# Patient Record
Sex: Female | Born: 1960 | Race: White | Hispanic: No | Marital: Married | Smoking: Never smoker
Health system: Southern US, Community
[De-identification: ages and names within clinical notes are randomized; demographics above are authoritative.]

## PROBLEM LIST (undated history)

## (undated) DIAGNOSIS — F329 Major depressive disorder, single episode, unspecified: Secondary | ICD-10-CM

## (undated) DIAGNOSIS — R7303 Prediabetes: Secondary | ICD-10-CM

## (undated) DIAGNOSIS — J45909 Unspecified asthma, uncomplicated: Secondary | ICD-10-CM

## (undated) DIAGNOSIS — F32A Depression, unspecified: Secondary | ICD-10-CM

## (undated) DIAGNOSIS — I509 Heart failure, unspecified: Secondary | ICD-10-CM

## (undated) DIAGNOSIS — E039 Hypothyroidism, unspecified: Secondary | ICD-10-CM

## (undated) DIAGNOSIS — C801 Malignant (primary) neoplasm, unspecified: Secondary | ICD-10-CM

## (undated) DIAGNOSIS — F419 Anxiety disorder, unspecified: Secondary | ICD-10-CM

## (undated) DIAGNOSIS — I10 Essential (primary) hypertension: Secondary | ICD-10-CM

## (undated) DIAGNOSIS — K635 Polyp of colon: Secondary | ICD-10-CM

## (undated) DIAGNOSIS — E079 Disorder of thyroid, unspecified: Secondary | ICD-10-CM

## (undated) DIAGNOSIS — Z9889 Other specified postprocedural states: Secondary | ICD-10-CM

## (undated) DIAGNOSIS — E209 Hypoparathyroidism, unspecified: Secondary | ICD-10-CM

## (undated) DIAGNOSIS — M797 Fibromyalgia: Secondary | ICD-10-CM

## (undated) DIAGNOSIS — D509 Iron deficiency anemia, unspecified: Secondary | ICD-10-CM

## (undated) DIAGNOSIS — F319 Bipolar disorder, unspecified: Secondary | ICD-10-CM

## (undated) DIAGNOSIS — Z8489 Family history of other specified conditions: Secondary | ICD-10-CM

## (undated) DIAGNOSIS — R0600 Dyspnea, unspecified: Secondary | ICD-10-CM

## (undated) DIAGNOSIS — R112 Nausea with vomiting, unspecified: Secondary | ICD-10-CM

## (undated) DIAGNOSIS — R9431 Abnormal electrocardiogram [ECG] [EKG]: Secondary | ICD-10-CM

## (undated) DIAGNOSIS — E538 Deficiency of other specified B group vitamins: Secondary | ICD-10-CM

## (undated) DIAGNOSIS — Z8719 Personal history of other diseases of the digestive system: Secondary | ICD-10-CM

## (undated) DIAGNOSIS — R590 Localized enlarged lymph nodes: Secondary | ICD-10-CM

## (undated) DIAGNOSIS — R918 Other nonspecific abnormal finding of lung field: Secondary | ICD-10-CM

## (undated) DIAGNOSIS — Z85820 Personal history of malignant melanoma of skin: Secondary | ICD-10-CM

## (undated) HISTORY — PX: AUGMENTATION MAMMAPLASTY: SUR837

## (undated) HISTORY — DX: Major depressive disorder, single episode, unspecified: F32.9

## (undated) HISTORY — DX: Depression, unspecified: F32.A

## (undated) HISTORY — DX: Heart failure, unspecified: I50.9

## (undated) HISTORY — PX: NASAL SINUS SURGERY: SHX719

## (undated) HISTORY — DX: Deficiency of other specified B group vitamins: E53.8

## (undated) HISTORY — PX: SKIN CANCER EXCISION: SHX779

## (undated) HISTORY — DX: Disorder of thyroid, unspecified: E07.9

## (undated) HISTORY — PX: APPENDECTOMY: SHX54

## (undated) HISTORY — DX: Malignant (primary) neoplasm, unspecified: C80.1

## (undated) HISTORY — DX: Anxiety disorder, unspecified: F41.9

## (undated) HISTORY — PX: BREAST ENHANCEMENT SURGERY: SHX7

## (undated) HISTORY — PX: HEMORRHOID SURGERY: SHX153

---

## 2004-05-18 ENCOUNTER — Ambulatory Visit: Payer: Self-pay | Admitting: Internal Medicine

## 2004-05-26 ENCOUNTER — Ambulatory Visit: Payer: Self-pay | Admitting: Internal Medicine

## 2004-09-12 ENCOUNTER — Ambulatory Visit: Payer: Self-pay | Admitting: Gastroenterology

## 2004-10-03 ENCOUNTER — Ambulatory Visit: Payer: Self-pay | Admitting: Obstetrics and Gynecology

## 2005-07-13 ENCOUNTER — Ambulatory Visit: Payer: Self-pay | Admitting: Surgery

## 2006-01-11 ENCOUNTER — Ambulatory Visit: Payer: Self-pay | Admitting: Obstetrics and Gynecology

## 2007-02-27 ENCOUNTER — Ambulatory Visit: Payer: Self-pay | Admitting: Obstetrics and Gynecology

## 2007-10-11 ENCOUNTER — Ambulatory Visit: Payer: Self-pay | Admitting: Unknown Physician Specialty

## 2008-04-02 ENCOUNTER — Ambulatory Visit: Payer: Self-pay | Admitting: Internal Medicine

## 2008-05-19 ENCOUNTER — Ambulatory Visit: Payer: Self-pay | Admitting: Unknown Physician Specialty

## 2008-07-17 DIAGNOSIS — Z8614 Personal history of Methicillin resistant Staphylococcus aureus infection: Secondary | ICD-10-CM

## 2008-07-17 HISTORY — DX: Personal history of Methicillin resistant Staphylococcus aureus infection: Z86.14

## 2009-11-01 ENCOUNTER — Ambulatory Visit: Payer: Self-pay | Admitting: Gastroenterology

## 2010-07-17 HISTORY — PX: ANKLE SURGERY: SHX546

## 2010-12-06 ENCOUNTER — Ambulatory Visit: Payer: Self-pay

## 2011-05-10 ENCOUNTER — Ambulatory Visit: Payer: Self-pay

## 2011-05-10 ENCOUNTER — Ambulatory Visit: Payer: Self-pay | Admitting: Podiatry

## 2011-05-17 ENCOUNTER — Ambulatory Visit: Payer: Self-pay | Admitting: Podiatry

## 2011-08-28 ENCOUNTER — Ambulatory Visit: Payer: Self-pay | Admitting: Family Medicine

## 2011-08-28 ENCOUNTER — Ambulatory Visit: Payer: Self-pay | Admitting: Surgery

## 2011-08-28 LAB — CBC WITH DIFFERENTIAL/PLATELET
Basophil %: 0 %
Eosinophil #: 0 10*3/uL (ref 0.0–0.7)
Eosinophil %: 0 %
HCT: 35.3 % (ref 35.0–47.0)
HGB: 12.1 g/dL (ref 12.0–16.0)
Lymphocyte %: 3.4 %
MCHC: 34.4 g/dL (ref 32.0–36.0)
Monocyte #: 0.4 10*3/uL (ref 0.0–0.7)
Neutrophil %: 92 %
RBC: 3.75 10*6/uL — ABNORMAL LOW (ref 3.80–5.20)
WBC: 9 10*3/uL (ref 3.6–11.0)

## 2011-08-28 LAB — URINALYSIS, COMPLETE
Bacteria: NONE SEEN
Bilirubin,UR: NEGATIVE
Blood: NEGATIVE
Glucose,UR: NEGATIVE mg/dL (ref 0–75)
RBC,UR: <1 /HPF (ref 0–5)
Squamous Epithelial: <1

## 2011-08-28 LAB — BASIC METABOLIC PANEL
Anion Gap: 10 (ref 7–16)
BUN: 11 mg/dL (ref 7–18)
Calcium, Total: 8.3 mg/dL — ABNORMAL LOW (ref 8.5–10.1)
Chloride: 99 mmol/L (ref 98–107)
EGFR (Non-African Amer.): >60
Glucose: 114 mg/dL — ABNORMAL HIGH (ref 65–99)
Osmolality: 265 (ref 275–301)

## 2011-08-29 LAB — BASIC METABOLIC PANEL
BUN: 8 mg/dL (ref 7–18)
Calcium, Total: 7.9 mg/dL — ABNORMAL LOW (ref 8.5–10.1)
Chloride: 101 mmol/L (ref 98–107)
Co2: 26 mmol/L (ref 21–32)
Creatinine: 0.61 mg/dL (ref 0.60–1.30)
EGFR (Non-African Amer.): >60
Osmolality: 271 (ref 275–301)
Potassium: 3.6 mmol/L (ref 3.5–5.1)
Sodium: 136 mmol/L (ref 136–145)

## 2011-08-29 LAB — CBC WITH DIFFERENTIAL/PLATELET
Basophil %: 0.1 %
Eosinophil %: 0 %
HGB: 12.2 g/dL (ref 12.0–16.0)
Lymphocyte %: 4.3 %
MCH: 32.4 pg (ref 26.0–34.0)
Monocyte #: 0.5 10*3/uL (ref 0.0–0.7)
Neutrophil %: 89.9 %
RBC: 3.75 10*6/uL — ABNORMAL LOW (ref 3.80–5.20)
RDW: 12.8 % (ref 11.5–14.5)

## 2011-09-14 ENCOUNTER — Ambulatory Visit: Payer: Self-pay | Admitting: Surgery

## 2011-09-14 LAB — CBC WITH DIFFERENTIAL/PLATELET
Basophil #: 0 10*3/uL (ref 0.0–0.1)
Eosinophil #: 0.2 10*3/uL (ref 0.0–0.7)
HCT: 38.4 % (ref 35.0–47.0)
Lymphocyte #: 1 10*3/uL (ref 1.0–3.6)
MCH: 31.8 pg (ref 26.0–34.0)
MCHC: 33.5 g/dL (ref 32.0–36.0)
MCV: 95 fL (ref 80–100)
Monocyte #: 0.4 10*3/uL (ref 0.0–0.7)
Monocyte %: 6.2 %
Neutrophil #: 5.1 10*3/uL (ref 1.4–6.5)
Platelet: 323 10*3/uL (ref 150–440)
RDW: 12.6 % (ref 11.5–14.5)

## 2011-09-14 LAB — BASIC METABOLIC PANEL
BUN: 18 mg/dL (ref 7–18)
Calcium, Total: 8.5 mg/dL (ref 8.5–10.1)
EGFR (Non-African Amer.): >60
Glucose: 103 mg/dL — ABNORMAL HIGH (ref 65–99)
Osmolality: 280 (ref 275–301)
Potassium: 4.1 mmol/L (ref 3.5–5.1)

## 2011-09-14 LAB — HEPATIC FUNCTION PANEL A (ARMC)
Albumin: 3.9 g/dL (ref 3.4–5.0)
Bilirubin, Direct: 0.1 mg/dL (ref 0.00–0.20)
Bilirubin,Total: 0.3 mg/dL (ref 0.2–1.0)
SGOT(AST): 29 U/L (ref 15–37)
Total Protein: 7.6 g/dL (ref 6.4–8.2)

## 2011-09-14 LAB — LIPASE, BLOOD: Lipase: 210 U/L (ref 73–393)

## 2011-10-30 ENCOUNTER — Ambulatory Visit: Payer: Self-pay | Admitting: Specialist

## 2012-02-29 ENCOUNTER — Ambulatory Visit: Payer: Self-pay | Admitting: Obstetrics and Gynecology

## 2013-01-23 ENCOUNTER — Ambulatory Visit: Payer: Self-pay | Admitting: Specialist

## 2013-01-28 ENCOUNTER — Telehealth: Payer: Self-pay | Admitting: Endocrinology

## 2013-01-28 NOTE — Telephone Encounter (Signed)
I do not have the report available, please let me know when previous records come over, when his her next appointment? She will need BMP and vitamin D levels

## 2013-01-28 NOTE — Telephone Encounter (Signed)
Needs bone density results, done in June. Would also like order mailed to her for labs, she can get them done at a [facility] near her home. Please call # [phone]. Thanks! Sherri

## 2013-01-28 NOTE — Telephone Encounter (Signed)
Dr. Kumar please advise. 

## 2013-01-29 NOTE — Telephone Encounter (Signed)
Pt has not signed a release form yet, I gave her the number to have one sent to her so we can get her records, she has an appt scheduled with you on July 29th,  She wanted lab orders sent to her so she can go to [facility], I told her we needed her previous records before we can order those.

## 2013-01-30 ENCOUNTER — Ambulatory Visit: Payer: Self-pay | Admitting: Specialist

## 2013-02-11 ENCOUNTER — Ambulatory Visit: Payer: Self-pay | Admitting: Endocrinology

## 2013-02-11 ENCOUNTER — Other Ambulatory Visit: Payer: Self-pay | Admitting: Endocrinology

## 2013-02-11 NOTE — Telephone Encounter (Signed)
Pharmacy requests refill of Calcitriol 0.25 mg, okay to fill?

## 2013-02-14 ENCOUNTER — Encounter: Payer: Self-pay | Admitting: Endocrinology

## 2013-02-14 ENCOUNTER — Ambulatory Visit (INDEPENDENT_AMBULATORY_CARE_PROVIDER_SITE_OTHER): Payer: BC Managed Care – PPO | Admitting: Endocrinology

## 2013-02-14 VITALS — BP 110/78 | HR 97 | Temp 98.0°F | Resp 10 | Ht 64.0 in | Wt 159.6 lb

## 2013-02-14 DIAGNOSIS — E209 Hypoparathyroidism, unspecified: Secondary | ICD-10-CM

## 2013-02-14 LAB — BASIC METABOLIC PANEL
BUN: 16 mg/dL (ref 6–23)
Calcium: 8.6 mg/dL (ref 8.4–10.5)
Chloride: 103 mEq/L (ref 96–112)
Creatinine, Ser: 0.9 mg/dL (ref 0.4–1.2)
GFR: 73.24 mL/min (ref >60.00)

## 2013-02-14 NOTE — Progress Notes (Signed)
Patient ID: Kaylee Gordon, female   DOB: 27-Nov-1960, 52 y.o.   MRN: [id number]  History of Present Illness:  Chief complaint: Followup of Hypoparathyroidism    Has had hypocalcemia since she was 52 years old and presented with symptoms of cramping in her hands along with twitching of her face and eyes. She was diagnosed at [facility] as having primary idiopathic hypoparathyroidism.   She has not been seen in followup since 10/2012  The patient is complaining of tiredness which is not new.  On her last visit she had been noncompliant with her calcium and calcitriol and her calcium level was 7.7. Did not have any muscle cramps though.  Her last visit she thinks she has been more compliant with her calcitriol daily and also her calcium 2400 mg. She has less constipation with current regimen of calcium, previously was taking 3600 mg  Problem 2: She was concerned about her having shrunk 2 inches in height and a bone density was checked. However she has only mild osteopenia with T score -1.2 at the right hip    Medication List       This list is accurate as of: 02/14/13 11:59 PM.  Always use your most recent med list.               calcitRIOL 0.25 MCG capsule  Commonly known as:  ROCALTROL  0.25 mcg.     chlorpheniramine-HYDROcodone 10-8 MG/5ML Lqcr  Commonly known as:  TUSSIONEX     diazepam 5 MG tablet  Commonly known as:  VALIUM  5 mg.     fluticasone 50 MCG/ACT nasal spray  Commonly known as:  FLONASE     HYDROcodone-acetaminophen 5-325 MG per tablet  Commonly known as:  NORCO/VICODIN     levothyroxine 50 MCG tablet  Commonly known as:  SYNTHROID, LEVOTHROID  Take 50 mcg by mouth daily before breakfast.     QUEtiapine 25 MG tablet  Commonly known as:  SEROQUEL  25 mg.     ranitidine 150 MG tablet  Commonly known as:  ZANTAC  150 mg.     venlafaxine XR 75 MG 24 hr capsule  Commonly known as:  EFFEXOR-XR  75 mg.        Allergies:  Allergies  Allergen  Reactions  . Biaxin (Clarithromycin)   . Parafon Forte Dsc (Chlorzoxazone)   . Sudafed (Pseudoephedrine Hcl)     No past medical history on file.  Past Surgical History  Procedure Laterality Date  . Appendectomy    . Breast enhancement surgery      Family History  Problem Relation Age of Onset  . Cancer Mother   . Heart disease Father   . Hypoparathyroidism Son     Social History:  reports that she has never smoked. She does not have any smokeless tobacco history on file. Her alcohol and drug histories are not on file.  Review of Systems -   She has a history of hypertension Previously was having hypokalemia and she thinks HCTZ was stopped, now off medications History of depression Has history of allergic rhinitis Has history of asthma ? Hypothyroidism: Her TSH has been normal with stopping her thyroid supplement. Not clear if she was started on thyroxine supplements with a borderline TSH levels about 2 years ago  Office Visit on 02/14/2013  Component Date Value Range Status  . Sodium 02/14/2013 136  135 - 145 mEq/L Final  . Potassium 02/14/2013 3.5  3.5 - 5.1 mEq/L Final  .  Chloride 02/14/2013 103  96 - 112 mEq/L Final  . CO2 02/14/2013 26  19 - 32 mEq/L Final  . Glucose, Bld 02/14/2013 100* 70 - 99 mg/dL Final  . BUN 96/10/5407 16  6 - 23 mg/dL Final  . Creatinine, Ser 02/14/2013 0.9  0.4 - 1.2 mg/dL Final  . Calcium 81/19/1478 8.6  8.4 - 10.5 mg/dL Final  . GFR 29/56/2130 73.24  >60.00 mL/min Final    EXAM:  BP 110/78  Pulse 97  Temp(Src) 98 F (36.7 C)  Resp 10  Ht 5\' 4"  (1.626 m)  Wt 159 lb 9.6 oz (72.394 kg)  BMI 27.38 kg/m2  SpO2 95%  LMP 02/05/2013  Assessment/Plan:   HYPOPARATHYROIDISM: Her calcium appears to be back to normal and she is not having the symptoms with better compliance with her calcitriol daily and calcium supplements Emphasized the  need for consistent compliance  OSTEOPENIA: This is mild and does not need any specific treatment,  she is going to be on calcium and vitamin D supplements anyway  Augustine Brannick 02/17/2013, 2:38 PM

## 2013-02-14 NOTE — Patient Instructions (Addendum)
Taka calcium 2x daily

## 2013-02-17 ENCOUNTER — Encounter: Payer: Self-pay | Admitting: Endocrinology

## 2013-02-17 ENCOUNTER — Telehealth: Payer: Self-pay | Admitting: *Deleted

## 2013-02-17 MED ORDER — CALCITRIOL 0.25 MCG PO CAPS: 0.2500 ug | ORAL_CAPSULE | Freq: Every day | ORAL | Status: DC

## 2013-02-17 NOTE — Telephone Encounter (Signed)
Message copied by Hermenia Bers on Mon Feb 17, 2013  9:15 AM ------      Message from: Reather Littler      Created: Sat Feb 15, 2013  6:42 PM       Normal ------

## 2013-02-17 NOTE — Telephone Encounter (Signed)
Home number is wrong, left message on cell phone to return call

## 2013-02-17 NOTE — Telephone Encounter (Signed)
Pt is aware of results. 

## 2013-02-17 NOTE — Telephone Encounter (Signed)
Message copied by Hermenia Bers on Mon Feb 17, 2013 11:52 AM ------      Message from: Reather Littler      Created: Sat Feb 15, 2013  6:42 PM       Normal ------

## 2013-02-20 ENCOUNTER — Ambulatory Visit: Payer: Self-pay | Admitting: Internal Medicine

## 2013-05-06 ENCOUNTER — Ambulatory Visit: Payer: Self-pay | Admitting: Family Medicine

## 2013-08-12 ENCOUNTER — Other Ambulatory Visit: Payer: Self-pay | Admitting: Endocrinology

## 2013-08-20 ENCOUNTER — Ambulatory Visit: Payer: BC Managed Care – PPO | Admitting: Endocrinology

## 2013-09-09 ENCOUNTER — Other Ambulatory Visit: Payer: Self-pay | Admitting: Endocrinology

## 2013-10-01 ENCOUNTER — Ambulatory Visit: Payer: BC Managed Care – PPO | Admitting: Endocrinology

## 2013-10-13 ENCOUNTER — Other Ambulatory Visit: Payer: Self-pay | Admitting: Endocrinology

## 2013-10-16 ENCOUNTER — Encounter: Payer: Self-pay | Admitting: Endocrinology

## 2013-10-16 ENCOUNTER — Ambulatory Visit (INDEPENDENT_AMBULATORY_CARE_PROVIDER_SITE_OTHER): Payer: BC Managed Care – PPO | Admitting: Endocrinology

## 2013-10-16 VITALS — BP 128/78 | HR 105 | Temp 97.8°F | Resp 16 | Ht 64.0 in | Wt 162.8 lb

## 2013-10-16 DIAGNOSIS — E209 Hypoparathyroidism, unspecified: Secondary | ICD-10-CM

## 2013-10-16 DIAGNOSIS — E7800 Pure hypercholesterolemia, unspecified: Secondary | ICD-10-CM

## 2013-10-16 NOTE — Patient Instructions (Signed)
Same doses

## 2013-10-16 NOTE — Progress Notes (Addendum)
Patient ID: Kaylee Gordon, female   DOB: Mar 04, 1961, 53 y.o.   MRN: [id number]   History of Present Illness:  Chief complaint: Followup of Hypoparathyroidism    Has had hypocalcemia since she was 53 years old and presented with symptoms of cramping in her hands along with twitching of her face and eyes. She was diagnosed at [facility] as having primary idiopathic hypoparathyroidism.   She has not been seen in followup since 02/2013  The patient is not complaining of any tingling in her face, muscle cramping or twitching of muscles. Has occasional tiredness which is not new.  Since her last visit she had been much better with her calcium and calcitriol and she feels better with consistent regimen She thinks her calcium was checked by her PCP about 2 weeks ago and the level was about 8.8 Unable to get her records today  Since her last visit she is taking calcitriol 0.25 mcg daily and also her calcium 1200 mg. twice a day      Medication List       This list is accurate as of: 10/16/13 11:59 PM.  Always use your most recent med list.               amoxicillin 500 MG capsule  Commonly known as:  AMOXIL  Take 500 mg by mouth 3 (three) times daily.     calcitRIOL 0.25 MCG capsule  Commonly known as:  ROCALTROL  TAKE 1 CAPSULE (0.25 MCG TOTAL) BY MOUTH DAILY.     chlorpheniramine-HYDROcodone 10-8 MG/5ML Lqcr  Commonly known as:  TUSSIONEX     diazepam 5 MG tablet  Commonly known as:  VALIUM  5 mg.     fluticasone 50 MCG/ACT nasal spray  Commonly known as:  FLONASE     HYDROcodone-acetaminophen 5-325 MG per tablet  Commonly known as:  NORCO/VICODIN     meloxicam 15 MG tablet  Commonly known as:  MOBIC     QUEtiapine 25 MG tablet  Commonly known as:  SEROQUEL  25 mg.     ranitidine 150 MG tablet  Commonly known as:  ZANTAC  150 mg.     venlafaxine XR 75 MG 24 hr capsule  Commonly known as:  EFFEXOR-XR  75 mg.     ZOSTAVAX 40981 UNT/0.65ML injection   Generic drug:  zoster vaccine live (PF)        Allergies:  Allergies  Allergen Reactions  . Biaxin [Clarithromycin]   . Parafon Forte Dsc [Chlorzoxazone]   . Sudafed [Pseudoephedrine Hcl]     No past medical history on file.  Past Surgical History  Procedure Laterality Date  . Appendectomy    . Breast enhancement surgery      Family History  Problem Relation Age of Onset  . Cancer Mother   . Heart disease Father   . Hypoparathyroidism Son     Social History:  reports that she has never smoked. She does not have any smokeless tobacco history on file. Her alcohol and drug histories are not on file.  Review of Systems -   She has a history of hypertension but is currently not taking any medications; Previously was having hypokalemia and HCTZ was stopped History of depression Menopause: she has only mild osteopenia with T score -1.2 at the right hip  ? Hypothyroidism: Her TSH has been normal with stopping her thyroid supplement.  Not clear if she was started on thyroxine supplements with a borderline TSH levels about 3 years  ago  HYPERCHOLESTEROLEMIA: She thinks her LDL was about 195. However had severe fatigue with Lipitor and has not taken any medications recently  LABS: Results pending   EXAM:  BP 128/78  Pulse 105  Temp(Src) 97.8 F (36.6 C)  Resp 16  Ht 5\' 4"  (1.626 m)  Wt 162 lb 12.8 oz (73.846 kg)  BMI 27.93 kg/m2  SpO2 98%  Negative [place]  Sign  Biceps reflexes are normal Neck exam shows no thyroid enlargement  Assessment/Plan:   HYPOPARATHYROIDISM: Her calcium appears to be back to normal although do not have her recent lab reports Currently she is not having the usual symptoms with better compliance with her calcitriol daily and calcium supplements Emphasized the  need for consistent compliance and followup in 6 months   Osborn Pullin 10/19/2013, 3:05 PM   Addendum: Labs received show calcium 8.8 and potassium 4.2 drawn on 09/29/13. LDL  168

## 2013-10-19 NOTE — Addendum Note (Signed)
Addended by: Elayne Snare on: 10/19/2013 03:12 PM   Modules accepted: Orders

## 2014-01-11 ENCOUNTER — Other Ambulatory Visit: Payer: Self-pay | Admitting: Endocrinology

## 2014-04-20 ENCOUNTER — Other Ambulatory Visit: Payer: BC Managed Care – PPO

## 2014-04-20 ENCOUNTER — Ambulatory Visit: Payer: BC Managed Care – PPO | Admitting: Endocrinology

## 2014-05-06 ENCOUNTER — Ambulatory Visit (INDEPENDENT_AMBULATORY_CARE_PROVIDER_SITE_OTHER): Payer: BC Managed Care – PPO | Admitting: Endocrinology

## 2014-05-06 ENCOUNTER — Other Ambulatory Visit: Payer: BC Managed Care – PPO

## 2014-05-06 ENCOUNTER — Encounter: Payer: Self-pay | Admitting: Endocrinology

## 2014-05-06 VITALS — BP 123/75 | HR 96 | Temp 97.8°F | Resp 14 | Ht 64.0 in | Wt 165.0 lb

## 2014-05-06 DIAGNOSIS — E209 Hypoparathyroidism, unspecified: Secondary | ICD-10-CM

## 2014-05-06 DIAGNOSIS — R5383 Other fatigue: Secondary | ICD-10-CM

## 2014-05-06 DIAGNOSIS — E038 Other specified hypothyroidism: Secondary | ICD-10-CM

## 2014-05-06 DIAGNOSIS — E7800 Pure hypercholesterolemia, unspecified: Secondary | ICD-10-CM

## 2014-05-06 NOTE — Progress Notes (Signed)
Patient ID: Kaylee Gordon, female   DOB: Aug 04, 1960, 53 y.o.   MRN: [id number]   History of Present Illness:  Chief complaint: Followup of Hypoparathyroidism    Has had hypocalcemia since she was 53 years old and presented with symptoms of cramping in her hands along with twitching of her face and eyes. She was diagnosed at [facility] as having primary idiopathic hypoparathyroidism.   She has not been seen in followup since 02/2013  The patient is not complaining of any tingling in her face, muscle cramping or twitching of muscles. Has continued tiredness which is not new.  In 2015 she had been much better with taking her calcium and calcitriol every day and she feels better with the consistent regimen She thinks her calcium was checked by her PCP about 3 weeks ago and the level was 9.6 compared to 8.8 previously  Since her last visit she is taking calcitriol 0.25 mcg daily and also her calcium 1200 mg. twice a day      Medication List       This list is accurate as of: 05/06/14  4:47 PM.  Always use your most recent med list.               calcitRIOL 0.25 MCG capsule  Commonly known as:  ROCALTROL  TAKE 1 CAPSULE (0.25 MCG TOTAL) BY MOUTH DAILY.     celecoxib 200 MG capsule  Commonly known as:  CELEBREX  Take 200 mg by mouth 2 (two) times daily.     diazepam 5 MG tablet  Commonly known as:  VALIUM  5 mg.     fluticasone 50 MCG/ACT nasal spray  Commonly known as:  FLONASE     QUEtiapine 25 MG tablet  Commonly known as:  SEROQUEL  25 mg.     venlafaxine XR 75 MG 24 hr capsule  Commonly known as:  EFFEXOR-XR  75 mg.     ZOSTAVAX 09326 UNT/0.65ML injection  Generic drug:  zoster vaccine live (PF)        Allergies:  Allergies  Allergen Reactions  . Biaxin [Clarithromycin]   . Parafon Forte Dsc [Chlorzoxazone]   . Sudafed [Pseudoephedrine Hcl]     No past medical history on file.  Past Surgical History  Procedure Laterality Date  . Appendectomy     . Breast enhancement surgery      Family History  Problem Relation Age of Onset  . Cancer Mother   . Heart disease Father   . Hypoparathyroidism Son     Social History:  reports that she has never smoked. She does not have any smokeless tobacco history on file. Her alcohol and drug histories are not on file.  Review of Systems -   She has a history of hypertension and is currently low dose lisinopril Previously was having hypokalemia and HCTZ was stopped History of depression, is on Seroquel low dose  Menopause: she had a normal FSH, since she had an endometrial ablation she does not know if he is having any instilled cycles  History of only mild osteopenia with T score -1.2 at the right hip  ? Hypothyroidism: Her TSH had been normal with stopping her thyroid supplement.  She was tried on thyroxine supplements with a borderline TSH levels in 2011  HYPERCHOLESTEROLEMIA:  She thinks her baseline LDL was about 195. However had severe fatigue with Lipitor and recent LDL is below 100 with taking low dose lovastatin   EXAM:  BP 123/75  Pulse  96  Temp(Src) 97.8 F (36.6 C)  Resp 14  Ht 5\' 4"  (1.626 m)  Wt 165 lb (74.844 kg)  BMI 28.31 kg/m2  SpO2 98%   Biceps reflexes are normal Neck exam shows no thyroid enlargement  Assessment/Plan:   HYPOPARATHYROIDISM: Her calcium is excellent now with current regimen of 0.25 mcg calcitriol and calcium 1200 mg a day She has been very consistent with her medication and is asymptomatic She will continue the same dose and followup in 6 months   Tayvian Holycross 05/06/2014, 4:47 PM   Addendum: Free T4 is low at 0.53, 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

## 2014-05-07 ENCOUNTER — Other Ambulatory Visit: Payer: Self-pay | Admitting: *Deleted

## 2014-05-07 LAB — T4, FREE: Free T4: 0.53 ng/dL — ABNORMAL LOW (ref 0.60–1.60)

## 2014-05-07 LAB — TSH: TSH: 1.7 u[IU]/mL (ref 0.35–4.50)

## 2014-05-07 MED ORDER — LEVOTHYROXINE SODIUM 25 MCG PO TABS: 25.0000 ug | ORAL_TABLET | Freq: Every day | ORAL | Status: DC

## 2014-05-07 NOTE — Progress Notes (Signed)
Quick Note:  The thyroid level is mildly decreased but this is coming from the pituitary gland. Would like to try her on Synthroid 25 mcg daily and have her come back in 2 months; same-day lab is okay ______

## 2014-05-13 ENCOUNTER — Ambulatory Visit: Payer: Self-pay | Admitting: Obstetrics and Gynecology

## 2014-06-15 ENCOUNTER — Other Ambulatory Visit: Payer: Self-pay | Admitting: Endocrinology

## 2014-07-01 ENCOUNTER — Ambulatory Visit: Payer: BC Managed Care – PPO | Admitting: Endocrinology

## 2014-07-06 ENCOUNTER — Other Ambulatory Visit: Payer: Self-pay | Admitting: Endocrinology

## 2014-07-22 ENCOUNTER — Encounter: Payer: Self-pay | Admitting: Endocrinology

## 2014-07-22 ENCOUNTER — Ambulatory Visit (INDEPENDENT_AMBULATORY_CARE_PROVIDER_SITE_OTHER): Payer: BC Managed Care – PPO | Admitting: Endocrinology

## 2014-07-22 VITALS — BP 124/81 | HR 100 | Temp 98.7°F | Resp 14 | Ht 64.0 in | Wt 169.2 lb

## 2014-07-22 DIAGNOSIS — N912 Amenorrhea, unspecified: Secondary | ICD-10-CM

## 2014-07-22 DIAGNOSIS — E209 Hypoparathyroidism, unspecified: Secondary | ICD-10-CM

## 2014-07-22 DIAGNOSIS — E038 Other specified hypothyroidism: Secondary | ICD-10-CM

## 2014-07-22 NOTE — Progress Notes (Addendum)
Patient ID: Kaylee Gordon, female   DOB: 02/18/1961, 54 y.o.   MRN: [id number]   History of Present Illness:  Chief complaint: Followup of thyroid   ? Hypothyroidism: Her TSH had been normal with stopping her thyroid supplement.  She was tried on thyroxine supplements with a borderline TSH levels in 2011 However because of complaints of some effusion fatigue on her visit in 10/15 she had thyroid levels done and free T4 was significantly low She was empirically started on 25 g  of levothyroxine She thinks her energy level is a little better but she still tends to get cold easily. No difficulties with hair loss or difficulty with concentration or memory  Also does not complain of any visual difficulties.  Has only some tension headaches  Wt Readings from Last 3 Encounters:  07/22/14 169 lb 3.2 oz (76.749 kg)  05/06/14 165 lb (74.844 kg)  10/16/13 162 lb 12.8 oz (73.846 kg)    Lab Results  Component Value Date   FREET4 0.53* 05/06/2014   TSH 1.70 05/06/2014     Hypoparathyroidism    Has had hypocalcemia since she was 54 years old and presented with symptoms of cramping in her hands along with twitching of her face and eyes. She was diagnosed at [facility] as having primary idiopathic hypoparathyroidism.    No recent symptoms of  tingling in her face, muscle cramping or twitching of muscles. Has continued tiredness which is not new.  In 2015 she had been much better with taking her calcium and calcitriol every day and she feels better with the consistent regimen  Since her last visit she is taking calcitriol 0.25 mcg daily and also her calcium 1200 mg. twice a day Her calcium levels have been more recently stable, the last level was 9.6 in 10/15  Lab Results  Component Value Date   CALCIUM 8.6 02/14/2013         Medication List       This list is accurate as of: 07/22/14  4:34 PM.  Always use your most recent med list.               calcitRIOL 0.25 MCG  capsule  Commonly known as:  ROCALTROL  TAKE 1 CAPSULE BY MOUTH DAILY.     celecoxib 200 MG capsule  Commonly known as:  CELEBREX  Take 200 mg by mouth 2 (two) times daily.     diazepam 5 MG tablet  Commonly known as:  VALIUM  5 mg.     fluticasone 50 MCG/ACT nasal spray  Commonly known as:  FLONASE     levothyroxine 25 MCG tablet  Commonly known as:  SYNTHROID, LEVOTHROID  TAKE 1 TABLET (25 MCG TOTAL) BY MOUTH DAILY BEFORE BREAKFAST.     lovastatin 20 MG tablet  Commonly known as:  MEVACOR  Take 20 mg by mouth at bedtime.     QUEtiapine 25 MG tablet  Commonly known as:  SEROQUEL  25 mg.     venlafaxine XR 75 MG 24 hr capsule  Commonly known as:  EFFEXOR-XR  75 mg.     WELLBUTRIN 100 MG tablet  Generic drug:  buPROPion  Take 100 mg by mouth 2 (two) times daily.     ZOSTAVAX 44034 UNT/0.65ML injection  Generic drug:  zoster vaccine live (PF)        Allergies:  Allergies  Allergen Reactions  . Biaxin [Clarithromycin]   . Parafon Forte Dsc [Chlorzoxazone]   . Sudafed [Pseudoephedrine Hcl]  No past medical history on file.  Past Surgical History  Procedure Laterality Date  . Appendectomy    . Breast enhancement surgery      Family History  Problem Relation Age of Onset  . Cancer Mother   . Heart disease Father   . Hypoparathyroidism Son     Social History:  reports that she has never smoked. She does not have any smokeless tobacco history on file. Her alcohol and drug histories are not on file.  Review of Systems -   She has a history of hypertension and is recently not on  lisinopril Previously was having hypokalemia and HCTZ was stopped  Menopause: she had a normal FSH, since she had an endometrial ablation she does not know if he is having any cycles; no hot flashes Her gynecologist is Dr. Anders Simmonds  History of only mild osteopenia with T score -1.2 at the right hip  History of depression, is on Seroquel low dose and recently starting  Wellbutrin  HYPERCHOLESTEROLEMIA:  She thinks her baseline LDL was about 195. However had severe fatigue with Lipitor and recent LDL is below 100 with taking low dose lovastatin   EXAM:  BP 124/81 mmHg  Pulse 100  Temp(Src) 98.7 F (37.1 C)  Resp 14  Ht 5\' 4"  (1.626 m)  Wt 169 lb 3.2 oz (76.749 kg)  BMI 29.03 kg/m2  SpO2 97%   Thyroid not palpable. No alopecia present No swelling of her hands or feet. 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  She is subjectively slightly better with taking 25 g of levothyroxine since 10/15 Clinically she does not look hypothyroid; she has had other issues with depression also Will check her free T4 level today and decide on further dose adjustment  Also to evaluate her pituitary will check prolactin and estradiol level, most likely she has had a normal [place] from her gynecologist despite her being 54 years old.  Discussed possibility of hypopituitarism Currently not symptomatic with hot flushes also To consider HRT if she develops worsening of osteopenia  She will follow-up in 3 months again  HYPOPARATHYROIDISM: Her calcium has been well controlled current regimen of 0.25 mcg calcitriol and calcium 1200 mg a day She has been very consistent with her medication and is asymptomatic She will have labs checked today again   [facility] 07/22/2014, 4:34 PM    Addendum: Thyroid level still low, increase levothyroxine to 75 g. Change follow-up to 2 months instead of 3 Calcium normal at 8.6  Office Visit on 07/22/2014  Component Date Value Ref Range Status  . TSH 07/22/2014 2.64  0.35 - 4.50 uIU/mL Final  . Free T4 07/22/2014 0.59* 0.60 - 1.60 ng/dL Final  . Sodium 07/22/2014 136  135 - 145 mEq/L Final  . Potassium 07/22/2014 4.3  3.5 - 5.1 mEq/L Final  . Chloride 07/22/2014 102  96 - 112 mEq/L Final  . CO2 07/22/2014 25  19  - 32 mEq/L Final  . Calcium 07/22/2014 8.6  8.4 - 10.5 mg/dL Final  . Albumin 07/22/2014 4.7  3.5 - 5.2 g/dL Final  . BUN 07/22/2014 17  6 - 23 mg/dL Final  . Creatinine, Ser 07/22/2014 0.9  0.4 - 1.2 mg/dL Final  . Glucose, Bld 07/22/2014 84  70 - 99 mg/dL Final  . Phosphorus 07/22/2014 3.5  2.3 - 4.6 mg/dL Final  . GFR 07/22/2014 68.25  >  60.00 mL/min Final  . Prolactin 07/22/2014 18.3  4.8 - 23.3 ng/mL Final  . Estradiol, Serum, MS 07/22/2014 WILL FOLLOW   Preliminary  . Free Estradiol, Percent 07/22/2014 WILL FOLLOW   Preliminary  . Free Estradiol, Serum 07/22/2014 WILL FOLLOW   Preliminary

## 2014-07-23 LAB — RENAL FUNCTION PANEL
Albumin: 4.7 g/dL (ref 3.5–5.2)
BUN: 17 mg/dL (ref 6–23)
CO2: 25 meq/L (ref 19–32)
Calcium: 8.6 mg/dL (ref 8.4–10.5)
Chloride: 102 mEq/L (ref 96–112)
Creatinine, Ser: 0.9 mg/dL (ref 0.4–1.2)
GFR: 68.25 mL/min (ref >60.00)
Glucose, Bld: 84 mg/dL (ref 70–99)
Phosphorus: 3.5 mg/dL (ref 2.3–4.6)
Potassium: 4.3 mEq/L (ref 3.5–5.1)
Sodium: 136 mEq/L (ref 135–145)

## 2014-07-23 LAB — T4, FREE: FREE T4: 0.59 ng/dL — AB (ref 0.60–1.60)

## 2014-07-23 LAB — TSH: TSH: 2.64 u[IU]/mL (ref 0.35–4.50)

## 2014-07-23 NOTE — Progress Notes (Signed)
Quick Note:  Thyroid level still low, increase levothyroxine to 75 g. Change follow-up to 2 months instead of 3 Calcium normal at 8.6 ______

## 2014-07-24 ENCOUNTER — Other Ambulatory Visit: Payer: Self-pay | Admitting: *Deleted

## 2014-07-24 MED ORDER — LEVOTHYROXINE SODIUM 75 MCG PO TABS: 75.0000 ug | ORAL_TABLET | Freq: Every day | ORAL | Status: DC

## 2014-08-06 LAB — ESTRADIOL, FREE

## 2014-08-06 LAB — PROLACTIN: Prolactin: 18.3 ng/mL (ref 4.8–23.3)

## 2014-08-12 ENCOUNTER — Other Ambulatory Visit: Payer: Self-pay | Admitting: Endocrinology

## 2014-08-18 ENCOUNTER — Emergency Department: Payer: Self-pay | Admitting: Internal Medicine

## 2014-08-18 LAB — COMPREHENSIVE METABOLIC PANEL
ANION GAP: 10 (ref 7–16)
Albumin: 4 g/dL (ref 3.4–5.0)
Alkaline Phosphatase: 93 U/L (ref 46–116)
BILIRUBIN TOTAL: 0.2 mg/dL (ref 0.2–1.0)
BUN: 18 mg/dL (ref 7–18)
CO2: 25 mmol/L (ref 21–32)
Calcium, Total: 9.4 mg/dL (ref 8.5–10.1)
Chloride: 103 mmol/L (ref 98–107)
Creatinine: 0.89 mg/dL (ref 0.60–1.30)
EGFR (Non-African Amer.): >60
Glucose: 83 mg/dL (ref 65–99)
Osmolality: 277 (ref 275–301)
Potassium: 4.1 mmol/L (ref 3.5–5.1)
SGOT(AST): 27 U/L (ref 15–37)
SGPT (ALT): 33 U/L (ref 14–63)
Sodium: 138 mmol/L (ref 136–145)
TOTAL PROTEIN: 7.5 g/dL (ref 6.4–8.2)

## 2014-08-18 LAB — CBC
HCT: 36.7 % (ref 35.0–47.0)
HGB: 12.3 g/dL (ref 12.0–16.0)
MCH: 31.6 pg (ref 26.0–34.0)
MCHC: 33.6 g/dL (ref 32.0–36.0)
MCV: 94 fL (ref 80–100)
Platelet: 280 10*3/uL (ref 150–440)
RBC: 3.91 10*6/uL (ref 3.80–5.20)
RDW: 13.6 % (ref 11.5–14.5)
WBC: 10.2 10*3/uL (ref 3.6–11.0)

## 2014-08-18 LAB — URINALYSIS, COMPLETE
Bilirubin,UR: NEGATIVE
Blood: NEGATIVE
GLUCOSE, UR: NEGATIVE mg/dL (ref 0–75)
KETONE: NEGATIVE
LEUKOCYTE ESTERASE: NEGATIVE
Nitrite: NEGATIVE
Ph: 6 (ref 4.5–8.0)
Protein: NEGATIVE
RBC, UR: NONE SEEN /HPF (ref 0–5)
Specific Gravity: 1.009 (ref 1.003–1.030)
WBC UR: NONE SEEN /HPF (ref 0–5)

## 2014-08-18 LAB — TROPONIN I

## 2014-08-18 LAB — LIPASE, BLOOD: Lipase: 155 U/L (ref 73–393)

## 2014-08-18 LAB — D-DIMER(ARMC): D-Dimer: 385 ng/ml

## 2014-10-21 ENCOUNTER — Other Ambulatory Visit: Payer: Self-pay | Admitting: Endocrinology

## 2014-10-21 ENCOUNTER — Ambulatory Visit: Payer: Self-pay | Admitting: Endocrinology

## 2014-10-27 DIAGNOSIS — E7800 Pure hypercholesterolemia, unspecified: Secondary | ICD-10-CM | POA: Insufficient documentation

## 2014-10-27 DIAGNOSIS — J309 Allergic rhinitis, unspecified: Secondary | ICD-10-CM | POA: Insufficient documentation

## 2014-10-27 DIAGNOSIS — F411 Generalized anxiety disorder: Secondary | ICD-10-CM | POA: Insufficient documentation

## 2014-10-27 DIAGNOSIS — E039 Hypothyroidism, unspecified: Secondary | ICD-10-CM | POA: Insufficient documentation

## 2014-10-27 DIAGNOSIS — F331 Major depressive disorder, recurrent, moderate: Secondary | ICD-10-CM | POA: Insufficient documentation

## 2014-10-27 DIAGNOSIS — K635 Polyp of colon: Secondary | ICD-10-CM | POA: Insufficient documentation

## 2014-10-27 DIAGNOSIS — F319 Bipolar disorder, unspecified: Secondary | ICD-10-CM | POA: Insufficient documentation

## 2014-10-27 DIAGNOSIS — I10 Essential (primary) hypertension: Secondary | ICD-10-CM | POA: Insufficient documentation

## 2014-11-05 ENCOUNTER — Other Ambulatory Visit: Payer: BC Managed Care – PPO

## 2014-11-05 ENCOUNTER — Ambulatory Visit: Payer: BC Managed Care – PPO | Admitting: Endocrinology

## 2014-11-06 NOTE — Op Note (Signed)
PATIENT NAME:  Kaylee Gordon, Kaylee Gordon MR#:  [id number] DATE OF BIRTH:  Jul 17, 1961  DATE OF PROCEDURE:  01/30/2013  PREOPERATIVE DIAGNOSIS:  1.  Medial meniscus tear, left knee.  2.  Degenerative arthritis, medial compartment, left knee.   POSTOPERATIVE DIAGNOSES:  1.  Medial meniscus tear, left knee.  2.  Degenerative arthritis, medial compartment, left knee.  3.  Tear, lateral meniscus, left knee.   PROCEDURES:  1.  Arthroscopic partial medial meniscectomy, left knee.  2.  Arthroscopic partial lateral meniscectomy, left knee.   SURGEON: Christophe Louis, M.D.   ANESTHESIA: General.   COMPLICATIONS: None.   DESCRIPTION OF PROCEDURE: After adequate induction of general anesthesia, the left lower extremity is thoroughly prepped with alcohol and ChloraPrep and draped in standard sterile fashion after being secured in the leg holder for arthroscopy. The joint is infiltrated with 10 mL of Marcaine with epinephrine. Diagnostic arthroscopy is performed. There is no joint effusion present. The patellofemoral articulation looks quite good. There is a very minor medial plica present. In the medial compartment, there is a horizontal-type tear of the posterior horn of the medial meniscus. There is associated significant chondromalacia of the medial femoral condyle and tibial articular surfaces. Using a combination of the full radial resector and the ArthroWand, the torn portion of the medial meniscus is fully resected. In the intercondylar notch, the anterior cruciate ligament is normal. In the lateral compartment, there is seen to be a significant tear of the mid and anterior portions of the lateral meniscus. This is resected with a full radial resector and the ArthroWand. There is mild chondromalacia of the lateral compartment articular surface. The wound is thoroughly irrigated multiple times. Skin edges are closed with 5-0 nylon. The joint is infiltrated with 1 mL of Depo-Medrol and 15 mL of Marcaine with  epinephrine. A soft bulky dressing is applied. The patient is returned to the recovery room in satisfactory condition having tolerated the procedure quite well.   ____________________________ Lucas Mallow, MD ces:aw D: 01/31/2013 08:26:23 ET T: 01/31/2013 08:32:05 ET JOB#: [id number]  cc: Lucas Mallow, MD, <Dictator> Lucas Mallow MD ELECTRONICALLY SIGNED 02/01/2013 15:24

## 2014-11-08 NOTE — H&P (Signed)
PATIENT NAME:  Kaylee Gordon, Kaylee Gordon MR#:  [id number] DATE OF BIRTH:  1960-07-21  DATE OF ADMISSION:  08/28/2011  CHIEF COMPLAINT: Right lower quadrant pain.   HISTORY OF PRESENT ILLNESS: This is a patient whose pain started on Friday, three days ago. It has been worsening. She has pain in the right lower quadrant with nausea, vomiting, and fevers. She has had some chills. She has never had an episode like this before. She has had some loose stools, but that is common for her.   PAST MEDICAL HISTORY: Thyroid disease and hypoparathyroidism, discovered at age 54 when she was found to have lytic lesions. She is followed by Dr. Gabriel Carina for this unusual condition.   PAST SURGICAL HISTORY: Recent leg surgery. She has had two cesarean sections and a tubal ligation.   She denies any other medical problems.   FAMILY HISTORY: Colon cancer in her mother. The patient has had recent colonoscopy.   SOCIAL HISTORY: The patient is a high Education officer, museum. She does not smoke or drink.   REVIEW OF SYSTEMS: A 10 system review has been performed and negative with the exception of that mentioned in the history of present illness.   PHYSICAL EXAMINATION:   GENERAL: Healthy-appearing but very uncomfortable-appearing Caucasian female patient.   VITAL SIGNS: Temperature 101.8, pulse 123, respirations 22, blood pressure 132/66, pain scale 10, and 100% room air saturation.   HEENT: No scleral icterus.   NECK: No palpable neck nodes.   CHEST: Clear to auscultation.   CARDIAC: Regular rate and rhythm.   ABDOMEN: Abdomen is very tense, not quite rigid. She is very tender in the right lower quadrant with a positive Rovsing's sign. Rebound and percussion tenderness is present.   EXTREMITIES: No edema. Calves are nontender.   NEUROLOGIC: Grossly intact.   INTEGUMENT: No jaundice.   LABS/STUDIES: Labs are currently pending.   CT scan shows appendicitis.   ASSESSMENT AND PLAN: This is a patient with appendicitis,  both on physical examination, history, and on CT scan. Her labs are pending at this time, which will be checked preoperatively, but she requires surgery this evening. I discussed with her the fact that she has a temperature of 101.8 and possibly has either a gangrenous appendix or already ruptured appendix, but that is not clear at this time. It is unusual to have a high fever prior to rupturing or gangrenous appendicitis. The difference in course was discussed with her, and the risks of bleeding, infection, bowel injury, inability to proceed, conversion to an open procedure, and the need for further procedures if necessary. She understood and agreed to proceed. Her husband was present and had no questions. ____________________________ Jerrol Banana Burt Knack, MD rec:slb D: 08/28/2011 20:49:36 ET    T: 08/29/2011 07:14:49 ET       JOB#: [id number] cc: Jerrol Banana. Burt Knack, MD, <Dictator> Florene Glen MD ELECTRONICALLY SIGNED 09/01/2011 21:42

## 2014-11-08 NOTE — Discharge Summary (Signed)
PATIENT NAME:  SRISHTI, STRNAD MR#:  [id number] DATE OF BIRTH:  07/02/1961  DATE OF ADMISSION:  08/28/2011 DATE OF DISCHARGE:  08/30/2011  DISCHARGE DIAGNOSES:  1. Acute appendicitis.  2. Thyroid disease, hypoparathyroidism.   PROCEDURE: Laparoscopic appendectomy.   HISTORY OF PRESENT ILLNESS AND HOSPITAL COURSE: This is a patient with clear signs of right lower quadrant pain, tenderness and peritoneal irritation and a CT scan suggesting appendicitis. She also has mild leukocytosis and a low-grade temperature. She was taken to the Operating Room where laparoscopic appendectomy was performed. It was not ruptured but was acutely suppurative. Postoperatively she did well, was tolerating a regular diet and is currently discharged in stable condition on oral analgesics to resume her regular medications and follow up in our office in 10 days. She may shower and is on a regular diet.  ____________________________ Jerrol Banana. Burt Knack, MD rec:cms D: 09/06/2011 20:54:23 ET T: 09/07/2011 10:59:30 ET JOB#: [id number]  cc: Jerrol Banana. Burt Knack, MD, <Dictator> Florene Glen MD ELECTRONICALLY SIGNED 09/15/2011 11:30

## 2014-11-08 NOTE — H&P (Signed)
    Subjective/Chief Complaint RLQ pain    History of Present Illness 3 days of pain, fevers, nausea vomiting. No prior episode.    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, [employer]   Review of Systems:   Fever/Chills Yes    Cough No    Abdominal Pain Yes    Diarrhea Yes    Nausea/Vomiting Yes    SOB/DOE No    Chest Pain No    Dysuria No    Tolerating Diet No  Nauseated  Vomiting   Physical Exam:   GEN uncomfortable    HEENT pink conjunctivae    NECK supple    RESP normal resp effort  clear BS    CARD regular rate    ABD positive tenderness  rigid  pos Rosving's sign    SKIN normal to palpation    [facility] alert   Radiology Results: CT:    11-Feb-13 17:13, CT Abdomen and Pelvis With Contrast   CT Abdomen and Pelvis With Contrast   REASON FOR EXAM:    STAT CR [id number] PAGER Nausea rebound tenderness eval   for acute appendicitis  COMMENTS:       PROCEDURE: CT  - CT ABDOMEN / PELVIS  W  - Aug 28 2011  5:13PM     RESULT: CT of the abdomen and pelvis is performed utilizing 80 mL of   Isovue-300 iodinated intravenous contrast and oral contrast with images   reconstructed in the axial plane at 3.0 mm slice thickness. The patient   has no previous examination for comparison. Post contrast delayed images   are obtained.    The appendix appears to be distended 8.3 mm with periappendiceal   stranding best visualized between images 84 and 86 in the right lower   quadrant. No definite appendicolith is identified. There is no evidence     of perforation or abscess formation. No ascites is  evident. Small left   ovarian cystic area is demonstrated measuring 8.2 mm. Uterus and urinary   bladder appear unremarkable. No abnormal bowel distention is seen   otherwise. There is no other areas of bowel wall thickening. The liver,   spleen, kidneys, gallbladder, pancreas, adrenal glands and abdominal   aorta appear unremarkable. The lung bases appear clear.    IMPRESSION:   1. Findings consistent with acute appendicitis. Surgical consultation is   recommended. There is no evidence ofperforation or abscess formation at   this time.(*)          Verified By: Sundra Aland, M.D., MD     Assessment/Admission Diagnosis labs pending disc options risks in detail poss gangrenous with fevers agrees with plan   Electronic Signatures: Florene Glen (MD)  (Signed 11-Feb-13 20:46)  Authored: CHIEF COMPLAINT and HISTORY, PAST MEDICAL/SURGIAL HISTORY, ALLERGIES, FAMILY AND SOCIAL HISTORY, REVIEW OF SYSTEMS, PHYSICAL EXAM, Radiology, ASSESSMENT AND PLAN   Last Updated: 11-Feb-13 20:46 by Florene Glen (MD)

## 2014-11-08 NOTE — Op Note (Signed)
PATIENT NAME:  Kaylee Gordon, Kaylee Gordon MR#:  [id number] DATE OF BIRTH:  Jul 13, 1961  DATE OF PROCEDURE:  08/28/2011  PREOPERATIVE DIAGNOSIS: Acute appendicitis.   POSTOPERATIVE DIAGNOSIS: Acute appendicitis.   PROCEDURE: Laparoscopic appendectomy.   SURGEON: Jolie Strohecker E. Burt Knack, MD   ANESTHESIA: General with endotracheal tube.   INDICATIONS: This is a patient with progressive right lower quadrant pain and tenderness with signs of peritoneal irritation and CT findings suggestive of appendicitis. Preoperatively we discussed rationale for surgery, the options of observation, risks of bleeding, infection, recurrence of symptoms, failure to resolve her symptoms, open procedure, bowel injury. This was all reviewed for she and her husband in the preop holding area and in the ER. They understood and agreed to proceed.   FINDINGS: Acute appendicitis, suppurative, nonruptured.   DESCRIPTION OF PROCEDURE: The patient was induced to general anesthesia. Foley catheter was placed. VTE prophylaxis was in place. She was prepped and draped in a sterile fashion.   The patient was prepped and draped in a sterile fashion. Marcaine was infiltrated in skin and subcutaneous tissues around the periumbilical area. An incision was made. Veress needle was placed. Pneumoperitoneum was obtained. 5 mm trocar port was placed. The abdominal cavity was explored and under direct vision a 12 mm left lateral port was placed followed by a 5 mm suprapubic port. The appendix was identified, elevated. The mesoappendix was divided with two separate firings of the vascular load EndoGIA and then the base of the appendix was handled with a standard load EndoGIA. The appendix was passed out through the lateral port site with the aid of an EndoCatch bag. The area was checked for hemostasis, found to be adequate. The area was irrigated with copious amounts of normal saline. There was no sign of bleeding or bowel injury, therefore, under direct vision  the left lateral port site was closed with three separate simple sutures of 0 Vicryl utilizing an Endoclose technique. Additional Marcaine was placed for a total of 30 mL. Again, no sign of bleeding or bowel injury. Therefore, pneumoperitoneum was released. All ports were removed. 4-0 subcuticular Monocryl was used at all skin edges. Steri-Strips, Mastisol, and sterile dressings were placed.   The patient tolerated the procedure well. There were no complications. She was taken to the recovery room in stable condition to be admitted for continued care.   ____________________________ Jerrol Banana. Burt Knack, MD rec:drc D: 08/28/2011 22:14:03 ET T: 08/29/2011 09:44:39 ET JOB#: [id number]  cc: Jerrol Banana. Burt Knack, MD, <Dictator> Florene Glen MD ELECTRONICALLY SIGNED 09/01/2011 21:42

## 2014-12-10 ENCOUNTER — Other Ambulatory Visit: Payer: Self-pay | Admitting: Endocrinology

## 2014-12-28 ENCOUNTER — Other Ambulatory Visit: Payer: Self-pay

## 2014-12-28 ENCOUNTER — Encounter: Payer: Self-pay | Admitting: Psychiatry

## 2014-12-28 ENCOUNTER — Ambulatory Visit (INDEPENDENT_AMBULATORY_CARE_PROVIDER_SITE_OTHER): Payer: BC Managed Care – PPO | Admitting: Psychiatry

## 2014-12-28 VITALS — BP 118/80 | HR 100 | Temp 98.7°F | Ht 64.5 in

## 2014-12-28 DIAGNOSIS — F411 Generalized anxiety disorder: Secondary | ICD-10-CM | POA: Diagnosis not present

## 2014-12-28 DIAGNOSIS — F419 Anxiety disorder, unspecified: Secondary | ICD-10-CM | POA: Insufficient documentation

## 2014-12-28 DIAGNOSIS — F331 Major depressive disorder, recurrent, moderate: Secondary | ICD-10-CM | POA: Diagnosis not present

## 2014-12-28 MED ORDER — QUETIAPINE FUMARATE 100 MG PO TABS: 150.0000 mg | ORAL_TABLET | Freq: Every day | ORAL | Status: DC

## 2014-12-28 NOTE — Progress Notes (Signed)
[facility] MD/PA/NP OP Progress Note  12/28/2014 1:42 PM Kaylee Gordon  MRN:  [id number]  Subjective:  Patient returns for follow-up of anxiety and major depression. She states she has been on the vibrated but has had weight gain and significant decrease in her libido. She has been on it 2 weeks. She does state that when she started the vibrated her issues from coming off of the Effexor such as insomnia and being jumpy improved.  Cuss how the options at this point would be to continue with the vibrated as she is only been on it 2 weeks to see if there is an improvement in her mood and interest. However given her side effects it is best to discontinue this medication. I told her to go back to taking 10 mg for 3 days and then stopping this medication. I provided her samples of Brintellix. Instructed to take 5 mg for 7 days and then go to 10 mg. She is to start this after she has stopped her vibrated. Chief Complaint:  Visit Diagnosis:  No diagnosis found.  Past Medical History:  Past Medical History  Diagnosis Date  . Anxiety   . Depression   . Thyroid disease   . Cancer     SKIN CANCER    Past Surgical History  Procedure Laterality Date  . Appendectomy    . Breast enhancement surgery    . Skin cancer excision    . Nasal sinus surgery    . Hemorrhoid surgery     Family History:  Family History  Problem Relation Age of Onset  . Cancer Mother   . Depression Mother   . Anxiety disorder Mother   . Heart disease Father   . Cancer Father   . Anxiety disorder Father   . Depression Father   . Hypoparathyroidism Son    Social History:  History   Social History  . Marital Status: Married    Spouse Name: N/A  . Number of Children: N/A  . Years of Education: N/A   Social History Main Topics  . Smoking status: Never Smoker   . Smokeless tobacco: Never Used  . Alcohol Use: 0.0 oz/week    0 Standard drinks or equivalent per week     Comment: MAYBE ONCE OR TWICE A MONTH  . Drug Use: No   . Sexual Activity: No   Other Topics Concern  . None   Social History Narrative   Additional History:   Assessment:   Musculoskeletal: Strength & Muscle Tone: within normal limits Gait & Station: normal Patient leans: N/A  Psychiatric Specialty Exam: HPI  Review of Systems  Psychiatric/Behavioral: Positive for depression. Negative for suicidal ideas, hallucinations, memory loss and substance abuse. The patient is nervous/anxious. The patient does not have insomnia.     Blood pressure 118/80, pulse 100, temperature 98.7 F (37.1 C), temperature source Tympanic, height 5' 4.5" (1.638 m), SpO2 97 %.There is no weight on file to calculate BMI.  General Appearance: Neat and Well Groomed  Eye Contact:  Good  Speech:  Clear and Coherent and Normal Rate  Volume:  Normal  Mood:  Depressed  Affect:  Constricted  Thought Process:  Linear and Logical  Orientation:  Full (Time, Place, and Person)  Thought Content:  Negative  Suicidal Thoughts:  No  Homicidal Thoughts:  No  Memory:  Immediate;   Good Recent;   Good Remote;   Good  Judgement:  Good  Insight:  Good  Psychomotor Activity:  Negative  and Normal  Concentration:  Good  Recall:  Good  Fund of Knowledge: Good  Language: Good  Akathisia:  Negative  Handed:  Right  AIMS (if indicated):  Not done  Assets:  Communication Skills Desire for Improvement Vocational/Educational  ADL's:  Intact  Cognition: WNL  Sleep:  Okay with seroquel   Is the patient at risk to self?  No. Has the patient been a risk to self in the past 6 months?  No. Has the patient been a risk to self within the distant past?  No. Is the patient a risk to others?  No. Has the patient been a risk to others in the past 6 months?  No. Has the patient been a risk to others within the distant past?  No.  Current Medications: Current Outpatient Prescriptions  Medication Sig Dispense Refill  . celecoxib (CELEBREX) 200 MG capsule Take 200 mg by mouth 2  (two) times daily.    . diazepam (VALIUM) 5 MG tablet Take 1 tablet by mouth 2 (two) times daily.    . fluticasone (FLONASE) 50 MCG/ACT nasal spray     . levothyroxine (SYNTHROID, LEVOTHROID) 75 MCG tablet TAKE 1 TABLET (75 MCG TOTAL) BY MOUTH DAILY. 30 tablet 2  . lisinopril (PRINIVIL,ZESTRIL) 5 MG tablet TAKE 1 TABLET BY MOUTH ONCE A DAY    . lovastatin (MEVACOR) 20 MG tablet Take 20 mg by mouth at bedtime.    . phentermine 15 MG capsule Take 15 mg by mouth every morning. Patient is not sure of dose    . QUEtiapine (SEROQUEL) 100 MG tablet Take 1 tablet by mouth at bedtime.    Marland Kitchen albuterol (PROVENTIL, VENTOLIN) (5 MG/ML) 0.5% NEBU Inhale into the lungs.    Marland Kitchen buPROPion (WELLBUTRIN XL) 150 MG 24 hr tablet Take 1 tablet by mouth every morning.    Marland Kitchen buPROPion (WELLBUTRIN) 100 MG tablet Take 100 mg by mouth 2 (two) times daily.    . calcitRIOL (ROCALTROL) 0.25 MCG capsule TAKE 1 CAPSULE BY MOUTH DAILY. (Patient not taking: Reported on 12/28/2014) 30 capsule 2  . cefdinir (OMNICEF) 300 MG capsule     . diazepam (VALIUM) 5 MG tablet 5 mg.    . HYDROcodone-homatropine (HYCODAN) 5-1.5 MG/5ML syrup     . levofloxacin (LEVAQUIN) 500 MG tablet     . methylPREDNIsolone (MEDROL DOSPACK) 4 MG tablet     . predniSONE (STERAPRED UNI-PAK) 10 MG tablet     . PROVENTIL HFA 108 (90 BASE) MCG/ACT inhaler     . QUEtiapine (SEROQUEL) 25 MG tablet 25 mg.    . QUEtiapine (SEROQUEL) 50 MG tablet Take 1 tablet by mouth daily.    Marland Kitchen venlafaxine XR (EFFEXOR-XR) 37.5 MG 24 hr capsule     . venlafaxine XR (EFFEXOR-XR) 75 MG 24 hr capsule 75 mg.    . venlafaxine XR (EFFEXOR-XR) 75 MG 24 hr capsule Take 1 capsule by mouth 3 (three) times daily.    Marland Kitchen ZOSTAVAX 38250 UNT/0.65ML injection      No current facility-administered medications for this visit.    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  We started viibryd, but she reports side effects of decreased libido, weight gain. Treatment Plan Summary:Medication management  Patient will taper off viibryd, she will take 10 mg for 3 days and then stop. She will then start brintellix 5 mg daily 7 days and then go to  10 mg daily. She'll follow up in 1 month. She's been encouraged, questions or concerns prior to her next appointment. I provided her with samples of 7 -5 mg tablets and 21-10 mg tablets  Faith Rogue 12/28/2014, 1:42 PM

## 2014-12-31 ENCOUNTER — Encounter: Payer: Self-pay | Admitting: Endocrinology

## 2014-12-31 ENCOUNTER — Ambulatory Visit (INDEPENDENT_AMBULATORY_CARE_PROVIDER_SITE_OTHER): Payer: BC Managed Care – PPO | Admitting: Endocrinology

## 2014-12-31 ENCOUNTER — Other Ambulatory Visit (INDEPENDENT_AMBULATORY_CARE_PROVIDER_SITE_OTHER): Payer: BC Managed Care – PPO

## 2014-12-31 VITALS — BP 120/80 | HR 105 | Temp 98.3°F | Resp 16 | Wt 169.0 lb

## 2014-12-31 DIAGNOSIS — E209 Hypoparathyroidism, unspecified: Secondary | ICD-10-CM

## 2014-12-31 DIAGNOSIS — E038 Other specified hypothyroidism: Secondary | ICD-10-CM | POA: Diagnosis not present

## 2014-12-31 LAB — BASIC METABOLIC PANEL
BUN: 22 mg/dL (ref 6–23)
CALCIUM: 8.8 mg/dL (ref 8.4–10.5)
CHLORIDE: 100 meq/L (ref 96–112)
CO2: 24 meq/L (ref 19–32)
Creatinine, Ser: 0.9 mg/dL (ref 0.40–1.20)
GFR: 69.01 mL/min (ref >60.00)
GLUCOSE: 97 mg/dL (ref 70–99)
Potassium: 3.5 mEq/L (ref 3.5–5.1)
SODIUM: 134 meq/L — AB (ref 135–145)

## 2014-12-31 LAB — T4, FREE: FREE T4: 0.8 ng/dL (ref 0.60–1.60)

## 2014-12-31 NOTE — Progress Notes (Signed)
Quick Note:  Please let patient know that the thyroid and calcium level are normal, no change  ______

## 2014-12-31 NOTE — Progress Notes (Signed)
Patient ID: Kaylee Gordon, female   DOB: Oct 16, 1960, 54 y.o.   MRN: [id number]   History of Present Illness:  Chief complaint: Followup of thyroid  ? Hypothyroidism:   Her TSH had been normal with stopping her thyroid supplement.  She was tried on thyroxine supplements with a borderline TSH levels in 2011 However because of complaints of  fatigue on her visit in 10/15 she had thyroid levels done and free T4 was significantly low She was empirically started on 25 g  of levothyroxine Initially she felt less tired with this; however her free T4 continue to be low and her dose has been increased to 75 g since 1/16  She thinks her energy level is currently low since she has had difficulties with her depression She tends to get hot easily  Her weight is stable  Wt Readings from Last 3 Encounters:  12/31/14 169 lb (76.658 kg)  07/22/14 169 lb 3.2 oz (76.749 kg)  05/06/14 165 lb (74.844 kg)    Lab Results  Component Value Date   FREET4 0.59* 07/22/2014   FREET4 0.53* 05/06/2014   TSH 2.64 07/22/2014   TSH 1.70 05/06/2014    Hypoparathyroidism    Has had hypocalcemia since she was 54 years old and presented with symptoms of cramping in her hands along with twitching of her face and eyes. She was [facility] at [facility] as having primary idiopathic hypoparathyroidism.    No recent symptoms of  tingling in her face, muscle cramping or twitching of muscles. Has continued tiredness which is not new.  In 2015 she had been much better with taking her calcium and calcitriol every day and she feels better with the consistent regimen  She is taking calcitriol 0.25 mcg daily and also her calcium 1200 mg. twice a day Her calcium levels have been stable, the last level was checked in the ER in February  Lab Results  Component Value Date   CALCIUM 9.4 08/18/2014   CALCIUM 8.6 07/22/2014   CALCIUM 8.6 02/14/2013   CALCIUM 8.5 09/14/2011   CALCIUM 7.9* 08/29/2011          Medication List       This list is accurate as of: 12/31/14 11:19 AM.  Always use your most recent med list.               BRINTELLIX 10 MG Tabs  Generic drug:  Vortioxetine HBr  Take 10 mg by mouth daily.     calcitRIOL 0.25 MCG capsule  Commonly known as:  ROCALTROL  TAKE 1 CAPSULE BY MOUTH DAILY.     cefdinir 300 MG capsule  Commonly known as:  OMNICEF     celecoxib 200 MG capsule  Commonly known as:  CELEBREX  Take 200 mg by mouth 2 (two) times daily.     diazepam 5 MG tablet  Commonly known as:  VALIUM  5 mg.     diazepam 5 MG tablet  Commonly known as:  VALIUM  Take 1 tablet by mouth 2 (two) times daily.     fluticasone 50 MCG/ACT nasal spray  Commonly known as:  FLONASE     HYDROcodone-homatropine 5-1.5 MG/5ML syrup  Commonly known as:  HYCODAN     levofloxacin 500 MG tablet  Commonly known as:  LEVAQUIN     levothyroxine 75 MCG tablet  Commonly known as:  SYNTHROID, LEVOTHROID  TAKE 1 TABLET (75 MCG TOTAL) BY MOUTH DAILY.     lisinopril 5 MG tablet  Commonly known as:  PRINIVIL,ZESTRIL  TAKE 1 TABLET BY MOUTH ONCE A DAY     lovastatin 20 MG tablet  Commonly known as:  MEVACOR  Take 20 mg by mouth at bedtime.     methylPREDNIsolone 4 MG tablet  Commonly known as:  MEDROL DOSPACK     phentermine 15 MG capsule  Take 15 mg by mouth every morning. Patient is not sure of dose     predniSONE 10 MG tablet  Commonly known as:  STERAPRED UNI-PAK     albuterol (5 MG/ML) 0.5% Nebu  Commonly known as:  PROVENTIL, VENTOLIN  Inhale into the lungs.     PROVENTIL HFA 108 (90 BASE) MCG/ACT inhaler  Generic drug:  albuterol     QUEtiapine 25 MG tablet  Commonly known as:  SEROQUEL  25 mg.     QUEtiapine 50 MG tablet  Commonly known as:  SEROQUEL  Take 1 tablet by mouth daily.     QUEtiapine 100 MG tablet  Commonly known as:  SEROQUEL  Take 1.5 tablets (150 mg total) by mouth at bedtime.     venlafaxine XR 75 MG 24 hr capsule  Commonly known as:   EFFEXOR-XR  75 mg.     venlafaxine XR 37.5 MG 24 hr capsule  Commonly known as:  EFFEXOR-XR     WELLBUTRIN 100 MG tablet  Generic drug:  buPROPion  Take 100 mg by mouth 2 (two) times daily.     buPROPion 150 MG 24 hr tablet  Commonly known as:  WELLBUTRIN XL  Take 1 tablet by mouth every morning.     ZOSTAVAX 43329 UNT/0.65ML injection  Generic drug:  zoster vaccine live (PF)        Allergies:  Allergies  Allergen Reactions  . Biaxin [Clarithromycin]   . Parafon Forte Dsc [Chlorzoxazone]   . Pollen Extract   . Sudafed [Pseudoephedrine Hcl]     Past Medical History  Diagnosis Date  . Anxiety   . Depression   . Thyroid disease   . Cancer     SKIN CANCER    Past Surgical History  Procedure Laterality Date  . Appendectomy    . Breast enhancement surgery    . Skin cancer excision    . Nasal sinus surgery    . Hemorrhoid surgery      Family History  Problem Relation Age of Onset  . Cancer Mother   . Depression Mother   . Anxiety disorder Mother   . Heart disease Father   . Cancer Father   . Anxiety disorder Father   . Depression Father   . Hypoparathyroidism Son     Social History:  reports that she has never smoked. She has never used smokeless tobacco. She reports that she drinks alcohol. She reports that she does not use illicit drugs.  Review of Systems -   She has a history of hypertension and is recently not on  lisinopril Previously was having hypokalemia and HCTZ was stopped  Menopause: she had a normal FSH, since she had an endometrial ablation she does not know if he is having any cycles; no hot flashes Her gynecologist is Dr. Anders Simmonds  Started on Phenteramine by gynecologist for helping her weight loss  History of only mild osteopenia with T score -1.2 at the right hip  History of depression, is on Seroquel low dose and  Wellbutrin  HYPERCHOLESTEROLEMIA:  She thinks her baseline LDL was about 195. However had severe fatigue with  Lipitor  and recent LDL is below 100 with taking low dose lovastatin   EXAM:  BP 120/80 mmHg  Pulse 105  Temp(Src) 98.3 F (36.8 C) (Oral)  Resp 16  Wt 169 lb (76.658 kg)  SpO2 97%   Thyroid not palpable. Biceps reflexes appear normal  Assessment/Plan:   HYPOTHYROIDISM:  She has secondary hypothyroidism with persistently low free T4 and normal TSH.   She did subjectively feel slightly better with taking 25 g of levothyroxine since 10/15 and the dose has been increased up to 75 g Will need follow-up free T4 to see if her levels are adequate Difficult to assess her symptoms that she has fatigue from other issues including depression  To consider HRT if she develops worsening of osteopenia  HYPOPARATHYROIDISM: Her calcium has been well controlled current regimen of 0.25 mcg calcitriol and calcium 1200 mg a day She has been very consistent with her medication and is asymptomatic currently She will have labs checked today again for calcium levels   Kaylee Gordon 12/31/2014, 11:19 AM    Addendum: Free T4 is normal and calcium levels also normal, continue same regimen  Lab on 12/31/2014  Component Date Value Ref Range Status  . Free T4 12/31/2014 0.80  0.60 - 1.60 ng/dL Final  . Sodium 12/31/2014 134* 135 - 145 mEq/L Final  . Potassium 12/31/2014 3.5  3.5 - 5.1 mEq/L Final  . Chloride 12/31/2014 100  96 - 112 mEq/L Final  . CO2 12/31/2014 24  19 - 32 mEq/L Final  . Glucose, Bld 12/31/2014 97  70 - 99 mg/dL Final  . BUN 12/31/2014 22  6 - 23 mg/dL Final  . Creatinine, Ser 12/31/2014 0.90  0.40 - 1.20 mg/dL Final  . Calcium 12/31/2014 8.8  8.4 - 10.5 mg/dL Final  . GFR 12/31/2014 69.01  >60.00 mL/min Final

## 2015-01-06 ENCOUNTER — Other Ambulatory Visit: Payer: Self-pay

## 2015-01-06 DIAGNOSIS — F411 Generalized anxiety disorder: Secondary | ICD-10-CM

## 2015-01-06 MED ORDER — DIAZEPAM 5 MG PO TABS: 5.0000 mg | ORAL_TABLET | Freq: Two times a day (BID) | ORAL | Status: DC

## 2015-01-06 NOTE — Telephone Encounter (Signed)
rx faxed into [facility]. message left for pt that rx faxed

## 2015-01-06 NOTE — Telephone Encounter (Signed)
request for refill on diazepam 5mg  was received today

## 2015-01-06 NOTE — Telephone Encounter (Signed)
Received a request for refill on Valium 5 mg 2 times a day. Patient is due for this medication. We will provide a prescription for 60 with 2 refills.

## 2015-01-07 MED ORDER — DIAZEPAM 5 MG PO TABS: 5.0000 mg | ORAL_TABLET | Freq: Two times a day (BID) | ORAL | Status: DC

## 2015-01-07 NOTE — Telephone Encounter (Signed)
Hunter contacted office prescription for diazepam 5mg  pt take twice a day and rx is only for 30 table instead of 60 tablets. Please sign off on corrected rx.

## 2015-01-07 NOTE — Addendum Note (Signed)
Addended by: Carlynn Purl on: 01/07/2015 12:13 PM   Modules accepted: Orders

## 2015-01-07 NOTE — Addendum Note (Signed)
Addended by: Marjie Skiff on: 01/07/2015 12:41 PM   Modules accepted: Orders

## 2015-01-16 ENCOUNTER — Other Ambulatory Visit: Payer: Self-pay | Admitting: Endocrinology

## 2015-01-27 ENCOUNTER — Ambulatory Visit: Payer: BC Managed Care – PPO | Admitting: Psychiatry

## 2015-01-29 ENCOUNTER — Telehealth: Payer: Self-pay | Admitting: Psychiatry

## 2015-02-04 ENCOUNTER — Ambulatory Visit (INDEPENDENT_AMBULATORY_CARE_PROVIDER_SITE_OTHER): Payer: BC Managed Care – PPO | Admitting: Psychiatry

## 2015-02-04 ENCOUNTER — Encounter: Payer: Self-pay | Admitting: Psychiatry

## 2015-02-04 VITALS — BP 118/82 | HR 64 | Temp 98.2°F | Ht 65.0 in | Wt 144.2 lb

## 2015-02-04 DIAGNOSIS — I5020 Unspecified systolic (congestive) heart failure: Secondary | ICD-10-CM | POA: Insufficient documentation

## 2015-02-04 DIAGNOSIS — F411 Generalized anxiety disorder: Secondary | ICD-10-CM | POA: Diagnosis not present

## 2015-02-04 DIAGNOSIS — E039 Hypothyroidism, unspecified: Secondary | ICD-10-CM | POA: Insufficient documentation

## 2015-02-04 MED ORDER — DIAZEPAM 10 MG PO TABS: ORAL_TABLET | ORAL | Status: DC

## 2015-02-04 MED ORDER — VORTIOXETINE HBR 10 MG PO TABS: 10.0000 mg | ORAL_TABLET | Freq: Every day | ORAL | Status: DC

## 2015-02-04 MED ORDER — QUETIAPINE FUMARATE 200 MG PO TABS: 200.0000 mg | ORAL_TABLET | Freq: Every day | ORAL | Status: DC

## 2015-02-04 NOTE — Progress Notes (Signed)
[place] MD/PA/NP OP Progress Note  02/04/2015 3:17 PM Kaylee Gordon  MRN:  [id number]  Subjective:  Patient returns for follow-up of anxiety and major depression. She has been on the Brintellix and feels it may be helpful. She states she's not having any side effects and not gaining weight and she is pleased with that. However she states the biggest issue for her right now is her "anxiety." She states that she saw her primary care who had gotten a reactive echocardiogram and the results showed she had a decreased ejection fraction. The primary care started her on a beta blocker and also told her that he thought her anxiety played a big role and that issue. We have decided that we would increase her Valium as she has been on a low dose for some time. We will try to work with the medication that she is artery been on.  Y she's been somewhat able to enjoy the summer and that typically she enjoys it by taking walks. However she stated the hot weather has somewhat limited this process as she is unable to walk a lot in the hot weather.  Chief Complaint: anxiety Chief Complaint    Follow-up; Medication Refill; Anxiety; Depression; Panic Attack; Fatigue     Visit Diagnosis:  No diagnosis found.  Past Medical History:  Past Medical History  Diagnosis Date  . Anxiety   . Depression   . Thyroid disease   . Cancer     SKIN CANCER  . CHF (congestive heart failure)     Past Surgical History  Procedure Laterality Date  . Appendectomy    . Breast enhancement surgery    . Skin cancer excision    . Nasal sinus surgery    . Hemorrhoid surgery     Family History:  Family History  Problem Relation Age of Onset  . Cancer Mother   . Depression Mother   . Anxiety disorder Mother   . Heart disease Father   . Cancer Father   . Anxiety disorder Father   . Depression Father   . Hypoparathyroidism Son    Social History:  History   Social History  . Marital Status: Married    Spouse Name: N/A  .  Number of Children: N/A  . Years of Education: N/A   Social History Main Topics  . Smoking status: Never Smoker   . Smokeless tobacco: Never Used  . Alcohol Use: 0.0 oz/week    0 Standard drinks or equivalent per week     Comment: MAYBE ONCE OR TWICE A MONTH  . Drug Use: No  . Sexual Activity: No   Other Topics Concern  . None   Social History Narrative   Additional History:   Assessment:   Musculoskeletal: Strength & Muscle Tone: within normal limits Gait & Station: normal Patient leans: N/A  Psychiatric Specialty Exam: Anxiety Symptoms include nervous/anxious behavior. Patient reports no insomnia or suicidal ideas.      Review of Systems  Psychiatric/Behavioral: Positive for depression. Negative for suicidal ideas, hallucinations, memory loss and substance abuse. The patient is nervous/anxious. The patient does not have insomnia.     Blood pressure 118/82, pulse 64, temperature 98.2 F (36.8 C), temperature source Tympanic, height 5\' 5"  (1.651 m), weight 144 lb 3.2 oz (65.409 kg), SpO2 90 %.Body mass index is 24 kg/(m^2).  General Appearance: Neat and Well Groomed  Eye Contact:  Good  Speech:  Clear and Coherent and Normal Rate  Volume:  Normal  Mood:  good  Affect:  Anxious  Thought Process:  Linear and Logical  Orientation:  Full (Time, Place, and Person)  Thought Content:  Negative  Suicidal Thoughts:  No  Homicidal Thoughts:  No  Memory:  Immediate;   Good Recent;   Good Remote;   Good  Judgement:  Good  Insight:  Good  Psychomotor Activity:  Negative and Normal  Concentration:  Good  Recall:  Good  Fund of Knowledge: Good  Language: Good  Akathisia:  Negative  Handed:  Right  AIMS (if indicated):  Not done  Assets:  Communication Skills Desire for Improvement Vocational/Educational  ADL's:  Intact  Cognition: WNL  Sleep:  Okay with seroquel   Is the patient at risk to self?  No. Has the patient been a risk to self in the past 6 months?   No. Has the patient been a risk to self within the distant past?  No. Is the patient a risk to others?  No. Has the patient been a risk to others in the past 6 months?  No. Has the patient been a risk to others within the distant past?  No.  Current Medications: Current Outpatient Prescriptions  Medication Sig Dispense Refill  . albuterol (PROVENTIL, VENTOLIN) (5 MG/ML) 0.5% NEBU Inhale into the lungs.    Marland Kitchen buPROPion (WELLBUTRIN XL) 150 MG 24 hr tablet     . calcitRIOL (ROCALTROL) 0.25 MCG capsule TAKE 1 CAPSULE BY MOUTH DAILY. 30 capsule 2  . cefdinir (OMNICEF) 300 MG capsule     . celecoxib (CELEBREX) 200 MG capsule Take 200 mg by mouth 2 (two) times daily.    . diazepam (VALIUM) 10 MG tablet Take one tablet in the morning and one half in the evening for seven days then increase to one tablet twice daily 60 tablet 1  . diazepam (VALIUM) 5 MG tablet Take 1 tablet (5 mg total) by mouth 2 (two) times daily. 60 tablet 2  . fluticasone (FLONASE) 50 MCG/ACT nasal spray     . HYDROcodone-homatropine (HYCODAN) 5-1.5 MG/5ML syrup     . levofloxacin (LEVAQUIN) 500 MG tablet     . levothyroxine (SYNTHROID, LEVOTHROID) 75 MCG tablet TAKE 1 TABLET (75 MCG TOTAL) BY MOUTH DAILY. 30 tablet 3  . lisinopril (PRINIVIL,ZESTRIL) 5 MG tablet TAKE 1 TABLET BY MOUTH ONCE A DAY    . lovastatin (MEVACOR) 20 MG tablet Take 20 mg by mouth at bedtime.    . methylPREDNIsolone (MEDROL DOSPACK) 4 MG tablet     . metoprolol tartrate (LOPRESSOR) 25 MG tablet Take by mouth.    . phentermine 15 MG capsule Take 15 mg by mouth every morning. Patient is not sure of dose    . PROVENTIL HFA 108 (90 BASE) MCG/ACT inhaler     . QUEtiapine (SEROQUEL) 100 MG tablet Take 1.5 tablets (150 mg total) by mouth at bedtime. 45 tablet 3  . QUEtiapine (SEROQUEL) 50 MG tablet Take 1 tablet by mouth daily.    Marland Kitchen venlafaxine XR (EFFEXOR-XR) 75 MG 24 hr capsule     . Vortioxetine HBr (BRINTELLIX) 10 MG TABS Take 10 mg by mouth daily.    Marland Kitchen  ZIANA gel APPLY TO AFFECTED AREA ON THE SKIN AT BEDTIME  3  . ZOSTAVAX 30092 UNT/0.65ML injection      No current facility-administered medications for this visit.    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 We started viibryd, but she reports side effects of decreased libido, weight gain. Treatment Plan Summary:Medication management We'll continue her on her Trintellix/Brintellix 10 mg daily. We'll increase her Seroquel to 200 mg at bedtime as she states in the past and this helped her with insomnia. We will increase her diazepam to 10 mg in the morning and 5 mg in the evening for 7 days and then she will go up to 10 mg twice daily. She'll follow up in 1 month. She's been encouraged calling questions or concerns prior to next point. I provided her with samples of the Trintellix 10 mg #21.  Faith Rogue 02/04/2015, 3:17 PM

## 2015-02-10 ENCOUNTER — Ambulatory Visit: Payer: BC Managed Care – PPO | Admitting: Psychiatry

## 2015-02-22 ENCOUNTER — Encounter
Admission: RE | Disposition: A | Discharge status: Home / Self Care | Payer: Self-pay | Source: Ambulatory Visit | Attending: Gastroenterology

## 2015-02-22 ENCOUNTER — Ambulatory Visit
Admission: RE | Admit: 2015-02-22 | Discharge: 2015-02-22 | Disposition: A | Discharge status: Home / Self Care | Payer: BC Managed Care – PPO | Source: Ambulatory Visit | Attending: Gastroenterology | Admitting: Gastroenterology

## 2015-02-22 ENCOUNTER — Ambulatory Visit: Payer: BC Managed Care – PPO | Admitting: Registered Nurse

## 2015-02-22 ENCOUNTER — Encounter: Payer: Self-pay | Admitting: Anesthesiology

## 2015-02-22 DIAGNOSIS — Z85828 Personal history of other malignant neoplasm of skin: Secondary | ICD-10-CM | POA: Insufficient documentation

## 2015-02-22 DIAGNOSIS — Z8489 Family history of other specified conditions: Secondary | ICD-10-CM | POA: Insufficient documentation

## 2015-02-22 DIAGNOSIS — Z809 Family history of malignant neoplasm, unspecified: Secondary | ICD-10-CM | POA: Diagnosis not present

## 2015-02-22 DIAGNOSIS — Z91048 Other nonmedicinal substance allergy status: Secondary | ICD-10-CM | POA: Insufficient documentation

## 2015-02-22 DIAGNOSIS — Z9889 Other specified postprocedural states: Secondary | ICD-10-CM | POA: Diagnosis not present

## 2015-02-22 DIAGNOSIS — K2970 Gastritis, unspecified, without bleeding: Secondary | ICD-10-CM | POA: Insufficient documentation

## 2015-02-22 DIAGNOSIS — Z881 Allergy status to other antibiotic agents status: Secondary | ICD-10-CM | POA: Diagnosis not present

## 2015-02-22 DIAGNOSIS — K210 Gastro-esophageal reflux disease with esophagitis: Secondary | ICD-10-CM | POA: Diagnosis not present

## 2015-02-22 DIAGNOSIS — K644 Residual hemorrhoidal skin tags: Secondary | ICD-10-CM | POA: Diagnosis not present

## 2015-02-22 DIAGNOSIS — Z888 Allergy status to other drugs, medicaments and biological substances status: Secondary | ICD-10-CM | POA: Insufficient documentation

## 2015-02-22 DIAGNOSIS — F329 Major depressive disorder, single episode, unspecified: Secondary | ICD-10-CM | POA: Diagnosis not present

## 2015-02-22 DIAGNOSIS — E079 Disorder of thyroid, unspecified: Secondary | ICD-10-CM | POA: Diagnosis not present

## 2015-02-22 DIAGNOSIS — Z791 Long term (current) use of non-steroidal anti-inflammatories (NSAID): Secondary | ICD-10-CM | POA: Insufficient documentation

## 2015-02-22 DIAGNOSIS — Z79899 Other long term (current) drug therapy: Secondary | ICD-10-CM | POA: Diagnosis not present

## 2015-02-22 DIAGNOSIS — D122 Benign neoplasm of ascending colon: Secondary | ICD-10-CM | POA: Diagnosis not present

## 2015-02-22 DIAGNOSIS — K449 Diaphragmatic hernia without obstruction or gangrene: Secondary | ICD-10-CM | POA: Diagnosis not present

## 2015-02-22 DIAGNOSIS — F419 Anxiety disorder, unspecified: Secondary | ICD-10-CM | POA: Diagnosis not present

## 2015-02-22 DIAGNOSIS — Z9049 Acquired absence of other specified parts of digestive tract: Secondary | ICD-10-CM | POA: Diagnosis not present

## 2015-02-22 DIAGNOSIS — J45909 Unspecified asthma, uncomplicated: Secondary | ICD-10-CM | POA: Insufficient documentation

## 2015-02-22 DIAGNOSIS — Z86010 Personal history of colonic polyps: Secondary | ICD-10-CM | POA: Insufficient documentation

## 2015-02-22 DIAGNOSIS — D123 Benign neoplasm of transverse colon: Secondary | ICD-10-CM | POA: Diagnosis not present

## 2015-02-22 DIAGNOSIS — D125 Benign neoplasm of sigmoid colon: Secondary | ICD-10-CM | POA: Insufficient documentation

## 2015-02-22 DIAGNOSIS — Z7952 Long term (current) use of systemic steroids: Secondary | ICD-10-CM | POA: Diagnosis not present

## 2015-02-22 DIAGNOSIS — I509 Heart failure, unspecified: Secondary | ICD-10-CM | POA: Diagnosis not present

## 2015-02-22 DIAGNOSIS — K640 First degree hemorrhoids: Secondary | ICD-10-CM | POA: Insufficient documentation

## 2015-02-22 DIAGNOSIS — Z7951 Long term (current) use of inhaled steroids: Secondary | ICD-10-CM | POA: Insufficient documentation

## 2015-02-22 DIAGNOSIS — Z8249 Family history of ischemic heart disease and other diseases of the circulatory system: Secondary | ICD-10-CM | POA: Diagnosis not present

## 2015-02-22 DIAGNOSIS — Z818 Family history of other mental and behavioral disorders: Secondary | ICD-10-CM | POA: Diagnosis not present

## 2015-02-22 DIAGNOSIS — Z800 Family history of malignant neoplasm of digestive organs: Secondary | ICD-10-CM | POA: Diagnosis not present

## 2015-02-22 DIAGNOSIS — R1310 Dysphagia, unspecified: Secondary | ICD-10-CM | POA: Insufficient documentation

## 2015-02-22 HISTORY — PX: ESOPHAGOGASTRODUODENOSCOPY (EGD) WITH PROPOFOL: SHX5813

## 2015-02-22 HISTORY — PX: COLONOSCOPY WITH PROPOFOL: SHX5780

## 2015-02-22 HISTORY — PX: SAVORY DILATION: SHX5439

## 2015-02-22 SURGERY — COLONOSCOPY WITH PROPOFOL
Anesthesia: General

## 2015-02-22 MED ORDER — FENTANYL CITRATE (PF) 100 MCG/2ML IJ SOLN
INTRAMUSCULAR | Status: DC | PRN
  Administered 2015-02-22: 50 ug via INTRAVENOUS

## 2015-02-22 MED ORDER — SODIUM CHLORIDE 0.9 % IV SOLN: INTRAVENOUS | Status: DC

## 2015-02-22 MED ORDER — MIDAZOLAM HCL 2 MG/2ML IJ SOLN
INTRAMUSCULAR | Status: DC | PRN
  Administered 2015-02-22: 1 mg via INTRAVENOUS

## 2015-02-22 MED ORDER — SODIUM CHLORIDE 0.9 % IV SOLN
INTRAVENOUS | Status: DC
  Administered 2015-02-22: 1000 mL via INTRAVENOUS

## 2015-02-22 MED ORDER — PROPOFOL INFUSION 10 MG/ML OPTIME
INTRAVENOUS | Status: DC | PRN
  Administered 2015-02-22: 100 ug/kg/min via INTRAVENOUS

## 2015-02-22 NOTE — H&P (Signed)
Primary Care Physician:  Dion Body, MD  Pre-Procedure History & Physical: HPI:  Kaylee Gordon is a 54 y.o. female is here for an colonoscopy / egd   Past Medical History  Diagnosis Date  . Anxiety   . Depression   . Thyroid disease   . Cancer     SKIN CANCER  . CHF (congestive heart failure)     Past Surgical History  Procedure Laterality Date  . Appendectomy    . Breast enhancement surgery    . Skin cancer excision    . Nasal sinus surgery    . Hemorrhoid surgery      Prior to Admission medications   Medication Sig Start Date End Date Taking? Authorizing Provider  albuterol (PROVENTIL, VENTOLIN) (5 MG/ML) 0.5% NEBU Inhale into the lungs.   Yes Historical Provider, MD  calcitRIOL (ROCALTROL) 0.25 MCG capsule TAKE 1 CAPSULE BY MOUTH DAILY. 12/10/14  Yes Elayne Snare, MD  celecoxib (CELEBREX) 200 MG capsule Take 200 mg by mouth 2 (two) times daily.   Yes Historical Provider, MD  diazepam (VALIUM) 10 MG tablet Take one tablet in the morning and one half in the evening for seven days then increase to one tablet twice daily 02/04/15  Yes Marjie Skiff, MD  fluticasone [facility]) 50 MCG/ACT nasal spray  12/25/12  Yes Historical Provider, MD  lisinopril (PRINIVIL,ZESTRIL) 5 MG tablet TAKE 1 TABLET BY MOUTH ONCE A DAY 09/11/14  Yes Historical Provider, MD  lovastatin (MEVACOR) 20 MG tablet Take 20 mg by mouth at bedtime.   Yes Historical Provider, MD  methylPREDNIsolone (MEDROL DOSPACK) 4 MG tablet  09/28/14  Yes Historical Provider, MD  phentermine 15 MG capsule Take 15 mg by mouth every morning. Patient is not sure of dose   Yes Historical Provider, MD  PROVENTIL HFA 108 (90 BASE) MCG/ACT inhaler  10/21/14  Yes Historical Provider, MD  QUEtiapine (SEROQUEL) 50 MG tablet Take 1 tablet by mouth daily. 05/07/14  Yes Historical Provider, MD  Vortioxetine HBr (BRINTELLIX) 10 MG TABS Take 1 tablet (10 mg total) by mouth daily. 02/04/15  Yes Marjie Skiff, MD  buPROPion (WELLBUTRIN  XL) 150 MG 24 hr tablet  11/11/14   Historical Provider, MD  cefdinir (OMNICEF) 300 MG capsule  11/27/14   Historical Provider, MD  diazepam (VALIUM) 5 MG tablet Take 1 tablet (5 mg total) by mouth 2 (two) times daily. 01/07/15   Marjie Skiff, MD  HYDROcodone-homatropine [facility]) 5-1.5 MG/5ML syrup  09/29/14   Historical Provider, MD  levofloxacin (LEVAQUIN) 500 MG tablet  09/28/14   Historical Provider, MD  levothyroxine (SYNTHROID, LEVOTHROID) 75 MCG tablet TAKE 1 TABLET (75 MCG TOTAL) BY MOUTH DAILY. 01/16/15   Elayne Snare, MD  metoprolol tartrate (LOPRESSOR) 25 MG tablet Take by mouth. 02/03/15 02/03/16  Historical Provider, MD  QUEtiapine (SEROQUEL) 200 MG tablet Take 1 tablet (200 mg total) by mouth at bedtime. 02/04/15   Marjie Skiff, MD  venlafaxine XR (EFFEXOR-XR) 75 MG 24 hr capsule  11/11/14   Historical Provider, MD  ZIANA gel APPLY TO AFFECTED AREA ON THE SKIN AT BEDTIME 01/03/15   Historical Provider, MD  ZOSTAVAX 76160 UNT/0.65ML injection  09/15/13   Historical Provider, MD    Allergies as of 02/10/2015 - Review Complete 02/04/2015  Allergen Reaction Noted  . Biaxin [clarithromycin]  02/14/2013  . Parafon forte dsc [chlorzoxazone]  02/14/2013  . Pollen extract  10/27/2014  . Sudafed [pseudoephedrine hcl]  02/14/2013    Family History  Problem Relation Age of Onset  . Cancer Mother   . Depression Mother   . Anxiety disorder Mother   . Heart disease Father   . Cancer Father   . Anxiety disorder Father   . Depression Father   . Hypoparathyroidism Son     History   Social History  . Marital Status: Married    Spouse Name: N/A  . Number of Children: N/A  . Years of Education: N/A   Occupational History  . Not on file.   Social History Main Topics  . Smoking status: Never Smoker   . Smokeless tobacco: Never Used  . Alcohol Use: 0.0 oz/week    0 Standard drinks or equivalent per week     Comment: MAYBE ONCE OR TWICE A MONTH  . Drug Use: No  . Sexual Activity: No    Other Topics Concern  . Not on file   Social History Narrative     Physical Exam: BP 124/74 mmHg  Pulse 98  Temp(Src) 98.6 F (37 C) (Tympanic)  Resp 16  Ht 5\' 4"  (1.626 m)  Wt 73.936 kg (163 lb)  BMI 27.97 kg/m2  SpO2 99% General:   Alert,  pleasant and cooperative in NAD Head:  Normocephalic and atraumatic. Neck:  Supple; no masses or thyromegaly. Lungs:  Clear throughout to auscultation.    Heart:  Regular rate and rhythm. Abdomen:  Soft, nontender and nondistended. Normal bowel sounds, without guarding, and without rebound.   Neurologic:  Alert and  oriented x4;  grossly normal neurologically.  Impression/Plan: Kaylee Gordon is here for an colonoscopy to be performed for surveillance, EGD for dysphagia  Risks, benefits, limitations, and alternatives regarding  Colonoscopy / egd have been reviewed with the patient.  Questions have been answered.  All parties agreeable.   Josefine Class, MD  02/22/2015, 10:02 AM

## 2015-02-22 NOTE — Anesthesia Procedure Notes (Signed)
Performed by: Cilicia Borden Oxygen Delivery Method: Nasal cannula     

## 2015-02-22 NOTE — Anesthesia Postprocedure Evaluation (Signed)
  Anesthesia Post-op Note  Patient: Kaylee Gordon  Procedure(s) Performed: Procedure(s): COLONOSCOPY WITH PROPOFOL (N/A) ESOPHAGOGASTRODUODENOSCOPY (EGD) WITH PROPOFOL (N/A) SAVORY DILATION (N/A)  Anesthesia type:General  Patient location: PACU  Post pain: Pain level controlled  Post assessment: Post-op Vital signs reviewed, Patient's Cardiovascular Status Stable, Respiratory Function Stable, Patent Airway and No signs of Nausea or vomiting  Post vital signs: Reviewed and stable  Last Vitals:  Filed Vitals:   02/22/15 1120  BP: 112/78  Pulse: 95  Temp:   Resp: 18    Level of consciousness: awake, alert  and patient cooperative  Complications: No apparent anesthesia complications

## 2015-02-22 NOTE — Discharge Instructions (Signed)

## 2015-02-22 NOTE — Anesthesia Preprocedure Evaluation (Signed)
Anesthesia Evaluation  Patient identified by MRN, date of birth, ID band Patient awake    Reviewed: Allergy & Precautions, H&P , NPO status , Patient's Chart, lab work & pertinent test results, reviewed documented beta blocker date and time   History of Anesthesia Complications Negative for: history of anesthetic complications  Airway Mallampati: I  TM Distance: >3 FB Neck ROM: full    Dental no notable dental hx.    Pulmonary neg pulmonary ROS,  breath sounds clear to auscultation  Pulmonary exam normal       Cardiovascular Exercise Tolerance: Good hypertension, + angina +CHF - CAD, - Past MI and - CABG Normal cardiovascular examRhythm:regular Rate:Normal     Neuro/Psych PSYCHIATRIC DISORDERS (Severe anxiety) negative neurological ROS     GI/Hepatic negative GI ROS, Neg liver ROS,   Endo/Other  neg diabetesHypothyroidism   Renal/GU negative Renal ROS  negative genitourinary   Musculoskeletal   Abdominal   Peds  Hematology negative hematology ROS (+)   Anesthesia Other Findings Past Medical History:   Anxiety                                                      Depression                                                   Thyroid disease                                              Cancer                                                         Comment:SKIN CANCER   CHF (congestive heart failure)                               Reproductive/Obstetrics negative OB ROS                             Anesthesia Physical Anesthesia Plan  ASA: III  Anesthesia Plan: General   Post-op Pain Management:    Induction:   Airway Management Planned:   Additional Equipment:   Intra-op Plan:   Post-operative Plan:   Informed Consent: I have reviewed the patients History and Physical, chart, labs and discussed the procedure including the risks, benefits and alternatives for the proposed  anesthesia with the patient or authorized representative who has indicated his/her understanding and acceptance.   Dental Advisory Given  Plan Discussed with: Anesthesiologist, CRNA and Surgeon  Anesthesia Plan Comments:         Anesthesia Quick Evaluation

## 2015-02-22 NOTE — Transfer of Care (Signed)
Immediate Anesthesia Transfer of Care Note  Patient: Kaylee Gordon  Procedure(s) Performed: Procedure(s): COLONOSCOPY WITH PROPOFOL (N/A) ESOPHAGOGASTRODUODENOSCOPY (EGD) WITH PROPOFOL (N/A) SAVORY DILATION (N/A)  Patient Location: PACU  Anesthesia Type:General  Level of Consciousness: awake  Airway & Oxygen Therapy: Patient Spontanous Breathing    Post-op Assessment: Report given to RN  Post vital signs: stable  Last Vitals:  Filed Vitals:   02/22/15 1100  BP: 90/46  Pulse: 84  Temp: 36.4 C  Resp: 18    Complications: No apparent anesthesia complications

## 2015-02-22 NOTE — Op Note (Signed)
[facility] Patient Name: Kaylee Gordon Procedure Date: 02/22/2015 9:32 AM MRN: [id number] Account #: [id number] Date of Birth: Feb 08, 1961 Admit Type: Outpatient Age: 54 Room: [facility] ENDO ROOM 4 Gender: Female Note Status: Finalized Procedure:         Upper GI endoscopy Indications:       Dysphagia Patient Profile:   This is a 54 year old female. Providers:         Gerrit Heck. Rayann Heman, MD Referring MD:      Dion Body (Referring MD) Medicines:         Propofol per Anesthesia Complications:     No immediate complications. Procedure:         Pre-Anesthesia Assessment:                    - Prior to the procedure, a History and Physical was                     performed, and patient medications, allergies and                     sensitivities were reviewed. The patient's tolerance of                     previous anesthesia was reviewed.                    After obtaining informed consent, the endoscope was passed                     under direct vision. Throughout the procedure, the                     patient's blood pressure, pulse, and oxygen saturations                     were monitored continuously. The Endoscope was introduced                     through the mouth, and advanced to the second part of                     duodenum. The upper GI endoscopy was accomplished without                     difficulty. The patient tolerated the procedure well. Findings:      A small hiatus hernia was present.      LA Grade B (one or more mucosal breaks greater than 5 mm, not extending       between the tops of two mucosal folds) esophagitis was found at the       gastroesophageal junction. Biopsies were taken with a cold forceps for       histology.      Localized mild inflammation characterized by erythema was found in the       gastric body.      The examined duodenum was normal.      Multiple biopsies were obtained with cold forceps for histology  randomly       in the lower third of the esophagus. Impression:        - Small hiatus hernia.                    - LA Grade B reflux esophagitis. Biopsied.                    -  Gastritis.                    - Normal examined duodenum.                    - Multiple biopsies were obtained in the lower third of                     the esophagus. Recommendation:    - Perform a colonoscopy today.                    - Continue present medications.                    - Await pathology results.                    - Use Prilosec (omeprazole) 40 mg PO daily.                    - Await pathology results.                    - The findings and recommendations were discussed with the                     patient.                    - The findings and recommendations were discussed with the                     patient's family.                    - Return to GI clinic. Procedure Code(s): --- Professional ---                    [phone], Esophagogastroduodenoscopy, flexible, transoral;                     with biopsy, single or multiple CPT copyright 2014 American Medical Association. All rights reserved. The codes documented in this report are preliminary and upon coder review may  be revised to meet current compliance requirements. Mellody Life, MD 02/22/2015 10:29:13 AM This report has been signed electronically. Number of Addenda: 0 Note Initiated On: 02/22/2015 9:32 AM      [facility]

## 2015-02-22 NOTE — Op Note (Signed)
[facility]: [facility] Patient Name: Kaylee Gordon Procedure Date: 02/22/2015 9:30 AM MRN: [id number] Account #: [id number] Date of Birth: [birth year] Admit Type: Outpatient Age: 54 Room: [facility] ENDO ROOM 4 Gender: Female Note Status: Finalized Procedure:         Colonoscopy Indications:       High risk colon cancer surveillance: Personal history of                     non-advanced adenoma, Family history of colon cancer in a                     first-degree relative, Family history of colonic polyps in                     a first-degree relative Patient Profile:   This is a 54 year old female. Providers:         Gerrit Heck. Rayann Heman, MD Referring MD:      Dion Body (Referring MD) Medicines:         Propofol per Anesthesia Complications:     No immediate complications. Procedure:         Pre-Anesthesia Assessment:                    - Prior to the procedure, a History and Physical was                     performed, and patient medications, allergies and                     sensitivities were reviewed. The patient's tolerance of                     previous anesthesia was reviewed.                    After obtaining informed consent, the colonoscope was                     passed under direct vision. Throughout the procedure, the                     patient's blood pressure, pulse, and oxygen saturations                     were monitored continuously. The Olympus CF-Q160AL                     colonoscope (S#. [phone]) was introduced through the anus                     and advanced to the the cecum, identified by appendiceal                     orifice and ileocecal valve. The colonoscopy was performed                     without difficulty. The patient tolerated the procedure                     well. The quality of the bowel preparation was excellent. Findings:      Two sessile polyps were found in the ascending colon. The polyps were 3       to 4 mm in  size. These polyps were removed with a  cold snare. Resection       and retrieval were complete.      A 2 mm polyp was found in the ascending colon. The polyp was removed       with a jumbo cold forceps. Resection and retrieval were complete.      A 6 mm polyp was found in the proximal transverse colon. The polyp was       sessile. The polyp was removed with a hot snare. Resection and retrieval       were complete.      A 5 mm polyp was found in the sigmoid colon. The polyp was sessile. The       polyp was removed with a hot snare. Resection and retrieval were       complete.      Internal hemorrhoids were found during retroflexion. The hemorrhoids       were Grade I (internal hemorrhoids that do not prolapse).      The perianal exam findings include non-thrombosed external hemorrhoids. Impression:        - Two 3 to 4 mm polyps in the ascending colon. Resected                     and retrieved.                    - One 2 mm polyp in the ascending colon. Resected and                     retrieved.                    - One 6 mm polyp in the proximal transverse colon.                     Resected and retrieved.                    - One 5 mm polyp in the sigmoid colon. Resected and                     retrieved.                    - Internal hemorrhoids.                    - Non-thrombosed external hemorrhoids found on perianal                     exam. Recommendation:    - Observe patient in GI recovery unit.                    - High fiber diet.                    - Continue present medications.                    - Await pathology results.                    - Repeat colonoscopy for surveillance based on pathology                     result, likely in 3 years                    - The findings and recommendations were  discussed with the                     patient.                    - The findings and recommendations were discussed with the                     patient's  family. Procedure Code(s): --- Professional ---                    [phone], Colonoscopy, flexible; with removal of tumor(s),                     polyp(s), or other lesion(s) by snare technique                    26415, 68, Colonoscopy, flexible; with biopsy, single or                     multiple CPT copyright 2014 American Medical Association. All rights reserved. The codes documented in this report are preliminary and upon coder review may  be revised to meet current compliance requirements. Mellody Life, MD 02/22/2015 10:59:04 AM This report has been signed electronically. Number of Addenda: 0 Note Initiated On: 02/22/2015 9:30 AM Scope Withdrawal Time: 0 hours 17 minutes 15 seconds  Total Procedure Duration: 0 hours 22 minutes 25 seconds       [facility]

## 2015-02-23 ENCOUNTER — Encounter: Payer: Self-pay | Admitting: Gastroenterology

## 2015-02-23 LAB — SURGICAL PATHOLOGY

## 2015-03-01 ENCOUNTER — Ambulatory Visit: Payer: Self-pay | Admitting: Psychiatry

## 2015-03-05 NOTE — Addendum Note (Signed)
Addendum  created 03/05/15 2041 by Martha Clan, MD   Modules edited: Anesthesia Attestations

## 2015-03-13 ENCOUNTER — Other Ambulatory Visit: Payer: Self-pay | Admitting: Psychiatry

## 2015-03-16 ENCOUNTER — Ambulatory Visit: Payer: Self-pay | Admitting: Psychiatry

## 2015-03-17 ENCOUNTER — Other Ambulatory Visit: Payer: Self-pay | Admitting: Endocrinology

## 2015-03-18 NOTE — Telephone Encounter (Signed)
rx faxed and confirmed received.

## 2015-03-24 ENCOUNTER — Encounter: Payer: Self-pay | Admitting: Psychiatry

## 2015-03-24 ENCOUNTER — Ambulatory Visit (INDEPENDENT_AMBULATORY_CARE_PROVIDER_SITE_OTHER): Payer: BC Managed Care – PPO | Admitting: Psychiatry

## 2015-03-24 VITALS — BP 110/78 | HR 100 | Temp 98.3°F | Ht 64.0 in | Wt 168.8 lb

## 2015-03-24 DIAGNOSIS — I5020 Unspecified systolic (congestive) heart failure: Secondary | ICD-10-CM | POA: Insufficient documentation

## 2015-03-24 DIAGNOSIS — F331 Major depressive disorder, recurrent, moderate: Secondary | ICD-10-CM

## 2015-03-24 DIAGNOSIS — F411 Generalized anxiety disorder: Secondary | ICD-10-CM | POA: Diagnosis not present

## 2015-03-24 NOTE — Progress Notes (Signed)
[facility] MD/PA/NP OP Progress Note  03/24/2015 4:09 PM Kaylee Gordon  MRN:  [id number]  Subjective:  Patient returns for follow-up of anxiety and major depression. He feels like the Trintellix has stabilized her depression. She states her anxiety has improved with the increase in the Valium. 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 She rates her appetite is good. States he sleeping well. States the only impairment to sleep is that her husband likes to fall asleep with the TV and this can affect her ability to fall asleep.  In regards to anxiety she states that it has been better. She states that it is triggered by some real life events. States she was worried about her son when he was spending the summer in [place]. She states she worried about things like him being on the subway. Currently her son is a Equities trader in college and her daughter is a Equities trader in high school. She states she has an upcoming anniversary and her husband's birthday in October. States this is always a stressful month because he goes "crazy." When asked what she meant she stated he starts yelling about various things (i.e. money) and this is stressful. Chief Complaint: anxiety  Visit Diagnosis:  No diagnosis found.  Past Medical History:  Past Medical History  Diagnosis Date  . Anxiety   . Depression   . Thyroid disease   . Cancer     SKIN CANCER  . CHF (congestive heart failure)     Past Surgical History  Procedure Laterality Date  . Appendectomy    . Breast enhancement surgery    . Skin cancer excision    . Nasal sinus surgery    . Hemorrhoid surgery    . Colonoscopy with propofol N/A 02/22/2015    Procedure: COLONOSCOPY WITH PROPOFOL;  Surgeon: Josefine Class, MD;  Location: [facility] ENDOSCOPY;  Service: Endoscopy;  Laterality: N/A;  . Esophagogastroduodenoscopy (egd) with propofol N/A 02/22/2015    Procedure: ESOPHAGOGASTRODUODENOSCOPY (EGD) WITH PROPOFOL;  Surgeon:  Josefine Class, MD;  Location: [facility] ENDOSCOPY;  Service: Endoscopy;  Laterality: N/A;  . Savory dilation N/A 02/22/2015    Procedure: SAVORY DILATION;  Surgeon: Josefine Class, MD;  Location: [facility] ENDOSCOPY;  Service: Endoscopy;  Laterality: N/A;   Family History:  Family History  Problem Relation Age of Onset  . Cancer Mother   . Depression Mother   . Anxiety disorder Mother   . Heart disease Father   . Cancer Father   . Anxiety disorder Father   . Depression Father   . Hypoparathyroidism Son    Social History:  Social History   Social History  . Marital Status: Married    Spouse Name: N/A  . Number of Children: N/A  . Years of Education: N/A   Social History Main Topics  . Smoking status: Never Smoker   . Smokeless tobacco: Never Used  . Alcohol Use: 0.0 oz/week    0 Standard drinks or equivalent per week     Comment: MAYBE ONCE OR TWICE A MONTH  . Drug Use: No  . Sexual Activity: No   Other Topics Concern  . Not on file   Social History Narrative   Additional History:   Assessment:   Musculoskeletal: Strength & Muscle Tone: within normal limits Gait & Station: normal Patient leans: N/A  Psychiatric Specialty Exam: Anxiety Symptoms include nervous/anxious behavior. Patient reports no insomnia or suicidal ideas.  Review of Systems  Psychiatric/Behavioral: Negative for depression, suicidal ideas, hallucinations, memory loss and substance abuse. The patient is nervous/anxious. The patient does not have insomnia.     There were no vitals taken for this visit.There is no weight on file to calculate BMI.  General Appearance: Neat and Well Groomed  Eye Contact:  Good  Speech:  Clear and Coherent and Normal Rate  Volume:  Normal  Mood:  good  Affect:  Anxious  Thought Process:  Linear and Logical  Orientation:  Full (Time, Place, and Person)  Thought Content:  Negative  Suicidal Thoughts:  No  Homicidal Thoughts:  No  Memory:  Immediate;    Good Recent;   Good Remote;   Good  Judgement:  Good  Insight:  Good  Psychomotor Activity:  Negative and Normal  Concentration:  Good  Recall:  Good  Fund of Knowledge: Good  Language: Good  Akathisia:  Negative  Handed:  Right  AIMS (if indicated):  Not done  Assets:  Communication Skills Desire for Improvement Vocational/Educational  ADL's:  Intact  Cognition: WNL  Sleep:  Okay with seroquel   Is the patient at risk to self?  No. Has the patient been a risk to self in the past 6 months?  No. Has the patient been a risk to self within the distant past?  No. Is the patient a risk to others?  No. Has the patient been a risk to others in the past 6 months?  No. Has the patient been a risk to others within the distant past?  No.  Current Medications: Current Outpatient Prescriptions  Medication Sig Dispense Refill  . albuterol (PROVENTIL, VENTOLIN) (5 MG/ML) 0.5% NEBU Inhale into the lungs.    Marland Kitchen buPROPion (WELLBUTRIN XL) 150 MG 24 hr tablet     . calcitRIOL (ROCALTROL) 0.25 MCG capsule TAKE 1 CAPSULE BY MOUTH DAILY. 30 capsule 1  . cefdinir (OMNICEF) 300 MG capsule     . celecoxib (CELEBREX) 200 MG capsule Take 200 mg by mouth 2 (two) times daily.    . diazepam (VALIUM) 10 MG tablet Take 1 tablet (10 mg total) by mouth 2 (two) times daily. 60 tablet 0  . diazepam (VALIUM) 5 MG tablet Take 1 tablet (5 mg total) by mouth 2 (two) times daily. 60 tablet 2  . fluticasone (FLONASE) 50 MCG/ACT nasal spray     . HYDROcodone-homatropine (HYCODAN) 5-1.5 MG/5ML syrup     . levofloxacin (LEVAQUIN) 500 MG tablet     . levothyroxine (SYNTHROID, LEVOTHROID) 75 MCG tablet TAKE 1 TABLET (75 MCG TOTAL) BY MOUTH DAILY. 30 tablet 3  . lisinopril (PRINIVIL,ZESTRIL) 5 MG tablet TAKE 1 TABLET BY MOUTH ONCE A DAY    . lovastatin (MEVACOR) 20 MG tablet Take 20 mg by mouth at bedtime.    . methylPREDNIsolone (MEDROL DOSPACK) 4 MG tablet     . metoprolol tartrate (LOPRESSOR) 25 MG tablet Take by  mouth.    . phentermine 15 MG capsule Take 15 mg by mouth every morning. Patient is not sure of dose    . PROVENTIL HFA 108 (90 BASE) MCG/ACT inhaler     . QUEtiapine (SEROQUEL) 200 MG tablet Take 1 tablet (200 mg total) by mouth at bedtime. 30 tablet 2  . QUEtiapine (SEROQUEL) 50 MG tablet Take 1 tablet by mouth daily.    Marland Kitchen venlafaxine XR (EFFEXOR-XR) 75 MG 24 hr capsule     . Vortioxetine HBr (BRINTELLIX) 10 MG TABS Take 1 tablet (10  mg total) by mouth daily. 30 tablet 2  . ZIANA gel APPLY TO AFFECTED AREA ON THE SKIN AT BEDTIME  3  . ZOSTAVAX 65537 UNT/0.65ML injection      No current facility-administered medications for this visit.    Medical Decision Making:  Established Problem, Stable/Improving (1)  Treatment Plan Summary:Medication management Her depression and anxiety are stable on the current regimen. I did discuss perhaps getting involved in therapy and doing CBT. Patient states she will think about this but does not want an additional commitment at this time. We'll continue her on her Trintellix 10 mg daily. We'll increase her Seroquel to 200 mg at bedtime as she states in the past and this helped her with insomnia. Continue diazepam  10 mg twice daily. She'll follow up in 2 months. She's been encouraged calling questions or concerns prior to next point. I provided her with samples of the Trintellix 10 mg #28.  She has enough of all of her medications at this time. She will need more Valium at the end of this month. She will also need her other medications in late October. She will call to request these refills. Faith Rogue 03/24/2015, 4:09 PM

## 2015-04-10 ENCOUNTER — Other Ambulatory Visit: Payer: Self-pay | Admitting: Psychiatry

## 2015-04-12 NOTE — Telephone Encounter (Signed)
faxed rx - received confirmation

## 2015-04-18 ENCOUNTER — Other Ambulatory Visit: Payer: Self-pay | Admitting: Psychiatry

## 2015-04-19 ENCOUNTER — Telehealth: Payer: Self-pay | Admitting: Psychiatry

## 2015-04-19 NOTE — Telephone Encounter (Signed)
Spoke with patient today. 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 She feels this may be related to the trintellix. I indicated that the way to assess this would be for her to stop it. She indicated she got written up today for mistakes. She states she can't concentrate. I have encouraged her to make an earlier appointment with me and to discontinue the trintellix. At the next visit we may assess her for a trial of Pristiq or other anxiety medication. We have made an appointment for April 22, 2015. AW

## 2015-04-22 ENCOUNTER — Encounter: Payer: Self-pay | Admitting: Psychiatry

## 2015-04-22 ENCOUNTER — Ambulatory Visit (INDEPENDENT_AMBULATORY_CARE_PROVIDER_SITE_OTHER): Payer: BC Managed Care – PPO | Admitting: Psychiatry

## 2015-04-22 VITALS — BP 122/78 | HR 108 | Temp 98.4°F | Ht 64.0 in | Wt 164.8 lb

## 2015-04-22 DIAGNOSIS — F411 Generalized anxiety disorder: Secondary | ICD-10-CM | POA: Diagnosis not present

## 2015-04-22 DIAGNOSIS — F331 Major depressive disorder, recurrent, moderate: Secondary | ICD-10-CM | POA: Diagnosis not present

## 2015-04-22 DIAGNOSIS — E7800 Pure hypercholesterolemia, unspecified: Secondary | ICD-10-CM | POA: Insufficient documentation

## 2015-04-22 MED ORDER — DESVENLAFAXINE SUCCINATE ER 50 MG PO TB24: 50.0000 mg | ORAL_TABLET | ORAL | Status: DC

## 2015-04-22 NOTE — Progress Notes (Signed)
[place] MD/PA/NP OP Progress Note  04/22/2015 4:27 PM Kaylee Gordon  MRN:  [id number]  Subjective:  Patient returns for follow-up of anxiety and major depression. She called earlier this week stating that she was becoming overwhelmed at work. She states she got written up for an issue related to some documentation. 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 She states that she constantly thinks about work, she feels like she is going to be criticized him like her supervisors are waiting for her to do something wrong. She states that it has affected her ability to enjoy things outside of work. She states she constantly feels stressed and is anxious and worried. 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  When she called earlier this week she indicated that the trintellix she believes is causing her to have muscle cramps in her legs. I instructed her to stop the medication. She states that issue went away since she has stopped the medication. I did discuss that medication wise week perhaps could give her a small afternoon dose of Valium. However patient states she does not want to take any more of it. I discussed that perhaps she could go back on Effexor as she was on it for years and we discontinued because she felt it was not helping her however it is possible it was helping her mood to some extent. She indicates she did not want to go back on that because of the side effects of coming off of it and she also states she had side effects going on that medication. Thus I discussed that of the medication she has not tried Pristiq is one of the remaining options. She is interested in this to assist her with some depression which she relates his related to her work environment. She did ask about the options of short-term disability. I told  her that that would be something she would have to decide and apply for. 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 Chief Complaint: anxiety Chief Complaint    Follow-up; Medication Refill; Stress; Fatigue; Anxiety; Panic Attack; Depression     Visit Diagnosis:  No diagnosis found.  Past Medical History:  Past Medical History  Diagnosis Date  . Anxiety   . Depression   . Thyroid disease   . Cancer [facility])     SKIN CANCER  . CHF (congestive heart failure) [facility])     Past Surgical History  Procedure Laterality Date  . Appendectomy    . Breast enhancement surgery    . Skin cancer excision    . Nasal sinus surgery    . Hemorrhoid surgery    . Colonoscopy with propofol N/A 02/22/2015    Procedure: COLONOSCOPY WITH PROPOFOL;  Surgeon: Josefine Class, MD;  Location: [facility] ENDOSCOPY;  Service: Endoscopy;  Laterality: N/A;  . Esophagogastroduodenoscopy (egd) with propofol N/A 02/22/2015    Procedure: ESOPHAGOGASTRODUODENOSCOPY (EGD) WITH PROPOFOL;  Surgeon: Josefine Class, MD;  Location: [facility] ENDOSCOPY;  Service: Endoscopy;  Laterality: N/A;  . Savory dilation N/A 02/22/2015    Procedure: SAVORY DILATION;  Surgeon: Josefine Class, MD;  Location: [facility] ENDOSCOPY;  Service: Endoscopy;  Laterality: N/A;   Family History:  Family History  Problem Relation Age of Onset  . Cancer Mother   . Depression Mother   . Anxiety disorder Mother   . Heart disease Father   . Cancer Father   . Anxiety disorder Father   . Depression Father   . Hypoparathyroidism Son    Social History:  Social History   Social History  . Marital Status: Married    Spouse Name: N/A  . Number of Children: N/A  . Years of Education: N/A   Social History Main Topics  . Smoking status: Never Smoker   . Smokeless tobacco: Never Used  .  Alcohol Use: 0.0 oz/week    0 Standard drinks or equivalent per week     Comment: MAYBE ONCE OR TWICE A MONTH  . Drug Use: No  . Sexual Activity: No   Other Topics Concern  . None   Social History Narrative   Additional History:   Assessment:   Musculoskeletal: Strength & Muscle Tone: within normal limits Gait & Station: normal Patient leans: N/A  Psychiatric Specialty Exam: Anxiety Symptoms include nervous/anxious behavior. Patient reports no insomnia or suicidal ideas.    Depression        Associated symptoms include does not have insomnia and no suicidal ideas.  Past medical history includes anxiety.     Review of Systems  Psychiatric/Behavioral: Positive for depression. Negative for suicidal ideas, hallucinations, memory loss and substance abuse. The patient is nervous/anxious. The patient does not have insomnia.     Blood pressure 122/78, pulse 108, temperature 98.4 F (36.9 C), temperature source Tympanic, height 5\' 4"  (1.626 m), weight 164 lb 12.8 oz (74.753 kg), SpO2 98 %.Body mass index is 28.27 kg/(m^2).  General Appearance: Neat and Well Groomed  Eye Contact:  Good  Speech:  Clear and Coherent and Normal Rate  Volume:  Normal  Mood:  good  Affect:  Anxious and tearful  Thought Process:  Linear and Logical  Orientation:  Full (Time, Place, and Person)  Thought Content:  Negative  Suicidal Thoughts:  No  Homicidal Thoughts:  No  Memory:  Immediate;   Good Recent;   Good Remote;   Good  Judgement:  Good  Insight:  Good  Psychomotor Activity:  Negative and Normal  Concentration:  Good  Recall:  Good  Fund of Knowledge: Good  Language: Good  Akathisia:  Negative  Handed:  Right  AIMS (if indicated):  Not done  Assets:  Communication Skills Desire for Improvement Vocational/Educational  ADL's:  Intact  Cognition: WNL  Sleep:  Okay with seroquel   Is the patient at risk to self?  No. Has the patient been a risk to self in the past 6 months?   No. Has the patient been a risk to self within the distant past?  No. Is the patient a risk to others?  No. Has the patient been a risk to others in the past 6 months?  No. Has the patient been a risk to others within the distant past?  No.  Current Medications: Current Outpatient Prescriptions  Medication Sig Dispense Refill  . albuterol (PROVENTIL, VENTOLIN) (5 MG/ML) 0.5% NEBU Inhale into the lungs.    . calcitRIOL (ROCALTROL) 0.25 MCG capsule TAKE 1 CAPSULE BY MOUTH DAILY. 30 capsule 1  . celecoxib (CELEBREX) 200 MG capsule Take 200 mg by mouth 2 (two) times daily.    . diazepam (VALIUM) 10 MG tablet TAKE 1 TABLET TWICE DAILY 60 tablet  0  . fluticasone (FLONASE) 50 MCG/ACT nasal spray     . levothyroxine (SYNTHROID, LEVOTHROID) 75 MCG tablet TAKE 1 TABLET (75 MCG TOTAL) BY MOUTH DAILY. 30 tablet 3  . lisinopril (PRINIVIL,ZESTRIL) 5 MG tablet TAKE 1 TABLET BY MOUTH ONCE A DAY    . lovastatin (MEVACOR) 20 MG tablet Take 20 mg by mouth at bedtime.    . metoprolol tartrate (LOPRESSOR) 25 MG tablet Take by mouth.    Marland Kitchen omeprazole (PRILOSEC) 40 MG capsule     . PROVENTIL HFA 108 (90 BASE) MCG/ACT inhaler     . QUEtiapine (SEROQUEL) 100 MG tablet     . QUEtiapine (SEROQUEL) 200 MG tablet TAKE 1 TABLET (200 MG TOTAL) BY MOUTH AT BEDTIME. 30 tablet 1  . ZIANA gel APPLY TO AFFECTED AREA ON THE SKIN AT BEDTIME  3  . ZOSTAVAX 41638 UNT/0.65ML injection     . desvenlafaxine (PRISTIQ) 50 MG 24 hr tablet Take 1 tablet (50 mg total) by mouth every morning. 30 tablet 1   No current facility-administered medications for this visit.    Medical Decision Making:  Established Problem, Stable/Improving (1)  Treatment Plan Summary:Medication management Major depressive disorder-he patient has discontinued the chart until excess noted above due to leg cramps. We will start some Pristiq 50 mg in the morning. Risk and benefits of been discussed patient's able to consent. She will continue her Seroquel 200 mg  at bedtime.  Generalized anxieties order-this appears to be worsened but also the timing seems consistent with some stressors in the work environment. She'll continue her Valium 10 mg twice a day. Hopefully the Pristiq will have some benefit for anxiety as well.  She'll follow-up in 2 weeks. She's been encouraged calling questions concerns prior to her next appointment. Faith Rogue 04/22/2015, 4:27 PM

## 2015-05-06 ENCOUNTER — Ambulatory Visit (INDEPENDENT_AMBULATORY_CARE_PROVIDER_SITE_OTHER): Payer: BC Managed Care – PPO | Admitting: Psychiatry

## 2015-05-06 ENCOUNTER — Encounter: Payer: Self-pay | Admitting: Psychiatry

## 2015-05-06 VITALS — BP 120/78 | HR 104 | Temp 98.8°F | Ht 64.0 in | Wt 165.0 lb

## 2015-05-06 DIAGNOSIS — F331 Major depressive disorder, recurrent, moderate: Secondary | ICD-10-CM

## 2015-05-06 DIAGNOSIS — F411 Generalized anxiety disorder: Secondary | ICD-10-CM

## 2015-05-06 MED ORDER — DIAZEPAM 10 MG PO TABS: 10.0000 mg | ORAL_TABLET | Freq: Two times a day (BID) | ORAL | Status: DC

## 2015-05-06 NOTE — Progress Notes (Signed)
[place] MD/PA/NP OP Progress Note  05/06/2015 4:32 PM Kaylee Gordon  MRN:  [id number]  Subjective:  Patient returns for follow-up of anxiety and major depression. Patient returns indicating she continues to have significant anxiety and worry on the job. She states she's not able to concentrate and focus on the job because she is always worried about will she get something done what as she forgot to do and constant thoughts like supervisors are trying to get rid of her. She states this is affecting her personal life is when she gets home she continues to worry about things at work. She indicated there was a weekend in which she did not go to work on Friday and then that weekend was better in terms of her being able to engage in things and have a better mood. However she reported that on Sunday night all the anxiety tend to intensify with the anticipation of returning to work. She indicates when she gets home she has spent a lot of time in bed because she is exhausted. Chief Complaint: anxiety Chief Complaint    Follow-up; Medication Refill; Anxiety; Depression; Panic Attack; Fatigue     Visit Diagnosis:  No diagnosis found.  Past Medical History:  Past Medical History  Diagnosis Date  . Anxiety   . Depression   . Thyroid disease   . Cancer [facility])     SKIN CANCER  . CHF (congestive heart failure) [facility])     Past Surgical History  Procedure Laterality Date  . Appendectomy    . Breast enhancement surgery    . Skin cancer excision    . Nasal sinus surgery    . Hemorrhoid surgery    . Colonoscopy with propofol N/A 02/22/2015    Procedure: COLONOSCOPY WITH PROPOFOL;  Surgeon: Josefine Class, MD;  Location: [facility] ENDOSCOPY;  Service: Endoscopy;  Laterality: N/A;  . Esophagogastroduodenoscopy (egd) with propofol N/A 02/22/2015    Procedure: ESOPHAGOGASTRODUODENOSCOPY (EGD) WITH PROPOFOL;  Surgeon: Josefine Class, MD;  Location: [facility] ENDOSCOPY;  Service: Endoscopy;  Laterality: N/A;  .  Savory dilation N/A 02/22/2015    Procedure: SAVORY DILATION;  Surgeon: Josefine Class, MD;  Location: [facility] ENDOSCOPY;  Service: Endoscopy;  Laterality: N/A;   Family History:  Family History  Problem Relation Age of Onset  . Cancer Mother   . Depression Mother   . Anxiety disorder Mother   . Heart disease Father   . Cancer Father   . Anxiety disorder Father   . Depression Father   . Hypoparathyroidism Son    Social History:  Social History   Social History  . Marital Status: Married    Spouse Name: N/A  . Number of Children: N/A  . Years of Education: N/A   Social History Main Topics  . Smoking status: Never Smoker   . Smokeless tobacco: Never Used  . Alcohol Use: 0.0 oz/week    0 Standard drinks or equivalent per week     Comment: MAYBE ONCE OR TWICE A MONTH  . Drug Use: No  . Sexual Activity: No   Other Topics Concern  . None   Social History Narrative   Additional History:   Assessment:   Musculoskeletal: Strength & Muscle Tone: within normal limits Gait & Station: normal Patient leans: N/A  Psychiatric Specialty Exam: Anxiety Symptoms include nervous/anxious behavior. Patient reports no insomnia (hypersomnia) or suicidal ideas.    Depression        Associated symptoms include does not have insomnia (hypersomnia)  and no suicidal ideas.  Past medical history includes anxiety.     Review of Systems  Psychiatric/Behavioral: Positive for depression. Negative for suicidal ideas, hallucinations, memory loss and substance abuse. The patient is nervous/anxious. The patient does not have insomnia (hypersomnia).     Blood pressure 120/78, pulse 104, temperature 98.8 F (37.1 C), temperature source Tympanic, height 5\' 4"  (1.626 m), weight 165 lb (74.844 kg), SpO2 96 %.Body mass index is 28.31 kg/(m^2).  General Appearance: Neat and Well Groomed  Eye Contact:  Good  Speech:  Clear and Coherent and Normal Rate  Volume:  Normal  Mood:  good  Affect:  Anxious  and tearful  Thought Process:  Linear and Logical  Orientation:  Full (Time, Place, and Person)  Thought Content:  Negative  Suicidal Thoughts:  No  Homicidal Thoughts:  No  Memory:  Immediate;   Good Recent;   Good Remote;   Good  Judgement:  Good  Insight:  Good  Psychomotor Activity:  Negative and Normal  Concentration:  Good  Recall:  Good  Fund of Knowledge: Good  Language: Good  Akathisia:  Negative  Handed:  Right  AIMS (if indicated):  Not done  Assets:  Communication Skills Desire for Improvement Vocational/Educational  ADL's:  Intact  Cognition: WNL  Sleep:  Sleeping more on weekends    Is the patient at risk to self?  No. Has the patient been a risk to self in the past 6 months?  No. Has the patient been a risk to self within the distant past?  No. Is the patient a risk to others?  No. Has the patient been a risk to others in the past 6 months?  No. Has the patient been a risk to others within the distant past?  No.  Current Medications: Current Outpatient Prescriptions  Medication Sig Dispense Refill  . albuterol (PROVENTIL, VENTOLIN) (5 MG/ML) 0.5% NEBU Inhale into the lungs.    . calcitRIOL (ROCALTROL) 0.25 MCG capsule TAKE 1 CAPSULE BY MOUTH DAILY. 30 capsule 1  . celecoxib (CELEBREX) 200 MG capsule Take 200 mg by mouth 2 (two) times daily.    Marland Kitchen desvenlafaxine (PRISTIQ) 50 MG 24 hr tablet Take 1 tablet (50 mg total) by mouth every morning. 30 tablet 1  . diazepam (VALIUM) 10 MG tablet Take 1 tablet (10 mg total) by mouth 2 (two) times daily. 60 tablet 1  . fluticasone (FLONASE) 50 MCG/ACT nasal spray     . levothyroxine (SYNTHROID, LEVOTHROID) 75 MCG tablet TAKE 1 TABLET (75 MCG TOTAL) BY MOUTH DAILY. 30 tablet 3  . lisinopril (PRINIVIL,ZESTRIL) 5 MG tablet TAKE 1 TABLET BY MOUTH ONCE A DAY    . lovastatin (MEVACOR) 20 MG tablet Take 20 mg by mouth at bedtime.    . metoprolol tartrate (LOPRESSOR) 25 MG tablet Take by mouth.    Marland Kitchen omeprazole (PRILOSEC) 40 MG  capsule     . PROVENTIL HFA 108 (90 BASE) MCG/ACT inhaler     . QUEtiapine (SEROQUEL) 200 MG tablet TAKE 1 TABLET (200 MG TOTAL) BY MOUTH AT BEDTIME. 30 tablet 1  . ZIANA gel APPLY TO AFFECTED AREA ON THE SKIN AT BEDTIME  3  . ZOSTAVAX 44034 UNT/0.65ML injection      No current facility-administered medications for this visit.    Medical Decision Making:  Established Problem, Stable/Improving (1)  Treatment Plan Summary:Medication management Major depressive disorder- Continue Pristiq 50 mg in the morning. Risk and benefits of been discussed patient's able to consent.  She will continue her Seroquel 200 mg at bedtime. She will have only been on the Pristiq now since 04/22/2015 and thus we will give this a little bit more time to work.  Generalized anxieties order-this appears to be worsened but also the timing seems consistent with some stressors in the work environment. She'll continue her Valium 10 mg twice a day. Hopefully the Pristiq will have some benefit for anxiety as well. However given that her anxiety continues to be somewhat debilitating and is impacting her ability to function have written her a note to be out of work until her next appointment on 05/24/2015 at which time we will reassess her.  She'll follow-up in 2 weeks. She's been encouraged calling questions concerns prior to her next appointment. Faith Rogue 05/06/2015, 4:32 PM

## 2015-05-08 ENCOUNTER — Other Ambulatory Visit: Payer: Self-pay | Admitting: Psychiatry

## 2015-05-15 ENCOUNTER — Other Ambulatory Visit: Payer: Self-pay | Admitting: Endocrinology

## 2015-05-20 NOTE — Telephone Encounter (Signed)
spoke with patient. pt does have a rx for prescription.  she states it must have been a automatic system request.

## 2015-05-24 ENCOUNTER — Ambulatory Visit (INDEPENDENT_AMBULATORY_CARE_PROVIDER_SITE_OTHER): Payer: BC Managed Care – PPO | Admitting: Psychiatry

## 2015-05-24 ENCOUNTER — Encounter: Payer: Self-pay | Admitting: Psychiatry

## 2015-05-24 VITALS — BP 122/98 | HR 99 | Temp 98.9°F | Ht 64.0 in | Wt 160.6 lb

## 2015-05-24 DIAGNOSIS — F411 Generalized anxiety disorder: Secondary | ICD-10-CM

## 2015-05-24 DIAGNOSIS — F331 Major depressive disorder, recurrent, moderate: Secondary | ICD-10-CM | POA: Diagnosis not present

## 2015-05-24 MED ORDER — DESVENLAFAXINE SUCCINATE ER 50 MG PO TB24: 50.0000 mg | ORAL_TABLET | ORAL | Status: DC

## 2015-05-24 NOTE — Progress Notes (Signed)
[place] MD/PA/NP OP Progress Note  05/24/2015 3:24 PM Kaylee Gordon  MRN:  [id number]  Subjective:  Patient returns for follow-up of anxiety and major depression. He states that she's felt slightly better remaining out of work. She states she's been able to go to some of the events not have as much anxiety. 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 She states that while she's been now she feels like a great weight has been lifted off of her shoulders. She states she has been to enjoy some college visits with her daughter and some sporting events. However she states that her anxiety is easily triggered when she begins to think about the work environment. For example she states when she gets in the mail she becomes an extremely anxious. Chief Complaint: anxiety  Visit Diagnosis:  No diagnosis found.  Past Medical History:  Past Medical History  Diagnosis Date  . Anxiety   . Depression   . Thyroid disease   . Cancer [facility])     SKIN CANCER  . CHF (congestive heart failure) [facility])     Past Surgical History  Procedure Laterality Date  . Appendectomy    . Breast enhancement surgery    . Skin cancer excision    . Nasal sinus surgery    . Hemorrhoid surgery    . Colonoscopy with propofol N/A 02/22/2015    Procedure: COLONOSCOPY WITH PROPOFOL;  Surgeon: Josefine Class, MD;  Location: [facility] ENDOSCOPY;  Service: Endoscopy;  Laterality: N/A;  . Esophagogastroduodenoscopy (egd) with propofol N/A 02/22/2015    Procedure: ESOPHAGOGASTRODUODENOSCOPY (EGD) WITH PROPOFOL;  Surgeon: Josefine Class, MD;  Location: [facility] ENDOSCOPY;  Service: Endoscopy;  Laterality: N/A;  . Savory dilation N/A 02/22/2015    Procedure: SAVORY DILATION;  Surgeon: Josefine Class, MD;  Location: [facility] ENDOSCOPY;  Service: Endoscopy;  Laterality: N/A;   Family History:  Family History  Problem Relation Age of Onset  . Cancer Mother   . Depression Mother   . Anxiety  disorder Mother   . Heart disease Father   . Cancer Father   . Anxiety disorder Father   . Depression Father   . Hypoparathyroidism Son    Social History:  Social History   Social History  . Marital Status: Married    Spouse Name: N/A  . Number of Children: N/A  . Years of Education: N/A   Social History Main Topics  . Smoking status: Never Smoker   . Smokeless tobacco: Never Used  . Alcohol Use: 0.0 oz/week    0 Standard drinks or equivalent per week     Comment: MAYBE ONCE OR TWICE A MONTH  . Drug Use: No  . Sexual Activity: No   Other Topics Concern  . Not on file   Social History Narrative   Additional History:   Assessment:   Musculoskeletal: Strength & Muscle Tone: within normal limits Gait & Station: normal Patient leans: N/A  Psychiatric Specialty Exam: Anxiety Symptoms include nervous/anxious behavior (surrounding work). Patient reports no insomnia (hypersomnia) or suicidal ideas.    Depression        Associated symptoms include does not have insomnia (hypersomnia) and no suicidal ideas.  Past medical history includes anxiety.     Review of Systems  Psychiatric/Behavioral: Negative for depression, suicidal ideas, hallucinations, memory loss and substance abuse. The patient is nervous/anxious (surrounding work). The patient does not have insomnia (hypersomnia).   All other systems  reviewed and are negative.   There were no vitals taken for this visit.There is no weight on file to calculate BMI.  General Appearance: Neat and Well Groomed  Eye Contact:  Good  Speech:  Clear and Coherent and Normal Rate  Volume:  Normal  Mood:  good  Affect:  Anxious and tearful  Thought Process:  Linear and Logical  Orientation:  Full (Time, Place, and Person)  Thought Content:  Negative  Suicidal Thoughts:  No  Homicidal Thoughts:  No  Memory:  Immediate;   Good Recent;   Good Remote;   Good  Judgement:  Good  Insight:  Good  Psychomotor Activity:  Negative  and Normal  Concentration:  Good  Recall:  Good  Fund of Knowledge: Good  Language: Good  Akathisia:  Negative  Handed:  Right  AIMS (if indicated):  Not done  Assets:  Communication Skills Desire for Improvement Vocational/Educational  ADL's:  Intact  Cognition: WNL  Sleep:  Sleeping more on weekends    Is the patient at risk to self?  No. Has the patient been a risk to self in the past 6 months?  No. Has the patient been a risk to self within the distant past?  No. Is the patient a risk to others?  No. Has the patient been a risk to others in the past 6 months?  No. Has the patient been a risk to others within the distant past?  No.  Current Medications: Current Outpatient Prescriptions  Medication Sig Dispense Refill  . albuterol (PROVENTIL, VENTOLIN) (5 MG/ML) 0.5% NEBU Inhale into the lungs.    . calcitRIOL (ROCALTROL) 0.25 MCG capsule TAKE 1 CAPSULE BY MOUTH DAILY. 30 capsule 0  . celecoxib (CELEBREX) 200 MG capsule Take 200 mg by mouth 2 (two) times daily.    Marland Kitchen desvenlafaxine (PRISTIQ) 50 MG 24 hr tablet Take 1 tablet (50 mg total) by mouth every morning. 30 tablet 2  . diazepam (VALIUM) 10 MG tablet Take 1 tablet (10 mg total) by mouth 2 (two) times daily. 60 tablet 1  . fluticasone (FLONASE) 50 MCG/ACT nasal spray     . levothyroxine (SYNTHROID, LEVOTHROID) 75 MCG tablet TAKE 1 TABLET (75 MCG TOTAL) BY MOUTH DAILY. 30 tablet 2  . lisinopril (PRINIVIL,ZESTRIL) 5 MG tablet TAKE 1 TABLET BY MOUTH ONCE A DAY    . lovastatin (MEVACOR) 20 MG tablet Take 20 mg by mouth at bedtime.    . metoprolol tartrate (LOPRESSOR) 25 MG tablet Take by mouth.    Marland Kitchen omeprazole (PRILOSEC) 40 MG capsule     . PROVENTIL HFA 108 (90 BASE) MCG/ACT inhaler     . QUEtiapine (SEROQUEL) 200 MG tablet TAKE 1 TABLET (200 MG TOTAL) BY MOUTH AT BEDTIME. 30 tablet 1  . ZIANA gel APPLY TO AFFECTED AREA ON THE SKIN AT BEDTIME  3  . ZOSTAVAX 56387 UNT/0.65ML injection      No current facility-administered  medications for this visit.    Medical Decision Making:  Established Problem, Stable/Improving (1)  Treatment Plan Summary:Medication management Major depressive disorder- Continue Pristiq 50 mg in the morning. She feels the Pristiq has helped her mood. She will continue her Seroquel 200 mg at bedtime.    Generalized anxieties order-this appears to be worsened but also the timing seems consistent with some stressors in the work environment. She'll continue her Valium 10 mg twice a day. She relates that others have commented that she appears to have a more stable mood since she  has been out of work.  At this time it appears that the patient's anxiety is severe and debilitating and appears to be triggered by her work environment. At this time it makes sense for her to go on short-term disability. She has dropped off paperwork for which I will complete.  She'll follow-up in 2 weeks. She's been encouraged calling questions concerns prior to her next appointment. Faith Rogue 05/24/2015, 3:24 PM

## 2015-05-25 ENCOUNTER — Encounter: Payer: Self-pay | Admitting: Psychiatry

## 2015-05-26 ENCOUNTER — Ambulatory Visit: Payer: Self-pay | Admitting: Psychiatry

## 2015-05-26 ENCOUNTER — Other Ambulatory Visit: Payer: Self-pay | Admitting: Gastroenterology

## 2015-05-26 DIAGNOSIS — R1084 Generalized abdominal pain: Secondary | ICD-10-CM

## 2015-05-26 DIAGNOSIS — K529 Noninfective gastroenteritis and colitis, unspecified: Secondary | ICD-10-CM

## 2015-05-26 DIAGNOSIS — R634 Abnormal weight loss: Secondary | ICD-10-CM

## 2015-05-28 ENCOUNTER — Telehealth: Payer: Self-pay

## 2015-05-28 NOTE — Telephone Encounter (Signed)
prior authorization needed for pristiq 50mg 

## 2015-05-28 NOTE — Telephone Encounter (Signed)
spoke with Kaylee Gordon  approved 04-27-15 end  05-26-16 case # [id number]

## 2015-06-01 ENCOUNTER — Ambulatory Visit
Admission: RE | Admit: 2015-06-01 | Discharge: 2015-06-01 | Disposition: A | Discharge status: Home / Self Care | Payer: BC Managed Care – PPO | Source: Ambulatory Visit | Attending: Gastroenterology | Admitting: Gastroenterology

## 2015-06-01 DIAGNOSIS — R634 Abnormal weight loss: Secondary | ICD-10-CM

## 2015-06-01 DIAGNOSIS — K529 Noninfective gastroenteritis and colitis, unspecified: Secondary | ICD-10-CM

## 2015-06-01 DIAGNOSIS — R1084 Generalized abdominal pain: Secondary | ICD-10-CM

## 2015-06-01 HISTORY — DX: Unspecified asthma, uncomplicated: J45.909

## 2015-06-01 HISTORY — DX: Essential (primary) hypertension: I10

## 2015-06-01 MED ORDER — IOHEXOL 300 MG/ML  SOLN
100.0000 mL | Freq: Once | INTRAMUSCULAR | Status: AC | PRN
  Administered 2015-06-01: 100 mL via INTRAVENOUS

## 2015-06-09 ENCOUNTER — Ambulatory Visit (INDEPENDENT_AMBULATORY_CARE_PROVIDER_SITE_OTHER): Payer: BC Managed Care – PPO | Admitting: Psychiatry

## 2015-06-09 ENCOUNTER — Encounter: Payer: Self-pay | Admitting: Psychiatry

## 2015-06-09 VITALS — BP 110/72 | HR 90 | Temp 99.1°F | Ht 64.0 in | Wt 160.0 lb

## 2015-06-09 DIAGNOSIS — F411 Generalized anxiety disorder: Secondary | ICD-10-CM

## 2015-06-09 DIAGNOSIS — F331 Major depressive disorder, recurrent, moderate: Secondary | ICD-10-CM

## 2015-06-09 MED ORDER — QUETIAPINE FUMARATE 200 MG PO TABS: 200.0000 mg | ORAL_TABLET | Freq: Every day | ORAL | Status: DC

## 2015-06-09 NOTE — Progress Notes (Signed)
[place] MD/PA/NP OP Progress Note  06/09/2015 3:02 PM Kaylee Gordon  MRN:  [id number]  Subjective:  Patient returns for follow-up of anxiety and major depression. She states today that she continues to have her good and bad days. 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 She states that when she got there she broke down. Patient states that she has been less stress and others around her, husband and daughter have noticed that she is emotionally more stable and not shaking. Patient states that she was in the habit of checking her emails for work but noted that she forgot to check one day and felt some relief that day because she had not checked her email.  Patient states she feels like she has more energy. Chief Complaint: anxiety Chief Complaint    Follow-up; Medication Refill     Visit Diagnosis:     ICD-9-CM ICD-10-CM   1. GAD (generalized anxiety disorder) 300.02 F41.1   2. Major depressive disorder, recurrent episode, moderate (HCC) 296.32 F33.1     Past Medical History:  Past Medical History  Diagnosis Date  . Anxiety   . Depression   . Thyroid disease   . CHF (congestive heart failure) ([place])   . Cancer [facility])     SKIN CANCER  . Hypertension   . Asthma     Past Surgical History  Procedure Laterality Date  . Appendectomy    . Breast enhancement surgery    . Skin cancer excision    . Nasal sinus surgery    . Hemorrhoid surgery    . Colonoscopy with propofol N/A 02/22/2015    Procedure: COLONOSCOPY WITH PROPOFOL;  Surgeon: Josefine Class, MD;  Location: [facility] ENDOSCOPY;  Service: Endoscopy;  Laterality: N/A;  . Esophagogastroduodenoscopy (egd) with propofol N/A 02/22/2015    Procedure: ESOPHAGOGASTRODUODENOSCOPY (EGD) WITH PROPOFOL;  Surgeon: Josefine Class, MD;  Location: [facility] ENDOSCOPY;  Service: Endoscopy;  Laterality: N/A;  . Savory dilation N/A  02/22/2015    Procedure: SAVORY DILATION;  Surgeon: Josefine Class, MD;  Location: [facility] ENDOSCOPY;  Service: Endoscopy;  Laterality: N/A;   Family History:  Family History  Problem Relation Age of Onset  . Cancer Mother   . Depression Mother   . Anxiety disorder Mother   . Heart disease Father   . Cancer Father   . Anxiety disorder Father   . Depression Father   . Hypoparathyroidism Son    Social History:  Social History   Social History  . Marital Status: Married    Spouse Name: N/A  . Number of Children: N/A  . Years of Education: N/A   Social History Main Topics  . Smoking status: Never Smoker   . Smokeless tobacco: Never Used  . Alcohol Use: 0.0 oz/week    0 Standard drinks or equivalent per week     Comment: MAYBE ONCE OR TWICE A MONTH  . Drug Use: No  . Sexual Activity: No   Other Topics Concern  . None   Social History Narrative   Additional History:   Assessment:   Musculoskeletal: Strength & Muscle Tone: within normal limits Gait & Station: normal Patient leans: N/A  Psychiatric Specialty Exam: Anxiety Symptoms include nervous/anxious behavior (surrounding work). Patient reports no insomnia or suicidal ideas.    Depression        Associated symptoms include  does not have insomnia and no suicidal ideas.  Past medical history includes anxiety.     Review of Systems  Psychiatric/Behavioral: Negative for depression, suicidal ideas, hallucinations, memory loss and substance abuse. The patient is nervous/anxious (surrounding work). The patient does not have insomnia.   All other systems reviewed and are negative.   Blood pressure 110/72, pulse 90, temperature 99.1 F (37.3 C), temperature source Tympanic, height 5\' 4"  (1.626 m), weight 160 lb (72.576 kg), SpO2 99 %.Body mass index is 27.45 kg/(m^2).  General Appearance: Neat and Well Groomed  Eye Contact:  Good  Speech:  Clear and Coherent and Normal Rate  Volume:  Normal  Mood:  good  Affect:   Anxious and tearful  Thought Process:  Linear and Logical  Orientation:  Full (Time, Place, and Person)  Thought Content:  Negative  Suicidal Thoughts:  No  Homicidal Thoughts:  No  Memory:  Immediate;   Good Recent;   Good Remote;   Good  Judgement:  Good  Insight:  Good  Psychomotor Activity:  Negative and Normal  Concentration:  Good  Recall:  Good  Fund of Knowledge: Good  Language: Good  Akathisia:  Negative  Handed:  Right  AIMS (if indicated):  Not done  Assets:  Communication Skills Desire for Improvement Vocational/Educational  ADL's:  Intact  Cognition: WNL  Sleep:  Sleeping more on weekends    Is the patient at risk to self?  No. Has the patient been a risk to self in the past 6 months?  No. Has the patient been a risk to self within the distant past?  No. Is the patient a risk to others?  No. Has the patient been a risk to others in the past 6 months?  No. Has the patient been a risk to others within the distant past?  No.  Current Medications: Current Outpatient Prescriptions  Medication Sig Dispense Refill  . albuterol (PROVENTIL, VENTOLIN) (5 MG/ML) 0.5% NEBU Inhale into the lungs.    . calcitRIOL (ROCALTROL) 0.25 MCG capsule TAKE 1 CAPSULE BY MOUTH DAILY. 30 capsule 0  . celecoxib (CELEBREX) 200 MG capsule Take 200 mg by mouth 2 (two) times daily.    Marland Kitchen desvenlafaxine (PRISTIQ) 50 MG 24 hr tablet Take 1 tablet (50 mg total) by mouth every morning. 30 tablet 2  . desvenlafaxine (PRISTIQ) 50 MG 24 hr tablet Take by mouth.    . diazepam (VALIUM) 10 MG tablet Take 1 tablet (10 mg total) by mouth 2 (two) times daily. 60 tablet 1  . dicyclomine (BENTYL) 10 MG capsule Take by mouth.    . dicyclomine (BENTYL) 10 MG capsule     . fluticasone (FLONASE) 50 MCG/ACT nasal spray     . levothyroxine (SYNTHROID, LEVOTHROID) 50 MCG tablet Take by mouth.    Marland Kitchen lisinopril (PRINIVIL,ZESTRIL) 5 MG tablet TAKE 1 TABLET BY MOUTH ONCE A DAY    . lovastatin (MEVACOR) 20 MG tablet  Take 20 mg by mouth at bedtime.    . metoprolol tartrate (LOPRESSOR) 25 MG tablet Take by mouth.    Marland Kitchen omeprazole (PRILOSEC) 40 MG capsule     . PROVENTIL HFA 108 (90 BASE) MCG/ACT inhaler     . QUEtiapine (SEROQUEL) 200 MG tablet Take 1 tablet (200 mg total) by mouth at bedtime. 30 tablet 1  . ZIANA gel APPLY TO AFFECTED AREA ON THE SKIN AT BEDTIME  3  . ZOSTAVAX [id number] UNT/0.65ML injection      No current facility-administered medications  for this visit.    Medical Decision Making:  Established Problem, Stable/Improving (1)  Treatment Plan Summary:Medication management Major depressive disorder- Continue Pristiq 50 mg in the morning. She feels the Pristiq has helped her mood. She will continue her Seroquel 200 mg at bedtime.    Generalized anxiety disorder-it appears things have improved since she has been further out from work.   Patient indicates she is currently on short-term disability and has been working with her management to make a decision as to whether to go ahead and retire or go on long-term disability.  She'll follow-up in 2 weeks. She's been encouraged call with questions concerns prior to her next appointment. Faith Rogue 06/09/2015, 3:02 PM

## 2015-06-16 ENCOUNTER — Other Ambulatory Visit: Payer: Self-pay | Admitting: Psychiatry

## 2015-06-16 ENCOUNTER — Other Ambulatory Visit: Payer: Self-pay | Admitting: Endocrinology

## 2015-06-16 NOTE — Telephone Encounter (Signed)
faxed rx for valium 10mg  - id # [id number] order # [id number] faxed and confirmed

## 2015-06-17 DIAGNOSIS — R0681 Apnea, not elsewhere classified: Secondary | ICD-10-CM | POA: Insufficient documentation

## 2015-06-22 DIAGNOSIS — I10 Essential (primary) hypertension: Secondary | ICD-10-CM | POA: Insufficient documentation

## 2015-06-22 DIAGNOSIS — E782 Mixed hyperlipidemia: Secondary | ICD-10-CM | POA: Insufficient documentation

## 2015-06-23 ENCOUNTER — Encounter: Payer: Self-pay | Admitting: Psychiatry

## 2015-06-23 ENCOUNTER — Ambulatory Visit (INDEPENDENT_AMBULATORY_CARE_PROVIDER_SITE_OTHER): Payer: BC Managed Care – PPO | Admitting: Psychiatry

## 2015-06-23 VITALS — BP 122/78 | HR 111 | Temp 98.9°F | Ht 64.0 in | Wt 166.2 lb

## 2015-06-23 DIAGNOSIS — F411 Generalized anxiety disorder: Secondary | ICD-10-CM

## 2015-06-23 DIAGNOSIS — F331 Major depressive disorder, recurrent, moderate: Secondary | ICD-10-CM | POA: Diagnosis not present

## 2015-06-23 NOTE — Progress Notes (Signed)
[place] MD/PA/NP OP Progress Note  06/23/2015 4:12 PM SUMAKO STOCKLIN  MRN:  [id number]  Subjective:  Patient returns for follow-up of anxiety and major depression. She states overall things are going fairly well she does states she has her bad days and her good days. She believes she is having more good days than bad. She states she is struggling with feeling like a failure and Ronalee Belts she might be quitting by not being at work. However she does point notices her anxiety is under slightly better control. She is worried now about an upcoming stress test. She feels very fatigued and she does not feel like she is going to be able to participate in the stress test significantly.  She states that others around her feel like she is doing better. She states her kids commented that they do not want her to go back to work. She states she is able to go out and about and enjoy things but she states there are other times where she just wants to stay in the house and not leave. He states that what can exacerbate her anxiety is often when her kids fight. She states her daughter sometimes is worried about her upcoming college acceptance and then in turn the patient states she worries about this. She states that she is getting some distance from her emails related to work and finds that she is somewhat less stress since she's been able to distance herself from that correspondence.   Chief Complaint: anxiety Chief Complaint    Follow-up; Medication Refill     Visit Diagnosis:     ICD-9-CM ICD-10-CM   1. GAD (generalized anxiety disorder) 300.02 F41.1   2. Major depressive disorder, recurrent episode, moderate (HCC) 296.32 F33.1   3. Generalized anxiety disorder 300.02 F41.1     Past Medical History:  Past Medical History  Diagnosis Date  . Anxiety   . Depression   . Thyroid disease   . CHF (congestive heart failure) ([place])   . Cancer [facility])     SKIN CANCER  . Hypertension   . Asthma     Past Surgical History   Procedure Laterality Date  . Appendectomy    . Breast enhancement surgery    . Skin cancer excision    . Nasal sinus surgery    . Hemorrhoid surgery    . Colonoscopy with propofol N/A 02/22/2015    Procedure: COLONOSCOPY WITH PROPOFOL;  Surgeon: Josefine Class, MD;  Location: [facility] ENDOSCOPY;  Service: Endoscopy;  Laterality: N/A;  . Esophagogastroduodenoscopy (egd) with propofol N/A 02/22/2015    Procedure: ESOPHAGOGASTRODUODENOSCOPY (EGD) WITH PROPOFOL;  Surgeon: Josefine Class, MD;  Location: [facility] ENDOSCOPY;  Service: Endoscopy;  Laterality: N/A;  . Savory dilation N/A 02/22/2015    Procedure: SAVORY DILATION;  Surgeon: Josefine Class, MD;  Location: [facility] ENDOSCOPY;  Service: Endoscopy;  Laterality: N/A;   Family History:  Family History  Problem Relation Age of Onset  . Cancer Mother   . Depression Mother   . Anxiety disorder Mother   . Heart disease Father   . Cancer Father   . Anxiety disorder Father   . Depression Father   . Hypoparathyroidism Son    Social History:  Social History   Social History  . Marital Status: Married    Spouse Name: N/A  . Number of Children: N/A  . Years of Education: N/A   Social History Main Topics  . Smoking status: Never Smoker   . Smokeless tobacco:  Never Used  . Alcohol Use: 0.0 oz/week    0 Standard drinks or equivalent per week     Comment: MAYBE ONCE OR TWICE A MONTH  . Drug Use: No  . Sexual Activity: No   Other Topics Concern  . None   Social History Narrative   Additional History:   Assessment:   Musculoskeletal: Strength & Muscle Tone: within normal limits Gait & Station: normal Patient leans: N/A  Psychiatric Specialty Exam: Anxiety Symptoms include nervous/anxious behavior (surrounding work). Patient reports no insomnia or suicidal ideas.    Depression        Associated symptoms include does not have insomnia and no suicidal ideas.  Past medical history includes anxiety.     Review of Systems   Psychiatric/Behavioral: Negative for depression, suicidal ideas, hallucinations, memory loss and substance abuse. The patient is nervous/anxious (surrounding work). The patient does not have insomnia.   All other systems reviewed and are negative.   Blood pressure 122/78, pulse 111, temperature 98.9 F (37.2 C), temperature source Tympanic, height 5\' 4"  (1.626 m), weight 166 lb 3.2 oz (75.388 kg), SpO2 97 %.Body mass index is 28.51 kg/(m^2).  General Appearance: Neat and Well Groomed  Eye Contact:  Good  Speech:  Clear and Coherent and Normal Rate  Volume:  Normal  Mood:  good  Affect:  Anxious and tearful  Thought Process:  Linear and Logical  Orientation:  Full (Time, Place, and Person)  Thought Content:  Negative  Suicidal Thoughts:  No  Homicidal Thoughts:  No  Memory:  Immediate;   Good Recent;   Good Remote;   Good  Judgement:  Good  Insight:  Good  Psychomotor Activity:  Negative and Normal  Concentration:  Good  Recall:  Good  Fund of Knowledge: Good  Language: Good  Akathisia:  Negative  Handed:  Right  AIMS (if indicated):  Not done  Assets:  Communication Skills Desire for Improvement Vocational/Educational  ADL's:  Intact  Cognition: WNL  Sleep:  Sleeping more on weekends    Is the patient at risk to self?  No. Has the patient been a risk to self in the past 6 months?  No. Has the patient been a risk to self within the distant past?  No. Is the patient a risk to others?  No. Has the patient been a risk to others in the past 6 months?  No. Has the patient been a risk to others within the distant past?  No.  Current Medications: Current Outpatient Prescriptions  Medication Sig Dispense Refill  . albuterol (PROVENTIL, VENTOLIN) (5 MG/ML) 0.5% NEBU Inhale into the lungs.    . calcitRIOL (ROCALTROL) 0.25 MCG capsule TAKE 1 CAPSULE BY MOUTH DAILY. 30 capsule 0  . celecoxib (CELEBREX) 200 MG capsule Take 200 mg by mouth 2 (two) times daily.    Marland Kitchen desvenlafaxine  (PRISTIQ) 50 MG 24 hr tablet Take 1 tablet (50 mg total) by mouth every morning. 30 tablet 2  . desvenlafaxine (PRISTIQ) 50 MG 24 hr tablet Take by mouth.    . diazepam (VALIUM) 10 MG tablet TAKE 1 TABLET TWICE DAILY 60 tablet 2  . dicyclomine (BENTYL) 10 MG capsule Take by mouth.    . fluticasone (FLONASE) 50 MCG/ACT nasal spray     . levothyroxine (SYNTHROID, LEVOTHROID) 50 MCG tablet Take by mouth.    Marland Kitchen lisinopril (PRINIVIL,ZESTRIL) 5 MG tablet TAKE 1 TABLET BY MOUTH ONCE A DAY    . lovastatin (MEVACOR) 20 MG tablet Take  20 mg by mouth at bedtime.    . metoprolol tartrate (LOPRESSOR) 25 MG tablet Take by mouth.    Marland Kitchen omeprazole (PRILOSEC) 40 MG capsule     . PROVENTIL HFA 108 (90 BASE) MCG/ACT inhaler     . QUEtiapine (SEROQUEL) 200 MG tablet Take 1 tablet (200 mg total) by mouth at bedtime. 30 tablet 1  . ZIANA gel APPLY TO AFFECTED AREA ON THE SKIN AT BEDTIME  3  . ZOSTAVAX [id number] UNT/0.65ML injection     . calcitRIOL (ROCALTROL) 0.25 MCG capsule TAKE 1 CAPSULE BY MOUTH DAILY. (Patient not taking: Reported on 06/23/2015) 30 capsule 0  . dicyclomine (BENTYL) 10 MG capsule     . QUEtiapine (SEROQUEL) 200 MG tablet TAKE 1 TABLET (200 MG TOTAL) BY MOUTH AT BEDTIME. (Patient not taking: Reported on 06/23/2015) 30 tablet 3   No current facility-administered medications for this visit.    Medical Decision Making:  Established Problem, Stable/Improving (1)  Treatment Plan Summary:Medication management Major depressive disorder- Continue Pristiq 50 mg in the morning. She feels the Pristiq has helped her mood. She will continue her Seroquel 200 mg at bedtime.    Generalized anxiety disorder-this still continues to be an issue but was certainly exacerbated by work related issues. While the school-related issues may have decreased it does appear anxiety is persisting through other issues such as medical problems and assessments, children and her interactions with her father and in-laws.   Patient  indicates she is currently on short-term disability and has been working with her management to make a decision as to whether to go ahead and retire or go on long-term disability.  She'll follow-up in 2 weeks. She's been encouraged call with questions concerns prior to her next appointment. Faith Rogue 06/23/2015, 4:12 PM

## 2015-07-02 ENCOUNTER — Ambulatory Visit: Payer: BC Managed Care – PPO | Admitting: Endocrinology

## 2015-07-07 ENCOUNTER — Encounter: Payer: Self-pay | Admitting: Psychiatry

## 2015-07-07 ENCOUNTER — Ambulatory Visit (INDEPENDENT_AMBULATORY_CARE_PROVIDER_SITE_OTHER): Payer: BC Managed Care – PPO | Admitting: Psychiatry

## 2015-07-07 VITALS — BP 122/78 | HR 100 | Temp 98.9°F | Ht 64.0 in | Wt 165.4 lb

## 2015-07-07 DIAGNOSIS — F331 Major depressive disorder, recurrent, moderate: Secondary | ICD-10-CM

## 2015-07-07 DIAGNOSIS — F411 Generalized anxiety disorder: Secondary | ICD-10-CM

## 2015-07-07 NOTE — Progress Notes (Addendum)
BH MD/PA/NP OP Progress Note  07/07/2015 3:07 PM Kaylee Gordon  MRN:  [id number]  Subjective:  Patient returns for follow-up of anxiety and major depression. She indicates that she continues to have issues with anxiety and depression. She indicated that she probably is having more good days than bad. She cites example yesterday was not a good day because she did not leave the house. She states that she has experienced that when she drives by her former high school she feels nervous and anxious. She also describes significant anxiety that is heightened when she is out with relatives at events. She states that seeing her father always provokes significant anxiety and she does not do well after those visits. She states that she has an upcoming visit with her in-laws which is somewhat stressful but she states that she can make out okay there because her other people and she will not be as much of the focus of attention.    Chief Complaint: anxiety Chief Complaint    Follow-up; Medication Refill     Visit Diagnosis:     ICD-9-CM ICD-10-CM   1. GAD (generalized anxiety disorder) 300.02 F41.1   2. Major depressive disorder, recurrent episode, moderate (HCC) 296.32 F33.1   3. Generalized anxiety disorder 300.02 F41.1     Past Medical History:  Past Medical History  Diagnosis Date  . Anxiety   . Depression   . Thyroid disease   . CHF (congestive heart failure) ([place])   . Cancer [facility])     SKIN CANCER  . Hypertension   . Asthma     Past Surgical History  Procedure Laterality Date  . Appendectomy    . Breast enhancement surgery    . Skin cancer excision    . Nasal sinus surgery    . Hemorrhoid surgery    . Colonoscopy with propofol N/A 02/22/2015    Procedure: COLONOSCOPY WITH PROPOFOL;  Surgeon: Josefine Class, MD;  Location: [facility] ENDOSCOPY;  Service: Endoscopy;  Laterality: N/A;  . Esophagogastroduodenoscopy (egd) with propofol N/A 02/22/2015    Procedure:  ESOPHAGOGASTRODUODENOSCOPY (EGD) WITH PROPOFOL;  Surgeon: Josefine Class, MD;  Location: [facility] ENDOSCOPY;  Service: Endoscopy;  Laterality: N/A;  . Savory dilation N/A 02/22/2015    Procedure: SAVORY DILATION;  Surgeon: Josefine Class, MD;  Location: [facility] ENDOSCOPY;  Service: Endoscopy;  Laterality: N/A;   Family History:  Family History  Problem Relation Age of Onset  . Cancer Mother   . Depression Mother   . Anxiety disorder Mother   . Heart disease Father   . Cancer Father   . Anxiety disorder Father   . Depression Father   . Hypoparathyroidism Son    Social History:  Social History   Social History  . Marital Status: Married    Spouse Name: N/A  . Number of Children: N/A  . Years of Education: N/A   Social History Main Topics  . Smoking status: Never Smoker   . Smokeless tobacco: Never Used  . Alcohol Use: 0.0 oz/week    0 Standard drinks or equivalent per week     Comment: MAYBE ONCE OR TWICE A MONTH  . Drug Use: No  . Sexual Activity: No   Other Topics Concern  . None   Social History Narrative   Additional History:   Assessment:  Patient thanked Probation officer for trying to help her and gave Probation officer an unsolicited hug. Musculoskeletal: Strength & Muscle Tone: within normal limits Gait & Station: normal Patient leans:  N/A  Psychiatric Specialty Exam: Anxiety Symptoms include nervous/anxious behavior (surrounding work). Patient reports no insomnia or suicidal ideas.    Depression        Associated symptoms include does not have insomnia and no suicidal ideas.  Past medical history includes anxiety.     Review of Systems  Psychiatric/Behavioral: Negative for depression, suicidal ideas, hallucinations, memory loss and substance abuse. The patient is nervous/anxious (surrounding work). The patient does not have insomnia.   All other systems reviewed and are negative.   Blood pressure 122/78, pulse 100, temperature 98.9 F (37.2 C), temperature source  Tympanic, height 5\' 4"  (1.626 m), weight 165 lb 6.4 oz (75.025 kg), SpO2 97 %.Body mass index is 28.38 kg/(m^2).  General Appearance: Neat and Well Groomed  Eye Contact:  Good  Speech:  Clear and Coherent and Normal Rate  Volume:  Normal  Mood:  good  Affect:  Anxious and and appeared to be somewhat near tears but was able to contain them   Thought Process:  Linear and Logical  Orientation:  Full (Time, Place, and Person)  Thought Content:  Negative  Suicidal Thoughts:  No  Homicidal Thoughts:  No  Memory:  Immediate;   Good Recent;   Good Remote;   Good  Judgement:  Good  Insight:  Good  Psychomotor Activity:  Negative and Normal  Concentration:  Good  Recall:  Good  Fund of Knowledge: Good  Language: Good  Akathisia:  Negative  Handed:  Right  AIMS (if indicated):  Not done  Assets:  Communication Skills Desire for Improvement Vocational/Educational  ADL's:  Intact  Cognition: WNL  Sleep:  Sleeping more on weekends    Is the patient at risk to self?  No. Has the patient been a risk to self in the past 6 months?  No. Has the patient been a risk to self within the distant past?  No. Is the patient a risk to others?  No. Has the patient been a risk to others in the past 6 months?  No. Has the patient been a risk to others within the distant past?  No.  Current Medications: Current Outpatient Prescriptions  Medication Sig Dispense Refill  . albuterol (PROVENTIL, VENTOLIN) (5 MG/ML) 0.5% NEBU Inhale into the lungs. Reported on 07/07/2015    . calcitRIOL (ROCALTROL) 0.25 MCG capsule TAKE 1 CAPSULE BY MOUTH DAILY. 30 capsule 0  . calcitRIOL (ROCALTROL) 0.25 MCG capsule TAKE 1 CAPSULE BY MOUTH DAILY. 30 capsule 0  . celecoxib (CELEBREX) 200 MG capsule Take 200 mg by mouth 2 (two) times daily.    Marland Kitchen desvenlafaxine (PRISTIQ) 50 MG 24 hr tablet Take 1 tablet (50 mg total) by mouth every morning. 30 tablet 2  . desvenlafaxine (PRISTIQ) 50 MG 24 hr tablet Take by mouth.    .  diazepam (VALIUM) 10 MG tablet TAKE 1 TABLET TWICE DAILY 60 tablet 2  . dicyclomine (BENTYL) 10 MG capsule Take by mouth.    . dicyclomine (BENTYL) 10 MG capsule     . fluticasone (FLONASE) 50 MCG/ACT nasal spray     . levothyroxine (SYNTHROID, LEVOTHROID) 50 MCG tablet Take by mouth.    Marland Kitchen lisinopril (PRINIVIL,ZESTRIL) 5 MG tablet TAKE 1 TABLET BY MOUTH ONCE A DAY    . lovastatin (MEVACOR) 20 MG tablet Take 20 mg by mouth at bedtime.    Marland Kitchen omeprazole (PRILOSEC) 40 MG capsule     . PROVENTIL HFA 108 (90 BASE) MCG/ACT inhaler     . QUEtiapine (  SEROQUEL) 200 MG tablet Take 1 tablet (200 mg total) by mouth at bedtime. 30 tablet 1  . QUEtiapine (SEROQUEL) 200 MG tablet TAKE 1 TABLET (200 MG TOTAL) BY MOUTH AT BEDTIME. 30 tablet 3  . ZIANA gel APPLY TO AFFECTED AREA ON THE SKIN AT BEDTIME  3  . ZOSTAVAX 57846 UNT/0.65ML injection      No current facility-administered medications for this visit.    Medical Decision Making:  Established Problem, Stable/Improving (1)  Treatment Plan Summary:Medication management Major depressive disorder- Continue Pristiq 50 mg in the morning. She feels the Pristiq has helped her mood. She will continue her Seroquel 200 mg at bedtime.    Generalized anxiety disorder-this still continues to be present in multiple arenas with the exception of work at this time because she is not going to work. Continues to be an issue and is evident by visible anxiety and near tearfulness when discussing social and occupational factors that lead to her anxiety.  I discussed with her that she might need to re explore getting involved with therapy in addition to medications. Patient was in agreement with this. I provided her a list of therapists within the area as well as given her the name of a former therapist in this clinic who is now in private practice.  She'll follow-up in 4 weeks. She's been encouraged call with questions concerns prior to her next appointment.  Faith Rogue 07/07/2015, 3:07 PM

## 2015-07-08 ENCOUNTER — Ambulatory Visit: Payer: BC Managed Care – PPO | Admitting: Physical Therapy

## 2015-07-13 ENCOUNTER — Ambulatory Visit: Payer: BC Managed Care – PPO | Admitting: Physical Therapy

## 2015-07-20 ENCOUNTER — Ambulatory Visit: Payer: BC Managed Care – PPO | Admitting: Physical Therapy

## 2015-07-23 ENCOUNTER — Ambulatory Visit: Payer: BC Managed Care – PPO | Attending: Gastroenterology | Admitting: Physical Therapy

## 2015-07-23 ENCOUNTER — Encounter: Payer: Self-pay | Admitting: Physical Therapy

## 2015-07-23 DIAGNOSIS — R2689 Other abnormalities of gait and mobility: Secondary | ICD-10-CM | POA: Diagnosis present

## 2015-07-23 DIAGNOSIS — R279 Unspecified lack of coordination: Secondary | ICD-10-CM | POA: Diagnosis present

## 2015-07-23 DIAGNOSIS — M629 Disorder of muscle, unspecified: Secondary | ICD-10-CM | POA: Diagnosis present

## 2015-07-23 DIAGNOSIS — M6289 Other specified disorders of muscle: Secondary | ICD-10-CM

## 2015-07-23 NOTE — Patient Instructions (Signed)
    Buy a bar extensor, place bra near elbow height to allow diaphragm to expand and better breathing mechanics  Bedtime Routine:  1. Diaphragmatic breathing: See handout   (10-15 x)  2. Scar Massage over breasts and above pubic bone from surgeries  (see handout)   5 min   Morning Routine:  Log rolling  (Handout)

## 2015-07-23 NOTE — Therapy (Addendum)
[place] MAIN [facility] SERVICES [address] [place], [place], [address] Phone: [phone]   Fax:  [phone]  Physical Therapy Evaluation  Patient Details  Name: Kaylee Gordon MRN: [id number] Date of Birth: [birth year] Referring Provider: Rayann Heman  Encounter Date: 07/23/2015    Past Medical History  Diagnosis Date  . Anxiety   . Depression   . Thyroid disease   . CHF (congestive heart failure) ([place])   . Cancer [facility])     SKIN CANCER  . Hypertension   . Asthma     Past Surgical History  Procedure Laterality Date  . Appendectomy    . Breast enhancement surgery    . Skin cancer excision    . Nasal sinus surgery    . Hemorrhoid surgery    . Colonoscopy with propofol N/A 02/22/2015    Procedure: COLONOSCOPY WITH PROPOFOL;  Surgeon: Josefine Class, MD;  Location: [facility] ENDOSCOPY;  Service: Endoscopy;  Laterality: N/A;  . Esophagogastroduodenoscopy (egd) with propofol N/A 02/22/2015    Procedure: ESOPHAGOGASTRODUODENOSCOPY (EGD) WITH PROPOFOL;  Surgeon: Josefine Class, MD;  Location: [facility] ENDOSCOPY;  Service: Endoscopy;  Laterality: N/A;  . Savory dilation N/A 02/22/2015    Procedure: SAVORY DILATION;  Surgeon: Josefine Class, MD;  Location: [facility] ENDOSCOPY;  Service: Endoscopy;  Laterality: N/A;  . Ankle surgery Right 2012    There were no vitals filed for this visit.  Visit Diagnosis:  Tight fascia - Plan: PT plan of care cert/re-cert  Decreased mobility - Plan: PT plan of care cert/re-cert  Lack of coordination - Plan: PT plan of care cert/re-cert  Poor balance - Plan: PT plan of care cert/re-cert      Subjective Assessment - 07/29/15 1308    Subjective 1) Diarrhea: Pt stated she was referred to [facility] by her GI doctor for a "nervous stomach". She states she a "ball of nerves"  with increased stress due to family and work situations. 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 These life stressors trigger constant diarrhea or constipation. In the past week, pt reports diarrhea 3-4x/ 7 days. Pt feels urgency to eliminate bowels with stress triggers. Pt reports leakage episodes 3x a month.  Last year, pt experienced urinary incontinence with stress. The incontinence resolved.   2) LBP/ abdominal pain : low back pain comes on when "her stomach hurts".   When she was working at her stressful job, it happened 4x/week. Curently, pt has not been working and her episodes have decreased to 4x/ month. Pt would like to apply for jobs but she is afraid her stress and anxiety will kick in again and not be able to fulfill her job responsibilities. 3) Balance: Pt reports her balance is limited due to her previous R ankle and L knee surgery.             [facility] PT Assessment - 07/29/15 1239    Assessment   Medical Diagnosis pelvic floor dysfunction    Referring Provider Rein   Precautions   Precautions None   Restrictions   Weight Bearing Restrictions No   Balance Screen   Has the patient fallen in the past 6 months No   Observation/Other Assessments   Observations genu valgus, pronated feet   thoracic kyphosis, diaphragm posterior to pelvis    Other Surveys  --  ZUNG anxiety 70%, COREFO 34%    Coordination   Gross Motor Movements are Fluid and Coordinated --  elevated ribcage, chest breathing, abdominal straining w/ BM   Fine Motor Movements are Fluid and Coordinated --  limited pelvic floor movement with breathing coordination    Single Leg Stance   Comments UE support on wall,  1 sec, bilat LOB    Posture/Postural Control   Posture/Postural Control --  lumbopelvic instability with ASLR   ROM / Strength   AROM / PROM / Strength --  limited thoracic mobility rotation bil ~25%   Palpation   Spinal mobility increased thoracic mm tensions bil   Palpation comment scar restrictions suprapubic  area and below bilateral breasts   Bed Mobility   Bed Mobility --  crunch method, cued to log roll                  Pelvic Floor Special Questions - 07/29/15 1251    Diastasis Recti 2 finger below strnum          [facility] Adult PT Treatment/Exercise - 07/29/15 1239    Self-Care   Self-Care --  POC, goals , anatomy, physiology, biopsychosocial approach    Therapeutic Activites    Therapeutic Activities --  bra placement and extensor needed to promote diaphragmatic b   Neuro Re-ed    Neuro Re-ed Details  log rolling and diaphragmatic breathing    Manual Therapy   Myofascial Release scar massage over suprapubic bone and below breasts                PT Education - 07/29/15 1256    Education provided Yes   Education Details POC, goals, anatomy, physiology, biopsychosocial approaches to treating Sx   Person(s) Educated Patient   Methods Explanation;Demonstration;Tactile cues;Handout;Verbal cues   Comprehension Verbalized understanding;Returned demonstration             PT Long Term Goals - 07/29/15 1256    PT LONG TERM GOAL #1   Title Pt will demo decreased scar restriction over suprapubic area and below breasts in order to promote diaphragmatic breathing and pelvic floor movement.    Time 12   Period Weeks   Status New   PT LONG TERM GOAL #2   Title Pt will decrease her ZUNG anxiety scale from 70% to < 50% in order to better manage IBS Sx.    Time 12   Period Weeks   Status New   PT LONG TERM GOAL #3   Title Pt will decrease her COREFO score from 34% to < 28% in order to show improved colorectal function.    Time 12   Period Weeks   Status New   PT LONG TERM GOAL #4   Title Pt will demo decreased DRA from 2 fingers to 1.5 fingers below sternum in order to improve intraabdominal pressure for digestive and cores tability functions.    Time 12   Period Weeks   PT LONG TERM GOAL #5   Title Pt will demo log rolling technique across 2 visits to  decrease straining of pelvic floor and abdominal muscles with bed mobility.    Time 12   Period Weeks   Status New               Plan - 07/29/15 1305    Clinical Impression Statement Pt is a 55 yo female whose c/o include diarrhea, LBP/abdominal pain, and poor balance which impact her QOL, ability to walk, and perform  ADLs. . Pt's personal factors include severe anxitey and stress, Hx of  R ankle and L knee surgery, Hx of abdominal and breast surgeries.  Pt's clinical presentations include poor posture, weak deep core mm strength and coordination, abdominal separation (DRA),  poor diet, poor diaphragmatic and pelvic floor movement, decreased thoacic mobility, poor balance during SLS w/ pronation of feet and genu valgus. Based on these factors, pt's condition is unstable and complexity is high.     Pt will benefit from skilled therapeutic intervention in order to improve on the following deficits Abnormal gait;Increased muscle spasms;Decreased strength;Improper body mechanics;Postural dysfunction;Difficulty walking;Decreased activity tolerance;Decreased mobility;Decreased balance;Decreased endurance;Decreased scar mobility;Increased fascial restricitons;Decreased coordination;Decreased cognition;Hypomobility;Pain;Decreased range of motion  relaxation training   Rehab Potential Good   PT Frequency 2x / week   PT Duration 12 weeks   PT Treatment/Interventions ADLs/Self Care Home Management;Cryotherapy;Aquatic Therapy;Moist Heat;Traction;Electrical Stimulation;Patient/family education;Neuromuscular re-education;Balance training;Therapeutic exercise;Therapeutic activities;Functional mobility training;Stair training;Gait training;Manual techniques;Scar mobilization;Taping;Splinting;Passive range of motion;Other (comment)  relaxation training   Consulted and Agree with Plan of Care Patient         Problem List Patient Active Problem List   Diagnosis Date Noted  . Benign essential HTN  06/22/2015  . Combined fat and carbohydrate induced hyperlipemia 06/22/2015  . Breathlessness on exertion 06/17/2015  . Pure hypercholesterolemia 04/22/2015  . Congestive heart failure with left ventricular systolic dysfunction ([place]) 03/24/2015  . Acquired hypothyroidism 02/04/2015  . Heart failure, systolic ([place]) 123456  . Anxiety 12/28/2014  . Allergic rhinitis 10/27/2014  . Bipolar 1 disorder, depressed ([place]) 10/27/2014  . Hypercholesterolemia without hypertriglyceridemia 10/27/2014  . Depression, major, recurrent, moderate ([place]) 10/27/2014  . Adult hypothyroidism 10/27/2014  . Anxiety, generalized 10/27/2014  . Essential (primary) hypertension 10/27/2014  . Colon polyp 10/27/2014  . Hypoparathyroidism ([place]) 02/14/2013    Jerl Mina ,PT, DPT, E-RYT 07/29/2015, 1:18 PM  [place] MAIN [facility] SERVICES [address] [place], [place], [address] Phone: [phone]   Fax:  [phone]  Name: Kaylee Gordon MRN: [id number] Date of Birth: 1961/05/12

## 2015-07-26 ENCOUNTER — Other Ambulatory Visit: Payer: Self-pay | Admitting: Psychiatry

## 2015-07-27 NOTE — Telephone Encounter (Signed)
left message asking patient to please call our office.

## 2015-07-27 NOTE — Telephone Encounter (Signed)
called they gave patient a 90 day supply since she had the 2 refills.  according to pharmacy they were just notifing [place].  (pt should have enought medcations)

## 2015-07-28 ENCOUNTER — Ambulatory Visit (INDEPENDENT_AMBULATORY_CARE_PROVIDER_SITE_OTHER): Payer: BC Managed Care – PPO | Admitting: Endocrinology

## 2015-07-28 ENCOUNTER — Encounter: Payer: Self-pay | Admitting: Endocrinology

## 2015-07-28 VITALS — BP 126/76 | HR 114 | Temp 97.8°F | Resp 14 | Ht 64.0 in | Wt 163.6 lb

## 2015-07-28 DIAGNOSIS — E209 Hypoparathyroidism, unspecified: Secondary | ICD-10-CM | POA: Diagnosis not present

## 2015-07-28 DIAGNOSIS — E200 Idiopathic hypoparathyroidism: Secondary | ICD-10-CM

## 2015-07-28 DIAGNOSIS — E038 Other specified hypothyroidism: Secondary | ICD-10-CM | POA: Diagnosis not present

## 2015-07-28 LAB — BASIC METABOLIC PANEL
BUN: 21 mg/dL (ref 6–23)
CHLORIDE: 105 meq/L (ref 96–112)
CO2: 25 mEq/L (ref 19–32)
Calcium: 9.2 mg/dL (ref 8.4–10.5)
Creatinine, Ser: 0.98 mg/dL (ref 0.40–1.20)
GFR: 62.42 mL/min (ref >60.00)
GLUCOSE: 90 mg/dL (ref 70–99)
POTASSIUM: 3.6 meq/L (ref 3.5–5.1)
Sodium: 140 mEq/L (ref 135–145)

## 2015-07-28 LAB — T4, FREE: Free T4: 0.71 ng/dL (ref 0.60–1.60)

## 2015-07-28 NOTE — Progress Notes (Signed)
Quick Note:  Please let patient know that thyroid level is normal along with calcium of 9.2 and no change needed ______

## 2015-07-28 NOTE — Progress Notes (Signed)
Patient ID: Kaylee Gordon, female   DOB: April 07, 1961, 54 y.o.   MRN: [id number]   History of Present Illness:  Chief complaint: Followup of thyroid   Hypothyroidism:   She was tried on thyroxine supplements with a borderline TSH levels in 2011 However because of complaints of  fatigue on her visit in 10/15 she had thyroid levels done and free T4 was significantly low She was empirically started on 25 g  of levothyroxine Initially she felt less tired with this; however her free T4 continue to be low and her dose has been increased to 75 g since 1/16 when her free T4 was still low at 0.59  However because of a low TSH in 11/16 her PCP reduce the levothyroxine dose to 0.5 mg again  She thinks her energy level is still poor but also has had other intercurrent problems and anxiety, some hypersomnia She now is asking about cord intolerance and dry skin also  Her weight is stable  Wt Readings from Last 3 Encounters:  07/28/15 163 lb 9.6 oz (74.208 kg)  07/07/15 165 lb 6.4 oz (75.025 kg)  06/23/15 166 lb 3.2 oz (75.388 kg)    Lab Results  Component Value Date   FREET4 0.80 12/31/2014   FREET4 0.59* 07/22/2014   FREET4 0.53* 05/06/2014   TSH 2.64 07/22/2014   TSH 1.70 05/06/2014    Hypoparathyroidism    Has had hypocalcemia since she was 55 years old and presented with symptoms of cramping in her hands along with twitching of her face and eyes. She was diagnosed at [facility] as having primary idiopathic hypoparathyroidism.    No recent symptoms of  tingling in her face, muscle cramping or twitching of muscles. In 2015 she had been feeling much better with taking her calcium and calcitriol every day   She understands the need for consistent regimen and is compliant now Calcium level was 9.0 in 11/16 by PCP  She is taking calcitriol 0.25 mcg daily and also her calcium 1200 mg. twice a day   Lab Results  Component Value Date   CALCIUM 8.8 12/31/2014   CALCIUM 9.4  08/18/2014   CALCIUM 8.6 07/22/2014   CALCIUM 8.6 02/14/2013   CALCIUM 8.5 09/14/2011         Medication List       This list is accurate as of: 07/28/15  3:18 PM.  Always use your most recent med list.               calcitRIOL 0.25 MCG capsule  Commonly known as:  ROCALTROL  TAKE 1 CAPSULE BY MOUTH DAILY.     calcitRIOL 0.25 MCG capsule  Commonly known as:  ROCALTROL  TAKE 1 CAPSULE BY MOUTH DAILY.     celecoxib 200 MG capsule  Commonly known as:  CELEBREX  Take 200 mg by mouth 2 (two) times daily.     diazepam 10 MG tablet  Commonly known as:  VALIUM  TAKE 1 TABLET TWICE DAILY     dicyclomine 10 MG capsule  Commonly known as:  BENTYL  Take by mouth.     fluticasone 50 MCG/ACT nasal spray  Commonly known as:  FLONASE     levothyroxine 50 MCG tablet  Commonly known as:  SYNTHROID, LEVOTHROID  Take by mouth.     lisinopril 5 MG tablet  Commonly known as:  PRINIVIL,ZESTRIL  TAKE 1 TABLET BY MOUTH ONCE A DAY     lovastatin 20 MG tablet  Commonly known  as:  MEVACOR  Take 20 mg by mouth at bedtime.     omeprazole 40 MG capsule  Commonly known as:  PRILOSEC     PRISTIQ 50 MG 24 hr tablet  Generic drug:  desvenlafaxine  Take by mouth.     albuterol (5 MG/ML) 0.5% Nebu  Commonly known as:  PROVENTIL, VENTOLIN  Inhale into the lungs. Reported on 07/07/2015     PROVENTIL HFA 108 (90 Base) MCG/ACT inhaler  Generic drug:  albuterol     QUEtiapine 200 MG tablet  Commonly known as:  SEROQUEL  Take 1 tablet (200 mg total) by mouth at bedtime.     ZIANA gel  Generic drug:  clindamycin-tretinoin  APPLY TO AFFECTED AREA ON THE SKIN AT BEDTIME     ZOSTAVAX [address] UNT/0.65ML injection  Generic drug:  zoster vaccine live (PF)        Allergies:  Allergies  Allergen Reactions  . Parafon Forte Dsc [Chlorzoxazone]   . Pollen Extract   . Sudafed [Pseudoephedrine Hcl]     Past Medical History  Diagnosis Date  . Anxiety   . Depression   . Thyroid  disease   . CHF (congestive heart failure) ([place])   . Cancer [facility])     SKIN CANCER  . Hypertension   . Asthma     Past Surgical History  Procedure Laterality Date  . Appendectomy    . Breast enhancement surgery    . Skin cancer excision    . Nasal sinus surgery    . Hemorrhoid surgery    . Colonoscopy with propofol N/A 02/22/2015    Procedure: COLONOSCOPY WITH PROPOFOL;  Surgeon: Josefine Class, MD;  Location: [facility] ENDOSCOPY;  Service: Endoscopy;  Laterality: N/A;  . Esophagogastroduodenoscopy (egd) with propofol N/A 02/22/2015    Procedure: ESOPHAGOGASTRODUODENOSCOPY (EGD) WITH PROPOFOL;  Surgeon: Josefine Class, MD;  Location: [facility] ENDOSCOPY;  Service: Endoscopy;  Laterality: N/A;  . Savory dilation N/A 02/22/2015    Procedure: SAVORY DILATION;  Surgeon: Josefine Class, MD;  Location: [facility] ENDOSCOPY;  Service: Endoscopy;  Laterality: N/A;  . Ankle surgery Right 2012    Family History  Problem Relation Age of Onset  . Cancer Mother   . Depression Mother   . Anxiety disorder Mother   . Heart disease Father   . Cancer Father   . Anxiety disorder Father   . Depression Father   . Hypoparathyroidism Son     Social History:  reports that she has never smoked. She has never used smokeless tobacco. She reports that she drinks alcohol. She reports that she does not use illicit drugs.  Review of Systems -   She has a history of hypertension and is recently on  lisinopril Previously was having hypokalemia and HCTZ was stopped  Menopause: she had a normal FSH, since she had an endometrial ablation she does not know if he is having any cycles; no hot flashes Her gynecologist is Dr. Anders Simmonds  History of only mild osteopenia with T score -1.2 at the right hip  History of depression, is on Seroquel low dose and  Wellbutrin  HYPERCHOLESTEROLEMIA:  She thinks her baseline LDL was about 195. However had severe fatigue with Lipitor and recent LDL is below 100 with taking  low dose lovastatin   EXAM:  BP 126/76 mmHg  Pulse 114  Temp(Src) 97.8 F (36.6 C)  Resp 14  Ht 5\' 4"  (1.626 m)  Wt 163 lb 9.6 oz (74.208 kg)  BMI 28.07  kg/m2  SpO2 97%   Biceps reflexes appear normal  Assessment/Plan:   HYPOTHYROIDISM:  She has secondary hypothyroidism with persistently low free T4 and normal TSH.    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 also because of low [place] level in her menopause  Increase resting heart rate: Unlikely to be from thyroid hormone since free T4 level has not been high despite slightly low TSH in November, discussed that she should talk to her cardiologist again   HYPOPARATHYROIDISM, idiopathic:  Her calcium has been well controlled current regimen of 0.25 mcg calcitriol and calcium 1200 mg a day She has been very consistent with her medication and is asymptomatic now She will have labs checked today again for calcium levels   Robbi Scurlock 07/28/2015, 3:18 PM   Free T4 0.71, no change in dose, calcium 9.2

## 2015-07-29 ENCOUNTER — Ambulatory Visit: Payer: BC Managed Care – PPO | Admitting: Physical Therapy

## 2015-07-29 DIAGNOSIS — M629 Disorder of muscle, unspecified: Secondary | ICD-10-CM

## 2015-07-29 DIAGNOSIS — R2689 Other abnormalities of gait and mobility: Secondary | ICD-10-CM

## 2015-07-29 DIAGNOSIS — M6289 Other specified disorders of muscle: Secondary | ICD-10-CM

## 2015-07-29 DIAGNOSIS — R279 Unspecified lack of coordination: Secondary | ICD-10-CM

## 2015-07-29 NOTE — Addendum Note (Signed)
Addended by: Jerl Mina on: 07/29/2015 01:20 PM   Modules accepted: Orders

## 2015-07-29 NOTE — Patient Instructions (Signed)
  Pre-bed time routine Diaphragmatic breathing   Open book (handout)   Guided relaxation with pillow under knees, palms up and out  Body scan practice (emailed audio)

## 2015-07-29 NOTE — Therapy (Signed)
[place] MAIN [facility] SERVICES [address] [place], [place], [address] Phone: [phone]   Fax:  [phone]  Physical Therapy Treatment  Patient Details  Name: Kaylee Gordon MRN: [id number] Date of Birth: 1960/10/04 Referring Provider: Rayann Heman  Encounter Date: 07/29/2015      PT End of Session - 07/29/15 1523    Visit Number 2   Number of Visits 12   PT Start Time 1400   PT Stop Time 1500   PT Time Calculation (min) 60 min   Activity Tolerance Patient tolerated treatment well;No increased pain   Behavior During Therapy [facility] for tasks assessed/performed      Past Medical History  Diagnosis Date  . Anxiety   . Depression   . Thyroid disease   . CHF (congestive heart failure) ([place])   . Cancer [facility])     SKIN CANCER  . Hypertension   . Asthma     Past Surgical History  Procedure Laterality Date  . Appendectomy    . Breast enhancement surgery    . Skin cancer excision    . Nasal sinus surgery    . Hemorrhoid surgery    . Colonoscopy with propofol N/A 02/22/2015    Procedure: COLONOSCOPY WITH PROPOFOL;  Surgeon: Josefine Class, MD;  Location: [facility] ENDOSCOPY;  Service: Endoscopy;  Laterality: N/A;  . Esophagogastroduodenoscopy (egd) with propofol N/A 02/22/2015    Procedure: ESOPHAGOGASTRODUODENOSCOPY (EGD) WITH PROPOFOL;  Surgeon: Josefine Class, MD;  Location: [facility] ENDOSCOPY;  Service: Endoscopy;  Laterality: N/A;  . Savory dilation N/A 02/22/2015    Procedure: SAVORY DILATION;  Surgeon: Josefine Class, MD;  Location: [facility] ENDOSCOPY;  Service: Endoscopy;  Laterality: N/A;  . Ankle surgery Right 2012    There were no vitals filed for this visit.  Visit Diagnosis:  Tight fascia  Decreased mobility  Lack of coordination  Poor balance      Subjective Assessment - 07/29/15 1513    Subjective Pt reported she has made adjustments to her bra to help with diaphragmatic breathing. Pt states she has been massaging her  scars. Pt stated she hasa sinus headache today.   Pertinent History Hx of two childbirths, (C-sections). appendectomy, Pt feels she does not have a routine and feels she does not eat well. Pt has not had a regular routine for the past 2 yrs since foot surgery 2012. Hx of R ankle surgery (ORIF) due to a fall 2012. Pt enjoyed Zumba, jog-walk prior to surgery.    Patient Stated Goals 1) improve balance 2) exercise that she enjoys and destressing relaxation techniques.             [facility] PT Assessment - 07/29/15 1451    Coordination   Gross Motor Movements are Fluid and Coordinated --  improved diaphragmatic breathing post-Tx    Other:   Other/ Comments quadriped with lumbar lordosis, weak deep core   poor stability with UE over head from quadriped    Palpation   Spinal mobility increased mm tensions, hypomobility over thoracic spine (kyphotic), pre-Tx, decreased tensions/increased mobility along thoracic segments with report from pt:  "feeling better"                   Pelvic Floor Special Questions - 07/29/15 1450    Diastasis Recti 2 finger pre-Tx, 1.5 finger post-Tx below sternum           [facility] Adult PT Treatment/Exercise - 07/29/15 1418    Bed Mobility  Bed Mobility --  log rolling   Self-Care   Self-Care --  guided steps to relaxation in supine position   Neuro Re-ed    Neuro Re-ed Details  body scan technique 3 cycles, with explanation of benefits   cuing for smoother breathing pattern on exhale    Exercises   Exercises --  open book 10 reps each    Manual Therapy   Joint Mobilization Grade III PA mob T5-12   30 sec    Soft tissue mobilization paraspinals and intercostals (mid back area)    Myofascial Release scar massage over breast scars   abdominal pulling in quadriped to address DRA   Other Manual Therapy light manual therapy cranium/ sacrum to induce relaxation (noted relaxed head rotated left post-Tx,  more medially aligned head to spine post-Tx)                 PT Education - 07/29/15 1512    Education provided Yes   Education Details HEP   Person(s) Educated Patient   Methods Explanation;Demonstration;Tactile cues;Verbal cues;Handout   Comprehension Verbalized understanding;Returned demonstration             PT Long Term Goals - 07/29/15 1256    PT LONG TERM GOAL #1   Title Pt will demo decreased scar restriction over suprapubic area and below breasts in order to promote diaphragmatic breathing and pelvic floor movement.    Time 12   Period Weeks   Status New   PT LONG TERM GOAL #2   Title Pt will decrease her ZUNG anxiety scale from 70% to < 50% in order to better manage IBS Sx.    Time 12   Period Weeks   Status New   PT LONG TERM GOAL #3   Title Pt will decrease her COREFO score from 34% to < 28% in order to show improved colorectal function.    Time 12   Period Weeks   Status New   PT LONG TERM GOAL #4   Title Pt will demo decreased DRA from 2 fingers to 1.5 fingers below sternum in order to improve intraabdominal pressure for digestive and cores tability functions.    Time 12   Period Weeks   PT LONG TERM GOAL #5   Title Pt will demo log rolling technique across 2 visits to decrease straining of pelvic floor and abdominal muscles with bed mobility.    Time 12   Period Weeks   Status New               Plan - 07/29/15 1523    Clinical Impression Statement Pt reported feeling "relaxed and wanting to go to sleep" after manual Tx and relaxation training today. Pt showed decreased abdominal separation and improved diaphragmatic excursion post-Tx. Pt will continue to benefit from a biopsychosocial approach to her POC due to her anxiety and stress. Plan to address pelvic floor at next session.    Pt will benefit from skilled therapeutic intervention in order to improve on the following deficits Abnormal gait;Increased muscle spasms;Decreased strength;Improper body mechanics;Postural  dysfunction;Difficulty walking;Decreased activity tolerance;Decreased mobility;Decreased balance;Decreased endurance;Decreased scar mobility;Increased fascial restricitons;Decreased coordination;Decreased cognition;Hypomobility;Pain;Decreased range of motion  relaxation training   Rehab Potential Good   PT Frequency 2x / week   PT Duration 12 weeks   PT Treatment/Interventions ADLs/Self Care Home Management;Cryotherapy;Aquatic Therapy;Moist Heat;Traction;Electrical Stimulation;Patient/family education;Neuromuscular re-education;Balance training;Therapeutic exercise;Therapeutic activities;Functional mobility training;Stair training;Gait training;Manual techniques;Scar mobilization;Taping;Splinting;Passive range of motion;Other (comment)  relaxation training   Consulted and Agree with  Plan of Care Patient        Problem List Patient Active Problem List   Diagnosis Date Noted  . Benign essential HTN 06/22/2015  . Combined fat and carbohydrate induced hyperlipemia 06/22/2015  . Breathlessness on exertion 06/17/2015  . Pure hypercholesterolemia 04/22/2015  . Congestive heart failure with left ventricular systolic dysfunction ([place]) 03/24/2015  . Acquired hypothyroidism 02/04/2015  . Heart failure, systolic ([place]) [id number]  . Anxiety 12/28/2014  . Allergic rhinitis 10/27/2014  . Bipolar 1 disorder, depressed ([place]) 10/27/2014  . Hypercholesterolemia without hypertriglyceridemia 10/27/2014  . Depression, major, recurrent, moderate ([place]) 10/27/2014  . Adult hypothyroidism 10/27/2014  . Anxiety, generalized 10/27/2014  . Essential (primary) hypertension 10/27/2014  . Colon polyp 10/27/2014  . Hypoparathyroidism ([place]) 02/14/2013    Jerl Mina  ,PT, DPT, E-RYT  07/29/2015, 3:25 PM  [place] MAIN [facility] SERVICES [address] [place], [place], [address] Phone: [phone]   Fax:  [phone]  Name: ALEIGHYA FENERTY MRN: [id number] Date of  Birth: 1961-01-21

## 2015-07-31 ENCOUNTER — Other Ambulatory Visit: Payer: Self-pay | Admitting: Psychiatry

## 2015-08-02 NOTE — Telephone Encounter (Signed)
rx faxed and confirmed. - valum 10mg  id [id number] order # [id number]

## 2015-08-03 ENCOUNTER — Ambulatory Visit: Payer: BC Managed Care – PPO | Admitting: Physical Therapy

## 2015-08-03 ENCOUNTER — Other Ambulatory Visit: Payer: Self-pay | Admitting: Psychiatry

## 2015-08-05 ENCOUNTER — Ambulatory Visit: Payer: BC Managed Care – PPO | Admitting: Psychiatry

## 2015-08-05 ENCOUNTER — Ambulatory Visit: Payer: BC Managed Care – PPO | Admitting: Physical Therapy

## 2015-08-10 ENCOUNTER — Encounter: Payer: Self-pay | Admitting: Psychiatry

## 2015-08-10 ENCOUNTER — Ambulatory Visit (INDEPENDENT_AMBULATORY_CARE_PROVIDER_SITE_OTHER): Payer: BC Managed Care – PPO | Admitting: Psychiatry

## 2015-08-10 ENCOUNTER — Ambulatory Visit: Payer: BC Managed Care – PPO | Admitting: Physical Therapy

## 2015-08-10 VITALS — BP 120/82 | HR 109 | Temp 98.8°F | Ht 64.0 in | Wt 162.6 lb

## 2015-08-10 DIAGNOSIS — F331 Major depressive disorder, recurrent, moderate: Secondary | ICD-10-CM

## 2015-08-10 DIAGNOSIS — R279 Unspecified lack of coordination: Secondary | ICD-10-CM

## 2015-08-10 DIAGNOSIS — F411 Generalized anxiety disorder: Secondary | ICD-10-CM | POA: Diagnosis not present

## 2015-08-10 DIAGNOSIS — M6289 Other specified disorders of muscle: Secondary | ICD-10-CM

## 2015-08-10 DIAGNOSIS — M629 Disorder of muscle, unspecified: Secondary | ICD-10-CM | POA: Diagnosis not present

## 2015-08-10 DIAGNOSIS — R2689 Other abnormalities of gait and mobility: Secondary | ICD-10-CM

## 2015-08-10 MED ORDER — DIAZEPAM 10 MG PO TABS: 10.0000 mg | ORAL_TABLET | Freq: Two times a day (BID) | ORAL | Status: DC

## 2015-08-10 MED ORDER — QUETIAPINE FUMARATE 200 MG PO TABS: 200.0000 mg | ORAL_TABLET | Freq: Every day | ORAL | Status: DC

## 2015-08-10 MED ORDER — DESVENLAFAXINE SUCCINATE ER 50 MG PO TB24
50.0000 mg | ORAL_TABLET | Freq: Every day | ORAL | Status: DC
Start: 2015-08-10 — End: 2015-09-21

## 2015-08-10 NOTE — Therapy (Addendum)
[place] MAIN [facility] SERVICES [address] [place], [place], [address] Phone: [phone]   Fax:  [phone]  Physical Therapy Treatment  Patient Details  Name: Kaylee Gordon MRN: [id number] Date of Birth: 08/21/1960 Referring Provider: Rayann Heman  Encounter Date: 08/10/2015      PT End of Session - 08/10/15 2254    Visit Number 3   Number of Visits 12   PT Start Time 1310   PT Stop Time 1410   PT Time Calculation (min) 60 min   Activity Tolerance Patient tolerated treatment well;No increased pain   Behavior During Therapy [facility] for tasks assessed/performed      Past Medical History  Diagnosis Date  . Anxiety   . Depression   . Thyroid disease   . CHF (congestive heart failure) ([place])   . Cancer [facility])     SKIN CANCER  . Hypertension   . Asthma     Past Surgical History  Procedure Laterality Date  . Appendectomy    . Breast enhancement surgery    . Skin cancer excision    . Nasal sinus surgery    . Hemorrhoid surgery    . Colonoscopy with propofol N/A 02/22/2015    Procedure: COLONOSCOPY WITH PROPOFOL;  Surgeon: Josefine Class, MD;  Location: [facility] ENDOSCOPY;  Service: Endoscopy;  Laterality: N/A;  . Esophagogastroduodenoscopy (egd) with propofol N/A 02/22/2015    Procedure: ESOPHAGOGASTRODUODENOSCOPY (EGD) WITH PROPOFOL;  Surgeon: Josefine Class, MD;  Location: [facility] ENDOSCOPY;  Service: Endoscopy;  Laterality: N/A;  . Savory dilation N/A 02/22/2015    Procedure: SAVORY DILATION;  Surgeon: Josefine Class, MD;  Location: [facility] ENDOSCOPY;  Service: Endoscopy;  Laterality: N/A;  . Ankle surgery Right 2012    There were no vitals filed for this visit.  Visit Diagnosis:  Tight fascia  Decreased mobility  Lack of coordination  Poor balance      Subjective Assessment - 08/10/15 1314    Subjective Pt reported she felt "excellent, better than I had ever felt in, I don't know how long".   Pt experienced the loss of her  friend and some ofter dissapointng news this week. Pt states her diarrhea came with the onset of the bad news and emotional sadness.  Pt felt nauseous last night and vomitted last night but feels better now. Pt denied fever, chills. Pt states " for the most part, her abdominal pain has been okay" as she feels good that she was able to manage throughout emotional distress this week.        Pertinent History Hx of two childbirths, (C-sections). appendectomy, Pt feels she does not have a routine and feels she does not eat well. Pt has not had a regular routine for the past 2 yrs since foot surgery 2012. Hx of R ankle surgery (ORIF) due to a fall 2012. Pt enjoyed Zumba, jog-walk prior to surgery.    Patient Stated Goals 1) improve balance 2) exercise that she enjoys and destressing relaxation techniques.             [facility] PT Assessment - 08/10/15 2242    Observation/Other Assessments   Observations pt wears high heels to each visit. pt reports she is unable to wear flat shoes due to plantar fasciitis and she enjoys having a high heel colleciton   Coordination   Gross Motor Movements are Fluid and Coordinated --  tendency to arch @ T/L junction in hooklying, sitting, stand   Fine Motor Movements  are Fluid and Coordinated --  very slight pelvic floor ROM w/ improved ribcage mobility   Palpation   Palpation comment Decreased scar mobility on R  > L    Bed Mobility   Bed Mobility --  required log rolling cues today                  Pelvic Floor Special Questions - 08/10/15 2242    Diastasis Recti Half a finger width    Prolapse None   Pelvic Floor Internal Exam pt consented verbally without contraindications   Exam Type Vaginal   Palpation tenderness and tensions on R pubococcygeus (posterior) and L ischiococcgyeus  (pt reports her husband has told her before that her pelvic floor felt tight)    Strength -- deferred to measure until mm tensions are absent    Biofeedback cued for  smoother exhalation, depression of ribcage            [facility] Adult PT Treatment/Exercise - 08/10/15 2242    Self-Care   Other Self-Care Comments  biopsychosocial approaches to stress, complimented pt on her ability to self-manage during stressful week without signficant relapse of Sx   Neuro Re-ed    Neuro Re-ed Details  ribcage depression, seated/ hooklying diaphragmatic breathing w/ less chesting breathing, less T/L arching   seated diaphragmatic breathing AAROM w/ sheet   Manual Therapy   Myofascial Release scar massage  over C-Section scar R end   abdominal massage    Internal Pelvic Floor thiele massage and tactile cuing for pelvic floor ROM    Other Manual Therapy facilitated ribcage depression                 PT Education - 08/10/15 2254    Education provided Yes   Education Details HEP   Methods Explanation;Demonstration;Tactile cues;Verbal cues;Handout   Comprehension Verbalized understanding;Returned demonstration             PT Long Term Goals - 07/29/15 1256    PT LONG TERM GOAL #1   Title Pt will demo decreased scar restriction over suprapubic area and below breasts in order to promote diaphragmatic breathing and pelvic floor movement.    Time 12   Period Weeks   Status New   PT LONG TERM GOAL #2   Title Pt will decrease her ZUNG anxiety scale from 70% to < 50% in order to better manage IBS Sx.    Time 12   Period Weeks   Status New   PT LONG TERM GOAL #3   Title Pt will decrease her COREFO score from 34% to < 28% in order to show improved colorectal function.    Time 12   Period Weeks   Status New   PT LONG TERM GOAL #4   Title Pt will demo decreased DRA from 2 fingers to 1.5 fingers below sternum in order to improve intraabdominal pressure for digestive and cores tability functions.    Time 12   Period Weeks   PT LONG TERM GOAL #5   Title Pt will demo log rolling technique across 2 visits to decrease straining of pelvic floor and abdominal  muscles with bed mobility.    Time 12   Period Weeks   Status New               Plan - 08/10/15 2255    Clinical Impression Statement Pt is progressing well with compliance to mindfulness technique (body scan) and improved ability to cope with increased emotional  stress this week. Diarrhea continues to a symptomatic response to her stress. While on the other hand, her abdominal pain seems to be improving as she reported " for the most part, her abdominal pain has been okay".  This improvement may be a consequence to the positive musculoskeletal changes she has shown in response to last visit's treatments. Her abdominal separation (Diastasis recti)  was addressed at last session and has remained 1/2 fingers width from 2 fingers width across the past two visits. Pt is also showing increased ability to perform diaphragmatic breathing and less chest breathing.  Today, pt showed decreased pelvic floor mm tensions (R pubococcygeus), and is working to improve coordination movement of pelvic floor ROM.  The overall biomechanical goal is to coordinate the whole integrated deep core system (Diaphragm, deep TrA, and pelvic floor ) for increased intrabdominal pressure which will positively impact her bowels and abdominal Sx. Pt's remaining barriers to achieve this coordinated function include:  1)  decreased fascial pliability over the abdominopelvic area 2/2 scar restrictions near her ribcage and suprapubic area, 2) her postural tendencies to stand with increased lumbar lordosis (ribcage anterior to pelvis) 2/2 wearing high heels on a regular basis, 3)  upper trap overactivity 2/2 anxiety.  Pt continues to benefit from skilled PT to address these biomechanical deficits and to progress further towards her LTG for improved function and QOL.     Pt will benefit from skilled therapeutic intervention in order to improve on the following deficits Abnormal gait;Increased muscle spasms;Decreased strength;Improper body  mechanics;Postural dysfunction;Difficulty walking;Decreased activity tolerance;Decreased mobility;Decreased balance;Decreased endurance;Decreased scar mobility;Increased fascial restricitons;Decreased coordination;Decreased cognition;Hypomobility;Pain;Decreased range of motion  relaxation training   Rehab Potential Good   PT Frequency 2x / week   PT Duration 12 weeks   PT Treatment/Interventions ADLs/Self Care Home Management;Cryotherapy;Aquatic Therapy;Moist Heat;Traction;Electrical Stimulation;Patient/family education;Neuromuscular re-education;Balance training;Therapeutic exercise;Therapeutic activities;Functional mobility training;Stair training;Gait training;Manual techniques;Scar mobilization;Taping;Splinting;Passive range of motion;Other (comment)  relaxation training   Consulted and Agree with Plan of Care Patient        Problem List Patient Active Problem List   Diagnosis Date Noted  . Benign essential HTN 06/22/2015  . Combined fat and carbohydrate induced hyperlipemia 06/22/2015  . Breathlessness on exertion 06/17/2015  . Pure hypercholesterolemia 04/22/2015  . Congestive heart failure with left ventricular systolic dysfunction ([place]) 03/24/2015  . Acquired hypothyroidism 02/04/2015  . Heart failure, systolic ([place]) 123456  . Anxiety 12/28/2014  . Allergic rhinitis 10/27/2014  . Bipolar 1 disorder, depressed ([place]) 10/27/2014  . Hypercholesterolemia without hypertriglyceridemia 10/27/2014  . Depression, major, recurrent, moderate ([place]) 10/27/2014  . Adult hypothyroidism 10/27/2014  . Anxiety, generalized 10/27/2014  . Essential (primary) hypertension 10/27/2014  . Colon polyp 10/27/2014  . Hypoparathyroidism ([place]) 02/14/2013    Jerl Mina 08/10/2015, 11:56 PM  [place] MAIN [facility] SERVICES [address] [place], [place], [address] Phone: [phone]   Fax:  [phone]  Name: Kaylee Gordon MRN: [id number] Date of  Birth: 02/29/1961

## 2015-08-10 NOTE — Patient Instructions (Signed)
AAROM with towel/sheet for seated diaphragmatic breathing (Handout)

## 2015-08-10 NOTE — Progress Notes (Signed)
[place] MD/PA/NP OP Progress Note  08/10/2015 3:13 PM Kaylee Gordon  MRN:  [id number]  Subjective:  Patient returns for follow-up of anxiety and major depression. They the major anxiety provoking issue continues to be thoughts about her work environment and more recently worry about her daughter accepted college. She states her daughter's been accepted to several colleges but not her choice which Is to be patient's alma mater. Patient states it is now rekindled thoughts about the fact that she does not even like her college because she was forced to go there and now it is where her daughter wants to go.  Also states she had anxiety and stress during a funeral for a colleague who was also a friend. 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 She stated she does not believe she can return to that environment. She still has difficulty concentrating and focusing and an example is that her kids will say something to her and then she has to ask them what he just said.  States she did start to go to see a station therapist who is taking her through exercises to relax her body. She states she has not made contact yet with the LCSW therapist that I recommended she try to see.   Chief Complaint:  Chief Complaint    Follow-up; Medication Refill; Panic Attack; Stress; Depression     Visit Diagnosis:   No diagnosis found.  Past Medical History:  Past Medical History  Diagnosis Date  . Anxiety   . Depression   . Thyroid disease   . CHF (congestive heart failure) ([place])   . Cancer [facility])     SKIN CANCER  . Hypertension   . Asthma     Past Surgical History  Procedure Laterality Date  . Appendectomy    . Breast enhancement surgery    . Skin cancer excision    . Nasal sinus surgery    . Hemorrhoid surgery    . Colonoscopy with propofol N/A 02/22/2015    Procedure: COLONOSCOPY WITH PROPOFOL;  Surgeon: Josefine Class, MD;  Location: [facility] ENDOSCOPY;  Service: Endoscopy;  Laterality: N/A;  . Esophagogastroduodenoscopy (egd) with propofol N/A 02/22/2015    Procedure: ESOPHAGOGASTRODUODENOSCOPY (EGD) WITH PROPOFOL;  Surgeon: Josefine Class, MD;  Location: [facility] ENDOSCOPY;  Service: Endoscopy;  Laterality: N/A;  . Savory dilation N/A 02/22/2015    Procedure: SAVORY DILATION;  Surgeon: Josefine Class, MD;  Location: [facility] ENDOSCOPY;  Service: Endoscopy;  Laterality: N/A;  . Ankle surgery Right 2012   Family History:  Family History  Problem Relation Age of Onset  . Cancer Mother   . Depression Mother   . Anxiety disorder Mother   . Heart disease Father   . Cancer Father   . Anxiety disorder Father   . Depression Father   . Hypoparathyroidism Son    Social History:  Social History   Social History  . Marital Status: Married    Spouse Name: N/A  . Number of Children: N/A  . Years of Education: N/A   Social History Main Topics  . Smoking status: Never Smoker   . Smokeless tobacco: Never Used  . Alcohol Use: 0.0 oz/week    0 Standard drinks or equivalent per week     Comment: MAYBE ONCE OR TWICE A MONTH  . Drug Use: No  . Sexual Activity: No   Other Topics Concern  . None  Social History Narrative   Additional History:   Assessment:  Patient thanked Probation officer for trying to help her and gave Probation officer an unsolicited hug. Musculoskeletal: Strength & Muscle Tone: within normal limits Gait & Station: normal Patient leans: N/A  Psychiatric Specialty Exam: Depression        Associated symptoms include does not have insomnia and no suicidal ideas.  Past medical history includes anxiety.   Anxiety Symptoms include nervous/anxious behavior (surrounding work). Patient reports no insomnia or suicidal ideas.      Review of Systems  Psychiatric/Behavioral: Positive for depression. Negative for suicidal ideas, hallucinations, memory loss and substance abuse. The patient is nervous/anxious  (surrounding work). The patient does not have insomnia.   All other systems reviewed and are negative.   Blood pressure 120/82, pulse 109, temperature 98.8 F (37.1 C), temperature source Tympanic, height 5\' 4"  (1.626 m), weight 162 lb 9.6 oz (73.755 kg), SpO2 96 %.Body mass index is 27.9 kg/(m^2).  General Appearance: Neat and Well Groomed  Eye Contact:  Good  Speech:  Clear and Coherent and Normal Rate  Volume:  Normal  Mood:  good  Affect:  Anxious   Thought Process:  Circumstantial  Orientation:  Full (Time, Place, and Person)  Thought Content:  Negative  Suicidal Thoughts:  No  Homicidal Thoughts:  No  Memory:  Immediate;   Good Recent;   Good Remote;   Good  Judgement:  Good  Insight:  Good  Psychomotor Activity:  Negative and Normal  Concentration:  Good  Recall:  Good  Fund of Knowledge: Good  Language: Good  Akathisia:  Negative  Handed:  Right  AIMS (if indicated):  Not done  Assets:  Communication Skills Desire for Improvement Vocational/Educational  ADL's:  Intact  Cognition: WNL  Sleep:  Sleeping more on weekends    Is the patient at risk to self?  No. Has the patient been a risk to self in the past 6 months?  No. Has the patient been a risk to self within the distant past?  No. Is the patient a risk to others?  No. Has the patient been a risk to others in the past 6 months?  No. Has the patient been a risk to others within the distant past?  No.  Current Medications: Current Outpatient Prescriptions  Medication Sig Dispense Refill  . albuterol (PROVENTIL, VENTOLIN) (5 MG/ML) 0.5% NEBU Inhale into the lungs. Reported on 07/07/2015    . calcitRIOL (ROCALTROL) 0.25 MCG capsule TAKE 1 CAPSULE BY MOUTH DAILY. 30 capsule 0  . celecoxib (CELEBREX) 200 MG capsule Take 200 mg by mouth 2 (two) times daily.    Marland Kitchen desvenlafaxine (PRISTIQ) 50 MG 24 hr tablet Take 1 tablet (50 mg total) by mouth daily. 30 tablet 4  . diazepam (VALIUM) 10 MG tablet Take 1 tablet (10 mg  total) by mouth 2 (two) times daily. 60 tablet 4  . dicyclomine (BENTYL) 10 MG capsule Take by mouth.    . fluticasone (FLONASE) 50 MCG/ACT nasal spray     . levothyroxine (SYNTHROID, LEVOTHROID) 50 MCG tablet Take by mouth.    Marland Kitchen lisinopril (PRINIVIL,ZESTRIL) 5 MG tablet TAKE 1 TABLET BY MOUTH ONCE A DAY    . lovastatin (MEVACOR) 20 MG tablet Take 20 mg by mouth at bedtime.    Marland Kitchen omeprazole (PRILOSEC) 40 MG capsule     . PRISTIQ 50 MG 24 hr tablet TAKE 1 TABLET (50 MG TOTAL) BY MOUTH EVERY MORNING. 30 tablet 4  . PROVENTIL  HFA 108 (90 BASE) MCG/ACT inhaler     . QUEtiapine (SEROQUEL) 200 MG tablet Take 1 tablet (200 mg total) by mouth at bedtime. 30 tablet 4  . ZIANA gel APPLY TO AFFECTED AREA ON THE SKIN AT BEDTIME  3  . ZOSTAVAX 91478 UNT/0.65ML injection      No current facility-administered medications for this visit.    Medical Decision Making:  Established Problem, Stable/Improving (1)  Treatment Plan Summary:Medication management Major depressive disorder- Continue Pristiq 50 mg in the morning. She feels the Pristiq has helped her mood. She will continue her Seroquel 200 mg at bedtime.    Generalized anxiety disorder-this still continues to be present in multiple arenas with the exception of work at this time because she is not going to work. Continues to be an issue and is evident by visible anxiety and near tearfulness when discussing social and occupational factors that lead to her anxiety.  She'll follow-up in 2 weeks. He is aware that will be her last appointment with this writer and that she'll transition to another doctor in this practice. She's been encouraged call with questions concerns prior to her next appointment.  Faith Rogue 08/10/2015, 3:13 PM

## 2015-08-10 NOTE — Therapy (Addendum)
[place] MAIN [facility] SERVICES [address] [place], [place], [address] Phone: [phone]   Fax:  [phone]  Physical Therapy Treatment  Patient Details  Name: Kaylee Gordon MRN: [id number] Date of Birth: 08/08/1960 Referring Provider: Rayann Heman  Encounter Date: 08/10/2015      PT End of Session - 08/10/15 2254    Visit Number 3   Number of Visits 12   PT Start Time 1310   PT Stop Time 1410   PT Time Calculation (min) 60 min   Activity Tolerance Patient tolerated treatment well;No increased pain   Behavior During Therapy [facility] for tasks assessed/performed      Past Medical History  Diagnosis Date  . Anxiety   . Depression   . Thyroid disease   . CHF (congestive heart failure) ([place])   . Cancer [facility])     SKIN CANCER  . Hypertension   . Asthma     Past Surgical History  Procedure Laterality Date  . Appendectomy    . Breast enhancement surgery    . Skin cancer excision    . Nasal sinus surgery    . Hemorrhoid surgery    . Colonoscopy with propofol N/A 02/22/2015    Procedure: COLONOSCOPY WITH PROPOFOL;  Surgeon: Josefine Class, MD;  Location: [facility] ENDOSCOPY;  Service: Endoscopy;  Laterality: N/A;  . Esophagogastroduodenoscopy (egd) with propofol N/A 02/22/2015    Procedure: ESOPHAGOGASTRODUODENOSCOPY (EGD) WITH PROPOFOL;  Surgeon: Josefine Class, MD;  Location: [facility] ENDOSCOPY;  Service: Endoscopy;  Laterality: N/A;  . Savory dilation N/A 02/22/2015    Procedure: SAVORY DILATION;  Surgeon: Josefine Class, MD;  Location: [facility] ENDOSCOPY;  Service: Endoscopy;  Laterality: N/A;  . Ankle surgery Right 2012    There were no vitals filed for this visit.  Visit Diagnosis:  No diagnosis found.      Subjective Assessment - 08/10/15 1314    Subjective Pt felt "excellent, better than I had felt in, I don't how long".   Pt experienced the loss of her friend and some ofter dissapointng news. Pt's diarrhea came with the onset of  the bad news and emotional sadness.  Pt felt nauseous last night and vomitted last night and feels better now. Pt denied fever. Pt states " for the most part, her abdominal pain has been okay" as she feels good that she was able to manage throughout emotional distress this week. During pelvic floor assessment, pt reported her husband tells her that her pelvic floor mm feels tight.       Pertinent History Hx of two childbirths, (C-sections). appendectomy, Pt feels she does not have a routine and feels she does not eat well. Pt has not had a regular routine for the past 2 yrs since foot surgery 2012. Hx of R ankle surgery (ORIF) due to a fall 2012. Pt enjoyed Zumba, jog-walk prior to surgery.    Patient Stated Goals 1) improve balance 2) exercise that she enjoys and destressing relaxation techniques.             [facility] PT Assessment - 08/10/15 2242    Observation/Other Assessments   Observations pt wears high heels to each visit. pt reports she is unable to wear flat shoes due to plantar fasciitis and she enjoys having a high heel colleciton   Coordination   Gross Motor Movements are Fluid and Coordinated --  tendency to arch @ T/L junction in hooklying, sitting, stand   Fine Motor Movements are Fluid  and Coordinated --  very slight pelvic floor ROM w/ improved ribcage mobility   Palpation   Palpation comment Decreased scar mobility on R  > L    Bed Mobility   Bed Mobility --  required log rolling cues today                  Pelvic Floor Special Questions - 08/10/15 2242    Diastasis Recti .5 fingers width    Prolapse None   Pelvic Floor Internal Exam pt consented verbally without contraindications   Exam Type Vaginal   Palpation tenderness and tensions on R pubococcygeus (posterior) and L ischiococcgyeus    Strength --  deferred to measure until mm tensions are absent    Biofeedback cued for smoother exhalation, depression of ribcage            [facility] Adult PT  Treatment/Exercise - 08/10/15 2242    Self-Care   Other Self-Care Comments  biopsychosocial approaches to stress, complimented pt on her ability to self-manage during stressful week without signficant relapse of Sx   Neuro Re-ed    Neuro Re-ed Details  ribcage depression, seated/ hooklying diaphragmatic breathing w/ less chesting breathing, less T/L arching   seated diaphragmatic breathing AAROM w/ sheet   Manual Therapy   Myofascial Release scar massage  over C-Section scar R end   abdominal massage    Internal Pelvic Floor thiele massage and tactile cuing for pelvic floor ROM    Other Manual Therapy facilitated ribcage depression                 PT Education - 08/10/15 2254    Education provided Yes   Education Details HEP   Methods Explanation;Demonstration;Tactile cues;Verbal cues;Handout   Comprehension Verbalized understanding;Returned demonstration             PT Long Term Goals - 07/29/15 1256    PT LONG TERM GOAL #1   Title Pt will demo decreased scar restriction over suprapubic area and below breasts in order to promote diaphragmatic breathing and pelvic floor movement.    Time 12   Period Weeks   Status New   PT LONG TERM GOAL #2   Title Pt will decrease her ZUNG anxiety scale from 70% to < 50% in order to better manage IBS Sx.    Time 12   Period Weeks   Status New   PT LONG TERM GOAL #3   Title Pt will decrease her COREFO score from 34% to < 28% in order to show improved colorectal function.    Time 12   Period Weeks   Status New   PT LONG TERM GOAL #4   Title Pt will demo decreased DRA from 2 fingers to 1.5 fingers below sternum in order to improve intraabdominal pressure for digestive and cores tability functions.    Time 12   Period Weeks   PT LONG TERM GOAL #5   Title Pt will demo log rolling technique across 2 visits to decrease straining of pelvic floor and abdominal muscles with bed mobility.    Time 12   Period Weeks   Status New                Plan - 08/10/15 2255    Clinical Impression Statement Pt is progressing well with compliance to mindfulness technique (body scan) and improved ability to cope with increased emotional stress this week. Diarrhea continues to a symptomatic response to her stress. While on the otherhand, her  abdominal pain seems to be improving as she reported " for the most part, her abdominal pain has been okay".  This improvement may be a consequence to the positive musculoskeletal changes she has shown in response to last visit's treatments. She showed decreased abdominal separation ( Diastasis Recti) from 2 fingers width to half a fingers width. Pt is also showing increased ability to perform diaphragmatic breathing and lless chest breathing.  Today, pt showed decreased pelvic floor mm tensions (R pubococcygeus), and is working to improve coordination movement of pelvic floor ROM.  The overall biomechanical goal is to coordinate the whole integrated deep core system (Diaphragm, deep TrA, and pelvic floor ) for increased intrabdominal pressure which will positively impact her bowels and abdominal Sx. Pt's remaining barriers to achieve this coordinated function are decreased fascial pliability over the abdominopelvic area 2/2 scar restrictions near her ribcage and suprapubic area, her postural tendencies to stand with increased lumbar lordosis (ribcage anterior to pelvis) 2/2 wearing high heels on a regular basis and upper trap overactivity 2/2 anxiety.  Pt continues to benefit from skilled PT to address these biomechanical deficits and to progress further towards her LTG.  Pt is progressing well with compliance to mindfulness technique (body scan) and improved ability to cope with increased emotional stress this week. Diarrhea continues to a symptomatic response to her stress. While on the otherhand, her abdominal pain seems to be improving as she reported " for the most part, her abdominal pain has been  okay".  This improvement may be a consequence to the positive musculoskeletal changes she has shown in response to last visit's treatments. She showed decreased abdominal separation ( Diastasis Recti) from 2 fingers width to half a fingers width. Pt is also showing increased ability to perform diaphragmatic breathing and lless chest breathing.  Today, pt showed decreased pelvic floor mm tensions (R pubococcygeus), and is working to improve coordination movement of pelvic floor ROM.  The overall biomechanical goal is to coordinate the whole integrated deep core system (Diaphragm, deep TrA, and pelvic floor ) for increased intrabdominal pressure which will positively impact her bowels and abdominal Sx. Pt's remaining barriers to achieve this coordinated function are decreased fascial pliability over the abdominopelvic area 2/2 scar restrictions near her ribcage and suprapubic area, her postural tendencies to stand with increased lumbar lordosis (ribcage anterior to pelvis) 2/2 wearing high heels on a regular basis and upper trap overactivity 2/2 anxiety.  Pt continues to benefit from skilled PT to address these biomechanical deficits and to progress further towards her LTG.     Pt will benefit from skilled therapeutic intervention in order to improve on the following deficits Abnormal gait;Increased muscle spasms;Decreased strength;Improper body mechanics;Postural dysfunction;Difficulty walking;Decreased activity tolerance;Decreased mobility;Decreased balance;Decreased endurance;Decreased scar mobility;Increased fascial restricitons;Decreased coordination;Decreased cognition;Hypomobility;Pain;Decreased range of motion  relaxation training   Rehab Potential Good   PT Frequency 2x / week   PT Duration 12 weeks   PT Treatment/Interventions ADLs/Self Care Home Management;Cryotherapy;Aquatic Therapy;Moist Heat;Traction;Electrical Stimulation;Patient/family education;Neuromuscular re-education;Balance  training;Therapeutic exercise;Therapeutic activities;Functional mobility training;Stair training;Gait training;Manual techniques;Scar mobilization;Taping;Splinting;Passive range of motion;Other (comment)  relaxation training   Consulted and Agree with Plan of Care Patient        Problem List Patient Active Problem List   Diagnosis Date Noted  . Benign essential HTN 06/22/2015  . Combined fat and carbohydrate induced hyperlipemia 06/22/2015  . Breathlessness on exertion 06/17/2015  . Pure hypercholesterolemia 04/22/2015  . Congestive heart failure with left ventricular systolic dysfunction ([place]) 03/24/2015  . Acquired  hypothyroidism 02/04/2015  . Heart failure, systolic ([place]) 123456  . Anxiety 12/28/2014  . Allergic rhinitis 10/27/2014  . Bipolar 1 disorder, depressed ([place]) 10/27/2014  . Hypercholesterolemia without hypertriglyceridemia 10/27/2014  . Depression, major, recurrent, moderate ([place]) 10/27/2014  . Adult hypothyroidism 10/27/2014  . Anxiety, generalized 10/27/2014  . Essential (primary) hypertension 10/27/2014  . Colon polyp 10/27/2014  . Hypoparathyroidism ([place]) 02/14/2013    Jerl Mina ,PT, DPT, E-RYT  08/10/2015, 11:12 PM  [place] MAIN [facility] SERVICES [address] [place], [place], [address] Phone: [phone]   Fax:  [phone]  Name: Kaylee Gordon MRN: [id number] Date of Birth: 1960-11-13

## 2015-08-12 ENCOUNTER — Ambulatory Visit: Payer: BC Managed Care – PPO | Admitting: Physical Therapy

## 2015-08-12 DIAGNOSIS — R2689 Other abnormalities of gait and mobility: Secondary | ICD-10-CM

## 2015-08-12 DIAGNOSIS — M629 Disorder of muscle, unspecified: Secondary | ICD-10-CM | POA: Diagnosis not present

## 2015-08-12 DIAGNOSIS — R279 Unspecified lack of coordination: Secondary | ICD-10-CM

## 2015-08-12 DIAGNOSIS — M6289 Other specified disorders of muscle: Secondary | ICD-10-CM

## 2015-08-12 NOTE — Therapy (Addendum)
[place] MAIN [facility] SERVICES [address] [place], [place], [address] Phone: [phone]   Fax:  [phone]  Physical Therapy Treatment  Patient Details  Name: Kaylee Gordon MRN: [id number] Date of Birth: 1961-01-17 Referring Provider: Rayann Heman  Encounter Date: 08/12/2015      PT End of Session - 08/12/15 2239    Visit Number 4   Number of Visits 12   PT Start Time 1300   PT Stop Time 1400   PT Time Calculation (min) 60 min   Activity Tolerance Patient tolerated treatment well;No increased pain   Behavior During Therapy [place] for tasks assessed/performed      Past Medical History  Diagnosis Date  . Anxiety   . Depression   . Thyroid disease   . CHF (congestive heart failure) ([place])   . Cancer [facility])     SKIN CANCER  . Hypertension   . Asthma     Past Surgical History  Procedure Laterality Date  . Appendectomy    . Breast enhancement surgery    . Skin cancer excision    . Nasal sinus surgery    . Hemorrhoid surgery    . Colonoscopy with propofol N/A 02/22/2015    Procedure: COLONOSCOPY WITH PROPOFOL;  Surgeon: Josefine Class, MD;  Location: [facility] ENDOSCOPY;  Service: Endoscopy;  Laterality: N/A;  . Esophagogastroduodenoscopy (egd) with propofol N/A 02/22/2015    Procedure: ESOPHAGOGASTRODUODENOSCOPY (EGD) WITH PROPOFOL;  Surgeon: Josefine Class, MD;  Location: [facility] ENDOSCOPY;  Service: Endoscopy;  Laterality: N/A;  . Savory dilation N/A 02/22/2015    Procedure: SAVORY DILATION;  Surgeon: Josefine Class, MD;  Location: [facility] ENDOSCOPY;  Service: Endoscopy;  Laterality: N/A;  . Ankle surgery Right 2012    There were no vitals filed for this visit.  Visit Diagnosis:  Tight fascia  Decreased mobility  Lack of coordination  Poor balance      Subjective Assessment - 08/12/15 1307    Subjective After last session, pt felt "so relaxed, she could fall asleep". Pt went to her psychiatrist's appt and the nurse  complimented her with the statement "you look so much better than at your last appt".  Pt reported she had "really good sleep"  the night after the session. Pt stated her diet consisted of coffee and Poptart for breakfast, no lunch ,and then dinner out at restaurants w/ family. Pt stated she drinks very little water. Her diarrhea Sx continues w/ report of her anus feeling "raw".    Pertinent History Hx of two childbirths, (C-sections). appendectomy, Pt feels she does not have a routine and feels she does not eat well. Pt has not had a regular routine for the past 2 yrs since foot surgery 2012. Hx of R ankle surgery (ORIF) due to a fall 2012. Pt enjoyed Zumba, jog-walk prior to surgery.    Patient Stated Goals 1) improve balance 2) exercise that she enjoys and destressing relaxation techniques.             [facility] PT Assessment - 08/12/15 2235    Observation/Other Assessments   Observations less elevated upper traps, more relaxed appearance   Palpation   Spinal mobility increased thoracic kyphosis, upper trap tensions bilaterally                     [facility] Adult PT Treatment/Exercise - 08/12/15 2236    Bed Mobility   Bed Mobility --  cued for log rolling   Self-Care  Other Self-Care Comments  strategies to improve stool consistency   see pt instructions   Neuro Re-ed    Neuro Re-ed Details  seated posture to decrease posterior position of ribcage over pelvis to minimize thoracic kyphosis   difficulty w/ diaphragmatic breathing in seated position   Moist Heat Therapy   Number Minutes Moist Heat 10 Minutes   Moist Heat Location Other (comment)  whole back, skin intact post Tx   Manual Therapy   Joint Mobilization PA mobs in prone Grade II-III along all thoracic segments, Grade II CVJ bilaterally    Soft tissue mobilization upper traps bilaterally                 PT Education - 08/12/15 2250    Education provided Yes   Education Details HEP, education of increased  fiber and water to counter diarrhea, use of soft wipes to maintain skin integrity of anus 2/2 frequent diarrhea.    Person(s) Educated Patient   Methods Explanation;Demonstration;Tactile cues;Verbal cues;Handout   Comprehension Verbalized understanding;Returned demonstration             PT Long Term Goals - 07/29/15 1256    PT LONG TERM GOAL #1   Title Pt will demo decreased scar restriction over suprapubic area and below breasts in order to promote diaphragmatic breathing and pelvic floor movement.    Time 12   Period Weeks   Status New   PT LONG TERM GOAL #2   Title Pt will decrease her ZUNG anxiety scale from 70% to < 50% in order to better manage IBS Sx.    Time 12   Period Weeks   Status New   PT LONG TERM GOAL #3   Title Pt will decrease her COREFO score from 34% to < 28% in order to show improved colorectal function.    Time 12   Period Weeks   Status New   PT LONG TERM GOAL #4   Title Pt will demo decreased DRA from 2 fingers to 1.5 fingers below sternum in order to improve intraabdominal pressure for digestive and cores tability functions.    Time 12   Period Weeks   PT LONG TERM GOAL #5   Title Pt will demo log rolling technique across 2 visits to decrease straining of pelvic floor and abdominal muscles with bed mobility.    Time 12   Period Weeks   Status New               Plan - 08/12/15 2240    Clinical Impression Statement Pt reported feeling "much better" at the end of session than when she arrived to today's visit. Pt showed better management of stress compared to [facility] but did require guided relaxation to shift focus from worrying about her dtr. Pt tolerated manual Tx well and showed decreased mm tensions of  upper trap and paraspinal mm after Tx. Pt showed improvement with diaphragmatic breathing in supine position but had difficulty in seated position. Initiated to changing eating habits to promote motility and better bowel formation. Addressed anal  hygiene 2/2 diarrhea. Plan to perform rectal exam per pt consent at next session. Pt will continue to require skilled PT to address remaining goals.    Pt will benefit from skilled therapeutic intervention in order to improve on the following deficits Abnormal gait;Increased muscle spasms;Decreased strength;Improper body mechanics;Postural dysfunction;Difficulty walking;Decreased activity tolerance;Decreased mobility;Decreased balance;Decreased endurance;Decreased scar mobility;Increased fascial restricitons;Decreased coordination;Decreased cognition;Hypomobility;Pain;Decreased range of motion  relaxation training   Rehab Potential  Good   PT Frequency 2x / week   PT Duration 12 weeks   PT Treatment/Interventions ADLs/Self Care Home Management;Cryotherapy;Aquatic Therapy;Moist Heat;Traction;Electrical Stimulation;Patient/family education;Neuromuscular re-education;Balance training;Therapeutic exercise;Therapeutic activities;Functional mobility training;Stair training;Gait training;Manual techniques;Scar mobilization;Taping;Splinting;Passive range of motion;Other (comment)  relaxation training   Consulted and Agree with Plan of Care Patient        Problem List Patient Active Problem List   Diagnosis Date Noted  . Benign essential HTN 06/22/2015  . Combined fat and carbohydrate induced hyperlipemia 06/22/2015  . Breathlessness on exertion 06/17/2015  . Pure hypercholesterolemia 04/22/2015  . Congestive heart failure with left ventricular systolic dysfunction ([place]) 03/24/2015  . Acquired hypothyroidism 02/04/2015  . Heart failure, systolic ([place]) 123456  . Anxiety 12/28/2014  . Allergic rhinitis 10/27/2014  . Bipolar 1 disorder, depressed ([place]) 10/27/2014  . Hypercholesterolemia without hypertriglyceridemia 10/27/2014  . Depression, major, recurrent, moderate ([place]) 10/27/2014  . Adult hypothyroidism 10/27/2014  . Anxiety, generalized 10/27/2014  . Essential (primary) hypertension  10/27/2014  . Colon polyp 10/27/2014  . Hypoparathyroidism ([place]) 02/14/2013    Jerl Mina ,PT, DPT, E-RYT  08/12/2015, 10:53 PM  [place] MAIN [facility] SERVICES [address] [place], [place], [address] Phone: [phone]   Fax:  [phone]  Name: Kaylee Gordon MRN: [id number] Date of Birth: 1960-09-13

## 2015-08-12 NOTE — Patient Instructions (Addendum)
Improve digestion to decrease diarrhea:   Coconut water   In the morning:  Drink 1 glass of luke warm water first (w/ lemon juice drops for cleansing the gut)   Before coffee Replace Pop Tarts with rice porridge "congee"  Or oatmeal  http://[email or web address]/blog/healing-porridge-congee "ongee is most easily prepared overnight in a crock pot. All you would need to do is set it up and cook on low while you sleep. If you do not have a crock pot, it can be simmered on the stove over very low heat. The grain to liquid ratio is 1:5 or 1:6. It is better to use too much water than too little, and is is said that the longer congee cooks, the more "powerful" it becomes. It is particularly excellent with a homemade stock."  Eat lunch   Have fruit snacks 2x/ day     _________________  Stretching routine:   Open book (from last session)  X 15 (each side)  Snow angels x 15

## 2015-08-13 ENCOUNTER — Telehealth: Payer: Self-pay | Admitting: Psychiatry

## 2015-08-13 NOTE — Telephone Encounter (Signed)
Spoke with patient today she indicated that they do need a letter with a specific date. I explained I would discuss that we would be able to assess her February 15, 2016. Patient states she has made an appointment with a therapist. AW

## 2015-08-17 ENCOUNTER — Ambulatory Visit: Payer: BC Managed Care – PPO | Admitting: Physical Therapy

## 2015-08-17 DIAGNOSIS — R279 Unspecified lack of coordination: Secondary | ICD-10-CM

## 2015-08-17 DIAGNOSIS — R2689 Other abnormalities of gait and mobility: Secondary | ICD-10-CM

## 2015-08-17 DIAGNOSIS — M6289 Other specified disorders of muscle: Secondary | ICD-10-CM

## 2015-08-17 DIAGNOSIS — M629 Disorder of muscle, unspecified: Secondary | ICD-10-CM | POA: Diagnosis not present

## 2015-08-17 NOTE — Patient Instructions (Signed)
    [email or web address]                    1:2 ratio paced breathing for  relaxation   inhale 1-2-3 pause , exhale 3-2-1 pause (3-5 breaths) ---> inhale  1-2-3 pause , exhale 6-5-4-3-2-1 pause     Focus toning a sound to allow for smoother exhalation and relaxation of ribcage (no pushing belly)

## 2015-08-17 NOTE — Telephone Encounter (Signed)
pt was called letter was faxed and confirmed

## 2015-08-17 NOTE — Therapy (Signed)
[place] MAIN [facility] SERVICES [address] [place], [place], [address] Phone: [phone]   Fax:  [phone]  Physical Therapy Treatment  Patient Details  Name: Kaylee Gordon MRN: [id number] Date of Birth: Aug 05, 1960 Referring Provider: Rayann Heman  Encounter Date: 08/17/2015      PT End of Session - 08/17/15 1419    Visit Number 5   Number of Visits 12   PT Start Time [id number]   PT Stop Time 1410   PT Time Calculation (min) 65 min   Activity Tolerance Patient tolerated treatment well;No increased pain   Behavior During Therapy [facility] for tasks assessed/performed      Past Medical History  Diagnosis Date  . Anxiety   . Depression   . Thyroid disease   . CHF (congestive heart failure) ([place])   . Cancer [facility])     SKIN CANCER  . Hypertension   . Asthma     Past Surgical History  Procedure Laterality Date  . Appendectomy    . Breast enhancement surgery    . Skin cancer excision    . Nasal sinus surgery    . Hemorrhoid surgery    . Colonoscopy with propofol N/A 02/22/2015    Procedure: COLONOSCOPY WITH PROPOFOL;  Surgeon: Josefine Class, MD;  Location: [facility] ENDOSCOPY;  Service: Endoscopy;  Laterality: N/A;  . Esophagogastroduodenoscopy (egd) with propofol N/A 02/22/2015    Procedure: ESOPHAGOGASTRODUODENOSCOPY (EGD) WITH PROPOFOL;  Surgeon: Josefine Class, MD;  Location: [facility] ENDOSCOPY;  Service: Endoscopy;  Laterality: N/A;  . Savory dilation N/A 02/22/2015    Procedure: SAVORY DILATION;  Surgeon: Josefine Class, MD;  Location: [facility] ENDOSCOPY;  Service: Endoscopy;  Laterality: N/A;  . Ankle surgery Right 2012    There were no vitals filed for this visit.  Visit Diagnosis:  Tight fascia  Lack of coordination  Poor balance  Decreased mobility      Subjective Assessment - 08/17/15 1308    Subjective Pt reported she has not had any diarrhea episodes since last session despite encountering an emotional break-down.  Pt  reported she has been seeing a new psychotherapist Miguel Dibble, PhD) and she found her session with her MD to be "really good" especailly after being triggered by previous stressors. Pt felt she has been able to "get out of her shell" and manage better.  Pt has started drinking more water and started  eating oatmeal and fruits. Pt reports she has urinary leakage with coughing, sneezing.      Pertinent History Hx of two childbirths, (C-sections). appendectomy, Pt feels she does not have a routine and feels she does not eat well. Pt has not had a regular routine for the past 2 yrs since foot surgery 2012. Hx of R ankle surgery (ORIF) due to a fall 2012. Pt enjoyed Zumba, jog-walk prior to surgery.    Patient Stated Goals 1) improve balance 2) exercise that she enjoys and destressing relaxation techniques.             [facility] PT Assessment - 08/17/15 1415    Coordination   Gross Motor Movements are Fluid and Coordinated --  abdominal straining w/ exhalation (decreased w/ neuro-reedu    Palpation   Palpation comment tensions noted below suprapubic pubic scar (decreased post-Tx)                      [facility] Adult PT Treatment/Exercise - 08/17/15 1415    Self-Care   Other Self-Care  Comments  education on self-care   guided relaxation    Neuro Re-ed    Neuro Re-ed Details  paced breathing  and vocalizing to smoother exhalation/ribcage depression and relaxation    Manual Therapy   Other Manual Therapy fascial release over abdominal scars                 PT Education - 08/17/15 1418    Education provided Yes   Education Details HEP, educated about sexual counseling referral when pt would like to seek more help with her relationship w/ her sexuality and husband    Person(s) Educated Patient   Methods Explanation;Demonstration;Tactile cues;Verbal cues;Handout   Comprehension Verbalized understanding;Returned demonstration             PT Long Term Goals - 08/17/15  1425    PT LONG TERM GOAL #1   Title Pt will demo decreased scar restriction over suprapubic area and below breasts in order to promote diaphragmatic breathing and pelvic floor movement.    Time 12   Period Weeks   Status New   PT LONG TERM GOAL #2   Title Pt will decrease her ZUNG anxiety scale from 70% to < 50% in order to better manage IBS Sx.    Time 12   Period Weeks   Status New   PT LONG TERM GOAL #3   Title Pt will decrease her COREFO score from 34% to < 28% in order to show improved colorectal function.    Time 12   Period Weeks   Status New   PT LONG TERM GOAL #4   Title Pt will demo decreased DRA from 2 fingers to 1.5 fingers below sternum in order to improve intraabdominal pressure for digestive and cores tability functions.    Time 12   Period Weeks   Status Achieved   PT LONG TERM GOAL #5   Title Pt will demo log rolling technique across 2 visits to decrease straining of pelvic floor and abdominal muscles with bed mobility.    Time 12   Period Weeks   Status New   Additional Long Term Goals   Additional Long Term Goals Yes   PT LONG TERM GOAL #6   Title Pt will demo no tenderness nor tensions in pelvic floor mm and normal pelvic floor ROM in order to improve QOL with urainry, sexual, and bowel function.    Time 12   Period Weeks   Status New   PT LONG TERM GOAL #7   Title Pt will demo no abdominal straining w/ exhalation in order to restore proper function of deep core mm to better manage stress and improve bowel function.   Time 12   Period Weeks   Status New               Plan - 08/17/15 1419    Clinical Impression Statement Pt continues to report feeling better in her back after last session. Pt remains compliant with recommendation to increase fiber and water. Pt's diarrhea and abdominal Sx are improving but pt will continue to require more PT to address coordination of diaphragm with pelvic floor mm in order to decrease pelvic floor mm tensions and  limited ROM.  Focused on decreasing abdominal scar adhesions to faciliate pelvic floor function. Pt required further neuro-reedu to faciliate optimal exhalation/ribcage depression without abdominal straining.  Pt responded to manual Tx/ neuro-reedu  without complaints. Progressed pt with additional relaxation techniques (paced breathing and provided article on self-compassion). Pt  is progressing well towards her goals. Decreasing pt to 1x/ week as pt is demonstraing improvement and more IND with HEP.  Reassess pelvic floor mm next session.   Pt will benefit from skilled therapeutic intervention in order to improve on the following deficits Abnormal gait;Increased muscle spasms;Decreased strength;Improper body mechanics;Postural dysfunction;Difficulty walking;Decreased activity tolerance;Decreased mobility;Decreased balance;Decreased endurance;Decreased scar mobility;Increased fascial restricitons;Decreased coordination;Decreased cognition;Hypomobility;Pain;Decreased range of motion  relaxation training   Rehab Potential Good   PT Frequency 1x / week   PT Duration 12 weeks   PT Treatment/Interventions ADLs/Self Care Home Management;Cryotherapy;Aquatic Therapy;Moist Heat;Traction;Electrical Stimulation;Patient/family education;Neuromuscular re-education;Balance training;Therapeutic exercise;Therapeutic activities;Functional mobility training;Stair training;Gait training;Manual techniques;Scar mobilization;Taping;Splinting;Passive range of motion;Other (comment)  relaxation training   Consulted and Agree with Plan of Care Patient        Problem List Patient Active Problem List   Diagnosis Date Noted  . Benign essential HTN 06/22/2015  . Combined fat and carbohydrate induced hyperlipemia 06/22/2015  . Breathlessness on exertion 06/17/2015  . Pure hypercholesterolemia 04/22/2015  . Congestive heart failure with left ventricular systolic dysfunction ([place]) 03/24/2015  . Acquired hypothyroidism  02/04/2015  . Heart failure, systolic ([place]) [id number]  . Anxiety 12/28/2014  . Allergic rhinitis 10/27/2014  . Bipolar 1 disorder, depressed ([place]) 10/27/2014  . Hypercholesterolemia without hypertriglyceridemia 10/27/2014  . Depression, major, recurrent, moderate ([place]) 10/27/2014  . Adult hypothyroidism 10/27/2014  . Anxiety, generalized 10/27/2014  . Essential (primary) hypertension 10/27/2014  . Colon polyp 10/27/2014  . Hypoparathyroidism ([place]) 02/14/2013    Jerl Mina ,PT, DPT, E-RYT  08/17/2015, 2:29 PM  [place] MAIN [facility] SERVICES [address] [place], [place], [address] Phone: [phone]   Fax:  [phone]  Name: TRINADEE RAYBON MRN: [id number] Date of Birth: 1960-11-11

## 2015-08-24 ENCOUNTER — Ambulatory Visit: Payer: BC Managed Care – PPO | Admitting: Physical Therapy

## 2015-08-24 ENCOUNTER — Encounter: Payer: Self-pay | Admitting: Psychiatry

## 2015-08-24 ENCOUNTER — Ambulatory Visit (INDEPENDENT_AMBULATORY_CARE_PROVIDER_SITE_OTHER): Payer: BC Managed Care – PPO | Admitting: Psychiatry

## 2015-08-24 VITALS — BP 122/76 | HR 100 | Temp 98.3°F | Ht 64.0 in | Wt 162.4 lb

## 2015-08-24 DIAGNOSIS — F411 Generalized anxiety disorder: Secondary | ICD-10-CM | POA: Diagnosis not present

## 2015-08-24 DIAGNOSIS — F331 Major depressive disorder, recurrent, moderate: Secondary | ICD-10-CM

## 2015-08-24 NOTE — Progress Notes (Signed)
BH MD/PA/NP OP Progress Note  08/24/2015 2:42 PM Kaylee Gordon  MRN:  [id number]  Subjective:  Patient returns for follow-up of anxiety and major depression.  Patient indicates that she continues to have significant anxiety. She discussed a situation in which she received a text on a Friday afternoon from the current principal. 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 Patient stated she did not want to go to the school at that time but would rather go to the school alone when there is less activity. She decided to wait until that Saturday morning when she knew there would not be people around. She states even when she went and then she felt herself "shut down." She was very anxious an emotional when she went to retrieve her things.   dates the other thing she is worried about his her daughter and the college application process. She states her daughter is been accepted some places but she's not thrilled with the daughter's current choices.    States then she is also her about her son who is a Equities trader in college and she is worried about him getting situated  Upon graduation.   She states her emotions take a toll on her home life. She states when she is shut down her husband then can get on her and be demanding. She states at one point she recalled an incident where she was tearful. She does try to occasionally get out to give herself a break from the stress in her anxiety.  Chief Complaint:  Chief Complaint    Follow-up; Medication Refill     Visit Diagnosis:     ICD-9-CM ICD-10-CM   1. GAD (generalized anxiety disorder) 300.02 F41.1   2. Major depressive disorder, recurrent episode, moderate (HCC) 296.32 F33.1     Past Medical History:  Past Medical History  Diagnosis Date  . Anxiety   . Depression   . Thyroid disease   . CHF (congestive heart failure) ([place])   . Cancer [facility])     SKIN  CANCER  . Hypertension   . Asthma     Past Surgical History  Procedure Laterality Date  . Appendectomy    . Breast enhancement surgery    . Skin cancer excision    . Nasal sinus surgery    . Hemorrhoid surgery    . Colonoscopy with propofol N/A 02/22/2015    Procedure: COLONOSCOPY WITH PROPOFOL;  Surgeon: Josefine Class, MD;  Location: [facility] ENDOSCOPY;  Service: Endoscopy;  Laterality: N/A;  . Esophagogastroduodenoscopy (egd) with propofol N/A 02/22/2015    Procedure: ESOPHAGOGASTRODUODENOSCOPY (EGD) WITH PROPOFOL;  Surgeon: Josefine Class, MD;  Location: [facility] ENDOSCOPY;  Service: Endoscopy;  Laterality: N/A;  . Savory dilation N/A 02/22/2015    Procedure: SAVORY DILATION;  Surgeon: Josefine Class, MD;  Location: [facility] ENDOSCOPY;  Service: Endoscopy;  Laterality: N/A;  . Ankle surgery Right 2012   Family History:  Family History  Problem Relation Age of Onset  . Cancer Mother   . Depression Mother   . Anxiety disorder Mother   . Heart disease Father   . Cancer Father   . Anxiety disorder Father   . Depression Father   . Hypoparathyroidism Son    Social History:  Social History   Social History  . Marital Status: Married    Spouse Name: N/A  . Number of Children:  N/A  . Years of Education: N/A   Social History Main Topics  . Smoking status: Never Smoker   . Smokeless tobacco: Never Used  . Alcohol Use: 0.0 oz/week    0 Standard drinks or equivalent per week     Comment: MAYBE ONCE OR TWICE A MONTH  . Drug Use: No  . Sexual Activity: No   Other Topics Concern  . None   Social History Narrative   Additional History:   Assessment:  Patient thanked Probation officer for trying to help her and gave Probation officer an unsolicited hug. Musculoskeletal: Strength & Muscle Tone: within normal limits Gait & Station: normal Patient leans: N/A  Psychiatric Specialty Exam: Depression        Associated symptoms include does not have insomnia and no suicidal ideas.  Past medical  history includes anxiety.   Anxiety Symptoms include nervous/anxious behavior. Patient reports no insomnia or suicidal ideas.      Review of Systems  Psychiatric/Behavioral: Negative for depression, suicidal ideas, hallucinations, memory loss and substance abuse. The patient is nervous/anxious. The patient does not have insomnia.   All other systems reviewed and are negative.   Blood pressure 122/76, pulse 100, temperature 98.3 F (36.8 C), temperature source Tympanic, height 5\' 4"  (1.626 m), weight 162 lb 6.4 oz (73.664 kg), SpO2 98 %.Body mass index is 27.86 kg/(m^2).  General Appearance: Neat and Well Groomed  Eye Contact:  Good  Speech:  Clear and Coherent and Normal Rate  Volume:  Normal  Mood:  good  Affect:  Anxious   Thought Process:  Circumstantial  Orientation:  Full (Time, Place, and Person)  Thought Content:  Negative  Suicidal Thoughts:  No  Homicidal Thoughts:  No  Memory:  Immediate;   Good Recent;   Good Remote;   Good  Judgement:  Good  Insight:  Good  Psychomotor Activity:  Negative and Normal  Concentration:  Good  Recall:  Good  Fund of Knowledge: Good  Language: Good  Akathisia:  Negative  Handed:  Right  AIMS (if indicated):  Not done  Assets:  Communication Skills Desire for Improvement Vocational/Educational  ADL's:  Intact  Cognition: WNL  Sleep:  Sleeping more on weekends    Is the patient at risk to self?  No. Has the patient been a risk to self in the past 6 months?  No. Has the patient been a risk to self within the distant past?  No. Is the patient a risk to others?  No. Has the patient been a risk to others in the past 6 months?  No. Has the patient been a risk to others within the distant past?  No.  Current Medications: Current Outpatient Prescriptions  Medication Sig Dispense Refill  . albuterol (PROVENTIL, VENTOLIN) (5 MG/ML) 0.5% NEBU Inhale into the lungs. Reported on 07/07/2015    . calcitRIOL (ROCALTROL) 0.25 MCG capsule  TAKE 1 CAPSULE BY MOUTH DAILY. 30 capsule 0  . celecoxib (CELEBREX) 200 MG capsule Take 200 mg by mouth 2 (two) times daily.    Marland Kitchen desvenlafaxine (PRISTIQ) 50 MG 24 hr tablet Take 1 tablet (50 mg total) by mouth daily. 30 tablet 4  . diazepam (VALIUM) 10 MG tablet Take 1 tablet (10 mg total) by mouth 2 (two) times daily. 60 tablet 4  . dicyclomine (BENTYL) 10 MG capsule Take by mouth.    . fluticasone (FLONASE) 50 MCG/ACT nasal spray     . levothyroxine (SYNTHROID, LEVOTHROID) 50 MCG tablet Take by mouth.    Marland Kitchen  lisinopril (PRINIVIL,ZESTRIL) 5 MG tablet TAKE 1 TABLET BY MOUTH ONCE A DAY    . lovastatin (MEVACOR) 20 MG tablet Take 20 mg by mouth at bedtime.    Marland Kitchen omeprazole (PRILOSEC) 40 MG capsule     . PRISTIQ 50 MG 24 hr tablet TAKE 1 TABLET (50 MG TOTAL) BY MOUTH EVERY MORNING. 30 tablet 4  . PROVENTIL HFA 108 (90 BASE) MCG/ACT inhaler     . QUEtiapine (SEROQUEL) 200 MG tablet Take 1 tablet (200 mg total) by mouth at bedtime. 30 tablet 4  . ZIANA gel APPLY TO AFFECTED AREA ON THE SKIN AT BEDTIME  3  . ZOSTAVAX 57846 UNT/0.65ML injection      No current facility-administered medications for this visit.    Medical Decision Making:  Established Problem, Stable/Improving (1)  Treatment Plan Summary:Medication management Major depressive disorder- Continue Pristiq 50 mg in the morning. She feels the Pristiq has helped her mood. She will continue her Seroquel 200 mg at bedtime.    Generalized anxiety disorder-this still continues to be present in multiple arenas with the exception of work at this time because she is not going to work. Continues to be an issue and is evident by visible anxiety and near tearfulness when discussing social and occupational factors that lead to her anxiety.  Patient did make contact with the therapist that I referred her to. She states she is seen that therapist twice and does feel good about the interactions thus far.  She'll follow-up in 1 month. He is aware that  will be her last appointment with this writer and that she'll transition to another doctor in this practice. She's been encouraged call with questions concerns prior to her next appointment.  Faith Rogue 08/24/2015, 2:42 PM

## 2015-08-31 ENCOUNTER — Ambulatory Visit: Payer: BC Managed Care – PPO | Attending: Gastroenterology | Admitting: Physical Therapy

## 2015-08-31 DIAGNOSIS — R2689 Other abnormalities of gait and mobility: Secondary | ICD-10-CM

## 2015-08-31 DIAGNOSIS — M6289 Other specified disorders of muscle: Secondary | ICD-10-CM

## 2015-08-31 DIAGNOSIS — R279 Unspecified lack of coordination: Secondary | ICD-10-CM | POA: Diagnosis present

## 2015-08-31 DIAGNOSIS — M629 Disorder of muscle, unspecified: Secondary | ICD-10-CM | POA: Diagnosis not present

## 2015-08-31 NOTE — Patient Instructions (Addendum)
WATER GOAL this week  Half a glass of water before coffee 16 fl oz of water by the afternoon (use a stainless steel water bottle)   GOAL: increase water intake to equal bladder irritant (coffee)    1:1 ratio this week, next week 2 water: 1 coffee no tea    Aim for 3x/ week Mon. Wed, Fri   __________________  REMEMBERING to eat GOAL:  EATING a snack in the afternoon   GOAL: Set a timer on phone 2pm, 2:30pm to remind you of a snack  GOAL: Select one item:  apple, banana, snack bar (to increase fiber)  GOAL: Eat snack in a calm place, no multitasking and practice mindful eating     BOOK resources for nutrition and eating:   RoulettePays.com.br http://rios.biz/ [employer]Ex[employer]cz  Let PT know when you are ready for a referral to a nutritionist or health coach   _____________________________________  Incorporate more fun and self-soothing activities like: going to the movies alone, seeing a play with son, and movies with girlfriend

## 2015-09-01 NOTE — Therapy (Signed)
[place] MAIN [facility] SERVICES [address] [place], [place], [address] Phone: [phone]   Fax:  [phone]  Physical Therapy Treatment  Patient Details  Name: Kaylee Gordon MRN: [id number] Date of Birth: 1961/03/04 Referring Provider: Rayann Heman  Encounter Date: 08/31/2015      PT End of Session - 09/01/15 0003    Visit Number 6   Number of Visits 12   PT Start Time 1330   PT Stop Time 1430   PT Time Calculation (min) 60 min      Past Medical History  Diagnosis Date  . Anxiety   . Depression   . Thyroid disease   . CHF (congestive heart failure) ([place])   . Cancer [facility])     SKIN CANCER  . Hypertension   . Asthma     Past Surgical History  Procedure Laterality Date  . Appendectomy    . Breast enhancement surgery    . Skin cancer excision    . Nasal sinus surgery    . Hemorrhoid surgery    . Colonoscopy with propofol N/A 02/22/2015    Procedure: COLONOSCOPY WITH PROPOFOL;  Surgeon: Josefine Class, MD;  Location: [facility] ENDOSCOPY;  Service: Endoscopy;  Laterality: N/A;  . Esophagogastroduodenoscopy (egd) with propofol N/A 02/22/2015    Procedure: ESOPHAGOGASTRODUODENOSCOPY (EGD) WITH PROPOFOL;  Surgeon: Josefine Class, MD;  Location: [facility] ENDOSCOPY;  Service: Endoscopy;  Laterality: N/A;  . Savory dilation N/A 02/22/2015    Procedure: SAVORY DILATION;  Surgeon: Josefine Class, MD;  Location: [facility] ENDOSCOPY;  Service: Endoscopy;  Laterality: N/A;  . Ankle surgery Right 2012    There were no vitals filed for this visit.  Visit Diagnosis:  Tight fascia  Lack of coordination  Poor balance  Decreased mobility      Subjective Assessment - 08/31/15 1338    Subjective Pt returns to PT after missing 2 weeks of PT due to feeling sick and caring for her dtr. Pt stated her diarrhea returned during these 2 weeks due to "her nerves."  Pt found the breathing to help when she was crying. Pt found opportunities to enjoy  community events with her friends and friends which helped her to step away from stress in her home. Pt relapsed on healthy eating habits. Pt has stopped eating Pop Tarts and has incorporated fruits. Pt states she does not eat enough of anything and when she does, "everything goes through her very quickly."   Pt does not feel hungry and does not want to get fat. Her husband tells pt that she is not eating right. Pt reported she has had a headache for the past 2 weeks that won't go away. Pt also reported she does not drink enought water.( 1 cup of coffee, 1 cup of tea, and half a glass of water).        Pertinent History Hx of two childbirths, (C-sections). appendectomy, Pt feels she does not have a routine and feels she does not eat well. Pt has not had a regular routine for the past 2 yrs since foot surgery 2012. Hx of R ankle surgery (ORIF) due to a fall 2012. Pt enjoyed Zumba, jog-walk prior to surgery.    Patient Stated Goals 1) improve balance 2) exercise that she enjoys and destressing relaxation techniques.             [facility] PT Assessment - 09/01/15 0002    Coordination   Gross Motor Movements are Fluid and Coordinated --  less abdominal straining w/ exhalation, smoother    Palpation   Spinal mobility decreased upper trap/ midback mm tensions    Palpation comment significantly decreased scar restrictions over abdomen                  Pelvic Floor Special Questions - 09/01/15 0014    External Palpation through clothing, noted intially increased mm tensions at perineal transverse mm bilaterally, with cue for breathing, mm decreased            [facility] Adult PT Treatment/Exercise - 09/01/15 0002    Self-Care   Other Self-Care Comments  see pt instuctions    Therapeutic Activites    Therapeutic Activities --  see pt instructions    Neuro Re-ed    Neuro Re-ed Details  breathing cues for smoother exhalation , tactile cues for ribcage depression, cue for breathing/pelvic  neutral position to decrease pelvic mm tensions                  PT Education - 08/31/15 1411    Education provided Yes   Education Details HEP   Person(s) Educated Patient   Methods Explanation;Demonstration;Tactile cues;Verbal cues;Handout   Comprehension Verbalized understanding;Returned demonstration             PT Long Term Goals - 08/31/15 1337    PT LONG TERM GOAL #1   Title Pt will demo decreased scar restriction over suprapubic area and below breasts in order to promote diaphragmatic breathing and pelvic floor movement.    Time 12   Period Weeks   Status New   PT LONG TERM GOAL #2   Title Pt will decrease her ZUNG anxiety scale from 70% to < 50% in order to better manage IBS Sx.    Time 12   Period Weeks   Status New   PT LONG TERM GOAL #3   Title Pt will decrease her COREFO score from 34% to < 28% in order to show improved colorectal function.    Time 12   Period Weeks   Status New   PT LONG TERM GOAL #4   Title Pt will demo decreased DRA from 2 fingers to 1.5 fingers below sternum in order to improve intraabdominal pressure for digestive and cores tability functions.    Time 12   Period Weeks   Status Achieved   PT LONG TERM GOAL #5   Title Pt will demo log rolling technique across 2 visits to decrease straining of pelvic floor and abdominal muscles with bed mobility.    Time 12   Period Weeks   Status New   PT LONG TERM GOAL #6   Title Pt will demo no tenderness nor tensions in pelvic floor mm and normal pelvic floor ROM in order to improve QOL with urainry, sexual, and bowel function.    Time 12   Period Weeks   Status New   PT LONG TERM GOAL #7   Title Pt will demo no abdominal straining w/ exhalation in order to restore proper function of deep core mm to better manage stress and improve bowel function.   Time 12   Period Weeks   Status New               Plan - 09/01/15 0005    Clinical Impression Statement Pt had a relapse of  diarrhea Sx over the past two weeks due to stress and insufficient intake of water and food. Pt has issues related to food and  eating  and would benefit from a referral to a psychotherapist who specializes in eating disorders as well as a nutritionist/ Engineer, maintenance.  Pt remains motivated  to return to working out and joining Zumba class which she used to enjoy doing before her LE issues. However, PT educated pt on the priority for pt to keep up with her water and food intake before returning to exercising. PT brainstormed and collaborated with pt on setting concrete ways to address this. PT provided web resources on health eating. Pt voiced understanding. During session, pt drank a cup of water that PT provided.  Despite missing two weeks of PT, pt showed good carry over with decreased abdominal separation and decreased mm tensions but required continued cuing for complete exhalation to relax thorax and elevated shoulders to induce a more relaxed response.  Pt was able to decrease her pelvic floor mm tensions with cue for coordination breathing and finding pelvic neutral/   Pt received heavy education with biopsychosocial approaches today. Re- asssess pelvic floor next session.        Pt will benefit from skilled therapeutic intervention in order to improve on the following deficits Abnormal gait;Increased muscle spasms;Decreased strength;Improper body mechanics;Postural dysfunction;Difficulty walking;Decreased activity tolerance;Decreased mobility;Decreased balance;Decreased endurance;Decreased scar mobility;Increased fascial restricitons;Decreased coordination;Decreased cognition;Hypomobility;Pain;Decreased range of motion  relaxation training   Rehab Potential Good   PT Frequency 1x / week   PT Duration 12 weeks   PT Treatment/Interventions ADLs/Self Care Home Management;Cryotherapy;Aquatic Therapy;Moist Heat;Traction;Electrical Stimulation;Patient/family education;Neuromuscular re-education;Balance  training;Therapeutic exercise;Therapeutic activities;Functional mobility training;Stair training;Gait training;Manual techniques;Scar mobilization;Taping;Splinting;Passive range of motion;Other (comment)  relaxation training   Consulted and Agree with Plan of Care Patient        Problem List Patient Active Problem List   Diagnosis Date Noted  . Benign essential HTN 06/22/2015  . Combined fat and carbohydrate induced hyperlipemia 06/22/2015  . Breathlessness on exertion 06/17/2015  . Pure hypercholesterolemia 04/22/2015  . Congestive heart failure with left ventricular systolic dysfunction ([place]) 03/24/2015  . Acquired hypothyroidism 02/04/2015  . Heart failure, systolic ([place]) 123456  . Anxiety 12/28/2014  . Allergic rhinitis 10/27/2014  . Bipolar 1 disorder, depressed ([place]) 10/27/2014  . Hypercholesterolemia without hypertriglyceridemia 10/27/2014  . Depression, major, recurrent, moderate ([place]) 10/27/2014  . Adult hypothyroidism 10/27/2014  . Anxiety, generalized 10/27/2014  . Essential (primary) hypertension 10/27/2014  . Colon polyp 10/27/2014  . Hypoparathyroidism ([place]) 02/14/2013    Jerl Mina ,PT, DPT, E-RYT  09/01/2015, 12:24 AM  [place] MAIN [facility] SERVICES [address] [place], [place], [address] Phone: [phone]   Fax:  [phone]  Name: Kaylee Gordon MRN: [id number] Date of Birth: [birth year]

## 2015-09-03 ENCOUNTER — Emergency Department
Admission: EM | Admit: 2015-09-03 | Discharge: 2015-09-03 | Disposition: A | Discharge status: Home / Self Care | Payer: BC Managed Care – PPO | Attending: Emergency Medicine | Admitting: Emergency Medicine

## 2015-09-03 ENCOUNTER — Encounter: Payer: Self-pay | Admitting: Emergency Medicine

## 2015-09-03 DIAGNOSIS — R197 Diarrhea, unspecified: Secondary | ICD-10-CM

## 2015-09-03 DIAGNOSIS — R1032 Left lower quadrant pain: Secondary | ICD-10-CM

## 2015-09-03 DIAGNOSIS — Z791 Long term (current) use of non-steroidal anti-inflammatories (NSAID): Secondary | ICD-10-CM | POA: Diagnosis not present

## 2015-09-03 DIAGNOSIS — I10 Essential (primary) hypertension: Secondary | ICD-10-CM | POA: Diagnosis not present

## 2015-09-03 DIAGNOSIS — Z79899 Other long term (current) drug therapy: Secondary | ICD-10-CM | POA: Insufficient documentation

## 2015-09-03 DIAGNOSIS — Z7951 Long term (current) use of inhaled steroids: Secondary | ICD-10-CM | POA: Diagnosis not present

## 2015-09-03 LAB — CBC WITH DIFFERENTIAL/PLATELET
Basophils Absolute: 0 10*3/uL (ref 0–0.1)
EOS ABS: 0.1 10*3/uL (ref 0–0.7)
HCT: 37.2 % (ref 35.0–47.0)
HEMOGLOBIN: 12.6 g/dL (ref 12.0–16.0)
LYMPHS ABS: 1 10*3/uL (ref 1.0–3.6)
Lymphocytes Relative: 20 %
MCH: 31.8 pg (ref 26.0–34.0)
MCHC: 33.8 g/dL (ref 32.0–36.0)
MCV: 94.2 fL (ref 80.0–100.0)
Monocytes Absolute: 0.5 10*3/uL (ref 0.2–0.9)
Monocytes Relative: 10 %
Neutro Abs: 3.4 10*3/uL (ref 1.4–6.5)
PLATELETS: 212 10*3/uL (ref 150–440)
RBC: 3.95 MIL/uL (ref 3.80–5.20)
RDW: 13.2 % (ref 11.5–14.5)
WBC: 5 10*3/uL (ref 3.6–11.0)

## 2015-09-03 LAB — COMPREHENSIVE METABOLIC PANEL
ALBUMIN: 4.5 g/dL (ref 3.5–5.0)
ALT: 26 U/L (ref 14–54)
ANION GAP: 8 (ref 5–15)
AST: 28 U/L (ref 15–41)
Alkaline Phosphatase: 94 U/L (ref 38–126)
BUN: 19 mg/dL (ref 6–20)
CHLORIDE: 105 mmol/L (ref 101–111)
CO2: 26 mmol/L (ref 22–32)
Calcium: 9.2 mg/dL (ref 8.9–10.3)
Creatinine, Ser: 0.91 mg/dL (ref 0.44–1.00)
GFR calc non Af Amer: >60 mL/min (ref >60)
GLUCOSE: 115 mg/dL — AB (ref 65–99)
Potassium: 3.7 mmol/L (ref 3.5–5.1)
SODIUM: 139 mmol/L (ref 135–145)
Total Bilirubin: 0.7 mg/dL (ref 0.3–1.2)
Total Protein: 7.6 g/dL (ref 6.5–8.1)

## 2015-09-03 LAB — C DIFFICILE QUICK SCREEN W PCR REFLEX
C DIFFICILE (CDIFF) INTERP: NEGATIVE
C DIFFICILE (CDIFF) TOXIN: NEGATIVE
C Diff antigen: NEGATIVE

## 2015-09-03 MED ORDER — CIPROFLOXACIN HCL 500 MG PO TABS: 500.0000 mg | ORAL_TABLET | Freq: Two times a day (BID) | ORAL | Status: AC

## 2015-09-03 MED ORDER — METRONIDAZOLE 500 MG PO TABS: 500.0000 mg | ORAL_TABLET | Freq: Three times a day (TID) | ORAL | Status: AC

## 2015-09-03 NOTE — ED Notes (Signed)
Reports diarrhea x 1 wk.

## 2015-09-03 NOTE — ED Provider Notes (Signed)
[facility] Emergency Department Provider Note    ____________________________________________  Time seen: ~1650  I have reviewed the triage vital signs and the nursing notes.   HISTORY  Chief Complaint Diarrhea   History limited by: Not Limited   HPI Kaylee Gordon is a 55 y.o. female who presents to the emergency department today because of concerns for diarrhea and abdominal pain. She states she has been having diarrhea for the past 3 weeks. It has been constant. It is progressively gotten worse. For the past few days patient is also noticed some blood in the diarrhea. The patient has had associated symptoms of abdominal pain. Is worse on the left side. Has been mild to moderate. It comes and goes. Patient states she has a history of issues with diarrhea. She called her GI doctor office's today who advised her to come to the emergency department. Patient denies any fevers. Denies any chest pain or shortness of breath.     Past Medical History  Diagnosis Date  . Anxiety   . Depression   . Thyroid disease   . CHF (congestive heart failure) ([place])   . Cancer [facility])     SKIN CANCER  . Hypertension   . Asthma     Patient Active Problem List   Diagnosis Date Noted  . Benign essential HTN 06/22/2015  . Combined fat and carbohydrate induced hyperlipemia 06/22/2015  . Breathlessness on exertion 06/17/2015  . Pure hypercholesterolemia 04/22/2015  . Congestive heart failure with left ventricular systolic dysfunction ([place]) 03/24/2015  . Acquired hypothyroidism 02/04/2015  . Heart failure, systolic ([place]) 123456  . Anxiety 12/28/2014  . Allergic rhinitis 10/27/2014  . Bipolar 1 disorder, depressed ([place]) 10/27/2014  . Hypercholesterolemia without hypertriglyceridemia 10/27/2014  . Depression, major, recurrent, moderate ([place]) 10/27/2014  . Adult hypothyroidism 10/27/2014  . Anxiety, generalized 10/27/2014  . Essential (primary) hypertension  10/27/2014  . Colon polyp 10/27/2014  . Hypoparathyroidism ([place]) 02/14/2013    Past Surgical History  Procedure Laterality Date  . Appendectomy    . Breast enhancement surgery    . Skin cancer excision    . Nasal sinus surgery    . Hemorrhoid surgery    . Colonoscopy with propofol N/A 02/22/2015    Procedure: COLONOSCOPY WITH PROPOFOL;  Surgeon: Josefine Class, MD;  Location: [facility] ENDOSCOPY;  Service: Endoscopy;  Laterality: N/A;  . Esophagogastroduodenoscopy (egd) with propofol N/A 02/22/2015    Procedure: ESOPHAGOGASTRODUODENOSCOPY (EGD) WITH PROPOFOL;  Surgeon: Josefine Class, MD;  Location: [facility] ENDOSCOPY;  Service: Endoscopy;  Laterality: N/A;  . Savory dilation N/A 02/22/2015    Procedure: SAVORY DILATION;  Surgeon: Josefine Class, MD;  Location: [facility] ENDOSCOPY;  Service: Endoscopy;  Laterality: N/A;  . Ankle surgery Right 2012    Current Outpatient Rx  Name  Route  Sig  Dispense  Refill  . albuterol (PROVENTIL, VENTOLIN) (5 MG/ML) 0.5% NEBU   Inhalation   Inhale into the lungs. Reported on 07/07/2015         . calcitRIOL (ROCALTROL) 0.25 MCG capsule      TAKE 1 CAPSULE BY MOUTH DAILY.   30 capsule   0     CYCLE FILL MEDICATION. Authorization is required f ...   . celecoxib (CELEBREX) 200 MG capsule   Oral   Take 200 mg by mouth 2 (two) times daily.         Marland Kitchen desvenlafaxine (PRISTIQ) 50 MG 24 hr tablet   Oral   Take 1 tablet (50  mg total) by mouth daily.   30 tablet   4   . diazepam (VALIUM) 10 MG tablet   Oral   Take 1 tablet (10 mg total) by mouth 2 (two) times daily.   60 tablet   4   . dicyclomine (BENTYL) 10 MG capsule   Oral   Take by mouth.         . fluticasone (FLONASE) 50 MCG/ACT nasal spray               . levothyroxine (SYNTHROID, LEVOTHROID) 50 MCG tablet   Oral   Take by mouth.         Marland Kitchen lisinopril (PRINIVIL,ZESTRIL) 5 MG tablet      TAKE 1 TABLET BY MOUTH ONCE A DAY         . lovastatin (MEVACOR) 20 MG  tablet   Oral   Take 20 mg by mouth at bedtime.         Marland Kitchen omeprazole (PRILOSEC) 40 MG capsule               . PRISTIQ 50 MG 24 hr tablet      TAKE 1 TABLET (50 MG TOTAL) BY MOUTH EVERY MORNING.   30 tablet   4     CYCLE FILL MEDICATION. Authorization is required f ...   . PROVENTIL HFA 108 (90 BASE) MCG/ACT inhaler                 Dispense as written.   Marland Kitchen QUEtiapine (SEROQUEL) 200 MG tablet   Oral   Take 1 tablet (200 mg total) by mouth at bedtime.   30 tablet   4   . ZIANA gel      APPLY TO AFFECTED AREA ON THE SKIN AT BEDTIME      3     Dispense as written.   Marland Kitchen ZOSTAVAX 60454 UNT/0.65ML injection                 Allergies Parafon forte dsc; Pollen extract; and Sudafed  Family History  Problem Relation Age of Onset  . Cancer Mother   . Depression Mother   . Anxiety disorder Mother   . Heart disease Father   . Cancer Father   . Anxiety disorder Father   . Depression Father   . Hypoparathyroidism Son     Social History Social History  Substance Use Topics  . Smoking status: Never Smoker   . Smokeless tobacco: Never Used  . Alcohol Use: 0.0 oz/week    0 Standard drinks or equivalent per week     Comment: MAYBE ONCE OR TWICE A MONTH    Review of Systems  Constitutional: Negative for fever. Cardiovascular: Negative for chest pain. Respiratory: Negative for shortness of breath. Gastrointestinal: Positive for abdominal pain and diarrhea Genitourinary: Negative for dysuria. Neurological: Negative for headaches, focal weakness or numbness.   10-point ROS otherwise negative.  ____________________________________________   PHYSICAL EXAM:  VITAL SIGNS: ED Triage Vitals  Enc Vitals Group     BP 09/03/15 1358 114/80 mmHg     Pulse Rate 09/03/15 1358 105     Resp 09/03/15 1358 20     Temp 09/03/15 1358 99 F (37.2 C)     Temp Source 09/03/15 1358 Oral     SpO2 09/03/15 1358 98 %     Weight 09/03/15 1358 159 lb (72.122 kg)      Height 09/03/15 1358 5\' 4"  (1.626 m)     Head Cir --  Peak Flow --      Pain Score 09/03/15 1359 7   Constitutional: Alert and oriented. Well appearing and in no distress. Eyes: Conjunctivae are normal. PERRL. Normal extraocular movements. ENT   Head: Normocephalic and atraumatic.   Nose: No congestion/rhinnorhea.   Mouth/Throat: Mucous membranes are moist.   Neck: No stridor. Hematological/Lymphatic/Immunilogical: No cervical lymphadenopathy. Cardiovascular: Normal rate, regular rhythm.  No murmurs, rubs, or gallops. Respiratory: Normal respiratory effort without tachypnea nor retractions. Breath sounds are clear and equal bilaterally. No wheezes/rales/rhonchi. Gastrointestinal: Soft and tender to palpation in the left lower quadrant. No rebound. No guarding. Genitourinary: Deferred Musculoskeletal: Normal range of motion in all extremities. No joint effusions.  No lower extremity tenderness nor edema. Neurologic:  Normal speech and language. No gross focal neurologic deficits are appreciated.  Skin:  Skin is warm, dry and intact. No rash noted. Psychiatric: Mood and affect are normal. Speech and behavior are normal. Patient exhibits appropriate insight and judgment.  ____________________________________________    LABS (pertinent positives/negatives)  Labs Reviewed  COMPREHENSIVE METABOLIC PANEL - Abnormal; Notable for the following:    Glucose, Bld 115 (*)    All other components within normal limits  C DIFFICILE QUICK SCREEN W PCR REFLEX  CBC WITH DIFFERENTIAL/PLATELET     ____________________________________________   EKG  None  ____________________________________________    RADIOLOGY  None  ____________________________________________   PROCEDURES  Procedure(s) performed: None  Critical Care performed: No  ____________________________________________   INITIAL IMPRESSION / ASSESSMENT AND PLAN / ED COURSE  Pertinent labs &  imaging results that were available during my care of the patient were reviewed by me and considered in my medical decision making (see chart for details).  Patient presented to the emergency department today because of concerns for diarrhea and abdominal pain. On exam patient does have tenderness to the left lower quadrant. Discussion with the patient she states she believes she was told she had diverticulitis and her last colonoscopy. Patient's blood work without any concerning findings. I did discuss with patient my concern that she could have diverticulitis given results of the colonoscopy and the pain and diarrhea. This point the decision was made to defer CT scan and treat empirically for diverticulitis. Discussed with patient importance of following up with GI doctor.  ____________________________________________   FINAL CLINICAL IMPRESSION(S) / ED DIAGNOSES  Final diagnoses:  Diarrhea, unspecified type  Left lower quadrant pain     Nance Pear, MD 09/03/15 1739

## 2015-09-03 NOTE — Discharge Instructions (Signed)
Please seek medical attention for any high fevers, chest pain, shortness of breath, change in behavior, persistent vomiting, bloody stool or any other new or concerning symptoms.  Diverticulitis Diverticulitis is inflammation or infection of small pouches in your colon that form when you have a condition called diverticulosis. The pouches in your colon are called diverticula. Your colon, or large intestine, is where water is absorbed and stool is formed. Complications of diverticulitis can include:  Bleeding.  Severe infection.  Severe pain.  Perforation of your colon.  Obstruction of your colon. CAUSES  Diverticulitis is caused by bacteria. Diverticulitis happens when stool becomes trapped in diverticula. This allows bacteria to grow in the diverticula, which can lead to inflammation and infection. RISK FACTORS People with diverticulosis are at risk for diverticulitis. Eating a diet that does not include enough fiber from fruits and vegetables may make diverticulitis more likely to develop. SYMPTOMS  Symptoms of diverticulitis may include:  Abdominal pain and tenderness. The pain is normally located on the left side of the abdomen, but may occur in other areas.  Fever and chills.  Bloating.  Cramping.  Nausea.  Vomiting.  Constipation.  Diarrhea.  Blood in your stool. DIAGNOSIS  Your health care provider will ask you about your medical history and do a physical exam. You may need to have tests done because many medical conditions can cause the same symptoms as diverticulitis. Tests may include:  Blood tests.  Urine tests.  Imaging tests of the abdomen, including X-rays and CT scans. When your condition is under control, your health care provider may recommend that you have a colonoscopy. A colonoscopy can show how severe your diverticula are and whether something else is causing your symptoms. TREATMENT  Most cases of diverticulitis are mild and can be treated at  home. Treatment may include:  Taking over-the-counter pain medicines.  Following a clear liquid diet.  Taking antibiotic medicines by mouth for 7-10 days. More severe cases may be treated at a hospital. Treatment may include:  Not eating or drinking.  Taking prescription pain medicine.  Receiving antibiotic medicines through an IV tube.  Receiving fluids and nutrition through an IV tube.  Surgery. HOME CARE INSTRUCTIONS   Follow your health care provider's instructions carefully.  Follow a full liquid diet or other diet as directed by your health care provider. After your symptoms improve, your health care provider may tell you to change your diet. He or she may recommend you eat a high-fiber diet. Fruits and vegetables are good sources of fiber. Fiber makes it easier to pass stool.  Take fiber supplements or probiotics as directed by your health care provider.  Only take medicines as directed by your health care provider.  Keep all your follow-up appointments. SEEK MEDICAL CARE IF:   Your pain does not improve.  You have a hard time eating food.  Your bowel movements do not return to normal. SEEK IMMEDIATE MEDICAL CARE IF:   Your pain becomes worse.  Your symptoms do not get better.  Your symptoms suddenly get worse.  You have a fever.  You have repeated vomiting.  You have bloody or black, tarry stools. MAKE SURE YOU:   Understand these instructions.  Will watch your condition.  Will get help right away if you are not doing well or get worse.   This information is not intended to replace advice given to you by your health care provider. Make sure you discuss any questions you have with your health care  provider.   Document Released: 04/12/2005 Document Revised: 07/08/2013 Document Reviewed: 05/28/2013 Elsevier Interactive Patient Education [employer].

## 2015-09-07 ENCOUNTER — Ambulatory Visit: Payer: BC Managed Care – PPO | Admitting: Physical Therapy

## 2015-09-21 ENCOUNTER — Ambulatory Visit (INDEPENDENT_AMBULATORY_CARE_PROVIDER_SITE_OTHER): Payer: BC Managed Care – PPO | Admitting: Psychiatry

## 2015-09-21 ENCOUNTER — Encounter: Payer: Self-pay | Admitting: Psychiatry

## 2015-09-21 ENCOUNTER — Ambulatory Visit: Payer: BC Managed Care – PPO | Attending: Gastroenterology | Admitting: Physical Therapy

## 2015-09-21 VITALS — BP 102/69

## 2015-09-21 DIAGNOSIS — M629 Disorder of muscle, unspecified: Secondary | ICD-10-CM | POA: Insufficient documentation

## 2015-09-21 DIAGNOSIS — R2689 Other abnormalities of gait and mobility: Secondary | ICD-10-CM | POA: Diagnosis present

## 2015-09-21 DIAGNOSIS — R279 Unspecified lack of coordination: Secondary | ICD-10-CM | POA: Diagnosis present

## 2015-09-21 DIAGNOSIS — F331 Major depressive disorder, recurrent, moderate: Secondary | ICD-10-CM

## 2015-09-21 DIAGNOSIS — M6289 Other specified disorders of muscle: Secondary | ICD-10-CM

## 2015-09-21 DIAGNOSIS — F411 Generalized anxiety disorder: Secondary | ICD-10-CM

## 2015-09-21 MED ORDER — DESVENLAFAXINE SUCCINATE ER 100 MG PO TB24: 100.0000 mg | ORAL_TABLET | Freq: Every day | ORAL | Status: DC
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
## 2015-09-22 NOTE — Therapy (Signed)
[place] MAIN [facility] SERVICES [address] [place], [place], [address] Phone: [phone]   Fax:  [phone]  Physical Therapy Treatment  Patient Details  Name: Kaylee Gordon MRN: [id number] Date of Birth: 1961/05/23 Referring Provider: Rayann Heman  Encounter Date: 09/21/2015      PT End of Session - 09/22/15 2348    Visit Number 7   Number of Visits 12   PT Start Time 1320   PT Stop Time 1400   PT Time Calculation (min) 40 min   Activity Tolerance Patient tolerated treatment well;No increased pain   Behavior During Therapy [facility] for tasks assessed/performed      Past Medical History  Diagnosis Date  . Anxiety   . Depression   . Thyroid disease   . CHF (congestive heart failure) ([place])   . Cancer [facility])     SKIN CANCER  . Hypertension   . Asthma     Past Surgical History  Procedure Laterality Date  . Appendectomy    . Breast enhancement surgery    . Skin cancer excision    . Nasal sinus surgery    . Hemorrhoid surgery    . Colonoscopy with propofol N/A 02/22/2015    Procedure: COLONOSCOPY WITH PROPOFOL;  Surgeon: Josefine Class, MD;  Location: [facility] ENDOSCOPY;  Service: Endoscopy;  Laterality: N/A;  . Esophagogastroduodenoscopy (egd) with propofol N/A 02/22/2015    Procedure: ESOPHAGOGASTRODUODENOSCOPY (EGD) WITH PROPOFOL;  Surgeon: Josefine Class, MD;  Location: [facility] ENDOSCOPY;  Service: Endoscopy;  Laterality: N/A;  . Savory dilation N/A 02/22/2015    Procedure: SAVORY DILATION;  Surgeon: Josefine Class, MD;  Location: [facility] ENDOSCOPY;  Service: Endoscopy;  Laterality: N/A;  . Ankle surgery Right 2012    Filed Vitals:   09/21/15 1338  BP: 102/69    Visit Diagnosis:  Tight fascia  Lack of coordination  Poor balance  Decreased mobility      Subjective Assessment - 09/23/15 0009    Subjective Pt reported she went to the ED 09/02/14 for blood in her stool and was Dx with diverticulitis. Pt saw Dr. Rayann Heman who  treated her with new medications which has helped her diarrhea and he did not think her Sx was due to diverticulitis. 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  Pt has not been able to keep food down for the past 2-3 days. Pt reported that her dtr wants her to eat a meal with her but pt has not been able to.   Pt did  arrive to PT with a water bottle and a snack bar, showing compliance to PT recommendations from past visits. Pt apeared at the beginning PT session fatigued and reported weakness.  PT recommended her to eat her snack  bar. Pt ' reported feeling "better" and not as tired after eating snack bar.    Pertinent History Hx of two childbirths, (C-sections). appendectomy, Pt feels she does not have a routine and feels she does not eat well. Pt has not had a regular routine for the past 2 yrs since foot surgery 2012. Hx of R ankle surgery (ORIF) due to a fall 2012. Pt enjoyed Zumba, jog-walk prior to surgery.    Patient Stated Goals 1) improve balance 2) exercise that she enjoys and destressing relaxation techniques.             [facility] PT Assessment - 09/22/15 2345    Coordination   Coordination and Movement Description improved  exhalation but required use of toning to prevent abdominal straining. Guarded                      [facility] Adult PT Treatment/Exercise - 09/23/15 0003    Therapeutic Activites    ADL's educated about creating calming rotine prior to and after meals, create quite environment without TV or distractions, enjoy eating with dtr    Neuro Re-ed    Neuro Re-ed Details  guided pt on relaxation practice for prior /post meals: neck ROM,, pelvic tilt, knee ROM,  circular belly massage (colon massage) ,   toning vowels to faciliate complete exhalation    Manual Therapy   Other Manual Therapy abdominal massage                      PT Long Term Goals - 09/21/15 1329    PT LONG TERM GOAL #1   Title Pt will demo decreased scar restriction  over suprapubic area and below breasts in order to promote diaphragmatic breathing and pelvic floor movement.    Time 12   Period Weeks   Status Achieved   PT LONG TERM GOAL #2   Title Pt will decrease her ZUNG anxiety scale from 70% to < 50% in order to better manage IBS Sx.    Time 12   Period Weeks   Status On-going   PT LONG TERM GOAL #3   Title Pt will decrease her COREFO score from 34% to < 28% in order to show improved colorectal function.    Time 12   Period Weeks   Status On-going   PT LONG TERM GOAL #4   Title Pt will demo decreased DRA from 2 fingers to 1.5 fingers below sternum in order to improve intraabdominal pressure for digestive and cores tability functions.    Time 12   Period Weeks   Status Achieved   PT LONG TERM GOAL #5   Title Pt will demo log rolling technique across 2 visits to decrease straining of pelvic floor and abdominal muscles with bed mobility.    Time 12   Period Weeks   Status Achieved   Additional Long Term Goals   Additional Long Term Goals Yes   PT LONG TERM GOAL #6   Title Pt will demo no tenderness nor tensions in pelvic floor mm and normal pelvic floor ROM in order to improve QOL with urainry, sexual, and bowel function.    Time 12   Period Weeks   Status On-going   PT LONG TERM GOAL #7   Title Pt will demo no abdominal straining w/ exhalation in order to restore proper function of deep core mm to better manage stress and improve bowel function.   Time 12   Period Weeks   Status Achieved   PT LONG TERM GOAL #8   Title Pt will be IND with relatxation routine prior to and post eating in order to increase regularity of eating to decrease diarrhea and increase opportunities to dine with dtr.    Time 12   Period Weeks   Status New               Plan - 09/22/15 2349    Clinical Impression Statement Pt appeared fatigued and reported weakness upon arrival. BP was 102/69 mm Hg. Pt reported a stressful event over the weekend and was  not able to keep food down. Denied denied nausea/vomitting, nor blood in her urine/stools. Pt reported  feeling much better after being encouraged to eat her snack bar and receiving manual and neuromuscular education. Implimented toning of vowels to ensure completely smooth exhalation as pt originally demo'd abdominal straining in latter half of her exhalation. Guided pt through a relaxation routine  to practice prior to /after meals in order to promote parasympathetic response with eating. Upon questioning, pt reported her family ate meals with the TV on and individuals multitasking while eating. Pt showed compliant with previous recommendations: carry water bottle to ensure water intake and carry snack bar to ensure eating.  Pt will continue to require PT.    Pt will benefit from skilled therapeutic intervention in order to improve on the following deficits Abnormal gait;Increased muscle spasms;Decreased strength;Improper body mechanics;Postural dysfunction;Difficulty walking;Decreased activity tolerance;Decreased mobility;Decreased balance;Decreased endurance;Decreased scar mobility;Increased fascial restricitons;Decreased coordination;Decreased cognition;Hypomobility;Pain;Decreased range of motion  relaxation training   Rehab Potential Good   PT Frequency 1x / week   PT Duration 12 weeks   PT Treatment/Interventions ADLs/Self Care Home Management;Cryotherapy;Aquatic Therapy;Moist Heat;Traction;Electrical Stimulation;Patient/family education;Neuromuscular re-education;Balance training;Therapeutic exercise;Therapeutic activities;Functional mobility training;Stair training;Gait training;Manual techniques;Scar mobilization;Taping;Splinting;Passive range of motion;Other (comment)  relaxation training   Consulted and Agree with Plan of Care Patient        Problem List Patient Active Problem List   Diagnosis Date Noted  . Benign essential HTN 06/22/2015  . Combined fat and carbohydrate induced  hyperlipemia 06/22/2015  . Breathlessness on exertion 06/17/2015  . Pure hypercholesterolemia 04/22/2015  . Congestive heart failure with left ventricular systolic dysfunction ([place]) 03/24/2015  . Acquired hypothyroidism 02/04/2015  . Heart failure, systolic ([place]) 123456  . Anxiety 12/28/2014  . Allergic rhinitis 10/27/2014  . Bipolar 1 disorder, depressed ([place]) 10/27/2014  . Hypercholesterolemia without hypertriglyceridemia 10/27/2014  . Depression, major, recurrent, moderate ([place]) 10/27/2014  . Adult hypothyroidism 10/27/2014  . Anxiety, generalized 10/27/2014  . Essential (primary) hypertension 10/27/2014  . Colon polyp 10/27/2014  . Hypoparathyroidism ([place]) 02/14/2013    Jerl Mina ,PT, DPT, E-RYT  09/23/2015, 12:09 AM  [place] MAIN [facility] SERVICES [address] [place], [place], [address] Phone: [phone]   Fax:  [phone]  Name: SEARRA LEY MRN: [id number] Date of Birth: 11-Feb-1961

## 2015-09-27 DIAGNOSIS — I471 Supraventricular tachycardia, unspecified: Secondary | ICD-10-CM | POA: Insufficient documentation

## 2015-10-04 ENCOUNTER — Ambulatory Visit: Payer: BC Managed Care – PPO | Admitting: Physical Therapy

## 2015-10-07 ENCOUNTER — Encounter: Payer: Self-pay | Admitting: Psychiatry

## 2015-10-07 ENCOUNTER — Ambulatory Visit (INDEPENDENT_AMBULATORY_CARE_PROVIDER_SITE_OTHER): Payer: BC Managed Care – PPO | Admitting: Psychiatry

## 2015-10-07 VITALS — BP 122/82 | HR 124 | Temp 97.9°F | Ht 64.0 in | Wt 155.8 lb

## 2015-10-07 DIAGNOSIS — F411 Generalized anxiety disorder: Secondary | ICD-10-CM | POA: Diagnosis not present

## 2015-10-07 DIAGNOSIS — F331 Major depressive disorder, recurrent, moderate: Secondary | ICD-10-CM

## 2015-10-07 NOTE — Progress Notes (Signed)
Patient ID: Kaylee Gordon, female   DOB: Oct 24, 1960, 55 y.o.   MRN: [id number]  [facility] MD/PA/NP OP Progress Note  10/07/2015 2:09 PM Kaylee Gordon  MRN:  [id number]  Subjective:  Patient returns for follow-up of anxiety and major depression. She reports that the increase in Pristiq to 100mg  has been helpful in reducing her lethargy, easier to get up in the mornings. Her anxiety is better. She states that she was able to decrease the Seroquel 200 mg without any problems. States that she is sleeping 6-7 hours per night but states it's okay. Overall she is doing better. States she got the flu recently and is tired from that. She was able to decrease the Valium to 10 mg daily. She is agreeable to tapering it off further.   Chief Complaint: doing better Chief Complaint    Follow-up; Medication Refill     Visit Diagnosis:   No diagnosis found.  Past Medical History:  Past Medical History  Diagnosis Date  . Anxiety   . Depression   . Thyroid disease   . CHF (congestive heart failure) ([place])   . Cancer [facility])     SKIN CANCER  . Hypertension   . Asthma     Past Surgical History  Procedure Laterality Date  . Appendectomy    . Breast enhancement surgery    . Skin cancer excision    . Nasal sinus surgery    . Hemorrhoid surgery    . Colonoscopy with propofol N/A 02/22/2015    Procedure: COLONOSCOPY WITH PROPOFOL;  Surgeon: Josefine Class, MD;  Location: [facility] ENDOSCOPY;  Service: Endoscopy;  Laterality: N/A;  . Esophagogastroduodenoscopy (egd) with propofol N/A 02/22/2015    Procedure: ESOPHAGOGASTRODUODENOSCOPY (EGD) WITH PROPOFOL;  Surgeon: Josefine Class, MD;  Location: [facility] ENDOSCOPY;  Service: Endoscopy;  Laterality: N/A;  . Savory dilation N/A 02/22/2015    Procedure: SAVORY DILATION;  Surgeon: Josefine Class, MD;  Location: [facility] ENDOSCOPY;  Service: Endoscopy;  Laterality: N/A;  . Ankle surgery Right 2012   Family History:  Family History  Problem Relation Age of Onset   . Cancer Mother   . Depression Mother   . Anxiety disorder Mother   . Heart disease Father   . Cancer Father   . Anxiety disorder Father   . Depression Father   . Hypoparathyroidism Son    Social History:  Social History   Social History  . Marital Status: Married    Spouse Name: N/A  . Number of Children: N/A  . Years of Education: N/A   Social History Main Topics  . Smoking status: Never Smoker   . Smokeless tobacco: Never Used  . Alcohol Use: 0.0 oz/week    0 Standard drinks or equivalent per week     Comment: MAYBE ONCE OR TWICE A MONTH  . Drug Use: No  . Sexual Activity: No   Other Topics Concern  . None   Social History Narrative   Additional History:   Assessment:  Patient thanked Probation officer for trying to help her and gave Probation officer an unsolicited hug. Musculoskeletal: Strength & Muscle Tone: within normal limits Gait & Station: normal Patient leans: N/A  Psychiatric Specialty Exam: Depression        Associated symptoms include does not have insomnia and no suicidal ideas.  Past medical history includes anxiety.   Anxiety Symptoms include nervous/anxious behavior. Patient reports no insomnia or suicidal ideas.      Review of Systems  Psychiatric/Behavioral: Negative for  depression, suicidal ideas, hallucinations, memory loss and substance abuse. The patient is nervous/anxious. The patient does not have insomnia.   All other systems reviewed and are negative.   Blood pressure 122/82, pulse 124, temperature 97.9 F (36.6 C), temperature source Tympanic, height 5\' 4"  (1.626 m), weight 155 lb 12.8 oz (70.67 kg), SpO2 97 %.Body mass index is 26.73 kg/(m^2).  General Appearance: Neat and Well Groomed  Eye Contact:  Good  Speech:  Clear and Coherent and Normal Rate  Volume:  Normal  Mood:  good  Affect:  Anxious   Thought Process:  Circumstantial  Orientation:  Full (Time, Place, and Person)  Thought Content:  Negative  Suicidal Thoughts:  No  Homicidal  Thoughts:  No  Memory:  Immediate;   Good Recent;   Good Remote;   Good  Judgement:  Good  Insight:  Good  Psychomotor Activity:  Negative and Normal  Concentration:  Good  Recall:  Good  Fund of Knowledge: Good  Language: Good  Akathisia:  Negative  Handed:  Right  AIMS (if indicated):  Not done  Assets:  Communication Skills Desire for Improvement Vocational/Educational  ADL's:  Intact  Cognition: WNL  Sleep:  Sleeping more on weekends    Is the patient at risk to self?  No. Has the patient been a risk to self in the past 6 months?  No. Has the patient been a risk to self within the distant past?  No. Is the patient a risk to others?  No. Has the patient been a risk to others in the past 6 months?  No. Has the patient been a risk to others within the distant past?  No.  Current Medications: Current Outpatient Prescriptions  Medication Sig Dispense Refill  . albuterol (PROVENTIL, VENTOLIN) (5 MG/ML) 0.5% NEBU Inhale into the lungs. Reported on 07/07/2015    . calcitRIOL (ROCALTROL) 0.25 MCG capsule TAKE 1 CAPSULE BY MOUTH DAILY. 30 capsule 0  . celecoxib (CELEBREX) 200 MG capsule Take 200 mg by mouth 2 (two) times daily.    Marland Kitchen desvenlafaxine (PRISTIQ) 100 MG 24 hr tablet Take 1 tablet (100 mg total) by mouth daily. 30 tablet 0  . diazepam (VALIUM) 10 MG tablet Take 1 tablet (10 mg total) by mouth 2 (two) times daily. 60 tablet 4  . dicyclomine (BENTYL) 10 MG capsule Take by mouth.    . esomeprazole (NEXIUM) 40 MG capsule Take by mouth.    . fluticasone (FLONASE) 50 MCG/ACT nasal spray     . levothyroxine (SYNTHROID, LEVOTHROID) 50 MCG tablet Take by mouth.    Marland Kitchen lisinopril (PRINIVIL,ZESTRIL) 5 MG tablet TAKE 1 TABLET BY MOUTH ONCE A DAY    . lovastatin (MEVACOR) 20 MG tablet Take 20 mg by mouth at bedtime.    Marland Kitchen omeprazole (PRILOSEC) 40 MG capsule     . PRISTIQ 50 MG 24 hr tablet TAKE 1 TABLET (50 MG TOTAL) BY MOUTH EVERY MORNING. 30 tablet 4  . PROVENTIL HFA 108 (90 BASE)  MCG/ACT inhaler     . QUEtiapine (SEROQUEL) 200 MG tablet Take 1 tablet (200 mg total) by mouth at bedtime. 30 tablet 4  . ZIANA gel APPLY TO AFFECTED AREA ON THE SKIN AT BEDTIME  3  . ZOSTAVAX 60454 UNT/0.65ML injection      No current facility-administered medications for this visit.    Medical Decision Making:  Established Problem, Stable/Improving (1)  Treatment Plan Summary:Medication management Major depressive disorder- continue Pristiq to 100 mg in the morning.  Continue  Seroquel  100 mg at bedtime.   Generalized anxiety disorder-  Patient to continue therapy.  She'll follow-up in 2 months.  She's been encouraged to call with questions concerns prior to her next appointment.  Jackquline Branca 10/07/2015, 2:09 PM

## 2015-10-18 ENCOUNTER — Ambulatory Visit: Payer: BC Managed Care – PPO | Attending: Gastroenterology | Admitting: Physical Therapy

## 2015-10-18 DIAGNOSIS — R2689 Other abnormalities of gait and mobility: Secondary | ICD-10-CM | POA: Insufficient documentation

## 2015-10-18 DIAGNOSIS — M6289 Other specified disorders of muscle: Secondary | ICD-10-CM

## 2015-10-18 DIAGNOSIS — M629 Disorder of muscle, unspecified: Secondary | ICD-10-CM | POA: Diagnosis present

## 2015-10-18 DIAGNOSIS — R279 Unspecified lack of coordination: Secondary | ICD-10-CM | POA: Insufficient documentation

## 2015-10-19 NOTE — Therapy (Addendum)
[place] MAIN [facility] SERVICES [address] [place], [place], [address] Phone: [phone]   Fax:  [phone]  Physical Therapy Treatment  Patient Details  Name: Kaylee Gordon MRN: [id number] Date of Birth: 1961-04-08 Referring Provider: Rayann Heman  Encounter Date: 10/18/2015      PT End of Session - 10/19/15 2302    Visit Number 8   Number of Visits 12   PT Start Time [id number]   PT Stop Time 1400   PT Time Calculation (min) 46 min   Activity Tolerance Patient tolerated treatment well   Behavior During Therapy [facility] for tasks assessed/performed      Past Medical History  Diagnosis Date  . Anxiety   . Depression   . Thyroid disease   . CHF (congestive heart failure) ([place])   . Cancer [facility])     SKIN CANCER  . Hypertension   . Asthma     Past Surgical History  Procedure Laterality Date  . Appendectomy    . Breast enhancement surgery    . Skin cancer excision    . Nasal sinus surgery    . Hemorrhoid surgery    . Colonoscopy with propofol N/A 02/22/2015    Procedure: COLONOSCOPY WITH PROPOFOL;  Surgeon: Josefine Class, MD;  Location: [facility] ENDOSCOPY;  Service: Endoscopy;  Laterality: N/A;  . Esophagogastroduodenoscopy (egd) with propofol N/A 02/22/2015    Procedure: ESOPHAGOGASTRODUODENOSCOPY (EGD) WITH PROPOFOL;  Surgeon: Josefine Class, MD;  Location: [facility] ENDOSCOPY;  Service: Endoscopy;  Laterality: N/A;  . Savory dilation N/A 02/22/2015    Procedure: SAVORY DILATION;  Surgeon: Josefine Class, MD;  Location: [facility] ENDOSCOPY;  Service: Endoscopy;  Laterality: N/A;  . Ankle surgery Right 2012    There were no vitals filed for this visit.  Visit Diagnosis:  Tight fascia - Plan: PT plan of care cert/re-cert  Lack of coordination - Plan: PT plan of care cert/re-cert  Poor balance - Plan: PT plan of care cert/re-cert  Decreased mobility - Plan: PT plan of care cert/re-cert      Subjective Assessment - 10/19/15 2255    Subjective Pt reports she has not had diarrhea for the past few weeks. Pt recofered from the flue and no longer has vomitting. Pt has 3-4x bowel movements a day. [place] Stool Type 4-5. Pt has been trying to eat more. Pt reports having anxiety related to intimacy with her husband and feels her body and pelvic floor tighten. Pt expressed she would like a referral for sexual counselling. Pt was able to hike with husband for 2 miles and is starting to walk more for exercise.     Pertinent History Hx of two childbirths, (C-sections). appendectomy, Pt feels she does not have a routine and feels she does not eat well. Pt has not had a regular routine for the past 2 yrs since foot surgery 2012. Hx of R ankle surgery (ORIF) due to a fall 2012. Pt enjoyed Zumba, jog-walk prior to surgery.    Patient Stated Goals 1) improve balance 2) exercise that she enjoys and destressing relaxation techniques.             [facility] PT Assessment - 10/19/15 2255    Observation/Other Assessments   Observations pt wears high heels to every visits, pt has brighter and calmer affect.     Strength   Overall Strength Comments 3+/5 bilaterally, ankle eversion, plantarflexion, supination 5/5 bilaterally,  hip abd   Ambulation/Gait   Gait Comments pronation of  ankle B, genu vagus      Zung Anxiety Scale: 705 (SOC )---> 54% ( today) COREFO 34% (SOC) --> 28% (today)             Pelvic Floor Special Questions - 10/19/15 2257    Pelvic Floor Internal Exam pt consented verbally without contraindications   Exam Type Vaginal   Palpation no tenderness of pelvic floor mm   slightly overactive pelvic floor    Biofeedback required cuing for diaphragmatic breathing to lengthen pelvic floor            [facility] Adult PT Treatment/Exercise - 10/19/15 2255    Therapeutic Activites    ADL's warrior II, mountain pose transitions and lower kinetic chain activation to pelvis   heel raises 10 x   Neuro Re-ed    Neuro Re-ed Details   pelvic floor lengthening with diaphramgtic breath, lifting of foot arches in warrior II and mountain pose  cued for depression of upper traps                PT Education - 10/19/15 2300    Education provided Yes   Education Details HEP, sexual counselor referral   Person(s) Educated Patient   Methods Explanation;Demonstration;Tactile cues;Verbal cues   Comprehension Returned demonstration;Verbalized understanding             PT Long Term Goals - 10/19/15 2303    PT LONG TERM GOAL #1   Title Pt will demo decreased scar restriction over suprapubic area and below breasts in order to promote diaphragmatic breathing and pelvic floor movement.    Time 12   Period Weeks   Status Achieved   PT LONG TERM GOAL #2   Title Pt will decrease her ZUNG anxiety scale from 70% to < 50% in order to better manage IBS Sx. (4/5: 54%)    Time 12   Period Weeks   Status On-going   PT LONG TERM GOAL #3   Title Pt will decrease her COREFO score from 34% to < 28% in order to show improved colorectal function.  (10/17/15: 28%)    Time 12   Period Weeks   Status Achieved   PT LONG TERM GOAL #4   Title Pt will demo decreased DRA from 2 fingers to 1.5 fingers below sternum in order to improve intraabdominal pressure for digestive and cores tability functions.    Time 12   Period Weeks   Status Achieved   PT LONG TERM GOAL #5   Title Pt will demo log rolling technique across 2 visits to decrease straining of pelvic floor and abdominal muscles with bed mobility.    Time 12   Period Weeks   Status Achieved   Additional Long Term Goals   Additional Long Term Goals Yes   PT LONG TERM GOAL #6   Title Pt will demo no tenderness nor tensions in pelvic floor mm and normal pelvic floor ROM in order to improve QOL with urainry, sexual, and bowel function.    Time 12   Period Weeks   Status Achieved   PT LONG TERM GOAL #7   Title Pt will demo no abdominal straining w/ exhalation in order to restore  proper function of deep core mm to better manage stress and improve bowel function.   Time 12   Period Weeks   Status Achieved   PT LONG TERM GOAL #8   Title Pt will be IND with relaxation routine prior to and post eating in order  to increase regularity of eating to decrease diarrhea and increase opportunities to dine with dtr.    Time 12   Period Weeks   Status Achieved   PT LONG TERM GOAL  #9   TITLE Pt will demonstrate lifted arches bilaterally with gait without verbal cuing in order to return to exercising and walking for health and wellness.    Time 12   Period Weeks   Status New               Plan - 10/19/15 2307    Clinical Impression Statement Pt has achieved 6/9 goals and has reported decreased diarrhea. Pt is trying to increase her food intake with healthier choices.  Pt has shown improvements with decreased abdominal separation, significantly decreased pelvic floor mm tensions, and improved deep core coordination.  Pt is progressing well towards her remaining goals related to return to fitness routines.  However, pt will benefit from balance and propioception training to  decrease her risk for falls given her Hx of falls, ankle surgery, and gait deviations. Pt will benefit continued PT to achieve her remaining goals. A re-cert accompanies this note requesting for more visits.    Pt will benefit from skilled therapeutic intervention in order to improve on the following deficits Abnormal gait;Increased muscle spasms;Decreased strength;Improper body mechanics;Postural dysfunction;Difficulty walking;Decreased activity tolerance;Decreased mobility;Decreased balance;Decreased endurance;Decreased scar mobility;Increased fascial restricitons;Decreased coordination;Decreased cognition;Hypomobility;Pain;Decreased range of motion  relaxation training   Rehab Potential Good   PT Frequency 1x / week   PT Duration 12 weeks   PT Treatment/Interventions ADLs/Self Care Home  Management;Cryotherapy;Aquatic Therapy;Moist Heat;Traction;Electrical Stimulation;Patient/family education;Neuromuscular re-education;Balance training;Therapeutic exercise;Therapeutic activities;Functional mobility training;Stair training;Gait training;Manual techniques;Scar mobilization;Taping;Splinting;Passive range of motion;Other (comment)  relaxation training   Consulted and Agree with Plan of Care Patient        Problem List Patient Active Problem List   Diagnosis Date Noted  . Paroxysmal supraventricular tachycardia ([place]) 09/27/2015  . Benign essential HTN 06/22/2015  . Combined fat and carbohydrate induced hyperlipemia 06/22/2015  . Breathlessness on exertion 06/17/2015  . Pure hypercholesterolemia 04/22/2015  . Congestive heart failure with left ventricular systolic dysfunction ([place]) 03/24/2015  . Acquired hypothyroidism 02/04/2015  . Heart failure, systolic ([place]) 123456  . Anxiety 12/28/2014  . Allergic rhinitis 10/27/2014  . Bipolar 1 disorder, depressed ([place]) 10/27/2014  . Hypercholesterolemia without hypertriglyceridemia 10/27/2014  . Depression, major, recurrent, moderate ([place]) 10/27/2014  . Adult hypothyroidism 10/27/2014  . Anxiety, generalized 10/27/2014  . Essential (primary) hypertension 10/27/2014  . Colon polyp 10/27/2014  . Hypoparathyroidism ([place]) 02/14/2013    Jerl Mina ,PT, DPT, E-RYT  10/20/2015, 8:36 AM  [place] MAIN [facility] SERVICES [address] [place], [place], [address] Phone: [phone]   Fax:  [phone]  Name: Kaylee Gordon MRN: [id number] Date of Birth: [birth year]

## 2015-10-19 NOTE — Patient Instructions (Addendum)
Wear tennis shoes to next visit  Warrior II and mountain pose with lifting of foot arches    Pelvic floor lengthening with diaphragmatic breath   Heel raises with finger tip support on wall 10 x 3 sets

## 2015-10-29 ENCOUNTER — Other Ambulatory Visit: Payer: Self-pay | Admitting: Endocrinology

## 2015-11-23 ENCOUNTER — Encounter: Payer: BC Managed Care – PPO | Admitting: Physical Therapy

## 2015-12-06 ENCOUNTER — Ambulatory Visit: Payer: BC Managed Care – PPO | Admitting: Physical Therapy

## 2015-12-07 ENCOUNTER — Ambulatory Visit: Payer: BC Managed Care – PPO | Admitting: Psychiatry

## 2015-12-08 ENCOUNTER — Encounter: Payer: Self-pay | Admitting: Psychiatry

## 2015-12-08 ENCOUNTER — Ambulatory Visit (INDEPENDENT_AMBULATORY_CARE_PROVIDER_SITE_OTHER): Payer: BC Managed Care – PPO | Admitting: Psychiatry

## 2015-12-08 VITALS — BP 110/86 | HR 119 | Temp 98.6°F | Ht 64.0 in | Wt 152.4 lb

## 2015-12-08 DIAGNOSIS — F331 Major depressive disorder, recurrent, moderate: Secondary | ICD-10-CM | POA: Diagnosis not present

## 2015-12-08 DIAGNOSIS — F411 Generalized anxiety disorder: Secondary | ICD-10-CM

## 2015-12-08 MED ORDER — QUETIAPINE FUMARATE 100 MG PO TABS: 100.0000 mg | ORAL_TABLET | Freq: Every day | ORAL | Status: DC

## 2015-12-08 MED ORDER — DESVENLAFAXINE SUCCINATE ER 50 MG PO TB24: 150.0000 mg | ORAL_TABLET | Freq: Every day | ORAL | Status: DC

## 2015-12-08 MED ORDER — HYDROXYZINE PAMOATE 25 MG PO CAPS: 25.0000 mg | ORAL_CAPSULE | Freq: Two times a day (BID) | ORAL | Status: DC | PRN

## 2015-12-08 MED ORDER — QUETIAPINE FUMARATE 25 MG PO TABS: 25.0000 mg | ORAL_TABLET | Freq: Two times a day (BID) | ORAL | Status: DC

## 2015-12-08 NOTE — Progress Notes (Signed)
Patient ID: Kaylee Gordon, female   DOB: 09/08/1960, 55 y.o.   MRN: [id number]   [facility] MD/PA/NP OP Progress Note  12/08/2015 1:19 PM Vernadette DESERI SACHDEV  MRN:  [id number]  Subjective:  Patient returns for follow-up of anxiety and major depression. She reports being depressed and anxious over the past month. States her son has graduated from college past week and daughter is graduatuing from high school. She reports feeling bereft. Patient very tearful in the session and states that she feels that there is no purpose in her life. States that she is not suicidal and it's just that her children have been her life and though she is happy that they're graduating she is going to miss them. Disturbed sleeping. She's been eating okay. Patient also worried about how her relationship with her husband would be once the children leave the home. She has been seeing Otila Kluver on a weekly basis for therapy.  Chief Complaint: doing better Chief Complaint    Follow-up; Medication Refill; Anxiety; Depression; Insomnia     Visit Diagnosis:     ICD-9-CM ICD-10-CM   1. Major depressive disorder, recurrent episode, moderate (HCC) 296.32 F33.1   2. GAD (generalized anxiety disorder) 300.02 F41.1     Past Medical History:  Past Medical History  Diagnosis Date  . Anxiety   . Depression   . Thyroid disease   . CHF (congestive heart failure) ([place])   . Cancer [facility])     SKIN CANCER  . Hypertension   . Asthma     Past Surgical History  Procedure Laterality Date  . Appendectomy    . Breast enhancement surgery    . Skin cancer excision    . Nasal sinus surgery    . Hemorrhoid surgery    . Colonoscopy with propofol N/A 02/22/2015    Procedure: COLONOSCOPY WITH PROPOFOL;  Surgeon: Josefine Class, MD;  Location: [facility] ENDOSCOPY;  Service: Endoscopy;  Laterality: N/A;  . Esophagogastroduodenoscopy (egd) with propofol N/A 02/22/2015    Procedure: ESOPHAGOGASTRODUODENOSCOPY (EGD) WITH PROPOFOL;  Surgeon: Josefine Class, MD;  Location: [facility] ENDOSCOPY;  Service: Endoscopy;  Laterality: N/A;  . Savory dilation N/A 02/22/2015    Procedure: SAVORY DILATION;  Surgeon: Josefine Class, MD;  Location: [facility] ENDOSCOPY;  Service: Endoscopy;  Laterality: N/A;  . Ankle surgery Right 2012   Family History:  Family History  Problem Relation Age of Onset  . Cancer Mother   . Depression Mother   . Anxiety disorder Mother   . Heart disease Father   . Cancer Father   . Anxiety disorder Father   . Depression Father   . Hypoparathyroidism Son    Social History:  Social History   Social History  . Marital Status: Married    Spouse Name: N/A  . Number of Children: N/A  . Years of Education: N/A   Social History Main Topics  . Smoking status: Never Smoker   . Smokeless tobacco: Never Used  . Alcohol Use: 0.0 oz/week    0 Standard drinks or equivalent per week     Comment: MAYBE ONCE OR TWICE A MONTH  . Drug Use: No  . Sexual Activity: No   Other Topics Concern  . None   Social History Narrative   Additional History:   Assessment:  Patient thanked Probation officer for trying to help her and gave Probation officer an unsolicited hug. Musculoskeletal: Strength & Muscle Tone: within normal limits Gait & Station: normal Patient leans: N/A  Psychiatric Specialty  Exam: Anxiety Symptoms include insomnia and nervous/anxious behavior. Patient reports no suicidal ideas.    Depression        Associated symptoms include insomnia.  Associated symptoms include no suicidal ideas.  Past medical history includes anxiety.   Insomnia PMH includes: depression.    Review of Systems  Psychiatric/Behavioral: Positive for depression. Negative for suicidal ideas, hallucinations, memory loss and substance abuse. The patient is nervous/anxious and has insomnia.   All other systems reviewed and are negative.   Blood pressure 110/86, pulse 119, temperature 98.6 F (37 C), temperature source Tympanic, height 5\' 4"  (1.626 m), weight 152  lb 6.4 oz (69.128 kg), SpO2 98 %.Body mass index is 26.15 kg/(m^2).  General Appearance: Neat and Well Groomed  Eye Contact:  Good  Speech:  Clear and Coherent and Normal Rate  Volume:  Normal  Mood:  depressed  Affect:  Anxious   Thought Process:  Circumstantial  Orientation:  Full (Time, Place, and Person)  Thought Content:  Negative  Suicidal Thoughts:  No  Homicidal Thoughts:  No  Memory:  Immediate;   Good Recent;   Good Remote;   Good  Judgement:  Good  Insight:  Good  Psychomotor Activity:  Negative and Normal  Concentration:  Good  Recall:  Good  Fund of Knowledge: Good  Language: Good  Akathisia:  Negative  Handed:  Right  AIMS (if indicated):  Not done  Assets:  Communication Skills Desire for Improvement Vocational/Educational  ADL's:  Intact  Cognition: WNL  Sleep:  Sleeping more on weekends    Is the patient at risk to self?  No. Has the patient been a risk to self in the past 6 months?  No. Has the patient been a risk to self within the distant past?  No. Is the patient a risk to others?  No. Has the patient been a risk to others in the past 6 months?  No. Has the patient been a risk to others within the distant past?  No.  Current Medications: Current Outpatient Prescriptions  Medication Sig Dispense Refill  . albuterol (PROVENTIL, VENTOLIN) (5 MG/ML) 0.5% NEBU Inhale into the lungs. Reported on 07/07/2015    . calcitRIOL (ROCALTROL) 0.25 MCG capsule TAKE 1 CAPSULE BY MOUTH DAILY. 30 capsule 0  . calcitRIOL (ROCALTROL) 0.25 MCG capsule TAKE 1 CAPSULE BY MOUTH DAILY. 30 capsule 3  . celecoxib (CELEBREX) 200 MG capsule Take 200 mg by mouth 2 (two) times daily.    Marland Kitchen desvenlafaxine (PRISTIQ) 100 MG 24 hr tablet Take 1 tablet (100 mg total) by mouth daily. 30 tablet 0  . diazepam (VALIUM) 10 MG tablet Take 1 tablet (10 mg total) by mouth 2 (two) times daily. (Patient taking differently: Take 5 mg by mouth 2 (two) times daily. ) 60 tablet 4  . dicyclomine  (BENTYL) 10 MG capsule Take by mouth.    . esomeprazole (NEXIUM) 40 MG capsule Take by mouth.    . fluticasone (FLONASE) 50 MCG/ACT nasal spray     . levothyroxine (SYNTHROID, LEVOTHROID) 50 MCG tablet Take by mouth.    Marland Kitchen lisinopril (PRINIVIL,ZESTRIL) 5 MG tablet TAKE 1 TABLET BY MOUTH ONCE A DAY    . lovastatin (MEVACOR) 20 MG tablet Take 20 mg by mouth at bedtime.    Marland Kitchen omeprazole (PRILOSEC) 40 MG capsule     . PRISTIQ 50 MG 24 hr tablet TAKE 1 TABLET (50 MG TOTAL) BY MOUTH EVERY MORNING. 30 tablet 4  . PROVENTIL HFA 108 (90 BASE)  MCG/ACT inhaler     . QUEtiapine (SEROQUEL) 200 MG tablet Take 1 tablet (200 mg total) by mouth at bedtime. (Patient taking differently: Take 100 mg by mouth at bedtime. ) 30 tablet 4  . ZIANA gel APPLY TO AFFECTED AREA ON THE SKIN AT BEDTIME  3  . ZOSTAVAX 16109 UNT/0.65ML injection      No current facility-administered medications for this visit.    Medical Decision Making:  Established Problem, Stable/Improving (1)  Treatment Plan Summary:Medication management Major depressive disorder- Increase  Pristiq to 150 mg in the morning.   Continue  Seroquel  100 mg at bedtime.   Generalized anxiety disorder- Start Seroquel at 25 mg by mouth twice a day Patient to continue therapy.  She'll follow-up in 2 weeks.  She's been encouraged to call with questions concerns prior to her next appointment.  AmeLie Hollars 12/08/2015, 1:19 PM

## 2015-12-20 ENCOUNTER — Encounter: Payer: BC Managed Care – PPO | Admitting: Physical Therapy

## 2015-12-22 ENCOUNTER — Ambulatory Visit: Payer: BC Managed Care – PPO | Admitting: Psychiatry

## 2015-12-27 ENCOUNTER — Ambulatory Visit (INDEPENDENT_AMBULATORY_CARE_PROVIDER_SITE_OTHER): Payer: BC Managed Care – PPO | Admitting: Psychiatry

## 2015-12-27 DIAGNOSIS — F331 Major depressive disorder, recurrent, moderate: Secondary | ICD-10-CM | POA: Diagnosis not present

## 2015-12-27 DIAGNOSIS — F411 Generalized anxiety disorder: Secondary | ICD-10-CM

## 2015-12-27 NOTE — Progress Notes (Signed)
Patient ID: Kaylee Gordon, female   DOB: 04/02/1961, 55 y.o.   MRN: [id number]    [facility] MD/PA/NP OP Progress Note  12/27/2015 3:03 PM Kaylee Gordon  MRN:  [id number]  Subjective:  Patient returns for follow-up of anxiety and major depression. She reports improved mood mostly. States she has trouble going to bed , once she sleeps she is able to stay asleep. 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  She's been eating okay. States that she has been taking the Seroquel 50 mg in the morning. Doing well on the increased dose of the Pristiq. Denies any side effects from the increased doses. States that she has very good children and is thankful for that. However she reports that she does not have a good relationship with her husband. States he does not want to do anything with the family. States that he does not enjoy the children as well. She reports being nervous around her husband. States she is trying hard to see things in a positive light. She has been seeing Otila Kluver on a weekly basis for therapy.  Chief Complaint: doing better  Visit Diagnosis:     ICD-9-CM ICD-10-CM   1. Major depressive disorder, recurrent episode, moderate (HCC) 296.32 F33.1   2. GAD (generalized anxiety disorder) 300.02 F41.1     Past Medical History:  Past Medical History  Diagnosis Date  . Anxiety   . Depression   . Thyroid disease   . CHF (congestive heart failure) ([place])   . Cancer [facility])     SKIN CANCER  . Hypertension   . Asthma     Past Surgical History  Procedure Laterality Date  . Appendectomy    . Breast enhancement surgery    . Skin cancer excision    . Nasal sinus surgery    . Hemorrhoid surgery    . Colonoscopy with propofol N/A 02/22/2015    Procedure: COLONOSCOPY WITH PROPOFOL;  Surgeon: Josefine Class, MD;  Location: [facility] ENDOSCOPY;  Service: Endoscopy;  Laterality: N/A;  . Esophagogastroduodenoscopy (egd) with propofol N/A 02/22/2015    Procedure: ESOPHAGOGASTRODUODENOSCOPY (EGD)  WITH PROPOFOL;  Surgeon: Josefine Class, MD;  Location: [facility] ENDOSCOPY;  Service: Endoscopy;  Laterality: N/A;  . Savory dilation N/A 02/22/2015    Procedure: SAVORY DILATION;  Surgeon: Josefine Class, MD;  Location: [facility] ENDOSCOPY;  Service: Endoscopy;  Laterality: N/A;  . Ankle surgery Right 2012   Family History:  Family History  Problem Relation Age of Onset  . Cancer Mother   . Depression Mother   . Anxiety disorder Mother   . Heart disease Father   . Cancer Father   . Anxiety disorder Father   . Depression Father   . Hypoparathyroidism Son    Social History:  Social History   Social History  . Marital Status: Married    Spouse Name: N/A  . Number of Children: N/A  . Years of Education: N/A   Social History Main Topics  . Smoking status: Never Smoker   . Smokeless tobacco: Never Used  . Alcohol Use: 0.0 oz/week    0 Standard drinks or equivalent per week     Comment: MAYBE ONCE OR TWICE A MONTH  . Drug Use: No  . Sexual Activity: No   Other Topics Concern  . Not on file   Social History Narrative   Additional History:   Musculoskeletal: Strength & Muscle Tone: within normal limits Gait & Station: normal Patient leans: N/A  Psychiatric Specialty Exam: Anxiety Patient reports no insomnia, nervous/anxious behavior or suicidal ideas.    Depression        Associated symptoms include does not have insomnia and no suicidal ideas.  Past medical history includes anxiety.   Insomnia PMH includes: no depression.    Review of Systems  Psychiatric/Behavioral: Negative for depression, suicidal ideas, hallucinations, memory loss and substance abuse. The patient is not nervous/anxious and does not have insomnia.   All other systems reviewed and are negative.   There were no vitals taken for this visit.There is no weight on file to calculate BMI.  General Appearance: Neat and Well Groomed  Eye Contact:  Good  Speech:  Clear and Coherent and Normal Rate   Volume:  Normal  Mood:  Improved significantly  Affect:  improved  Thought Process:  Circumstantial  Orientation:  Full (Time, Place, and Person)  Thought Content:  Negative  Suicidal Thoughts:  No  Homicidal Thoughts:  No  Memory:  Immediate;   Good Recent;   Good Remote;   Good  Judgement:  Good  Insight:  Good  Psychomotor Activity:  Negative and Normal  Concentration:  Good  Recall:  Good  Fund of Knowledge: Good  Language: Good  Akathisia:  Negative  Handed:  Right  AIMS (if indicated):  Not done  Assets:  Communication Skills Desire for Improvement Vocational/Educational  ADL's:  Intact  Cognition: WNL  Sleep:  Sleeping more on weekends    Is the patient at risk to self?  No. Has the patient been a risk to self in the past 6 months?  No. Has the patient been a risk to self within the distant past?  No. Is the patient a risk to others?  No. Has the patient been a risk to others in the past 6 months?  No. Has the patient been a risk to others within the distant past?  No.  Current Medications: Current Outpatient Prescriptions  Medication Sig Dispense Refill  . albuterol (PROVENTIL, VENTOLIN) (5 MG/ML) 0.5% NEBU Inhale into the lungs. Reported on 07/07/2015    . calcitRIOL (ROCALTROL) 0.25 MCG capsule TAKE 1 CAPSULE BY MOUTH DAILY. 30 capsule 0  . calcitRIOL (ROCALTROL) 0.25 MCG capsule TAKE 1 CAPSULE BY MOUTH DAILY. 30 capsule 3  . celecoxib (CELEBREX) 200 MG capsule Take 200 mg by mouth 2 (two) times daily.    Marland Kitchen desvenlafaxine (PRISTIQ) 50 MG 24 hr tablet Take 3 tablets (150 mg total) by mouth daily. 90 tablet 1  . dicyclomine (BENTYL) 10 MG capsule Take by mouth.    . esomeprazole (NEXIUM) 40 MG capsule Take by mouth.    . fluticasone (FLONASE) 50 MCG/ACT nasal spray     . levothyroxine (SYNTHROID, LEVOTHROID) 50 MCG tablet Take by mouth.    Marland Kitchen lisinopril (PRINIVIL,ZESTRIL) 5 MG tablet TAKE 1 TABLET BY MOUTH ONCE A DAY    . lovastatin (MEVACOR) 20 MG tablet Take  20 mg by mouth at bedtime.    Marland Kitchen omeprazole (PRILOSEC) 40 MG capsule     . PROVENTIL HFA 108 (90 BASE) MCG/ACT inhaler     . QUEtiapine (SEROQUEL) 100 MG tablet Take 1 tablet (100 mg total) by mouth at bedtime. 30 tablet 1  . QUEtiapine (SEROQUEL) 25 MG tablet Take 1 tablet (25 mg total) by mouth 2 (two) times daily. 60 tablet 2  . ZIANA gel APPLY TO AFFECTED AREA ON THE SKIN AT BEDTIME  3  . ZOSTAVAX 13086 UNT/0.65ML injection  No current facility-administered medications for this visit.    Medical Decision Making:  Established Problem, Stable/Improving (1)  Treatment Plan Summary:Medication management Major depressive disorder- Increase  Pristiq to 150 mg in the morning.   Continue  Seroquel  100 mg at bedtime.   Generalized anxiety disorder- [place] Seroquel at 50mg  by mouth in the morning. Patient to continue therapy.  She'll follow-up in 3 weeks.  She's been encouraged to call with questions concerns prior to her next appointment.  Kaylee Gordon 12/27/2015, 3:03 PM

## 2016-01-03 ENCOUNTER — Encounter: Payer: BC Managed Care – PPO | Admitting: Physical Therapy

## 2016-01-03 ENCOUNTER — Telehealth: Payer: Self-pay

## 2016-01-03 NOTE — Telephone Encounter (Signed)
received a fax requesting a refill on her diazepam 10mg  no sure if that was discontinued or note on the visit on 12-08-15 according to note pt was taking but on the note for  12-27-15 there is no mention of medication.

## 2016-01-17 ENCOUNTER — Ambulatory Visit (INDEPENDENT_AMBULATORY_CARE_PROVIDER_SITE_OTHER): Payer: BC Managed Care – PPO | Admitting: Psychiatry

## 2016-01-17 ENCOUNTER — Encounter: Payer: Self-pay | Admitting: Psychiatry

## 2016-01-17 VITALS — BP 118/78 | HR 114 | Temp 98.6°F | Ht 64.0 in | Wt 157.8 lb

## 2016-01-17 DIAGNOSIS — F331 Major depressive disorder, recurrent, moderate: Secondary | ICD-10-CM | POA: Diagnosis not present

## 2016-01-17 DIAGNOSIS — F411 Generalized anxiety disorder: Secondary | ICD-10-CM

## 2016-01-17 MED ORDER — QUETIAPINE FUMARATE 100 MG PO TABS: 100.0000 mg | ORAL_TABLET | Freq: Every day | ORAL | Status: DC

## 2016-01-17 MED ORDER — QUETIAPINE FUMARATE 25 MG PO TABS: 25.0000 mg | ORAL_TABLET | Freq: Two times a day (BID) | ORAL | Status: DC

## 2016-01-17 MED ORDER — DESVENLAFAXINE SUCCINATE ER 50 MG PO TB24: 150.0000 mg | ORAL_TABLET | Freq: Every day | ORAL | Status: DC

## 2016-01-17 NOTE — Progress Notes (Signed)
Patient ID: Kaylee Gordon, female   DOB: [birth year], 55 y.o.   MRN: [id number]    [facility] MD/PA/NP OP Progress Note  01/17/2016 3:37 PM Kaylee Gordon  MRN:  [id number]  Subjective:  Patient returns for follow-up of anxiety and major depression. Reports her mood is good overall. 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 States she is nervous and excited at the same time. Her children are doing well. She recently went on a beach trip with her friend and had a good time. Reports her sleep is on and off. But she reports resting well.  She denies any suicidal thoughts.   Chief Complaint: doing better  Visit Diagnosis:     ICD-9-CM ICD-10-CM   1. Major depressive disorder, recurrent episode, moderate (HCC) 296.32 F33.1   2. GAD (generalized anxiety disorder) 300.02 F41.1     Past Medical History:  Past Medical History  Diagnosis Date  . Anxiety   . Depression   . Thyroid disease   . CHF (congestive heart failure) ([place])   . Cancer [facility])     SKIN CANCER  . Hypertension   . Asthma     Past Surgical History  Procedure Laterality Date  . Appendectomy    . Breast enhancement surgery    . Skin cancer excision    . Nasal sinus surgery    . Hemorrhoid surgery    . Colonoscopy with propofol N/A 02/22/2015    Procedure: COLONOSCOPY WITH PROPOFOL;  Surgeon: Josefine Class, MD;  Location: [facility] ENDOSCOPY;  Service: Endoscopy;  Laterality: N/A;  . Esophagogastroduodenoscopy (egd) with propofol N/A 02/22/2015    Procedure: ESOPHAGOGASTRODUODENOSCOPY (EGD) WITH PROPOFOL;  Surgeon: Josefine Class, MD;  Location: [facility] ENDOSCOPY;  Service: Endoscopy;  Laterality: N/A;  . Savory dilation N/A 02/22/2015    Procedure: SAVORY DILATION;  Surgeon: Josefine Class, MD;  Location: [facility] ENDOSCOPY;  Service: Endoscopy;  Laterality: N/A;  . Ankle surgery Right 2012   Family History:  Family History  Problem Relation Age of Onset  . Cancer Mother   . Depression Mother   .  Anxiety disorder Mother   . Heart disease Father   . Cancer Father   . Anxiety disorder Father   . Depression Father   . Hypoparathyroidism Son    Social History:  Social History   Social History  . Marital Status: Married    Spouse Name: N/A  . Number of Children: N/A  . Years of Education: N/A   Social History Main Topics  . Smoking status: Never Smoker   . Smokeless tobacco: Never Used  . Alcohol Use: 0.0 oz/week    0 Standard drinks or equivalent per week     Comment: MAYBE ONCE OR TWICE A MONTH  . Drug Use: No  . Sexual Activity: No   Other Topics Concern  . Not on file   Social History Narrative   Additional History:   Musculoskeletal: Strength & Muscle Tone: within normal limits Gait & Station: normal Patient leans: N/A  Psychiatric Specialty Exam: Anxiety Patient reports no insomnia, nervous/anxious behavior or suicidal ideas.    Depression        Associated symptoms include does not have insomnia and no suicidal ideas.  Past medical history includes anxiety.   Insomnia PMH includes: no depression.    Review of Systems  Psychiatric/Behavioral: Negative for depression, suicidal ideas, hallucinations, memory loss and substance abuse. The patient is not nervous/anxious and does not  have insomnia.   All other systems reviewed and are negative.   There were no vitals taken for this visit.There is no weight on file to calculate BMI.  General Appearance: Neat and Well Groomed  Eye Contact:  Good  Speech:  Clear and Coherent and Normal Rate  Volume:  Normal  Mood:  Improved significantly  Affect:  improved  Thought Process:  Circumstantial  Orientation:  Full (Time, Place, and Person)  Thought Content:  Negative  Suicidal Thoughts:  No  Homicidal Thoughts:  No  Memory:  Immediate;   Good Recent;   Good Remote;   Good  Judgement:  Good  Insight:  Good  Psychomotor Activity:  Negative and Normal  Concentration:  Good  Recall:  Good  Fund of  Knowledge: Good  Language: Good  Akathisia:  Negative  Handed:  Right  AIMS (if indicated):  Not done  Assets:  Communication Skills Desire for Improvement Vocational/Educational  ADL's:  Intact  Cognition: WNL  Sleep:  Sleeping well    Is the patient at risk to self?  No. Has the patient been a risk to self in the past 6 months?  No. Has the patient been a risk to self within the distant past?  No. Is the patient a risk to others?  No. Has the patient been a risk to others in the past 6 months?  No. Has the patient been a risk to others within the distant past?  No.  Current Medications: Current Outpatient Prescriptions  Medication Sig Dispense Refill  . albuterol (PROVENTIL, VENTOLIN) (5 MG/ML) 0.5% NEBU Inhale into the lungs. Reported on 07/07/2015    . calcitRIOL (ROCALTROL) 0.25 MCG capsule TAKE 1 CAPSULE BY MOUTH DAILY. 30 capsule 0  . calcitRIOL (ROCALTROL) 0.25 MCG capsule TAKE 1 CAPSULE BY MOUTH DAILY. 30 capsule 3  . celecoxib (CELEBREX) 200 MG capsule Take 200 mg by mouth 2 (two) times daily.    Marland Kitchen desvenlafaxine (PRISTIQ) 50 MG 24 hr tablet Take 3 tablets (150 mg total) by mouth daily. 90 tablet 1  . dicyclomine (BENTYL) 10 MG capsule Take by mouth.    . esomeprazole (NEXIUM) 40 MG capsule Take by mouth.    . fluticasone (FLONASE) 50 MCG/ACT nasal spray     . levothyroxine (SYNTHROID, LEVOTHROID) 50 MCG tablet Take by mouth.    Marland Kitchen lisinopril (PRINIVIL,ZESTRIL) 5 MG tablet TAKE 1 TABLET BY MOUTH ONCE A DAY    . lovastatin (MEVACOR) 20 MG tablet Take 20 mg by mouth at bedtime.    Marland Kitchen omeprazole (PRILOSEC) 40 MG capsule     . PROVENTIL HFA 108 (90 BASE) MCG/ACT inhaler     . QUEtiapine (SEROQUEL) 100 MG tablet Take 1 tablet (100 mg total) by mouth at bedtime. 30 tablet 1  . QUEtiapine (SEROQUEL) 25 MG tablet Take 1 tablet (25 mg total) by mouth 2 (two) times daily. 60 tablet 2  . ZIANA gel APPLY TO AFFECTED AREA ON THE SKIN AT BEDTIME  3  . ZOSTAVAX 13086 UNT/0.65ML  injection      No current facility-administered medications for this visit.    Medical Decision Making:  Established Problem, Stable/Improving (1)  Treatment Plan Summary:Medication management Major depressive disorder- Continue Pristiq to 150 mg in the morning.   Continue  Seroquel  100 mg at bedtime.   Generalized anxiety disorder- Continue Seroquel at 50mg  by mouth in the morning. Patient to continue therapy.  She'll follow-up in 2 months.  She's been encouraged to call with  questions concerns prior to her next appointment.  Gregor Dershem 01/17/2016, 3:37 PM

## 2016-01-26 ENCOUNTER — Ambulatory Visit (INDEPENDENT_AMBULATORY_CARE_PROVIDER_SITE_OTHER): Payer: BC Managed Care – PPO | Admitting: Endocrinology

## 2016-01-26 VITALS — BP 112/78 | HR 103 | Ht 64.0 in | Wt 153.0 lb

## 2016-01-26 DIAGNOSIS — E200 Idiopathic hypoparathyroidism: Secondary | ICD-10-CM

## 2016-01-26 DIAGNOSIS — R002 Palpitations: Secondary | ICD-10-CM | POA: Diagnosis not present

## 2016-01-26 DIAGNOSIS — E038 Other specified hypothyroidism: Secondary | ICD-10-CM | POA: Diagnosis not present

## 2016-01-26 LAB — BASIC METABOLIC PANEL
BUN: 23 mg/dL (ref 6–23)
CHLORIDE: 107 meq/L (ref 96–112)
CO2: 25 mEq/L (ref 19–32)
Calcium: 9.2 mg/dL (ref 8.4–10.5)
Creatinine, Ser: 0.8 mg/dL (ref 0.40–1.20)
GFR: 78.75 mL/min (ref >60.00)
GLUCOSE: 91 mg/dL (ref 70–99)
POTASSIUM: 3.5 meq/L (ref 3.5–5.1)
Sodium: 135 mEq/L (ref 135–145)

## 2016-01-26 LAB — T4, FREE: Free T4: 0.72 ng/dL (ref 0.60–1.60)

## 2016-01-26 LAB — TSH: TSH: 2.4 u[IU]/mL (ref 0.35–4.50)

## 2016-01-26 NOTE — Progress Notes (Signed)
Patient ID: Kaylee Gordon, female   DOB: 19-Sep-1960, 55 y.o.   MRN: [id number]   History of Present Illness:  Chief complaint: Followup of calcium and thyroid   Hypothyroidism:   She was tried on thyroxine supplements with a borderline TSH levels in 2011 However because of complaints of  fatigue on her visit in 10/15 she had thyroid levels done and free T4 was significantly low She was empirically started on 25 g  of levothyroxine Initially she felt less tired with this; however her free T4 continue to be low and her dose has been increased to 75 g since 1/16 when her free T4 was still low at 0.59  However because of a low TSH in 11/16 her PCP reduce the levothyroxine dose to 0.5 mg again She continues to complain about fatigue Thyroid levels on her last visit are normal including free T4   Her weight is stable  Wt Readings from Last 3 Encounters:  01/26/16 153 lb (69.4 kg)  01/17/16 157 lb 12.8 oz (71.578 kg)  12/08/15 152 lb 6.4 oz (69.128 kg)    Lab Results  Component Value Date   FREET4 0.71 07/28/2015   FREET4 0.80 12/31/2014   FREET4 0.59* 07/22/2014   TSH 2.64 07/22/2014   TSH 1.70 05/06/2014    Hypoparathyroidism    Has had hypocalcemia since she was 55 years old and presented with symptoms of cramping in her hands along with twitching of her face and eyes. 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    No recent symptoms of  tingling in her face, muscle cramping or twitching of muscles. May have only occasional leg cramps She also has less fatigue when she is compliant with her medications Calcium level has been fairly consistent now  She is taking calcitriol 0.25 mcg daily and also her calcium 1200 mg. twice a day   Lab Results  Component Value Date   CALCIUM 9.2 09/03/2015   CALCIUM 9.2 07/28/2015   CALCIUM 8.8 12/31/2014   CALCIUM 9.4 08/18/2014   CALCIUM 8.6 07/22/2014         Medication List        This list is accurate as of: 01/26/16  3:20 PM.  Always use your most recent med list.               calcitRIOL 0.25 MCG capsule  Commonly known as:  ROCALTROL  TAKE 1 CAPSULE BY MOUTH DAILY.     calcitRIOL 0.25 MCG capsule  Commonly known as:  ROCALTROL  TAKE 1 CAPSULE BY MOUTH DAILY.     celecoxib 200 MG capsule  Commonly known as:  CELEBREX  Take 200 mg by mouth 2 (two) times daily.     desvenlafaxine 50 MG 24 hr tablet  Commonly known as:  PRISTIQ  Take 3 tablets (150 mg total) by mouth daily.     dicyclomine 10 MG capsule  Commonly known as:  BENTYL  Take by mouth. Reported on 01/26/2016     esomeprazole 40 MG capsule  Commonly known as:  NEXIUM  Take by mouth.     fluticasone 50 MCG/ACT nasal spray  Commonly known as:  FLONASE     levothyroxine 50 MCG tablet  Commonly known as:  SYNTHROID, LEVOTHROID  Take by mouth.     lisinopril 5 MG tablet  Commonly known as:  PRINIVIL,ZESTRIL  TAKE 1 TABLET BY MOUTH ONCE A DAY     lovastatin 20 MG tablet  Commonly known as:  MEVACOR  Take 20 mg by mouth at bedtime.     omeprazole 40 MG capsule  Commonly known as:  PRILOSEC     albuterol (5 MG/ML) 0.5% Nebu  Commonly known as:  PROVENTIL, VENTOLIN  Inhale into the lungs. Reported on 07/07/2015     PROVENTIL HFA 108 (90 Base) MCG/ACT inhaler  Generic drug:  albuterol     QUEtiapine 100 MG tablet  Commonly known as:  SEROQUEL  Take 1 tablet (100 mg total) by mouth at bedtime.     QUEtiapine 25 MG tablet  Commonly known as:  SEROQUEL  Take 1 tablet (25 mg total) by mouth 2 (two) times daily.     ZIANA gel  Generic drug:  clindamycin-tretinoin  APPLY TO AFFECTED AREA ON THE SKIN AT BEDTIME     ZOSTAVAX 96295 UNT/0.65ML injection  Generic drug:  zoster vaccine live (PF)        Allergies:  Allergies  Allergen Reactions  . Parafon Forte Dsc [Chlorzoxazone]   . Pollen Extract   . Sudafed [Pseudoephedrine Hcl]     Past Medical History  Diagnosis Date   . Anxiety   . Depression   . Thyroid disease   . CHF (congestive heart failure) ([place])   . Cancer [facility])     SKIN CANCER  . Hypertension   . Asthma     Past Surgical History  Procedure Laterality Date  . Appendectomy    . Breast enhancement surgery    . Skin cancer excision    . Nasal sinus surgery    . Hemorrhoid surgery    . Colonoscopy with propofol N/A 02/22/2015    Procedure: COLONOSCOPY WITH PROPOFOL;  Surgeon: Josefine Class, MD;  Location: [facility] ENDOSCOPY;  Service: Endoscopy;  Laterality: N/A;  . Esophagogastroduodenoscopy (egd) with propofol N/A 02/22/2015    Procedure: ESOPHAGOGASTRODUODENOSCOPY (EGD) WITH PROPOFOL;  Surgeon: Josefine Class, MD;  Location: [facility] ENDOSCOPY;  Service: Endoscopy;  Laterality: N/A;  . Savory dilation N/A 02/22/2015    Procedure: SAVORY DILATION;  Surgeon: Josefine Class, MD;  Location: [facility] ENDOSCOPY;  Service: Endoscopy;  Laterality: N/A;  . Ankle surgery Right 2012    Family History  Problem Relation Age of Onset  . Cancer Mother   . Depression Mother   . Anxiety disorder Mother   . Heart disease Father   . Cancer Father   . Anxiety disorder Father   . Depression Father   . Hypoparathyroidism Son     Social History:  reports that she has never smoked. She has never used smokeless tobacco. She reports that she drinks alcohol. She reports that she does not use illicit drugs.  Review of Systems -   She has a history of hypertension and is recently on  lisinopril Previously was having hypokalemia and HCTZ was stopped  TACHYCARDIA: She thinks her pulse is usually over 100 but her cardiologist and PCP do not think she needs to be on a beta blocker.  She is concerned how she feels also.  Menopause: she had a normal FSH, since she had an endometrial ablation she does not know if he is having any cycles; no hot flashes Her gynecologist is Dr. Anders Simmonds  History of only mild osteopenia with T score -1.2 at the right  hip  History of depression, is on Seroquel low dose and  Wellbutrin  HYPERCHOLESTEROLEMIA:  She thinks her baseline LDL was about 195. However had severe fatigue with Lipitor and recent LDL  is below 100 with taking low dose lovastatin   EXAM:  BP 112/78 mmHg  Pulse 103  Ht 5\' 4"  (1.626 m)  Wt 153 lb (69.4 kg)  BMI 26.25 kg/m2  SpO2 97%   Biceps reflexes appear normal [place]ek sign negative  Assessment/Plan:   HYPOTHYROIDISM:  She has secondary hypothyroidism with persistently low free T4 and normal TSH.   She continues to have fatigue, does have other medical issues  Will check her free T4 level and TSH again  Increase resting heart rate: Unlikely to be from thyroid hormone since free T4 level has not been high  She is agreeable to cardiology consultation for second opinion   HYPOPARATHYROIDISM, idiopathic:  Her calcium has been well controlled current regimen of 0.25 mcg calcitriol and calcium 1200 mg a day She has been very consistent with her medication and is asymptomatic now She will have labs checked today    [facility] 01/26/2016, 3:20 PM   Addendum: Labs as follows, since free T4 is low normal Will have her increase her dose by half tablet twice a week   Office Visit on 01/26/2016  Component Date Value Ref Range Status  . Sodium 01/26/2016 135  135 - 145 mEq/L Final  . Potassium 01/26/2016 3.5  3.5 - 5.1 mEq/L Final  . Chloride 01/26/2016 107  96 - 112 mEq/L Final  . CO2 01/26/2016 25  19 - 32 mEq/L Final  . Glucose, Bld 01/26/2016 91  70 - 99 mg/dL Final  . BUN 01/26/2016 23  6 - 23 mg/dL Final  . Creatinine, Ser 01/26/2016 0.80  0.40 - 1.20 mg/dL Final  . Calcium 01/26/2016 9.2  8.4 - 10.5 mg/dL Final  . GFR 01/26/2016 78.75  >60.00 mL/min Final  . TSH 01/26/2016 2.40  0.35 - 4.50 uIU/mL Final  . Free T4 01/26/2016 0.72  0.60 - 1.60 ng/dL Final

## 2016-01-28 ENCOUNTER — Telehealth: Payer: Self-pay

## 2016-01-28 ENCOUNTER — Encounter: Payer: Self-pay | Admitting: Endocrinology

## 2016-01-28 ENCOUNTER — Other Ambulatory Visit: Payer: Self-pay

## 2016-01-28 MED ORDER — LEVOTHYROXINE SODIUM 50 MCG PO TABS
50.0000 ug | ORAL_TABLET | ORAL | Status: DC
Start: 2016-01-28 — End: 2017-08-30

## 2016-01-28 NOTE — Telephone Encounter (Signed)
Called and spoke with patient about lab results, and medication changes. Will send in new rx for the changes made to synthroid dose. No other questions or concerns from patient.

## 2016-01-28 NOTE — Addendum Note (Signed)
Addended by: Elayne Snare on: 01/28/2016 09:57 AM   Modules accepted: Orders

## 2016-02-04 ENCOUNTER — Encounter: Payer: Self-pay | Admitting: *Deleted

## 2016-02-10 ENCOUNTER — Encounter: Payer: Self-pay | Admitting: *Deleted

## 2016-02-11 ENCOUNTER — Encounter
Admission: RE | Disposition: A | Discharge status: Home / Self Care | Payer: Self-pay | Source: Ambulatory Visit | Attending: Unknown Physician Specialty

## 2016-02-11 ENCOUNTER — Ambulatory Visit: Payer: BC Managed Care – PPO | Admitting: Anesthesiology

## 2016-02-11 ENCOUNTER — Encounter: Payer: Self-pay | Admitting: Anesthesiology

## 2016-02-11 ENCOUNTER — Ambulatory Visit
Admission: RE | Admit: 2016-02-11 | Discharge: 2016-02-11 | Disposition: A | Discharge status: Home / Self Care | Payer: BC Managed Care – PPO | Source: Ambulatory Visit | Attending: Unknown Physician Specialty | Admitting: Unknown Physician Specialty

## 2016-02-11 DIAGNOSIS — Z7951 Long term (current) use of inhaled steroids: Secondary | ICD-10-CM | POA: Insufficient documentation

## 2016-02-11 DIAGNOSIS — J45909 Unspecified asthma, uncomplicated: Secondary | ICD-10-CM | POA: Diagnosis not present

## 2016-02-11 DIAGNOSIS — Z818 Family history of other mental and behavioral disorders: Secondary | ICD-10-CM | POA: Diagnosis not present

## 2016-02-11 DIAGNOSIS — Z85828 Personal history of other malignant neoplasm of skin: Secondary | ICD-10-CM | POA: Diagnosis not present

## 2016-02-11 DIAGNOSIS — Z9882 Breast implant status: Secondary | ICD-10-CM | POA: Diagnosis not present

## 2016-02-11 DIAGNOSIS — K2950 Unspecified chronic gastritis without bleeding: Secondary | ICD-10-CM | POA: Insufficient documentation

## 2016-02-11 DIAGNOSIS — Z9889 Other specified postprocedural states: Secondary | ICD-10-CM | POA: Insufficient documentation

## 2016-02-11 DIAGNOSIS — F329 Major depressive disorder, single episode, unspecified: Secondary | ICD-10-CM | POA: Insufficient documentation

## 2016-02-11 DIAGNOSIS — K2960 Other gastritis without bleeding: Secondary | ICD-10-CM | POA: Diagnosis not present

## 2016-02-11 DIAGNOSIS — F419 Anxiety disorder, unspecified: Secondary | ICD-10-CM | POA: Diagnosis not present

## 2016-02-11 DIAGNOSIS — Z79899 Other long term (current) drug therapy: Secondary | ICD-10-CM | POA: Diagnosis not present

## 2016-02-11 DIAGNOSIS — Z8349 Family history of other endocrine, nutritional and metabolic diseases: Secondary | ICD-10-CM | POA: Insufficient documentation

## 2016-02-11 DIAGNOSIS — E079 Disorder of thyroid, unspecified: Secondary | ICD-10-CM | POA: Diagnosis not present

## 2016-02-11 DIAGNOSIS — Z8249 Family history of ischemic heart disease and other diseases of the circulatory system: Secondary | ICD-10-CM | POA: Insufficient documentation

## 2016-02-11 DIAGNOSIS — R1310 Dysphagia, unspecified: Secondary | ICD-10-CM | POA: Insufficient documentation

## 2016-02-11 DIAGNOSIS — Z8489 Family history of other specified conditions: Secondary | ICD-10-CM | POA: Diagnosis not present

## 2016-02-11 DIAGNOSIS — I110 Hypertensive heart disease with heart failure: Secondary | ICD-10-CM | POA: Diagnosis not present

## 2016-02-11 DIAGNOSIS — K222 Esophageal obstruction: Secondary | ICD-10-CM | POA: Diagnosis not present

## 2016-02-11 DIAGNOSIS — I509 Heart failure, unspecified: Secondary | ICD-10-CM | POA: Diagnosis not present

## 2016-02-11 HISTORY — PX: SAVORY DILATION: SHX5439

## 2016-02-11 HISTORY — PX: ESOPHAGOGASTRODUODENOSCOPY (EGD) WITH PROPOFOL: SHX5813

## 2016-02-11 SURGERY — ESOPHAGOGASTRODUODENOSCOPY (EGD) WITH PROPOFOL
Anesthesia: General

## 2016-02-11 MED ORDER — PROPOFOL 10 MG/ML IV BOLUS
INTRAVENOUS | Status: DC | PRN
  Administered 2016-02-11: 40 mg via INTRAVENOUS
  Administered 2016-02-11: 50 mg via INTRAVENOUS
  Administered 2016-02-11: 40 mg via INTRAVENOUS

## 2016-02-11 MED ORDER — MIDAZOLAM HCL 5 MG/5ML IJ SOLN
INTRAMUSCULAR | Status: DC | PRN
  Administered 2016-02-11: 2 mg via INTRAVENOUS

## 2016-02-11 MED ORDER — SODIUM CHLORIDE 0.9 % IV SOLN: INTRAVENOUS | Status: DC

## 2016-02-11 MED ORDER — LIDOCAINE HCL (CARDIAC) 20 MG/ML IV SOLN: INTRAVENOUS | Status: DC | PRN

## 2016-02-11 MED ORDER — LIDOCAINE HCL (CARDIAC) 20 MG/ML IV SOLN
INTRAVENOUS | Status: DC | PRN
  Administered 2016-02-11: 100 mg via INTRAVENOUS

## 2016-02-11 MED ORDER — LACTATED RINGERS IV SOLN
INTRAVENOUS | Status: DC | PRN
  Administered 2016-02-11: 14:00:00 via INTRAVENOUS

## 2016-02-11 MED ORDER — PROPOFOL 10 MG/ML IV BOLUS: INTRAVENOUS | Status: DC | PRN

## 2016-02-11 MED ORDER — PROPOFOL 500 MG/50ML IV EMUL
INTRAVENOUS | Status: DC | PRN
  Administered 2016-02-11: 180 ug/kg/min via INTRAVENOUS

## 2016-02-11 NOTE — Transfer of Care (Signed)
Immediate Anesthesia Transfer of Care Note  Patient: Kaylee Gordon  Procedure(s) Performed: Procedure(s): ESOPHAGOGASTRODUODENOSCOPY (EGD) WITH PROPOFOL (N/A) SAVORY DILATION (N/A)  Patient Location: PACU  Anesthesia Type:General  Level of Consciousness: awake  Airway & Oxygen Therapy: Patient Spontanous Breathing and Patient connected to nasal cannula oxygen  Post-op Assessment: Report given to RN and Post -op Vital signs reviewed and stable  Post vital signs: Reviewed and stable  Last Vitals:  Vitals:   02/11/16 1333 02/11/16 1455  BP: (!) 141/84 102/75  Pulse: 100 94  Resp: 20 11  Temp: 37.3 C 36.2 C    Last Pain:  Vitals:   02/11/16 1455  TempSrc: Tympanic         Complications: No apparent anesthesia complications

## 2016-02-11 NOTE — Anesthesia Postprocedure Evaluation (Signed)
Anesthesia Post Note  Patient: Kaylee Gordon  Procedure(s) Performed: Procedure(s) (LRB): ESOPHAGOGASTRODUODENOSCOPY (EGD) WITH PROPOFOL (N/A) SAVORY DILATION (N/A)  Patient location during evaluation: PACU Anesthesia Type: General Level of consciousness: awake and alert and oriented Pain management: pain level controlled Vital Signs Assessment: post-procedure vital signs reviewed and stable Respiratory status: spontaneous breathing, nonlabored ventilation and respiratory function stable Cardiovascular status: blood pressure returned to baseline and stable Postop Assessment: no signs of nausea or vomiting Anesthetic complications: no    Last Vitals:  Vitals:   02/11/16 1515 02/11/16 1525  BP: 106/88 108/71  Pulse: 90 89  Resp: 17 19  Temp:      Last Pain:  Vitals:   02/11/16 1455  TempSrc: Tympanic                 Aviva Wolfer

## 2016-02-11 NOTE — H&P (Signed)
Primary Care Physician:  Dion Body, MD Primary Gastroenterologist:  Dr. Vira Agar  Pre-Procedure History & Physical: HPI:  Kaylee Gordon is a 55 y.o. female is here for an endoscopy.   Past Medical History:  Diagnosis Date  . Anxiety   . Asthma   . Cancer [facility])    SKIN CANCER  . CHF (congestive heart failure) ([place])   . Depression   . Hypertension   . Thyroid disease     Past Surgical History:  Procedure Laterality Date  . ANKLE SURGERY Right 2012  . APPENDECTOMY    . BREAST ENHANCEMENT SURGERY    . COLONOSCOPY WITH PROPOFOL N/A 02/22/2015   Procedure: COLONOSCOPY WITH PROPOFOL;  Surgeon: Josefine Class, MD;  Location: [facility] ENDOSCOPY;  Service: Endoscopy;  Laterality: N/A;  . ESOPHAGOGASTRODUODENOSCOPY (EGD) WITH PROPOFOL N/A 02/22/2015   Procedure: ESOPHAGOGASTRODUODENOSCOPY (EGD) WITH PROPOFOL;  Surgeon: Josefine Class, MD;  Location: [facility] ENDOSCOPY;  Service: Endoscopy;  Laterality: N/A;  . HEMORRHOID SURGERY    . NASAL SINUS SURGERY    . SAVORY DILATION N/A 02/22/2015   Procedure: SAVORY DILATION;  Surgeon: Josefine Class, MD;  Location: [facility] ENDOSCOPY;  Service: Endoscopy;  Laterality: N/A;  . SKIN CANCER EXCISION      Prior to Admission medications   Medication Sig Start Date End Date Taking? Authorizing Provider  albuterol (PROVENTIL, VENTOLIN) (5 MG/ML) 0.5% NEBU Inhale into the lungs. Reported on 07/07/2015   Yes Historical Provider, MD  desvenlafaxine (PRISTIQ) 50 MG 24 hr tablet Take 3 tablets (150 mg total) by mouth daily. 01/17/16  Yes Himabindu Ravi, MD  fluticasone (FLONASE) 50 MCG/ACT nasal spray  12/25/12  Yes Historical Provider, MD  montelukast (SINGULAIR) 10 MG tablet Take 10 mg by mouth at bedtime.   Yes Historical Provider, MD  calcitRIOL (ROCALTROL) 0.25 MCG capsule TAKE 1 CAPSULE BY MOUTH DAILY. 05/17/15   Elayne Snare, MD  calcitRIOL (ROCALTROL) 0.25 MCG capsule TAKE 1 CAPSULE BY MOUTH DAILY. 11/01/15   Elayne Snare, MD  celecoxib  (CELEBREX) 200 MG capsule Take 200 mg by mouth 2 (two) times daily.    Historical Provider, MD  dicyclomine (BENTYL) 10 MG capsule Take by mouth. Reported on 01/26/2016 05/25/15 05/24/16  Historical Provider, MD  esomeprazole (NEXIUM) 40 MG capsule Take by mouth.    Historical Provider, MD  levothyroxine (SYNTHROID, LEVOTHROID) 50 MCG tablet Take 1 tablet (50 mcg total) by mouth once a week. Take by mouth; add a half a tablet twice a week. 01/28/16   Elayne Snare, MD  lisinopril (PRINIVIL,ZESTRIL) 5 MG tablet TAKE 1 TABLET BY MOUTH ONCE A DAY 09/11/14   Historical Provider, MD  lovastatin (MEVACOR) 20 MG tablet Take 20 mg by mouth at bedtime.    Historical Provider, MD  omeprazole (PRILOSEC) 40 MG capsule  03/21/15   Historical Provider, MD  PROVENTIL HFA 108 (90 BASE) MCG/ACT inhaler  10/21/14   Historical Provider, MD  QUEtiapine (SEROQUEL) 100 MG tablet Take 1 tablet (100 mg total) by mouth at bedtime. 01/17/16   Himabindu Ravi, MD  QUEtiapine (SEROQUEL) 25 MG tablet Take 1 tablet (25 mg total) by mouth 2 (two) times daily. 01/17/16 01/16/17  Himabindu Ravi, MD  ZIANA gel APPLY TO AFFECTED AREA ON THE SKIN AT BEDTIME 01/03/15   Historical Provider, MD  ZOSTAVAX 09811 UNT/0.65ML injection  09/15/13   Historical Provider, MD    Allergies as of 01/26/2016 - Review Complete 01/26/2016  Allergen Reaction Noted  . Parafon forte dsc [chlorzoxazone]  02/14/2013  . Pollen extract  10/27/2014  . Sudafed [pseudoephedrine hcl]  02/14/2013    Family History  Problem Relation Age of Onset  . Cancer Mother   . Depression Mother   . Anxiety disorder Mother   . Heart disease Father   . Cancer Father   . Anxiety disorder Father   . Depression Father   . Hypoparathyroidism Son     Social History   Social History  . Marital status: Married    Spouse name: N/A  . Number of children: N/A  . Years of education: N/A   Occupational History  . Not on file.   Social History Main Topics  . Smoking status: Never  Smoker  . Smokeless tobacco: Never Used  . Alcohol use 0.0 oz/week     Comment: MAYBE ONCE OR TWICE A MONTH  . Drug use: No  . Sexual activity: No   Other Topics Concern  . Not on file   Social History Narrative  . No narrative on file    Review of Systems: See HPI, otherwise negative ROS  Physical Exam: BP (!) 141/84   Pulse 100   Temp 99.1 F (37.3 C) (Tympanic)   Resp 20   LMP  (LMP Unknown)   SpO2 100%  General:   Alert,  pleasant and cooperative in NAD Head:  Normocephalic and atraumatic. Neck:  Supple; no masses or thyromegaly. Lungs:  Clear throughout to auscultation.    Heart:  Regular rate and rhythm. Abdomen:  Soft, nontender and nondistended. Normal bowel sounds, without guarding, and without rebound.   Neurologic:  Alert and  oriented x4;  grossly normal neurologically.  Impression/Plan: [name] is here for an endoscopy to be performed for dysphagia  Risks, benefits, limitations, and alternatives regarding  endoscopy have been reviewed with the patient.  Questions have been answered.  All parties agreeable.   Gaylyn Cheers, MD  02/11/2016, 2:20 PM

## 2016-02-11 NOTE — Op Note (Signed)
[facility] Gastroenterology Patient Name: Kaylee Gordon Procedure Date: 02/11/2016 2:24 PM MRN: [id number] Account #: [id number] Date of Birth: 04/05/1961 Admit Type: Outpatient Age: 55 Room: [name] Gender: Female Note Status: Finalized Procedure:            Upper GI endoscopy Indications:          Dysphagia Providers:            Manya Silvas, MD Referring MD:         Dion Body (Referring MD) Medicines:            Propofol per Anesthesia Complications:        No immediate complications. Procedure:            Pre-Anesthesia Assessment:                       - After reviewing the risks and benefits, the patient                        was deemed in satisfactory condition to undergo the                        procedure.                       After obtaining informed consent, the endoscope was                        passed under direct vision. Throughout the procedure,                        the patient's blood pressure, pulse, and oxygen                        saturations were monitored continuously. The Endoscope                        was introduced through the mouth, and advanced to the                        second part of duodenum. The upper GI endoscopy was                        accomplished without difficulty. The patient tolerated                        the procedure well. Findings:      A mild and moderate Schatzki ring (acquired) was found at the       gastroesophageal junction. GEJ at 39cm. A TTS dilator was passed through       the scope. Dilation with an 18-19-20 mm balloon dilator was performed to       18 mm.      Mild inflammation characterized by erosions and erythema was found in       the proximal gastric body. Biopsies were taken with a cold forceps for       histology.      Localized moderate-severe inflammation characterized by erythema,       friability and granularity was found in the gastric antrum and in the    prepyloric region of the stomach. Biopsies were taken with a cold  forceps for histology. Biopsies were taken with a cold forceps for       Helicobacter pylori testing.      The examined duodenum was normal. Impression:           - Mild and moderate Schatzki ring. Dilated.                       - Erosive gastritis. Biopsied.                       - Acute gastritis. Biopsied.                       - Normal examined duodenum. Recommendation:       - Await pathology results. Avoid all non steroidals                        like Advil, Aleve, asprin. Take medicine to shut off                        acid production. Manya Silvas, MD 02/11/2016 2:51:47 PM This report has been signed electronically. Number of Addenda: 0 Note Initiated On: 02/11/2016 2:24 PM      [facility]

## 2016-02-11 NOTE — Anesthesia Procedure Notes (Signed)
Date/Time: 02/11/2016 2:29 PM Performed by: Allean Found Pre-anesthesia Checklist: Patient identified, Emergency Drugs available, Suction available, Patient being monitored and Timeout performed Patient Re-evaluated:Patient Re-evaluated prior to inductionOxygen Delivery Method: Nasal cannula Airway Equipment and Method: Bite block Placement Confirmation: positive ETCO2 Difficulty Due To: Difficult Airway- due to anterior larynx

## 2016-02-11 NOTE — Anesthesia Preprocedure Evaluation (Addendum)
Anesthesia Evaluation  Patient identified by MRN, date of birth, ID band Patient awake    Reviewed: Allergy & Precautions, H&P , NPO status , Patient's Chart, lab work & pertinent test results, reviewed documented beta blocker date and time   History of Anesthesia Complications Negative for: history of anesthetic complications  Airway Mallampati: I  TM Distance: >3 FB Neck ROM: full    Dental no notable dental hx.    Pulmonary neg pulmonary ROS, shortness of breath and with exertion, asthma ,    Pulmonary exam normal breath sounds clear to auscultation       Cardiovascular Exercise Tolerance: Good hypertension, Pt. on medications + angina +CHF  (-) CAD, (-) Past MI and (-) CABG Normal cardiovascular exam Rhythm:regular Rate:Normal     Neuro/Psych PSYCHIATRIC DISORDERS Anxiety Depression Bipolar Disorder negative neurological ROS     GI/Hepatic negative GI ROS, Neg liver ROS,   Endo/Other  neg diabetesHypothyroidism   Renal/GU negative Renal ROS  negative genitourinary   Musculoskeletal   Abdominal   Peds negative pediatric ROS (+)  Hematology negative hematology ROS (+)   Anesthesia Other Findings Past Medical History:   Anxiety                                                      Depression                                                   Thyroid disease                                              Cancer                                                         Comment:SKIN CANCER   CHF (congestive heart failure)                               Reproductive/Obstetrics negative OB ROS                             Anesthesia Physical  Anesthesia Plan  ASA: III  Anesthesia Plan: General   Post-op Pain Management:    Induction: Intravenous  Airway Management Planned: Nasal Cannula  Additional Equipment:   Intra-op Plan:   Post-operative Plan:   Informed Consent: I have  reviewed the patients History and Physical, chart, labs and discussed the procedure including the risks, benefits and alternatives for the proposed anesthesia with the patient or authorized representative who has indicated his/her understanding and acceptance.   Dental Advisory Given  Plan Discussed with: Anesthesiologist, CRNA and Surgeon  Anesthesia Plan Comments:         Anesthesia Quick Evaluation

## 2016-02-14 ENCOUNTER — Encounter: Payer: Self-pay | Admitting: Unknown Physician Specialty

## 2016-02-15 LAB — SURGICAL PATHOLOGY

## 2016-03-07 ENCOUNTER — Other Ambulatory Visit: Payer: Self-pay | Admitting: Endocrinology

## 2016-03-07 NOTE — Telephone Encounter (Signed)
Remind patient to schedule follow up

## 2016-03-21 ENCOUNTER — Ambulatory Visit: Payer: BC Managed Care – PPO | Admitting: Psychiatry

## 2016-04-04 ENCOUNTER — Ambulatory Visit (INDEPENDENT_AMBULATORY_CARE_PROVIDER_SITE_OTHER): Payer: BC Managed Care – PPO | Admitting: Psychiatry

## 2016-04-04 ENCOUNTER — Encounter: Payer: Self-pay | Admitting: Psychiatry

## 2016-04-04 VITALS — BP 136/84 | HR 102 | Ht 64.0 in | Wt 162.6 lb

## 2016-04-04 DIAGNOSIS — F3130 Bipolar disorder, current episode depressed, mild or moderate severity, unspecified: Secondary | ICD-10-CM | POA: Diagnosis not present

## 2016-04-04 MED ORDER — QUETIAPINE FUMARATE 100 MG PO TABS: 100.0000 mg | ORAL_TABLET | Freq: Every day | ORAL | 1 refills | Status: DC

## 2016-04-04 MED ORDER — DESVENLAFAXINE SUCCINATE ER 50 MG PO TB24: 100.0000 mg | ORAL_TABLET | Freq: Every day | ORAL | 1 refills | Status: DC

## 2016-04-04 NOTE — Progress Notes (Signed)
Patient ID: Kaylee Gordon, female   DOB: 1961/03/23, 55 y.o.   MRN: [id number]    [facility] MD/PA/NP OP Progress Note  04/04/2016 3:44 PM Kaylee Gordon  MRN:  [id number]  Subjective:  Patient returns for follow-up of anxiety and major depression. Reports her mood is good overall. 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 Reports that she has increased pulse, does not bother her. Her cardiologist mentioned that the combination of Pristiq and seroquel could be causing this, but states he was not sure about this. She denies any suicidal thoughts.   Chief Complaint: doing better Chief Complaint    Follow-up; Medication Refill; Medication Problem     Visit Diagnosis:     ICD-9-CM ICD-10-CM   1. Bipolar I disorder, most recent episode depressed ([place]) 296.50 F31.30     Past Medical History:  Past Medical History:  Diagnosis Date  . Anxiety   . Asthma   . Cancer [facility])    SKIN CANCER  . CHF (congestive heart failure) ([place])   . Depression   . Hypertension   . Thyroid disease     Past Surgical History:  Procedure Laterality Date  . ANKLE SURGERY Right 2012  . APPENDECTOMY    . BREAST ENHANCEMENT SURGERY    . COLONOSCOPY WITH PROPOFOL N/A 02/22/2015   Procedure: COLONOSCOPY WITH PROPOFOL;  Surgeon: Josefine Class, MD;  Location[facility] ENDOSCOPY;  Service: Endoscopy;  Laterality: N/A;  . ESOPHAGOGASTRODUODENOSCOPY (EGD) WITH PROPOFOL N/A 02/22/2015   Procedure: ESOPHAGOGASTRODUODENOSCOPY (EGD) WITH PROPOFOL;  Surgeon: Josefine Class, MD;  Location: [facility] ENDOSCOPY;  Service: Endoscopy;  Laterality: N/A;  . ESOPHAGOGASTRODUODENOSCOPY (EGD) WITH PROPOFOL N/A 02/11/2016   Procedure: ESOPHAGOGASTRODUODENOSCOPY (EGD) WITH PROPOFOL;  Surgeon: Manya Silvas, MD;  Location: [facility] ENDOSCOPY;  Service: Endoscopy;  Laterality: N/A;  . HEMORRHOID SURGERY    . NASAL SINUS SURGERY    . SAVORY DILATION N/A 02/22/2015   Procedure: SAVORY DILATION;  Surgeon: Josefine Class, MD;   Location: [facility] ENDOSCOPY;  Service: Endoscopy;  Laterality: N/A;  . SAVORY DILATION N/A 02/11/2016   Procedure: SAVORY DILATION;  Surgeon: Manya Silvas, MD;  Location: [facility] ENDOSCOPY;  Service: Endoscopy;  Laterality: N/A;  . SKIN CANCER EXCISION     Family History:  Family History  Problem Relation Age of Onset  . Cancer Mother   . Depression Mother   . Anxiety disorder Mother   . Heart disease Father   . Cancer Father   . Anxiety disorder Father   . Depression Father   . Hypoparathyroidism Son    Social History:  Social History   Social History  . Marital status: Married    Spouse name: N/A  . Number of children: N/A  . Years of education: N/A   Social History Main Topics  . Smoking status: Never Smoker  . Smokeless tobacco: Never Used  . Alcohol use 0.0 oz/week     Comment: MAYBE ONCE OR TWICE A MONTH  . Drug use: No  . Sexual activity: No   Other Topics Concern  . None   Social History Narrative  . None   Additional History:   Musculoskeletal: Strength & Muscle Tone: within normal limits Gait & Station: normal Patient leans: N/A  Psychiatric Specialty Exam: Anxiety  Patient reports no insomnia, nervous/anxious behavior or suicidal ideas.    Depression         Associated symptoms include does not have insomnia and no suicidal ideas.  Past medical history  includes anxiety.   Insomnia  PMH includes: no depression.  Medication Refill     Review of Systems  Psychiatric/Behavioral: Negative for depression, hallucinations, memory loss, substance abuse and suicidal ideas. The patient is not nervous/anxious and does not have insomnia.   All other systems reviewed and are negative.   Blood pressure 136/84, pulse (!) 102, height 5\' 4"  (1.626 m), weight 162 lb 9.6 oz (73.8 kg).Body mass index is 27.91 kg/m.  General Appearance: Neat and Well Groomed  Eye Contact:  Good  Speech:  Clear and Coherent and Normal Rate  Volume:  Normal  Mood:  Improved  significantly  Affect:  improved  Thought Process:  Circumstantial  Orientation:  Full (Time, Place, and Person)  Thought Content:  Negative  Suicidal Thoughts:  No  Homicidal Thoughts:  No  Memory:  Immediate;   Good Recent;   Good Remote;   Good  Judgement:  Good  Insight:  Good  Psychomotor Activity:  Negative and Normal  Concentration:  Good  Recall:  Good  Fund of Knowledge: Good  Language: Good  Akathisia:  Negative  Handed:  Right  AIMS (if indicated):  Not done  Assets:  Communication Skills Desire for Improvement Vocational/Educational  ADL's:  Intact  Cognition: WNL  Sleep:  Sleeping well    Is the patient at risk to self?  No. Has the patient been a risk to self in the past 6 months?  No. Has the patient been a risk to self within the distant past?  No. Is the patient a risk to others?  No. Has the patient been a risk to others in the past 6 months?  No. Has the patient been a risk to others within the distant past?  No.  Current Medications: Current Outpatient Prescriptions  Medication Sig Dispense Refill  . albuterol (PROVENTIL, VENTOLIN) (5 MG/ML) 0.5% NEBU Inhale into the lungs. Reported on 07/07/2015    . calcitRIOL (ROCALTROL) 0.25 MCG capsule TAKE 1 CAPSULE BY MOUTH DAILY. 30 capsule 0  . calcitRIOL (ROCALTROL) 0.25 MCG capsule TAKE 1 CAPSULE BY MOUTH DAILY. 30 capsule 3  . calcitRIOL (ROCALTROL) 0.25 MCG capsule TAKE 1 CAPSULE BY MOUTH DAILY. 30 capsule 0  . celecoxib (CELEBREX) 200 MG capsule Take 200 mg by mouth 2 (two) times daily.    Marland Kitchen desvenlafaxine (PRISTIQ) 50 MG 24 hr tablet Take 2 tablets (100 mg total) by mouth daily. 60 tablet 1  . dicyclomine (BENTYL) 10 MG capsule Take by mouth. Reported on 01/26/2016    . esomeprazole (NEXIUM) 40 MG capsule Take by mouth.    . fluticasone (FLONASE) 50 MCG/ACT nasal spray     . levothyroxine (SYNTHROID, LEVOTHROID) 50 MCG tablet Take 1 tablet (50 mcg total) by mouth once a week. Take by mouth; add a half a  tablet twice a week. 90 tablet 0  . lisinopril (PRINIVIL,ZESTRIL) 5 MG tablet TAKE 1 TABLET BY MOUTH ONCE A DAY    . lovastatin (MEVACOR) 20 MG tablet Take 20 mg by mouth at bedtime.    . montelukast (SINGULAIR) 10 MG tablet Take 10 mg by mouth at bedtime.    Marland Kitchen omeprazole (PRILOSEC) 40 MG capsule     . PROVENTIL HFA 108 (90 BASE) MCG/ACT inhaler     . QUEtiapine (SEROQUEL) 100 MG tablet Take 1 tablet (100 mg total) by mouth at bedtime. 30 tablet 1  . ZIANA gel APPLY TO AFFECTED AREA ON THE SKIN AT BEDTIME  3  . ZOSTAVAX 09811 UNT/0.65ML  injection      No current facility-administered medications for this visit.     Medical Decision Making:  Established Problem, Stable/Improving (1)  Treatment Plan Summary:Medication management Major depressive disorder- Decrease Pristiq to 100 mg in the morning.   Continue  Seroquel  100 mg at bedtime.   Generalized anxiety disorder- Same  As above.  She'll follow-up in 1 months.  She's been encouraged to call with questions concerns prior to her next appointment.  Davanee Klinkner 04/04/2016, 3:44 PM

## 2016-04-06 ENCOUNTER — Other Ambulatory Visit: Payer: Self-pay | Admitting: Endocrinology

## 2016-04-26 ENCOUNTER — Telehealth: Payer: Self-pay

## 2016-04-26 NOTE — Telephone Encounter (Signed)
Medication refill request received from patient's [place] for Pristiq 50 mg tablet, 3 tablets by mouth daily.

## 2016-04-27 NOTE — Telephone Encounter (Signed)
Patient's Pristiq was decreased to 100 mg. We are not sure why Kaylee Gordon calling for refill. Patient had a refill approved on September 19.

## 2016-05-02 ENCOUNTER — Other Ambulatory Visit: Payer: Self-pay | Admitting: Endocrinology

## 2016-05-04 ENCOUNTER — Ambulatory Visit: Payer: BC Managed Care – PPO | Admitting: Psychiatry

## 2016-05-15 ENCOUNTER — Other Ambulatory Visit: Payer: Self-pay | Admitting: Orthopedic Surgery

## 2016-05-15 DIAGNOSIS — M5412 Radiculopathy, cervical region: Secondary | ICD-10-CM

## 2016-05-17 ENCOUNTER — Ambulatory Visit: Payer: BC Managed Care – PPO | Admitting: Psychiatry

## 2016-05-18 ENCOUNTER — Ambulatory Visit: Payer: BC Managed Care – PPO | Admitting: Psychiatry

## 2016-05-23 ENCOUNTER — Ambulatory Visit: Payer: BC Managed Care – PPO | Admitting: Psychiatry

## 2016-05-24 ENCOUNTER — Ambulatory Visit
Admission: RE | Admit: 2016-05-24 | Discharge: 2016-05-24 | Disposition: A | Discharge status: Home / Self Care | Payer: BC Managed Care – PPO | Source: Ambulatory Visit | Attending: Orthopedic Surgery | Admitting: Orthopedic Surgery

## 2016-05-24 DIAGNOSIS — M4802 Spinal stenosis, cervical region: Secondary | ICD-10-CM | POA: Diagnosis not present

## 2016-05-24 DIAGNOSIS — M5412 Radiculopathy, cervical region: Secondary | ICD-10-CM

## 2016-05-24 DIAGNOSIS — R938 Abnormal findings on diagnostic imaging of other specified body structures: Secondary | ICD-10-CM | POA: Diagnosis not present

## 2016-05-24 DIAGNOSIS — Z981 Arthrodesis status: Secondary | ICD-10-CM | POA: Insufficient documentation

## 2016-05-31 ENCOUNTER — Ambulatory Visit (INDEPENDENT_AMBULATORY_CARE_PROVIDER_SITE_OTHER): Payer: BC Managed Care – PPO | Admitting: Psychiatry

## 2016-05-31 ENCOUNTER — Encounter: Payer: Self-pay | Admitting: Psychiatry

## 2016-05-31 VITALS — BP 108/70 | HR 101 | Ht 64.0 in | Wt 165.4 lb

## 2016-05-31 DIAGNOSIS — F3130 Bipolar disorder, current episode depressed, mild or moderate severity, unspecified: Secondary | ICD-10-CM

## 2016-05-31 MED ORDER — QUETIAPINE FUMARATE 100 MG PO TABS: 100.0000 mg | ORAL_TABLET | Freq: Every day | ORAL | 1 refills | Status: DC

## 2016-05-31 MED ORDER — DESVENLAFAXINE SUCCINATE ER 50 MG PO TB24: 100.0000 mg | ORAL_TABLET | Freq: Every day | ORAL | 1 refills | Status: DC

## 2016-05-31 NOTE — Progress Notes (Signed)
Patient ID: Kaylee Gordon, female   DOB: [birth year], 55 y.o.   MRN: [id number]    [facility] MD/PA/NP OP Progress Note  05/31/2016 4:05 PM Kaylee Gordon  MRN:  [id number]  Subjective:  Patient returns for follow-up of anxiety and major depression. Reports her mood is good overall. She is enjoying her new job. States that her son has moved to [place] and she misses him a lot. However she is excited that her children are coming home for Thanksgiving. Reports things are going well at work. Continues to have stressful relationship with husband. But this is a chronic issue. Sleeping well and eating well. 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 She is following up with the neurosurgeon. She also had an EKG done due to elevated heart rate and states that they haven't found anything abnormal. She will be following up with that in December.  Chief Complaint: doing better  Visit Diagnosis:     ICD-9-CM ICD-10-CM   1. Bipolar I disorder, most recent episode depressed ([place]) 296.50 F31.30     Past Medical History:  Past Medical History:  Diagnosis Date  . Anxiety   . Asthma   . Cancer [facility])    SKIN CANCER  . CHF (congestive heart failure) ([place])   . Depression   . Hypertension   . Thyroid disease     Past Surgical History:  Procedure Laterality Date  . ANKLE SURGERY Right 2012  . APPENDECTOMY    . BREAST ENHANCEMENT SURGERY    . COLONOSCOPY WITH PROPOFOL N/A 02/22/2015   Procedure: COLONOSCOPY WITH PROPOFOL;  Surgeon: Josefine Class, MD;  Location: [facility] ENDOSCOPY;  Service: Endoscopy;  Laterality: N/A;  . ESOPHAGOGASTRODUODENOSCOPY (EGD) WITH PROPOFOL N/A 02/22/2015   Procedure: ESOPHAGOGASTRODUODENOSCOPY (EGD) WITH PROPOFOL;  Surgeon: Josefine Class, MD;  Location: [facility] ENDOSCOPY;  Service: Endoscopy;  Laterality: N/A;  . ESOPHAGOGASTRODUODENOSCOPY (EGD) WITH PROPOFOL N/A 02/11/2016   Procedure: ESOPHAGOGASTRODUODENOSCOPY (EGD) WITH  PROPOFOL;  Surgeon: Manya Silvas, MD;  Location: [facility] ENDOSCOPY;  Service: Endoscopy;  Laterality: N/A;  . HEMORRHOID SURGERY    . NASAL SINUS SURGERY    . SAVORY DILATION N/A 02/22/2015   Procedure: SAVORY DILATION;  Surgeon: Josefine Class, MD;  Location: [facility] ENDOSCOPY;  Service: Endoscopy;  Laterality: N/A;  . SAVORY DILATION N/A 02/11/2016   Procedure: SAVORY DILATION;  Surgeon: Manya Silvas, MD;  Location: [facility] ENDOSCOPY;  Service: Endoscopy;  Laterality: N/A;  . SKIN CANCER EXCISION     Family History:  Family History  Problem Relation Age of Onset  . Cancer Mother   . Depression Mother   . Anxiety disorder Mother   . Heart disease Father   . Cancer Father   . Anxiety disorder Father   . Depression Father   . Hypoparathyroidism Son    Social History:  Social History   Social History  . Marital status: Married    Spouse name: N/A  . Number of children: N/A  . Years of education: N/A   Social History Main Topics  . Smoking status: Never Smoker  . Smokeless tobacco: Never Used  . Alcohol use 0.0 oz/week     Comment: MAYBE ONCE OR TWICE A MONTH  . Drug use: No  . Sexual activity: No   Other Topics Concern  . Not on file   Social History Narrative  . No narrative on file   Additional History:  Musculoskeletal: Strength & Muscle Tone: within normal limits Gait & Station: normal Patient leans: N/A  Psychiatric Specialty Exam: Medication Refill   Anxiety  Patient reports no insomnia, nervous/anxious behavior or suicidal ideas.    Depression         Associated symptoms include does not have insomnia and no suicidal ideas.  Past medical history includes anxiety.   Insomnia  PMH includes: no depression.    Review of Systems  Psychiatric/Behavioral: Negative for depression, hallucinations, memory loss, substance abuse and suicidal ideas. The patient is not nervous/anxious and does not have insomnia.   All other systems reviewed and are  negative.   There were no vitals taken for this visit.There is no height or weight on file to calculate BMI.  General Appearance: Neat and Well Groomed  Eye Contact:  Good  Speech:  Clear and Coherent and Normal Rate  Volume:  Normal  Mood:  Improved significantly  Affect:  improved  Thought Process:  Circumstantial  Orientation:  Full (Time, Place, and Person)  Thought Content:  Negative  Suicidal Thoughts:  No  Homicidal Thoughts:  No  Memory:  Immediate;   Good Recent;   Good Remote;   Good  Judgement:  Good  Insight:  Good  Psychomotor Activity:  Negative and Normal  Concentration:  Good  Recall:  Good  Fund of Knowledge: Good  Language: Good  Akathisia:  Negative  Handed:  Right  AIMS (if indicated):  Not done  Assets:  Communication Skills Desire for Improvement Vocational/Educational  ADL's:  Intact  Cognition: WNL  Sleep:  Sleeping well    Is the patient at risk to self?  No. Has the patient been a risk to self in the past 6 months?  No. Has the patient been a risk to self within the distant past?  No. Is the patient a risk to others?  No. Has the patient been a risk to others in the past 6 months?  No. Has the patient been a risk to others within the distant past?  No.  Current Medications: Current Outpatient Prescriptions  Medication Sig Dispense Refill  . albuterol (PROVENTIL, VENTOLIN) (5 MG/ML) 0.5% NEBU Inhale into the lungs. Reported on 07/07/2015    . calcitRIOL (ROCALTROL) 0.25 MCG capsule TAKE 1 CAPSULE BY MOUTH DAILY. 30 capsule 0  . calcitRIOL (ROCALTROL) 0.25 MCG capsule TAKE 1 CAPSULE BY MOUTH DAILY. 30 capsule 3  . calcitRIOL (ROCALTROL) 0.25 MCG capsule TAKE 1 CAPSULE BY MOUTH DAILY. 30 capsule 0  . calcitRIOL (ROCALTROL) 0.25 MCG capsule TAKE 1 CAPSULE BY MOUTH DAILY. 30 capsule 0  . calcitRIOL (ROCALTROL) 0.25 MCG capsule TAKE 1 CAPSULE BY MOUTH DAILY. 30 capsule 3  . celecoxib (CELEBREX) 200 MG capsule Take 200 mg by mouth 2 (two) times  daily.    Marland Kitchen desvenlafaxine (PRISTIQ) 50 MG 24 hr tablet Take 2 tablets (100 mg total) by mouth daily. 60 tablet 1  . dicyclomine (BENTYL) 10 MG capsule Take by mouth. Reported on 01/26/2016    . esomeprazole (NEXIUM) 40 MG capsule Take by mouth.    . fluticasone (FLONASE) 50 MCG/ACT nasal spray     . levothyroxine (SYNTHROID, LEVOTHROID) 50 MCG tablet Take 1 tablet (50 mcg total) by mouth once a week. Take by mouth; add a half a tablet twice a week. 90 tablet 0  . lisinopril (PRINIVIL,ZESTRIL) 5 MG tablet TAKE 1 TABLET BY MOUTH ONCE A DAY    . lovastatin (MEVACOR) 20 MG tablet Take 20 mg  by mouth at bedtime.    . montelukast (SINGULAIR) 10 MG tablet Take 10 mg by mouth at bedtime.    Marland Kitchen omeprazole (PRILOSEC) 40 MG capsule     . PROVENTIL HFA 108 (90 BASE) MCG/ACT inhaler     . QUEtiapine (SEROQUEL) 100 MG tablet Take 1 tablet (100 mg total) by mouth at bedtime. 30 tablet 1  . ZIANA gel APPLY TO AFFECTED AREA ON THE SKIN AT BEDTIME  3  . ZOSTAVAX 13086 UNT/0.65ML injection      No current facility-administered medications for this visit.     Medical Decision Making:  Established Problem, Stable/Improving (1)  Treatment Plan Summary:Medication management Major depressive disorder- Continue Pristiq at 100 mg in the morning.   Continue  Seroquel  100 mg at bedtime.   Generalized anxiety disorder- Same  As above.  She'll follow-up in 2 months.  She's been encouraged to call with questions concerns prior to her next appointment.  Curtistine Pettitt 05/31/2016, 4:05 PM

## 2016-08-01 ENCOUNTER — Other Ambulatory Visit: Payer: Self-pay | Admitting: Psychiatry

## 2016-08-09 ENCOUNTER — Ambulatory Visit: Payer: BC Managed Care – PPO | Admitting: Psychiatry

## 2016-08-23 ENCOUNTER — Telehealth: Payer: Self-pay

## 2016-08-23 NOTE — Telephone Encounter (Signed)
left message on doctor's line given the ok to refill the pristiq and seroquel with no additional refills.

## 2016-08-23 NOTE — Telephone Encounter (Signed)
pt called left message that shen needed refills on her medications she will not have enough medications to last until appt

## 2016-08-24 ENCOUNTER — Encounter (INDEPENDENT_AMBULATORY_CARE_PROVIDER_SITE_OTHER): Payer: Self-pay | Admitting: Vascular Surgery

## 2016-08-28 ENCOUNTER — Ambulatory Visit: Payer: BC Managed Care – PPO | Admitting: Psychiatry

## 2016-08-28 ENCOUNTER — Ambulatory Visit
Admission: RE | Admit: 2016-08-28 | Discharge: 2016-08-28 | Disposition: A | Discharge status: Home / Self Care | Payer: BC Managed Care – PPO | Source: Ambulatory Visit | Attending: Unknown Physician Specialty | Admitting: Unknown Physician Specialty

## 2016-08-28 ENCOUNTER — Other Ambulatory Visit: Payer: Self-pay | Admitting: Unknown Physician Specialty

## 2016-08-28 DIAGNOSIS — R05 Cough: Secondary | ICD-10-CM | POA: Diagnosis present

## 2016-08-28 DIAGNOSIS — R059 Cough, unspecified: Secondary | ICD-10-CM

## 2016-08-28 DIAGNOSIS — R918 Other nonspecific abnormal finding of lung field: Secondary | ICD-10-CM | POA: Insufficient documentation

## 2016-08-31 ENCOUNTER — Other Ambulatory Visit: Payer: Self-pay | Admitting: Endocrinology

## 2016-09-20 ENCOUNTER — Ambulatory Visit (INDEPENDENT_AMBULATORY_CARE_PROVIDER_SITE_OTHER): Payer: BC Managed Care – PPO | Admitting: Psychiatry

## 2016-09-20 ENCOUNTER — Encounter: Payer: Self-pay | Admitting: Psychiatry

## 2016-09-20 VITALS — BP 138/85 | HR 102 | Temp 98.2°F | Wt 175.2 lb

## 2016-09-20 DIAGNOSIS — F411 Generalized anxiety disorder: Secondary | ICD-10-CM

## 2016-09-20 DIAGNOSIS — F3130 Bipolar disorder, current episode depressed, mild or moderate severity, unspecified: Secondary | ICD-10-CM

## 2016-09-20 DIAGNOSIS — F331 Major depressive disorder, recurrent, moderate: Secondary | ICD-10-CM

## 2016-09-20 MED ORDER — QUETIAPINE FUMARATE 100 MG PO TABS: 100.0000 mg | ORAL_TABLET | Freq: Every day | ORAL | 2 refills | Status: DC

## 2016-09-20 MED ORDER — DESVENLAFAXINE SUCCINATE ER 50 MG PO TB24: 100.0000 mg | ORAL_TABLET | Freq: Every day | ORAL | 2 refills | Status: DC

## 2016-09-20 NOTE — Progress Notes (Signed)
Patient ID: Kaylee Gordon, female   DOB: Nov 29, 1960, 56 y.o.   MRN: [id number]    [facility] MD/PA/NP OP Progress Note  09/20/2016 3:51 PM Kaylee Gordon  MRN:  [id number]  Subjective:  Patient returns for follow-up of anxiety and major depression. Reports her mood is okay overall. States she feels tired on week days. On weekends she enjoys spending time with her daughter who is  At [facility] state. She enjoys those times. Fair sleep and appetite. Has some days where she gets depressed. Overall she enjoys her current job. States her husband is trying to make their marriage work.  Chief Complaint: doing well  Visit Diagnosis:     ICD-9-CM ICD-10-CM   1. Bipolar I disorder, most recent episode depressed ([place]) 296.50 F31.30   2. GAD (generalized anxiety disorder) 300.02 F41.1   3. Major depressive disorder, recurrent episode, moderate (HCC) 296.32 F33.1     Past Medical History:  Past Medical History:  Diagnosis Date  . Anxiety   . Asthma   . Cancer [facility])    SKIN CANCER  . CHF (congestive heart failure) ([place])   . Depression   . Hypertension   . Thyroid disease     Past Surgical History:  Procedure Laterality Date  . ANKLE SURGERY Right 2012  . APPENDECTOMY    . BREAST ENHANCEMENT SURGERY    . COLONOSCOPY WITH PROPOFOL N/A 02/22/2015   Procedure: COLONOSCOPY WITH PROPOFOL;  Surgeon: Josefine Class, MD;  Location: [facility] ENDOSCOPY;  Service: Endoscopy;  Laterality: N/A;  . ESOPHAGOGASTRODUODENOSCOPY (EGD) WITH PROPOFOL N/A 02/22/2015   Procedure: ESOPHAGOGASTRODUODENOSCOPY (EGD) WITH PROPOFOL;  Surgeon: Josefine Class, MD;  Location: [facility] ENDOSCOPY;  Service: Endoscopy;  Laterality: N/A;  . ESOPHAGOGASTRODUODENOSCOPY (EGD) WITH PROPOFOL N/A 02/11/2016   Procedure: ESOPHAGOGASTRODUODENOSCOPY (EGD) WITH PROPOFOL;  Surgeon: Manya Silvas, MD;  Location[facility] ENDOSCOPY;  Service: Endoscopy;  Laterality: N/A;  . HEMORRHOID SURGERY    . NASAL SINUS SURGERY    . SAVORY DILATION N/A 02/22/2015    Procedure: SAVORY DILATION;  Surgeon: Josefine Class, MD;  Location: [facility] ENDOSCOPY;  Service: Endoscopy;  Laterality: N/A;  . SAVORY DILATION N/A 02/11/2016   Procedure: SAVORY DILATION;  Surgeon: Manya Silvas, MD;  Location: [facility] ENDOSCOPY;  Service: Endoscopy;  Laterality: N/A;  . SKIN CANCER EXCISION     Family History:  Family History  Problem Relation Age of Onset  . Cancer Mother   . Depression Mother   . Anxiety disorder Mother   . Heart disease Father   . Cancer Father   . Anxiety disorder Father   . Depression Father   . Hypoparathyroidism Son    Social History:  Social History   Social History  . Marital status: Married    Spouse name: N/A  . Number of children: N/A  . Years of education: N/A   Social History Main Topics  . Smoking status: Never Smoker  . Smokeless tobacco: Never Used  . Alcohol use No     Comment: MAYBE ONCE OR TWICE A MONTH  . Drug use: No  . Sexual activity: Not Currently    Partners: Male   Other Topics Concern  . Not on file   Social History Narrative  . No narrative on file   Additional History:   Musculoskeletal: Strength & Muscle Tone: within normal limits Gait & Station: normal Patient leans: N/A  Psychiatric Specialty Exam: Medication Refill   Anxiety  Patient reports no insomnia, nervous/anxious behavior or suicidal  ideas.    Depression         Associated symptoms include does not have insomnia and no suicidal ideas.  Past medical history includes anxiety.   Insomnia  PMH includes: no depression.    Review of Systems  Psychiatric/Behavioral: Negative for depression, hallucinations, memory loss, substance abuse and suicidal ideas. The patient is not nervous/anxious and does not have insomnia.   All other systems reviewed and are negative.   There were no vitals taken for this visit.There is no height or weight on file to calculate BMI.  General Appearance: Neat and Well Groomed  Eye Contact:  Good   Speech:  Clear and Coherent and Normal Rate  Volume:  Normal  Mood:  Improved significantly  Affect:  improved  Thought Process:  Circumstantial  Orientation:  Full (Time, Place, and Person)  Thought Content:  Negative  Suicidal Thoughts:  No  Homicidal Thoughts:  No  Memory:  Immediate;   Good Recent;   Good Remote;   Good  Judgement:  Good  Insight:  Good  Psychomotor Activity:  Negative and Normal  Concentration:  Good  Recall:  Good  Fund of Knowledge: Good  Language: Good  Akathisia:  Negative  Handed:  Right  AIMS (if indicated):  Not done  Assets:  Communication Skills Desire for Improvement Vocational/Educational  ADL's:  Intact  Cognition: WNL  Sleep:  Sleeping well    Is the patient at risk to self?  No. Has the patient been a risk to self in the past 6 months?  No. Has the patient been a risk to self within the distant past?  No. Is the patient a risk to others?  No. Has the patient been a risk to others in the past 6 months?  No. Has the patient been a risk to others within the distant past?  No.  Current Medications: Current Outpatient Prescriptions  Medication Sig Dispense Refill  . albuterol (PROVENTIL, VENTOLIN) (5 MG/ML) 0.5% NEBU Inhale into the lungs. Reported on 07/07/2015    . calcitRIOL (ROCALTROL) 0.25 MCG capsule TAKE 1 CAPSULE BY MOUTH DAILY. 30 capsule 0  . calcitRIOL (ROCALTROL) 0.25 MCG capsule TAKE 1 CAPSULE BY MOUTH DAILY. 30 capsule 3  . calcitRIOL (ROCALTROL) 0.25 MCG capsule TAKE 1 CAPSULE BY MOUTH DAILY. 30 capsule 0  . calcitRIOL (ROCALTROL) 0.25 MCG capsule TAKE 1 CAPSULE BY MOUTH DAILY. 30 capsule 0  . calcitRIOL (ROCALTROL) 0.25 MCG capsule TAKE 1 CAPSULE BY MOUTH DAILY. 30 capsule 3  . calcitRIOL (ROCALTROL) 0.25 MCG capsule TAKE 1 CAPSULE BY MOUTH DAILY. 30 capsule 2  . celecoxib (CELEBREX) 200 MG capsule Take 200 mg by mouth 2 (two) times daily.    Marland Kitchen desvenlafaxine (PRISTIQ) 50 MG 24 hr tablet Take 2 tablets (100 mg total) by  mouth daily. 60 tablet 2  . dicyclomine (BENTYL) 10 MG capsule Take by mouth. Reported on 01/26/2016    . esomeprazole (NEXIUM) 40 MG capsule Take by mouth.    . fluticasone (FLONASE) 50 MCG/ACT nasal spray     . levothyroxine (SYNTHROID, LEVOTHROID) 50 MCG tablet Take 1 tablet (50 mcg total) by mouth once a week. Take by mouth; add a half a tablet twice a week. 90 tablet 0  . lisinopril (PRINIVIL,ZESTRIL) 5 MG tablet TAKE 1 TABLET BY MOUTH ONCE A DAY    . lovastatin (MEVACOR) 20 MG tablet Take 20 mg by mouth at bedtime.    . montelukast (SINGULAIR) 10 MG tablet Take 10 mg by  mouth at bedtime.    Marland Kitchen omeprazole (PRILOSEC) 40 MG capsule     . PROVENTIL HFA 108 (90 BASE) MCG/ACT inhaler     . QUEtiapine (SEROQUEL) 100 MG tablet Take 1 tablet (100 mg total) by mouth at bedtime. 30 tablet 2  . ZIANA gel APPLY TO AFFECTED AREA ON THE SKIN AT BEDTIME  3  . ZOSTAVAX 63893 UNT/0.65ML injection      No current facility-administered medications for this visit.     Medical Decision Making:  Established Problem, Stable/Improving (1)  Treatment Plan Summary:Medication management Major depressive disorder- Continue Pristiq at 100 mg in the morning.   Continue  Seroquel  100 mg at bedtime.   Generalized anxiety disorder- Same  As above.  She'll follow-up in 3 months.  She's been encouraged to call with questions concerns prior to her next appointment.  Loyde Orth 09/20/2016, 3:51 PM

## 2016-12-15 ENCOUNTER — Other Ambulatory Visit: Payer: Self-pay | Admitting: Endocrinology

## 2016-12-19 ENCOUNTER — Encounter: Payer: Self-pay | Admitting: Endocrinology

## 2016-12-19 ENCOUNTER — Ambulatory Visit (INDEPENDENT_AMBULATORY_CARE_PROVIDER_SITE_OTHER): Payer: BC Managed Care – PPO | Admitting: Endocrinology

## 2016-12-19 VITALS — BP 122/84 | HR 78 | Ht 64.0 in | Wt 172.0 lb

## 2016-12-19 DIAGNOSIS — E038 Other specified hypothyroidism: Secondary | ICD-10-CM | POA: Diagnosis not present

## 2016-12-19 DIAGNOSIS — E200 Idiopathic hypoparathyroidism: Secondary | ICD-10-CM

## 2016-12-19 LAB — RENAL FUNCTION PANEL
Albumin: 4.7 g/dL (ref 3.5–5.2)
BUN: 21 mg/dL (ref 6–23)
CHLORIDE: 105 meq/L (ref 96–112)
CO2: 26 meq/L (ref 19–32)
Calcium: 9.3 mg/dL (ref 8.4–10.5)
Creatinine, Ser: 1.11 mg/dL (ref 0.40–1.20)
GFR: 53.79 mL/min — AB (ref >60.00)
GLUCOSE: 88 mg/dL (ref 70–99)
POTASSIUM: 4 meq/L (ref 3.5–5.1)
Phosphorus: 4.2 mg/dL (ref 2.3–4.6)
Sodium: 139 mEq/L (ref 135–145)

## 2016-12-19 LAB — TSH: TSH: 2.03 u[IU]/mL (ref 0.35–4.50)

## 2016-12-19 LAB — T4, FREE: Free T4: 0.87 ng/dL (ref 0.60–1.60)

## 2016-12-19 NOTE — Progress Notes (Signed)
Patient ID: Kaylee Gordon, female   DOB: [birth year], 56 y.o.   MRN: [id number]   History of Present Illness:  Chief complaint: Followup of calcium and thyroid   Hypothyroidism:   She was tried on thyroxine supplements with a borderline TSH levels in 2011  However because of complaints of  fatigue on her visit in 10/15 she had thyroid levels done and free T4 was significantly low She was empirically started on 25 g  of levothyroxine Initially she felt less tired with this; however her free T4 continue to be low and her dose has been increased to 75 g since 1/16 when her free T4 was still low at 0.59 She had a low TSH in 11/16 her PCP reduce the levothyroxine dose to 0.5 mg again She has been continuing this dose  She continues to complain about fatigue which is unchanged  Last free T4 is normal but she has not had any follow-up since 7, TSH normal in April at outside lab  Her weight is significantly higher now   Wt Readings from Last 3 Encounters:  12/19/16 172 lb (78 kg)  01/26/16 153 lb (69.4 kg)  09/03/15 159 lb (72.1 kg)    Lab Results  Component Value Date   FREET4 0.72 01/26/2016   FREET4 0.71 07/28/2015   FREET4 0.80 12/31/2014   TSH 2.40 01/26/2016   TSH 2.64 07/22/2014   TSH 1.70 05/06/2014    Hypoparathyroidism    Has had hypocalcemia since she was 56 years old and presented with symptoms of cramping in her hands along with twitching of her face and eyes. 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    No recent symptoms of  tingling in her face, muscle cramping or twitching of muscles. May have leg cramps But usually this goes along with her muscle aches and stiffness  Calcium level has been fairly consistent and was normal in April at outside lab  She is taking calcitriol 0.25 mcg daily and also her calcium 1200 mg. twice a day   Lab Results  Component Value Date   CALCIUM 9.2 01/26/2016   CALCIUM 9.2 09/03/2015   CALCIUM 9.2 07/28/2015   CALCIUM 8.8 12/31/2014   CALCIUM 9.4 08/18/2014       Allergies as of 12/19/2016      Reactions   Atorvastatin    Parafon Forte Dsc [chlorzoxazone]    Pollen Extract    Sudafed [pseudoephedrine Hcl]       Medication List       Accurate as of 12/19/16  3:33 PM. Always use your most recent med list.          calcitRIOL 0.25 MCG capsule Commonly known as:  ROCALTROL TAKE 1 CAPSULE BY MOUTH DAILY.   celecoxib 200 MG capsule Commonly known as:  CELEBREX Take 200 mg by mouth 2 (two) times daily.   desvenlafaxine 50 MG 24 hr tablet Commonly known as:  PRISTIQ Take 2 tablets (100 mg total) by mouth daily.   dicyclomine 10 MG capsule Commonly known as:  BENTYL Take by mouth. Reported on 01/26/2016   esomeprazole 40 MG capsule Commonly known as:  NEXIUM Take by mouth.   fluticasone 50 MCG/ACT nasal spray Commonly known as:  FLONASE   levothyroxine 50 MCG tablet Commonly known as:  SYNTHROID, LEVOTHROID Take 1 tablet (50 mcg total) by mouth once a week. Take by mouth; add a half a tablet twice a week.   lisinopril 5  MG tablet Commonly known as:  PRINIVIL,ZESTRIL TAKE 1 TABLET BY MOUTH ONCE A DAY   lovastatin 20 MG tablet Commonly known as:  MEVACOR Take 20 mg by mouth at bedtime.   meloxicam 7.5 MG tablet Commonly known as:  MOBIC Take 7.5 mg by mouth daily.   montelukast 10 MG tablet Commonly known as:  SINGULAIR Take 10 mg by mouth at bedtime.   omeprazole 40 MG capsule Commonly known as:  PRILOSEC   propranolol 20 MG tablet Commonly known as:  INDERAL Take 20 mg by mouth 2 (two) times daily.   albuterol (5 MG/ML) 0.5% Nebu Commonly known as:  PROVENTIL, VENTOLIN Inhale into the lungs. Reported on 07/07/2015   PROVENTIL HFA 108 (90 Base) MCG/ACT inhaler Generic drug:  albuterol   QUEtiapine 100 MG tablet Commonly known as:  SEROQUEL Take 1 tablet (100 mg total) by mouth at bedtime.        Allergies:  Allergies  Allergen Reactions  . Atorvastatin   . Parafon Forte Dsc [Chlorzoxazone]   . Pollen Extract   . Sudafed [Pseudoephedrine Hcl]     Past Medical History:  Diagnosis Date  . Anxiety   . Asthma   . Cancer [facility])    SKIN CANCER  . CHF (congestive heart failure) ([place])   . Depression   . Hypertension   . Thyroid disease     Past Surgical History:  Procedure Laterality Date  . ANKLE SURGERY Right 2012  . APPENDECTOMY    . BREAST ENHANCEMENT SURGERY    . COLONOSCOPY WITH PROPOFOL N/A 02/22/2015   Procedure: COLONOSCOPY WITH PROPOFOL;  Surgeon: Josefine Class, MD;  Location: [facility] ENDOSCOPY;  Service: Endoscopy;  Laterality: N/A;  . ESOPHAGOGASTRODUODENOSCOPY (EGD) WITH PROPOFOL N/A 02/22/2015   Procedure: ESOPHAGOGASTRODUODENOSCOPY (EGD) WITH PROPOFOL;  Surgeon: Josefine Class, MD;  Location[facility] ENDOSCOPY;  Service: Endoscopy;  Laterality: N/A;  . ESOPHAGOGASTRODUODENOSCOPY (EGD) WITH PROPOFOL N/A 02/11/2016   Procedure: ESOPHAGOGASTRODUODENOSCOPY (EGD) WITH PROPOFOL;  Surgeon: Manya Silvas, MD;  Location[facility] ENDOSCOPY;  Service: Endoscopy;  Laterality: N/A;  . HEMORRHOID SURGERY    . NASAL SINUS SURGERY    . SAVORY DILATION N/A 02/22/2015   Procedure: SAVORY DILATION;  Surgeon: Josefine Class, MD;  Location: [facility] ENDOSCOPY;  Service: Endoscopy;  Laterality: N/A;  . SAVORY DILATION N/A 02/11/2016   Procedure: SAVORY DILATION;  Surgeon: Manya Silvas, MD;  Location: [facility] ENDOSCOPY;  Service: Endoscopy;  Laterality: N/A;  . SKIN CANCER EXCISION      Family History  Problem Relation Age of Onset  . Cancer Mother   . Depression Mother   . Anxiety disorder Mother   . Heart disease Father   . Cancer Father   . Anxiety disorder Father   . Depression Father   . Hypoparathyroidism Son     Social History:  reports that she has never smoked. She has never used smokeless tobacco. She reports that she does not drink alcohol or use  drugs.  Review of Systems -   She has muscle soreness and has been given only nonsteroidal anti-inflammatory drugs She is asking about treatment for fibromyalgia but is going to see her PCP tomorrow also   History of depression, is on Pristiq and Seroquel      EXAM:  BP 122/84   Pulse 78   Ht 5\' 4"  (1.626 m)   Wt 172 lb (78 kg)   SpO2 96%   BMI 29.52 kg/m  Thyroid is not palpable  Biceps  reflexes appear normal  Assessment/Plan:   HYPOTHYROIDISM:  She has secondary hypothyroidism with  previously persistently low free T4 and normal TSH.   She continues to have fatigue which is at least partly related to other problems She has not had a follow-up since last July  Will check her free T4 level and TSH again   HYPOPARATHYROIDISM, idiopathic:  Her calcium has been well controlled current regimen of 0.25 mcg calcitriol and calcium 1200 mg a day She has been very consistent with her medication  Recently doing well subjectively She will have labs checked today Follow-up in 6 months   Kaylee Gordon 12/19/2016, 3:33 PM   ADDENDUM: Calcium 9.3, stable Free T4 normal at 0.87

## 2016-12-19 NOTE — Progress Notes (Signed)
Please call to let patient know that the lab results are normal and no further action needed

## 2016-12-21 ENCOUNTER — Encounter: Payer: Self-pay | Admitting: Psychiatry

## 2016-12-21 ENCOUNTER — Other Ambulatory Visit: Payer: Self-pay | Admitting: Endocrinology

## 2016-12-21 ENCOUNTER — Ambulatory Visit (INDEPENDENT_AMBULATORY_CARE_PROVIDER_SITE_OTHER): Payer: BC Managed Care – PPO | Admitting: Psychiatry

## 2016-12-21 VITALS — BP 126/84 | HR 80 | Temp 98.6°F | Wt 171.0 lb

## 2016-12-21 DIAGNOSIS — F331 Major depressive disorder, recurrent, moderate: Secondary | ICD-10-CM | POA: Diagnosis not present

## 2016-12-21 DIAGNOSIS — F3130 Bipolar disorder, current episode depressed, mild or moderate severity, unspecified: Secondary | ICD-10-CM

## 2016-12-21 DIAGNOSIS — F411 Generalized anxiety disorder: Secondary | ICD-10-CM

## 2016-12-21 DIAGNOSIS — M797 Fibromyalgia: Secondary | ICD-10-CM

## 2016-12-21 MED ORDER — GABAPENTIN 100 MG PO CAPS: 100.0000 mg | ORAL_CAPSULE | Freq: Two times a day (BID) | ORAL | 2 refills | Status: DC

## 2016-12-21 MED ORDER — QUETIAPINE FUMARATE 100 MG PO TABS: 150.0000 mg | ORAL_TABLET | Freq: Every day | ORAL | 1 refills | Status: DC

## 2016-12-21 MED ORDER — DESVENLAFAXINE SUCCINATE ER 50 MG PO TB24: 100.0000 mg | ORAL_TABLET | Freq: Every day | ORAL | 2 refills | Status: DC

## 2016-12-21 NOTE — Progress Notes (Signed)
Patient ID: Kaylee Gordon, female   DOB: 11/15/1960, 56 y.o.   MRN: [id number]    [facility] MD/PA/NP OP Progress Note  12/21/2016 3:54 PM Kaylee Gordon  MRN:  [id number]  Subjective:  Patient returns for follow-up of anxiety and major depression. Reports she has not been doing well. Reports she aches all over. She thinks it could be Fibromyalgia. States she is having trouble walking. States she feels tired on week days. Fair sleep and appetite. Enjoying her work, states her marriage is not doing so great. Denies any suicidal thoughts.  PHQ 9 of 15  Chief Complaint: depressed and in pain  Visit Diagnosis:     ICD-10-CM   1. Bipolar I disorder, most recent episode depressed ([place]) F31.30   2. GAD (generalized anxiety disorder) F41.1   3. Major depressive disorder, recurrent episode, moderate (HCC) F33.1     Past Medical History:  Past Medical History:  Diagnosis Date  . Anxiety   . Asthma   . Cancer [facility])    SKIN CANCER  . CHF (congestive heart failure) ([place])   . Depression   . Hypertension   . Thyroid disease     Past Surgical History:  Procedure Laterality Date  . ANKLE SURGERY Right 2012  . APPENDECTOMY    . BREAST ENHANCEMENT SURGERY    . COLONOSCOPY WITH PROPOFOL N/A 02/22/2015   Procedure: COLONOSCOPY WITH PROPOFOL;  Surgeon: Josefine Class, MD;  Location: [facility] ENDOSCOPY;  Service: Endoscopy;  Laterality: N/A;  . ESOPHAGOGASTRODUODENOSCOPY (EGD) WITH PROPOFOL N/A 02/22/2015   Procedure: ESOPHAGOGASTRODUODENOSCOPY (EGD) WITH PROPOFOL;  Surgeon: Josefine Class, MD;  Location: [facility] ENDOSCOPY;  Service: Endoscopy;  Laterality: N/A;  . ESOPHAGOGASTRODUODENOSCOPY (EGD) WITH PROPOFOL N/A 02/11/2016   Procedure: ESOPHAGOGASTRODUODENOSCOPY (EGD) WITH PROPOFOL;  Surgeon: Manya Silvas, MD;  Location: [facility] ENDOSCOPY;  Service: Endoscopy;  Laterality: N/A;  . HEMORRHOID SURGERY    . NASAL SINUS SURGERY    . SAVORY DILATION N/A 02/22/2015   Procedure: SAVORY DILATION;  Surgeon:  Josefine Class, MD;  Location: [facility] ENDOSCOPY;  Service: Endoscopy;  Laterality: N/A;  . SAVORY DILATION N/A 02/11/2016   Procedure: SAVORY DILATION;  Surgeon: Manya Silvas, MD;  Location: [facility] ENDOSCOPY;  Service: Endoscopy;  Laterality: N/A;  . SKIN CANCER EXCISION     Family History:  Family History  Problem Relation Age of Onset  . Cancer Mother   . Depression Mother   . Anxiety disorder Mother   . Heart disease Father   . Cancer Father   . Anxiety disorder Father   . Depression Father   . Hypoparathyroidism Son    Social History:  Social History   Social History  . Marital status: Married    Spouse name: N/A  . Number of children: N/A  . Years of education: N/A   Social History Main Topics  . Smoking status: Never Smoker  . Smokeless tobacco: Never Used  . Alcohol use No     Comment: MAYBE ONCE OR TWICE A MONTH  . Drug use: No  . Sexual activity: Not Currently    Partners: Male   Other Topics Concern  . Not on file   Social History Narrative  . No narrative on file   Additional History:   Musculoskeletal: Strength & Muscle Tone: within normal limits Gait & Station: normal Patient leans: N/A  Psychiatric Specialty Exam: Medication Refill   Anxiety  Patient reports no insomnia, nervous/anxious behavior or suicidal ideas.    Depression  Associated symptoms include does not have insomnia and no suicidal ideas.  Past medical history includes anxiety.   Insomnia  PMH includes: no depression.    Review of Systems  Psychiatric/Behavioral: Negative for depression, hallucinations, memory loss, substance abuse and suicidal ideas. The patient is not nervous/anxious and does not have insomnia.   All other systems reviewed and are negative.   There were no vitals taken for this visit.There is no height or weight on file to calculate BMI.  General Appearance: Neat and Well Groomed  Eye Contact:  Good  Speech:  Clear and Coherent and Normal Rate   Volume:  Normal  Mood:  Improved significantly  Affect:  improved  Thought Process:  Circumstantial  Orientation:  Full (Time, Place, and Person)  Thought Content:  Negative  Suicidal Thoughts:  No  Homicidal Thoughts:  No  Memory:  Immediate;   Good Recent;   Good Remote;   Good  Judgement:  Good  Insight:  Good  Psychomotor Activity:  Negative and Normal  Concentration:  Good  Recall:  Good  Fund of Knowledge: Good  Language: Good  Akathisia:  Negative  Handed:  Right  AIMS (if indicated):  Not done  Assets:  Communication Skills Desire for Improvement Vocational/Educational  ADL's:  Intact  Cognition: WNL  Sleep:  Not sleepingw ell   Is the patient at risk to self?  No. Has the patient been a risk to self in the past 6 months?  No. Has the patient been a risk to self within the distant past?  No. Is the patient a risk to others?  No. Has the patient been a risk to others in the past 6 months?  No. Has the patient been a risk to others within the distant past?  No.  Current Medications: Current Outpatient Prescriptions  Medication Sig Dispense Refill  . albuterol (PROVENTIL, VENTOLIN) (5 MG/ML) 0.5% NEBU Inhale into the lungs. Reported on 07/07/2015    . calcitRIOL (ROCALTROL) 0.25 MCG capsule TAKE 1 CAPSULE BY MOUTH DAILY. 30 capsule 0  . celecoxib (CELEBREX) 200 MG capsule Take 200 mg by mouth 2 (two) times daily.    Marland Kitchen desvenlafaxine (PRISTIQ) 50 MG 24 hr tablet Take 2 tablets (100 mg total) by mouth daily. 60 tablet 2  . dicyclomine (BENTYL) 10 MG capsule Take by mouth. Reported on 01/26/2016    . esomeprazole (NEXIUM) 40 MG capsule Take by mouth.    . fluticasone (FLONASE) 50 MCG/ACT nasal spray     . levothyroxine (SYNTHROID, LEVOTHROID) 50 MCG tablet Take 1 tablet (50 mcg total) by mouth once a week. Take by mouth; add a half a tablet twice a week. 90 tablet 0  . lisinopril (PRINIVIL,ZESTRIL) 5 MG tablet TAKE 1 TABLET BY MOUTH ONCE A DAY    . lovastatin  (MEVACOR) 20 MG tablet Take 20 mg by mouth at bedtime.    . meloxicam (MOBIC) 7.5 MG tablet Take 7.5 mg by mouth daily.    . montelukast (SINGULAIR) 10 MG tablet Take 10 mg by mouth at bedtime.    Marland Kitchen omeprazole (PRILOSEC) 40 MG capsule     . propranolol (INDERAL) 20 MG tablet Take 20 mg by mouth 2 (two) times daily.    Marland Kitchen PROVENTIL HFA 108 (90 BASE) MCG/ACT inhaler     . QUEtiapine (SEROQUEL) 100 MG tablet Take 1 tablet (100 mg total) by mouth at bedtime. 30 tablet 2   No current facility-administered medications for this visit.  Medical Decision Making:  Established Problem, Stable/Improving (1)  Treatment Plan Summary:Medication management Major depressive disorder- Continue Pristiq at 100 mg in the morning.   Increase Seroquel  To 150 mg at bedtime.   Generalized anxiety disorder- Same  As above. Start gabapentin at 100mg  po bid, it also addresses pain.  She'll follow-up in 1 months.  She's been encouraged to call with questions concerns prior to her next appointment.  Elsie Sakuma 12/21/2016, 3:54 PM

## 2017-01-05 ENCOUNTER — Telehealth: Payer: Self-pay | Admitting: Endocrinology

## 2017-01-05 NOTE — Telephone Encounter (Signed)
She will have to go through her PCP now

## 2017-01-05 NOTE — Telephone Encounter (Signed)
Patient called to advise that she called Rheumatology to get scheduled and the referral that was placed  by Dr. Dwyane Dee will be denied due to the provider not specializing in fibromyalgia anymore. Patient will need a new referral to another location.

## 2017-01-05 NOTE — Telephone Encounter (Signed)
Patient notified via voicemail.

## 2017-01-10 ENCOUNTER — Other Ambulatory Visit: Payer: Self-pay | Admitting: Endocrinology

## 2017-01-10 ENCOUNTER — Other Ambulatory Visit: Payer: Self-pay | Admitting: Psychiatry

## 2017-01-12 ENCOUNTER — Other Ambulatory Visit: Payer: Self-pay | Admitting: Family Medicine

## 2017-01-12 ENCOUNTER — Telehealth: Payer: Self-pay | Admitting: Psychiatry

## 2017-01-12 DIAGNOSIS — Z1231 Encounter for screening mammogram for malignant neoplasm of breast: Secondary | ICD-10-CM

## 2017-01-16 NOTE — Telephone Encounter (Signed)
Please call in the Pristiq

## 2017-01-18 ENCOUNTER — Ambulatory Visit: Payer: BC Managed Care – PPO | Admitting: Psychiatry

## 2017-01-18 ENCOUNTER — Ambulatory Visit (INDEPENDENT_AMBULATORY_CARE_PROVIDER_SITE_OTHER): Payer: BC Managed Care – PPO | Admitting: Psychiatry

## 2017-01-18 ENCOUNTER — Encounter: Payer: Self-pay | Admitting: Psychiatry

## 2017-01-18 VITALS — BP 115/74 | HR 89 | Temp 99.1°F | Wt 170.0 lb

## 2017-01-18 DIAGNOSIS — F411 Generalized anxiety disorder: Secondary | ICD-10-CM

## 2017-01-18 DIAGNOSIS — F3130 Bipolar disorder, current episode depressed, mild or moderate severity, unspecified: Secondary | ICD-10-CM

## 2017-01-18 MED ORDER — DESVENLAFAXINE SUCCINATE ER 50 MG PO TB24: 100.0000 mg | ORAL_TABLET | Freq: Every day | ORAL | 2 refills | Status: DC

## 2017-01-18 MED ORDER — QUETIAPINE FUMARATE 100 MG PO TABS: 150.0000 mg | ORAL_TABLET | Freq: Every day | ORAL | 1 refills | Status: DC

## 2017-01-18 NOTE — Progress Notes (Unsigned)
Patient ID: Kaylee Gordon, female   DOB: August 17, 1960, 56 y.o.   MRN: [id number]    [facility] MD/PA/NP OP Progress Note  01/23/2017 12:59 PM Kaylee Gordon  MRN:  [id number]  Subjective:  Patient returns for follow-up of anxiety and major depression. Reports she continues to hurt all over. She states the chiropracter makes it worse. The increased dose of gabapentin has not been helpful. Her 56 yo and 56 yo have been causing a lot of stress. She reports that she has been taking the Pristiq at 150 mg. We discussed that she did not do well at this dose and that we did not increase her dose and she should be taking the 100 mg. Patient is okay with this and states that this was done in the mistake. She continues to report that her pain has gotten worse and she is good to be seeing a rheumatologist next week. Denies any suicidal thoughts.  Chief Complaint: depressed and in pain Chief Complaint    Follow-up; Medication Refill     Visit Diagnosis:     ICD-10-CM   1. Bipolar I disorder, most recent episode depressed ([place]) F31.30   2. GAD (generalized anxiety disorder) F41.1     Past Medical History:  Past Medical History:  Diagnosis Date  . Anxiety   . Asthma   . Cancer [facility])    SKIN CANCER  . CHF (congestive heart failure) ([place])   . Depression   . Hypertension   . Thyroid disease     Past Surgical History:  Procedure Laterality Date  . ANKLE SURGERY Right 2012  . APPENDECTOMY    . BREAST ENHANCEMENT SURGERY    . COLONOSCOPY WITH PROPOFOL N/A 02/22/2015   Procedure: COLONOSCOPY WITH PROPOFOL;  Surgeon: Josefine Class, MD;  Location: [facility] ENDOSCOPY;  Service: Endoscopy;  Laterality: N/A;  . ESOPHAGOGASTRODUODENOSCOPY (EGD) WITH PROPOFOL N/A 02/22/2015   Procedure: ESOPHAGOGASTRODUODENOSCOPY (EGD) WITH PROPOFOL;  Surgeon: Josefine Class, MD;  Location: [facility] ENDOSCOPY;  Service: Endoscopy;  Laterality: N/A;  . ESOPHAGOGASTRODUODENOSCOPY (EGD) WITH PROPOFOL N/A 02/11/2016   Procedure:  ESOPHAGOGASTRODUODENOSCOPY (EGD) WITH PROPOFOL;  Surgeon: Manya Silvas, MD;  Location: [facility] ENDOSCOPY;  Service: Endoscopy;  Laterality: N/A;  . HEMORRHOID SURGERY    . NASAL SINUS SURGERY    . SAVORY DILATION N/A 02/22/2015   Procedure: SAVORY DILATION;  Surgeon: Josefine Class, MD;  Location: [facility] ENDOSCOPY;  Service: Endoscopy;  Laterality: N/A;  . SAVORY DILATION N/A 02/11/2016   Procedure: SAVORY DILATION;  Surgeon: Manya Silvas, MD;  Location: [facility] ENDOSCOPY;  Service: Endoscopy;  Laterality: N/A;  . SKIN CANCER EXCISION     Family History:  Family History  Problem Relation Age of Onset  . Cancer Mother   . Depression Mother   . Anxiety disorder Mother   . Heart disease Father   . Cancer Father   . Anxiety disorder Father   . Depression Father   . Hypoparathyroidism Son    Social History:  Social History   Social History  . Marital status: Married    Spouse name: N/A  . Number of children: N/A  . Years of education: N/A   Social History Main Topics  . Smoking status: Never Smoker  . Smokeless tobacco: Never Used  . Alcohol use No     Comment: MAYBE ONCE OR TWICE A MONTH  . Drug use: No  . Sexual activity: Not Currently    Partners: Male   Other Topics Concern  .  None   Social History Narrative  . None   Additional History:   Musculoskeletal: Strength & Muscle Tone: within normal limits Gait & Station: normal Patient leans: N/A  Psychiatric Specialty Exam: Medication Refill   Anxiety  Patient reports no insomnia, nervous/anxious behavior or suicidal ideas.    Depression         Associated symptoms include does not have insomnia and no suicidal ideas.  Past medical history includes anxiety.   Insomnia  PMH includes: no depression.    Review of Systems  Psychiatric/Behavioral: Negative for depression, hallucinations, memory loss, substance abuse and suicidal ideas. The patient is not nervous/anxious and does not have insomnia.   All  other systems reviewed and are negative.   Blood pressure 115/74, pulse 89, temperature 99.1 F (37.3 C), temperature source Oral, weight 170 lb (77.1 kg).Body mass index is 29.18 kg/m.  General Appearance: Neat and Well Groomed  Eye Contact:  Good  Speech:  Clear and Coherent and Normal Rate  Volume:  Normal  Mood:  Improved significantly  Affect:  improved  Thought Process:  Circumstantial  Orientation:  Full (Time, Place, and Person)  Thought Content:  Negative  Suicidal Thoughts:  No  Homicidal Thoughts:  No  Memory:  Immediate;   Good Recent;   Good Remote;   Good  Judgement:  Good  Insight:  Good  Psychomotor Activity:  Negative and Normal  Concentration:  Good  Recall:  Good  Fund of Knowledge: Good  Language: Good  Akathisia:  Negative  Handed:  Right  AIMS (if indicated):  Not done  Assets:  Communication Skills Desire for Improvement Vocational/Educational  ADL's:  Intact  Cognition: WNL  Sleep:  Not sleepingw ell   Is the patient at risk to self?  No. Has the patient been a risk to self in the past 6 months?  No. Has the patient been a risk to self within the distant past?  No. Is the patient a risk to others?  No. Has the patient been a risk to others in the past 6 months?  No. Has the patient been a risk to others within the distant past?  No.  Current Medications: Current Outpatient Prescriptions  Medication Sig Dispense Refill  . albuterol (PROVENTIL, VENTOLIN) (5 MG/ML) 0.5% NEBU Inhale into the lungs. Reported on 07/07/2015    . calcitRIOL (ROCALTROL) 0.25 MCG capsule TAKE 1 CAPSULE BY MOUTH DAILY. 30 capsule 0  . calcitRIOL (ROCALTROL) 0.25 MCG capsule TAKE ONE CAPSULE BY MOUTH DAILY. ***PER DOCTOR'S OFFICE...NO FURTHER REFILLS UNLESS SEEN IN OFFICE FOR AN APPOINTMENT*** 30 capsule 0  . celecoxib (CELEBREX) 200 MG capsule Take 200 mg by mouth 2 (two) times daily.    Marland Kitchen desvenlafaxine (PRISTIQ) 50 MG 24 hr tablet Take 2 tablets (100 mg total) by mouth  daily. 60 tablet 2  . esomeprazole (NEXIUM) 40 MG capsule Take by mouth.    . fluticasone (FLONASE) 50 MCG/ACT nasal spray     . gabapentin (NEURONTIN) 100 MG capsule Take 1 capsule (100 mg total) by mouth 2 (two) times daily. 60 capsule 2  . levothyroxine (SYNTHROID, LEVOTHROID) 50 MCG tablet Take 1 tablet (50 mcg total) by mouth once a week. Take by mouth; add a half a tablet twice a week. 90 tablet 0  . lisinopril (PRINIVIL,ZESTRIL) 5 MG tablet TAKE 1 TABLET BY MOUTH ONCE A DAY    . lovastatin (MEVACOR) 20 MG tablet Take 20 mg by mouth at bedtime.    . meloxicam (  MOBIC) 7.5 MG tablet Take 7.5 mg by mouth daily.    . montelukast (SINGULAIR) 10 MG tablet Take 10 mg by mouth at bedtime.    Marland Kitchen omeprazole (PRILOSEC) 40 MG capsule     . propranolol (INDERAL) 20 MG tablet Take 20 mg by mouth 2 (two) times daily.    Marland Kitchen PROVENTIL HFA 108 (90 BASE) MCG/ACT inhaler     . QUEtiapine (SEROQUEL) 100 MG tablet Take 1.5 tablets (150 mg total) by mouth at bedtime. 45 tablet 1  . dicyclomine (BENTYL) 10 MG capsule Take by mouth. Reported on 01/26/2016     No current facility-administered medications for this visit.     Medical Decision Making:  medium  Treatment Plan Summary:Medication management Major depressive disorder- Continue Pristiq at 100 mg in the morning.   Continue Seroquel To 150 mg at bedtime.   Generalized anxiety disorder- Same  As above.  Start therapy with Miguel Dibble. Patient was shown how to do some deep breathing exercises and meditation to help with her anxiety  She'll follow-up in 1 months.  She's been encouraged to call with questions concerns prior to her next appointment.  Dailey Alberson 01/23/2017, 12:59 PM

## 2017-02-07 ENCOUNTER — Other Ambulatory Visit: Payer: Self-pay | Admitting: Endocrinology

## 2017-02-15 ENCOUNTER — Ambulatory Visit: Payer: BC Managed Care – PPO | Admitting: Psychiatry

## 2017-02-21 ENCOUNTER — Ambulatory Visit
Admission: RE | Admit: 2017-02-21 | Discharge: 2017-02-21 | Disposition: A | Discharge status: Home / Self Care | Payer: BC Managed Care – PPO | Source: Ambulatory Visit | Attending: Family Medicine | Admitting: Family Medicine

## 2017-02-21 DIAGNOSIS — Z1231 Encounter for screening mammogram for malignant neoplasm of breast: Secondary | ICD-10-CM | POA: Insufficient documentation

## 2017-02-21 DIAGNOSIS — Z9882 Breast implant status: Secondary | ICD-10-CM | POA: Diagnosis not present

## 2017-02-27 ENCOUNTER — Ambulatory Visit: Payer: BC Managed Care – PPO | Admitting: Psychiatry

## 2017-03-01 ENCOUNTER — Ambulatory Visit: Payer: BC Managed Care – PPO | Admitting: Psychiatry

## 2017-03-01 ENCOUNTER — Encounter: Payer: Self-pay | Admitting: Psychiatry

## 2017-03-01 VITALS — BP 143/79 | HR 97 | Temp 98.8°F

## 2017-03-01 DIAGNOSIS — F3130 Bipolar disorder, current episode depressed, mild or moderate severity, unspecified: Secondary | ICD-10-CM

## 2017-03-01 DIAGNOSIS — F411 Generalized anxiety disorder: Secondary | ICD-10-CM

## 2017-03-01 MED ORDER — QUETIAPINE FUMARATE 100 MG PO TABS: 150.0000 mg | ORAL_TABLET | Freq: Every day | ORAL | 1 refills | Status: DC

## 2017-03-01 NOTE — Progress Notes (Unsigned)
Patient ID: Kaylee Gordon, female   DOB: [birth year], 56 y.o.   MRN: [id number]    [facility] MD/PA/NP OP Progress Note  03/01/2017 4:17 PM Kaylee Gordon  MRN:  [id number]  Subjective:  Patient returns for follow-up of anxiety and major depression. 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 States that she is looking to retire this December. She is also looking to increase her gabapentin and will check with her primary care physician about that. Reports she continues to hurt all over.  Denies any suicidal thoughts.  Chief Complaint: pain and stress  Chief Complaint    Follow-up; Medication Refill     Visit Diagnosis:     ICD-10-CM   1. Bipolar I disorder, most recent episode depressed ([place]) F31.30   2. GAD (generalized anxiety disorder) F41.1     Past Medical History:  Past Medical History:  Diagnosis Date  . Anxiety   . Asthma   . Cancer [facility])    SKIN CANCER  . CHF (congestive heart failure) ([place])   . Depression   . Hypertension   . Thyroid disease     Past Surgical History:  Procedure Laterality Date  . ANKLE SURGERY Right 2012  . APPENDECTOMY    . AUGMENTATION MAMMAPLASTY Bilateral   . BREAST ENHANCEMENT SURGERY    . COLONOSCOPY WITH PROPOFOL N/A 02/22/2015   Procedure: COLONOSCOPY WITH PROPOFOL;  Surgeon: Josefine Class, MD;  Location[facility] ENDOSCOPY;  Service: Endoscopy;  Laterality: N/A;  . ESOPHAGOGASTRODUODENOSCOPY (EGD) WITH PROPOFOL N/A 02/22/2015   Procedure: ESOPHAGOGASTRODUODENOSCOPY (EGD) WITH PROPOFOL;  Surgeon: Josefine Class, MD;  Location: [facility] ENDOSCOPY;  Service: Endoscopy;  Laterality: N/A;  . ESOPHAGOGASTRODUODENOSCOPY (EGD) WITH PROPOFOL N/A 02/11/2016   Procedure: ESOPHAGOGASTRODUODENOSCOPY (EGD) WITH PROPOFOL;  Surgeon: Manya Silvas, MD;  Location: [facility] ENDOSCOPY;  Service: Endoscopy;  Laterality: N/A;  . HEMORRHOID SURGERY    . NASAL SINUS SURGERY    .  SAVORY DILATION N/A 02/22/2015   Procedure: SAVORY DILATION;  Surgeon: Josefine Class, MD;  Location: [facility] ENDOSCOPY;  Service: Endoscopy;  Laterality: N/A;  . SAVORY DILATION N/A 02/11/2016   Procedure: SAVORY DILATION;  Surgeon: Manya Silvas, MD;  Location: [facility] ENDOSCOPY;  Service: Endoscopy;  Laterality: N/A;  . SKIN CANCER EXCISION     Family History:  Family History  Problem Relation Age of Onset  . Cancer Mother   . Depression Mother   . Anxiety disorder Mother   . Heart disease Father   . Cancer Father   . Anxiety disorder Father   . Depression Father   . Hypoparathyroidism Son    Social History:  Social History   Social History  . Marital status: Married    Spouse name: N/A  . Number of children: N/A  . Years of education: N/A   Social History Main Topics  . Smoking status: Never Smoker  . Smokeless tobacco: Never Used  . Alcohol use No     Comment: MAYBE ONCE OR TWICE A MONTH  . Drug use: No  . Sexual activity: Not Currently    Partners: Male   Other Topics Concern  . None   Social History Narrative  . None   Additional History:   Musculoskeletal: Strength & Muscle Tone: within normal limits Gait & Station: normal Patient leans: N/A  Psychiatric Specialty Exam: Medication Refill   Anxiety  Patient reports no insomnia, nervous/anxious behavior or  suicidal ideas.    Depression         Associated symptoms include does not have insomnia and no suicidal ideas.  Past medical history includes anxiety.   Insomnia  PMH includes: no depression.    Review of Systems  Psychiatric/Behavioral: Negative for depression, hallucinations, memory loss, substance abuse and suicidal ideas. The patient is not nervous/anxious and does not have insomnia.   All other systems reviewed and are negative.   Blood pressure (!) 143/79, pulse 97, temperature 98.8 F (37.1 C), temperature source Oral.There is no height or weight on file to calculate BMI.  General  Appearance: Neat and Well Groomed  Eye Contact:  Good  Speech:  Clear and Coherent and Normal Rate  Volume:  Normal  Mood:  Doing ok  Affect:  improved  Thought Process:  Circumstantial  Orientation:  Full (Time, Place, and Person)  Thought Content:  Negative  Suicidal Thoughts:  No  Homicidal Thoughts:  No  Memory:  Immediate;   Good Recent;   Good Remote;   Good  Judgement:  Good  Insight:  Good  Psychomotor Activity:  Negative and Normal  Concentration:  Good  Recall:  Good  Fund of Knowledge: Good  Language: Good  Akathisia:  Negative  Handed:  Right  AIMS (if indicated):  Not done  Assets:  Communication Skills Desire for Improvement Vocational/Educational  ADL's:  Intact  Cognition: WNL  Sleep:  Not sleepingw ell   Is the patient at risk to self?  No. Has the patient been a risk to self in the past 6 months?  No. Has the patient been a risk to self within the distant past?  No. Is the patient a risk to others?  No. Has the patient been a risk to others in the past 6 months?  No. Has the patient been a risk to others within the distant past?  No.  Current Medications: Current Outpatient Prescriptions  Medication Sig Dispense Refill  . albuterol (PROVENTIL, VENTOLIN) (5 MG/ML) 0.5% NEBU Inhale into the lungs. Reported on 07/07/2015    . calcitRIOL (ROCALTROL) 0.25 MCG capsule TAKE 1 CAPSULE BY MOUTH DAILY. 30 capsule 0  . calcitRIOL (ROCALTROL) 0.25 MCG capsule TAKE ONE CAPSULE BY MOUTH DAILY. ***PER DOCTOR'S OFFICE...NO FURTHER REFILLS UNLESS SEEN IN OFFICE FOR AN APPOINTMENT*** 30 capsule 0  . calcitRIOL (ROCALTROL) 0.25 MCG capsule TAKE ONE CAPSULE BY MOUTH DAILY. ***PER DOCTOR'S OFFICE...NO FURTHER REFILLS UNLESS SEEN IN OFFICE FOR AN APPOINTMENT*** 30 capsule 0  . celecoxib (CELEBREX) 200 MG capsule Take 200 mg by mouth 2 (two) times daily.    Marland Kitchen desvenlafaxine (PRISTIQ) 50 MG 24 hr tablet Take 2 tablets (100 mg total) by mouth daily. 60 tablet 2  . esomeprazole  (NEXIUM) 40 MG capsule Take by mouth.    . fluticasone (FLONASE) 50 MCG/ACT nasal spray     . gabapentin (NEURONTIN) 100 MG capsule Take 1 capsule (100 mg total) by mouth 2 (two) times daily. 60 capsule 2  . levothyroxine (SYNTHROID, LEVOTHROID) 50 MCG tablet Take 1 tablet (50 mcg total) by mouth once a week. Take by mouth; add a half a tablet twice a week. 90 tablet 0  . lisinopril (PRINIVIL,ZESTRIL) 5 MG tablet TAKE 1 TABLET BY MOUTH ONCE A DAY    . lovastatin (MEVACOR) 20 MG tablet Take 20 mg by mouth at bedtime.    . meloxicam (MOBIC) 7.5 MG tablet Take 7.5 mg by mouth daily.    . montelukast (SINGULAIR) 10 MG  tablet Take 10 mg by mouth at bedtime.    Marland Kitchen omeprazole (PRILOSEC) 40 MG capsule     . propranolol (INDERAL) 20 MG tablet Take 20 mg by mouth 2 (two) times daily.    Marland Kitchen PROVENTIL HFA 108 (90 BASE) MCG/ACT inhaler     . QUEtiapine (SEROQUEL) 100 MG tablet Take 1.5 tablets (150 mg total) by mouth at bedtime. 45 tablet 1  . dicyclomine (BENTYL) 10 MG capsule Take by mouth. Reported on 01/26/2016     No current facility-administered medications for this visit.     Medical Decision Making:  Established Problem, Stable/Improving (1)  Treatment Plan Summary: Major depressive disorder- Continue Pristiq at 100 mg in the morning.   Continue Seroquel at 150 mg at bedtime.   Generalized anxiety disorder- Same  As above. Start therapy with Miguel Dibble. Patient was shown how to do some deep breathing exercises and meditation to help with her anxiety Discussed starting yoga to help with the pain  She'll follow-up in 1 months.  She's been encouraged to call with questions concerns prior to her next appointment.  Neyda Durango 03/01/2017, 4:17 PM

## 2017-03-06 ENCOUNTER — Other Ambulatory Visit: Payer: Self-pay | Admitting: Endocrinology

## 2017-03-23 ENCOUNTER — Other Ambulatory Visit: Payer: Self-pay | Admitting: Psychiatry

## 2017-03-28 ENCOUNTER — Ambulatory Visit: Payer: BC Managed Care – PPO | Admitting: Psychiatry

## 2017-04-02 ENCOUNTER — Ambulatory Visit: Payer: BC Managed Care – PPO | Admitting: Psychiatry

## 2017-04-05 ENCOUNTER — Other Ambulatory Visit: Payer: Self-pay | Admitting: Endocrinology

## 2017-04-18 ENCOUNTER — Ambulatory Visit: Payer: BC Managed Care – PPO | Admitting: Psychiatry

## 2017-05-04 ENCOUNTER — Telehealth: Payer: Self-pay | Admitting: Endocrinology

## 2017-05-04 NOTE — Telephone Encounter (Signed)
I have refilled this medication.

## 2017-05-04 NOTE — Telephone Encounter (Signed)
Okay to refill? 

## 2017-05-04 NOTE — Telephone Encounter (Signed)
Please advise if okay to refill? Note on Rx states she needs to be seen for further refills. She has an appointment scheduled on 06/19/2017.

## 2017-05-07 ENCOUNTER — Encounter: Payer: Self-pay | Admitting: Psychiatry

## 2017-05-07 ENCOUNTER — Ambulatory Visit (INDEPENDENT_AMBULATORY_CARE_PROVIDER_SITE_OTHER): Payer: BC Managed Care – PPO | Admitting: Psychiatry

## 2017-05-07 VITALS — BP 114/76 | HR 84 | Temp 98.6°F | Wt 174.4 lb

## 2017-05-07 DIAGNOSIS — F411 Generalized anxiety disorder: Secondary | ICD-10-CM

## 2017-05-07 DIAGNOSIS — F3130 Bipolar disorder, current episode depressed, mild or moderate severity, unspecified: Secondary | ICD-10-CM

## 2017-05-07 MED ORDER — QUETIAPINE FUMARATE 100 MG PO TABS: 150.0000 mg | ORAL_TABLET | Freq: Every day | ORAL | 2 refills | Status: DC

## 2017-05-07 MED ORDER — DESVENLAFAXINE SUCCINATE ER 50 MG PO TB24: 100.0000 mg | ORAL_TABLET | Freq: Every day | ORAL | 2 refills | Status: DC

## 2017-05-07 NOTE — Progress Notes (Unsigned)
Patient ID: Kaylee Gordon, female   DOB: 1960-11-20, 56 y.o.   MRN: [id number]    [facility] MD/PA/NP OP Progress Note  05/07/2017 3:58 PM Lanny AVANTI JETTER  MRN:  [id number]  Subjective:  Patient returns for follow-up of anxiety and major depression. She reports doing better in terms of her mood. 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 Reports having been sick with the throat infection. States that her fibromyalgia continues to bother her quite a bit. Continues to take her Pristiq and Seroquel. States that her doctor was looking into Cymbalta to help with the fibromyalgia. We discussed that if she starts the Cymbalta she will have to wean off the Pristiq first. She is also stressed about her son who appears to have a mood disorder and is not functioning very well. We discussed options for that and recommend that she take him to an academic Center for a thorough evaluation.   Chief Complaint: pain and stress  Chief Complaint    Follow-up; Medication Refill     Visit Diagnosis:     ICD-10-CM   1. Bipolar I disorder, most recent episode depressed ([place]) F31.30   2. GAD (generalized anxiety disorder) F41.1     Past Medical History:  Past Medical History:  Diagnosis Date  . Anxiety   . Asthma   . Cancer [facility])    SKIN CANCER  . CHF (congestive heart failure) ([place])   . Depression   . Hypertension   . Thyroid disease     Past Surgical History:  Procedure Laterality Date  . ANKLE SURGERY Right 2012  . APPENDECTOMY    . AUGMENTATION MAMMAPLASTY Bilateral   . BREAST ENHANCEMENT SURGERY    . COLONOSCOPY WITH PROPOFOL N/A 02/22/2015   Procedure: COLONOSCOPY WITH PROPOFOL;  Surgeon: Josefine Class, MD;  Location: [facility] ENDOSCOPY;  Service: Endoscopy;  Laterality: N/A;  . ESOPHAGOGASTRODUODENOSCOPY (EGD) WITH PROPOFOL N/A 02/22/2015   Procedure: ESOPHAGOGASTRODUODENOSCOPY (EGD) WITH PROPOFOL;  Surgeon: Josefine Class, MD;  Location: [facility]  ENDOSCOPY;  Service: Endoscopy;  Laterality: N/A;  . ESOPHAGOGASTRODUODENOSCOPY (EGD) WITH PROPOFOL N/A 02/11/2016   Procedure: ESOPHAGOGASTRODUODENOSCOPY (EGD) WITH PROPOFOL;  Surgeon: Manya Silvas, MD;  Location: [facility] ENDOSCOPY;  Service: Endoscopy;  Laterality: N/A;  . HEMORRHOID SURGERY    . NASAL SINUS SURGERY    . SAVORY DILATION N/A 02/22/2015   Procedure: SAVORY DILATION;  Surgeon: Josefine Class, MD;  Location: [facility] ENDOSCOPY;  Service: Endoscopy;  Laterality: N/A;  . SAVORY DILATION N/A 02/11/2016   Procedure: SAVORY DILATION;  Surgeon: Manya Silvas, MD;  Location: [facility] ENDOSCOPY;  Service: Endoscopy;  Laterality: N/A;  . SKIN CANCER EXCISION     Family History:  Family History  Problem Relation Age of Onset  . Cancer Mother   . Depression Mother   . Anxiety disorder Mother   . Heart disease Father   . Cancer Father   . Anxiety disorder Father   . Depression Father   . Hypoparathyroidism Son    Social History:  Social History   Social History  . Marital status: Married    Spouse name: N/A  . Number of children: N/A  . Years of education: N/A   Social History Main Topics  . Smoking status: Never Smoker  . Smokeless tobacco: Never Used  . Alcohol use No     Comment: MAYBE ONCE OR TWICE A MONTH  . Drug use: No  . Sexual activity: Not Currently  Partners: Male   Other Topics Concern  . None   Social History Narrative  . None   Additional History:   Musculoskeletal: Strength & Muscle Tone: within normal limits Gait & Station: normal Patient leans: N/A  Psychiatric Specialty Exam: Medication Refill   Anxiety  Patient reports no insomnia, nervous/anxious behavior or suicidal ideas.    Depression         Associated symptoms include does not have insomnia and no suicidal ideas.  Past medical history includes anxiety.   Insomnia  PMH includes: no depression.    Review of Systems  Psychiatric/Behavioral: Negative for depression,  hallucinations, memory loss, substance abuse and suicidal ideas. The patient is not nervous/anxious and does not have insomnia.   All other systems reviewed and are negative.   Blood pressure (!) 173/80, pulse 84, temperature 98.6 F (37 C), temperature source Oral, weight 174 lb 5.8 oz (79.1 kg).Body mass index is 29.93 kg/m.  General Appearance: Neat and Well Groomed  Eye Contact:  Good  Speech:  Clear and Coherent and Normal Rate  Volume:  Normal  Mood:  Doing ok  Affect:  improved  Thought Process:  Circumstantial  Orientation:  Full (Time, Place, and Person)  Thought Content:  Negative  Suicidal Thoughts:  No  Homicidal Thoughts:  No  Memory:  Immediate;   Good Recent;   Good Remote;   Good  Judgement:  Good  Insight:  Good  Psychomotor Activity:  Negative and Normal  Concentration:  Good  Recall:  Good  Fund of Knowledge: Good  Language: Good  Akathisia:  Negative  Handed:  Right  AIMS (if indicated):  Not done  Assets:  Communication Skills Desire for Improvement Vocational/Educational  ADL's:  Intact  Cognition: WNL  Sleep:  Not sleepingw ell   Is the patient at risk to self?  No. Has the patient been a risk to self in the past 6 months?  No. Has the patient been a risk to self within the distant past?  No. Is the patient a risk to others?  No. Has the patient been a risk to others in the past 6 months?  No. Has the patient been a risk to others within the distant past?  No.  Current Medications: Current Outpatient Prescriptions  Medication Sig Dispense Refill  . albuterol (PROVENTIL, VENTOLIN) (5 MG/ML) 0.5% NEBU Inhale into the lungs. Reported on 07/07/2015    . calcitRIOL (ROCALTROL) 0.25 MCG capsule TAKE 1 CAPSULE BY MOUTH DAILY. 30 capsule 0  . calcitRIOL (ROCALTROL) 0.25 MCG capsule TAKE ONE CAPSULE BY MOUTH DAILY. ***PER DOCTOR'S OFFICE...NO FURTHER REFILLS UNLESS SEEN IN OFFICE FOR AN APPOINTMENT*** 30 capsule 0  . calcitRIOL (ROCALTROL) 0.25 MCG  capsule TAKE ONE CAPSULE BY MOUTH DAILY. ***PER DOCTOR'S OFFICE...NO FURTHER REFILLS UNLESS SEEN IN OFFICE FOR AN APPOINTMENT*** 30 capsule 0  . calcitRIOL (ROCALTROL) 0.25 MCG capsule TAKE ONE CAPSULE BY MOUTH DAILY. ***PER DOCTOR'S OFFICE...NO FURTHER REFILLS UNLESS SEEN IN OFFICE FOR AN APPOINTMENT*** 30 capsule 0  . calcitRIOL (ROCALTROL) 0.25 MCG capsule TAKE ONE CAPSULE BY MOUTH DAILY. ***PER DOCTOR'S OFFICE...NO FURTHER REFILLS UNLESS SEEN IN OFFICE FOR AN APPOINTMENT*** 30 capsule 0  . calcitRIOL (ROCALTROL) 0.25 MCG capsule Take 1 capsule (0.25 mcg total) by mouth daily. 30 capsule 0  . celecoxib (CELEBREX) 200 MG capsule Take 200 mg by mouth 2 (two) times daily.    Marland Kitchen desvenlafaxine (PRISTIQ) 50 MG 24 hr tablet Take 2 tablets (100 mg total) by mouth daily. 60 tablet  2  . esomeprazole (NEXIUM) 40 MG capsule Take by mouth.    . fluticasone (FLONASE) 50 MCG/ACT nasal spray     . gabapentin (NEURONTIN) 100 MG capsule Take 1 capsule (100 mg total) by mouth 2 (two) times daily. 60 capsule 2  . levothyroxine (SYNTHROID, LEVOTHROID) 50 MCG tablet Take 1 tablet (50 mcg total) by mouth once a week. Take by mouth; add a half a tablet twice a week. 90 tablet 0  . lisinopril (PRINIVIL,ZESTRIL) 5 MG tablet TAKE 1 TABLET BY MOUTH ONCE A DAY    . lovastatin (MEVACOR) 20 MG tablet Take 20 mg by mouth at bedtime.    . meloxicam (MOBIC) 7.5 MG tablet Take 7.5 mg by mouth daily.    . montelukast (SINGULAIR) 10 MG tablet Take 10 mg by mouth at bedtime.    Marland Kitchen omeprazole (PRILOSEC) 40 MG capsule     . propranolol (INDERAL) 20 MG tablet Take 20 mg by mouth 2 (two) times daily.    Marland Kitchen PROVENTIL HFA 108 (90 BASE) MCG/ACT inhaler     . QUEtiapine (SEROQUEL) 100 MG tablet Take 1.5 tablets (150 mg total) by mouth at bedtime. 45 tablet 1  . dicyclomine (BENTYL) 10 MG capsule Take by mouth. Reported on 01/26/2016     No current facility-administered medications for this visit.     Medical Decision Making:  Established  Problem, Stable/Improving (1)  Treatment Plan Summary: Major depressive disorder- Continue Pristiq at 100 mg in the morning.   Continue Seroquel at 150 mg at bedtime.  If patient decides to switch to Cymbalta she will have to wean off the Pristiq first. She is available of this.  Generalized anxiety disorder- Same  As above.  She'll follow-up in 3 months.  She's been encouraged to call with questions concerns prior to her next appointment.  Amoni Morales 05/07/2017, 3:58 PM

## 2017-05-31 ENCOUNTER — Other Ambulatory Visit: Payer: Self-pay | Admitting: Endocrinology

## 2017-06-19 ENCOUNTER — Ambulatory Visit: Payer: BC Managed Care – PPO | Admitting: Endocrinology

## 2017-06-30 ENCOUNTER — Other Ambulatory Visit: Payer: Self-pay | Admitting: Endocrinology

## 2017-07-25 ENCOUNTER — Other Ambulatory Visit: Payer: Self-pay | Admitting: Endocrinology

## 2017-07-25 ENCOUNTER — Other Ambulatory Visit: Payer: Self-pay | Admitting: Psychiatry

## 2017-08-01 NOTE — Telephone Encounter (Signed)
receved a fax requesting a refill on seroquel and pristiq. pt was last seen on  05-07-17 next appt  08-07-17  desvenlafaxine (PRISTIQ) 50 MG 24 hr tablet 60 tablet 2 05/07/2017    Sig - Route: Take 2 tablets (100 mg total) by mouth daily. - Oral   Sent to pharmacy as: desvenlafaxine (PRISTIQ) 50 MG 24 hr tablet   E-Prescribing Status: Receipt confirmed by pharmacy (05/07/2017 4:09 PM EDT)    QUEtiapine (SEROQUEL) 100 MG tablet 45 tablet 2 05/07/2017    Sig - Route: Take 1.5 tablets (150 mg total) by mouth at bedtime. - Oral   Sent to pharmacy as: QUEtiapine (SEROQUEL) 100 MG tablet   E-Prescribing Status: Receipt confirmed by pharmacy (05/07/2017 4:09 PM EDT)

## 2017-08-07 ENCOUNTER — Ambulatory Visit: Payer: BC Managed Care – PPO | Admitting: Psychiatry

## 2017-08-10 DIAGNOSIS — R7303 Prediabetes: Secondary | ICD-10-CM | POA: Insufficient documentation

## 2017-08-13 ENCOUNTER — Encounter: Payer: Self-pay | Admitting: Psychiatry

## 2017-08-13 ENCOUNTER — Other Ambulatory Visit: Payer: Self-pay

## 2017-08-13 ENCOUNTER — Ambulatory Visit: Payer: BC Managed Care – PPO | Admitting: Psychiatry

## 2017-08-13 VITALS — BP 110/79 | HR 86 | Temp 98.9°F | Wt 180.2 lb

## 2017-08-13 DIAGNOSIS — F3130 Bipolar disorder, current episode depressed, mild or moderate severity, unspecified: Secondary | ICD-10-CM | POA: Diagnosis not present

## 2017-08-13 DIAGNOSIS — F411 Generalized anxiety disorder: Secondary | ICD-10-CM | POA: Diagnosis not present

## 2017-08-13 DIAGNOSIS — F331 Major depressive disorder, recurrent, moderate: Secondary | ICD-10-CM | POA: Diagnosis not present

## 2017-08-13 MED ORDER — DESVENLAFAXINE SUCCINATE ER 50 MG PO TB24: 100.0000 mg | ORAL_TABLET | Freq: Every day | ORAL | 2 refills | Status: DC

## 2017-08-13 MED ORDER — QUETIAPINE FUMARATE 100 MG PO TABS: ORAL_TABLET | ORAL | 1 refills | Status: DC

## 2017-08-13 NOTE — Progress Notes (Signed)
Patient ID: Kaylee Gordon, female   DOB: [birth year], 57 y.o.   MRN: [id number]    [facility] MD/PA/NP OP Progress Note  08/13/2017 1:17 PM Dagmar PAITYNN MIKUS  MRN:  [id number]  Subjective:  Patient returns for follow-up of anxiety and major depression. She reports doing well. She retired in December and is enjoying the free time. She would like to do something part time. Reports that her fibromyalgia prevents her from doing much standing. States she has good days and bad days. Her pain is mainly in her feet. She is sleeping well, her appetite is a bit erratic. Her son has moved back and is doing well. She states it is good for her and for him. Denies any suicidal thoughts.   Chief Complaint: doing better  Chief Complaint    Follow-up; Medication Refill     Visit Diagnosis:     ICD-10-CM   1. Bipolar I disorder, most recent episode depressed ([place]) F31.30   2. GAD (generalized anxiety disorder) F41.1   3. Major depressive disorder, recurrent episode, moderate ([facility]) F33.1     Past Medical History:  Past Medical History:  Diagnosis Date  . Anxiety   . Asthma   . Cancer [facility])    SKIN CANCER  . CHF (congestive heart failure) ([place])   . Depression   . Hypertension   . Thyroid disease     Past Surgical History:  Procedure Laterality Date  . ANKLE SURGERY Right 2012  . APPENDECTOMY    . AUGMENTATION MAMMAPLASTY Bilateral   . BREAST ENHANCEMENT SURGERY    . COLONOSCOPY WITH PROPOFOL N/A 02/22/2015   Procedure: COLONOSCOPY WITH PROPOFOL;  Surgeon: Josefine Class, MD;  Location: [facility] ENDOSCOPY;  Service: Endoscopy;  Laterality: N/A;  . ESOPHAGOGASTRODUODENOSCOPY (EGD) WITH PROPOFOL N/A 02/22/2015   Procedure: ESOPHAGOGASTRODUODENOSCOPY (EGD) WITH PROPOFOL;  Surgeon: Josefine Class, MD;  Location: [facility] ENDOSCOPY;  Service: Endoscopy;  Laterality: N/A;  . ESOPHAGOGASTRODUODENOSCOPY (EGD) WITH PROPOFOL N/A 02/11/2016   Procedure: ESOPHAGOGASTRODUODENOSCOPY (EGD) WITH PROPOFOL;  Surgeon:  Manya Silvas, MD;  Location: [facility] ENDOSCOPY;  Service: Endoscopy;  Laterality: N/A;  . HEMORRHOID SURGERY    . NASAL SINUS SURGERY    . SAVORY DILATION N/A 02/22/2015   Procedure: SAVORY DILATION;  Surgeon: Josefine Class, MD;  Location: [facility] ENDOSCOPY;  Service: Endoscopy;  Laterality: N/A;  . SAVORY DILATION N/A 02/11/2016   Procedure: SAVORY DILATION;  Surgeon: Manya Silvas, MD;  Location: [facility] ENDOSCOPY;  Service: Endoscopy;  Laterality: N/A;  . SKIN CANCER EXCISION     Family History:  Family History  Problem Relation Age of Onset  . Cancer Mother   . Depression Mother   . Anxiety disorder Mother   . Heart disease Father   . Cancer Father   . Anxiety disorder Father   . Depression Father   . Hypoparathyroidism Son    Social History:  Social History   Socioeconomic History  . Marital status: Married    Spouse name: None  . Number of children: None  . Years of education: None  . Highest education level: None  Social Needs  . Financial resource strain: None  . Food insecurity - worry: None  . Food insecurity - inability: None  . Transportation needs - medical: None  . Transportation needs - non-medical: None  Occupational History  . None  Tobacco Use  . Smoking status: Never Smoker  . Smokeless tobacco: Never Used  Substance and Sexual Activity  . Alcohol use: No  Alcohol/week: 0.0 oz    Comment: MAYBE ONCE OR TWICE A MONTH  . Drug use: No  . Sexual activity: Not Currently    Partners: Male  Other Topics Concern  . None  Social History Narrative  . None   Additional History:   Musculoskeletal: Strength & Muscle Tone: within normal limits Gait & Station: normal Patient leans: N/A  Psychiatric Specialty Exam: Medication Refill   Anxiety  Patient reports no insomnia, nervous/anxious behavior or suicidal ideas.    Depression         Associated symptoms include does not have insomnia and no suicidal ideas.  Past medical history includes  anxiety.   Insomnia  PMH includes: no depression.    Review of Systems  Psychiatric/Behavioral: Negative for depression, hallucinations, memory loss, substance abuse and suicidal ideas. The patient is not nervous/anxious and does not have insomnia.   All other systems reviewed and are negative.   Blood pressure 110/79, pulse 86, temperature 98.9 F (37.2 C), temperature source Oral, weight 81.7 kg (180 lb 3.2 oz).Body mass index is 30.93 kg/m.  General Appearance: Neat and Well Groomed  Eye Contact:  Good  Speech:  Clear and Coherent and Normal Rate  Volume:  Normal  Mood:  Doing better  Affect:  improved  Thought Process:  Circumstantial  Orientation:  Full (Time, Place, and Person)  Thought Content:  Negative  Suicidal Thoughts:  No  Homicidal Thoughts:  No  Memory:  Immediate;   Good Recent;   Good Remote;   Good  Judgement:  Good  Insight:  Good  Psychomotor Activity:  Negative and Normal  Concentration:  Good  Recall:  Good  Fund of Knowledge: Good  Language: Good  Akathisia:  Negative  Handed:  Right  AIMS (if indicated):  Not done  Assets:  Communication Skills Desire for Improvement Vocational/Educational  ADL's:  Intact  Cognition: WNL  Sleep:  better   Is the patient at risk to self?  No. Has the patient been a risk to self in the past 6 months?  No. Has the patient been a risk to self within the distant past?  No. Is the patient a risk to others?  No. Has the patient been a risk to others in the past 6 months?  No. Has the patient been a risk to others within the distant past?  No.  Current Medications: Current Outpatient Medications  Medication Sig Dispense Refill  . albuterol (PROVENTIL, VENTOLIN) (5 MG/ML) 0.5% NEBU Inhale into the lungs. Reported on 07/07/2015    . calcitRIOL (ROCALTROL) 0.25 MCG capsule TAKE ONE CAPSULE BY MOUTH DAILY 10 capsule 0  . celecoxib (CELEBREX) 200 MG capsule Take 200 mg by mouth 2 (two) times daily.    Marland Kitchen desvenlafaxine  (PRISTIQ) 50 MG 24 hr tablet Take 2 tablets (100 mg total) by mouth daily. 60 tablet 2  . desvenlafaxine (PRISTIQ) 50 MG 24 hr tablet TAKE TWO TABLETS BY MOUTH DAILY 60 tablet 1  . esomeprazole (NEXIUM) 40 MG capsule Take by mouth.    . fluticasone (FLONASE) 50 MCG/ACT nasal spray     . gabapentin (NEURONTIN) 100 MG capsule Take 1 capsule (100 mg total) by mouth 2 (two) times daily. 60 capsule 2  . levothyroxine (SYNTHROID, LEVOTHROID) 50 MCG tablet Take 1 tablet (50 mcg total) by mouth once a week. Take by mouth; add a half a tablet twice a week. 90 tablet 0  . lisinopril (PRINIVIL,ZESTRIL) 5 MG tablet TAKE 1 TABLET BY MOUTH  ONCE A DAY    . lovastatin (MEVACOR) 20 MG tablet Take 20 mg by mouth at bedtime.    . montelukast (SINGULAIR) 10 MG tablet Take 10 mg by mouth at bedtime.    Marland Kitchen omeprazole (PRILOSEC) 40 MG capsule     . propranolol (INDERAL) 20 MG tablet Take 20 mg by mouth 2 (two) times daily.    Marland Kitchen PROVENTIL HFA 108 (90 BASE) MCG/ACT inhaler     . QUEtiapine (SEROQUEL) 100 MG tablet TAKE 1.5 TABLETS BY MOUTH EVERY NIGHT AT BEDTIME 45 tablet 1  . dicyclomine (BENTYL) 10 MG capsule Take by mouth. Reported on 01/26/2016     No current facility-administered medications for this visit.     Medical Decision Making:  Established Problem, Stable/Improving (1)  Treatment Plan Summary: Major depressive disorder- Continue Pristiq at 100 mg in the morning.   Continue Seroquel at 150 mg at bedtime.  If patient decides to switch to Cymbalta she will have to wean off the Pristiq first. She is available of this.  Generalized anxiety disorder- Same  As above.  She'll follow-up in 3 months.  She's been encouraged to call with questions concerns prior to her next appointment.  Mivaan Corbitt 08/13/2017, 1:17 PM

## 2017-08-29 ENCOUNTER — Telehealth: Payer: Self-pay | Admitting: Family Medicine

## 2017-08-29 NOTE — Telephone Encounter (Signed)
Patient request refill on thyroid medication. She has none left and calcitRIOL.  Pharmacy is the [facility] in [place]

## 2017-08-29 NOTE — Telephone Encounter (Signed)
She will take levothyroxine 50 g once daily

## 2017-08-29 NOTE — Telephone Encounter (Signed)
Please advise if okay to fill for 30 days for Calcitriol and Levothyroxine?  Also is her Levothyroxine 50 mcg instructions correct? I see that Almyra Free sent this prescription last but not sure if this is the correct dosage.  levothyroxine (SYNTHROID, LEVOTHROID) 50 MCG tablet [[id number]] Sig: Take 1 tablet (50 mcg total) by mouth once a week. Take by mouth; add a half a tablet twice a week.  Please advise.

## 2017-08-30 ENCOUNTER — Other Ambulatory Visit: Payer: Self-pay

## 2017-08-30 MED ORDER — CALCITRIOL 0.25 MCG PO CAPS: 0.2500 ug | ORAL_CAPSULE | Freq: Every day | ORAL | 0 refills | Status: DC

## 2017-08-30 MED ORDER — LEVOTHYROXINE SODIUM 50 MCG PO TABS: 50.0000 ug | ORAL_TABLET | Freq: Every day | ORAL | 0 refills | Status: DC

## 2017-08-30 NOTE — Telephone Encounter (Signed)
These medications have been sent to the pharmacy for 30 days. [facility]

## 2017-10-03 ENCOUNTER — Ambulatory Visit: Payer: BC Managed Care – PPO | Admitting: Endocrinology

## 2017-10-10 ENCOUNTER — Other Ambulatory Visit: Payer: Self-pay | Admitting: Endocrinology

## 2017-10-11 ENCOUNTER — Other Ambulatory Visit: Payer: Self-pay

## 2017-10-11 ENCOUNTER — Encounter: Payer: Self-pay | Admitting: Psychiatry

## 2017-10-11 ENCOUNTER — Ambulatory Visit: Payer: BC Managed Care – PPO | Admitting: Psychiatry

## 2017-10-11 VITALS — BP 128/79 | HR 86 | Temp 98.0°F | Wt 178.4 lb

## 2017-10-11 DIAGNOSIS — F3130 Bipolar disorder, current episode depressed, mild or moderate severity, unspecified: Secondary | ICD-10-CM

## 2017-10-11 DIAGNOSIS — F411 Generalized anxiety disorder: Secondary | ICD-10-CM

## 2017-10-11 MED ORDER — QUETIAPINE FUMARATE 100 MG PO TABS: ORAL_TABLET | ORAL | 1 refills | Status: DC

## 2017-10-11 NOTE — Progress Notes (Signed)
Patient ID: Kaylee Gordon, female   DOB: 06-02-1961, 57 y.o.   MRN: [id number]    [facility] MD/PA/NP OP Progress Note  10/11/2017 1:13 PM Kaylee Gordon  MRN:  [id number]  Subjective:  Patient returns for follow-up of anxiety and major depression. She reports doing ok overall. States that she feels she has been sleeping too much. States that she does not get bored since her retirement but feels like she will get to that point. She would slowly like to start looking for a part-time job. States that she has difficulty standing because of the fibromyalgia and would like a job where she can sit and answer phones. Reports her doctor was seen by a pediatrician and started on Zoloft and is doing well in terms of anxiety. Patient reports feeling much better about that. She went to the beach with her daughter and they had a good time. Denies any suicidal thoughts.   Chief Complaint: doing well  Chief Complaint    Follow-up     Visit Diagnosis:     ICD-10-CM   1. Bipolar I disorder, most recent episode depressed ([place]) F31.30   2. GAD (generalized anxiety disorder) F41.1     Past Medical History:  Past Medical History:  Diagnosis Date  . Anxiety   . Asthma   . Cancer [facility])    SKIN CANCER  . CHF (congestive heart failure) ([place])   . Depression   . Hypertension   . Thyroid disease     Past Surgical History:  Procedure Laterality Date  . ANKLE SURGERY Right 2012  . APPENDECTOMY    . AUGMENTATION MAMMAPLASTY Bilateral   . BREAST ENHANCEMENT SURGERY    . COLONOSCOPY WITH PROPOFOL N/A 02/22/2015   Procedure: COLONOSCOPY WITH PROPOFOL;  Surgeon: Josefine Class, MD;  Location: [facility] ENDOSCOPY;  Service: Endoscopy;  Laterality: N/A;  . ESOPHAGOGASTRODUODENOSCOPY (EGD) WITH PROPOFOL N/A 02/22/2015   Procedure: ESOPHAGOGASTRODUODENOSCOPY (EGD) WITH PROPOFOL;  Surgeon: Josefine Class, MD;  Location: [facility] ENDOSCOPY;  Service: Endoscopy;  Laterality: N/A;  . ESOPHAGOGASTRODUODENOSCOPY (EGD) WITH  PROPOFOL N/A 02/11/2016   Procedure: ESOPHAGOGASTRODUODENOSCOPY (EGD) WITH PROPOFOL;  Surgeon: Manya Silvas, MD;  Location: [facility] ENDOSCOPY;  Service: Endoscopy;  Laterality: N/A;  . HEMORRHOID SURGERY    . NASAL SINUS SURGERY    . SAVORY DILATION N/A 02/22/2015   Procedure: SAVORY DILATION;  Surgeon: Josefine Class, MD;  Location: [facility] ENDOSCOPY;  Service: Endoscopy;  Laterality: N/A;  . SAVORY DILATION N/A 02/11/2016   Procedure: SAVORY DILATION;  Surgeon: Manya Silvas, MD;  Location: [facility] ENDOSCOPY;  Service: Endoscopy;  Laterality: N/A;  . SKIN CANCER EXCISION     Family History:  Family History  Problem Relation Age of Onset  . Cancer Mother   . Depression Mother   . Anxiety disorder Mother   . Heart disease Father   . Cancer Father   . Anxiety disorder Father   . Depression Father   . Hypoparathyroidism Son    Social History:  Social History   Socioeconomic History  . Marital status: Married    Spouse name: Not on file  . Number of children: Not on file  . Years of education: Not on file  . Highest education level: Not on file  Occupational History  . Not on file  Social Needs  . Financial resource strain: Not on file  . Food insecurity:    Worry: Not on file    Inability: Not on file  . Transportation needs:  Medical: Not on file    Non-medical: Not on file  Tobacco Use  . Smoking status: Never Smoker  . Smokeless tobacco: Never Used  Substance and Sexual Activity  . Alcohol use: No    Alcohol/week: 0.0 oz    Comment: MAYBE ONCE OR TWICE A MONTH  . Drug use: No  . Sexual activity: Not Currently    Partners: Male  Lifestyle  . Physical activity:    Days per week: Not on file    Minutes per session: Not on file  . Stress: Not on file  Relationships  . Social connections:    Talks on phone: Not on file    Gets together: Not on file    Attends religious service: Not on file    Active member of club or organization: Not on file    Attends  meetings of clubs or organizations: Not on file    Relationship status: Not on file  Other Topics Concern  . Not on file  Social History Narrative  . Not on file   Additional History:   Musculoskeletal: Strength & Muscle Tone: within normal limits Gait & Station: normal Patient leans: N/A  Psychiatric Specialty Exam: Medication Refill   Anxiety  Patient reports no insomnia, nervous/anxious behavior or suicidal ideas.    Depression         Associated symptoms include does not have insomnia and no suicidal ideas.  Past medical history includes anxiety.   Insomnia  PMH includes: no depression.    Review of Systems  Psychiatric/Behavioral: Negative for depression, hallucinations, memory loss, substance abuse and suicidal ideas. The patient is not nervous/anxious and does not have insomnia.   All other systems reviewed and are negative.   Blood pressure 128/79, pulse 86, temperature 98 F (36.7 C), temperature source Oral, weight 80.9 kg (178 lb 6.4 oz).Body mass index is 30.62 kg/m.  General Appearance: Neat and Well Groomed  Eye Contact:  Good  Speech:  Clear and Coherent and Normal Rate  Volume:  Normal  Mood:  Doing better  Affect:  anxious  Thought Process:  Circumstantial  Orientation:  Full (Time, Place, and Person)  Thought Content:  Negative  Suicidal Thoughts:  No  Homicidal Thoughts:  No  Memory:  Immediate;   Good Recent;   Good Remote;   Good  Judgement:  Good  Insight:  Good  Psychomotor Activity:  Negative and Normal  Concentration:  Good  Recall:  Good  Fund of Knowledge: Good  Language: Good  Akathisia:  Negative  Handed:  Right  AIMS (if indicated):  Not done  Assets:  Communication Skills Desire for Improvement Vocational/Educational  ADL's:  Intact  Cognition: WNL  Sleep:  better   Is the patient at risk to self?  No. Has the patient been a risk to self in the past 6 months?  No. Has the patient been a risk to self within the distant  past?  No. Is the patient a risk to others?  No. Has the patient been a risk to others in the past 6 months?  No. Has the patient been a risk to others within the distant past?  No.  Current Medications: Current Outpatient Medications  Medication Sig Dispense Refill  . albuterol (PROVENTIL, VENTOLIN) (5 MG/ML) 0.5% NEBU Inhale into the lungs. Reported on 07/07/2015    . calcitRIOL (ROCALTROL) 0.25 MCG capsule TAKE ONE CAPSULE BY MOUTH DAILY 30 capsule 3  . celecoxib (CELEBREX) 200 MG capsule Take 200 mg  by mouth 2 (two) times daily.    Marland Kitchen desvenlafaxine (PRISTIQ) 50 MG 24 hr tablet Take 2 tablets (100 mg total) by mouth daily. 60 tablet 2  . dicyclomine (BENTYL) 10 MG capsule Take by mouth. Reported on 01/26/2016    . esomeprazole (NEXIUM) 40 MG capsule Take by mouth.    . fluticasone (FLONASE) 50 MCG/ACT nasal spray     . gabapentin (NEURONTIN) 100 MG capsule Take 1 capsule (100 mg total) by mouth 2 (two) times daily. 60 capsule 2  . levothyroxine (SYNTHROID, LEVOTHROID) 50 MCG tablet Take 1 tablet (50 mcg total) by mouth daily. 30 tablet 0  . lisinopril (PRINIVIL,ZESTRIL) 5 MG tablet TAKE 1 TABLET BY MOUTH ONCE A DAY    . lovastatin (MEVACOR) 20 MG tablet Take 20 mg by mouth at bedtime.    . montelukast (SINGULAIR) 10 MG tablet Take 10 mg by mouth at bedtime.    Marland Kitchen omeprazole (PRILOSEC) 40 MG capsule     . propranolol (INDERAL) 20 MG tablet Take 20 mg by mouth 2 (two) times daily.    Marland Kitchen PROVENTIL HFA 108 (90 BASE) MCG/ACT inhaler     . QUEtiapine (SEROQUEL) 100 MG tablet TAKE 1.5 TABLETS BY MOUTH EVERY NIGHT AT BEDTIME 45 tablet 1   No current facility-administered medications for this visit.     Medical Decision Making:  Established Problem, Stable/Improving (1)  Treatment Plan Summary: Major depressive disorder- Continue Pristiq at 100 mg in the morning.  Continue Seroquel at 150 mg at bedtime.  Patient happy with her current regimen and does not want any changes.  Generalized  anxiety disorder- Same  As above.  Discussed with patient that she will benefit from seeing her therapist weekly to address some issues. Also recommend starting mindfulness-based stress reduction program at [facility]. Recommend getting a massage once a month.  She'll follow-up in 2 months.  She's been encouraged to call with questions concerns prior to her next appointment.  Kaylee Gordon 10/11/2017, 1:13 PM

## 2017-10-19 ENCOUNTER — Ambulatory Visit: Payer: BC Managed Care – PPO | Admitting: Endocrinology

## 2017-10-19 DIAGNOSIS — Z0289 Encounter for other administrative examinations: Secondary | ICD-10-CM

## 2017-10-28 NOTE — Progress Notes (Signed)
Patient ID: Kaylee Gordon, female   DOB: 1961/05/30, 57 y.o.   MRN: [id number]   History of Present Illness:  Chief complaint: Followup of calcium and thyroid   Hypothyroidism:   She was tried on thyroxine supplements with a borderline TSH levels in 2011  However because of complaints of  fatigue on her visit in 10/15 she had thyroid levels done and free T4 was significantly low She was empirically started on 25 g  of levothyroxine Initially she felt less tired with this; however her free T4 continue to be low and her dose has been increased to 75 g since 1/16 when her free T4 was still low at 0.59 She had a low TSH in 11/16 and since then has been taking 50 mcg daily, this was changed by the PCP  She has been continuing to have fatigue which is variable however in some days she feels better and other day and exhausted She is also having issues with fibromyalgia, insomnia and depression Her weight has gradually gone up No cold intolerance  She is taking her levothyroxine daily in the morning but is also taking her calcium at the same time  Last free T4 is normal but she has not had any follow-up since 6/18    Wt Readings from Last 3 Encounters:  10/29/17 176 lb 9.6 oz (80.1 kg)  12/19/16 172 lb (78 kg)  01/26/16 153 lb (69.4 kg)    Lab Results  Component Value Date   FREET4 0.87 12/19/2016   FREET4 0.72 01/26/2016   FREET4 0.71 07/28/2015   TSH 2.03 12/19/2016   TSH 2.40 01/26/2016   TSH 2.64 07/22/2014    Hypoparathyroidism    Has had hypocalcemia since she was 57 years old and presented with symptoms of cramping in her hands along with twitching of her face and eyes. 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    No recurrence of symptoms of  tingling in her face, muscle cramping or twitching of muscles. May have periodic leg cramps as before, along with her muscle aches and stiffness  Calcium level has been  fairly consistent and was 9.2 done in 07/2017  She is taking calcitriol 0.25 mcg daily and also her calcium 1200 mg. Once a day   Lab Results  Component Value Date   CALCIUM 9.3 12/19/2016   CALCIUM 9.2 01/26/2016   CALCIUM 9.2 09/03/2015   CALCIUM 9.2 07/28/2015   CALCIUM 8.8 12/31/2014       Allergies as of 10/29/2017      Reactions   Atorvastatin    Parafon Forte Dsc [chlorzoxazone]    Pollen Extract    Sudafed [pseudoephedrine Hcl]       Medication List        Accurate as of 10/29/17 10:37 AM. Always use your most recent med list.          calcitRIOL 0.25 MCG capsule Commonly known as:  ROCALTROL TAKE ONE CAPSULE BY MOUTH DAILY   desvenlafaxine 50 MG 24 hr tablet Commonly known as:  PRISTIQ Take 2 tablets (100 mg total) by mouth daily.   esomeprazole 40 MG capsule Commonly known as:  NEXIUM Take by mouth.   fluticasone 50 MCG/ACT nasal spray Commonly known as:  FLONASE   gabapentin 100 MG capsule Commonly known as:  NEURONTIN Take 1 capsule (100 mg total) by mouth 2 (two) times daily.   levothyroxine 50 MCG tablet Commonly known as:  SYNTHROID, LEVOTHROID Take  1 tablet (50 mcg total) by mouth daily.   lisinopril 5 MG tablet Commonly known as:  PRINIVIL,ZESTRIL TAKE 1 TABLET BY MOUTH ONCE A DAY   lovastatin 20 MG tablet Commonly known as:  MEVACOR Take 20 mg by mouth at bedtime.   propranolol 20 MG tablet Commonly known as:  INDERAL Take 20 mg by mouth 2 (two) times daily.   albuterol (5 MG/ML) 0.5% Nebu Commonly known as:  PROVENTIL, VENTOLIN Inhale into the lungs. Reported on 07/07/2015   PROVENTIL HFA 108 (90 Base) MCG/ACT inhaler Generic drug:  albuterol   QUEtiapine 100 MG tablet Commonly known as:  SEROQUEL TAKE 1.5 TABLETS BY MOUTH EVERY NIGHT AT BEDTIME       Allergies:  Allergies  Allergen Reactions  . Atorvastatin   . Parafon Forte Dsc [Chlorzoxazone]   . Pollen Extract   . Sudafed [Pseudoephedrine Hcl]     Past  Medical History:  Diagnosis Date  . Anxiety   . Asthma   . Cancer [facility])    SKIN CANCER  . CHF (congestive heart failure) ([place])   . Depression   . Hypertension   . Thyroid disease     Past Surgical History:  Procedure Laterality Date  . ANKLE SURGERY Right 2012  . APPENDECTOMY    . AUGMENTATION MAMMAPLASTY Bilateral   . BREAST ENHANCEMENT SURGERY    . COLONOSCOPY WITH PROPOFOL N/A 02/22/2015   Procedure: COLONOSCOPY WITH PROPOFOL;  Surgeon: Josefine Class, MD;  Location: [facility] ENDOSCOPY;  Service: Endoscopy;  Laterality: N/A;  . ESOPHAGOGASTRODUODENOSCOPY (EGD) WITH PROPOFOL N/A 02/22/2015   Procedure: ESOPHAGOGASTRODUODENOSCOPY (EGD) WITH PROPOFOL;  Surgeon: Josefine Class, MD;  Location[facility] ENDOSCOPY;  Service: Endoscopy;  Laterality: N/A;  . ESOPHAGOGASTRODUODENOSCOPY (EGD) WITH PROPOFOL N/A 02/11/2016   Procedure: ESOPHAGOGASTRODUODENOSCOPY (EGD) WITH PROPOFOL;  Surgeon: Manya Silvas, MD;  Location: [facility] ENDOSCOPY;  Service: Endoscopy;  Laterality: N/A;  . HEMORRHOID SURGERY    . NASAL SINUS SURGERY    . SAVORY DILATION N/A 02/22/2015   Procedure: SAVORY DILATION;  Surgeon: Josefine Class, MD;  Location: [facility] ENDOSCOPY;  Service: Endoscopy;  Laterality: N/A;  . SAVORY DILATION N/A 02/11/2016   Procedure: SAVORY DILATION;  Surgeon: Manya Silvas, MD;  Location: [facility] ENDOSCOPY;  Service: Endoscopy;  Laterality: N/A;  . SKIN CANCER EXCISION      Family History  Problem Relation Age of Onset  . Cancer Mother   . Depression Mother   . Anxiety disorder Mother   . Heart disease Father   . Cancer Father   . Anxiety disorder Father   . Depression Father   . Hypoparathyroidism Son     Social History:  reports that she has never smoked. She has never used smokeless tobacco. She reports that she does not drink alcohol or use drugs.  Review of Systems    History of depression, is on Pristiq and Seroquel long-term  Taking fibromyalgia treatment with gabapentin  low doses   EXAM:  BP 130/90 (BP Location: Left Arm, Patient Position: Sitting, Cuff Size: Normal)   Pulse 88   Ht 5\' 4"  (1.626 m)   Wt 176 lb 9.6 oz (80.1 kg)   SpO2 96%   BMI 30.31 kg/m   Thyroid is not palpable  Biceps reflexes appear normal on the right  Assessment/Plan:   HYPOTHYROIDISM:  She has secondary hypothyroidism with  previously persistently low free T4 and normal TSH.   Currently on 50 mcg levothyroxine Her fatigue is related to other reasons  and difficult to judge her thyroid status clinically Need to check her labs today  Also discussed with her that she needs to take her levothyroxine before breakfast and not take her calcium until about lunchtime    HYPOPARATHYROIDISM, idiopathic:  Her calcium has been consistently well controlled current regimen of 0.25 mcg calcitriol and calcium 1200 mg a day She has been very consistent with her doses She has no recent symptoms She will have labs checked today Follow-up in 6 months   Kairav Russomanno 10/29/2017, 10:37 AM

## 2017-10-29 ENCOUNTER — Ambulatory Visit: Payer: BC Managed Care – PPO | Admitting: Endocrinology

## 2017-10-29 ENCOUNTER — Encounter: Payer: Self-pay | Admitting: Endocrinology

## 2017-10-29 VITALS — BP 130/90 | HR 88 | Ht 64.0 in | Wt 176.6 lb

## 2017-10-29 DIAGNOSIS — E200 Idiopathic hypoparathyroidism: Secondary | ICD-10-CM | POA: Diagnosis not present

## 2017-10-29 DIAGNOSIS — E038 Other specified hypothyroidism: Secondary | ICD-10-CM | POA: Diagnosis not present

## 2017-10-29 LAB — BASIC METABOLIC PANEL
BUN: 16 mg/dL (ref 6–23)
CALCIUM: 9.4 mg/dL (ref 8.4–10.5)
CO2: 24 meq/L (ref 19–32)
Chloride: 105 mEq/L (ref 96–112)
Creatinine, Ser: 0.86 mg/dL (ref 0.40–1.20)
GFR: 71.99 mL/min (ref >60.00)
GLUCOSE: 127 mg/dL — AB (ref 70–99)
Potassium: 3.5 mEq/L (ref 3.5–5.1)
SODIUM: 140 meq/L (ref 135–145)

## 2017-10-29 LAB — T4, FREE: Free T4: 0.59 ng/dL — ABNORMAL LOW (ref 0.60–1.60)

## 2017-10-29 LAB — TSH: TSH: 3.64 u[IU]/mL (ref 0.35–4.50)

## 2017-10-29 NOTE — Patient Instructions (Signed)
Take calcium at lunch

## 2017-11-05 ENCOUNTER — Telehealth: Payer: Self-pay | Admitting: Endocrinology

## 2017-11-05 NOTE — Telephone Encounter (Signed)
Patient is calling for lab result

## 2017-11-08 ENCOUNTER — Telehealth: Payer: Self-pay | Admitting: Endocrinology

## 2017-11-08 ENCOUNTER — Other Ambulatory Visit: Payer: Self-pay

## 2017-11-08 MED ORDER — LEVOTHYROXINE SODIUM 50 MCG PO TABS: 75.0000 ug | ORAL_TABLET | Freq: Every day | ORAL | 3 refills | Status: DC

## 2017-11-08 NOTE — Telephone Encounter (Signed)
Spoke with pt and RX sent in.

## 2017-11-08 NOTE — Telephone Encounter (Signed)
Patient needs RX for Thyroid Medication asap (patient out) sent to [facility] in [place]

## 2017-12-11 ENCOUNTER — Ambulatory Visit: Payer: Self-pay | Admitting: Psychiatry

## 2017-12-18 ENCOUNTER — Encounter: Payer: Self-pay | Admitting: Psychiatry

## 2017-12-18 ENCOUNTER — Ambulatory Visit: Payer: BC Managed Care – PPO | Admitting: Psychiatry

## 2017-12-18 ENCOUNTER — Other Ambulatory Visit: Payer: Self-pay

## 2017-12-18 VITALS — BP 133/85 | HR 92 | Temp 98.0°F | Wt 176.2 lb

## 2017-12-18 DIAGNOSIS — F411 Generalized anxiety disorder: Secondary | ICD-10-CM | POA: Diagnosis not present

## 2017-12-18 DIAGNOSIS — F3130 Bipolar disorder, current episode depressed, mild or moderate severity, unspecified: Secondary | ICD-10-CM

## 2017-12-18 MED ORDER — DESVENLAFAXINE SUCCINATE ER 50 MG PO TB24: 50.0000 mg | ORAL_TABLET | Freq: Every day | ORAL | 0 refills | Status: DC

## 2017-12-18 MED ORDER — DULOXETINE HCL 20 MG PO CPEP: 20.0000 mg | ORAL_CAPSULE | Freq: Every day | ORAL | 2 refills | Status: DC

## 2017-12-18 NOTE — Progress Notes (Signed)
Patient ID: Kaylee Gordon, female   DOB: 05-Feb-1961, 57 y.o.   MRN: [id number]    [facility] MD/PA/NP OP Progress Note  12/18/2017 2:26 PM Kaylee Gordon  MRN:  [id number]  Subjective:  Patient returns for follow-up of anxiety and major depression. She reports today that she has been quite depressed over the last few months. States that it has gotten worse over the last 2-3 weeks. She has not been sleeping well and having a lot of nightmares.States that it started around Christmas when she had a bad interaction with her father over the phone. States that he has always been mean and this time he spoke a lot of negative thin her children States that she has tried to call him and repair but he has not been nice to her. She reports feeling very bad about this and having difficulty functioning. States that she is unable to comprehend some information that was very simple and this is not like her.Denies any suicidal thoughts. Her primary care physician would also like to see her on Cymbalta to help with her fibromyalgia.   Chief Complaint: depressed and anxious  Chief Complaint    Follow-up; Medication Refill     Visit Diagnosis:     ICD-10-CM   1. Bipolar I disorder, most recent episode depressed ([place]) F31.30   2. GAD (generalized anxiety disorder) F41.1     Past Medical History:  Past Medical History:  Diagnosis Date  . Anxiety   . Asthma   . Cancer [facility])    SKIN CANCER  . CHF (congestive heart failure) ([place])   . Depression   . Hypertension   . Thyroid disease     Past Surgical History:  Procedure Laterality Date  . ANKLE SURGERY Right 2012  . APPENDECTOMY    . AUGMENTATION MAMMAPLASTY Bilateral   . BREAST ENHANCEMENT SURGERY    . COLONOSCOPY WITH PROPOFOL N/A 02/22/2015   Procedure: COLONOSCOPY WITH PROPOFOL;  Surgeon: Josefine Class, MD;  Location: [facility] ENDOSCOPY;  Service: Endoscopy;  Laterality: N/A;  . ESOPHAGOGASTRODUODENOSCOPY (EGD) WITH PROPOFOL N/A 02/22/2015   Procedure:  ESOPHAGOGASTRODUODENOSCOPY (EGD) WITH PROPOFOL;  Surgeon: Josefine Class, MD;  Location: [facility] ENDOSCOPY;  Service: Endoscopy;  Laterality: N/A;  . ESOPHAGOGASTRODUODENOSCOPY (EGD) WITH PROPOFOL N/A 02/11/2016   Procedure: ESOPHAGOGASTRODUODENOSCOPY (EGD) WITH PROPOFOL;  Surgeon: Manya Silvas, MD;  Location: [facility] ENDOSCOPY;  Service: Endoscopy;  Laterality: N/A;  . HEMORRHOID SURGERY    . NASAL SINUS SURGERY    . SAVORY DILATION N/A 02/22/2015   Procedure: SAVORY DILATION;  Surgeon: Josefine Class, MD;  Location: [facility] ENDOSCOPY;  Service: Endoscopy;  Laterality: N/A;  . SAVORY DILATION N/A 02/11/2016   Procedure: SAVORY DILATION;  Surgeon: Manya Silvas, MD;  Location: [facility] ENDOSCOPY;  Service: Endoscopy;  Laterality: N/A;  . SKIN CANCER EXCISION     Family History:  Family History  Problem Relation Age of Onset  . Cancer Mother   . Depression Mother   . Anxiety disorder Mother   . Heart disease Father   . Cancer Father   . Anxiety disorder Father   . Depression Father   . Hypoparathyroidism Son    Social History:  Social History   Socioeconomic History  . Marital status: Married    Spouse name: Not on file  . Number of children: Not on file  . Years of education: Not on file  . Highest education level: Not on file  Occupational History  . Not on file  Social Needs  . Financial resource strain: Not on file  . Food insecurity:    Worry: Not on file    Inability: Not on file  . Transportation needs:    Medical: Not on file    Non-medical: Not on file  Tobacco Use  . Smoking status: Never Smoker  . Smokeless tobacco: Never Used  Substance and Sexual Activity  . Alcohol use: No    Alcohol/week: 0.0 oz    Comment: MAYBE ONCE OR TWICE A MONTH  . Drug use: No  . Sexual activity: Not Currently    Partners: Male  Lifestyle  . Physical activity:    Days per week: Not on file    Minutes per session: Not on file  . Stress: Not on file  Relationships  . Social  connections:    Talks on phone: Not on file    Gets together: Not on file    Attends religious service: Not on file    Active member of club or organization: Not on file    Attends meetings of clubs or organizations: Not on file    Relationship status: Not on file  Other Topics Concern  . Not on file  Social History Narrative  . Not on file   Additional History:   Musculoskeletal: Strength & Muscle Tone: within normal limits Gait & Station: normal Patient leans: N/A  Psychiatric Specialty Exam: Medication Refill   Anxiety  Patient reports no insomnia, nervous/anxious behavior or suicidal ideas.    Depression         Associated symptoms include does not have insomnia and no suicidal ideas.  Past medical history includes anxiety.   Insomnia  PMH includes: no depression.    Review of Systems  Psychiatric/Behavioral: Negative for depression, hallucinations, memory loss, substance abuse and suicidal ideas. The patient is not nervous/anxious and does not have insomnia.   All other systems reviewed and are negative.   Blood pressure 133/85, pulse 92, temperature 98 F (36.7 C), temperature source Oral, weight 79.9 kg (176 lb 3.2 oz).Body mass index is 30.24 kg/m.  General Appearance: Neat and Well Groomed  Eye Contact:  Good  Speech:  Clear and Coherent and Normal Rate  Volume:  Normal  Mood:  depressed  Affect:  anxious  Thought Process:  Circumstantial  Orientation:  Full (Time, Place, and Person)  Thought Content:  Negative  Suicidal Thoughts:  No  Homicidal Thoughts:  No  Memory:  Immediate;   Good Recent;   Good Remote;   Good  Judgement:  Good  Insight:  Good  Psychomotor Activity:  Negative and Normal  Concentration:  Good  Recall:  Good  Fund of Knowledge: Good  Language: Good  Akathisia:  Negative  Handed:  Right  AIMS (if indicated):  Not done  Assets:  Communication Skills Desire for Improvement Vocational/Educational  ADL's:  Intact  Cognition:  WNL  Sleep:  better   Is the patient at risk to self?  No. Has the patient been a risk to self in the past 6 months?  No. Has the patient been a risk to self within the distant past?  No. Is the patient a risk to others?  No. Has the patient been a risk to others in the past 6 months?  No. Has the patient been a risk to others within the distant past?  No.  Current Medications: Current Outpatient Medications  Medication Sig Dispense Refill  . albuterol (PROVENTIL, VENTOLIN) (5 MG/ML) 0.5% NEBU Inhale  into the lungs. Reported on 07/07/2015    . calcitRIOL (ROCALTROL) 0.25 MCG capsule TAKE ONE CAPSULE BY MOUTH DAILY 30 capsule 3  . desvenlafaxine (PRISTIQ) 50 MG 24 hr tablet Take 1 tablet (50 mg total) by mouth daily. 30 tablet 0  . esomeprazole (NEXIUM) 40 MG capsule Take by mouth.    . fluticasone (FLONASE) 50 MCG/ACT nasal spray     . gabapentin (NEURONTIN) 100 MG capsule Take 1 capsule (100 mg total) by mouth 2 (two) times daily. (Patient taking differently: Take 100 mg by mouth 2 (two) times daily. ) 60 capsule 2  . levothyroxine (SYNTHROID, LEVOTHROID) 50 MCG tablet Take 1.5 tablets (75 mcg total) by mouth daily. 45 tablet 3  . lisinopril (PRINIVIL,ZESTRIL) 5 MG tablet TAKE 1 TABLET BY MOUTH ONCE A DAY    . lovastatin (MEVACOR) 20 MG tablet Take 20 mg by mouth at bedtime.    . propranolol (INDERAL) 20 MG tablet Take 20 mg by mouth 2 (two) times daily.     Marland Kitchen PROVENTIL HFA 108 (90 BASE) MCG/ACT inhaler     . QUEtiapine (SEROQUEL) 100 MG tablet TAKE 1.5 TABLETS BY MOUTH EVERY NIGHT AT BEDTIME 45 tablet 1  . DULoxetine (CYMBALTA) 20 MG capsule Take 1 capsule (20 mg total) by mouth daily. 30 capsule 2   No current facility-administered medications for this visit.     Medical Decision Making:  Established Problem, Stable/Improving (1)  Treatment Plan Summary: Major depressive disorder-  decrease Pristiq to 50 mg in the morning for 2 weeks and then stop. Start cymbalta at 20mg  daily.    Continue Seroquel at 150 mg at bedtime.    Generalized anxiety disorder- Same  As above.  Discussed with patient that she will benefit from seeing her therapist weekly to discuss issues and how to set boundaries with family Also recommend starting mindfulness-based stress reduction program at [facility] follow-up in 2 weeks.  She's been encouraged to call with questions concerns prior to her next appointment.  Carlie Solorzano 12/18/2017, 2:26 PM

## 2018-01-01 ENCOUNTER — Ambulatory Visit: Payer: BC Managed Care – PPO | Admitting: Psychiatry

## 2018-01-01 ENCOUNTER — Encounter: Payer: Self-pay | Admitting: Psychiatry

## 2018-01-01 VITALS — BP 106/74 | HR 72 | Ht 64.0 in | Wt 175.0 lb

## 2018-01-01 DIAGNOSIS — F411 Generalized anxiety disorder: Secondary | ICD-10-CM

## 2018-01-01 DIAGNOSIS — F3130 Bipolar disorder, current episode depressed, mild or moderate severity, unspecified: Secondary | ICD-10-CM | POA: Diagnosis not present

## 2018-01-01 MED ORDER — QUETIAPINE FUMARATE 100 MG PO TABS: ORAL_TABLET | ORAL | 1 refills | Status: DC

## 2018-01-01 MED ORDER — DULOXETINE HCL 20 MG PO CPEP: 40.0000 mg | ORAL_CAPSULE | Freq: Every day | ORAL | 1 refills | Status: DC

## 2018-01-01 NOTE — Progress Notes (Signed)
Patient ID: Kaylee Gordon, female   DOB: 12-10-1960, 57 y.o.   MRN: [id number]    [facility] MD/PA/NP OP Progress Note  01/01/2018 2:15 PM Kaylee Gordon  MRN:  [id number]  Subjective:  Patient returns for follow-up of anxiety and major depression. She reports today that she has been taking the cymbalta and is currently taking pristiq at 50mg . She reports dizziness over the past week. We discussed the discontinuation symptoms of pristiq. She is still sleeping too much. Mood slightly improved. She is not crying every day. She was able to get a manicure with her daughter and enjoyed it. She has not called her father and feels good about it. She missed him at father`s day. She looked into the mindfulness program at [facility] and is looking into it. She also reports her body pains as improved and looking forward to trips with her daughter.  PHQ 9 of 10  Chief Complaint: mood improving   Visit Diagnosis:     ICD-10-CM   1. Bipolar I disorder, most recent episode depressed ([place]) F31.30   2. GAD (generalized anxiety disorder) F41.1     Past Medical History:  Past Medical History:  Diagnosis Date  . Anxiety   . Asthma   . Cancer [facility])    SKIN CANCER  . CHF (congestive heart failure) ([place])   . Depression   . Hypertension   . Thyroid disease     Past Surgical History:  Procedure Laterality Date  . ANKLE SURGERY Right 2012  . APPENDECTOMY    . AUGMENTATION MAMMAPLASTY Bilateral   . BREAST ENHANCEMENT SURGERY    . COLONOSCOPY WITH PROPOFOL N/A 02/22/2015   Procedure: COLONOSCOPY WITH PROPOFOL;  Surgeon: Josefine Class, MD;  Location: [facility] ENDOSCOPY;  Service: Endoscopy;  Laterality: N/A;  . ESOPHAGOGASTRODUODENOSCOPY (EGD) WITH PROPOFOL N/A 02/22/2015   Procedure: ESOPHAGOGASTRODUODENOSCOPY (EGD) WITH PROPOFOL;  Surgeon: Josefine Class, MD;  Location: [facility] ENDOSCOPY;  Service: Endoscopy;  Laterality: N/A;  . ESOPHAGOGASTRODUODENOSCOPY (EGD) WITH PROPOFOL N/A 02/11/2016   Procedure:  ESOPHAGOGASTRODUODENOSCOPY (EGD) WITH PROPOFOL;  Surgeon: Manya Silvas, MD;  Location: [facility] ENDOSCOPY;  Service: Endoscopy;  Laterality: N/A;  . HEMORRHOID SURGERY    . NASAL SINUS SURGERY    . SAVORY DILATION N/A 02/22/2015   Procedure: SAVORY DILATION;  Surgeon: Josefine Class, MD;  Location: [facility] ENDOSCOPY;  Service: Endoscopy;  Laterality: N/A;  . SAVORY DILATION N/A 02/11/2016   Procedure: SAVORY DILATION;  Surgeon: Manya Silvas, MD;  Location: [facility] ENDOSCOPY;  Service: Endoscopy;  Laterality: N/A;  . SKIN CANCER EXCISION     Family History:  Family History  Problem Relation Age of Onset  . Cancer Mother   . Depression Mother   . Anxiety disorder Mother   . Heart disease Father   . Cancer Father   . Anxiety disorder Father   . Depression Father   . Hypoparathyroidism Son    Social History:  Social History   Socioeconomic History  . Marital status: Married    Spouse name: Not on file  . Number of children: Not on file  . Years of education: Not on file  . Highest education level: Not on file  Occupational History  . Not on file  Social Needs  . Financial resource strain: Not on file  . Food insecurity:    Worry: Not on file    Inability: Not on file  . Transportation needs:    Medical: Not on file    Non-medical: Not  on file  Tobacco Use  . Smoking status: Never Smoker  . Smokeless tobacco: Never Used  Substance and Sexual Activity  . Alcohol use: No    Alcohol/week: 0.0 oz    Comment: MAYBE ONCE OR TWICE A MONTH  . Drug use: No  . Sexual activity: Not Currently    Partners: Male  Lifestyle  . Physical activity:    Days per week: Not on file    Minutes per session: Not on file  . Stress: Not on file  Relationships  . Social connections:    Talks on phone: Not on file    Gets together: Not on file    Attends religious service: Not on file    Active member of club or organization: Not on file    Attends meetings of clubs or organizations: Not  on file    Relationship status: Not on file  Other Topics Concern  . Not on file  Social History Narrative  . Not on file   Additional History:   Musculoskeletal: Strength & Muscle Tone: within normal limits Gait & Station: normal Patient leans: N/A  Psychiatric Specialty Exam: Medication Refill   Anxiety  Patient reports no insomnia, nervous/anxious behavior or suicidal ideas.    Depression         Associated symptoms include does not have insomnia and no suicidal ideas.  Past medical history includes anxiety.   Insomnia  PMH includes: no depression.    Review of Systems  Psychiatric/Behavioral: Negative for depression, hallucinations, memory loss, substance abuse and suicidal ideas. The patient is not nervous/anxious and does not have insomnia.   All other systems reviewed and are negative.   Blood pressure 106/74, pulse 72, height 5\' 4"  (1.626 m), weight 79.4 kg (175 lb), SpO2 94 %.Body mass index is 30.04 kg/m.  General Appearance: Neat and Well Groomed  Eye Contact:  Good  Speech:  Clear and Coherent and Normal Rate  Volume:  Normal  Mood:  improving  Affect:  Not as anxious  Thought Process:  Circumstantial  Orientation:  Full (Time, Place, and Person)  Thought Content:  Negative  Suicidal Thoughts:  No  Homicidal Thoughts:  No  Memory:  Immediate;   Good Recent;   Good Remote;   Good  Judgement:  Good  Insight:  Good  Psychomotor Activity:  Negative and Normal  Concentration:  Good  Recall:  Good  Fund of Knowledge: Good  Language: Good  Akathisia:  Negative  Handed:  Right  AIMS (if indicated):  Not done  Assets:  Communication Skills Desire for Improvement Vocational/Educational  ADL's:  Intact  Cognition: WNL  Sleep:  better   Is the patient at risk to self?  No. Has the patient been a risk to self in the past 6 months?  No. Has the patient been a risk to self within the distant past?  No. Is the patient a risk to others?  No. Has the  patient been a risk to others in the past 6 months?  No. Has the patient been a risk to others within the distant past?  No.  Current Medications: Current Outpatient Medications  Medication Sig Dispense Refill  . albuterol (PROVENTIL, VENTOLIN) (5 MG/ML) 0.5% NEBU Inhale into the lungs. Reported on 07/07/2015    . calcitRIOL (ROCALTROL) 0.25 MCG capsule TAKE ONE CAPSULE BY MOUTH DAILY 30 capsule 3  . desvenlafaxine (PRISTIQ) 50 MG 24 hr tablet Take 1 tablet (50 mg total) by mouth daily. 30 tablet  0  . DULoxetine (CYMBALTA) 20 MG capsule Take 1 capsule (20 mg total) by mouth daily. 30 capsule 2  . esomeprazole (NEXIUM) 40 MG capsule Take by mouth.    . fluticasone (FLONASE) 50 MCG/ACT nasal spray     . levothyroxine (SYNTHROID, LEVOTHROID) 50 MCG tablet Take 1.5 tablets (75 mcg total) by mouth daily. 45 tablet 3  . lisinopril (PRINIVIL,ZESTRIL) 5 MG tablet TAKE 1 TABLET BY MOUTH ONCE A DAY    . lovastatin (MEVACOR) 20 MG tablet Take 20 mg by mouth at bedtime.    . propranolol (INDERAL) 20 MG tablet Take 20 mg by mouth 2 (two) times daily.     Marland Kitchen PROVENTIL HFA 108 (90 BASE) MCG/ACT inhaler     . QUEtiapine (SEROQUEL) 100 MG tablet TAKE 1.5 TABLETS BY MOUTH EVERY NIGHT AT BEDTIME 45 tablet 1  . gabapentin (NEURONTIN) 100 MG capsule Take 1 capsule (100 mg total) by mouth 2 (two) times daily. (Patient taking differently: Take 100 mg by mouth 2 (two) times daily. ) 60 capsule 2   No current facility-administered medications for this visit.     Medical Decision Making:  Established Problem, Stable/Improving (1)  Treatment Plan Summary: Major depressive disorder-  Discontinue Pristiq. Increase cymbalta to 40mg  daily.   Decrease Seroquel to 100 mg at bedtime.    Generalized anxiety disorder- Same  As above.  Continue seeing her therapist weekly to discuss issues and how to set boundaries with family  Encourage starting mindfulness-based stress reduction program at [facility] follow-up  in 4 weeks.  She's been encouraged to call with questions concerns prior to her next appointment.  Fronia Depass 01/01/2018, 2:15 PM

## 2018-01-31 ENCOUNTER — Ambulatory Visit: Payer: BC Managed Care – PPO | Admitting: Psychiatry

## 2018-02-05 ENCOUNTER — Other Ambulatory Visit: Payer: Self-pay | Admitting: Endocrinology

## 2018-02-05 ENCOUNTER — Encounter: Payer: Self-pay | Admitting: Psychiatry

## 2018-02-05 ENCOUNTER — Ambulatory Visit: Payer: BC Managed Care – PPO | Admitting: Psychiatry

## 2018-02-05 ENCOUNTER — Other Ambulatory Visit: Payer: Self-pay

## 2018-02-05 VITALS — BP 137/86 | HR 109 | Temp 99.1°F | Wt 177.6 lb

## 2018-02-05 DIAGNOSIS — F3130 Bipolar disorder, current episode depressed, mild or moderate severity, unspecified: Secondary | ICD-10-CM

## 2018-02-05 MED ORDER — QUETIAPINE FUMARATE 100 MG PO TABS: ORAL_TABLET | ORAL | 2 refills | Status: DC

## 2018-02-05 MED ORDER — DULOXETINE HCL 20 MG PO CPEP: 40.0000 mg | ORAL_CAPSULE | Freq: Every day | ORAL | 2 refills | Status: DC

## 2018-02-05 NOTE — Progress Notes (Signed)
Patient ID: Kaylee Gordon, female   DOB: 04-07-1961, 57 y.o.   MRN: [id number]    [facility] MD/PA/NP OP Progress Note  02/05/2018 2:44 PM Kaylee Gordon  MRN:  [id number]  Subjective:  Patient returns for follow-up of anxiety and major depression. She reports today that she has been taking the cymbalta at 40mg  and has discontinued pristiq.  Her body pains have improved. She has had stomach issues for a few months and is seeing a GI doctor. Sh eis getting an endoscopy and colonoscopy in August.  Looking forward to a beach trip with the family in august. She looked into the mindfulness program at [facility] and is looking into it.  Chief Complaint: mood improving  Chief Complaint    Follow-up; Medication Refill     Visit Diagnosis:     ICD-10-CM   1. Bipolar I disorder, most recent episode depressed ([place]) F31.30     Past Medical History:  Past Medical History:  Diagnosis Date  . Anxiety   . Asthma   . Cancer [facility])    SKIN CANCER  . CHF (congestive heart failure) ([place])   . Depression   . Hypertension   . Thyroid disease     Past Surgical History:  Procedure Laterality Date  . ANKLE SURGERY Right 2012  . APPENDECTOMY    . AUGMENTATION MAMMAPLASTY Bilateral   . BREAST ENHANCEMENT SURGERY    . COLONOSCOPY WITH PROPOFOL N/A 02/22/2015   Procedure: COLONOSCOPY WITH PROPOFOL;  Surgeon: Josefine Class, MD;  Location: [facility] ENDOSCOPY;  Service: Endoscopy;  Laterality: N/A;  . ESOPHAGOGASTRODUODENOSCOPY (EGD) WITH PROPOFOL N/A 02/22/2015   Procedure: ESOPHAGOGASTRODUODENOSCOPY (EGD) WITH PROPOFOL;  Surgeon: Josefine Class, MD;  Location: [facility] ENDOSCOPY;  Service: Endoscopy;  Laterality: N/A;  . ESOPHAGOGASTRODUODENOSCOPY (EGD) WITH PROPOFOL N/A 02/11/2016   Procedure: ESOPHAGOGASTRODUODENOSCOPY (EGD) WITH PROPOFOL;  Surgeon: Manya Silvas, MD;  Location: [facility] ENDOSCOPY;  Service: Endoscopy;  Laterality: N/A;  . HEMORRHOID SURGERY    . NASAL SINUS SURGERY    . SAVORY DILATION N/A  02/22/2015   Procedure: SAVORY DILATION;  Surgeon: Josefine Class, MD;  Location: [facility] ENDOSCOPY;  Service: Endoscopy;  Laterality: N/A;  . SAVORY DILATION N/A 02/11/2016   Procedure: SAVORY DILATION;  Surgeon: Manya Silvas, MD;  Location: [facility] ENDOSCOPY;  Service: Endoscopy;  Laterality: N/A;  . SKIN CANCER EXCISION     Family History:  Family History  Problem Relation Age of Onset  . Cancer Mother   . Depression Mother   . Anxiety disorder Mother   . Heart disease Father   . Cancer Father   . Anxiety disorder Father   . Depression Father   . Hypoparathyroidism Son    Social History:  Social History   Socioeconomic History  . Marital status: Married    Spouse name: Not on file  . Number of children: Not on file  . Years of education: Not on file  . Highest education level: Not on file  Occupational History  . Not on file  Social Needs  . Financial resource strain: Not on file  . Food insecurity:    Worry: Not on file    Inability: Not on file  . Transportation needs:    Medical: Not on file    Non-medical: Not on file  Tobacco Use  . Smoking status: Never Smoker  . Smokeless tobacco: Never Used  Substance and Sexual Activity  . Alcohol use: No    Alcohol/week: 0.0 oz  Comment: MAYBE ONCE OR TWICE A MONTH  . Drug use: No  . Sexual activity: Not Currently    Partners: Male  Lifestyle  . Physical activity:    Days per week: Not on file    Minutes per session: Not on file  . Stress: Not on file  Relationships  . Social connections:    Talks on phone: Not on file    Gets together: Not on file    Attends religious service: Not on file    Active member of club or organization: Not on file    Attends meetings of clubs or organizations: Not on file    Relationship status: Not on file  Other Topics Concern  . Not on file  Social History Narrative  . Not on file   Additional History:   Musculoskeletal: Strength & Muscle Tone: within normal  limits Gait & Station: normal Patient leans: N/A  Psychiatric Specialty Exam: Medication Refill   Anxiety  Patient reports no insomnia, nervous/anxious behavior or suicidal ideas.    Depression         Associated symptoms include does not have insomnia and no suicidal ideas.  Past medical history includes anxiety.   Insomnia  PMH includes: no depression.    Review of Systems  Psychiatric/Behavioral: Negative for depression, hallucinations, memory loss, substance abuse and suicidal ideas. The patient is not nervous/anxious and does not have insomnia.   All other systems reviewed and are negative.   Blood pressure 137/86, pulse (!) 109, temperature 99.1 F (37.3 C), temperature source Oral, weight 80.6 kg (177 lb 9.6 oz).Body mass index is 30.48 kg/m.  General Appearance: Neat and Well Groomed  Eye Contact:  Good  Speech:  Clear and Coherent and Normal Rate  Volume:  Normal  Mood:  improving  Affect:  Not as anxious  Thought Process:  Circumstantial  Orientation:  Full (Time, Place, and Person)  Thought Content:  Negative  Suicidal Thoughts:  No  Homicidal Thoughts:  No  Memory:  Immediate;   Good Recent;   Good Remote;   Good  Judgement:  Good  Insight:  Good  Psychomotor Activity:  Negative and Normal  Concentration:  Good  Recall:  Good  Fund of Knowledge: Good  Language: Good  Akathisia:  Negative  Handed:  Right  AIMS (if indicated):  Not done  Assets:  Communication Skills Desire for Improvement Vocational/Educational  ADL's:  Intact  Cognition: WNL  Sleep:  better   Is the patient at risk to self?  No. Has the patient been a risk to self in the past 6 months?  No. Has the patient been a risk to self within the distant past?  No. Is the patient a risk to others?  No. Has the patient been a risk to others in the past 6 months?  No. Has the patient been a risk to others within the distant past?  No.  Current Medications: Current Outpatient Medications   Medication Sig Dispense Refill  . albuterol (PROVENTIL, VENTOLIN) (5 MG/ML) 0.5% NEBU Inhale into the lungs. Reported on 07/07/2015    . calcitRIOL (ROCALTROL) 0.25 MCG capsule TAKE ONE CAPSULE BY MOUTH DAILY 30 capsule 2  . desvenlafaxine (PRISTIQ) 50 MG 24 hr tablet Take 1 tablet (50 mg total) by mouth daily. 30 tablet 0  . DULoxetine (CYMBALTA) 20 MG capsule Take 2 capsules (40 mg total) by mouth daily. 60 capsule 1  . esomeprazole (NEXIUM) 40 MG capsule Take by mouth.    Marland Kitchen  fluticasone (FLONASE) 50 MCG/ACT nasal spray     . levothyroxine (SYNTHROID, LEVOTHROID) 50 MCG tablet Take 1.5 tablets (75 mcg total) by mouth daily. 45 tablet 3  . lisinopril (PRINIVIL,ZESTRIL) 5 MG tablet TAKE 1 TABLET BY MOUTH ONCE A DAY    . lovastatin (MEVACOR) 20 MG tablet Take 20 mg by mouth at bedtime.    . propranolol (INDERAL) 20 MG tablet Take 20 mg by mouth 2 (two) times daily.     Marland Kitchen PROVENTIL HFA 108 (90 BASE) MCG/ACT inhaler     . QUEtiapine (SEROQUEL) 100 MG tablet TAKE 1 TABLETS BY MOUTH EVERY NIGHT AT BEDTIME 30 tablet 1  . gabapentin (NEURONTIN) 100 MG capsule Take 1 capsule (100 mg total) by mouth 2 (two) times daily. (Patient taking differently: Take 100 mg by mouth 2 (two) times daily. ) 60 capsule 2   No current facility-administered medications for this visit.     Medical Decision Making:  Established Problem, Stable/Improving (1)  Treatment Plan Summary: Major depressive disorder-  Discontinue Pristiq. Continue cymbalta to 40mg  daily.   Continue Seroquel to 100 mg at bedtime.    Generalized anxiety disorder- Same  As above.  Continue seeing her therapist weekly to discuss issues and how to set boundaries with family  Encourage starting mindfulness-based stress reduction program at [facility] follow-up in 2 months.  She's been encouraged to call with questions concerns prior to her next appointment.  Loann Chahal 02/05/2018, 2:44 PM

## 2018-02-13 ENCOUNTER — Ambulatory Visit: Payer: BC Managed Care – PPO | Admitting: Endocrinology

## 2018-03-06 ENCOUNTER — Other Ambulatory Visit: Payer: Self-pay | Admitting: Endocrinology

## 2018-03-06 ENCOUNTER — Ambulatory Visit
Admission: RE | Admit: 2018-03-06 | Payer: BC Managed Care – PPO | Source: Ambulatory Visit | Admitting: Internal Medicine

## 2018-03-06 ENCOUNTER — Encounter: Admission: RE | Payer: Self-pay | Source: Ambulatory Visit

## 2018-03-06 SURGERY — EGD (ESOPHAGOGASTRODUODENOSCOPY)
Anesthesia: General

## 2018-03-28 ENCOUNTER — Other Ambulatory Visit: Payer: Self-pay | Admitting: Gastroenterology

## 2018-03-28 DIAGNOSIS — R1031 Right lower quadrant pain: Secondary | ICD-10-CM

## 2018-03-28 DIAGNOSIS — R1032 Left lower quadrant pain: Principal | ICD-10-CM

## 2018-03-29 ENCOUNTER — Other Ambulatory Visit: Payer: Self-pay | Admitting: Gastroenterology

## 2018-03-29 DIAGNOSIS — R1031 Right lower quadrant pain: Secondary | ICD-10-CM

## 2018-03-29 DIAGNOSIS — R1032 Left lower quadrant pain: Principal | ICD-10-CM

## 2018-04-01 ENCOUNTER — Encounter: Payer: Self-pay | Admitting: Endocrinology

## 2018-04-03 ENCOUNTER — Encounter: Payer: Self-pay | Admitting: Endocrinology

## 2018-04-03 ENCOUNTER — Ambulatory Visit: Payer: BC Managed Care – PPO | Admitting: Endocrinology

## 2018-04-03 VITALS — BP 126/78 | HR 70 | Resp 20 | Ht 64.0 in | Wt 177.2 lb

## 2018-04-03 DIAGNOSIS — E200 Idiopathic hypoparathyroidism: Secondary | ICD-10-CM

## 2018-04-03 DIAGNOSIS — E038 Other specified hypothyroidism: Secondary | ICD-10-CM

## 2018-04-03 LAB — BASIC METABOLIC PANEL
BUN: 16 mg/dL (ref 6–23)
CO2: 29 meq/L (ref 19–32)
Calcium: 9.3 mg/dL (ref 8.4–10.5)
Chloride: 104 mEq/L (ref 96–112)
Creatinine, Ser: 0.94 mg/dL (ref 0.40–1.20)
GFR: 64.87 mL/min (ref >60.00)
GLUCOSE: 114 mg/dL — AB (ref 70–99)
POTASSIUM: 3.6 meq/L (ref 3.5–5.1)
SODIUM: 140 meq/L (ref 135–145)

## 2018-04-03 LAB — TSH: TSH: 1.68 u[IU]/mL (ref 0.35–4.50)

## 2018-04-03 LAB — T4, FREE: FREE T4: 0.91 ng/dL (ref 0.60–1.60)

## 2018-04-03 NOTE — Progress Notes (Signed)
Patient ID: Kaylee Gordon, female   DOB: May 27, 1961, 57 y.o.   MRN: [id number]   History of Present Illness:  Chief complaint: Followup of calcium and thyroid   Hypothyroidism:   She was initially given thyroxine supplement with a borderline TSH levels in 2011 However because of complaints of  fatigue on her visit in 10/15 she had thyroid levels done and free T4 was significantly low She was empirically started on 25 g  of levothyroxine Initially she felt less tired with this; however her free T4 continue to be low and her dose has been increased to 75 g since 1/16 when her free T4 was still low at 0.59 She had a low TSH in 11/16 and since then has been taking 50 mcg daily, this was changed by the PCP  She has been continuing to have fatigue on each visit She thinks she is also having problems with fibromyalgia which causes her fatigue No cold intolerance On her last visit in 4/19 she was found to have a low free T4 and was told to take 75 mcg of levothyroxine However even though her message was delivered on her voicemail for this change she does not remember this and has continued to take only 50 mcg  She is taking her levothyroxine daily in the morning consistently, has been told not to take her calcium at the same time  Last free T4 is low, level pending from today   Wt Readings from Last 3 Encounters:  04/03/18 177 lb 4 oz (80.4 kg)  10/29/17 176 lb 9.6 oz (80.1 kg)  12/19/16 172 lb (78 kg)    Lab Results  Component Value Date   FREET4 0.59 (L) 10/29/2017   FREET4 0.87 12/19/2016   FREET4 0.72 01/26/2016   TSH 3.64 10/29/2017   TSH 2.03 12/19/2016   TSH 2.40 01/26/2016    Hypoparathyroidism    Has had hypocalcemia since she was 57 years old and presented with symptoms of cramping in her hands along with twitching of her face and eyes. She was diagnosed at [facility] as having primary idiopathic hypoparathyroidism.    No recurrence of symptoms of   tingling in her face, muscle cramping in her hands or twitching of muscles including eyes. May have periodic leg cramps which is not new  Calcium level has been fairly consistent in the normal range  She is taking calcitriol 0.25 mcg daily and also her calcium 1200 mg. Once a day She has been compliant with this   Lab Results  Component Value Date   CALCIUM 9.4 10/29/2017   CALCIUM 9.3 12/19/2016   CALCIUM 9.2 01/26/2016   CALCIUM 9.2 09/03/2015   CALCIUM 9.2 07/28/2015       Allergies as of 04/03/2018      Reactions   Atorvastatin    Parafon Forte Dsc [chlorzoxazone]    Pollen Extract    Sudafed [pseudoephedrine Hcl]       Medication List        Accurate as of 04/03/18  2:01 PM. Always use your most recent med list.          calcitRIOL 0.25 MCG capsule Commonly known as:  ROCALTROL TAKE ONE CAPSULE BY MOUTH DAILY   DULoxetine 20 MG capsule Commonly known as:  CYMBALTA Take 2 capsules (40 mg total) by mouth daily.   fluticasone 50 MCG/ACT nasal spray Commonly known as:  FLONASE   gabapentin 100 MG capsule Commonly known as:  NEURONTIN Take 1  capsule (100 mg total) by mouth 2 (two) times daily.   levothyroxine 50 MCG tablet Commonly known as:  SYNTHROID, LEVOTHROID TAKE 1.5 TABLETS BY MOUTH DAILY   lisinopril 5 MG tablet Commonly known as:  PRINIVIL,ZESTRIL TAKE 1 TABLET BY MOUTH ONCE A DAY   lovastatin 20 MG tablet Commonly known as:  MEVACOR Take 20 mg by mouth at bedtime.   pantoprazole 40 MG tablet Commonly known as:  PROTONIX   propranolol 20 MG tablet Commonly known as:  INDERAL Take 20 mg by mouth 2 (two) times daily.   albuterol (5 MG/ML) 0.5% Nebu Commonly known as:  PROVENTIL, VENTOLIN Inhale into the lungs. Reported on 07/07/2015   PROVENTIL HFA 108 (90 Base) MCG/ACT inhaler Generic drug:  albuterol   QUEtiapine 100 MG tablet Commonly known as:  SEROQUEL TAKE 1 TABLETS BY MOUTH EVERY NIGHT AT BEDTIME       Allergies:    Allergies  Allergen Reactions  . Atorvastatin   . Parafon Forte Dsc [Chlorzoxazone]   . Pollen Extract   . Sudafed [Pseudoephedrine Hcl]     Past Medical History:  Diagnosis Date  . Anxiety   . Asthma   . Cancer [facility])    SKIN CANCER  . CHF (congestive heart failure) ([place])   . Depression   . Hypertension   . Thyroid disease     Past Surgical History:  Procedure Laterality Date  . ANKLE SURGERY Right 2012  . APPENDECTOMY    . AUGMENTATION MAMMAPLASTY Bilateral   . BREAST ENHANCEMENT SURGERY    . COLONOSCOPY WITH PROPOFOL N/A 02/22/2015   Procedure: COLONOSCOPY WITH PROPOFOL;  Surgeon: Josefine Class, MD;  Location: [facility] ENDOSCOPY;  Service: Endoscopy;  Laterality: N/A;  . ESOPHAGOGASTRODUODENOSCOPY (EGD) WITH PROPOFOL N/A 02/22/2015   Procedure: ESOPHAGOGASTRODUODENOSCOPY (EGD) WITH PROPOFOL;  Surgeon: Josefine Class, MD;  Location: [facility] ENDOSCOPY;  Service: Endoscopy;  Laterality: N/A;  . ESOPHAGOGASTRODUODENOSCOPY (EGD) WITH PROPOFOL N/A 02/11/2016   Procedure: ESOPHAGOGASTRODUODENOSCOPY (EGD) WITH PROPOFOL;  Surgeon: Manya Silvas, MD;  Location: [facility] ENDOSCOPY;  Service: Endoscopy;  Laterality: N/A;  . HEMORRHOID SURGERY    . NASAL SINUS SURGERY    . SAVORY DILATION N/A 02/22/2015   Procedure: SAVORY DILATION;  Surgeon: Josefine Class, MD;  Location: [facility] ENDOSCOPY;  Service: Endoscopy;  Laterality: N/A;  . SAVORY DILATION N/A 02/11/2016   Procedure: SAVORY DILATION;  Surgeon: Manya Silvas, MD;  Location: [facility] ENDOSCOPY;  Service: Endoscopy;  Laterality: N/A;  . SKIN CANCER EXCISION      Family History  Problem Relation Age of Onset  . Cancer Mother   . Depression Mother   . Anxiety disorder Mother   . Heart disease Father   . Cancer Father   . Anxiety disorder Father   . Depression Father   . Hypoparathyroidism Son     Social History:  reports that she has never smoked. She has never used smokeless tobacco. She reports that she does not drink  alcohol or use drugs.  Review of Systems    History of depression, is on duloxetine and Seroquel long-term     EXAM:  BP 126/78 (BP Location: Left Arm, Patient Position: Sitting, Cuff Size: Normal)   Pulse 70   Resp 20   Ht 5\' 4"  (1.626 m)   Wt 177 lb 4 oz (80.4 kg)   SpO2 98%   BMI 30.42 kg/m   Thyroid is not palpable  Biceps reflexes normal on the right [place]ostek sign is negative  Assessment/Plan:   HYPOTHYROIDISM:  She has secondary hypothyroidism with  previously  low free T4 and normal TSH.   Currently on 50 mcg levothyroxine Even though she was told to go up to 75 mcg she did not change her dose and not clear if she is still symptomatic from this; free T4 was low in 4/19 She tends to have fatigue chronically   Need to check her labs today    HYPOPARATHYROIDISM, idiopathic:  Her calcium has been consistently well controlled using current regimen of 0.25 mcg calcitriol and calcium 1200 mg a day She is very compliant with this regimen Symptoms are well controlled  She will have labs checked today Follow-up in 6 months if no changes made   Elayne Snare 04/03/2018, 2:01 PM

## 2018-04-04 ENCOUNTER — Ambulatory Visit: Payer: BC Managed Care – PPO

## 2018-04-04 NOTE — Progress Notes (Signed)
Please call to let patient know that the lab results are normal and no further action needed

## 2018-04-11 ENCOUNTER — Ambulatory Visit: Payer: BC Managed Care – PPO | Admitting: Psychiatry

## 2018-04-12 ENCOUNTER — Other Ambulatory Visit: Payer: Self-pay | Admitting: Gastroenterology

## 2018-04-12 DIAGNOSIS — R1032 Left lower quadrant pain: Principal | ICD-10-CM

## 2018-04-12 DIAGNOSIS — R1031 Right lower quadrant pain: Secondary | ICD-10-CM

## 2018-04-16 ENCOUNTER — Other Ambulatory Visit: Payer: Self-pay

## 2018-04-16 ENCOUNTER — Encounter: Payer: Self-pay | Admitting: Psychiatry

## 2018-04-16 ENCOUNTER — Ambulatory Visit: Payer: BC Managed Care – PPO | Admitting: Psychiatry

## 2018-04-16 VITALS — BP 140/82 | Temp 98.1°F | Wt 178.8 lb

## 2018-04-16 DIAGNOSIS — F3130 Bipolar disorder, current episode depressed, mild or moderate severity, unspecified: Secondary | ICD-10-CM

## 2018-04-16 DIAGNOSIS — F411 Generalized anxiety disorder: Secondary | ICD-10-CM

## 2018-04-16 DIAGNOSIS — F331 Major depressive disorder, recurrent, moderate: Secondary | ICD-10-CM | POA: Diagnosis not present

## 2018-04-16 MED ORDER — QUETIAPINE FUMARATE 100 MG PO TABS: ORAL_TABLET | ORAL | 2 refills | Status: DC

## 2018-04-16 MED ORDER — DULOXETINE HCL 20 MG PO CPEP: 60.0000 mg | ORAL_CAPSULE | Freq: Every day | ORAL | 1 refills | Status: DC

## 2018-04-16 NOTE — Progress Notes (Signed)
Patient ID: Kaylee Gordon, female   DOB: 1960/11/08, 57 y.o.   MRN: [id number]    [facility] MD/PA/NP OP Progress Note  04/16/2018 4:27 PM Kaylee Gordon  MRN:  [id number]  Subjective:  Patient returns for follow-up of anxiety and major depression. She reports today that she has not doing well. Her son was not doing well, they had a bad trip to the beach. Her son was not taking his meds. He is better now. She reports being stressed about her daughter as well. She has had stomach issues and feels bad overall.  Chief Complaint: mood has been up and down  Chief Complaint    Follow-up; Medication Refill     Visit Diagnosis:     ICD-10-CM   1. Bipolar I disorder, most recent episode depressed ([place]) F31.30   2. GAD (generalized anxiety disorder) F41.1   3. Major depressive disorder, recurrent episode, moderate ([facility]) F33.1     Past Medical History:  Past Medical History:  Diagnosis Date  . Anxiety   . Asthma   . Cancer [facility])    SKIN CANCER  . CHF (congestive heart failure) ([place])   . Depression   . Hypertension   . Thyroid disease     Past Surgical History:  Procedure Laterality Date  . ANKLE SURGERY Right 2012  . APPENDECTOMY    . AUGMENTATION MAMMAPLASTY Bilateral   . BREAST ENHANCEMENT SURGERY    . COLONOSCOPY WITH PROPOFOL N/A 02/22/2015   Procedure: COLONOSCOPY WITH PROPOFOL;  Surgeon: Josefine Class, MD;  Location: [facility] ENDOSCOPY;  Service: Endoscopy;  Laterality: N/A;  . ESOPHAGOGASTRODUODENOSCOPY (EGD) WITH PROPOFOL N/A 02/22/2015   Procedure: ESOPHAGOGASTRODUODENOSCOPY (EGD) WITH PROPOFOL;  Surgeon: Josefine Class, MD;  Location: [facility] ENDOSCOPY;  Service: Endoscopy;  Laterality: N/A;  . ESOPHAGOGASTRODUODENOSCOPY (EGD) WITH PROPOFOL N/A 02/11/2016   Procedure: ESOPHAGOGASTRODUODENOSCOPY (EGD) WITH PROPOFOL;  Surgeon: Manya Silvas, MD;  Location: [facility] ENDOSCOPY;  Service: Endoscopy;  Laterality: N/A;  . HEMORRHOID SURGERY    . NASAL SINUS SURGERY    . SAVORY  DILATION N/A 02/22/2015   Procedure: SAVORY DILATION;  Surgeon: Josefine Class, MD;  Location: [facility] ENDOSCOPY;  Service: Endoscopy;  Laterality: N/A;  . SAVORY DILATION N/A 02/11/2016   Procedure: SAVORY DILATION;  Surgeon: Manya Silvas, MD;  Location: [facility] ENDOSCOPY;  Service: Endoscopy;  Laterality: N/A;  . SKIN CANCER EXCISION     Family History:  Family History  Problem Relation Age of Onset  . Cancer Mother   . Depression Mother   . Anxiety disorder Mother   . Heart disease Father   . Cancer Father   . Anxiety disorder Father   . Depression Father   . Hypoparathyroidism Son    Social History:  Social History   Socioeconomic History  . Marital status: Married    Spouse name: Not on file  . Number of children: Not on file  . Years of education: Not on file  . Highest education level: Not on file  Occupational History  . Not on file  Social Needs  . Financial resource strain: Not on file  . Food insecurity:    Worry: Not on file    Inability: Not on file  . Transportation needs:    Medical: Not on file    Non-medical: Not on file  Tobacco Use  . Smoking status: Never Smoker  . Smokeless tobacco: Never Used  Substance and Sexual Activity  . Alcohol use: No    Alcohol/week: 0.0  standard drinks    Comment: MAYBE ONCE OR TWICE A MONTH  . Drug use: No  . Sexual activity: Not Currently    Partners: Male  Lifestyle  . Physical activity:    Days per week: Not on file    Minutes per session: Not on file  . Stress: Not on file  Relationships  . Social connections:    Talks on phone: Not on file    Gets together: Not on file    Attends religious service: Not on file    Active member of club or organization: Not on file    Attends meetings of clubs or organizations: Not on file    Relationship status: Not on file  Other Topics Concern  . Not on file  Social History Narrative  . Not on file   Additional History:   Musculoskeletal: Strength & Muscle  Tone: within normal limits Gait & Station: normal Patient leans: N/A  Psychiatric Specialty Exam: Medication Refill   Anxiety  Patient reports no insomnia, nervous/anxious behavior or suicidal ideas.    Depression         Associated symptoms include does not have insomnia and no suicidal ideas.  Past medical history includes anxiety.   Insomnia  PMH includes: no depression.    Review of Systems  Psychiatric/Behavioral: Negative for depression, hallucinations, memory loss, substance abuse and suicidal ideas. The patient is not nervous/anxious and does not have insomnia.   All other systems reviewed and are negative.   Blood pressure 140/82, temperature 98.1 F (36.7 C), temperature source Oral, weight 178 lb 12.8 oz (81.1 kg).Body mass index is 30.69 kg/m.  General Appearance: Neat and Well Groomed  Eye Contact:  Good  Speech:  Clear and Coherent and Normal Rate  Volume:  Normal  Mood:  down  Affect:   anxious  Thought Process:  Circumstantial  Orientation:  Full (Time, Place, and Person)  Thought Content:  Negative  Suicidal Thoughts:  No  Homicidal Thoughts:  No  Memory:  Immediate;   Good Recent;   Good Remote;   Good  Judgement:  Good  Insight:  Good  Psychomotor Activity:  Negative and Normal  Concentration:  Good  Recall:  Good  Fund of Knowledge: Good  Language: Good  Akathisia:  Negative  Handed:  Right  AIMS (if indicated):  Not done  Assets:  Communication Skills Desire for Improvement Vocational/Educational  ADL's:  Intact  Cognition: WNL  Sleep:  better   Is the patient at risk to self?  No. Has the patient been a risk to self in the past 6 months?  No. Has the patient been a risk to self within the distant past?  No. Is the patient a risk to others?  No. Has the patient been a risk to others in the past 6 months?  No. Has the patient been a risk to others within the distant past?  No.  Current Medications: Current Outpatient Medications   Medication Sig Dispense Refill  . albuterol (PROVENTIL, VENTOLIN) (5 MG/ML) 0.5% NEBU Inhale into the lungs. Reported on 07/07/2015    . calcitRIOL (ROCALTROL) 0.25 MCG capsule TAKE ONE CAPSULE BY MOUTH DAILY 30 capsule 2  . DULoxetine (CYMBALTA) 20 MG capsule Take 3 capsules (60 mg total) by mouth daily. 90 capsule 1  . fluticasone (FLONASE) 50 MCG/ACT nasal spray     . levothyroxine (SYNTHROID, LEVOTHROID) 50 MCG tablet TAKE 1.5 TABLETS BY MOUTH DAILY 45 tablet 2  . lisinopril (PRINIVIL,ZESTRIL) 5  MG tablet TAKE 1 TABLET BY MOUTH ONCE A DAY    . lovastatin (MEVACOR) 20 MG tablet Take 20 mg by mouth at bedtime.    . pantoprazole (PROTONIX) 40 MG tablet     . propranolol (INDERAL) 20 MG tablet Take 20 mg by mouth 2 (two) times daily.     Marland Kitchen PROVENTIL HFA 108 (90 BASE) MCG/ACT inhaler     . QUEtiapine (SEROQUEL) 100 MG tablet TAKE 1 TABLETS BY MOUTH EVERY NIGHT AT BEDTIME 30 tablet 2  . gabapentin (NEURONTIN) 100 MG capsule Take 1 capsule (100 mg total) by mouth 2 (two) times daily. (Patient taking differently: Take 100 mg by mouth 2 (two) times daily. ) 60 capsule 2   No current facility-administered medications for this visit.     Medical Decision Making:  Established Problem, Stable/Improving (1)  Treatment Plan Summary: Major depressive disorder-  Discontinue Pristiq. Increase cymbalta to 60mg  daily.   Continue Seroquel to 100 mg at bedtime.    Generalized anxiety disorder- Same  As above.  Continue seeing her therapist weekly to discuss issues and how to set boundaries with family  Encourage starting mindfulness-based stress reduction program at [facility] follow-up in 2 weeks. She's been encouraged to call with questions concerns prior to her next appointment.  Dencil Cayson 04/16/2018, 4:27 PM

## 2018-04-17 ENCOUNTER — Ambulatory Visit
Admission: RE | Admit: 2018-04-17 | Discharge: 2018-04-17 | Disposition: A | Discharge status: Home / Self Care | Payer: BC Managed Care – PPO | Source: Ambulatory Visit | Attending: Gastroenterology | Admitting: Gastroenterology

## 2018-04-17 DIAGNOSIS — R1031 Right lower quadrant pain: Secondary | ICD-10-CM

## 2018-04-17 DIAGNOSIS — R1032 Left lower quadrant pain: Principal | ICD-10-CM

## 2018-04-17 MED ORDER — IOPAMIDOL (ISOVUE-300) INJECTION 61%
100.0000 mL | Freq: Once | INTRAVENOUS | Status: AC | PRN
  Administered 2018-04-17: 100 mL via INTRAVENOUS

## 2018-04-19 ENCOUNTER — Ambulatory Visit: Payer: BC Managed Care – PPO | Admitting: Pulmonary Disease

## 2018-04-19 ENCOUNTER — Encounter: Payer: Self-pay | Admitting: Pulmonary Disease

## 2018-04-19 VITALS — BP 130/80 | HR 93 | Resp 16 | Ht 64.0 in | Wt 178.0 lb

## 2018-04-19 DIAGNOSIS — K449 Diaphragmatic hernia without obstruction or gangrene: Secondary | ICD-10-CM

## 2018-04-19 DIAGNOSIS — K219 Gastro-esophageal reflux disease without esophagitis: Secondary | ICD-10-CM

## 2018-04-19 DIAGNOSIS — R918 Other nonspecific abnormal finding of lung field: Secondary | ICD-10-CM

## 2018-04-19 DIAGNOSIS — Z85820 Personal history of malignant melanoma of skin: Secondary | ICD-10-CM

## 2018-04-19 NOTE — Patient Instructions (Signed)
1) A dedicated CT scan of the chest will be performed.  2) We will see you in follow-up in 2 to 3 weeks time

## 2018-04-19 NOTE — Progress Notes (Signed)
Subjective:    Patient ID: Kaylee Gordon, female    DOB: 29-Jul-1960, 57 y.o.   MRN: [id number]  HPI the patient is a 57 year old lifelong never smoker who presents here for evaluation of an abnormal CT scan. The patient is referred by the [facility] G[facility] clinic. Patient has been having some issues with abdominal pain and dysphagia. She has had extensive evaluation to include an EGD with esophageal dilation of a stricture in the earlier part of September as well as colonoscopy. Since then the patient has noted improvement with regards to her dysphagia. She has also noted improvements with regards to her gastroesophageal reflux symptoms which persist,but to a lesser degree, on the medication. The patient continues to have issues with abdominal pain. Because of this CT scan of the abdomen and pelvis was performed. I have reviewed the films independently. She is being referred because on the visible lung windows the patient had a cluster of nodules on the right base the largest measuring 11 mm in size. There is also a 9 mm density in the left lower lobe. However, note that this is an abdominal and pelvic CT and so the remainder of the chest is not visible. The concern with these nodules is that the patient has a history of melanoma, she had a melanoma excised from her left arm in 1983. She states that she follows with a dermatologist yearly in this regard. There has been up to now, no evidence of recurrence.  The patient does not note any cough or sputum production. She notices shortness of breath associated with her abdominal pain this also gives her some mild dyspnea on exertion. She does not have these symptoms unless she has the abdominal pain. She also occasionally notices chills associated with the abdominal pain. She does note that she also has recurrent sinus infections for which she goes to urgent care regularly.  She has had no anorexia. No weight loss. Orthopnea or paroxysmal nocturnal dyspnea.  States overall the symptom duration of her abdominal pain is around six months.  We have reviewed her social history, family history, past medical and surgical history. The patient as noted, is a lifelong never smoker. 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 Her parents include cats and dogs in the home no other pets and no exotic pets. She has no unusual hobbies. She has not had any recent travel outside the country exposure to TB and has not lived outside of [place].  Review of Systems  Constitutional: Positive for chills and fatigue. Negative for activity change, appetite change, diaphoresis, fever and unexpected weight change.  HENT: Positive for sinus pressure and trouble swallowing (Improved after esophageal dilation).   Eyes: Negative.   Respiratory: Positive for shortness of breath (Only when she has abdominal pain.). Negative for apnea, cough, choking, chest tightness, wheezing and stridor.   Cardiovascular: Negative for chest pain, palpitations and leg swelling.  Gastrointestinal: Positive for abdominal pain and nausea. Negative for blood in stool, constipation, diarrhea, rectal pain and vomiting.       She does have significant reflux symptoms particularly at nighttime.  Endocrine: Negative.   Genitourinary: Negative.   Musculoskeletal: Negative.   Allergic/Immunologic: Negative.   Neurological: Negative.   Hematological: Negative.   Psychiatric/Behavioral: Positive for sleep disturbance (Does have snoring and non-restorative sleep. No documented apneas.). The patient is nervous/anxious.  Objective:   Physical Exam  Constitutional: She is oriented to person, place, and time. She appears well-developed and well-nourished.  Non-toxic appearance. She does not appear ill. No distress.  Anxious demeanor  HENT:  Head: Normocephalic and atraumatic.  Eyes: Pupils  are equal, round, and reactive to light. EOM are normal.  Neck: Normal range of motion. Neck supple. No JVD present. No tracheal deviation present. No thyromegaly present.  Cardiovascular: Normal rate, regular rhythm and normal pulses.  No extrasystoles are present. Exam reveals no gallop and no friction rub.  No murmur heard. Pulmonary/Chest: Effort normal and breath sounds normal. No accessory muscle usage or stridor. No tachypnea. No respiratory distress. She has no decreased breath sounds. She has no wheezes. She has no rhonchi. She has no rales.  Abdominal: Soft. Bowel sounds are normal. She exhibits no distension. There is no tenderness.  Musculoskeletal: Normal range of motion.       Right lower leg: She exhibits no tenderness and no edema.       Left lower leg: She exhibits no tenderness and no edema.  Lymphadenopathy:    She has no cervical adenopathy.  Neurological: She is alert and oriented to person, place, and time. No cranial nerve deficit.  Skin: Skin is warm and dry. No abrasion, no ecchymosis and no rash noted. She is not diaphoretic. No cyanosis or erythema. Nails show no clubbing.  Psychiatric: Her behavior is normal. Her mood appears anxious. She is not agitated.  Nursing note and vitals reviewed.   I have reviewed the patient's available imaging independently. Findings are as noted above.      Assessment & Plan:   1) Multiple lung nodules bilateral: the nodules in question were noted on the CT a abdomen and pelvis CT. Patient needs a dedicated CT of the chest to see these nodules in context. The areas appear to be associated with inflammation and this could be a possibility as the patient has hiatal hernia and reflux and chronic silent aspiration could be a cause. However, the patient does have a history of malignant melanoma excised in 1983. The patient also was noted to have some mesenteric adenopathy that was enlarged. Metastatic disease has not been ruled out. I  discussed all the above with the patient my feeling is that the findings in the lung may be secondary to inflammatory changes however I cannot explain the abdominal findings. She will need to discuss these findings with her gastroenterologist. For now will obtain the dedicated CT scan of the chest and will see the patient follow-up in 2 to 3 weeks time.   2) Hiatal hernia with gastroesophageal reflux: this issue adds complexity to her management. Some of endings noted on the CT of the chest could be related to chronic aspiration from gastroesophageal reflux. In addition this could be also adding to the patient's symptoms of dyspnea.  3) History of malignant melanoma of the skin: this issue adds complexity to her management as it puts her pulmonary findings on CT on a different light. Burden is now to prove that the findings noted are not related to malignancy.  I have reviewed the patient's films with the patient and her husband who was present. I have answered all their questions to the best of my ability. Plan of care has been discussed. He seemed to be satisfied with the plan of care. We'll see her in follow-up in 2 to 3 weeks time she is to contact us prior to the time should any  new difficulties arise.  Thank you for allowing me to participate in this patient's care.

## 2018-04-25 ENCOUNTER — Other Ambulatory Visit: Payer: BC Managed Care – PPO

## 2018-04-26 ENCOUNTER — Ambulatory Visit
Admission: RE | Admit: 2018-04-26 | Discharge: 2018-04-26 | Disposition: A | Discharge status: Home / Self Care | Payer: BC Managed Care – PPO | Source: Ambulatory Visit | Attending: Pulmonary Disease | Admitting: Pulmonary Disease

## 2018-04-26 DIAGNOSIS — R918 Other nonspecific abnormal finding of lung field: Secondary | ICD-10-CM

## 2018-04-26 MED ORDER — IOPAMIDOL (ISOVUE-300) INJECTION 61%
75.0000 mL | Freq: Once | INTRAVENOUS | Status: AC | PRN
  Administered 2018-04-26: 75 mL via INTRAVENOUS

## 2018-04-30 ENCOUNTER — Other Ambulatory Visit: Payer: Self-pay

## 2018-04-30 ENCOUNTER — Encounter: Payer: Self-pay | Admitting: Psychiatry

## 2018-04-30 ENCOUNTER — Ambulatory Visit: Payer: BC Managed Care – PPO | Admitting: Endocrinology

## 2018-04-30 ENCOUNTER — Ambulatory Visit: Payer: BC Managed Care – PPO | Admitting: Psychiatry

## 2018-04-30 VITALS — BP 138/84 | HR 97 | Wt 180.8 lb

## 2018-04-30 DIAGNOSIS — F3130 Bipolar disorder, current episode depressed, mild or moderate severity, unspecified: Secondary | ICD-10-CM

## 2018-04-30 DIAGNOSIS — F411 Generalized anxiety disorder: Secondary | ICD-10-CM | POA: Diagnosis not present

## 2018-04-30 NOTE — Progress Notes (Signed)
Patient ID: Kaylee Gordon, female   DOB: January 26, 1961, 57 y.o.   MRN: [id number]    [facility] MD/PA/NP OP Progress Note  04/30/2018 1:19 PM [name]  MRN:  [id number]  Subjective:  Patient returns for follow-up of anxiety and major depression. She reports today that she feels tired and achy. She was able to tolerate the increase in Cymbalta well, but cannot say if it made any difference.She went on 2 back to back trips to the [place] , states it made her feel tired. States she had some nodules on her lungs and is getting furthur workup. She is a bit worried, says she has cancer on both sides of her family.   Denies any suicidal thoughts.  Chief Complaint: mood has been down  Chief Complaint    Follow-up; Medication Refill     Visit Diagnosis:     ICD-10-CM   1. Bipolar I disorder, most recent episode depressed ([place]) F31.30   2. GAD (generalized anxiety disorder) F41.1     Past Medical History:  Past Medical History:  Diagnosis Date  . Anxiety   . Asthma   . Cancer [facility])    SKIN CANCER  . CHF (congestive heart failure) ([place])   . Depression   . Hypertension   . Thyroid disease     Past Surgical History:  Procedure Laterality Date  . ANKLE SURGERY Right 2012  . APPENDECTOMY    . AUGMENTATION MAMMAPLASTY Bilateral   . BREAST ENHANCEMENT SURGERY    . COLONOSCOPY WITH PROPOFOL N/A 02/22/2015   Procedure: COLONOSCOPY WITH PROPOFOL;  Surgeon: Josefine Class, MD;  Location: [facility] ENDOSCOPY;  Service: Endoscopy;  Laterality: N/A;  . ESOPHAGOGASTRODUODENOSCOPY (EGD) WITH PROPOFOL N/A 02/22/2015   Procedure: ESOPHAGOGASTRODUODENOSCOPY (EGD) WITH PROPOFOL;  Surgeon: Josefine Class, MD;  Location: [facility] ENDOSCOPY;  Service: Endoscopy;  Laterality: N/A;  . ESOPHAGOGASTRODUODENOSCOPY (EGD) WITH PROPOFOL N/A 02/11/2016   Procedure: ESOPHAGOGASTRODUODENOSCOPY (EGD) WITH PROPOFOL;  Surgeon: Manya Silvas, MD;  Location: [facility] ENDOSCOPY;  Service: Endoscopy;   Laterality: N/A;  . HEMORRHOID SURGERY    . NASAL SINUS SURGERY    . SAVORY DILATION N/A 02/22/2015   Procedure: SAVORY DILATION;  Surgeon: Josefine Class, MD;  Location: [facility] ENDOSCOPY;  Service: Endoscopy;  Laterality: N/A;  . SAVORY DILATION N/A 02/11/2016   Procedure: SAVORY DILATION;  Surgeon: Manya Silvas, MD;  Location: [facility] ENDOSCOPY;  Service: Endoscopy;  Laterality: N/A;  . SKIN CANCER EXCISION     Family History:  Family History  Problem Relation Age of Onset  . Cancer Mother   . Depression Mother   . Anxiety disorder Mother   . Heart disease Father   . Cancer Father   . Anxiety disorder Father   . Depression Father   . Hypoparathyroidism Son    Social History:  Social History   Socioeconomic History  . Marital status: Married    Spouse name: Not on file  . Number of children: Not on file  . Years of education: Not on file  . Highest education level: Not on file  Occupational History  . Not on file  Social Needs  . Financial resource strain: Not on file  . Food insecurity:    Worry: Not on file    Inability: Not on file  . Transportation needs:    Medical: Not on file    Non-medical: Not on file  Tobacco Use  . Smoking status: Never Smoker  . Smokeless tobacco: Never  Used  Substance and Sexual Activity  . Alcohol use: No    Alcohol/week: 0.0 standard drinks    Comment: MAYBE ONCE OR TWICE A MONTH  . Drug use: No  . Sexual activity: Not Currently    Partners: Male  Lifestyle  . Physical activity:    Days per week: Not on file    Minutes per session: Not on file  . Stress: Not on file  Relationships  . Social connections:    Talks on phone: Not on file    Gets together: Not on file    Attends religious service: Not on file    Active member of club or organization: Not on file    Attends meetings of clubs or organizations: Not on file    Relationship status: Not on file  Other Topics Concern  . Not on file  Social History Narrative  .  Not on file   Additional History:   Musculoskeletal: Strength & Muscle Tone: within normal limits Gait & Station: normal Patient leans: N/A  Psychiatric Specialty Exam: Medication Refill   Anxiety  Patient reports no insomnia, nervous/anxious behavior or suicidal ideas.    Depression         Associated symptoms include does not have insomnia and no suicidal ideas.  Past medical history includes anxiety.   Insomnia  PMH includes: no depression.    Review of Systems  Psychiatric/Behavioral: Negative for depression, hallucinations, memory loss, substance abuse and suicidal ideas. The patient is not nervous/anxious and does not have insomnia.   All other systems reviewed and are negative.   Blood pressure 138/84, pulse 97, weight 180 lb 12.8 oz (82 kg).Body mass index is 31.03 kg/m.  General Appearance: Neat and Well Groomed  Eye Contact:  Good  Speech:  Clear and Coherent and Normal Rate  Volume:  Normal  Mood:  worried  Affect:   anxious  Thought Process:  Circumstantial  Orientation:  Full (Time, Place, and Person)  Thought Content:  Negative  Suicidal Thoughts:  No  Homicidal Thoughts:  No  Memory:  Immediate;   Good Recent;   Good Remote;   Good  Judgement:  Good  Insight:  Good  Psychomotor Activity:  Negative and Normal  Concentration:  Good  Recall:  Good  Fund of Knowledge: Good  Language: Good  Akathisia:  Negative  Handed:  Right  AIMS (if indicated):  Not done  Assets:  Communication Skills Desire for Improvement Vocational/Educational  ADL's:  Intact  Cognition: WNL  Sleep:  ok   Is the patient at risk to self?  No. Has the patient been a risk to self in the past 6 months?  No. Has the patient been a risk to self within the distant past?  No. Is the patient a risk to others?  No. Has the patient been a risk to others in the past 6 months?  No. Has the patient been a risk to others within the distant past?  No.  Current Medications: Current  Outpatient Medications  Medication Sig Dispense Refill  . albuterol (PROVENTIL, VENTOLIN) (5 MG/ML) 0.5% NEBU Inhale into the lungs. Reported on 07/07/2015    . calcitRIOL (ROCALTROL) 0.25 MCG capsule TAKE ONE CAPSULE BY MOUTH DAILY 30 capsule 2  . DULoxetine (CYMBALTA) 20 MG capsule Take 3 capsules (60 mg total) by mouth daily. 90 capsule 1  . fluticasone (FLONASE) 50 MCG/ACT nasal spray     . levothyroxine (SYNTHROID, LEVOTHROID) 50 MCG tablet TAKE 1.5 TABLETS BY  MOUTH DAILY 45 tablet 2  . lisinopril (PRINIVIL,ZESTRIL) 5 MG tablet TAKE 1 TABLET BY MOUTH ONCE A DAY    . lovastatin (MEVACOR) 20 MG tablet Take 20 mg by mouth at bedtime.    . pantoprazole (PROTONIX) 40 MG tablet     . propranolol (INDERAL) 20 MG tablet Take 20 mg by mouth 2 (two) times daily.     . QUEtiapine (SEROQUEL) 100 MG tablet TAKE 1 TABLETS BY MOUTH EVERY NIGHT AT BEDTIME 30 tablet 2  . gabapentin (NEURONTIN) 100 MG capsule Take 1 capsule (100 mg total) by mouth 2 (two) times daily. (Patient taking differently: Take 100 mg by mouth 2 (two) times daily. ) 60 capsule 2   No current facility-administered medications for this visit.     Medical Decision Making:  Established Problem, Stable/Improving (1)  Treatment Plan Summary:  Major depressive disorder- Continue cymbalta at 60mg  daily.   Continue Seroquel at 100 mg at bedtime.    Generalized anxiety disorder- Same  As above.  Continue seeing her therapist weekly to discuss issues and how to set boundaries with family  Encourage starting mindfulness-based stress reduction program at [facility] follow-up in 4 weeks. She's been encouraged to call with questions concerns prior to her next appointment.  Cora Brierley 04/30/2018, 1:19 PM

## 2018-05-01 ENCOUNTER — Ambulatory Visit: Payer: BC Managed Care – PPO | Admitting: Pulmonary Disease

## 2018-05-01 ENCOUNTER — Encounter: Payer: Self-pay | Admitting: Pulmonary Disease

## 2018-05-01 VITALS — BP 134/88 | HR 107 | Resp 16 | Ht 65.0 in | Wt 179.0 lb

## 2018-05-01 DIAGNOSIS — R911 Solitary pulmonary nodule: Secondary | ICD-10-CM | POA: Diagnosis not present

## 2018-05-01 DIAGNOSIS — R918 Other nonspecific abnormal finding of lung field: Secondary | ICD-10-CM | POA: Diagnosis not present

## 2018-05-01 DIAGNOSIS — Z85820 Personal history of malignant melanoma of skin: Secondary | ICD-10-CM | POA: Diagnosis not present

## 2018-05-01 DIAGNOSIS — R0600 Dyspnea, unspecified: Secondary | ICD-10-CM

## 2018-05-01 NOTE — Patient Instructions (Signed)
1) A pet CT has been ordered to further evaluate spots in your lung.  2) We have ordered breathing tests and an echocardiogram to evaluate your shortness of breath.  3) We will see you in follow-up in 3 to 4 weeks.

## 2018-05-01 NOTE — Progress Notes (Signed)
Subjective:    Patient ID: Kaylee Gordon, female    DOB: [birth year], 57 y.o.   MRN: [id number]  HPI this is a 57 year old lifelong never smoker who presents here for follow-up of an abnormal CT scan. Her initial visit here was on 4 October. Recall that she had multiple nodules noted on a CT scan of the abdomen and pelvis. We did not have a dedicated CT scan of the chest at that time. She also has a history notable for excision of melanoma in 1983. She underwent CT scan of the chest on 11 October. This showed persistent scatter nodules more on the right and on the left the mowers dominant nodules are in the lower lobe of the right posteriorly. Previously the largest of these nodules was 11 mm and on the latest CT scan of the chest this is noted to be 12 mm and somewhat speculated. Given her history of melanoma this is worrisome. I reviewed the films independently and showed the films to the patient.  Previously the patient only complained of dyspnea when she had abdominal pain which is being evaluated by G.I. He has issues with gastroesophageal reflux in a hiatal hernia. She also previously had some issues with dysphagia but had undergone esophageal dilation and this is not been an issue since. She however continues to be symptomatic with regards to her hiatal hernia. She is unsure if her dyspnea is related to anxiety. She sometimes also notes some chest tightness. She has issues with fibromyalgia and she is unsure whether the comfort in her chest is actually related to the fibromyalgia.  She has not had any fevers, chills or sweats and voices no other complaint since her last visit.   Review of Systems  Constitutional: Positive for fatigue.  HENT: Negative for congestion, sinus pain and trouble swallowing.   Eyes: Negative.   Respiratory: Positive for chest tightness and shortness of breath. Negative for cough, choking and wheezing.   Cardiovascular: Positive for leg swelling.    Gastrointestinal: Positive for abdominal pain.  All other systems reviewed and are negative.      Objective:   Physical Exam  Constitutional: She is oriented to person, place, and time. She appears well-developed  she is overweight.  Non-toxic appearance. She does not appear ill. No distress.  Anxious demeanor  HENT:  Head: Normocephalic and atraumatic.  Eyes: Pupils are equal, round, and reactive. No scleral icterus Neck: supple. No JVD present. No tracheal deviation present. No thyromegaly present.  Cardiovascular: Normal rate, regular rhythm and normal pulses.  No murmur heard. Pulmonary/Chest: Effort normal and breath sounds normal. No accessory muscle usage or stridor. No tachypnea. No respiratory distress. She has no decreased breath sounds. She has no wheezes. She has no rhonchi. She has no rales.  Abdominal: Soft. Bowel sounds are normal. She exhibits no distension. There is no tenderness.  Musculoskeletal: Normal range of motion. No edema.  Lymphadenopathy:    She has no cervical adenopathy.  Neurological: She is alert and oriented to person, place, and time. No cranial nerve deficit.  Skin: Skin is warm and dry, no rash noted. She is not diaphoretic. No cyanosis or clubbing.  Psychiatric: Her behavior is normal. Her mood appears anxious. She is not agitated.  Nursing note and vitals reviewed.      Assessment & Plan:   1) Dyspnea: she has developed some issues with dyspnea that are ill characterized. She had a CT scan of the chest with contrast on 11 October  that did not show any PE. Will proceed with obtaining pulmonary function testing and 2D Echo to evaluate this issue.  2) Lung nodules: the patient has multiple lung nodules the largest of which appears to have grown slightly from her last CT scan of the chest. She has a history of prior malignant melanoma. This is worrisome for potential metastases. Will obtain PET/CT to further evaluate this issue.  3) History of  malignant melanoma: this issue adds complexity to her management and raises suspicions of potential metastatic disease. Workup as above.  We will see her in follow-up in 3 to 4 weeks time she is to contact us prior to that time should any new difficulties arise.

## 2018-05-06 ENCOUNTER — Ambulatory Visit
Admission: RE | Admit: 2018-05-06 | Discharge: 2018-05-06 | Disposition: A | Discharge status: Home / Self Care | Payer: BC Managed Care – PPO | Source: Ambulatory Visit | Attending: Pulmonary Disease | Admitting: Pulmonary Disease

## 2018-05-06 ENCOUNTER — Other Ambulatory Visit: Payer: Self-pay | Admitting: Endocrinology

## 2018-05-06 DIAGNOSIS — I110 Hypertensive heart disease with heart failure: Secondary | ICD-10-CM | POA: Diagnosis not present

## 2018-05-06 DIAGNOSIS — I509 Heart failure, unspecified: Secondary | ICD-10-CM | POA: Insufficient documentation

## 2018-05-06 DIAGNOSIS — I700 Atherosclerosis of aorta: Secondary | ICD-10-CM | POA: Insufficient documentation

## 2018-05-06 DIAGNOSIS — R918 Other nonspecific abnormal finding of lung field: Secondary | ICD-10-CM | POA: Insufficient documentation

## 2018-05-06 DIAGNOSIS — J45909 Unspecified asthma, uncomplicated: Secondary | ICD-10-CM | POA: Diagnosis not present

## 2018-05-06 DIAGNOSIS — M899 Disorder of bone, unspecified: Secondary | ICD-10-CM | POA: Insufficient documentation

## 2018-05-06 DIAGNOSIS — R0600 Dyspnea, unspecified: Secondary | ICD-10-CM | POA: Insufficient documentation

## 2018-05-06 DIAGNOSIS — R911 Solitary pulmonary nodule: Secondary | ICD-10-CM | POA: Insufficient documentation

## 2018-05-06 DIAGNOSIS — Z9882 Breast implant status: Secondary | ICD-10-CM | POA: Insufficient documentation

## 2018-05-06 LAB — GLUCOSE, CAPILLARY: GLUCOSE-CAPILLARY: 99 mg/dL (ref 70–99)

## 2018-05-06 MED ORDER — FLUDEOXYGLUCOSE F - 18 (FDG) INJECTION
9.3000 | Freq: Once | INTRAVENOUS | Status: AC | PRN
  Administered 2018-05-06: 9.74 via INTRAVENOUS

## 2018-05-07 ENCOUNTER — Ambulatory Visit
Admission: RE | Admit: 2018-05-07 | Discharge: 2018-05-07 | Disposition: A | Discharge status: Home / Self Care | Payer: BC Managed Care – PPO | Source: Ambulatory Visit | Attending: Pulmonary Disease | Admitting: Pulmonary Disease

## 2018-05-07 ENCOUNTER — Ambulatory Visit (HOSPITAL_COMMUNITY): Payer: BC Managed Care – PPO

## 2018-05-07 DIAGNOSIS — R0600 Dyspnea, unspecified: Secondary | ICD-10-CM | POA: Diagnosis not present

## 2018-05-07 DIAGNOSIS — R911 Solitary pulmonary nodule: Secondary | ICD-10-CM | POA: Diagnosis not present

## 2018-05-07 NOTE — Progress Notes (Signed)
*  PRELIMINARY RESULTS* Echocardiogram 2D Echocardiogram has been performed.  Kaylee Gordon 05/07/2018, 10:49 AM

## 2018-05-08 ENCOUNTER — Encounter: Payer: Self-pay | Admitting: Pulmonary Disease

## 2018-05-08 ENCOUNTER — Ambulatory Visit: Payer: BC Managed Care – PPO | Admitting: Pulmonary Disease

## 2018-05-08 VITALS — BP 130/72 | HR 90 | Ht 65.0 in | Wt 179.8 lb

## 2018-05-08 DIAGNOSIS — Z85820 Personal history of malignant melanoma of skin: Secondary | ICD-10-CM | POA: Diagnosis not present

## 2018-05-08 DIAGNOSIS — R0602 Shortness of breath: Secondary | ICD-10-CM | POA: Diagnosis not present

## 2018-05-08 DIAGNOSIS — R918 Other nonspecific abnormal finding of lung field: Secondary | ICD-10-CM | POA: Diagnosis not present

## 2018-05-09 ENCOUNTER — Encounter: Payer: Self-pay | Admitting: Pulmonary Disease

## 2018-05-09 NOTE — Progress Notes (Signed)
Subjective:    Patient ID: Kaylee Gordon, female    DOB: 1961/06/14, 57 y.o.   MRN: [id number]  HPI This is a 57 year old lifelong never smoker who presents here for follow-up of an abnormal CT scan. Her initial visit here was on 4 October. Recall that she had multiple nodules noted on a CT scan of the abdomen and pelvis. We did not have a dedicated CT scan of the chest at that time. She also has a history notable for excision of melanoma in 1983. She underwent CT scan of the chest on 11 October. This showed persistent scattered nodules more on the right and on the left the more dominant nodules are in the lower lobe of the right posteriorly. Previously the largest of these nodules was 11 mm and on the latest CT scan of the chest this is noted to be 12 mm and somewhat speculated. Given her history of melanoma this is worrisome. I reviewed the films independently and showed the films to the patient during her follow-up visit of 16 October. At that time a PET/CT was requested. PET/CT was reviewed with the patient today. PET/CT was done on 21 October. The patient presents today with her husband. The PET/CT showed that the two adjacent right lower lobe nodules are somewhat hyper metabolic and the activity is suspicious for malignancy. Because of the patient's history of prior melanoma excision I recommend that we proceed with biopsy. The patient and her husband understand the rationale. The films were shown to them after independent review.  The patient on her prior visit of the 16th was complaining of dyspnea. He had a 2D Echo which was normal and pulmonary function testing which showed no evidence of obstruction and in essence was normal. During the visit she stated that she was getting short of breath and at the time was hyperventilating. She has issues with anxiety. Once she calmed down her shortness of breath was relieved. I suspect this is due to anxiety.   She has not had orthopnea, paroxysmal  nocturnal dyspnea, chest pain or lower extremity edema. No cough or sputum production. No hemoptysis.    Review of Systems  Constitutional: Positive for fatigue.  HENT: Negative.   Eyes: Negative.   Respiratory: Positive for chest tightness and shortness of breath ( when anxious).   Cardiovascular: Negative.   Gastrointestinal: Positive for abdominal pain.  All other systems reviewed and are negative.      Objective:   Physical Exam Constitutional: She isoriented to person, place, and time. She appearswell-developed she is overweight.Non-toxic appearance. Shedoes not appear ill. No distress. Anxious demeanor HENT:  Head:Normocephalicand atraumatic.  Eyes:Pupils are equal, round, and reactive. No scleral icterus Neck: supple.No JVDpresent. No tracheal deviationpresent. No thyromegalypresent.  Cardiovascular:Normal rate,regular rhythmand normal pulses.No murmurheard. Pulmonary/Chest:Effort normaland breath sounds normal. Noaccessory muscle usageor stridor.No tachypnea. Norespiratory distress. She hasno decreased breath sounds. She hasno wheezes. She hasno rhonchi. She hasno rales.  Abdominal:Soft.Bowel sounds are normal. She exhibitsno distension. There isno tenderness.  Musculoskeletal:Normal range of motion. No edema.  Lymphadenopathy:  She has no cervical adenopathy.  Neurological: She isalertand oriented to person, place, and time. Nocranial nerve deficit.  Skin: Skin iswarmand dry, no rashnoted. She is not diaphoretic. Nocyanosisor clubbing.  Psychiatric: Herbehavior is normal. Her mood appearsanxious. She isnot agitated. Nursing noteand vitalsreviewed.       Assessment & Plan:   1) Lung nodules : Hyper metabolic right lower lobe nodules in a patient with prior melanoma excision. Patient admits that  she did not follow up with regards to this issue, she has been unmonitored in this regard. I recommend that biopsy be  performed. The nodules in question are adjacent to the pleura posteriorly. It is not an optimal position for navigational bronchoscopy as the patient will be a high risk for pneumothorax from this. Recommend a  percutaneous approach as the nodules appear to be more accessible to this approach. Patient will be scheduled for biopsy by interventional radiology. The procedure has been scheduled tentatively for 31 October.  2) Dyspnea: she has developed some issues with dyspnea that are ill characterized. She had a CT scan of the chest with contrast on 11 October that did not show any PE. She has had 2D Echo and pulmonary function testing has been unremarkable. The patient and her husband seem to believe that this may be related to issues with anxiety and indeed she did have an episode in the office after being told of the need of biopsy. The dyspnea resolved after she was able to calm down. I suspect this is related to anxiety.  3) History of malignant melanoma: this issue adds complexity to her management and raises suspicions of potential metastatic disease. Workup as above.  We will see her in follow-up after biopsy is done, she is to contact us prior to that time should any new difficulties arise. She was provided with instructions for all the above.

## 2018-05-15 ENCOUNTER — Other Ambulatory Visit: Payer: Self-pay | Admitting: Student

## 2018-05-16 ENCOUNTER — Ambulatory Visit
Admission: RE | Admit: 2018-05-16 | Discharge: 2018-05-16 | Disposition: A | Discharge status: Home / Self Care | Payer: BC Managed Care – PPO | Source: Ambulatory Visit | Attending: Interventional Radiology | Admitting: Interventional Radiology

## 2018-05-16 ENCOUNTER — Ambulatory Visit
Admission: RE | Admit: 2018-05-16 | Discharge: 2018-05-16 | Disposition: A | Discharge status: Home / Self Care | Payer: BC Managed Care – PPO | Source: Ambulatory Visit | Attending: Pulmonary Disease | Admitting: Pulmonary Disease

## 2018-05-16 DIAGNOSIS — R918 Other nonspecific abnormal finding of lung field: Secondary | ICD-10-CM | POA: Diagnosis not present

## 2018-05-16 DIAGNOSIS — Z9889 Other specified postprocedural states: Secondary | ICD-10-CM | POA: Insufficient documentation

## 2018-05-16 LAB — CBC
HEMATOCRIT: 37.7 % (ref 36.0–46.0)
Hemoglobin: 12.1 g/dL (ref 12.0–15.0)
MCH: 29.8 pg (ref 26.0–34.0)
MCHC: 32.1 g/dL (ref 30.0–36.0)
MCV: 92.9 fL (ref 80.0–100.0)
NRBC: 0 % (ref 0.0–0.2)
PLATELETS: 275 10*3/uL (ref 150–400)
RBC: 4.06 MIL/uL (ref 3.87–5.11)
RDW: 12.5 % (ref 11.5–15.5)
WBC: 6.3 10*3/uL (ref 4.0–10.5)

## 2018-05-16 MED ORDER — SODIUM CHLORIDE 0.9 % IV SOLN: INTRAVENOUS | Status: DC

## 2018-05-16 MED ORDER — MIDAZOLAM HCL 5 MG/5ML IJ SOLN
INTRAMUSCULAR | Status: AC
  Filled 2018-05-16: qty 5

## 2018-05-16 MED ORDER — FENTANYL CITRATE (PF) 100 MCG/2ML IJ SOLN
INTRAMUSCULAR | Status: AC | PRN
  Administered 2018-05-16 (×2): 25 ug via INTRAVENOUS

## 2018-05-16 MED ORDER — FENTANYL CITRATE (PF) 100 MCG/2ML IJ SOLN
INTRAMUSCULAR | Status: AC
  Filled 2018-05-16: qty 4

## 2018-05-16 MED ORDER — MIDAZOLAM HCL 5 MG/5ML IJ SOLN
INTRAMUSCULAR | Status: AC | PRN
  Administered 2018-05-16 (×2): 0.5 mg via INTRAVENOUS

## 2018-05-16 NOTE — Discharge Instructions (Signed)
Needle Biopsy of the Lung, Care After °This sheet gives you information about how to care for yourself after your procedure. Your health care provider may also give you more specific instructions. If you have problems or questions, contact your health care provider. °What can I expect after the procedure? °After the procedure, it is common to have: °· Soreness, pain, and tenderness where a tissue sample was taken (biopsy site). °· A cough. °· A sore throat. ° °Follow these instructions at home: °Biopsy site care °· Follow instructions from your health care provider about when to remove the bandage that was placed on the biopsy site. °· Keep the bandage dry until it has been removed. °· Check your biopsy site every day for signs of infection. Check for: °? More redness, swelling, or pain. °? More fluid or blood. °? Warmth to the touch. °? Pus or a bad smell. °General instructions °· Rest as directed by your health care provider. Ask your health care provider what activities are safe for you. °· Do not take baths, swim, or use a hot tub until your health care provider approves. °· Take over-the-counter and prescription medicines only as told by your health care provider. °· If you have airplane travel scheduled, talk with your health care provider about when it is safe for you to travel by airplane. °· It is up to you to get the results of your procedure. Ask your health care provider, or the department that is doing the procedure, when your results will be ready. °· Keep all follow-up visits as told by your health care provider. This is important. °Contact a health care provider if: °· You have more redness, swelling, or pain around your biopsy site. °· You have more fluid or blood coming from your biopsy site. °· Your biopsy site feels warm to the touch. °· You have pus or a bad smell coming from your biopsy site. °· You have a fever. °· You have pain that does not get better with medicine. °Get help right away  if: °· You have problems breathing. °· You have chest pain. °· You cough up blood. °· You faint. °· You have a fast heart rate. °Summary °· After a needle biopsy of the lung, it is common to have a cough, a sore throat, or soreness, pain, and tenderness where a tissue sample was taken (biopsy site). °· You should check your biopsy area every day for signs of infection, including pus or a bad smell, warmth, more fluid or blood, or more redness, swelling, or pain. °· You should not take baths, swim, or use a hot tub until your health care provider approves. °· It is up to you to get the results of your procedure. Ask your health care provider, or the department that is doing the procedure, when your results will be ready. °This information is not intended to replace advice given to you by your health care provider. Make sure you discuss any questions you have with your health care provider. °Document Released: 04/30/2007 Document Revised: 05/24/2016 Document Reviewed: 05/24/2016 °[employer] Interactive Patient Education © 2017 [employer] ° ° °

## 2018-05-16 NOTE — Progress Notes (Signed)
CxR performed at bedside now.

## 2018-05-16 NOTE — Progress Notes (Signed)
Dr. Pascal Lux at bedside speaking with pt. And her husband re: procedure. Made aware Cxr to be done around noon. Both verbalize understanding.

## 2018-05-16 NOTE — Consult Note (Signed)
Chief Complaint: Indeterminate hypermetabolic right lower lobe pulmonary nodules  Referring Physician(s): Gonzalez,Carmen L  Patient Status: [facility] - Out-pt  History of Present Illness: Kaylee Gordon is a 57 y.o. female with past medical history significant for asthma, congestive heart failure, hypertension, thyroid disease and skin cancer who was found to have indeterminate right lower lobe pulmonary nodules on CT scan performed 04/17/2018 for the work-up of abdominal pain and nausea.  These lesions were subsequently confirmed on dedicated chest CT performed 04/26/2018 and found to be hypermetabolic on PET/CT performed 05/06/2018.  Patient presents today for CT-guided right lower lobe pulmonary nodule biopsy.  She is accompanied by her husband though serves as her own historian.  Patient continues to complain of abdominal pain, nausea and diarrhea though denies any pulmonary symptoms.  Specifically, no chest pain, cough or shortness of breath.  Patient is otherwise without complaint.  Specifically, no change in appetite or energy level.  No unintentional weight loss.  Past Medical History:  Diagnosis Date  . Anxiety   . Asthma   . Cancer [facility])    SKIN CANCER  . CHF (congestive heart failure) ([place])   . Depression   . Hypertension   . Thyroid disease     Past Surgical History:  Procedure Laterality Date  . ANKLE SURGERY Right 2012  . APPENDECTOMY    . AUGMENTATION MAMMAPLASTY Bilateral   . BREAST ENHANCEMENT SURGERY    . COLONOSCOPY WITH PROPOFOL N/A 02/22/2015   Procedure: COLONOSCOPY WITH PROPOFOL;  Surgeon: Josefine Class, MD;  Location: [facility] ENDOSCOPY;  Service: Endoscopy;  Laterality: N/A;  . ESOPHAGOGASTRODUODENOSCOPY (EGD) WITH PROPOFOL N/A 02/22/2015   Procedure: ESOPHAGOGASTRODUODENOSCOPY (EGD) WITH PROPOFOL;  Surgeon: Josefine Class, MD;  Location: [facility] ENDOSCOPY;  Service: Endoscopy;  Laterality: N/A;  . ESOPHAGOGASTRODUODENOSCOPY (EGD) WITH PROPOFOL N/A  02/11/2016   Procedure: ESOPHAGOGASTRODUODENOSCOPY (EGD) WITH PROPOFOL;  Surgeon: Manya Silvas, MD;  Location[facility] ENDOSCOPY;  Service: Endoscopy;  Laterality: N/A;  . HEMORRHOID SURGERY    . NASAL SINUS SURGERY    . SAVORY DILATION N/A 02/22/2015   Procedure: SAVORY DILATION;  Surgeon: Josefine Class, MD;  Location: [facility] ENDOSCOPY;  Service: Endoscopy;  Laterality: N/A;  . SAVORY DILATION N/A 02/11/2016   Procedure: SAVORY DILATION;  Surgeon: Manya Silvas, MD;  Location: [facility] ENDOSCOPY;  Service: Endoscopy;  Laterality: N/A;  . SKIN CANCER EXCISION      Allergies: Atorvastatin; Parafon forte dsc [chlorzoxazone]; Pollen extract; and Sudafed [pseudoephedrine hcl]  Medications: Prior to Admission medications   Medication Sig Start Date End Date Taking? Authorizing Provider  albuterol (PROVENTIL, VENTOLIN) (5 MG/ML) 0.5% NEBU Inhale into the lungs. Reported on 07/07/2015   Yes [provider]  calcitRIOL (ROCALTROL) 0.25 MCG capsule TAKE ONE CAPSULE BY MOUTH DAILY 05/06/18  Yes Elayne Snare, MD  DULoxetine (CYMBALTA) 20 MG capsule Take 3 capsules (60 mg total) by mouth daily. 04/16/18 04/16/19 Yes Ravi, Himabindu, MD  fluticasone (FLONASE) 50 MCG/ACT nasal spray  12/25/12  Yes [provider]  levothyroxine (SYNTHROID, LEVOTHROID) 50 MCG tablet TAKE 1.5 TABLETS BY MOUTH DAILY 03/06/18  Yes Elayne Snare, MD  lisinopril (PRINIVIL,ZESTRIL) 5 MG tablet TAKE 1 TABLET BY MOUTH ONCE A DAY 09/11/14  Yes [provider]  lovastatin (MEVACOR) 20 MG tablet Take 20 mg by mouth at bedtime.   Yes [provider]  pantoprazole (PROTONIX) 40 MG tablet  03/25/18  Yes [provider]  propranolol (INDERAL) 20 MG tablet Take 20 mg by mouth 2 (  two) times daily.    Yes [provider]  QUEtiapine (SEROQUEL) 100 MG tablet TAKE 1 TABLETS BY MOUTH EVERY NIGHT AT BEDTIME 04/16/18  Yes Ravi, Himabindu, MD  gabapentin (NEURONTIN) 100 MG capsule Take 1 capsule (100  mg total) by mouth 2 (two) times daily. Patient taking differently: Take 100 mg by mouth 3 (three) times daily.  12/21/16 12/21/17  Elvin So, MD     Family History  Problem Relation Age of Onset  . Cancer Mother   . Depression Mother   . Anxiety disorder Mother   . Heart disease Father   . Cancer Father   . Anxiety disorder Father   . Depression Father   . Hypoparathyroidism Son     Social History   Socioeconomic History  . Marital status: Married    Spouse name: Legrand Como  . Number of children: Not on file  . Years of education: Not on file  . Highest education level: Not on file  Occupational History  . Not on file  Social Needs  . Financial resource strain: Not hard at all  . Food insecurity:    Worry: Never true    Inability: Never true  . Transportation needs:    Medical: No    Non-medical: No  Tobacco Use  . Smoking status: Never Smoker  . Smokeless tobacco: Never Used  Substance and Sexual Activity  . Alcohol use: No    Alcohol/week: 0.0 standard drinks    Comment: MAYBE ONCE OR TWICE A MONTH  . Drug use: No  . Sexual activity: Not Currently    Partners: Male  Lifestyle  . Physical activity:    Days per week: 0 days    Minutes per session: Not on file  . Stress: Very much  Relationships  . Social connections:    Talks on phone: Twice a week    Gets together: Twice a week    Attends religious service: More than 4 times per year    Active member of club or organization: Not on file    Attends meetings of clubs or organizations: Not on file    Relationship status: Married  Other Topics Concern  . Not on file  Social History Narrative  . Not on file    ECOG Status: 1 - Symptomatic but completely ambulatory  Review of Systems: A 12 point ROS discussed and pertinent positives are indicated in the HPI above.  All other systems are negative.  Review of Systems  Constitutional: Negative for activity change, appetite change, fatigue and fever.    Respiratory: Negative for cough and shortness of breath.   Cardiovascular: Negative.   Gastrointestinal: Positive for abdominal pain, diarrhea and nausea.    Vital Signs: BP 109/60   Temp 98.6 F (37 C) (Oral)   Resp 20   Ht 5\' 5"  (1.651 m)   Wt 81.5 kg   SpO2 96%   BMI 29.90 kg/m   Physical Exam  Constitutional: She appears well-developed and well-nourished.  HENT:  Head: Normocephalic and atraumatic.  Cardiovascular: Normal rate and regular rhythm.  Pulmonary/Chest: Effort normal and breath sounds normal.  Nursing note and vitals reviewed.   Imaging: Ct Chest W Contrast  Result Date: 04/26/2018 CLINICAL DATA:  Follow-up pulmonary nodules from recent CT of the abdomen and pelvis. EXAM: CT CHEST WITH CONTRAST TECHNIQUE: Multidetector CT imaging of the chest was performed during intravenous contrast administration. CONTRAST:  42mL ISOVUE-300 IOPAMIDOL (ISOVUE-300) INJECTION 61% COMPARISON:  CT of the abdomen and pelvis  from 04/17/2018 FINDINGS: Cardiovascular: Thoracic aorta shows no aneurysmal dilatation or dissection. No cardiac enlargement is seen. No coronary calcifications are noted. No central pulmonary embolism is seen. Mediastinum/Nodes: Thoracic inlet is within normal limits. The esophagus is unremarkable with the exception of a moderate-sized sliding-type hiatal hernia. 10 mm right paratracheal lymph node is seen. No other significant lymphadenopathy is noted. Lungs/Pleura: Lungs are well aerated bilaterally. Scattered nodules are identified right greater than left. Dominant nodules again lie in the lower lobe on the right posteriorly. The largest of these measures approximately 12 mm on today's thinner slices. Some mild spiculation is noted surrounding this larger nodule. No sizable effusion or pneumothorax is noted. Upper Abdomen: Visualized upper abdomen reveals fatty infiltration of the liver. No other focal abnormality in the upper abdomen is seen. Musculoskeletal:  Degenerative changes of the thoracic spine are noted. Bilateral breast implants are noted. IMPRESSION: Multiple pulmonary nodules identified throughout both lungs. The largest of these measures approximately 12 mm on today's thinner sections. Mild spiculation is noted. Non-contrast chest CT at 3-6 months is recommended. If the nodules are stable at time of repeat CT, then future CT at 18-24 months (from today's scan) is considered optional for low-risk patients, but is recommended for high-risk patients. This recommendation follows the consensus statement: Guidelines for Management of Incidental Pulmonary Nodules Detected on CT Images: From the Fleischner Society 2017; Radiology 2017; 284:228-243. Electronically Signed   By: Inez Catalina M.D.   On: 04/26/2018 14:49   Ct Abdomen Pelvis W Contrast  Result Date: 04/17/2018 CLINICAL DATA:  Generalized epigastric pain. Nausea and shortness of breath. Previous appendectomy. History of melanoma EXAM: CT ABDOMEN AND PELVIS WITH CONTRAST TECHNIQUE: Multidetector CT imaging of the abdomen and pelvis was performed using the standard protocol following bolus administration of intravenous contrast. CONTRAST:  122mL ISOVUE-300 IOPAMIDOL (ISOVUE-300) INJECTION 61% COMPARISON:  06/01/2015 FINDINGS: Lower chest: There are a cluster of pulmonary nodules posteriorly in the right lower lobe, the largest measuring 11 mm in size. Clustered nature would raise concern primarily for inflammatory disease, but in the setting of melanoma, metastatic disease is not excluded. In the medial left lower lobe, there is a 9 mm density which could be a scar or inflammatory focus but, again, in this setting could be a melanoma metastasis. There is no pleural fluid. There is a moderate sized hiatal hernia. Hepatobiliary: Liver parenchyma is normal.  No calcified gallstones. Pancreas: Normal Spleen: Normal Adrenals/Urinary Tract: Adrenal glands are normal. Incompletely rotated kidneys with  chronically prominent renal pelvis on each side. No hydroureteronephrosis. Bladder appears normal. Stomach/Bowel: No abnormal bowel finding. Vascular/Lymphatic: Aorta is normal. IVC is normal. No retroperitoneal adenopathy. There are a cluster of lymph nodes at the root of the mesentery which measure short axis diameter up to 7-8 mm. These are larger than were seen on the previous exam, with the exception of 1 node which is actually smaller than was seen on the previous exam. Reproductive: Assess uterus and adnexal regions are unremarkable. Other: No free fluid. Musculoskeletal: Negative IMPRESSION: Cluster of pulmonary nodules in the posterior aspect of the right lower lobe. Single density in the medial left lower lobe. These are indeterminate for inflammatory foci versus metastatic disease. In the setting of melanoma, they are of concern. Enlargement of a cluster of mesenteric lymph nodes at the root of the mesentery, short axis diameter up to 7-8 mm. There is actually 1 mesenteric node just to the left of midline that is smaller than was seen in 2016.  The others are larger. These are also viewed with suspicion. Hiatal hernia. No other solid organ finding of note. Electronically Signed   By: Nelson Chimes M.D.   On: 04/17/2018 16:01   Nm Pet Image Initial (pi) Skull Base To Thigh  Result Date: 05/06/2018 CLINICAL DATA:  Initial treatment strategy for solitary pulmonary nodule. History of malignant melanoma. EXAM: NUCLEAR MEDICINE PET SKULL BASE TO THIGH TECHNIQUE: 9.7 mCi F-18 FDG was injected intravenously. Full-ring PET imaging was performed from the skull base to thigh after the radiotracer. CT data was obtained and used for attenuation correction and anatomic localization. Fasting blood glucose: 99 mg/dl COMPARISON:  Multiple exams, including chest CT from 04/26/2018 FINDINGS: Mediastinal blood pool activity: SUV max 3.1 NECK: Symmetric tonsillar activity is likely physiologic. Incidental CT findings:  Calcifications of the lentiform nuclei appear symmetric and are likely physiologic. CHEST: The 1.4 by 1.0 cm right lower lobe nodule on image 103/3 has a maximum SUV of 2.9. The adjacent 1.7 by 1.3 cm right lower lobe nodule on image 105/3 has a maximum SUV of 2.7. The other smaller, pleural-based nodules in the lungs are not appreciably hypermetabolic on today's exam. Incidental CT findings: Bilateral subpectoral breast implants. Incidental fluid in the superior pericardial recess. Small type 1 hiatal hernia. ABDOMEN/PELVIS: Accentuated anal activity in the vicinity of a small hyperdense suture line, maximum SUV 8.1. This could be physiologic but is technically nonspecific. Small mesenteric lymph nodes just above the level of the iliac crests are not appreciably hypermetabolic. An ileocolic node measuring 8 mm in short axis on image 173/3 has a maximum SUV of 1.4. Adjacent node has a maximum SUV of 2.0. Incidental CT findings: Aortoiliac atherosclerotic vascular disease. 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 No overt hydronephrosis. SKELETON: No significant abnormal hypermetabolic activity in this region. 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 There is some cortical sclerosis laterally in the left third rib and focally in the left seventh rib, not hypermetabolic. Similar small sclerotic lesion in the right second rib, not appreciably hypermetabolic. Small chronically stable sclerotic lesion favoring bone island in the right acetabulum. Lucent lesion in the left femoral head is chronically stable from 2016 and probably an enchondroma or similar lesion. IMPRESSION: 1. The 2 adjacent right lower lobe nodules are mildly hypermetabolic, with maximum SUV 2.9. Appearance is suspicious for malignancy and tissue diagnosis is recommended. 2. Other smaller pleural based nodules in the lungs are not appreciably hypermetabolic but most are below sensitive PET-CT  size thresholds. 3. Accentuated metabolic activity in the vicinity of the anus may be physiologic but is technically nonspecific. 4. The upper normal size mesenteric lymph nodes are not hypermetabolic. 5. Other imaging findings of potential clinical significance: Aortic Atherosclerosis (ICD10-I70.0). 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 Several small sclerotic bony lesions are not hypermetabolic and are likely benign/incidental. Electronically Signed   By: Van Clines M.D.   On: 05/06/2018 11:20    Labs:  CBC: Recent Labs    05/16/18 0936  WBC 6.3  HGB 12.1  HCT 37.7  PLT 275    COAGS: No results for input(s): INR, APTT in the last 8760 hours.  BMP: Recent Labs    10/29/17 1044 04/03/18 1419  NA 140 140  K 3.5 3.6  CL 105 104  CO2 24 29  GLUCOSE 127* 114*  BUN 16 16  CALCIUM 9.4 9.3  CREATININE 0.86 0.94    LIVER FUNCTION TESTS:  No results for input(s): BILITOT, AST, ALT, ALKPHOS, PROT, ALBUMIN in the last 8760 hours.  TUMOR MARKERS: No results for input(s): AFPTM, CEA, CA199, CHROMGRNA in the last 8760 hours.  Assessment and Plan:  DEEKSHA COTRELL is a 57 y.o. female with past medical history significant for asthma, congestive heart failure, hypertension, thyroid disease and skin cancer who was found to have indeterminate right lower lobe pulmonary nodules on CT scan performed 04/17/2018 for the work-up of abdominal pain and nausea.  These lesions were subsequently confirmed on dedicated chest CT performed 04/26/2018 and found to be hypermetabolic on PET/CT performed 05/06/2018.  Patient presents today for CT-guided right lower lobe pulmonary nodule biopsy.    Patient continues to complain of abdominal pain, nausea and diarrhea though denies any pulmonary symptoms.    Risks and benefits of CT guided right lower lobe pulmonary nodule biopsy was discussed with the patient including, but not limited to bleeding, infection, damage to adjacent structures or  low yield requiring additional tests.  All of the patient's questions were answered, patient is agreeable to proceed.  Consent signed and in chart.  Thank you for this interesting consult.  I greatly enjoyed meeting Kaylee Gordon and look forward to participating in their care.  A copy of this report was sent to the requesting provider on this date.  Electronically Signed: Sandi Mariscal, MD 05/16/2018, 10:16 AM   I spent a total of 15 Minutes in face to face in clinical consultation, greater than 50% of which was counseling/coordinating care for CT-guided right lower lobe pulmonary nodule biopsy

## 2018-05-16 NOTE — Procedures (Signed)
Pre procedural Dx: Hypermetabolic right lower lobe pulmonary nodule  Post procedural Dx: Same  Technically successful CT guided biopsy of indeterminate nodule within the right lower lobe.    EBL: None.   Complications: None immediate.   Ronny Bacon, MD Pager #: [phone]

## 2018-05-17 LAB — SURGICAL PATHOLOGY

## 2018-05-21 ENCOUNTER — Ambulatory Visit (INDEPENDENT_AMBULATORY_CARE_PROVIDER_SITE_OTHER): Payer: BC Managed Care – PPO | Admitting: Pulmonary Disease

## 2018-05-21 ENCOUNTER — Encounter: Payer: Self-pay | Admitting: Pulmonary Disease

## 2018-05-21 VITALS — BP 118/72 | HR 90 | Ht 65.0 in | Wt 179.6 lb

## 2018-05-21 DIAGNOSIS — R918 Other nonspecific abnormal finding of lung field: Secondary | ICD-10-CM | POA: Diagnosis not present

## 2018-05-21 DIAGNOSIS — Z85820 Personal history of malignant melanoma of skin: Secondary | ICD-10-CM

## 2018-05-21 DIAGNOSIS — T17908S Unspecified foreign body in respiratory tract, part unspecified causing other injury, sequela: Secondary | ICD-10-CM

## 2018-05-21 NOTE — Progress Notes (Signed)
   Subjective:    Patient ID: Kaylee Gordon, female    DOB: 20-Feb-1961, 57 y.o.   MRN: [id number]  HPI The patient is a 57 year old lifelong never smoker, who presents for follow-up after percutaneous biopsy of an indeterminate 1.5 cm nodule on the right lower lobe. As was done on October 31. The patient has a history of excision of melanoma in 1983. Pathology of the biopsy shows that the findings are consistent with inflammation, there are foamy macrophages that sometimes can be seen with chronic silent aspiration. This fits the patient's history as she has issues with gastroesophageal reflux that are not well controlled with Protonix. She continues to have frank regurgitation particularly at nighttime. She does have a history of hiatal hernia. As her last visit she has not noticed much on the way of dyspnea.  Of note chronic silent aspiration can lead to issues with bronchiolitis and evanescent inflammatory lung nodules.   Review of Systems  Constitutional: Negative.   HENT: Positive for sore throat.   Respiratory: Negative.   Cardiovascular: Positive for palpitations.  Gastrointestinal:       Persistent issues with heartburn and gastroesophageal reflux.  Neurological: Negative.   Psychiatric/Behavioral: The patient is nervous/anxious.   All other systems reviewed and are negative.      Objective:   Physical Exam  Constitutional: She is oriented to person, place, and time. She appears well-developed.  Non-toxic appearance. She does not appear ill.  Overweight  HENT:  Head: Normocephalic and atraumatic.  Eyes: Pupils are equal, round, and reactive to light. No scleral icterus.  Neck: Neck supple.  Cardiovascular: Normal rate, regular rhythm and normal heart sounds.  Pulmonary/Chest: Effort normal and breath sounds normal.  Abdominal: Soft. She exhibits no distension.  Musculoskeletal: Normal range of motion. She exhibits no edema.  Neurological: She is alert and oriented to  person, place, and time.  No focal deficits  Skin: Skin is warm and dry.  Psychiatric: Her speech is normal. Her mood appears anxious.  Nursing note and vitals reviewed.         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 The patient has issues with persistent gastroesophageal reflux symptoms, hiatal hernia; she has frank reflux particularly at night. The best management for this is to tend to her G.I. issues and this usually tends to control DAB. In any event, we will follow her lung nodules expectantly. She will have a CT scan of the chest repeated and another 3 to 4 months time. That will determine follow-up further down the line.  2) Dyspnea: she does not report issues with dyspnea at this visit. Previously she associated these with anxiety. She has had workup with PFTs and 2D Echo that have been essentially benign.  3) History of malignant melanoma: this issue adds complexity to her management. Present there is no evidence of recurrence or metastases.

## 2018-05-23 ENCOUNTER — Ambulatory Visit: Payer: BC Managed Care – PPO | Admitting: Pulmonary Disease

## 2018-05-24 ENCOUNTER — Other Ambulatory Visit: Payer: Self-pay | Admitting: Gastroenterology

## 2018-05-24 DIAGNOSIS — R1310 Dysphagia, unspecified: Secondary | ICD-10-CM

## 2018-05-24 DIAGNOSIS — R9389 Abnormal findings on diagnostic imaging of other specified body structures: Secondary | ICD-10-CM

## 2018-05-28 ENCOUNTER — Ambulatory Visit: Payer: BC Managed Care – PPO | Admitting: Pulmonary Disease

## 2018-05-29 ENCOUNTER — Ambulatory Visit: Payer: BC Managed Care – PPO

## 2018-05-30 ENCOUNTER — Ambulatory Visit: Payer: BC Managed Care – PPO | Admitting: Psychiatry

## 2018-06-06 ENCOUNTER — Encounter: Payer: Self-pay | Admitting: Psychiatry

## 2018-06-06 ENCOUNTER — Other Ambulatory Visit: Payer: Self-pay

## 2018-06-06 ENCOUNTER — Other Ambulatory Visit: Payer: Self-pay | Admitting: Endocrinology

## 2018-06-06 ENCOUNTER — Ambulatory Visit: Payer: BC Managed Care – PPO | Admitting: Psychiatry

## 2018-06-06 VITALS — BP 116/81 | HR 86 | Temp 97.7°F | Wt 181.6 lb

## 2018-06-06 DIAGNOSIS — F331 Major depressive disorder, recurrent, moderate: Secondary | ICD-10-CM

## 2018-06-06 DIAGNOSIS — F3130 Bipolar disorder, current episode depressed, mild or moderate severity, unspecified: Secondary | ICD-10-CM

## 2018-06-06 DIAGNOSIS — F411 Generalized anxiety disorder: Secondary | ICD-10-CM

## 2018-06-06 MED ORDER — DULOXETINE HCL 20 MG PO CPEP: 60.0000 mg | ORAL_CAPSULE | Freq: Every day | ORAL | 1 refills | Status: DC

## 2018-06-06 MED ORDER — QUETIAPINE FUMARATE 100 MG PO TABS: ORAL_TABLET | ORAL | 2 refills | Status: DC

## 2018-06-06 NOTE — Progress Notes (Signed)
Patient ID: Kaylee Gordon, female   DOB: 1960/11/02, 57 y.o.   MRN: [id number]    [facility] MD/PA/NP OP Progress Note  06/06/2018 1:41 PM Kaylee Gordon  MRN:  [id number]  Subjective:  Patient returns for follow-up of anxiety and major depression. She reports today that all her lung tests were negative for cancer and she is undergoing some further tests to see the reasons for the information that led to the nodules.  Patient also reports that she has been shopping a lot and needs to find a job to support her shopping habits.  Continues to report feeling tired and achy all the time.  We discussed that patient should work on strategies to cut back her shopping rather than make more money to support her shopping habit.  Patient is agreeable to this plan and states that she has applied to some volunteer jobs.   Denies any suicidal thoughts.  Chief Complaint: mood is okay  Chief Complaint    Follow-up; Medication Refill     Visit Diagnosis:     ICD-10-CM   1. Bipolar I disorder, most recent episode depressed ([place]) F31.30   2. GAD (generalized anxiety disorder) F41.1   3. Major depressive disorder, recurrent episode, moderate (HCC) F33.1     Past Medical History:  Past Medical History:  Diagnosis Date  . Anxiety   . Asthma   . Cancer [facility])    SKIN CANCER  . CHF (congestive heart failure) ([place])   . Depression   . Hypertension   . Thyroid disease     Past Surgical History:  Procedure Laterality Date  . ANKLE SURGERY Right 2012  . APPENDECTOMY    . AUGMENTATION MAMMAPLASTY Bilateral   . BREAST ENHANCEMENT SURGERY    . COLONOSCOPY WITH PROPOFOL N/A 02/22/2015   Procedure: COLONOSCOPY WITH PROPOFOL;  Surgeon: Josefine Class, MD;  Location: [facility] ENDOSCOPY;  Service: Endoscopy;  Laterality: N/A;  . ESOPHAGOGASTRODUODENOSCOPY (EGD) WITH PROPOFOL N/A 02/22/2015   Procedure: ESOPHAGOGASTRODUODENOSCOPY (EGD) WITH PROPOFOL;  Surgeon: Josefine Class, MD;  Location: [facility] ENDOSCOPY;   Service: Endoscopy;  Laterality: N/A;  . ESOPHAGOGASTRODUODENOSCOPY (EGD) WITH PROPOFOL N/A 02/11/2016   Procedure: ESOPHAGOGASTRODUODENOSCOPY (EGD) WITH PROPOFOL;  Surgeon: Manya Silvas, MD;  Location[facility] ENDOSCOPY;  Service: Endoscopy;  Laterality: N/A;  . HEMORRHOID SURGERY    . NASAL SINUS SURGERY    . SAVORY DILATION N/A 02/22/2015   Procedure: SAVORY DILATION;  Surgeon: Josefine Class, MD;  Location: [facility] ENDOSCOPY;  Service: Endoscopy;  Laterality: N/A;  . SAVORY DILATION N/A 02/11/2016   Procedure: SAVORY DILATION;  Surgeon: Manya Silvas, MD;  Location: [facility] ENDOSCOPY;  Service: Endoscopy;  Laterality: N/A;  . SKIN CANCER EXCISION     Family History:  Family History  Problem Relation Age of Onset  . Cancer Mother   . Depression Mother   . Anxiety disorder Mother   . Heart disease Father   . Cancer Father   . Anxiety disorder Father   . Depression Father   . Hypoparathyroidism Son    Social History:  Social History   Socioeconomic History  . Marital status: Married    Spouse name: Legrand Como  . Number of children: Not on file  . Years of education: Not on file  . Highest education level: Not on file  Occupational History  . Not on file  Social Needs  . Financial resource strain: Not hard at all  . Food insecurity:    Worry: Never true  Inability: Never true  . Transportation needs:    Medical: No    Non-medical: No  Tobacco Use  . Smoking status: Never Smoker  . Smokeless tobacco: Never Used  Substance and Sexual Activity  . Alcohol use: No    Alcohol/week: 0.0 standard drinks    Comment: MAYBE ONCE OR TWICE A MONTH  . Drug use: No  . Sexual activity: Not Currently    Partners: Male  Lifestyle  . Physical activity:    Days per week: 0 days    Minutes per session: Not on file  . Stress: Very much  Relationships  . Social connections:    Talks on phone: Twice a week    Gets together: Twice a week    Attends religious service: More than 4  times per year    Active member of club or organization: Not on file    Attends meetings of clubs or organizations: Not on file    Relationship status: Married  Other Topics Concern  . Not on file  Social History Narrative  . Not on file   Additional History:   Musculoskeletal: Strength & Muscle Tone: within normal limits Gait & Station: normal Patient leans: N/A  Psychiatric Specialty Exam: Medication Refill   Anxiety  Patient reports no insomnia, nervous/anxious behavior or suicidal ideas.    Depression         Associated symptoms include does not have insomnia and no suicidal ideas.  Past medical history includes anxiety.   Insomnia  PMH includes: no depression.    Review of Systems  Psychiatric/Behavioral: Negative for depression, hallucinations, memory loss, substance abuse and suicidal ideas. The patient is not nervous/anxious and does not have insomnia.   All other systems reviewed and are negative.   Blood pressure 116/81, pulse 86, temperature 97.7 F (36.5 C), temperature source Oral, weight 181 lb 9.6 oz (82.4 kg).Body mass index is 30.22 kg/m.  General Appearance: Neat and Well Groomed  Eye Contact:  Good  Speech:  Clear and Coherent and Normal Rate  Volume:  Normal  Mood: Okay  Affect:   Pleasant  Thought Process:  Circumstantial  Orientation:  Full (Time, Place, and Person)  Thought Content:  Negative  Suicidal Thoughts:  No  Homicidal Thoughts:  No  Memory:  Immediate;   Good Recent;   Good Remote;   Good  Judgement:  Good  Insight:  Good  Psychomotor Activity:  Negative and Normal  Concentration:  Good  Recall:  Good  Fund of Knowledge: Good  Language: Good  Akathisia:  Negative  Handed:  Right  AIMS (if indicated):  Not done  Assets:  Communication Skills Desire for Improvement Vocational/Educational  ADL's:  Intact  Cognition: WNL  Sleep:  ok   Is the patient at risk to self?  No. Has the patient been a risk to self in the past 6  months?  No. Has the patient been a risk to self within the distant past?  No. Is the patient a risk to others?  No. Has the patient been a risk to others in the past 6 months?  No. Has the patient been a risk to others within the distant past?  No.  Current Medications: Current Outpatient Medications  Medication Sig Dispense Refill  . albuterol (PROVENTIL, VENTOLIN) (5 MG/ML) 0.5% NEBU Inhale into the lungs. Reported on 07/07/2015    . calcitRIOL (ROCALTROL) 0.25 MCG capsule TAKE ONE CAPSULE BY MOUTH DAILY 30 capsule 1  . DULoxetine (CYMBALTA) 20  MG capsule Take 3 capsules (60 mg total) by mouth daily. 90 capsule 1  . fluticasone (FLONASE) 50 MCG/ACT nasal spray     . levothyroxine (SYNTHROID, LEVOTHROID) 50 MCG tablet TAKE ONE AND ONE-HALF TABLET BY MOUTH DAILY 45 tablet 1  . lisinopril (PRINIVIL,ZESTRIL) 5 MG tablet TAKE 1 TABLET BY MOUTH ONCE A DAY    . lovastatin (MEVACOR) 20 MG tablet Take 20 mg by mouth at bedtime.    . pantoprazole (PROTONIX) 40 MG tablet     . propranolol (INDERAL) 20 MG tablet Take 20 mg by mouth 2 (two) times daily.     . QUEtiapine (SEROQUEL) 100 MG tablet TAKE 1 TABLETS BY MOUTH EVERY NIGHT AT BEDTIME 30 tablet 2  . gabapentin (NEURONTIN) 100 MG capsule Take 1 capsule (100 mg total) by mouth 2 (two) times daily. (Patient taking differently: Take 100 mg by mouth 3 (three) times daily. ) 60 capsule 2   No current facility-administered medications for this visit.     Medical Decision Making:  Established Problem, Stable/Improving (1)  Treatment Plan Summary:  Major depressive disorder- Continue cymbalta at 60mg  daily.   Continue Seroquel at 100 mg at bedtime.    Generalized anxiety disorder- Same  As above.  Continue seeing her therapist weekly to discuss issues and focus on reducing her spending. Encourage starting mindfulness-based stress reduction program at [facility] follow-up in 2 months. she's been encouraged to call with questions concerns  prior to her next appointment. Patient aware that this clinician will be away and she will be seeing a different physician at her next appointment.  Maryjane Benedict 06/06/2018, 1:41 PM

## 2018-06-10 ENCOUNTER — Ambulatory Visit
Admission: RE | Admit: 2018-06-10 | Discharge: 2018-06-10 | Disposition: A | Discharge status: Home / Self Care | Payer: BC Managed Care – PPO | Source: Ambulatory Visit | Attending: Gastroenterology | Admitting: Gastroenterology

## 2018-06-10 DIAGNOSIS — R1310 Dysphagia, unspecified: Secondary | ICD-10-CM | POA: Diagnosis not present

## 2018-06-10 NOTE — Therapy (Addendum)
[place], [place], [address] Phone: [phone]   Fax:     Modified Barium Swallow  Patient Details  Name: Kaylee Gordon MRN: [id number] Date of Birth: 10-03-1960 No data recorded  Encounter Date: 06/10/2018  End of Session - 06/10/18 1505    Visit Number  1    Number of Visits  1    Date for SLP Re-Evaluation  06/10/18    SLP Start Time  14    SLP Stop Time   1400    SLP Time Calculation (min)  60 min    Activity Tolerance  Patient tolerated treatment well       Past Medical History:  Diagnosis Date  . Anxiety   . Asthma   . Cancer [facility])    SKIN CANCER  . CHF (congestive heart failure) ([place])   . Depression   . Hypertension   . Thyroid disease     Past Surgical History:  Procedure Laterality Date  . ANKLE SURGERY Right 2012  . APPENDECTOMY    . AUGMENTATION MAMMAPLASTY Bilateral   . BREAST ENHANCEMENT SURGERY    . COLONOSCOPY WITH PROPOFOL N/A 02/22/2015   Procedure: COLONOSCOPY WITH PROPOFOL;  Surgeon: Josefine Class, MD;  Location[facility] ENDOSCOPY;  Service: Endoscopy;  Laterality: N/A;  . ESOPHAGOGASTRODUODENOSCOPY (EGD) WITH PROPOFOL N/A 02/22/2015   Procedure: ESOPHAGOGASTRODUODENOSCOPY (EGD) WITH PROPOFOL;  Surgeon: Josefine Class, MD;  Location: [facility] ENDOSCOPY;  Service: Endoscopy;  Laterality: N/A;  . ESOPHAGOGASTRODUODENOSCOPY (EGD) WITH PROPOFOL N/A 02/11/2016   Procedure: ESOPHAGOGASTRODUODENOSCOPY (EGD) WITH PROPOFOL;  Surgeon: Manya Silvas, MD;  Location: [facility] ENDOSCOPY;  Service: Endoscopy;  Laterality: N/A;  . HEMORRHOID SURGERY    . NASAL SINUS SURGERY    . SAVORY DILATION N/A 02/22/2015   Procedure: SAVORY DILATION;  Surgeon: Josefine Class, MD;  Location: [facility] ENDOSCOPY;  Service: Endoscopy;  Laterality: N/A;  . SAVORY DILATION N/A 02/11/2016   Procedure: SAVORY DILATION;  Surgeon: Manya Silvas, MD;  Location: [facility] ENDOSCOPY;  Service:  Endoscopy;  Laterality: N/A;  . SKIN CANCER EXCISION      There were no vitals filed for this visit.        Subjective: Patient behavior: (alertness, ability to follow instructions, etc.): pt alert; followed commands. She engaged easily in conversation giving details of her medical issues. She has Native dentition. OM exam WFL.  Chief complaint: dysphagia. Pt described GI and Esophageal dysmotility; denied any oropharyngeal phase swallowing issues. Pt has a PMH of GERD, Hiatal Hernia, Gastritis, Reflux Esophagitis, Esophageal dilitation per pt, and other Medical issues per chart notes. Pt stated she feels she has IBS.    Objective:  Radiological Procedure: A videoflouroscopic evaluation of oral-preparatory, reflex initiation, and pharyngeal phases of the swallow was performed; as well as a screening of the upper esophageal phase.  I. POSTURE: upright II. VIEW: lateral III. COMPENSATORY STRATEGIES: None IV. BOLUSES ADMINISTERED:  Thin Liquid: 5 trials  Nectar-thick Liquid: 1 trial  Honey-thick Liquid: NT  Puree: 1 trial (did not like applesauce)  Mechanical Soft: 1 trial V. RESULTS OF EVALUATION: A. ORAL PREPARATORY PHASE: (The lips, tongue, and velum are observed for strength and coordination)       **Overall Severity Rating: WFL. Appropriate oral phase management, timely A-P transfer, and thorough oral clearing w/ all bolus consistencies.   B. SWALLOW INITIATION/REFLEX: (The reflex is normal if "triggered" by the time the bolus reached the base of the tongue)  **  Overall Severity Rating: WFL. Timely pharyngeal swallow initiation at BOT w/ adequate and timely airway closure w/ no laryngeal penetration or aspiration occurring during this study.  C. PHARYNGEAL PHASE: (Pharyngeal function is normal if the bolus shows rapid, smooth, and continuous transit through the pharynx and there is no pharyngeal residue after the swallow)  **Overall Severity Rating: [facility]. No remaining pharyngeal  residue noted post swallowing indicating adequate pharyngeal pressure and laryngeal excursion during the swallow.  D. LARYNGEAL PENETRATION: (Material entering into the laryngeal inlet/vestibule but not aspirated): NONE E. ASPIRATION: NONE F. ESOPHAGEAL PHASE: (Screening of the upper esophagus): outward protrusion from the posterior wall during the swallow - at ~C5. It did not appear to impede bolus flow during trials given today.  ASSESSMENT: Pt appeared to demonstrate No oropharyngeal phase dysphagia during this study today. Of note, during the Esophageal phase, an outward protrusion from the posterior Cervical Esophageal wall was noted at ~C5 during the swallowing. This protrusion did not appear to impede bolus flow of the consistencies given during this study. Though, this may help to explain pt's c/o discomfort she experiences at times. Pt also has Esophageal issues of GERD, Hiatal Hernia, and h/o Reflux Esophagitis that will also impact Esophageal motility, discomfort when swallowing, and pt's perception of her swallowing.  During the Oral phase, appropriate oral phase management, timely A-P transfer, and thorough oral clearing w/ all bolus consistencies. During the Pharyngeal phase, timely pharyngeal swallow initiation at BOT w/ adequate and timely airway closure w/ no laryngeal penetration or aspiration occurring during this study. No remaining pharyngeal residue noted post swallowing indicating adequate pharyngeal pressure and laryngeal excursion during the swallow. Results were shown/dicussed w/ pt post study.  PLAN/RECOMMENDATIONS:  A. Diet: Regular diet w/ Thin liquids; monitor Pill taking/swallowing and take one at a time if indicated, or Whole in a puree such as Applesauce  B. Swallowing Precautions: general aspiration precautions; REFLUX precautions  C. Recommended consultation to: f/u w/ GI for ongoing management of GERD; Esophageal dysmotility  D. Therapy recommendations: None  E.  Results and recommendations were discussed w/ pt; video viewed; questions answered; education completed            Dysphagia, unspecified type - Plan: DG Swallowing Func-Speech Pathology, DG Swallowing Func-Speech Pathology        Problem List Patient Active Problem List   Diagnosis Date Noted  . Multiple lung nodules on CT 04/19/2018  . Borderline diabetes mellitus 08/10/2017  . Paroxysmal supraventricular tachycardia ([place]) 09/27/2015  . Benign essential HTN 06/22/2015  . Combined fat and carbohydrate induced hyperlipemia 06/22/2015  . Breathlessness on exertion 06/17/2015  . Pure hypercholesterolemia 04/22/2015  . Congestive heart failure with left ventricular systolic dysfunction ([place]) 03/24/2015  . Acquired hypothyroidism 02/04/2015  . Heart failure, systolic ([place]) 95/62/1308  . Anxiety 12/28/2014  . Allergic rhinitis 10/27/2014  . Bipolar 1 disorder, depressed ([place]) 10/27/2014  . Hypercholesterolemia without hypertriglyceridemia 10/27/2014  . Depression, major, recurrent, moderate ([place]) 10/27/2014  . Adult hypothyroidism 10/27/2014  . Anxiety, generalized 10/27/2014  . Essential (primary) hypertension 10/27/2014  . Colon polyp 10/27/2014  . Hypoparathyroidism ([place]) 02/14/2013        Orinda Kenner, [place], CCC-SLP Temika Sutphin 06/10/2018, 3:06 PM  [facility] [place], [place], [address] Phone: [phone]   Fax:     Name: Kaylee Gordon MRN: [id number] Date of Birth: 04/14/1961

## 2018-06-11 ENCOUNTER — Ambulatory Visit
Admission: RE | Admit: 2018-06-11 | Discharge: 2018-06-11 | Disposition: A | Discharge status: Home / Self Care | Payer: BC Managed Care – PPO | Source: Ambulatory Visit | Attending: Gastroenterology | Admitting: Gastroenterology

## 2018-06-11 DIAGNOSIS — R9389 Abnormal findings on diagnostic imaging of other specified body structures: Secondary | ICD-10-CM | POA: Insufficient documentation

## 2018-06-11 DIAGNOSIS — R1310 Dysphagia, unspecified: Secondary | ICD-10-CM | POA: Diagnosis not present

## 2018-07-11 ENCOUNTER — Other Ambulatory Visit: Payer: Self-pay | Admitting: Endocrinology

## 2018-08-05 ENCOUNTER — Other Ambulatory Visit: Payer: Self-pay

## 2018-08-05 DIAGNOSIS — R918 Other nonspecific abnormal finding of lung field: Secondary | ICD-10-CM

## 2018-08-05 NOTE — Progress Notes (Signed)
BMET labs entered for CT scan.

## 2018-08-06 ENCOUNTER — Ambulatory Visit: Payer: BC Managed Care – PPO | Admitting: Psychiatry

## 2018-08-07 ENCOUNTER — Other Ambulatory Visit: Payer: Self-pay | Admitting: Endocrinology

## 2018-08-10 ENCOUNTER — Other Ambulatory Visit: Payer: Self-pay | Admitting: Endocrinology

## 2018-08-12 ENCOUNTER — Other Ambulatory Visit: Payer: Self-pay

## 2018-08-12 ENCOUNTER — Telehealth: Payer: Self-pay | Admitting: Endocrinology

## 2018-08-12 MED ORDER — LEVOTHYROXINE SODIUM 50 MCG PO TABS: 75.0000 ug | ORAL_TABLET | Freq: Every day | ORAL | 3 refills | Status: DC

## 2018-08-12 NOTE — Telephone Encounter (Signed)
Rx sent 

## 2018-08-12 NOTE — Telephone Encounter (Signed)
MEDICATION: levothyroxine (SYNTHROID, LEVOTHROID) 50 MCG tablet  PHARMACY:  [facility] in [place] : Not sure  IS PATIENT OUT OF MEDICATION: No  IF NOT; HOW MUCH IS LEFT: Does not know  LAST APPOINTMENT DATE: @1 /25/2020  NEXT APPOINTMENT DATE:@3 /23/2020  DO WE HAVE YOUR PERMISSION TO LEAVE A DETAILED MESSAGE:Yes  OTHER COMMENTS: Auto refill did not work for the above [employer]   **Let patient know to contact pharmacy at the end of the day to make sure medication is ready. **  ** Please notify patient to allow 48-72 hours to process**  **Encourage patient to contact the pharmacy for refills or they can request refills through [facility]**

## 2018-08-13 ENCOUNTER — Ambulatory Visit: Payer: BC Managed Care – PPO | Admitting: Psychiatry

## 2018-08-19 ENCOUNTER — Other Ambulatory Visit: Payer: BC Managed Care – PPO

## 2018-08-20 ENCOUNTER — Ambulatory Visit
Admission: RE | Admit: 2018-08-20 | Discharge: 2018-08-20 | Disposition: A | Discharge status: Home / Self Care | Payer: BC Managed Care – PPO | Source: Ambulatory Visit | Attending: Pulmonary Disease | Admitting: Pulmonary Disease

## 2018-08-20 DIAGNOSIS — R918 Other nonspecific abnormal finding of lung field: Secondary | ICD-10-CM

## 2018-08-20 MED ORDER — IOPAMIDOL (ISOVUE-300) INJECTION 61%
100.0000 mL | Freq: Once | INTRAVENOUS | Status: AC | PRN
  Administered 2018-08-20: 100 mL via INTRAVENOUS

## 2018-08-22 ENCOUNTER — Encounter: Payer: Self-pay | Admitting: Pulmonary Disease

## 2018-08-22 ENCOUNTER — Ambulatory Visit: Payer: BC Managed Care – PPO | Admitting: Pulmonary Disease

## 2018-08-22 VITALS — BP 122/82 | HR 87 | Ht 65.0 in | Wt 181.6 lb

## 2018-08-22 DIAGNOSIS — J383 Other diseases of vocal cords: Secondary | ICD-10-CM | POA: Diagnosis not present

## 2018-08-22 DIAGNOSIS — R918 Other nonspecific abnormal finding of lung field: Secondary | ICD-10-CM | POA: Diagnosis not present

## 2018-08-22 DIAGNOSIS — K219 Gastro-esophageal reflux disease without esophagitis: Secondary | ICD-10-CM | POA: Diagnosis not present

## 2018-08-22 DIAGNOSIS — F419 Anxiety disorder, unspecified: Secondary | ICD-10-CM

## 2018-08-22 DIAGNOSIS — R0602 Shortness of breath: Secondary | ICD-10-CM

## 2018-08-22 DIAGNOSIS — Z85820 Personal history of malignant melanoma of skin: Secondary | ICD-10-CM

## 2018-08-22 DIAGNOSIS — K449 Diaphragmatic hernia without obstruction or gangrene: Secondary | ICD-10-CM

## 2018-08-22 DIAGNOSIS — F329 Major depressive disorder, single episode, unspecified: Secondary | ICD-10-CM

## 2018-08-22 MED ORDER — ALBUTEROL SULFATE HFA 108 (90 BASE) MCG/ACT IN AERS: 2.0000 | INHALATION_SPRAY | Freq: Four times a day (QID) | RESPIRATORY_TRACT | 2 refills | Status: DC | PRN

## 2018-08-22 MED ORDER — PANTOPRAZOLE SODIUM 40 MG PO TBEC: 40.0000 mg | DELAYED_RELEASE_TABLET | Freq: Two times a day (BID) | ORAL | 2 refills | Status: DC

## 2018-08-22 NOTE — Progress Notes (Signed)
Subjective:    Patient ID: Kaylee Gordon, female    DOB: October 19, 1960, 58 y.o.   MRN: [id number]  HPI The patient is a 58 year old lifelong never smoker, who presents for follow-up of multiple lung nodules.  She had percutaneous biopsy of an indeterminate 1.5 cm nodule on the right lower lobe. As was done on October 31. The patient has a history of excision of melanoma in 1983. Pathology of the biopsy shows that the findings are consistent with inflammation, there are foamy macrophages that sometimes can be seen with chronic silent aspiration. This fits the patient's history as she has issues with gastroesophageal reflux.  She does have a moderate sized hiatal hernia. Of note chronic silent aspiration can lead to issues with bronchiolitis and evanescent inflammatory lung nodules.  The patient had CT scan of the chest that shows the multiple nodules that are stable in size with the exception of the moderate nodule that was biopsied.  This shows that the solid portion is the same size however surrounded by inflammatory change.  This is likely related to infection/inflammation.  Have reviewed this film independently.  The patient does not appear to be following strict antireflux measures and does not appear to be taking her Protonix.  She seems confused with her medications.  She presents today very anxious and stating that she has been more short of breath.  She states that stress brings on the ornis of breath.  In addition dusting will also trigger it as well as strong odors.  She has used albuterol on occasion when these episodes occur and she is not certain whether this helps or not but she insists that she wants to keep an inhaler handy.  Prior PFTs have not shown obstructive lung disease.  She has had evidence of paradoxical vocal cord motion by flow-volume loop.  She has not had any orthopnea or paroxysmal nocturnal dyspnea.  She has not had lower extremity edema.  She is exceedingly anxious during  her visit today and when asked during a review of systems most of the time she answers "I do not know".  Seems distracted and unable to focus.  She states that diet he is actually hampering her activities of daily living.    Review of Systems  Constitutional: Positive for activity change (Feels anxiety limits).  HENT: Negative.   Eyes: Negative.   Respiratory: Positive for chest tightness and shortness of breath.   Cardiovascular: Positive for palpitations.  Gastrointestinal:       Persistent reflux symptoms.  Endocrine: Negative.   Genitourinary: Negative.   Musculoskeletal: Negative.   Skin: Negative.   Allergic/Immunologic: Negative.   Neurological: Negative.   Hematological: Negative.   Psychiatric/Behavioral: Positive for decreased concentration. The patient is nervous/anxious.   All other systems reviewed and are negative.      Objective:   Physical Exam Constitutional: She is oriented to person, place, and time. She appears well-developed.  Non-toxic appearance. She does not appear ill.  Overweight  HENT:  Head: Normocephalic and atraumatic.  Eyes: Pupils are equal, round, and reactive to light. No scleral icterus.  Neck: Neck supple.  Cardiovascular: Normal rate, regular rhythm and normal heart sounds.  Pulmonary/Chest: Effort normal and breath sounds normal.  Abdominal: Soft. She exhibits no distension.  Musculoskeletal: Normal range of motion. She exhibits no edema.  Neurological: She is alert and oriented to person, place, and time.  No focal deficits  Skin: Skin is warm and dry.  Psychiatric: Her speech is somewhat  pressured. Her mood appears anxious.  Nursing note and vitals reviewed.  Spirometry was performed today.  The patient had difficulty performing the maneuvers however there is a double hump noted on expiratory flow volume loop consistent with vocal cord paradoxical motion.  There is no evidence of obstruction.  Assessment & Plan:  1.  Shortness of  breath: She has had normal echocardiogram previously on 07 May 2018.  She had normal PFTs in October 2019.  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  Will refer to voice disorder center.  We will address gastroesophageal reflux aggressively.  She will receive a as needed albuterol however I have told her that I am skeptical that this will help her.   2.  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 The patient has issues with persistent gastroesophageal reflux symptoms, hiatal hernia; she has frank reflux particularly at night. The best management for this is to tend to her G.I. issues and this usually tends to control DAB. In any event, we will follow her lung nodules expectantly. She will have a CT scan of the chest repeated and another 3 months time.  We will treat her gastroesophageal reflux aggressively until seen in follow-up.  3.  Hiatal hernia gastroesophageal reflux: She will be treated with Protonix twice a day for 4 to 6 weeks time and continue Protonix at least once daily until seen in follow-up.  4.  Anxiety and depression: Patient seems to be plagued with severe debilitating anxiety and depression.  She admits that this is limiting her activities of daily living and she feels "paralyzed" by her anxiety.  She is currently being followed by psychiatry.  I have urged her to continue close follow-up with psychiatry.  She does not endorse suicidal or homicidal ideation.  5. History of malignant melanoma: this issue adds complexity to her management.

## 2018-08-22 NOTE — Patient Instructions (Addendum)
1.  We will repeat your CT scan in 3 months time.  You still have one area that appears to be consistent with inflammation as shown by the prior biopsy.  2.  Your shortness of breath appears to be related to vocal cord dysfunction this issue can be aggravated by stress, anxiety and gastroesophageal reflux.  We will make a referral to the voice disorder center to evaluate this issue.  3.  A prescription for albuterol was sent to your pharmacy this is a refill of your prior prescription.  4.  We will increase your Protonix to twice a day for a month's time and then cut back to once a day.  This simplification for reflux.

## 2018-08-26 ENCOUNTER — Ambulatory Visit: Payer: BC Managed Care – PPO | Admitting: Psychiatry

## 2018-09-02 ENCOUNTER — Telehealth: Payer: Self-pay | Admitting: Psychiatry

## 2018-09-02 ENCOUNTER — Telehealth: Payer: Self-pay

## 2018-09-02 DIAGNOSIS — F321 Major depressive disorder, single episode, moderate: Secondary | ICD-10-CM

## 2018-09-02 MED ORDER — DULOXETINE HCL 30 MG PO CPEP: 60.0000 mg | ORAL_CAPSULE | Freq: Every day | ORAL | 0 refills | Status: DC

## 2018-09-02 NOTE — Telephone Encounter (Signed)
pt called left message that she has an appt for next week but she is completly out of cymbalta, she needs enought medications to get to her next appt.

## 2018-09-02 NOTE — Telephone Encounter (Signed)
Will sent Cymbalta to pharmacy.

## 2018-09-03 ENCOUNTER — Other Ambulatory Visit: Payer: Self-pay | Admitting: Endocrinology

## 2018-09-11 ENCOUNTER — Encounter: Payer: Self-pay | Admitting: Psychiatry

## 2018-09-11 ENCOUNTER — Other Ambulatory Visit: Payer: Self-pay

## 2018-09-11 ENCOUNTER — Ambulatory Visit: Payer: BC Managed Care – PPO | Admitting: Psychiatry

## 2018-09-11 VITALS — BP 136/80 | HR 85 | Temp 99.3°F | Wt 180.6 lb

## 2018-09-11 DIAGNOSIS — F3130 Bipolar disorder, current episode depressed, mild or moderate severity, unspecified: Secondary | ICD-10-CM

## 2018-09-11 DIAGNOSIS — F411 Generalized anxiety disorder: Secondary | ICD-10-CM

## 2018-09-11 MED ORDER — QUETIAPINE FUMARATE 100 MG PO TABS: ORAL_TABLET | ORAL | 2 refills | Status: DC

## 2018-09-11 MED ORDER — HYDROXYZINE HCL 25 MG PO TABS: 12.5000 mg | ORAL_TABLET | Freq: Three times a day (TID) | ORAL | 0 refills | Status: DC | PRN

## 2018-09-11 NOTE — Progress Notes (Signed)
[facility] OP Progress Note  09/11/2018 3:18 PM EAVAN GONTERMAN  MRN:  [id number]  Chief Complaint: ' I am here for follow up.' Chief Complaint    Follow-up; Fatigue     HPI: Kaylee Gordon is a 58 yr old Caucasian female who has a history of mood lability and anxiety, married, retired, lives in [place], presented to clinic today for a follow-up visit.  Patient used to follow-up with Dr. Einar Grad here in clinic.  I have reviewed medical records per Dr. Abran Duke notes dated 09/21/2015-06/06/2018.  Per progress note on 06/06/2018-' patient continues to feel tired.  Patient also struggles with shopping.  Patient provided supportive therapy.  Patient continued on her medications like Cymbalta and Seroquel.'   Patient today returns for an appointment.  Patient reports she has been having some worsening anxiety symptoms.  She reports there are times when she is anxious, irritable.  She also reports sleep problems.  Patient reports she takes Cymbalta and Seroquel as prescribed.  There are times when she takes a higher dosage of Seroquel to help her sleep better.  She however reports she does not want to stay on that dosage since she sleeps too long and feels groggy when she wakes up in the morning.  She has tried medications like trazodone previously however she had a fall and had fracture when she was on that medication.  Patient continues to struggle with fibromyalgia.  She reports that it affects her daily functioning.  She feels tired and lethargic when she has a flareup.  She continues to work with her primary medical doctor on the same.  She reports Cymbalta helps to some extent.  Patient wonders whether she has a diagnosis of bipolar depression or unipolar depression.  Patient was advised to fill out a mood disorder questionnaire.  Based on clinical evaluation and mood disorder questionnaire it is likely that she has bipolar disorder and this was discussed with patient.  Also reviewed Dr. Josefa Half notes as  summarized above.    Visit Diagnosis:    ICD-10-CM   1. Bipolar I disorder, most recent episode depressed (HCC) F31.30 QUEtiapine (SEROQUEL) 100 MG tablet  2. GAD (generalized anxiety disorder) F41.1 hydrOXYzine (ATARAX/VISTARIL) 25 MG tablet    Past Psychiatric History: Patient follows up with Dr. Einar Grad here in clinic.  Past Medical History:  Past Medical History:  Diagnosis Date  . Anxiety   . Asthma   . Cancer [facility])    SKIN CANCER  . CHF (congestive heart failure) ([place])   . Depression   . Hypertension   . Thyroid disease     Past Surgical History:  Procedure Laterality Date  . ANKLE SURGERY Right 2012  . APPENDECTOMY    . AUGMENTATION MAMMAPLASTY Bilateral   . BREAST ENHANCEMENT SURGERY    . COLONOSCOPY WITH PROPOFOL N/A 02/22/2015   Procedure: COLONOSCOPY WITH PROPOFOL;  Surgeon: Josefine Class, MD;  Location: [facility] ENDOSCOPY;  Service: Endoscopy;  Laterality: N/A;  . ESOPHAGOGASTRODUODENOSCOPY (EGD) WITH PROPOFOL N/A 02/22/2015   Procedure: ESOPHAGOGASTRODUODENOSCOPY (EGD) WITH PROPOFOL;  Surgeon: Josefine Class, MD;  Location: [facility] ENDOSCOPY;  Service: Endoscopy;  Laterality: N/A;  . ESOPHAGOGASTRODUODENOSCOPY (EGD) WITH PROPOFOL N/A 02/11/2016   Procedure: ESOPHAGOGASTRODUODENOSCOPY (EGD) WITH PROPOFOL;  Surgeon: Manya Silvas, MD;  Location: [facility] ENDOSCOPY;  Service: Endoscopy;  Laterality: N/A;  . HEMORRHOID SURGERY    . NASAL SINUS SURGERY    . SAVORY DILATION N/A 02/22/2015   Procedure: SAVORY DILATION;  Surgeon: Josefine Class, MD;  Location: [facility] ENDOSCOPY;  Service: Endoscopy;  Laterality: N/A;  . SAVORY DILATION N/A 02/11/2016   Procedure: SAVORY DILATION;  Surgeon: Manya Silvas, MD;  Location: [facility] ENDOSCOPY;  Service: Endoscopy;  Laterality: N/A;  . SKIN CANCER EXCISION      Family Psychiatric History: As noted below Family History:  Family History  Problem Relation Age of Onset  . Cancer Mother   . Depression Mother   . Anxiety disorder  Mother   . Heart disease Father   . Cancer Father   . Anxiety disorder Father   . Depression Father   . Hypoparathyroidism Son     Social History: Patient is married.  Patient is retired.  Patient lives in [place].  Patient has a son and a daughter. Social History   Socioeconomic History  . Marital status: Married    Spouse name: Legrand Como  . Number of children: Not on file  . Years of education: Not on file  . Highest education level: Not on file  Occupational History  . Not on file  Social Needs  . Financial resource strain: Not hard at all  . Food insecurity:    Worry: Never true    Inability: Never true  . Transportation needs:    Medical: No    Non-medical: No  Tobacco Use  . Smoking status: Never Smoker  . Smokeless tobacco: Never Used  Substance and Sexual Activity  . Alcohol use: No    Alcohol/week: 0.0 standard drinks    Comment: MAYBE ONCE OR TWICE A MONTH  . Drug use: No  . Sexual activity: Not Currently    Partners: Male  Lifestyle  . Physical activity:    Days per week: 0 days    Minutes per session: Not on file  . Stress: Very much  Relationships  . Social connections:    Talks on phone: Twice a week    Gets together: Twice a week    Attends religious service: More than 4 times per year    Active member of club or organization: Not on file    Attends meetings of clubs or organizations: Not on file    Relationship status: Married  Other Topics Concern  . Not on file  Social History Narrative  . Not on file    Allergies:  Allergies  Allergen Reactions  . Atorvastatin   . Parafon Forte Dsc [Chlorzoxazone]   . Pollen Extract   . Sudafed [Pseudoephedrine Hcl]     Heart racing    Metabolic Disorder Labs: No results found for: HGBA1C, MPG Lab Results  Component Value Date   PROLACTIN 18.3 07/22/2014   No results found for: CHOL, TRIG, HDL, CHOLHDL, VLDL, LDLCALC Lab Results  Component Value Date   TSH 1.68 04/03/2018   TSH 3.64  10/29/2017    Therapeutic Level Labs: No results found for: LITHIUM No results found for: VALPROATE No components found for:  CBMZ  Current Medications: Current Outpatient Medications  Medication Sig Dispense Refill  . albuterol (PROVENTIL HFA;VENTOLIN HFA) 108 (90 Base) MCG/ACT inhaler Inhale 2 puffs into the lungs every 6 (six) hours as needed for shortness of breath. 1 Inhaler 2  . calcitRIOL (ROCALTROL) 0.25 MCG capsule TAKE ONE CAPSULE BY MOUTH DAILY 30 capsule 0  . chlorhexidine (PERIDEX) 0.12 % solution     . DULoxetine (CYMBALTA) 30 MG capsule Take 2 capsules (60 mg total) by mouth daily. 60 capsule 0  . fluticasone (FLONASE) 50 MCG/ACT nasal spray     .  hydrOXYzine (ATARAX/VISTARIL) 25 MG tablet Take 0.5-1 tablets (12.5-25 mg total) by mouth 3 (three) times daily as needed for anxiety. For severe anxiety and sleep 90 tablet 0  . hyoscyamine (LEVSIN, ANASPAZ) 0.125 MG tablet Take by mouth.    Marland Kitchen ibuprofen (ADVIL,MOTRIN) 400 MG tablet     . levothyroxine (SYNTHROID, LEVOTHROID) 50 MCG tablet Take 1.5 tablets (75 mcg total) by mouth daily. 45 tablet 3  . lisinopril (PRINIVIL,ZESTRIL) 5 MG tablet TAKE 1 TABLET BY MOUTH ONCE A DAY    . lovastatin (MEVACOR) 20 MG tablet Take 20 mg by mouth at bedtime.    . pantoprazole (PROTONIX) 40 MG tablet Take 1 tablet (40 mg total) by mouth 2 (two) times daily before a meal. 60 tablet 2  . propranolol (INDERAL) 20 MG tablet Take 20 mg by mouth 2 (two) times daily.     . QUEtiapine (SEROQUEL) 100 MG tablet TAKE 1 TABLETS BY MOUTH EVERY NIGHT AT BEDTIME 30 tablet 2  . gabapentin (NEURONTIN) 100 MG capsule Take 1 capsule (100 mg total) by mouth 2 (two) times daily. (Patient taking differently: Take 100 mg by mouth 3 (three) times daily. ) 60 capsule 2   No current facility-administered medications for this visit.      Musculoskeletal: Strength & Muscle Tone: within normal limits Gait & Station: normal Patient leans: N/A  Psychiatric Specialty  Exam: Review of Systems  Constitutional: Positive for malaise/fatigue.  Psychiatric/Behavioral: The patient is nervous/anxious and has insomnia.   All other systems reviewed and are negative.   Blood pressure 136/80, pulse 85, temperature 99.3 F (37.4 C), temperature source Oral, weight 180 lb 9.6 oz (81.9 kg).Body mass index is 30.05 kg/m.  General Appearance: Casual  Eye Contact:  Fair  Speech:  Clear and Coherent  Volume:  Normal  Mood:  Anxious  Affect:  Appropriate  Thought Process:  Goal Directed and Descriptions of Associations: Intact  Orientation:  Full (Time, Place, and Person)  Thought Content: Logical   Suicidal Thoughts:  No  Homicidal Thoughts:  No  Memory:  Immediate;   Fair Recent;   Fair Remote;   Fair  Judgement:  Fair  Insight:  Fair  Psychomotor Activity:  Normal  Concentration:  Concentration: Fair and Attention Span: Fair  Recall:  [employer] of Knowledge: Fair  Language: Fair  Akathisia:  No  Handed:  Right  AIMS (if indicated): denies tremors, rigidity,stiffness  Assets:  Communication Skills Desire for Improvement Social Support  ADL's:  Intact  Cognition: WNL  Sleep:  restless   Screenings:   Assessment and Plan: Kaylee Gordon is a 58 year old Caucasian female, married, retired, lives in [place], has a history of bipolar disorder, anxiety disorder, fibromyalgia, IBS, presented to clinic today for a follow-up visit.  Patient today reports continued mood lability, anxiety symptoms and sleep problems.  Patient completed a mood disorder questionnaire and scored - question #1-11, question #2-yes, question #3-serious problem.  Patient will benefit from medication readjustment.  Plan as noted below.  Plan Bipolar disorder- unstable Continue Cymbalta 60 mg p.o. daily Continue Seroquel 100 mg at bedtime.  Patient reports she takes 150 at bedtime some days. Add hydroxyzine 12.5 to 25 mg p.o. 3 times daily as needed  For generalized anxiety  disorder-improving Continue CBT  Patient will continue psychotherapy sessions with Ms. Miguel Dibble.  I have reviewed medical records in [facility] per Dr. Einar Grad most recent one  dated 06/06/2018 as summarized above.  Follow-up in clinic with Dr. Einar Grad in  a month.  I have spent atleast 25 minutes face to face with patient today. More than 50 % of the time was spent for psychoeducation and supportive psychotherapy and care coordination.  This note was generated in part or whole with voice recognition software. Voice recognition is usually quite accurate but there are transcription errors that can and very often do occur. I apologize for any typographical errors that were not detected and corrected.       Ursula Alert, MD 09/11/2018, 3:18 PM

## 2018-09-30 ENCOUNTER — Other Ambulatory Visit: Payer: Self-pay | Admitting: Psychiatry

## 2018-09-30 ENCOUNTER — Other Ambulatory Visit: Payer: Self-pay | Admitting: Endocrinology

## 2018-09-30 DIAGNOSIS — F321 Major depressive disorder, single episode, moderate: Secondary | ICD-10-CM

## 2018-10-07 ENCOUNTER — Ambulatory Visit: Payer: BC Managed Care – PPO | Admitting: Endocrinology

## 2018-10-17 ENCOUNTER — Other Ambulatory Visit: Payer: Self-pay

## 2018-10-17 ENCOUNTER — Encounter: Payer: Self-pay | Admitting: Psychiatry

## 2018-10-17 ENCOUNTER — Ambulatory Visit (INDEPENDENT_AMBULATORY_CARE_PROVIDER_SITE_OTHER): Payer: BC Managed Care – PPO | Admitting: Psychiatry

## 2018-10-17 DIAGNOSIS — F3130 Bipolar disorder, current episode depressed, mild or moderate severity, unspecified: Secondary | ICD-10-CM

## 2018-10-17 DIAGNOSIS — F411 Generalized anxiety disorder: Secondary | ICD-10-CM | POA: Diagnosis not present

## 2018-10-17 NOTE — Progress Notes (Signed)
TC 10-17-18 pt medical and surgical hx was reviewed with no changes, pt allergies was reviewed with no changes, pt mediations and pharmacy was reviewed and updated. Pt vitals was not taken due to this was a phone consult.  Pt did state that she is sick and that pcp put her on mediations.

## 2018-10-17 NOTE — Progress Notes (Signed)
[facility] MD OP Progress Note  10/17/2018 2:35 PM Kaylee Gordon  MRN:  [id number]  Chief Complaint: sick with viral infection, depressed Chief Complaint    Follow-up     HPI: Kaylee Gordon is a 58 yr old Caucasian female who has a history of mood lability and anxiety, married, retired, lives in [place], phone visit conducted due to current covid 19 situation.  Patient reports not feeling well. She has been diagnosed with a viral illness with cough, fatigue and malaise. Her PCP did not rule out corona virus. States her adult children are home due to the covid 19 situation. used to follow-up with Dr. Einar Grad here in clinic.  I have reviewed medical records per Dr. Abran Duke notes dated 09/21/2015-06/06/2018.  Per progress note on 06/06/2018-' patient continues to feel tired.  Patient also struggles with shopping.  Patient provided supportive therapy.  Patient continued on her medications like Cymbalta and Seroquel.'   Patient today returns for an appointment.  Patient reports she has been having some worsening anxiety symptoms.  She reports there are times when she is anxious, irritable.  She also reports sleep problems.  Patient reports she takes Cymbalta and Seroquel as prescribed.  There are times when she takes a higher dosage of Seroquel to help her sleep better.  She however reports she does not want to stay on that dosage since she sleeps too long and feels groggy when she wakes up in the morning.  She has tried medications like trazodone previously however she had a fall and had fracture when she was on that medication.  Patient continues to struggle with fibromyalgia.  She reports that it affects her daily functioning.  She feels tired and lethargic when she has a flareup.  She continues to work with her primary medical doctor on the same.  She reports Cymbalta helps to some extent.  Patient wonders whether she has a diagnosis of bipolar depression or unipolar depression.  Patient was advised to  fill out a mood disorder questionnaire.  Based on clinical evaluation and mood disorder questionnaire it is likely that she has bipolar disorder and this was discussed with patient.  Also reviewed Dr. Josefa Half notes as summarized above.    Visit Diagnosis:    ICD-10-CM   1. Bipolar I disorder, most recent episode depressed ([place]) F31.30   2. GAD (generalized anxiety disorder) F41.1     Past Psychiatric History: Patient follows up with Dr. Einar Grad here in clinic.  Past Medical History:  Past Medical History:  Diagnosis Date  . Anxiety   . Asthma   . Cancer [facility])    SKIN CANCER  . CHF (congestive heart failure) ([place])   . Depression   . Hypertension   . Thyroid disease     Past Surgical History:  Procedure Laterality Date  . ANKLE SURGERY Right 2012  . APPENDECTOMY    . AUGMENTATION MAMMAPLASTY Bilateral   . BREAST ENHANCEMENT SURGERY    . COLONOSCOPY WITH PROPOFOL N/A 02/22/2015   Procedure: COLONOSCOPY WITH PROPOFOL;  Surgeon: Josefine Class, MD;  Location[facility] ENDOSCOPY;  Service: Endoscopy;  Laterality: N/A;  . ESOPHAGOGASTRODUODENOSCOPY (EGD) WITH PROPOFOL N/A 02/22/2015   Procedure: ESOPHAGOGASTRODUODENOSCOPY (EGD) WITH PROPOFOL;  Surgeon: Josefine Class, MD;  Location: [facility] ENDOSCOPY;  Service: Endoscopy;  Laterality: N/A;  . ESOPHAGOGASTRODUODENOSCOPY (EGD) WITH PROPOFOL N/A 02/11/2016   Procedure: ESOPHAGOGASTRODUODENOSCOPY (EGD) WITH PROPOFOL;  Surgeon: Manya Silvas, MD;  Location: [facility] ENDOSCOPY;  Service: Endoscopy;  Laterality: N/A;  . HEMORRHOID SURGERY    .  NASAL SINUS SURGERY    . SAVORY DILATION N/A 02/22/2015   Procedure: SAVORY DILATION;  Surgeon: Josefine Class, MD;  Location: [facility] ENDOSCOPY;  Service: Endoscopy;  Laterality: N/A;  . SAVORY DILATION N/A 02/11/2016   Procedure: SAVORY DILATION;  Surgeon: Manya Silvas, MD;  Location: [facility] ENDOSCOPY;  Service: Endoscopy;  Laterality: N/A;  . SKIN CANCER EXCISION      Family Psychiatric History: As  noted below Family History:  Family History  Problem Relation Age of Onset  . Cancer Mother   . Depression Mother   . Anxiety disorder Mother   . Heart disease Father   . Cancer Father   . Anxiety disorder Father   . Depression Father   . Hypoparathyroidism Son     Social History: Patient is married.  Patient is retired.  Patient lives in [place].  Patient has a son and a daughter. Social History   Socioeconomic History  . Marital status: Married    Spouse name: Legrand Como  . Number of children: Not on file  . Years of education: Not on file  . Highest education level: Not on file  Occupational History  . Not on file  Social Needs  . Financial resource strain: Not hard at all  . Food insecurity:    Worry: Never true    Inability: Never true  . Transportation needs:    Medical: No    Non-medical: No  Tobacco Use  . Smoking status: Never Smoker  . Smokeless tobacco: Never Used  Substance and Sexual Activity  . Alcohol use: No    Alcohol/week: 0.0 standard drinks    Comment: MAYBE ONCE OR TWICE A MONTH  . Drug use: No  . Sexual activity: Not Currently    Partners: Male  Lifestyle  . Physical activity:    Days per week: 0 days    Minutes per session: Not on file  . Stress: Very much  Relationships  . Social connections:    Talks on phone: Twice a week    Gets together: Twice a week    Attends religious service: More than 4 times per year    Active member of club or organization: Not on file    Attends meetings of clubs or organizations: Not on file    Relationship status: Married  Other Topics Concern  . Not on file  Social History Narrative  . Not on file    Allergies:  Allergies  Allergen Reactions  . Atorvastatin   . Parafon Forte Dsc [Chlorzoxazone]   . Pollen Extract   . Sudafed [Pseudoephedrine Hcl]     Heart racing    Metabolic Disorder Labs: No results found for: HGBA1C, MPG Lab Results  Component Value Date   PROLACTIN 18.3 07/22/2014    No results found for: CHOL, TRIG, HDL, CHOLHDL, VLDL, LDLCALC Lab Results  Component Value Date   TSH 1.68 04/03/2018   TSH 3.64 10/29/2017    Therapeutic Level Labs: No results found for: LITHIUM No results found for: VALPROATE No components found for:  CBMZ  Current Medications: Current Outpatient Medications  Medication Sig Dispense Refill  . amoxicillin (AMOXIL) 875 MG tablet Take by mouth.    Marland Kitchen azelastine (ASTELIN) 0.1 % nasal spray Place into the nose.    . calcitRIOL (ROCALTROL) 0.25 MCG capsule TAKE ONE CAPSULE BY MOUTH DAILY 30 capsule 0  . DULoxetine (CYMBALTA) 30 MG capsule TAKE TWO CAPSULES BY MOUTH DAILY 60 capsule 0  . fluticasone (FLONASE) 50 MCG/ACT  nasal spray     . gabapentin (NEURONTIN) 300 MG capsule TAKE TWO CAPSULES BY MOUTH EVERY MORNING; THEN ONE CAPSULE BY MOUTH MIDDAY; THEN TAKE TWO CAPSULES BY MOUTH EVERY EVENING    . guaiFENesin-codeine 100-10 MG/5ML syrup Take by mouth.    . hydrOXYzine (ATARAX/VISTARIL) 25 MG tablet Take 0.5-1 tablets (12.5-25 mg total) by mouth 3 (three) times daily as needed for anxiety. For severe anxiety and sleep 90 tablet 0  . hyoscyamine (LEVSIN, ANASPAZ) 0.125 MG tablet Take by mouth.    Marland Kitchen ibuprofen (ADVIL,MOTRIN) 400 MG tablet     . levothyroxine (SYNTHROID, LEVOTHROID) 50 MCG tablet Take 1.5 tablets (75 mcg total) by mouth daily. 45 tablet 3  . lisinopril (PRINIVIL,ZESTRIL) 5 MG tablet TAKE 1 TABLET BY MOUTH ONCE A DAY    . lovastatin (MEVACOR) 20 MG tablet Take 20 mg by mouth at bedtime.    . pantoprazole (PROTONIX) 40 MG tablet Take 1 tablet (40 mg total) by mouth 2 (two) times daily before a meal. 60 tablet 2  . propranolol (INDERAL) 20 MG tablet Take 20 mg by mouth 2 (two) times daily.     . QUEtiapine (SEROQUEL) 100 MG tablet TAKE 1 TABLETS BY MOUTH EVERY NIGHT AT BEDTIME 30 tablet 2   No current facility-administered medications for this visit.      Musculoskeletal: Strength & Muscle Tone: within normal  limits Gait & Station: normal Patient leans: N/A  Psychiatric Specialty Exam: Review of Systems  Constitutional: Positive for malaise/fatigue.  Psychiatric/Behavioral: The patient is nervous/anxious and has insomnia.   All other systems reviewed and are negative.   There were no vitals taken for this visit.There is no height or weight on file to calculate BMI.  General Appearance: Casual  Eye Contact:  Fair  Speech:  Clear and Coherent  Volume:  Normal  Mood:  depressed  Affect:  Appropriate  Thought Process:  Goal Directed and Descriptions of Associations: Intact  Orientation:  Full (Time, Place, and Person)  Thought Content: Logical   Suicidal Thoughts:  No  Homicidal Thoughts:  No  Memory:  Immediate;   Fair Recent;   Fair Remote;   Fair  Judgement:  Fair  Insight:  Fair  Psychomotor Activity:  Normal  Concentration:  Concentration: Fair and Attention Span: Fair  Recall:  [employer] of Knowledge: Fair  Language: Fair  Akathisia:  No  Handed:  Right  AIMS (if indicated): denies tremors, rigidity,stiffness  Assets:  Communication Skills Desire for Improvement Social Support  ADL's:  Intact  Cognition: WNL  Sleep:  restless   Screenings:   Assessment and Plan: Saltos is a 58 year old Caucasian female, married, retired, lives in [place], has a history of bipolar disorder, anxiety disorder, fibromyalgia, IBS, presented to clinic today for a follow-up visit.  Patient today reports continued mood lability, anxiety symptoms and sleep problems.  Patient completed a mood disorder questionnaire and scored - question #1-11, question #2-yes, question #3-serious problem.  Patient will benefit from medication readjustment.  Plan as noted below.  Plan Bipolar disorder- unstable Continue Cymbalta 60 mg p.o. daily Continue Seroquel 100 mg at bedtime.  Patient reports she takes 150 at bedtime some days. Continue  hydroxyzine 12.5 to 25 mg p.o. 3 times daily as needed  For  generalized anxiety disorder-improving Continue CBT  Patient will continue psychotherapy sessions with Ms. Miguel Dibble.  Follow up consult in 2  Weeks time.        Elvin So, MD 10/17/2018, 2:35 PM

## 2018-10-26 ENCOUNTER — Other Ambulatory Visit: Payer: Self-pay | Admitting: Endocrinology

## 2018-10-28 ENCOUNTER — Encounter: Payer: Self-pay | Admitting: Psychiatry

## 2018-11-05 ENCOUNTER — Ambulatory Visit: Payer: BC Managed Care – PPO | Admitting: Psychiatry

## 2018-11-13 ENCOUNTER — Encounter: Payer: Self-pay | Admitting: Psychiatry

## 2018-11-13 ENCOUNTER — Ambulatory Visit (INDEPENDENT_AMBULATORY_CARE_PROVIDER_SITE_OTHER): Payer: BC Managed Care – PPO | Admitting: Psychiatry

## 2018-11-13 ENCOUNTER — Other Ambulatory Visit: Payer: Self-pay

## 2018-11-13 DIAGNOSIS — F3130 Bipolar disorder, current episode depressed, mild or moderate severity, unspecified: Secondary | ICD-10-CM

## 2018-11-13 DIAGNOSIS — F411 Generalized anxiety disorder: Secondary | ICD-10-CM

## 2018-11-13 MED ORDER — DULOXETINE HCL 30 MG PO CPEP: 60.0000 mg | ORAL_CAPSULE | Freq: Every day | ORAL | 0 refills | Status: DC

## 2018-11-13 MED ORDER — HYDROXYZINE HCL 25 MG PO TABS: 12.5000 mg | ORAL_TABLET | Freq: Three times a day (TID) | ORAL | 0 refills | Status: DC | PRN

## 2018-11-13 MED ORDER — QUETIAPINE FUMARATE 200 MG PO TABS: 200.0000 mg | ORAL_TABLET | Freq: Every day | ORAL | 1 refills | Status: DC

## 2018-11-13 NOTE — Progress Notes (Signed)
Virtual Visit via Video Note  I connected with Kaylee Gordon on 11/13/18 at  1:15 PM EDT by a video enabled telemedicine application and verified that I am speaking with the correct person using two identifiers.   I discussed the limitations of evaluation and management by telemedicine and the availability of in person appointments. The patient expressed understanding and agreed to proceed.    I discussed the assessment and treatment plan with the patient. The patient was provided an opportunity to ask questions and all were answered. The patient agreed with the plan and demonstrated an understanding of the instructions.   The patient was advised to call back or seek an in-person evaluation if the symptoms worsen or if the condition fails to improve as anticipated.   [facility] MD OP Progress Note  11/13/2018 5:25 PM [name]  MRN:  [id number]  Chief Complaint:  Chief Complaint    Follow-up     HPI: Kaylee Gordon is a 58 year old Caucasian female who has a history of mood lability, anxiety disorder, is married, retired, lives in [place], was evaluated by telemedicine today.  Patient today reports that she has been struggling with some worsening anxiety symptoms.  This has been getting worse since the past several weeks.  She reports she had an upper respiratory infection which contributed to some of her mood symptoms.  Now that has resolved and she continues to feel on edge and restless.  She reports her adult children a son and a daughter currently lives with her due to the COVID-19 outbreak.  She reports they always fight with each other and there is a lot of sibling jealousy which is kind of affecting her since she is caught in the middle of all of it.  Patient reports this is contributing to a lot of anxiety and worry.  She also struggles with sleep.  She reports there are days when she feels like her sleep is so limited that she feels irritable the next day.  She reports her husband  continues to be supportive.  She was following up with Ms. Miguel Dibble and plans to reach out to her soon for another session.  Patient denies any suicidality, homicidality or perceptual disturbances.  Reports she also struggles with chronic pain as well as IBS which are all getting worse due to her current stressors.   Visit Diagnosis:    ICD-10-CM   1. Bipolar I disorder, most recent episode depressed (HCC) F31.30 DULoxetine (CYMBALTA) 30 MG capsule    QUEtiapine (SEROQUEL) 200 MG tablet  2. GAD (generalized anxiety disorder) F41.1 DULoxetine (CYMBALTA) 30 MG capsule    hydrOXYzine (ATARAX/VISTARIL) 25 MG tablet    Past Psychiatric History: Reviewed past psychiatric history from my progress note on 09/11/2018.  Past Medical History:  Past Medical History:  Diagnosis Date  . Anxiety   . Asthma   . Cancer [facility])    SKIN CANCER  . CHF (congestive heart failure) ([place])   . Depression   . Hypertension   . Thyroid disease     Past Surgical History:  Procedure Laterality Date  . ANKLE SURGERY Right 2012  . APPENDECTOMY    . AUGMENTATION MAMMAPLASTY Bilateral   . BREAST ENHANCEMENT SURGERY    . COLONOSCOPY WITH PROPOFOL N/A 02/22/2015   Procedure: COLONOSCOPY WITH PROPOFOL;  Surgeon: Josefine Class, MD;  Location: [facility] ENDOSCOPY;  Service: Endoscopy;  Laterality: N/A;  . ESOPHAGOGASTRODUODENOSCOPY (EGD) WITH PROPOFOL N/A 02/22/2015   Procedure: ESOPHAGOGASTRODUODENOSCOPY (EGD) WITH PROPOFOL;  Surgeon:  Josefine Class, MD;  Location: [facility] ENDOSCOPY;  Service: Endoscopy;  Laterality: N/A;  . ESOPHAGOGASTRODUODENOSCOPY (EGD) WITH PROPOFOL N/A 02/11/2016   Procedure: ESOPHAGOGASTRODUODENOSCOPY (EGD) WITH PROPOFOL;  Surgeon: Manya Silvas, MD;  Location: [facility] ENDOSCOPY;  Service: Endoscopy;  Laterality: N/A;  . HEMORRHOID SURGERY    . NASAL SINUS SURGERY    . SAVORY DILATION N/A 02/22/2015   Procedure: SAVORY DILATION;  Surgeon: Josefine Class, MD;  Location: [facility]  ENDOSCOPY;  Service: Endoscopy;  Laterality: N/A;  . SAVORY DILATION N/A 02/11/2016   Procedure: SAVORY DILATION;  Surgeon: Manya Silvas, MD;  Location: [facility] ENDOSCOPY;  Service: Endoscopy;  Laterality: N/A;  . SKIN CANCER EXCISION      Family Psychiatric History: I have reviewed family psychiatric history from my progress note on 09/11/2018  Family History:  Family History  Problem Relation Age of Onset  . Cancer Mother   . Depression Mother   . Anxiety disorder Mother   . Heart disease Father   . Cancer Father   . Anxiety disorder Father   . Depression Father   . Hypoparathyroidism Son     Social History: Reviewed social history from my progress note on 09/11/2018. Social History   Socioeconomic History  . Marital status: Married    Spouse name: Kaylee Gordon  . Number of children: Not on file  . Years of education: Not on file  . Highest education level: Not on file  Occupational History  . Not on file  Social Needs  . Financial resource strain: Not hard at all  . Food insecurity:    Worry: Never true    Inability: Never true  . Transportation needs:    Medical: No    Non-medical: No  Tobacco Use  . Smoking status: Never Smoker  . Smokeless tobacco: Never Used  Substance and Sexual Activity  . Alcohol use: No    Alcohol/week: 0.0 standard drinks    Comment: MAYBE ONCE OR TWICE A MONTH  . Drug use: No  . Sexual activity: Not Currently    Partners: Male  Lifestyle  . Physical activity:    Days per week: 0 days    Minutes per session: Not on file  . Stress: Very much  Relationships  . Social connections:    Talks on phone: Twice a week    Gets together: Twice a week    Attends religious service: More than 4 times per year    Active member of club or organization: Not on file    Attends meetings of clubs or organizations: Not on file    Relationship status: Married  Other Topics Concern  . Not on file  Social History Narrative  . Not on file     Allergies:  Allergies  Allergen Reactions  . Atorvastatin   . Parafon Forte Dsc [Chlorzoxazone]   . Pollen Extract   . Sudafed [Pseudoephedrine Hcl]     Heart racing    Metabolic Disorder Labs: No results found for: HGBA1C, MPG Lab Results  Component Value Date   PROLACTIN 18.3 07/22/2014   No results found for: CHOL, TRIG, HDL, CHOLHDL, VLDL, LDLCALC Lab Results  Component Value Date   TSH 1.68 04/03/2018   TSH 3.64 10/29/2017    Therapeutic Level Labs: No results found for: LITHIUM No results found for: VALPROATE No components found for:  CBMZ  Current Medications: Current Outpatient Medications  Medication Sig Dispense Refill  . azelastine (ASTELIN) 0.1 % nasal spray Place into the nose.    Marland Kitchen  calcitRIOL (ROCALTROL) 0.25 MCG capsule TAKE ONE CAPSULE BY MOUTH DAILY 30 capsule 2  . DULoxetine (CYMBALTA) 30 MG capsule Take 2 capsules (60 mg total) by mouth daily. 60 capsule 0  . fluticasone (FLONASE) 50 MCG/ACT nasal spray     . gabapentin (NEURONTIN) 300 MG capsule TAKE TWO CAPSULES BY MOUTH EVERY MORNING; THEN ONE CAPSULE BY MOUTH MIDDAY; THEN TAKE TWO CAPSULES BY MOUTH EVERY EVENING    . guaiFENesin-codeine 100-10 MG/5ML syrup Take by mouth.    . hydrOXYzine (ATARAX/VISTARIL) 25 MG tablet Take 0.5-1 tablets (12.5-25 mg total) by mouth 3 (three) times daily as needed for anxiety. For severe anxiety and sleep 90 tablet 0  . hyoscyamine (LEVSIN, ANASPAZ) 0.125 MG tablet Take by mouth.    Marland Kitchen ibuprofen (ADVIL,MOTRIN) 400 MG tablet     . levothyroxine (SYNTHROID, LEVOTHROID) 50 MCG tablet Take 1.5 tablets (75 mcg total) by mouth daily. 45 tablet 3  . lisinopril (PRINIVIL,ZESTRIL) 5 MG tablet TAKE 1 TABLET BY MOUTH ONCE A DAY    . lovastatin (MEVACOR) 20 MG tablet Take 20 mg by mouth at bedtime.    . pantoprazole (PROTONIX) 40 MG tablet Take 1 tablet (40 mg total) by mouth 2 (two) times daily before a meal. 60 tablet 2  . propranolol (INDERAL) 20 MG tablet Take 20 mg by  mouth 2 (two) times daily.     . QUEtiapine (SEROQUEL) 200 MG tablet Take 1 tablet (200 mg total) by mouth at bedtime. 30 tablet 1   No current facility-administered medications for this visit.      Musculoskeletal: Strength & Muscle Tone: UTA Gait & Station: normal Patient leans: N/A  Psychiatric Specialty Exam: Review of Systems  Constitutional: Positive for malaise/fatigue.  Psychiatric/Behavioral: The patient is nervous/anxious and has insomnia.   All other systems reviewed and are negative.   There were no vitals taken for this visit.There is no height or weight on file to calculate BMI.  General Appearance: Casual  Eye Contact:  Fair  Speech:  Clear and Coherent  Volume:  Normal  Mood:  Anxious  Affect:  Congruent  Thought Process:  Goal Directed and Descriptions of Associations: Intact  Orientation:  Full (Time, Place, and Person)  Thought Content: Logical   Suicidal Thoughts:  No  Homicidal Thoughts:  No  Memory:  Immediate;   Fair Recent;   Fair Remote;   Fair  Judgement:  Fair  Insight:  Fair  Psychomotor Activity:  Normal  Concentration:  Concentration: Fair and Attention Span: Fair  Recall:  [employer] of Knowledge: Fair  Language: Fair  Akathisia:  No  Handed:  Right  AIMS (if indicated): Denies tremors, rigidity, stiffness  Assets:  Communication Skills Desire for Improvement Social Support  ADL's:  Intact  Cognition: WNL  Sleep:  Poor   Screenings:   Assessment and Plan: Kaylee Gordon is a 58 year old Caucasian female, married, lives in [place], has a history of bipolar disorder, anxiety disorder, fibromyalgia, IBS, was evaluated by telemedicine today.  Patient continues to struggle with anxiety symptoms and sleep problems which are currently exacerbated by her psychosocial stressors of the COVID-19 outbreak as well as her children having relationship struggles.  Patient will benefit from medication readjustment as well as psychotherapy  sessions.  Plan Bipolar disorder- unstable Continue Cymbalta 60 mg p.o. daily Increase Seroquel to 200 mg p.o. nightly Hydroxyzine 25 mg p.o. 3 times daily as needed  For GAD- unstable Increase Seroquel to 100 mg at bedtime Cymbalta as prescribed Continue  psychotherapy sessions with Ms. Miguel Dibble.  She will reach out to Ms. Thompson today.   Follow-up in clinic in 2 to 3 weeks or sooner if needed.  I have spent atleast 15 minutes non face to face with patient today. More than 50 % of the time was spent for psychoeducation and supportive psychotherapy and care coordination.  This note was generated in part or whole with voice recognition software. Voice recognition is usually quite accurate but there are transcription errors that can and very often do occur. I apologize for any typographical errors that were not detected and corrected.        Ursula Alert, MD 11/14/2018, 8:53 AM

## 2018-11-14 ENCOUNTER — Encounter: Payer: Self-pay | Admitting: Psychiatry

## 2018-11-15 ENCOUNTER — Other Ambulatory Visit: Payer: Self-pay

## 2018-11-15 ENCOUNTER — Ambulatory Visit: Payer: BC Managed Care – PPO | Admitting: Endocrinology

## 2018-11-15 ENCOUNTER — Encounter: Payer: Self-pay | Admitting: Endocrinology

## 2018-11-15 VITALS — BP 120/76 | HR 109 | Ht 65.0 in | Wt 177.8 lb

## 2018-11-15 DIAGNOSIS — E200 Idiopathic hypoparathyroidism: Secondary | ICD-10-CM | POA: Diagnosis not present

## 2018-11-15 DIAGNOSIS — E038 Other specified hypothyroidism: Secondary | ICD-10-CM

## 2018-11-15 LAB — COMPREHENSIVE METABOLIC PANEL
ALT: 13 U/L (ref 0–35)
AST: 14 U/L (ref 0–37)
Albumin: 4.3 g/dL (ref 3.5–5.2)
Alkaline Phosphatase: 105 U/L (ref 39–117)
BUN: 14 mg/dL (ref 6–23)
CO2: 25 mEq/L (ref 19–32)
Calcium: 8.6 mg/dL (ref 8.4–10.5)
Chloride: 104 mEq/L (ref 96–112)
Creatinine, Ser: 1 mg/dL (ref 0.40–1.20)
GFR: 56.71 mL/min — ABNORMAL LOW (ref >60.00)
Glucose, Bld: 143 mg/dL — ABNORMAL HIGH (ref 70–99)
Potassium: 3.5 mEq/L (ref 3.5–5.1)
Sodium: 140 mEq/L (ref 135–145)
Total Bilirubin: 0.3 mg/dL (ref 0.2–1.2)
Total Protein: 7.2 g/dL (ref 6.0–8.3)

## 2018-11-15 LAB — T4, FREE: Free T4: 0.78 ng/dL (ref 0.60–1.60)

## 2018-11-15 LAB — TSH: TSH: 1.76 u[IU]/mL (ref 0.35–4.50)

## 2018-11-15 LAB — VITAMIN D 25 HYDROXY (VIT D DEFICIENCY, FRACTURES): VITD: 37.3 ng/mL (ref 30.00–100.00)

## 2018-11-15 NOTE — Patient Instructions (Signed)
Take Calcium later in day

## 2018-11-15 NOTE — Progress Notes (Signed)
Patient ID: Kaylee Gordon, female   DOB: 07-14-1961, 58 y.o.   MRN: [id number]   History of Present Illness:  Chief complaint: Followup of calcium and thyroid   Hypothyroidism:   She was initially given thyroxine supplement with a borderline TSH levels in 2011 However because of complaints of  fatigue on her visit in 10/15 she had thyroid levels done and free T4 was significantly low She was empirically started on 25 g  of levothyroxine Initially she felt less tired with this; however her free T4 continue to be low and her dose has been increased to 75 g since 1/16 when her free T4 was still low at 0.59 She had a low TSH in 11/16 and since then has been taking 50 mcg daily, this was changed by the PCP   Previously and 4/19 she was found to have a low free T4 and was told to take 75 mcg of levothyroxine However for some reason she did not change her dose and will continue to take 50 mcg On her follow-up in 9/19 Free T4 was however normal on the same dose  Recently did not complain of any unusual fatigue or cold intolerance Her weight is about the same No cold intolerance  She is taking her levothyroxine daily in the morning consistently. Although she has been told not to take her calcium at the same time she thinks she takes all her medications including calcium at the same time  Last free T4 is pending from today   Wt Readings from Last 3 Encounters:  11/15/18 177 lb 12.8 oz (80.6 kg)  08/22/18 181 lb 9.6 oz (82.4 kg)  05/21/18 179 lb 9.6 oz (81.5 kg)    Lab Results  Component Value Date   FREET4 0.91 04/03/2018   FREET4 0.59 (L) 10/29/2017   FREET4 0.87 12/19/2016   TSH 1.68 04/03/2018   TSH 3.64 10/29/2017   TSH 2.03 12/19/2016    Hypoparathyroidism    Has had hypocalcemia since she was 58 years old and presented with symptoms of cramping in her hands along with twitching of her face and eyes. She was diagnosed at [facility] as having primary  idiopathic hypoparathyroidism.    No recent symptoms of  tingling in her face, muscle cramping in her hands or twitching of muscles including eyes. Occasionally has upper leg cramps as before  Calcium level has been fairly consistent in the normal range, pending from today  She is taking calcitriol 0.25 mcg daily and also her calcium tablet which has 1200 mg once daily She has not been missing any doses    Lab Results  Component Value Date   CALCIUM 9.3 04/03/2018   CALCIUM 9.4 10/29/2017   CALCIUM 9.3 12/19/2016   CALCIUM 9.2 01/26/2016   CALCIUM 9.2 09/03/2015       Allergies as of 11/15/2018      Reactions   Atorvastatin    Parafon Forte Dsc [chlorzoxazone]    Pollen Extract    Sudafed [pseudoephedrine Hcl]    Heart racing      Medication List       Accurate as of Nov 15, 2018  3:09 PM. Always use your most recent med list.        azelastine 0.1 % nasal spray Commonly known as:  ASTELIN Place into the nose.   calcitRIOL 0.25 MCG capsule Commonly known as:  ROCALTROL TAKE ONE CAPSULE BY MOUTH DAILY   DULoxetine 30 MG capsule Commonly known as:  CYMBALTA Take 2 capsules (60 mg total) by mouth daily.   fluticasone 50 MCG/ACT nasal spray Commonly known as:  FLONASE   gabapentin 300 MG capsule Commonly known as:  NEURONTIN TAKE TWO CAPSULES BY MOUTH EVERY MORNING; THEN ONE CAPSULE BY MOUTH MIDDAY; THEN TAKE TWO CAPSULES BY MOUTH EVERY EVENING   guaiFENesin-codeine 100-10 MG/5ML syrup Take by mouth.   hydrOXYzine 25 MG tablet Commonly known as:  ATARAX/VISTARIL Take 0.5-1 tablets (12.5-25 mg total) by mouth 3 (three) times daily as needed for anxiety. For severe anxiety and sleep   hyoscyamine 0.125 MG tablet Commonly known as:  LEVSIN Take by mouth.   ibuprofen 400 MG tablet Commonly known as:  ADVIL   levothyroxine 50 MCG tablet Commonly known as:  SYNTHROID Take 1.5 tablets (75 mcg total) by mouth daily.   lisinopril 5 MG tablet Commonly  known as:  ZESTRIL TAKE 1 TABLET BY MOUTH ONCE A DAY   lovastatin 20 MG tablet Commonly known as:  MEVACOR Take 20 mg by mouth at bedtime.   pantoprazole 40 MG tablet Commonly known as:  Protonix Take 1 tablet (40 mg total) by mouth 2 (two) times daily before a meal.   propranolol 20 MG tablet Commonly known as:  INDERAL Take 20 mg by mouth 2 (two) times daily.   QUEtiapine 200 MG tablet Commonly known as:  SEROquel Take 1 tablet (200 mg total) by mouth at bedtime.       Allergies:  Allergies  Allergen Reactions  . Atorvastatin   . Parafon Forte Dsc [Chlorzoxazone]   . Pollen Extract   . Sudafed [Pseudoephedrine Hcl]     Heart racing    Past Medical History:  Diagnosis Date  . Anxiety   . Asthma   . Cancer [facility])    SKIN CANCER  . CHF (congestive heart failure) ([place])   . Depression   . Hypertension   . Thyroid disease     Past Surgical History:  Procedure Laterality Date  . ANKLE SURGERY Right 2012  . APPENDECTOMY    . AUGMENTATION MAMMAPLASTY Bilateral   . BREAST ENHANCEMENT SURGERY    . COLONOSCOPY WITH PROPOFOL N/A 02/22/2015   Procedure: COLONOSCOPY WITH PROPOFOL;  Surgeon: Josefine Class, MD;  Location: [facility] ENDOSCOPY;  Service: Endoscopy;  Laterality: N/A;  . ESOPHAGOGASTRODUODENOSCOPY (EGD) WITH PROPOFOL N/A 02/22/2015   Procedure: ESOPHAGOGASTRODUODENOSCOPY (EGD) WITH PROPOFOL;  Surgeon: Josefine Class, MD;  Location: [facility] ENDOSCOPY;  Service: Endoscopy;  Laterality: N/A;  . ESOPHAGOGASTRODUODENOSCOPY (EGD) WITH PROPOFOL N/A 02/11/2016   Procedure: ESOPHAGOGASTRODUODENOSCOPY (EGD) WITH PROPOFOL;  Surgeon: Manya Silvas, MD;  Location: [facility] ENDOSCOPY;  Service: Endoscopy;  Laterality: N/A;  . HEMORRHOID SURGERY    . NASAL SINUS SURGERY    . SAVORY DILATION N/A 02/22/2015   Procedure: SAVORY DILATION;  Surgeon: Josefine Class, MD;  Location: [facility] ENDOSCOPY;  Service: Endoscopy;  Laterality: N/A;  . SAVORY DILATION N/A 02/11/2016   Procedure:  SAVORY DILATION;  Surgeon: Manya Silvas, MD;  [facility] ENDOSCOPY;  Service: Endoscopy;  Laterality: N/A;  . SKIN CANCER EXCISION      Family History  Problem Relation Age of Onset  . Cancer Mother   . Depression Mother   . Anxiety disorder Mother   . Heart disease Father   . Cancer Father   . Anxiety disorder Father   . Depression Father   . Hypoparathyroidism Son     Social History:  reports that she has never smoked. She has never  used smokeless tobacco. She reports that she does not drink alcohol or use drugs.  Review of Systems    History of depression, is on duloxetine and Seroquel long-term    EXAM:  BP 120/76 (BP Location: Left Arm, Patient Position: Sitting, Cuff Size: Normal)   Pulse (!) 109   Ht 5\' 5"  (1.651 m)   Wt 177 lb 12.8 oz (80.6 kg)   SpO2 94%   BMI 29.59 kg/m   Thyroid is not palpable  Biceps reflexes show normal relaxation [place] sign is negative  Assessment/Plan:   HYPOTHYROIDISM:  She has secondary hypothyroidism with  low baseline free T4 and normal TSH.   Currently on 50 mcg levothyroxine Her last free T4 was normal She does not complain of any new fatigue, tends to have some persistent fatigue as before  She now says that she is taking her calcium in the morning with her thyroid medication and advised her not to do so Need to check her thyroid functions today    HYPOPARATHYROIDISM, idiopathic:  Her calcium has been consistently well controlled using current regimen of 0.25 mcg calcitriol and calcium 1200 mg a day No recurrence of twitching or cramps  She will have labs checked today  Also will check vitamin D level to check on her nutritional status  Follow-up in 6 months if labs normal   Elayne Snare 11/15/2018, 3:09 PM    Addendum: Labs except nonfasting glucose  Office Visit on 11/15/2018  Component Date Value Ref Range Status  . VITD 11/15/2018 37.30  30.00 - 100.00 ng/mL Final  . Free T4 11/15/2018 0.78   0.60 - 1.60 ng/dL Final   Comment: Specimens from patients who are undergoing biotin therapy and /or ingesting biotin supplements may contain high levels of biotin.  The higher biotin concentration in these specimens interferes with this Free T4 assay.  Specimens that contain high levels  of biotin may cause false high results for this Free T4 assay.  Please interpret results in light of the total clinical presentation of the patient.    Marland Kitchen TSH 11/15/2018 1.76  0.35 - 4.50 uIU/mL Final  . Sodium 11/15/2018 140  135 - 145 mEq/L Final  . Potassium 11/15/2018 3.5  3.5 - 5.1 mEq/L Final  . Chloride 11/15/2018 104  96 - 112 mEq/L Final  . CO2 11/15/2018 25  19 - 32 mEq/L Final  . Glucose, Bld 11/15/2018 143* 70 - 99 mg/dL Final  . BUN 11/15/2018 14  6 - 23 mg/dL Final  . Creatinine, Ser 11/15/2018 1.00  0.40 - 1.20 mg/dL Final  . Total Bilirubin 11/15/2018 0.3  0.2 - 1.2 mg/dL Final  . Alkaline Phosphatase 11/15/2018 105  39 - 117 U/L Final  . AST 11/15/2018 14  0 - 37 U/L Final  . ALT 11/15/2018 13  0 - 35 U/L Final  . Total Protein 11/15/2018 7.2  6.0 - 8.3 g/dL Final  . Albumin 11/15/2018 4.3  3.5 - 5.2 g/dL Final  . Calcium 11/15/2018 8.6  8.4 - 10.5 mg/dL Final  . GFR 11/15/2018 56.71* >60.00 mL/min Final

## 2018-11-16 ENCOUNTER — Encounter: Payer: Self-pay | Admitting: Endocrinology

## 2018-11-16 NOTE — Progress Notes (Signed)
Please call to let patient know that the lab results including vitamin D, calcium and thyroid are normal.  She will continue all medications as before except take calcium at least a couple of hours after her levothyroxine

## 2018-11-21 DIAGNOSIS — Z862 Personal history of diseases of the blood and blood-forming organs and certain disorders involving the immune mechanism: Secondary | ICD-10-CM | POA: Insufficient documentation

## 2018-11-21 DIAGNOSIS — D508 Other iron deficiency anemias: Secondary | ICD-10-CM | POA: Insufficient documentation

## 2018-11-22 DIAGNOSIS — E611 Iron deficiency: Secondary | ICD-10-CM | POA: Insufficient documentation

## 2018-11-22 DIAGNOSIS — E538 Deficiency of other specified B group vitamins: Secondary | ICD-10-CM | POA: Insufficient documentation

## 2018-11-28 ENCOUNTER — Other Ambulatory Visit: Payer: Self-pay

## 2018-11-28 ENCOUNTER — Ambulatory Visit (INDEPENDENT_AMBULATORY_CARE_PROVIDER_SITE_OTHER): Payer: BC Managed Care – PPO | Admitting: Psychiatry

## 2018-11-28 ENCOUNTER — Encounter: Payer: Self-pay | Admitting: Psychiatry

## 2018-11-28 DIAGNOSIS — F3130 Bipolar disorder, current episode depressed, mild or moderate severity, unspecified: Secondary | ICD-10-CM | POA: Diagnosis not present

## 2018-11-28 DIAGNOSIS — F411 Generalized anxiety disorder: Secondary | ICD-10-CM

## 2018-11-28 MED ORDER — DULOXETINE HCL 60 MG PO CPEP: 60.0000 mg | ORAL_CAPSULE | Freq: Every day | ORAL | 1 refills | Status: DC

## 2018-11-28 MED ORDER — DIVALPROEX SODIUM ER 500 MG PO TB24: 500.0000 mg | ORAL_TABLET | Freq: Every day | ORAL | 0 refills | Status: DC

## 2018-11-28 NOTE — Progress Notes (Signed)
Virtual Visit via Video Note  I connected with Kaylee Gordon on 11/28/18 at  9:45 AM EDT by a video enabled telemedicine application and verified that I am speaking with the correct person using two identifiers.   I discussed the limitations of evaluation and management by telemedicine and the availability of in person appointments. The patient expressed understanding and agreed to proceed.    I discussed the assessment and treatment plan with the patient. The patient was provided an opportunity to ask questions and all were answered. The patient agreed with the plan and demonstrated an understanding of the instructions.   The patient was advised to call back or seek an in-person evaluation if the symptoms worsen or if the condition fails to improve as anticipated.   [place] MD OP Progress Note  11/28/2018 12:51 PM Kaylee Gordon  MRN:  [id number]  Chief Complaint:  Chief Complaint    Follow-up     HPI: Kaylee Gordon is a 58 year old Caucasian female who has a history of mood lability, anxiety disorder, married, retired, lives in [place], was evaluated by telemedicine today.  Patient reports that she continues to struggle with mood lability, irritability.  She reports anything can make her snap.  She reports her family is tired of her.  She reports she is worried about her children who were fighting with each other all the time.  She reports she had a talk with them and now they are slightly better.  She reports the Seroquel is helpful.  She reports the Seroquel does help her to stay asleep and that is an improvement for her.  It may also be helping to some extent with her mood.  She continues to be compliant with Cymbalta.  She reports she has reached out to Ms. Miguel Dibble and has started psychotherapy sessions again.  The therapy sessions have been very helpful.  She wants to continue to do that.  She reports she and her children are planning to go to the beach for a week to get away.   She is hoping that will help her with her mood symptoms also.  Discussed adding Depakote as a mood stabilizer.  She has never tried it before.  Provided medication education.  Discussed with her that Depakote level has to be done after a week of starting the medication.  She agrees with plan.   Visit Diagnosis:    ICD-10-CM   1. Bipolar I disorder, most recent episode depressed (HCC) F31.30 divalproex (DEPAKOTE ER) 500 MG 24 hr tablet  2. GAD (generalized anxiety disorder) F41.1 DULoxetine (CYMBALTA) 60 MG capsule    Past Psychiatric History: Reviewed past psychiatric history from my progress note on 09/11/2018.  Past Medical History:  Past Medical History:  Diagnosis Date  . Anxiety   . Asthma   . Cancer [facility])    SKIN CANCER  . CHF (congestive heart failure) ([place])   . Depression   . Hypertension   . Thyroid disease     Past Surgical History:  Procedure Laterality Date  . ANKLE SURGERY Right 2012  . APPENDECTOMY    . AUGMENTATION MAMMAPLASTY Bilateral   . BREAST ENHANCEMENT SURGERY    . COLONOSCOPY WITH PROPOFOL N/A 02/22/2015   Procedure: COLONOSCOPY WITH PROPOFOL;  Surgeon: Josefine Class, MD;  Location: [facility] ENDOSCOPY;  Service: Endoscopy;  Laterality: N/A;  . ESOPHAGOGASTRODUODENOSCOPY (EGD) WITH PROPOFOL N/A 02/22/2015   Procedure: ESOPHAGOGASTRODUODENOSCOPY (EGD) WITH PROPOFOL;  Surgeon: Josefine Class, MD;  Location: [facility] ENDOSCOPY;  Service:  Endoscopy;  Laterality: N/A;  . ESOPHAGOGASTRODUODENOSCOPY (EGD) WITH PROPOFOL N/A 02/11/2016   Procedure: ESOPHAGOGASTRODUODENOSCOPY (EGD) WITH PROPOFOL;  Surgeon: Manya Silvas, MD;  Location: [facility] ENDOSCOPY;  Service: Endoscopy;  Laterality: N/A;  . HEMORRHOID SURGERY    . NASAL SINUS SURGERY    . SAVORY DILATION N/A 02/22/2015   Procedure: SAVORY DILATION;  Surgeon: Josefine Class, MD;  Location: [facility] ENDOSCOPY;  Service: Endoscopy;  Laterality: N/A;  . SAVORY DILATION N/A 02/11/2016   Procedure: SAVORY DILATION;   Surgeon: Manya Silvas, MD;  Location: [facility] ENDOSCOPY;  Service: Endoscopy;  Laterality: N/A;  . SKIN CANCER EXCISION      Family Psychiatric History: Reviewed family psychiatric history from my progress note on 09/11/2018.  Family History:  Family History  Problem Relation Age of Onset  . Cancer Mother   . Depression Mother   . Anxiety disorder Mother   . Heart disease Father   . Cancer Father   . Anxiety disorder Father   . Depression Father   . Hypoparathyroidism Son     Social History: I have reviewed social history from my progress note on 09/11/2018. Social History   Socioeconomic History  . Marital status: Married    Spouse name: Legrand Como  . Number of children: Not on file  . Years of education: Not on file  . Highest education level: Not on file  Occupational History  . Not on file  Social Needs  . Financial resource strain: Not hard at all  . Food insecurity:    Worry: Never true    Inability: Never true  . Transportation needs:    Medical: No    Non-medical: No  Tobacco Use  . Smoking status: Never Smoker  . Smokeless tobacco: Never Used  Substance and Sexual Activity  . Alcohol use: No    Alcohol/week: 0.0 standard drinks    Comment: MAYBE ONCE OR TWICE A MONTH  . Drug use: No  . Sexual activity: Not Currently    Partners: Male  Lifestyle  . Physical activity:    Days per week: 0 days    Minutes per session: Not on file  . Stress: Very much  Relationships  . Social connections:    Talks on phone: Twice a week    Gets together: Twice a week    Attends religious service: More than 4 times per year    Active member of club or organization: Not on file    Attends meetings of clubs or organizations: Not on file    Relationship status: Married  Other Topics Concern  . Not on file  Social History Narrative  . Not on file    Allergies:  Allergies  Allergen Reactions  . Atorvastatin   . Parafon Forte Dsc [Chlorzoxazone]   . Pollen Extract   .  Sudafed [Pseudoephedrine Hcl]     Heart racing    Metabolic Disorder Labs: No results found for: HGBA1C, MPG Lab Results  Component Value Date   PROLACTIN 18.3 07/22/2014   No results found for: CHOL, TRIG, HDL, CHOLHDL, VLDL, LDLCALC Lab Results  Component Value Date   TSH 1.76 11/15/2018   TSH 1.68 04/03/2018    Therapeutic Level Labs: No results found for: LITHIUM No results found for: VALPROATE No components found for:  CBMZ  Current Medications: Current Outpatient Medications  Medication Sig Dispense Refill  . azelastine (ASTELIN) 0.1 % nasal spray Place into the nose.    . calcitRIOL (ROCALTROL) 0.25 MCG capsule TAKE  ONE CAPSULE BY MOUTH DAILY 30 capsule 2  . divalproex (DEPAKOTE ER) 500 MG 24 hr tablet Take 1 tablet (500 mg total) by mouth daily with supper. For mood 90 tablet 0  . DULoxetine (CYMBALTA) 60 MG capsule Take 1 capsule (60 mg total) by mouth daily. 90 capsule 1  . ferrous sulfate 325 (65 FE) MG tablet Take by mouth.    . fluticasone (FLONASE) 50 MCG/ACT nasal spray     . gabapentin (NEURONTIN) 300 MG capsule TAKE TWO CAPSULES BY MOUTH EVERY MORNING; THEN ONE CAPSULE BY MOUTH MIDDAY; THEN TAKE TWO CAPSULES BY MOUTH EVERY EVENING    . guaiFENesin-codeine 100-10 MG/5ML syrup Take by mouth.    . hydrOXYzine (ATARAX/VISTARIL) 25 MG tablet Take 0.5-1 tablets (12.5-25 mg total) by mouth 3 (three) times daily as needed for anxiety. For severe anxiety and sleep 90 tablet 0  . hyoscyamine (LEVSIN, ANASPAZ) 0.125 MG tablet Take by mouth.    Marland Kitchen ibuprofen (ADVIL,MOTRIN) 400 MG tablet     . levothyroxine (SYNTHROID, LEVOTHROID) 50 MCG tablet Take 1.5 tablets (75 mcg total) by mouth daily. 45 tablet 3  . lisinopril (PRINIVIL,ZESTRIL) 5 MG tablet TAKE 1 TABLET BY MOUTH ONCE A DAY    . lovastatin (MEVACOR) 20 MG tablet Take 20 mg by mouth at bedtime.    . pantoprazole (PROTONIX) 40 MG tablet Take 1 tablet (40 mg total) by mouth 2 (two) times daily before a meal. 60 tablet 2   . propranolol (INDERAL) 20 MG tablet Take 20 mg by mouth 2 (two) times daily.     . QUEtiapine (SEROQUEL) 200 MG tablet Take 1 tablet (200 mg total) by mouth at bedtime. 30 tablet 1  . vitamin B-12 (CYANOCOBALAMIN) 1000 MCG tablet Take by mouth.     No current facility-administered medications for this visit.      Musculoskeletal: Strength & Muscle Tone: UTA Gait & Station: UTA Patient leans: N/A  Psychiatric Specialty Exam: Review of Systems  Psychiatric/Behavioral: The patient is nervous/anxious.   All other systems reviewed and are negative.   There were no vitals taken for this visit.There is no height or weight on file to calculate BMI.  General Appearance: Casual  Eye Contact:  Fair  Speech:  Normal Rate  Volume:  Normal  Mood:  Anxious  Affect:  Congruent  Thought Process:  Goal Directed and Descriptions of Associations: Intact  Orientation:  Full (Time, Place, and Person)  Thought Content: Logical   Suicidal Thoughts:  No  Homicidal Thoughts:  No  Memory:  Immediate;   Fair Recent;   Fair Remote;   Fair  Judgement:  Fair  Insight:  Fair  Psychomotor Activity:  Normal  Concentration:  Concentration: Fair and Attention Span: Fair  Recall:  [employer] of Knowledge: Fair  Language: Fair  Akathisia:  No  Handed:  Right  AIMS (if indicated): Denies tremors, rigidity,stiffness  Assets:  Communication Skills Desire for Improvement Housing Social Support  ADL's:  Intact  Cognition: WNL  Sleep:  Fair   Screenings:   Assessment and Plan: Azari is a 58 year old Caucasian female, married, lives in [place], has a history of bipolar disorder, anxiety disorder, fibromyalgia, IBS was evaluated by telemedicine today.  Patient continues to struggle with mood lability.  She does have several psychosocial stressors including COVID-19 outbreak, children having relationship struggles and so on.  Patient will continue to benefit from medication readjustment as well as  psychotherapy sessions.  Plan Bipolar disorder-unstable Continue Cymbalta 60 mg  p.o. daily Add Depakote ER 500 mg p.o. daily with supper. Seroquel 200 mg p.o. nightly Hydroxyzine 25 mg p.o. 3 times daily as needed Discussed with her to go to [facility] in a week to get Depakote levels done.  Will mail her lab slip to her today.  For GAD-some improvement Cymbalta as prescribed Seroquel 200 mg p.o. nightly. We will continue psychotherapy sessions with Ms. Miguel Dibble.  I reviewed the most recent labs including CMP-dated 11/15/2018-within normal limits, TSH-within normal limits at 1.76.  Patient was recently diagnosed with iron deficiency and vitamin B12 deficiency and is currently on replacement per PMD.  Follow-up in clinic in 3 to 4 weeks or sooner if needed.  Appointment scheduled for June 15 at 2:30 PM.  I have spent atleast 25 minutes non face to face with patient today. More than 50 % of the time was spent for psychoeducation and supportive psychotherapy and care coordination.  This note was generated in part or whole with voice recognition software. Voice recognition is usually quite accurate but there are transcription errors that can and very often do occur. I apologize for any typographical errors that were not detected and corrected.        Ursula Alert, MD 11/28/2018, 12:51 PM

## 2018-12-11 ENCOUNTER — Ambulatory Visit
Admission: RE | Admit: 2018-12-11 | Discharge: 2018-12-11 | Disposition: A | Discharge status: Home / Self Care | Payer: BC Managed Care – PPO | Source: Ambulatory Visit | Attending: Pulmonary Disease | Admitting: Pulmonary Disease

## 2018-12-11 ENCOUNTER — Other Ambulatory Visit: Payer: Self-pay

## 2018-12-11 DIAGNOSIS — R918 Other nonspecific abnormal finding of lung field: Secondary | ICD-10-CM

## 2018-12-12 ENCOUNTER — Ambulatory Visit (INDEPENDENT_AMBULATORY_CARE_PROVIDER_SITE_OTHER): Payer: BC Managed Care – PPO | Admitting: Pulmonary Disease

## 2018-12-12 ENCOUNTER — Telehealth: Payer: Self-pay

## 2018-12-12 ENCOUNTER — Encounter: Payer: Self-pay | Admitting: Pulmonary Disease

## 2018-12-12 VITALS — BP 108/68 | HR 89 | Ht 64.0 in | Wt 180.6 lb

## 2018-12-12 DIAGNOSIS — R918 Other nonspecific abnormal finding of lung field: Secondary | ICD-10-CM

## 2018-12-12 DIAGNOSIS — J383 Other diseases of vocal cords: Secondary | ICD-10-CM | POA: Diagnosis not present

## 2018-12-12 DIAGNOSIS — K219 Gastro-esophageal reflux disease without esophagitis: Secondary | ICD-10-CM

## 2018-12-12 DIAGNOSIS — Z85820 Personal history of malignant melanoma of skin: Secondary | ICD-10-CM

## 2018-12-12 DIAGNOSIS — R911 Solitary pulmonary nodule: Secondary | ICD-10-CM

## 2018-12-12 DIAGNOSIS — K449 Diaphragmatic hernia without obstruction or gangrene: Secondary | ICD-10-CM

## 2018-12-12 NOTE — Telephone Encounter (Signed)
Called patient for COVID-19 pre-screening for in office visit.  Have you recently traveled any where out of the local area in the last 2 weeks? No  Have you been in close contact with a person diagnosed with COVID-19 within the last 2 weeks? No   Do you currently have any of the following symptoms? If so, when did they start? Cough     Diarrhea   Joint Pain Fever      Muscle Pain   Red eyes Shortness of breath   Abdominal pain  Vomiting Loss of smell    Rash    Sore Throat Headache    Weakness   Bruising or bleeding  Okay to proceed with visit.

## 2018-12-12 NOTE — Patient Instructions (Signed)
1.  We will see her in follow-up in 6 months time.  We will do a CT scan of the chest at that time.  If the CT scan shows that the nodules are unchanged no further CT scans will be needed.  2.  Recommend that lisinopril be discontinued due to your issues with vocal cord dysfunction.  Discussed with your primary care with regards to what other medications can be used to control your blood pressure.  Even if you have been on lisinopril for a long period time you can still develop problems at a later time with the medication.  Given your issues with the vocal cord, and continued throat clearing I recommend this medication be switched.

## 2018-12-12 NOTE — Progress Notes (Signed)
Subjective:    Patient ID: Kaylee Gordon, female    DOB: [birth year], 59 y.o.   MRN: [id number]  HPI The patient is a 58 year old lifelong never smoker who follows here for the issue of multiple lung nodules.  She had percutaneous biopsy of 1.5 cm sentinel nodule in the right lower lobe.  This was performed on October 2019.  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  These nodules have been followed expectantly with repeated CT scans of the chest she had a CT performed yesterday, no changes noted.  The previously biopsy nodule appears less solid.  This is consistent with inflammatory change.  The patient continues to have some issues with constant throat clearing she has been evaluated by ENT at [facility].  This was via video conference.  A prior flow-volume loop showed changes consistent with vocal cord dysfunction.  In the past it has been recommended that she discontinue lisinopril however, she continues to take this medication as she is concerned that her blood pressure will increase to unacceptable levels.  I have urged her to discuss this with her primary care physician to choose alternative medication for blood pressure control.  She continues to have some issues with dyspnea that she attributes to her anxiety.  Her anxiety sometimes "paralyzes her".  She has had no evidence of obstruction on PFTs previously and bronchodilators are of no help.  She does have cardiac issues and some of her dyspnea may be related to this as well as deconditioning.  Today she has no new complaint.  She has had no fevers, chills or sweats.  No exposures to anyone with COVID-19.  She has remained pretty much at home during the pandemic.  She continues to have limitations on activity mostly because of anxiety.    Review of Systems  Constitutional: Positive for fatigue.  HENT: Positive for postnasal drip, rhinorrhea and voice change.   Eyes:  Negative.   Respiratory: Positive for cough and shortness of breath.   Cardiovascular: Positive for palpitations.  Gastrointestinal:       Distant reflux symptoms though somewhat better with PPI.  Endocrine: Negative.   Genitourinary: Negative.   Skin: Negative.   Neurological: Negative.   Hematological: Negative.   Psychiatric/Behavioral: Positive for sleep disturbance (Occasional insomnia). The patient is nervous/anxious.   All other systems reviewed and are negative.      Objective:   Physical Exam Vitals signs and nursing note reviewed.  Constitutional:      General: She is not in acute distress.    Appearance: Normal appearance. She is overweight.  HENT:     Head: Normocephalic and atraumatic.     Right Ear: External ear normal.     Left Ear: External ear normal.     Nose:     Comments: Limited nose/mouth and throat exam due to masking requirements for COVID-19. Eyes:     General: No scleral icterus.    Extraocular Movements: Extraocular movements intact.     Conjunctiva/sclera: Conjunctivae normal.     Pupils: Pupils are equal, round, and reactive to light.  Neck:     Musculoskeletal: Neck supple.     Trachea: Phonation normal. No tracheal deviation.  Cardiovascular:     Rate and Rhythm: Normal rate and regular rhythm.     Pulses: Normal pulses.     Heart sounds: Normal heart sounds, S1 normal and S2 normal.  Pulmonary:     Effort:  Pulmonary effort is normal.     Breath sounds: Normal breath sounds.  Abdominal:     General: There is no distension.  Musculoskeletal: Normal range of motion.     Right lower leg: No edema.     Left lower leg: No edema.  Skin:    General: Skin is warm and dry.  Neurological:     General: No focal deficit present.     Mental Status: She is alert and oriented to person, place, and time.  Psychiatric:        Mood and Affect: Mood is anxious.     The chest CT performed yesterday was reviewed independently.  She continues to have  some right lower lobe nodules that have a change in size.  The larger sentinel nodule that was biopsied is less solid than previous size unchanged.      Assessment & Plan:   1.  Multiple lung nodules on the right lower lobe: These have been unchanged since October 2019.  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  We will follow this finding in 6 months time with a CT scan of the chest, noncontrasted.  If they are stable at that time recommend CT in a year and if stable no further CTs.  2.  Lateral hernia with gastroesophageal reflux: This issue adds complexity to her management.  Suspect that her pulmonary nodules are sequela from chronic silent aspiration.  Patient is currently on a PPI and has been recommended that she continue this.  She may require fundoplication if her symptoms persist.  3.  Vocal cord dysfunction: Continue management of reflux.  As directed by ENT avoid drying medications.  I have recommended that she discontinue lisinopril.  She is concerned about blood pressure increase off of this medication I recommend that she discuss with her primary care physician to provide her with alternative medication.  I have reminded her that it does not matter how long she has been on this medication is still can cause symptoms particularly in the upper airway. Because of her paradoxical vocal cord motion I recommend that lisinopril be discontinued.  4.  Patient has a history of malignant melanoma: No evidence of recurrence.   This chart was dictated using voice recognition software/Dragon.  Despite best efforts to proofread, errors can occur which can change the meaning.  Any change was purely unintentional.

## 2018-12-19 ENCOUNTER — Other Ambulatory Visit: Payer: Self-pay | Admitting: Pulmonary Disease

## 2018-12-19 ENCOUNTER — Other Ambulatory Visit: Payer: Self-pay | Admitting: Endocrinology

## 2018-12-27 ENCOUNTER — Other Ambulatory Visit: Payer: Self-pay | Admitting: Pulmonary Disease

## 2018-12-27 NOTE — Telephone Encounter (Signed)
Received Rx request from [facility] for pantoprazole 40mg  bid.  Per last OV note, pt was to increased pantoprazole to 2 tablets for one month then decrease to 1 daily.  Rx for pantoprazole for once daily has been sent to preferred pharmacy.  Nothing further is needed.

## 2018-12-30 ENCOUNTER — Encounter: Payer: Self-pay | Admitting: Psychiatry

## 2018-12-30 ENCOUNTER — Other Ambulatory Visit: Payer: Self-pay

## 2018-12-30 ENCOUNTER — Ambulatory Visit (INDEPENDENT_AMBULATORY_CARE_PROVIDER_SITE_OTHER): Payer: BC Managed Care – PPO | Admitting: Psychiatry

## 2018-12-30 DIAGNOSIS — F411 Generalized anxiety disorder: Secondary | ICD-10-CM | POA: Diagnosis not present

## 2018-12-30 DIAGNOSIS — F3130 Bipolar disorder, current episode depressed, mild or moderate severity, unspecified: Secondary | ICD-10-CM | POA: Insufficient documentation

## 2018-12-30 MED ORDER — HYDROXYZINE HCL 25 MG PO TABS: 12.5000 mg | ORAL_TABLET | Freq: Three times a day (TID) | ORAL | 1 refills | Status: DC | PRN

## 2018-12-30 MED ORDER — QUETIAPINE FUMARATE 100 MG PO TABS: 150.0000 mg | ORAL_TABLET | Freq: Every day | ORAL | 1 refills | Status: DC

## 2018-12-30 MED ORDER — QUETIAPINE FUMARATE 50 MG PO TABS: 50.0000 mg | ORAL_TABLET | Freq: Every evening | ORAL | 1 refills | Status: DC | PRN

## 2018-12-30 NOTE — Progress Notes (Signed)
Virtual Visit via Video Note  I connected with Kaylee Gordon on 12/30/18 at  2:30 PM EDT by a video enabled telemedicine application and verified that I am speaking with the correct person using two identifiers.   I discussed the limitations of evaluation and management by telemedicine and the availability of in person appointments. The patient expressed understanding and agreed to proceed.     I discussed the assessment and treatment plan with the patient. The patient was provided an opportunity to ask questions and all were answered. The patient agreed with the plan and demonstrated an understanding of the instructions.   The patient was advised to call back or seek an in-person evaluation if the symptoms worsen or if the condition fails to improve as anticipated.   [place] MD OP Progress Note  12/30/2018 5:13 PM Kaylee Gordon  MRN:  [id number]  Chief Complaint:  Chief Complaint    Follow-up     HPI: Kaylee Gordon is a 58 year old Caucasian female who has a history of bipolar disorder, GAD, married, retired, lives in [place] will was evaluated by telemedicine today.  Patient reports her mood lability and irritability is improving.  She reports she is able to better cope with situational stressors like her children fighting with each other.  She reports she is able to emotionally detach herself from their problems.  She however reports she continues to struggle with fatigue during the day.  She is not sure if the medications are contributing to it or it is due to her fibromyalgia.  She reports her Seroquel is really helpful with her sleep.  She continues to be compliant with her Depakote and Cymbalta.  She also has been making use of hydroxyzine as needed which helps.  She reports she continues to be in psychotherapy session with Ms. Miguel Dibble.  Patient reports she was not able to get her Depakote levels done.  Will order Depakote labs again, will mail it to her today.  Discussed  reducing her Seroquel dosage at bedtime and using a small dose as needed for the nights that she cannot sleep to see if that will help with her fatigue during the day.  She agrees with plan. Visit Diagnosis:    ICD-10-CM   1. Bipolar I disorder, most recent episode depressed (HCC)  F31.30 QUEtiapine (SEROQUEL) 100 MG tablet    QUEtiapine (SEROQUEL) 50 MG tablet  2. GAD (generalized anxiety disorder)  F41.1 hydrOXYzine (ATARAX/VISTARIL) 25 MG tablet    Past Psychiatric History: I have reviewed past psychiatric history from my progress note on 09/11/2018.  Past Medical History:  Past Medical History:  Diagnosis Date  . Anxiety   . Asthma   . Cancer [facility])    SKIN CANCER  . CHF (congestive heart failure) ([place])   . Depression   . Hypertension   . Thyroid disease     Past Surgical History:  Procedure Laterality Date  . ANKLE SURGERY Right 2012  . APPENDECTOMY    . AUGMENTATION MAMMAPLASTY Bilateral   . BREAST ENHANCEMENT SURGERY    . COLONOSCOPY WITH PROPOFOL N/A 02/22/2015   Procedure: COLONOSCOPY WITH PROPOFOL;  Surgeon: Josefine Class, MD;  Location: [facility] ENDOSCOPY;  Service: Endoscopy;  Laterality: N/A;  . ESOPHAGOGASTRODUODENOSCOPY (EGD) WITH PROPOFOL N/A 02/22/2015   Procedure: ESOPHAGOGASTRODUODENOSCOPY (EGD) WITH PROPOFOL;  Surgeon: Josefine Class, MD;  Location: [facility] ENDOSCOPY;  Service: Endoscopy;  Laterality: N/A;  . ESOPHAGOGASTRODUODENOSCOPY (EGD) WITH PROPOFOL N/A 02/11/2016   Procedure: ESOPHAGOGASTRODUODENOSCOPY (EGD) WITH PROPOFOL;  Surgeon: Manya Silvas, MD;  Location: [facility] ENDOSCOPY;  Service: Endoscopy;  Laterality: N/A;  . HEMORRHOID SURGERY    . NASAL SINUS SURGERY    . SAVORY DILATION N/A 02/22/2015   Procedure: SAVORY DILATION;  Surgeon: Josefine Class, MD;  Location: [facility] ENDOSCOPY;  Service: Endoscopy;  Laterality: N/A;  . SAVORY DILATION N/A 02/11/2016   Procedure: SAVORY DILATION;  Surgeon: Manya Silvas, MD;  Location: [facility] ENDOSCOPY;   Service: Endoscopy;  Laterality: N/A;  . SKIN CANCER EXCISION      Family Psychiatric History: I have reviewed family psychiatric history from my progress note on 09/11/2018.  Family History:  Family History  Problem Relation Age of Onset  . Cancer Mother   . Depression Mother   . Anxiety disorder Mother   . Heart disease Father   . Cancer Father   . Anxiety disorder Father   . Depression Father   . Hypoparathyroidism Son     Social History: I have reviewed social history from my progress note on 09/11/2018. Social History   Socioeconomic History  . Marital status: Married    Spouse name: Legrand Como  . Number of children: Not on file  . Years of education: Not on file  . Highest education level: Not on file  Occupational History  . Not on file  Social Needs  . Financial resource strain: Not hard at all  . Food insecurity    Worry: Never true    Inability: Never true  . Transportation needs    Medical: No    Non-medical: No  Tobacco Use  . Smoking status: Never Smoker  . Smokeless tobacco: Never Used  Substance and Sexual Activity  . Alcohol use: No    Alcohol/week: 0.0 standard drinks    Comment: MAYBE ONCE OR TWICE A MONTH  . Drug use: No  . Sexual activity: Not Currently    Partners: Male  Lifestyle  . Physical activity    Days per week: 0 days    Minutes per session: Not on file  . Stress: Very much  Relationships  . Social Herbalist on phone: Twice a week    Gets together: Twice a week    Attends religious service: More than 4 times per year    Active member of club or organization: Not on file    Attends meetings of clubs or organizations: Not on file    Relationship status: Married  Other Topics Concern  . Not on file  Social History Narrative  . Not on file    Allergies:  Allergies  Allergen Reactions  . Atorvastatin   . Parafon Forte Dsc [Chlorzoxazone]   . Pollen Extract   . Sudafed [Pseudoephedrine Hcl]     Heart racing     Metabolic Disorder Labs: No results found for: HGBA1C, MPG Lab Results  Component Value Date   PROLACTIN 18.3 07/22/2014   No results found for: CHOL, TRIG, HDL, CHOLHDL, VLDL, LDLCALC Lab Results  Component Value Date   TSH 1.76 11/15/2018   TSH 1.68 04/03/2018    Therapeutic Level Labs: No results found for: LITHIUM No results found for: VALPROATE No components found for:  CBMZ  Current Medications: Current Outpatient Medications  Medication Sig Dispense Refill  . hydrochlorothiazide (HYDRODIURIL) 12.5 MG tablet Take by mouth.    Marland Kitchen azelastine (ASTELIN) 0.1 % nasal spray Place into the nose.    . calcitRIOL (ROCALTROL) 0.25 MCG capsule TAKE ONE CAPSULE BY MOUTH DAILY 30  capsule 2  . divalproex (DEPAKOTE ER) 500 MG 24 hr tablet Take 1 tablet (500 mg total) by mouth daily with supper. For mood 90 tablet 0  . DULoxetine (CYMBALTA) 60 MG capsule Take 1 capsule (60 mg total) by mouth daily. 90 capsule 1  . ferrous sulfate 325 (65 FE) MG tablet Take by mouth.    . fluticasone (FLONASE) 50 MCG/ACT nasal spray     . gabapentin (NEURONTIN) 300 MG capsule TAKE TWO CAPSULES BY MOUTH EVERY MORNING; THEN ONE CAPSULE BY MOUTH MIDDAY; THEN TAKE TWO CAPSULES BY MOUTH EVERY EVENING    . guaiFENesin-codeine 100-10 MG/5ML syrup Take by mouth.    . hydrOXYzine (ATARAX/VISTARIL) 25 MG tablet Take 0.5-1 tablets (12.5-25 mg total) by mouth 3 (three) times daily as needed for anxiety. For severe anxiety and sleep 90 tablet 1  . hyoscyamine (LEVSIN, ANASPAZ) 0.125 MG tablet Take by mouth.    . levothyroxine (SYNTHROID) 50 MCG tablet TAKE 1.5 TABLETS DAILY 45 tablet 2  . lisinopril (PRINIVIL,ZESTRIL) 5 MG tablet TAKE 1 TABLET BY MOUTH ONCE A DAY    . lovastatin (MEVACOR) 20 MG tablet Take 20 mg by mouth at bedtime.    . pantoprazole (PROTONIX) 40 MG tablet Take 1 tablet (40 mg total) by mouth daily. 30 tablet 1  . propranolol (INDERAL) 20 MG tablet Take 20 mg by mouth 2 (two) times daily.     .  QUEtiapine (SEROQUEL) 100 MG tablet Take 1.5 tablets (150 mg total) by mouth at bedtime. 45 tablet 1  . QUEtiapine (SEROQUEL) 50 MG tablet Take 1 tablet (50 mg total) by mouth at bedtime as needed. For mood and sleep 30 tablet 1  . vitamin B-12 (CYANOCOBALAMIN) 1000 MCG tablet Take by mouth.     No current facility-administered medications for this visit.      Musculoskeletal: Strength & Muscle Tone: within normal limits Gait & Station: normal Patient leans: N/A  Psychiatric Specialty Exam: Review of Systems  Constitutional: Positive for malaise/fatigue.  Psychiatric/Behavioral: The patient is nervous/anxious.   All other systems reviewed and are negative.   There were no vitals taken for this visit.There is no height or weight on file to calculate BMI.  General Appearance: Casual  Eye Contact:  Fair  Speech:  Clear and Coherent  Volume:  Normal  Mood:  Anxious  Affect:  Congruent  Thought Process:  Goal Directed and Descriptions of Associations: Intact  Orientation:  Full (Time, Place, and Person)  Thought Content: Logical   Suicidal Thoughts:  No  Homicidal Thoughts:  No  Memory:  Immediate;   Fair Recent;   Fair Remote;   Fair  Judgement:  Fair  Insight:  Fair  Psychomotor Activity:  Normal  Concentration:  Concentration: Fair and Attention Span: Fair  Recall:  [employer] of Knowledge: Fair  Language: Fair  Akathisia:  No  Handed:  Right  AIMS (if indicated): denies tremors, rigidity  Assets:  Communication Skills Desire for Improvement Social Support  ADL's:  Intact  Cognition: WNL  Sleep:  Fair   Screenings:   Assessment and Plan: Kaylee Gordon is a 58 year old Caucasian female, married, lives in [place], has a history of bipolar disorder, anxiety disorder, fibromyalgia, IBS was evaluated by telemedicine today.  Patient continues to struggle with some fatigue during the day however reports mood symptoms is improving.  She continues to have psychosocial stressors  including COVID-19 outbreak and relationship struggles between her children.  She will continue psychotherapy sessions.  Plan as noted below.  Plan Bipolar disorder- improving Cymbalta 60 mg p.o. daily Depakote ER 500 mg p.o. daily with supper Discussed with patient to reduce Seroquel to 150 mg p.o. nightly. She can use Seroquel 50 mg p.o. nightly as needed for mood as well as sleep. Hydroxyzine 25 mg p.o. 3 times daily PRN. Will order labs again-Depakote level pending.  Will mail lab slip to her today.  For GAD-improving Cymbalta as prescribed Seroquel reduced as discussed above. We will continue psychotherapy session with Ms. Miguel Dibble.  Follow-up in clinic in 1 month or sooner if needed.  August 3 at 2 PM.  I have spent atleast 25 minutes non face to face with patient today. More than 50 % of the time was spent for psychoeducation and supportive psychotherapy and care coordination.  This note was generated in part or whole with voice recognition software. Voice recognition is usually quite accurate but there are transcription errors that can and very often do occur. I apologize for any typographical errors that were not detected and corrected.       Ursula Alert, MD 12/30/2018, 5:13 PM

## 2019-01-09 ENCOUNTER — Other Ambulatory Visit: Payer: Self-pay | Admitting: Psychiatry

## 2019-01-09 DIAGNOSIS — F3130 Bipolar disorder, current episode depressed, mild or moderate severity, unspecified: Secondary | ICD-10-CM

## 2019-01-23 ENCOUNTER — Other Ambulatory Visit: Payer: Self-pay | Admitting: Psychiatry

## 2019-01-23 ENCOUNTER — Other Ambulatory Visit: Payer: Self-pay | Admitting: Endocrinology

## 2019-01-23 DIAGNOSIS — F3130 Bipolar disorder, current episode depressed, mild or moderate severity, unspecified: Secondary | ICD-10-CM

## 2019-01-23 DIAGNOSIS — F411 Generalized anxiety disorder: Secondary | ICD-10-CM

## 2019-02-04 ENCOUNTER — Other Ambulatory Visit: Payer: Self-pay | Admitting: Psychiatry

## 2019-02-05 ENCOUNTER — Telehealth: Payer: Self-pay

## 2019-02-05 LAB — CBC WITH DIFFERENTIAL/PLATELET
Basophils Absolute: 0.1 10*3/uL (ref 0.0–0.2)
Basos: 1 %
EOS (ABSOLUTE): 0.2 10*3/uL (ref 0.0–0.4)
Eos: 4 %
Hematocrit: 37 % (ref 34.0–46.6)
Hemoglobin: 11.9 g/dL (ref 11.1–15.9)
Immature Grans (Abs): 0 10*3/uL (ref 0.0–0.1)
Immature Granulocytes: 1 %
Lymphocytes Absolute: 2.1 10*3/uL (ref 0.7–3.1)
Lymphs: 37 %
MCH: 27.1 pg (ref 26.6–33.0)
MCHC: 32.2 g/dL (ref 31.5–35.7)
MCV: 84 fL (ref 79–97)
Monocytes Absolute: 0.5 10*3/uL (ref 0.1–0.9)
Monocytes: 9 %
Neutrophils Absolute: 2.7 10*3/uL (ref 1.4–7.0)
Neutrophils: 48 %
Platelets: 311 10*3/uL (ref 150–450)
RBC: 4.39 x10E6/uL (ref 3.77–5.28)
RDW: 16.5 % — ABNORMAL HIGH (ref 11.7–15.4)
WBC: 5.6 10*3/uL (ref 3.4–10.8)

## 2019-02-05 LAB — VALPROIC ACID LEVEL: Valproic Acid Lvl: 39 ug/mL — ABNORMAL LOW (ref 50–100)

## 2019-02-05 NOTE — Telephone Encounter (Signed)
received a copy of labwork, (which i faxed to your office) rdw is high 16.5 and depakote levels is low 39

## 2019-02-06 NOTE — Telephone Encounter (Signed)
Reviewed labs. Platelet wnl, depakote subtherapeutic. Would advise her to stay on current dose of depakote at this time. Would defer further adjustment to Dr. Shea Evans.

## 2019-02-17 ENCOUNTER — Other Ambulatory Visit: Payer: Self-pay

## 2019-02-17 ENCOUNTER — Ambulatory Visit (INDEPENDENT_AMBULATORY_CARE_PROVIDER_SITE_OTHER): Payer: BC Managed Care – PPO | Admitting: Psychiatry

## 2019-02-17 ENCOUNTER — Encounter: Payer: Self-pay | Admitting: Psychiatry

## 2019-02-17 DIAGNOSIS — F411 Generalized anxiety disorder: Secondary | ICD-10-CM | POA: Diagnosis not present

## 2019-02-17 DIAGNOSIS — F3130 Bipolar disorder, current episode depressed, mild or moderate severity, unspecified: Secondary | ICD-10-CM | POA: Diagnosis not present

## 2019-02-17 MED ORDER — HYDROXYZINE HCL 25 MG PO TABS: 12.5000 mg | ORAL_TABLET | Freq: Three times a day (TID) | ORAL | 1 refills | Status: DC | PRN

## 2019-02-17 MED ORDER — DULOXETINE HCL 60 MG PO CPEP: 60.0000 mg | ORAL_CAPSULE | Freq: Every day | ORAL | 1 refills | Status: DC

## 2019-02-17 MED ORDER — QUETIAPINE FUMARATE 50 MG PO TABS: 50.0000 mg | ORAL_TABLET | Freq: Every evening | ORAL | 1 refills | Status: DC | PRN

## 2019-02-17 MED ORDER — QUETIAPINE FUMARATE 100 MG PO TABS: 150.0000 mg | ORAL_TABLET | Freq: Every day | ORAL | 1 refills | Status: DC

## 2019-02-17 MED ORDER — BENZTROPINE MESYLATE 0.5 MG PO TABS: 0.5000 mg | ORAL_TABLET | Freq: Every day | ORAL | 1 refills | Status: DC | PRN

## 2019-02-17 MED ORDER — DULOXETINE HCL 20 MG PO CPEP: 20.0000 mg | ORAL_CAPSULE | Freq: Every day | ORAL | 1 refills | Status: DC

## 2019-02-17 MED ORDER — DIVALPROEX SODIUM ER 500 MG PO TB24: 1000.0000 mg | ORAL_TABLET | Freq: Every day | ORAL | 1 refills | Status: DC

## 2019-02-17 NOTE — Progress Notes (Signed)
Virtual Visit via Video Note  I connected with Kaylee Gordon on 02/17/19 at  2:00 PM EDT by a video enabled telemedicine application and verified that I am speaking with the correct person using two identifiers.   I discussed the limitations of evaluation and management by telemedicine and the availability of in person appointments. The patient expressed understanding and agreed to proceed.   I discussed the assessment and treatment plan with the patient. The patient was provided an opportunity to ask questions and all were answered. The patient agreed with the plan and demonstrated an understanding of the instructions.   The patient was advised to call back or seek an in-person evaluation if the symptoms worsen or if the condition fails to improve as anticipated.   [place] MD OP Progress Note  02/17/2019 5:29 PM KINSLIE HOVE  MRN:  [id number]  Chief Complaint:  Chief Complaint    Follow-up     HPI: Kaylee Gordon is a 58 year old Caucasian female who has a history of bipolar disorder, GAD, fibromyalgia, married, retired, lives in [place], was evaluated by telemedicine today.  Patient today reports she continues to struggle with mood lability.  She reports she struggles with some anxiety symptoms as well as sadness.  She also struggles with fatigue and tiredness during the day.  She is aware that that could also be due to her fibromyalgia.  She reports sleep was fair.  There are times when she sleeps excessively even during the day.  Patient denies any suicidality, homicidality or perceptual disturbances.  Patient reports she continues to work with her therapist.  Patient reports she is tolerating the Depakote well.  Discussed her labs with patient.  Depakote level was subtherapeutic recently.  Discussed readjusting her dosage.  She will talk to her other providers about her fatigue and fibromyalgia symptoms.  She also struggles with some muscle spasms.  Unknown if this is due to  Seroquel or her fibromyalgia.  However discussed starting Cogentin as needed and she agrees with plan.  Visit Diagnosis:    ICD-10-CM   1. Bipolar I disorder, most recent episode depressed (HCC)  F31.30 divalproex (DEPAKOTE ER) 500 MG 24 hr tablet    benztropine (COGENTIN) 0.5 MG tablet    QUEtiapine (SEROQUEL) 100 MG tablet    QUEtiapine (SEROQUEL) 50 MG tablet    Valproic acid level  2. GAD (generalized anxiety disorder)  F41.1 DULoxetine (CYMBALTA) 20 MG capsule    DULoxetine (CYMBALTA) 60 MG capsule    hydrOXYzine (ATARAX/VISTARIL) 25 MG tablet    Past Psychiatric History: I have reviewed past psychiatric history from my progress note on 09/11/2018.  Past Medical History:  Past Medical History:  Diagnosis Date  . Anxiety   . Asthma   . Cancer [facility])    SKIN CANCER  . CHF (congestive heart failure) ([place])   . Depression   . Hypertension   . Thyroid disease     Past Surgical History:  Procedure Laterality Date  . ANKLE SURGERY Right 2012  . APPENDECTOMY    . AUGMENTATION MAMMAPLASTY Bilateral   . BREAST ENHANCEMENT SURGERY    . COLONOSCOPY WITH PROPOFOL N/A 02/22/2015   Procedure: COLONOSCOPY WITH PROPOFOL;  Surgeon: Josefine Class, MD;  Location: [facility] ENDOSCOPY;  Service: Endoscopy;  Laterality: N/A;  . ESOPHAGOGASTRODUODENOSCOPY (EGD) WITH PROPOFOL N/A 02/22/2015   Procedure: ESOPHAGOGASTRODUODENOSCOPY (EGD) WITH PROPOFOL;  Surgeon: Josefine Class, MD;  Location: [facility] ENDOSCOPY;  Service: Endoscopy;  Laterality: N/A;  . ESOPHAGOGASTRODUODENOSCOPY (EGD) WITH PROPOFOL N/A  02/11/2016   Procedure: ESOPHAGOGASTRODUODENOSCOPY (EGD) WITH PROPOFOL;  Surgeon: Manya Silvas, MD;  Location: [facility] ENDOSCOPY;  Service: Endoscopy;  Laterality: N/A;  . HEMORRHOID SURGERY    . NASAL SINUS SURGERY    . SAVORY DILATION N/A 02/22/2015   Procedure: SAVORY DILATION;  Surgeon: Josefine Class, MD;  Location: [facility] ENDOSCOPY;  Service: Endoscopy;  Laterality: N/A;  . SAVORY DILATION N/A  02/11/2016   Procedure: SAVORY DILATION;  Surgeon: Manya Silvas, MD;  Location: [facility] ENDOSCOPY;  Service: Endoscopy;  Laterality: N/A;  . SKIN CANCER EXCISION      Family Psychiatric History: I have reviewed family psychiatric history from my progress note on 09/11/2018.  Family History:  Family History  Problem Relation Age of Onset  . Cancer Mother   . Depression Mother   . Anxiety disorder Mother   . Heart disease Father   . Cancer Father   . Anxiety disorder Father   . Depression Father   . Hypoparathyroidism Son     Social History: I have reviewed social history from my progress note on 09/11/2018. Social History   Socioeconomic History  . Marital status: Married    Spouse name: Legrand Como  . Number of children: Not on file  . Years of education: Not on file  . Highest education level: Not on file  Occupational History  . Not on file  Social Needs  . Financial resource strain: Not hard at all  . Food insecurity    Worry: Never true    Inability: Never true  . Transportation needs    Medical: No    Non-medical: No  Tobacco Use  . Smoking status: Never Smoker  . Smokeless tobacco: Never Used  Substance and Sexual Activity  . Alcohol use: No    Alcohol/week: 0.0 standard drinks    Comment: MAYBE ONCE OR TWICE A MONTH  . Drug use: No  . Sexual activity: Not Currently    Partners: Male  Lifestyle  . Physical activity    Days per week: 0 days    Minutes per session: Not on file  . Stress: Very much  Relationships  . Social Herbalist on phone: Twice a week    Gets together: Twice a week    Attends religious service: More than 4 times per year    Active member of club or organization: Not on file    Attends meetings of clubs or organizations: Not on file    Relationship status: Married  Other Topics Concern  . Not on file  Social History Narrative  . Not on file    Allergies:  Allergies  Allergen Reactions  . Atorvastatin   . Parafon Forte  Dsc [Chlorzoxazone]   . Pollen Extract   . Sudafed [Pseudoephedrine Hcl]     Heart racing    Metabolic Disorder Labs: No results found for: HGBA1C, MPG Lab Results  Component Value Date   PROLACTIN 18.3 07/22/2014   No results found for: CHOL, TRIG, HDL, CHOLHDL, VLDL, LDLCALC Lab Results  Component Value Date   TSH 1.76 11/15/2018   TSH 1.68 04/03/2018    Therapeutic Level Labs: No results found for: LITHIUM Lab Results  Component Value Date   VALPROATE 39 (L) 02/04/2019   No components found for:  CBMZ  Current Medications: Current Outpatient Medications  Medication Sig Dispense Refill  . azelastine (ASTELIN) 0.1 % nasal spray Place into the nose.    . benztropine (COGENTIN) 0.5 MG tablet Take  1 tablet (0.5 mg total) by mouth daily as needed for tremors. For muscle spasms 30 tablet 1  . calcitRIOL (ROCALTROL) 0.25 MCG capsule TAKE ONE CAPSULE BY MOUTH DAILY 30 capsule 1  . divalproex (DEPAKOTE ER) 500 MG 24 hr tablet Take 2 tablets (1,000 mg total) by mouth daily. 180 tablet 1  . DULoxetine (CYMBALTA) 20 MG capsule Take 1 capsule (20 mg total) by mouth daily. To be combined with 60 mg 30 capsule 1  . DULoxetine (CYMBALTA) 60 MG capsule Take 1 capsule (60 mg total) by mouth daily. To be combined with 20 mg 30 capsule 1  . ferrous sulfate 325 (65 FE) MG tablet Take by mouth.    . fluticasone (FLONASE) 50 MCG/ACT nasal spray     . gabapentin (NEURONTIN) 300 MG capsule TAKE TWO CAPSULES BY MOUTH EVERY MORNING; THEN ONE CAPSULE BY MOUTH MIDDAY; THEN TAKE TWO CAPSULES BY MOUTH EVERY EVENING    . guaiFENesin-codeine 100-10 MG/5ML syrup Take by mouth.    . hydrochlorothiazide (HYDRODIURIL) 12.5 MG tablet Take by mouth.    . hydrOXYzine (ATARAX/VISTARIL) 25 MG tablet Take 0.5-1 tablets (12.5-25 mg total) by mouth 3 (three) times daily as needed for anxiety. For severe anxiety and sleep 90 tablet 1  . hyoscyamine (LEVSIN, ANASPAZ) 0.125 MG tablet Take by mouth.    . levothyroxine  (SYNTHROID) 50 MCG tablet TAKE 1.5 TABLETS DAILY 45 tablet 2  . lisinopril (PRINIVIL,ZESTRIL) 5 MG tablet TAKE 1 TABLET BY MOUTH ONCE A DAY    . lovastatin (MEVACOR) 20 MG tablet Take 20 mg by mouth at bedtime.    . pantoprazole (PROTONIX) 40 MG tablet Take 1 tablet (40 mg total) by mouth daily. 30 tablet 1  . propranolol (INDERAL) 20 MG tablet Take 20 mg by mouth 2 (two) times daily.     . QUEtiapine (SEROQUEL) 100 MG tablet Take 1.5 tablets (150 mg total) by mouth at bedtime. 45 tablet 1  . QUEtiapine (SEROQUEL) 50 MG tablet Take 1 tablet (50 mg total) by mouth at bedtime as needed. For mood and sleep 30 tablet 1  . vitamin B-12 (CYANOCOBALAMIN) 1000 MCG tablet Take by mouth.     No current facility-administered medications for this visit.      Musculoskeletal: Strength & Muscle Tone: UTA Gait & Station: normal Patient leans: N/A  Psychiatric Specialty Exam: Review of Systems  Constitutional: Positive for malaise/fatigue.  Psychiatric/Behavioral: Positive for depression. The patient is nervous/anxious.   All other systems reviewed and are negative.   There were no vitals taken for this visit.There is no height or weight on file to calculate BMI.  General Appearance: Casual  Eye Contact:  Fair  Speech:  Clear and Coherent  Volume:  Normal  Mood:  Anxious and Depressed  Affect:  Appropriate  Thought Process:  Goal Directed and Descriptions of Associations: Intact  Orientation:  Full (Time, Place, and Person)  Thought Content: Logical   Suicidal Thoughts:  No  Homicidal Thoughts:  No  Memory:  Immediate;   Fair Recent;   Fair Remote;   Fair  Judgement:  Fair  Insight:  Fair  Psychomotor Activity:  Normal  Concentration:  Concentration: Fair and Attention Span: Fair  Recall:  [employer] of Knowledge: Fair  Language: Fair  Akathisia:  No  Handed:  Right  AIMS (if indicated): Denies tremors, rigidity  Assets:  Communication Skills Desire for Improvement Social Support   ADL's:  Intact  Cognition: WNL  Sleep:  Fair  Screenings:   Assessment and Plan: Kaylee Gordon is a 58 year old Caucasian female, married, lives in [place], has a history of bipolar disorder, anxiety disorder, fibromyalgia, IBS was evaluated by telemedicine today.  Patient continues to struggle with mood lability as well as fatigue.  Patient will benefit from medication readjustment.  She however does have fibromyalgia which could also be contributing to some of the symptoms.  She also has psychosocial stressors of her daughter going back to school as well as the COVID-19.  She will continue to work with her therapist.  Plan Bipolar disorder- some progress Increase Depakote ER to 1000 mg p.o. daily with supper Seroquel 150 mg p.o. nightly Seroquel 50 mg p.o. nightly as needed for mood as well as sleep as needed. Hydroxyzine 25 mg p.o. 3 times daily as needed   For GAD-unstable Increase Cymbalta to 80 mg p.o. daily Seroquel reduced dosage as discussed above. She will continue to work with her therapist.  Reviewed labs - depakote level -02/04/2019-subtherapeutic at 54.  Will order Depakote level, CMP today.  Will mail lab slip to her.  She will go to [facility].  Follow-up in clinic in 4 weeks or sooner.  I have spent atleast 25 minutes non face to face with patient today. More than 50 % of the time was spent for psychoeducation and supportive psychotherapy and care coordination.  This note was generated in part or whole with voice recognition software. Voice recognition is usually quite accurate but there are transcription errors that can and very often do occur. I apologize for any typographical errors that were not detected and corrected.       Ursula Alert, MD 02/17/2019, 5:29 PM

## 2019-02-22 ENCOUNTER — Other Ambulatory Visit: Payer: Self-pay | Admitting: Psychiatry

## 2019-02-22 DIAGNOSIS — F3130 Bipolar disorder, current episode depressed, mild or moderate severity, unspecified: Secondary | ICD-10-CM

## 2019-02-28 ENCOUNTER — Telehealth: Payer: Self-pay | Admitting: Endocrinology

## 2019-02-28 NOTE — Telephone Encounter (Signed)
Patient called and advised that she is experiencing the following symptoms, Muscle spasms in hands, dropping things, facial twitches,and buckling knees.  Feels like maybe her calcium levels are very off.  Would like a call back to help her figure out what to do or if not this practice who to contact instead

## 2019-03-03 NOTE — Telephone Encounter (Signed)
Needs to come for labs

## 2019-03-03 NOTE — Telephone Encounter (Signed)
Called pt and left detailed voicemail to call and schedule labs.

## 2019-03-04 ENCOUNTER — Telehealth: Payer: Self-pay

## 2019-03-04 ENCOUNTER — Telehealth: Payer: Self-pay | Admitting: Pulmonary Disease

## 2019-03-04 DIAGNOSIS — F3130 Bipolar disorder, current episode depressed, mild or moderate severity, unspecified: Secondary | ICD-10-CM

## 2019-03-04 MED ORDER — QUETIAPINE FUMARATE 100 MG PO TABS: 100.0000 mg | ORAL_TABLET | Freq: Every day | ORAL | 1 refills | Status: DC

## 2019-03-04 NOTE — Telephone Encounter (Signed)
pt states that a medical request form should have been sent yesterday to get her medical records.  she needs to make sure that it was sent and she also want a copy emailed to her. she has to be seen tomorrow.Marland Kitchen

## 2019-03-04 NOTE — Telephone Encounter (Signed)
pt is having muscle spasms so bad she fell . the divalproex is not working for her and the benztropine is also feels as it is cause issues also. she like for you to call her. she going to get labwork done to check her calicum. levels.

## 2019-03-04 NOTE — Telephone Encounter (Signed)
Called patient - discussed going down on seroquel to 100 mg since she had a dizzy spell and had a fall. Also advised to talk to PMD regarding her antihypertensives.

## 2019-03-04 NOTE — Telephone Encounter (Signed)
Received disability forms from disability determination services. These forms have been faxed to [employer].  Nothing further is needed.

## 2019-03-05 ENCOUNTER — Other Ambulatory Visit: Payer: Self-pay | Admitting: Endocrinology

## 2019-03-05 ENCOUNTER — Other Ambulatory Visit: Payer: Self-pay

## 2019-03-05 ENCOUNTER — Other Ambulatory Visit (INDEPENDENT_AMBULATORY_CARE_PROVIDER_SITE_OTHER): Payer: BC Managed Care – PPO

## 2019-03-05 DIAGNOSIS — E200 Idiopathic hypoparathyroidism: Secondary | ICD-10-CM | POA: Diagnosis not present

## 2019-03-06 LAB — BASIC METABOLIC PANEL
BUN: 24 mg/dL — ABNORMAL HIGH (ref 6–23)
CO2: 28 mEq/L (ref 19–32)
Calcium: 9.4 mg/dL (ref 8.4–10.5)
Chloride: 101 mEq/L (ref 96–112)
Creatinine, Ser: 1.21 mg/dL — ABNORMAL HIGH (ref 0.40–1.20)
GFR: 45.47 mL/min — ABNORMAL LOW (ref >60.00)
Glucose, Bld: 140 mg/dL — ABNORMAL HIGH (ref 70–99)
Potassium: 3.6 mEq/L (ref 3.5–5.1)
Sodium: 141 mEq/L (ref 135–145)

## 2019-03-07 ENCOUNTER — Encounter: Payer: Self-pay | Admitting: Endocrinology

## 2019-03-07 ENCOUNTER — Other Ambulatory Visit (INDEPENDENT_AMBULATORY_CARE_PROVIDER_SITE_OTHER): Payer: BC Managed Care – PPO

## 2019-03-07 ENCOUNTER — Other Ambulatory Visit: Payer: Self-pay

## 2019-03-07 ENCOUNTER — Ambulatory Visit (INDEPENDENT_AMBULATORY_CARE_PROVIDER_SITE_OTHER): Payer: BC Managed Care – PPO | Admitting: Endocrinology

## 2019-03-07 DIAGNOSIS — R252 Cramp and spasm: Secondary | ICD-10-CM

## 2019-03-07 DIAGNOSIS — E200 Idiopathic hypoparathyroidism: Secondary | ICD-10-CM

## 2019-03-07 LAB — MAGNESIUM: Magnesium: 2 mg/dL (ref 1.5–2.5)

## 2019-03-07 NOTE — Progress Notes (Signed)
Patient ID: Kaylee Gordon, female   DOB: January 17, 1961, 58 y.o.   MRN: [id number]  Today's office visit was provided via telemedicine using video technique The patient was explained the limitations of evaluation and management by telemedicine and the availability of in person appointments.  The patient understood the limitations and agreed to proceed. Patient also understood that the telehealth visit is billable. . Location of the patient: Patient's home . Location of the provider: Physician office Only the patient and myself were participating in the encounter    History of Present Illness:  Chief complaint: Muscle cramps and twitching   Hypothyroidism: This problem was not addressed today, following is a copy of the previous note  She was initially given thyroxine supplement with a borderline TSH levels in 2011 However because of complaints of  fatigue on her visit in 10/15 she had thyroid levels done and free T4 was significantly low She was empirically started on 25 g  of levothyroxine Initially she felt less tired with this; however her free T4 continue to be low and her dose has been increased to 75 g since 1/16 when her free T4 was still low at 0.59 She had a low TSH in 11/16 and since then has been taking 50 mcg daily, this was changed by the PCP   Previously and 4/19 she was found to have a low free T4 and was told to take 75 mcg of levothyroxine However for some reason she did not change her dose and will continue to take 50 mcg On her follow-up in 9/19 Free T4 was however normal on the same dose  Recently did not complain of any unusual fatigue or cold intolerance Her weight is about the same No cold intolerance  She is taking her levothyroxine daily in the morning consistently. Although she has been told not to take her calcium at the same time she thinks she takes all her medications including calcium at the same time   Wt Readings from Last 3  Encounters:  12/12/18 180 lb 9.6 oz (81.9 kg)  11/15/18 177 lb 12.8 oz (80.6 kg)  08/22/18 181 lb 9.6 oz (82.4 kg)    Lab Results  Component Value Date   FREET4 0.78 11/15/2018   FREET4 0.91 04/03/2018   FREET4 0.59 (L) 10/29/2017   TSH 1.76 11/15/2018   TSH 1.68 04/03/2018   TSH 3.64 10/29/2017    Hypoparathyroidism    Has had hypocalcemia since she was 58 years old and presented with symptoms of cramping in her hands along with twitching of her face and eyes. She was diagnosed at [facility] as having primary idiopathic hypoparathyroidism.   Recently she has been complaining of twitching in her face, muscle cramping in her hands and sometimes all over.  Once she had an episode of twitching and felt weak in her legs and fell down  She was given Cogentin by her psychiatrist since she is taking Seroquel and her Seroquel dose has been reduced Muscle cramps are slightly better since then She is also on HCTZ for hypertension   She is taking calcitriol 0.25 mcg daily and also her calcium tablet which has 1200 mg once daily She takes these very regularly   Calcium level was low normal in 5/20 but normal now  Lab Results  Component Value Date   CALCIUM 9.4 03/05/2019   CALCIUM 8.6 11/15/2018   CALCIUM 9.3 04/03/2018   CALCIUM 9.4 10/29/2017  CALCIUM 9.3 12/19/2016   Lab Results  Component Value Date   K 3.6 03/05/2019       Allergies as of 03/07/2019      Reactions   Atorvastatin    Parafon Forte Dsc [chlorzoxazone]    Pollen Extract    Sudafed [pseudoephedrine Hcl]    Heart racing      Medication List       Accurate as of March 07, 2019  9:39 AM. If you have any questions, ask your nurse or doctor.        azelastine 0.1 % nasal spray Commonly known as: ASTELIN Place into the nose.   benztropine 0.5 MG tablet Commonly known as: COGENTIN Take 1 tablet (0.5 mg total) by mouth daily as needed for tremors. For muscle spasms   calcitRIOL 0.25 MCG  capsule Commonly known as: ROCALTROL TAKE ONE CAPSULE BY MOUTH DAILY   divalproex 500 MG 24 hr tablet Commonly known as: DEPAKOTE ER Take 2 tablets (1,000 mg total) by mouth daily.   DULoxetine 20 MG capsule Commonly known as: CYMBALTA Take 1 capsule (20 mg total) by mouth daily. To be combined with 60 mg   DULoxetine 60 MG capsule Commonly known as: Cymbalta Take 1 capsule (60 mg total) by mouth daily. To be combined with 20 mg   ferrous sulfate 325 (65 FE) MG tablet Take by mouth.   fluticasone 50 MCG/ACT nasal spray Commonly known as: FLONASE   gabapentin 300 MG capsule Commonly known as: NEURONTIN TAKE TWO CAPSULES BY MOUTH EVERY MORNING; THEN ONE CAPSULE BY MOUTH MIDDAY; THEN TAKE TWO CAPSULES BY MOUTH EVERY EVENING   guaiFENesin-codeine 100-10 MG/5ML syrup Take by mouth.   hydrochlorothiazide 12.5 MG tablet Commonly known as: HYDRODIURIL Take by mouth.   hydrOXYzine 25 MG tablet Commonly known as: ATARAX/VISTARIL Take 0.5-1 tablets (12.5-25 mg total) by mouth 3 (three) times daily as needed for anxiety. For severe anxiety and sleep   hyoscyamine 0.125 MG tablet Commonly known as: LEVSIN Take by mouth.   levothyroxine 50 MCG tablet Commonly known as: SYNTHROID TAKE 1.5 TABLETS DAILY   lisinopril 5 MG tablet Commonly known as: ZESTRIL TAKE 1 TABLET BY MOUTH ONCE A DAY   lovastatin 20 MG tablet Commonly known as: MEVACOR Take 20 mg by mouth at bedtime.   pantoprazole 40 MG tablet Commonly known as: PROTONIX Take 1 tablet (40 mg total) by mouth daily.   propranolol 20 MG tablet Commonly known as: INDERAL Take 20 mg by mouth 2 (two) times daily.   QUEtiapine 50 MG tablet Commonly known as: SEROquel Take 1 tablet (50 mg total) by mouth at bedtime as needed. For mood and sleep   QUEtiapine 100 MG tablet Commonly known as: SEROquel Take 1 tablet (100 mg total) by mouth at bedtime.   vitamin B-12 1000 MCG tablet Commonly known as: CYANOCOBALAMIN  Take by mouth.       Allergies:  Allergies  Allergen Reactions  . Atorvastatin   . Parafon Forte Dsc [Chlorzoxazone]   . Pollen Extract   . Sudafed [Pseudoephedrine Hcl]     Heart racing    Past Medical History:  Diagnosis Date  . Anxiety   . Asthma   . Cancer [facility])    SKIN CANCER  . CHF (congestive heart failure) ([place])   . Depression   . Hypertension   . Thyroid disease     Past Surgical History:  Procedure Laterality Date  . ANKLE SURGERY Right 2012  . APPENDECTOMY    .  AUGMENTATION MAMMAPLASTY Bilateral   . BREAST ENHANCEMENT SURGERY    . COLONOSCOPY WITH PROPOFOL N/A 02/22/2015   Procedure: COLONOSCOPY WITH PROPOFOL;  Surgeon: Josefine Class, MD;  Location: [facility] ENDOSCOPY;  Service: Endoscopy;  Laterality: N/A;  . ESOPHAGOGASTRODUODENOSCOPY (EGD) WITH PROPOFOL N/A 02/22/2015   Procedure: ESOPHAGOGASTRODUODENOSCOPY (EGD) WITH PROPOFOL;  Surgeon: Josefine Class, MD;  Location: [facility] ENDOSCOPY;  Service: Endoscopy;  Laterality: N/A;  . ESOPHAGOGASTRODUODENOSCOPY (EGD) WITH PROPOFOL N/A 02/11/2016   Procedure: ESOPHAGOGASTRODUODENOSCOPY (EGD) WITH PROPOFOL;  Surgeon: Manya Silvas, MD;  Location: [facility] ENDOSCOPY;  Service: Endoscopy;  Laterality: N/A;  . HEMORRHOID SURGERY    . NASAL SINUS SURGERY    . SAVORY DILATION N/A 02/22/2015   Procedure: SAVORY DILATION;  Surgeon: Josefine Class, MD;  Location: [facility] ENDOSCOPY;  Service: Endoscopy;  Laterality: N/A;  . SAVORY DILATION N/A 02/11/2016   Procedure: SAVORY DILATION;  Surgeon: Manya Silvas, MD;  Location: [facility] ENDOSCOPY;  Service: Endoscopy;  Laterality: N/A;  . SKIN CANCER EXCISION      Family History  Problem Relation Age of Onset  . Cancer Mother   . Depression Mother   . Anxiety disorder Mother   . Heart disease Father   . Cancer Father   . Anxiety disorder Father   . Depression Father   . Hypoparathyroidism Son     Social History:  reports that she has never smoked. She has never used  smokeless tobacco. She reports that she does not drink alcohol or use drugs.  Review of Systems    History of depression, is on duloxetine and Seroquel long-term    EXAM:  There were no vitals taken for this visit.  Patient is remote  Assessment/Plan:    Muscle cramps:  She is having muscle cramps and twitching and even though this is suggestive of hypocalcemia her calcium level is quite normal and has been stable with her calcitriol and calcium supplements as before She has some improvement in her spasms with taking benztropine to counteract the effects of her Seroquel  However not clear if she has hypomagnesemia since she is on HCTZ and her potassium is low normal  She will empirically try magnesium supplements twice a day  Also needs to follow-up with PCP regarding her blood pressure and she is having concerns about this Also has relatively higher creatinine level and she may not be tolerating HCTZ that has been used  She will follow-up as scheduled in November   Elayne Snare 03/07/2019, 9:39 AM

## 2019-03-17 ENCOUNTER — Other Ambulatory Visit: Payer: Self-pay | Admitting: Endocrinology

## 2019-03-25 ENCOUNTER — Other Ambulatory Visit: Payer: Self-pay

## 2019-03-25 ENCOUNTER — Ambulatory Visit: Payer: BC Managed Care – PPO | Admitting: Psychiatry

## 2019-03-27 ENCOUNTER — Encounter: Payer: Self-pay | Admitting: Psychiatry

## 2019-03-27 ENCOUNTER — Ambulatory Visit (INDEPENDENT_AMBULATORY_CARE_PROVIDER_SITE_OTHER): Payer: BC Managed Care – PPO | Admitting: Psychiatry

## 2019-03-27 ENCOUNTER — Other Ambulatory Visit: Payer: Self-pay

## 2019-03-27 DIAGNOSIS — F3130 Bipolar disorder, current episode depressed, mild or moderate severity, unspecified: Secondary | ICD-10-CM

## 2019-03-27 DIAGNOSIS — F411 Generalized anxiety disorder: Secondary | ICD-10-CM

## 2019-03-27 MED ORDER — BENZTROPINE MESYLATE 0.5 MG PO TABS: 0.5000 mg | ORAL_TABLET | Freq: Every day | ORAL | 1 refills | Status: DC | PRN

## 2019-03-27 MED ORDER — DULOXETINE HCL 60 MG PO CPEP: 60.0000 mg | ORAL_CAPSULE | Freq: Every day | ORAL | 1 refills | Status: DC

## 2019-03-27 MED ORDER — QUETIAPINE FUMARATE 50 MG PO TABS: 50.0000 mg | ORAL_TABLET | Freq: Every evening | ORAL | 1 refills | Status: DC | PRN

## 2019-03-27 MED ORDER — DULOXETINE HCL 20 MG PO CPEP: 20.0000 mg | ORAL_CAPSULE | Freq: Every day | ORAL | 1 refills | Status: DC

## 2019-03-27 MED ORDER — HYDROXYZINE HCL 25 MG PO TABS: 12.5000 mg | ORAL_TABLET | Freq: Three times a day (TID) | ORAL | 1 refills | Status: DC | PRN

## 2019-03-27 NOTE — Progress Notes (Signed)
Virtual Visit via Video Note  I connected with Kaylee Gordon on 03/27/19 at  4:45 PM EDT by a video enabled telemedicine application and verified that I am speaking with the correct person using two identifiers.   I discussed the limitations of evaluation and management by telemedicine and the availability of in person appointments. The patient expressed understanding and agreed to proceed.   I discussed the assessment and treatment plan with the patient. The patient was provided an opportunity to ask questions and all were answered. The patient agreed with the plan and demonstrated an understanding of the instructions.   The patient was advised to call back or seek an in-person evaluation if the symptoms worsen or if the condition fails to improve as anticipated.   Poyen MD OP Progress Note  03/27/2019 5:03 PM Kaylee Gordon  MRN:  [id number]  Chief Complaint:  Chief Complaint    Follow-up     HPI: Kaylee Gordon is a 58 year old Caucasian female who has a history of bipolar disorder, GAD, fibromyalgia, is married, retired, lives in [place], was evaluated by telemedicine today.  Patient today reports she struggled with falls, dizziness the past few weeks.  She however reports her provider recently took her off of antihypertensive medications.  She reports that did help her to feel better.  Since the past few days she has not had any dizzy spells or falls.  She reports mood as okay so far.  She continues to work with Ms. Miguel Dibble.  She reports that is going well.  Patient denies any suicidality, homicidality or perceptual disturbances.  She reports home situation continues to be stressful.  She however reports her daughter went back to school and that does help a lot since her son and her daughter does not get along.  Patient reports she has been using hydroxyzine as needed for anxiety symptoms.  She is not interested in any dosage changes at this time and wants to stay on her  medications.   Visit Diagnosis:    ICD-10-CM   1. Bipolar I disorder, most recent episode depressed (HCC)  F31.30 benztropine (COGENTIN) 0.5 MG tablet    QUEtiapine (SEROQUEL) 50 MG tablet  2. GAD (generalized anxiety disorder)  F41.1 DULoxetine (CYMBALTA) 20 MG capsule    DULoxetine (CYMBALTA) 60 MG capsule    hydrOXYzine (ATARAX/VISTARIL) 25 MG tablet    Past Psychiatric History: I have reviewed past psychiatric history from my progress note on 09/11/2018  Past Medical History:  Past Medical History:  Diagnosis Date  . Anxiety   . Asthma   . Cancer [facility])    SKIN CANCER  . CHF (congestive heart failure) ([place])   . Depression   . Hypertension   . Thyroid disease     Past Surgical History:  Procedure Laterality Date  . ANKLE SURGERY Right 2012  . APPENDECTOMY    . AUGMENTATION MAMMAPLASTY Bilateral   . BREAST ENHANCEMENT SURGERY    . COLONOSCOPY WITH PROPOFOL N/A 02/22/2015   Procedure: COLONOSCOPY WITH PROPOFOL;  Surgeon: Josefine Class, MD;  Location: [facility] ENDOSCOPY;  Service: Endoscopy;  Laterality: N/A;  . ESOPHAGOGASTRODUODENOSCOPY (EGD) WITH PROPOFOL N/A 02/22/2015   Procedure: ESOPHAGOGASTRODUODENOSCOPY (EGD) WITH PROPOFOL;  Surgeon: Josefine Class, MD;  Location: [facility] ENDOSCOPY;  Service: Endoscopy;  Laterality: N/A;  . ESOPHAGOGASTRODUODENOSCOPY (EGD) WITH PROPOFOL N/A 02/11/2016   Procedure: ESOPHAGOGASTRODUODENOSCOPY (EGD) WITH PROPOFOL;  Surgeon: Manya Silvas, MD;  Location[facility] ENDOSCOPY;  Service: Endoscopy;  Laterality: N/A;  . HEMORRHOID SURGERY    .  NASAL SINUS SURGERY    . SAVORY DILATION N/A 02/22/2015   Procedure: SAVORY DILATION;  Surgeon: Josefine Class, MD;  Location: [facility] ENDOSCOPY;  Service: Endoscopy;  Laterality: N/A;  . SAVORY DILATION N/A 02/11/2016   Procedure: SAVORY DILATION;  Surgeon: Manya Silvas, MD;  Location: [facility] ENDOSCOPY;  Service: Endoscopy;  Laterality: N/A;  . SKIN CANCER EXCISION      Family Psychiatric History: I  have reviewed family psychiatric history from my progress note on 09/11/2018  Family History:  Family History  Problem Relation Age of Onset  . Cancer Mother   . Depression Mother   . Anxiety disorder Mother   . Heart disease Father   . Cancer Father   . Anxiety disorder Father   . Depression Father   . Hypoparathyroidism Son     Social History: I have reviewed social history from my progress note on 09/11/2018 Social History   Socioeconomic History  . Marital status: Married    Spouse name: Legrand Como  . Number of children: Not on file  . Years of education: Not on file  . Highest education level: Not on file  Occupational History  . Not on file  Social Needs  . Financial resource strain: Not hard at all  . Food insecurity    Worry: Never true    Inability: Never true  . Transportation needs    Medical: No    Non-medical: No  Tobacco Use  . Smoking status: Never Smoker  . Smokeless tobacco: Never Used  Substance and Sexual Activity  . Alcohol use: No    Alcohol/week: 0.0 standard drinks    Comment: MAYBE ONCE OR TWICE A MONTH  . Drug use: No  . Sexual activity: Not Currently    Partners: Male  Lifestyle  . Physical activity    Days per week: 0 days    Minutes per session: Not on file  . Stress: Very much  Relationships  . Social Herbalist on phone: Twice a week    Gets together: Twice a week    Attends religious service: More than 4 times per year    Active member of club or organization: Not on file    Attends meetings of clubs or organizations: Not on file    Relationship status: Married  Other Topics Concern  . Not on file  Social History Narrative  . Not on file    Allergies:  Allergies  Allergen Reactions  . Atorvastatin   . Parafon Forte Dsc [Chlorzoxazone]   . Pollen Extract   . Sudafed [Pseudoephedrine Hcl]     Heart racing    Metabolic Disorder Labs: No results found for: HGBA1C, MPG Lab Results  Component Value Date    PROLACTIN 18.3 07/22/2014   No results found for: CHOL, TRIG, HDL, CHOLHDL, VLDL, LDLCALC Lab Results  Component Value Date   TSH 1.76 11/15/2018   TSH 1.68 04/03/2018    Therapeutic Level Labs: No results found for: LITHIUM Lab Results  Component Value Date   VALPROATE 39 (L) 02/04/2019   No components found for:  CBMZ  Current Medications: Current Outpatient Medications  Medication Sig Dispense Refill  . azelastine (ASTELIN) 0.1 % nasal spray Place into the nose.    . benztropine (COGENTIN) 0.5 MG tablet Take 1 tablet (0.5 mg total) by mouth daily as needed for tremors. For muscle spasms 30 tablet 1  . calcitRIOL (ROCALTROL) 0.25 MCG capsule TAKE ONE CAPSULE BY MOUTH DAILY 30  capsule 1  . divalproex (DEPAKOTE ER) 500 MG 24 hr tablet Take 2 tablets (1,000 mg total) by mouth daily. 180 tablet 1  . DULoxetine (CYMBALTA) 20 MG capsule Take 1 capsule (20 mg total) by mouth daily. To be combined with 60 mg 30 capsule 1  . DULoxetine (CYMBALTA) 60 MG capsule Take 1 capsule (60 mg total) by mouth daily. To be combined with 20 mg 30 capsule 1  . ferrous sulfate 325 (65 FE) MG tablet Take by mouth.    . fluticasone (FLONASE) 50 MCG/ACT nasal spray     . gabapentin (NEURONTIN) 300 MG capsule TAKE TWO CAPSULES BY MOUTH EVERY MORNING; THEN ONE CAPSULE BY MOUTH MIDDAY; THEN TAKE TWO CAPSULES BY MOUTH EVERY EVENING    . guaiFENesin-codeine 100-10 MG/5ML syrup Take by mouth.    . hydrOXYzine (ATARAX/VISTARIL) 25 MG tablet Take 0.5-1 tablets (12.5-25 mg total) by mouth 3 (three) times daily as needed for anxiety. For severe anxiety and sleep 90 tablet 1  . hyoscyamine (LEVSIN, ANASPAZ) 0.125 MG tablet Take by mouth.    . levothyroxine (SYNTHROID) 50 MCG tablet TAKE 1.5 TABLETS BY MOUTH DAILY 45 tablet 1  . lovastatin (MEVACOR) 20 MG tablet Take 20 mg by mouth at bedtime.    . pantoprazole (PROTONIX) 40 MG tablet Take 1 tablet (40 mg total) by mouth daily. 30 tablet 1  . propranolol (INDERAL) 20  MG tablet Take 20 mg by mouth 2 (two) times daily.     . QUEtiapine (SEROQUEL) 100 MG tablet Take 1 tablet (100 mg total) by mouth at bedtime. 30 tablet 1  . QUEtiapine (SEROQUEL) 50 MG tablet Take 1 tablet (50 mg total) by mouth at bedtime as needed. For mood and sleep 30 tablet 1  . vitamin B-12 (CYANOCOBALAMIN) 1000 MCG tablet Take by mouth.     No current facility-administered medications for this visit.      Musculoskeletal: Strength & Muscle Tone: UTA Gait & Station: normal Patient leans: N/A  Psychiatric Specialty Exam: Review of Systems  Psychiatric/Behavioral: The patient is nervous/anxious.   All other systems reviewed and are negative.   There were no vitals taken for this visit.There is no height or weight on file to calculate BMI.  General Appearance: Casual  Eye Contact:  Fair  Speech:  Clear and Coherent  Volume:  Normal  Mood:  Anxious improving  Affect:  Congruent  Thought Process:  Goal Directed and Descriptions of Associations: Intact  Orientation:  Full (Time, Place, and Person)  Thought Content: Logical   Suicidal Thoughts:  No  Homicidal Thoughts:  No  Memory:  Immediate;   Fair Recent;   Fair Remote;   Fair  Judgement:  Fair  Insight:  Fair  Psychomotor Activity:  Normal  Concentration:  Concentration: Fair and Attention Span: Fair  Recall:  [employer] of Knowledge: Fair  Language: Fair  Akathisia:  No  Handed:  Right  AIMS (if indicated):Denies tremors, rigidity  Assets:  Communication Skills Desire for Improvement Social Support  ADL's:  Intact  Cognition: WNL  Sleep:  Fair   Screenings:   Assessment and Plan: Briyona is a 58 year old Caucasian female, married, lives in [place], has a history of bipolar disorder, anxiety disorder, fibromyalgia, IBS was evaluated by telemedicine today.  Patient today reports she is currently making progress on the current medication regimen.  She had recent medication changes which has helped her with  her dizziness and falls.  Continue plan as noted below.  Plan  Bipolar disorder-improving Depakote ER 1000 mg p.o. daily with supper Seroquel 150 mg p.o. nightly Hydroxyzine 25 mg p.o. 3 times daily as needed  GAD- improving Cymbalta 80 mg p.o. daily Seroquel as prescribed . She will continue to work with therapist  I have reviewed medical records per Dr.Kumar:-Endocrinologist dated 03/07/2019- patient with history of hypercalcemia since the age of 10.  She did have complaints of twitching in her face, muscle cramps in her hands in the past.  She is taking calcitriol 0.25 mcg daily and is also on calcium tablet.  Calcium level was normal recently.  Her muscle cramps may have improved since Seroquel dosage was reduced and Cogentin was added.  Follow-up in clinic in 6 weeks or sooner if needed.  October 22 at 4:30 PM  I have spent atleast 15 minutes non  face to face with patient today. More than 50 % of the time was spent for psychoeducation and supportive psychotherapy and care coordination. This note was generated in part or whole with voice recognition software. Voice recognition is usually quite accurate but there are transcription errors that can and very often do occur. I apologize for any typographical errors that were not detected and corrected.       Ursula Alert, MD 03/27/2019, 5:03 PM

## 2019-04-03 ENCOUNTER — Other Ambulatory Visit: Payer: Self-pay | Admitting: Endocrinology

## 2019-04-12 IMAGING — RF DG ESOPHAGUS
8 of 9 series · 14 of 16 positions shown · non-contrast
Comparison: None.

CLINICAL DATA: Possible aspiration. History of hiatal hernia and to
esophageal dilation procedures, the most recent in February 2018.

EXAM:
ESOPHOGRAM / BARIUM SWALLOW / BARIUM TABLET STUDY
TECHNIQUE: Combined double contrast and single contrast examination performed
using effervescent crystals, thick barium liquid, and thin barium
liquid. The patient was observed with fluoroscopy swallowing a 13 mm
barium sulphate tablet.
FLUOROSCOPY TIME:  Fluoroscopy Time:  1 minutes, 18 seconds
Radiation Exposure Index (if provided by the fluoroscopic device):
33.4 mGy
Number of Acquired Spot Images: 7+ 1 screen save and 1 video loop.

[Series 1: fluoro_barium 2fps_bw · 0.17mm/px · 1 of 1 slices shown (1 of 7)]
[im 1/1]
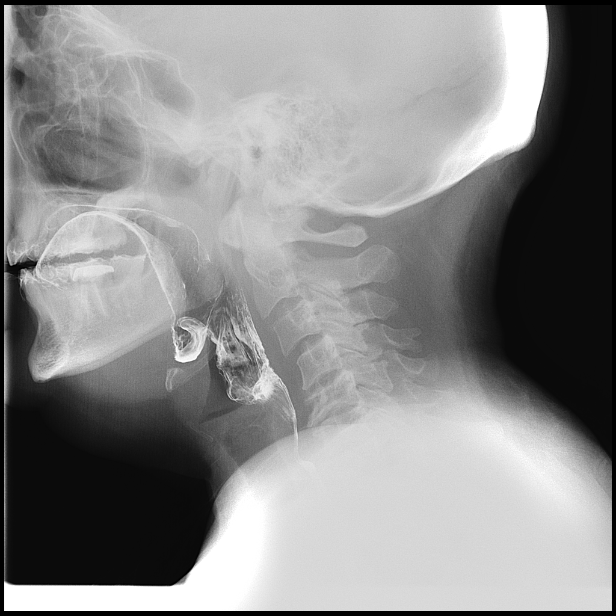

[Series 2: fluoro_barium 2fps_bw · 0.18mm/px · 1 of 1 slices shown (2 of 7)]
[im 1/1]
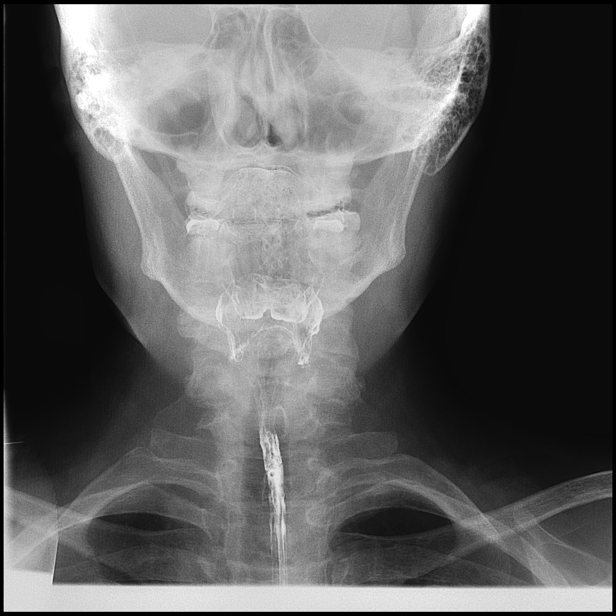

[Series 3: fluoro_barium 2fps_bw · 0.18mm/px · 1 of 1 slices shown (3 of 7)]
[im 1/1]
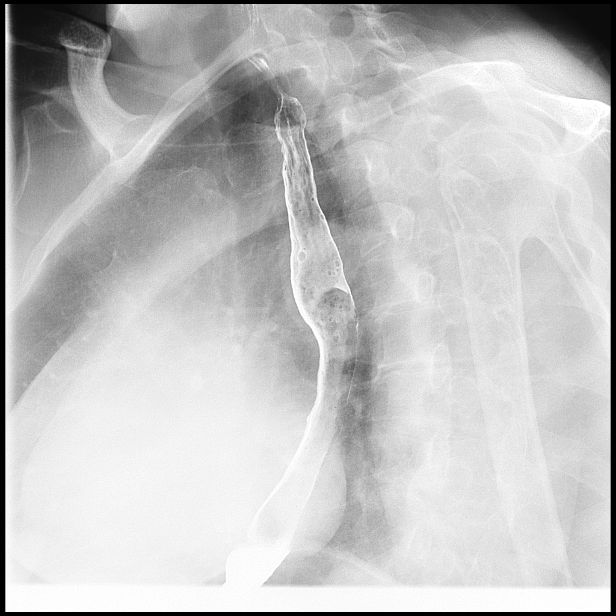

[Series 5: fluoro_barium 2fps_bw · 0.18mm/px · 1 of 1 slices shown (4 of 7)]
[im 1/1]
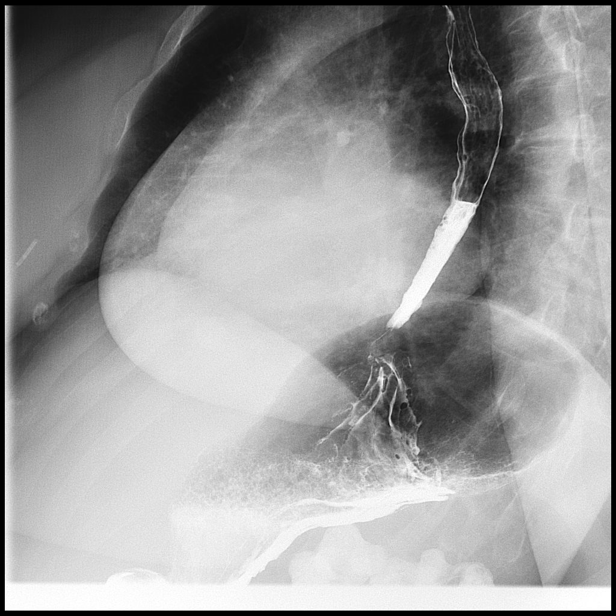

[Series 6: fluoro_barium 2fps_bw · 0.18mm/px · 1 of 1 slices shown (5 of 7)]
[im 1/1]
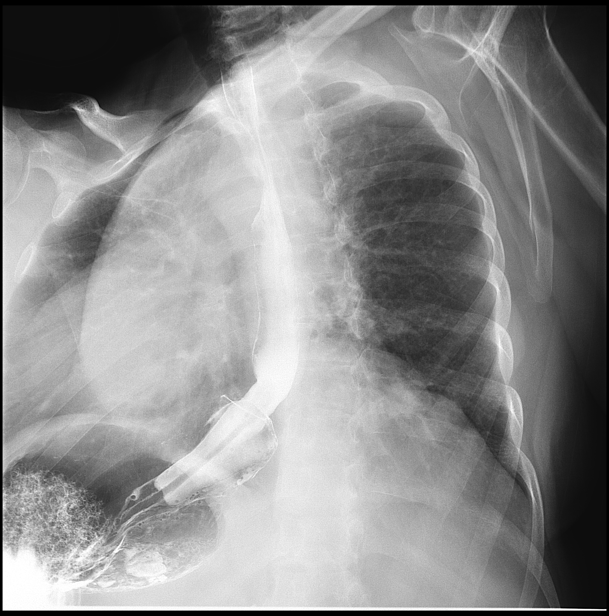

[Series 7: fluoro_barium 2fps_bw · 0.18mm/px · 4 of 14 frames shown (6 of 7)]
[frame 3/14]
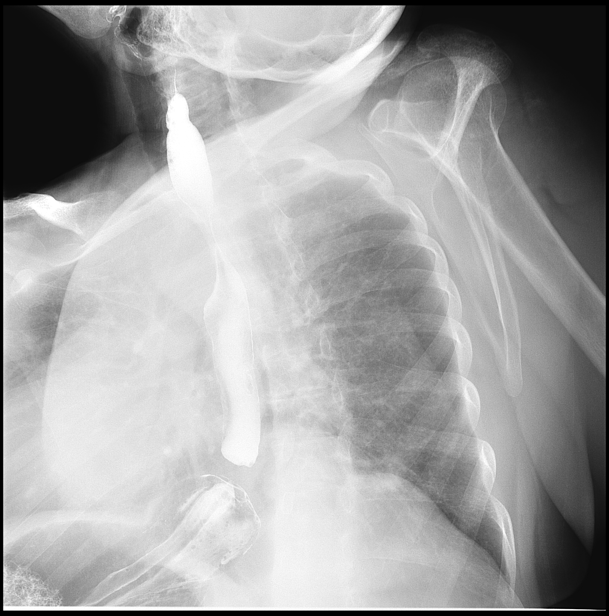
[frame 8/14]
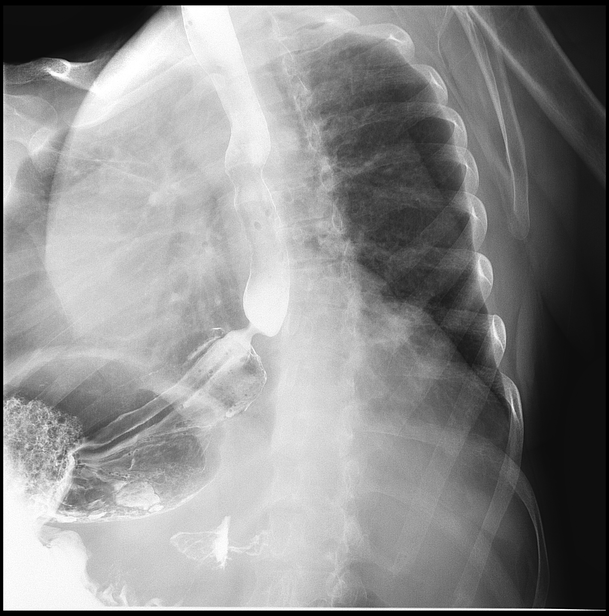
[frame 12/14]
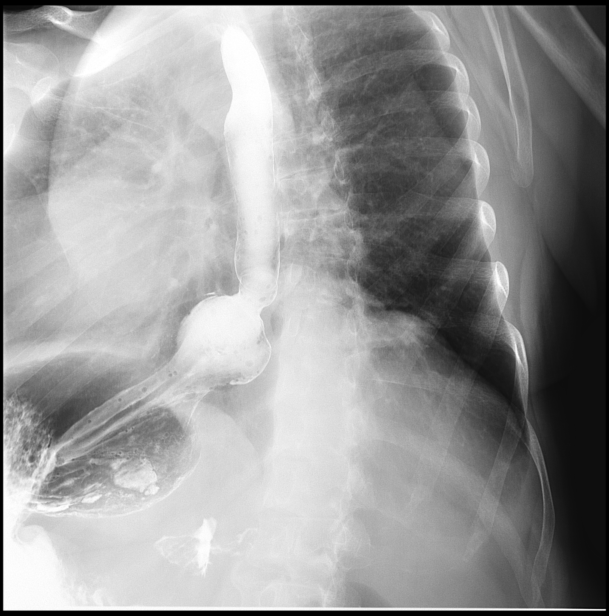
[frame 13/14]
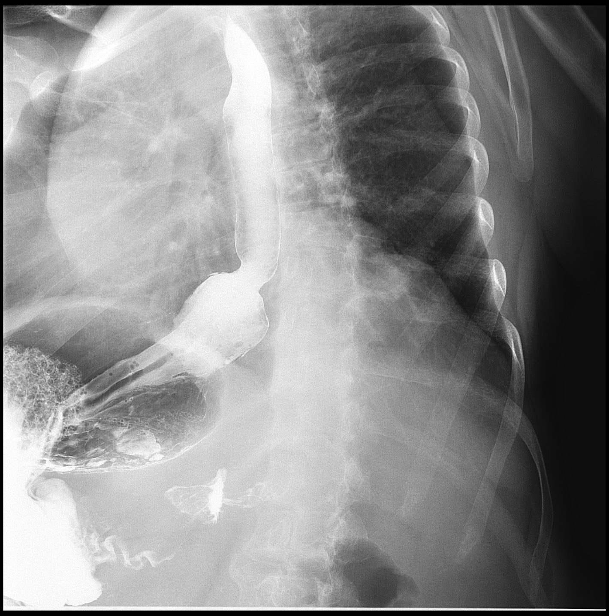

[Series 8: fluoro_barium 2fps_bw · 0.18mm/px · 1 of 2 frames shown (7 of 7)]
[frame 1/2]
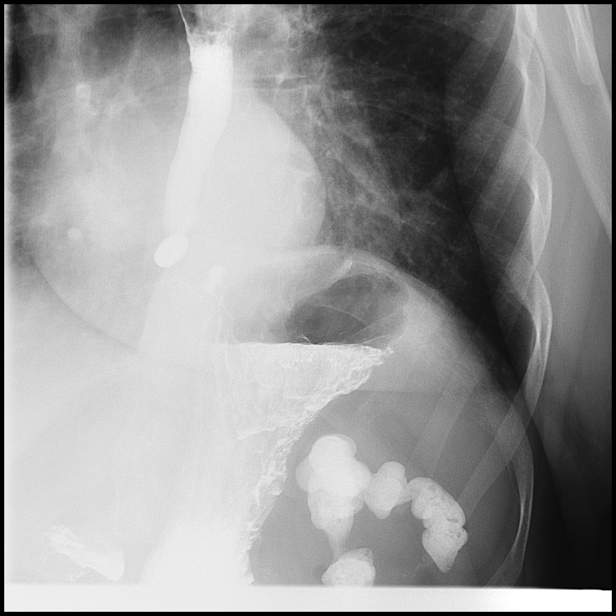

[Series 9: cp_standard · 0.18mm/px · 4 of 81 frames shown]
[frame 13/81]
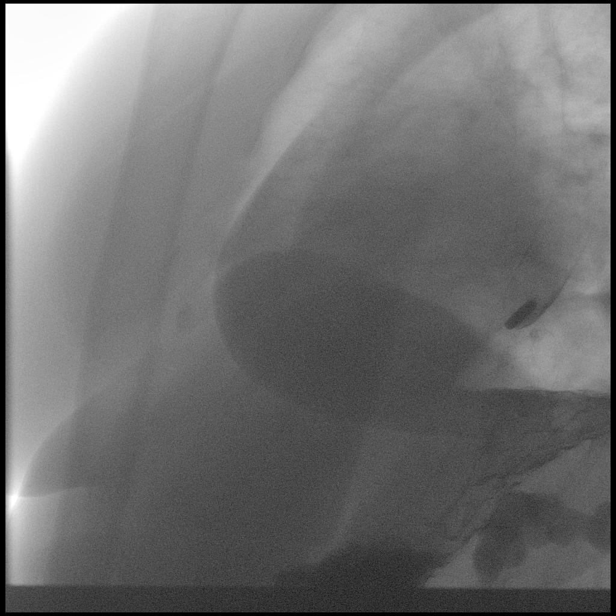
[frame 41/81]
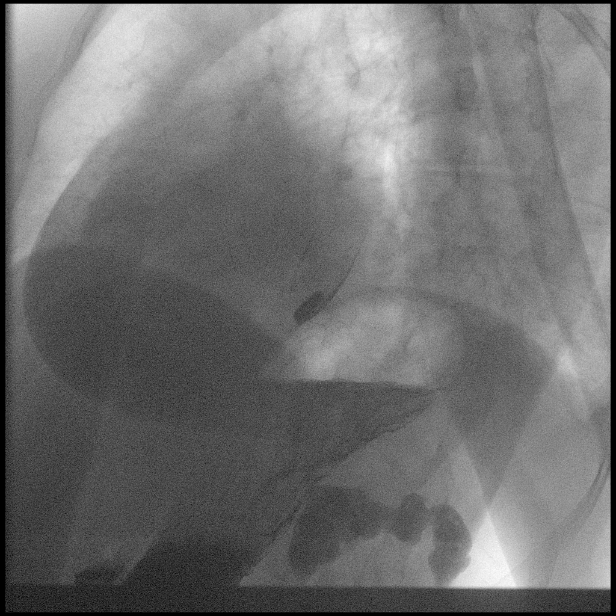
[frame 69/81]
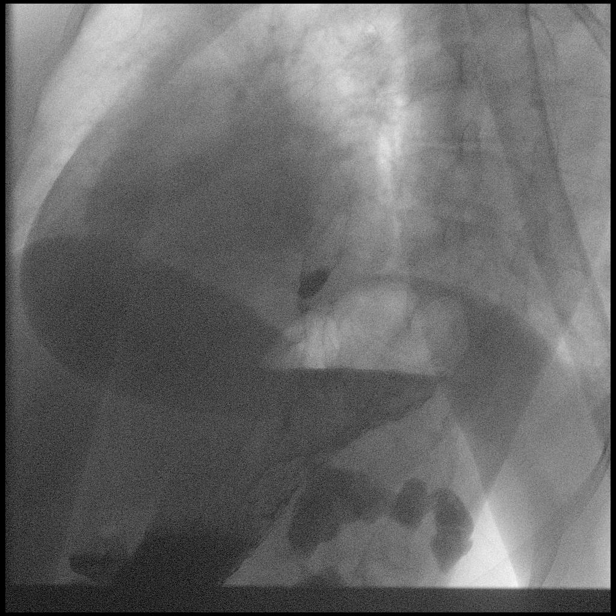
[frame 81/81]
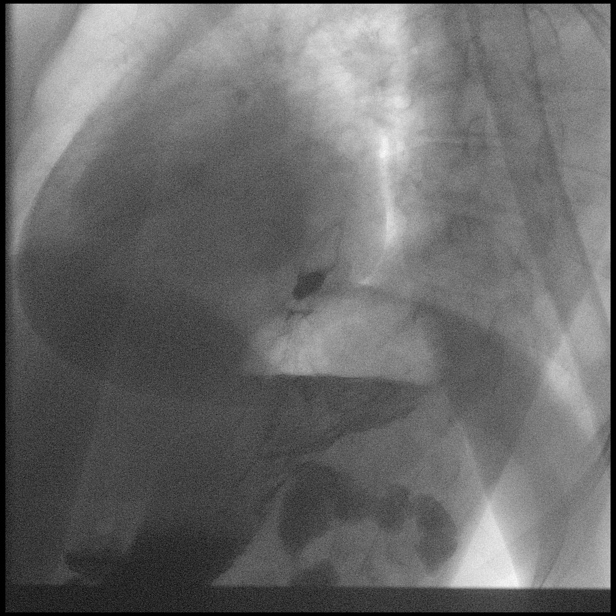

[14 of 16 positions shown; findings below may reference images not displayed]

FINDINGS: The patient ingested thick and thin barium and the gas-forming
crystals without difficulty. The hypopharynx distended well. There
was no laryngeal penetration of the barium. Degenerative endplate
osteophytes at C5-6 produce a mild posterior impression upon the
proximal cervical esophagus. It distends reasonably well however.
The thoracic esophagus distends well. There was a moderate-sized non
reducible hiatal hernia. There is mild narrowing at the GE junction.
At this site the barium pill hung and would not pass and was allowed
to dissolve. In the prone position esophageal motility was
preserved. No reflux was demonstrated.
IMPRESSION: Normal appearance of the hypopharynx. No laryngeal penetration of
the barium.

Mild esophageal luminal narrowing at the GE junction which would not
allow passage of the barium tablet. This is just proximal to a
moderate-sized non reducible hiatal hernia. No evidence of
esophagitis or gastroesophageal reflux.

Consideration for endoscopy and possible repeat dilation would be
useful.

## 2019-05-08 ENCOUNTER — Ambulatory Visit (INDEPENDENT_AMBULATORY_CARE_PROVIDER_SITE_OTHER): Payer: BC Managed Care – PPO | Admitting: Psychiatry

## 2019-05-08 ENCOUNTER — Encounter: Payer: Self-pay | Admitting: Psychiatry

## 2019-05-08 ENCOUNTER — Other Ambulatory Visit: Payer: Self-pay

## 2019-05-08 DIAGNOSIS — R251 Tremor, unspecified: Secondary | ICD-10-CM | POA: Diagnosis not present

## 2019-05-08 DIAGNOSIS — F3130 Bipolar disorder, current episode depressed, mild or moderate severity, unspecified: Secondary | ICD-10-CM | POA: Diagnosis not present

## 2019-05-08 DIAGNOSIS — F411 Generalized anxiety disorder: Secondary | ICD-10-CM

## 2019-05-08 MED ORDER — QUETIAPINE FUMARATE 50 MG PO TABS: 50.0000 mg | ORAL_TABLET | Freq: Every evening | ORAL | 1 refills | Status: DC | PRN

## 2019-05-08 MED ORDER — DULOXETINE HCL 20 MG PO CPEP: 20.0000 mg | ORAL_CAPSULE | Freq: Every day | ORAL | 1 refills | Status: DC

## 2019-05-08 MED ORDER — QUETIAPINE FUMARATE 100 MG PO TABS: 100.0000 mg | ORAL_TABLET | Freq: Every day | ORAL | 1 refills | Status: DC

## 2019-05-08 MED ORDER — DULOXETINE HCL 60 MG PO CPEP: 60.0000 mg | ORAL_CAPSULE | Freq: Every day | ORAL | 1 refills | Status: DC

## 2019-05-08 MED ORDER — HYDROXYZINE PAMOATE 50 MG PO CAPS: 50.0000 mg | ORAL_CAPSULE | Freq: Two times a day (BID) | ORAL | 1 refills | Status: DC | PRN

## 2019-05-08 NOTE — Progress Notes (Signed)
Virtual Visit via Video Note  I connected with Kaylee Gordon on 05/08/19 at  4:30 PM EDT by a video enabled telemedicine application and verified that I am speaking with the correct person using two identifiers.   I discussed the limitations of evaluation and management by telemedicine and the availability of in person appointments. The patient expressed understanding and agreed to proceed.   I discussed the assessment and treatment plan with the patient. The patient was provided an opportunity to ask questions and all were answered. The patient agreed with the plan and demonstrated an understanding of the instructions.   The patient was advised to call back or seek an in-person evaluation if the symptoms worsen or if the condition fails to improve as anticipated.   [place] MD OP Progress Note  05/08/2019 4:58 PM Kaylee Gordon  MRN:  [id number]  Chief Complaint:  Chief Complaint    Follow-up     HPI: Kaylee Gordon is a 58 year old Caucasian female who has a history of bipolar disorder, GAD, fibromyalgia is married, lives in [place], was evaluated by telemedicine today.  Patient reports she continues to struggle with anxiety and sadness on and off.  She reports the past couple of days she has been feeling better than before.  She however continues to struggle on and off.  She is currently struggling with some back pain which is making her anxiety worse.  She however reports she is currently making use of her pain medications as well as using a heat to help her pain.  She reports sleep is good.  Seroquel does help with the same.  She reports she was not able to get a Depakote level done since she may have forgotten the lab slip somewhere.  She however reports she has noticed that she has worsening tremors since the past few weeks.  She reports she may have had mild tremors in the past however it was nothing like this.  She reports this may be getting worse and she is not sure what could be  causing it.  Discussed with patient that Depakote does cause tremors and we could stop the medication and she could monitor herself.  Patient continues to work with her therapist Ms. Miguel Dibble and reports therapy sessions is helpful.  Patient denies any suicidality, homicidality or perceptual disturbances. Visit Diagnosis:    ICD-10-CM   1. Bipolar I disorder, most recent episode depressed (HCC) Active F31.30 QUEtiapine (SEROQUEL) 100 MG tablet    QUEtiapine (SEROQUEL) 50 MG tablet  2. GAD (generalized anxiety disorder) Active F41.1 hydrOXYzine (VISTARIL) 50 MG capsule    DULoxetine (CYMBALTA) 60 MG capsule    DULoxetine (CYMBALTA) 20 MG capsule  3. Tremor Active R25.1     Past Psychiatric History: I have reviewed past psychiatric history from my progress note on 09/11/2018.  Past Medical History:  Past Medical History:  Diagnosis Date  . Anxiety   . Asthma   . Cancer [facility])    SKIN CANCER  . CHF (congestive heart failure) ([place])   . Depression   . Hypertension   . Thyroid disease     Past Surgical History:  Procedure Laterality Date  . ANKLE SURGERY Right 2012  . APPENDECTOMY    . AUGMENTATION MAMMAPLASTY Bilateral   . BREAST ENHANCEMENT SURGERY    . COLONOSCOPY WITH PROPOFOL N/A 02/22/2015   Procedure: COLONOSCOPY WITH PROPOFOL;  Surgeon: Josefine Class, MD;  Location: [facility] ENDOSCOPY;  Service: Endoscopy;  Laterality: N/A;  . ESOPHAGOGASTRODUODENOSCOPY (EGD)  WITH PROPOFOL N/A 02/22/2015   Procedure: ESOPHAGOGASTRODUODENOSCOPY (EGD) WITH PROPOFOL;  Surgeon: Josefine Class, MD;  Location: [facility] ENDOSCOPY;  Service: Endoscopy;  Laterality: N/A;  . ESOPHAGOGASTRODUODENOSCOPY (EGD) WITH PROPOFOL N/A 02/11/2016   Procedure: ESOPHAGOGASTRODUODENOSCOPY (EGD) WITH PROPOFOL;  Surgeon: Manya Silvas, MD;  Location: [facility] ENDOSCOPY;  Service: Endoscopy;  Laterality: N/A;  . HEMORRHOID SURGERY    . NASAL SINUS SURGERY    . SAVORY DILATION N/A 02/22/2015   Procedure: SAVORY  DILATION;  Surgeon: Josefine Class, MD;  Location: [facility] ENDOSCOPY;  Service: Endoscopy;  Laterality: N/A;  . SAVORY DILATION N/A 02/11/2016   Procedure: SAVORY DILATION;  Surgeon: Manya Silvas, MD;  Location: [facility] ENDOSCOPY;  Service: Endoscopy;  Laterality: N/A;  . SKIN CANCER EXCISION      Family Psychiatric History: I have reviewed family psychiatric history from my progress note on 09/11/2018.  Family History:  Family History  Problem Relation Age of Onset  . Cancer Mother   . Depression Mother   . Anxiety disorder Mother   . Heart disease Father   . Cancer Father   . Anxiety disorder Father   . Depression Father   . Hypoparathyroidism Son     Social History: I have reviewed social history from my progress note on 09/11/2018. Social History   Socioeconomic History  . Marital status: Married    Spouse name: Legrand Como  . Number of children: Not on file  . Years of education: Not on file  . Highest education level: Not on file  Occupational History  . Not on file  Social Needs  . Financial resource strain: Not hard at all  . Food insecurity    Worry: Never true    Inability: Never true  . Transportation needs    Medical: No    Non-medical: No  Tobacco Use  . Smoking status: Never Smoker  . Smokeless tobacco: Never Used  Substance and Sexual Activity  . Alcohol use: No    Alcohol/week: 0.0 standard drinks    Comment: MAYBE ONCE OR TWICE A MONTH  . Drug use: No  . Sexual activity: Not Currently    Partners: Male  Lifestyle  . Physical activity    Days per week: 0 days    Minutes per session: Not on file  . Stress: Very much  Relationships  . Social Herbalist on phone: Twice a week    Gets together: Twice a week    Attends religious service: More than 4 times per year    Active member of club or organization: Not on file    Attends meetings of clubs or organizations: Not on file    Relationship status: Married  Other Topics Concern  . Not  on file  Social History Narrative  . Not on file    Allergies:  Allergies  Allergen Reactions  . Atorvastatin   . Hydrochlorothiazide Other (See Comments)    Intolerance - dry lips  . Parafon Forte Dsc [Chlorzoxazone]   . Pollen Extract   . Sudafed [Pseudoephedrine Hcl]     Heart racing    Metabolic Disorder Labs: No results found for: HGBA1C, MPG Lab Results  Component Value Date   PROLACTIN 18.3 07/22/2014   No results found for: CHOL, TRIG, HDL, CHOLHDL, VLDL, LDLCALC Lab Results  Component Value Date   TSH 1.76 11/15/2018   TSH 1.68 04/03/2018    Therapeutic Level Labs: No results found for: LITHIUM Lab Results  Component Value Date  VALPROATE 39 (L) 02/04/2019   No components found for:  CBMZ  Current Medications: Current Outpatient Medications  Medication Sig Dispense Refill  . azelastine (ASTELIN) 0.1 % nasal spray Place into the nose.    . benztropine (COGENTIN) 0.5 MG tablet Take 1 tablet (0.5 mg total) by mouth daily as needed for tremors. For muscle spasms 30 tablet 1  . calcitRIOL (ROCALTROL) 0.25 MCG capsule TAKE ONE CAPSULE BY MOUTH DAILY 30 capsule 2  . DULoxetine (CYMBALTA) 20 MG capsule Take 1 capsule (20 mg total) by mouth daily. To be combined with 60 mg 30 capsule 1  . DULoxetine (CYMBALTA) 60 MG capsule Take 1 capsule (60 mg total) by mouth daily. To be combined with 20 mg 30 capsule 1  . ferrous sulfate 325 (65 FE) MG tablet Take by mouth.    . fluticasone (FLONASE) 50 MCG/ACT nasal spray     . gabapentin (NEURONTIN) 300 MG capsule TAKE TWO CAPSULES BY MOUTH EVERY MORNING; THEN ONE CAPSULE BY MOUTH MIDDAY; THEN TAKE TWO CAPSULES BY MOUTH EVERY EVENING    . guaiFENesin-codeine 100-10 MG/5ML syrup Take by mouth.    . hydrOXYzine (VISTARIL) 50 MG capsule Take 1 capsule (50 mg total) by mouth 2 (two) times daily as needed for anxiety. 60 capsule 1  . hyoscyamine (LEVSIN, ANASPAZ) 0.125 MG tablet Take by mouth.    . levothyroxine (SYNTHROID) 50  MCG tablet TAKE 1.5 TABLETS BY MOUTH DAILY 45 tablet 1  . lovastatin (MEVACOR) 20 MG tablet Take 20 mg by mouth at bedtime.    . pantoprazole (PROTONIX) 40 MG tablet Take 1 tablet (40 mg total) by mouth daily. 30 tablet 1  . propranolol (INDERAL) 20 MG tablet Take 20 mg by mouth 2 (two) times daily.     . QUEtiapine (SEROQUEL) 100 MG tablet Take 1 tablet (100 mg total) by mouth at bedtime. To be combined with 50 mg 30 tablet 1  . QUEtiapine (SEROQUEL) 50 MG tablet Take 1 tablet (50 mg total) by mouth at bedtime as needed. For mood and sleep 30 tablet 1  . vitamin B-12 (CYANOCOBALAMIN) 1000 MCG tablet Take by mouth.     No current facility-administered medications for this visit.      Musculoskeletal: Strength & Muscle Tone: UTA Gait & Station: normal Patient leans: N/A  Psychiatric Specialty Exam: Review of Systems  Neurological: Positive for tremors.  Psychiatric/Behavioral: The patient is nervous/anxious.   All other systems reviewed and are negative.   There were no vitals taken for this visit.There is no height or weight on file to calculate BMI.  General Appearance: Casual  Eye Contact:  Fair  Speech:  Clear and Coherent  Volume:  Normal  Mood:  Anxious  Affect:  Congruent  Thought Process:  Goal Directed and Descriptions of Associations: Intact  Orientation:  Full (Time, Place, and Person)  Thought Content: Logical   Suicidal Thoughts:  No  Homicidal Thoughts:  No  Memory:  Immediate;   Fair Recent;   Fair Remote;   Fair  Judgement:  Fair  Insight:  Fair  Psychomotor Activity:  NA, Normal and Tremor  Concentration:  Concentration: Fair and Attention Span: Fair  Recall:  [employer] of Knowledge: Fair  Language: Fair  Akathisia:  No  Handed:  Right  AIMS (if indicated): Reports tremors of BL hands , worsening since past few weeks.  Assets:  Communication Skills Desire for Improvement Housing Social Support  ADL's:  Intact  Cognition: WNL  Sleep:  Fair    Screenings:   Assessment and Plan: Cait is a 58 year old Caucasian female, married, lives in [place], has a history of bipolar disorder, anxiety disorder, fibromyalgia, IBS was evaluated by telemedicine today.  Patient today reports she is currently struggling with anxiety more so because of her pain.  She also has tremors of bilateral upper extremities likely due to her medications.  We will make medication readjustments today.  Plan as noted below.  Plan Bipolar disorder-some progress Discontinue Depakote for side effects of tremors. Continue Seroquel 150 mg p.o. nightly Increase hydroxyzine to 50 mg p.o. twice daily as needed for anxiety  GAD-some progress Cymbalta 80 mg p.o. daily Seroquel as prescribed Patient will continue to work with her therapist Ms. Miguel Dibble.  Tremors- unstable Unknown if her tremors are medication induced.  She reports she has been taking Seroquel for a long time and she never had tremors due to Seroquel before.  She reports it started after starting Depakote.  Will discontinue Depakote.  Will monitor symptoms closely.  Follow-up in clinic in 2 weeks or sooner if needed.  November 9 at 2:15 PM  I have spent atleast 15 minutes non  face to face with patient today. More than 50 % of the time was spent for psychoeducation and supportive psychotherapy and care coordination. This note was generated in part or whole with voice recognition software. Voice recognition is usually quite accurate but there are transcription errors that can and very often do occur. I apologize for any typographical errors that were not detected and corrected.       Ursula Alert, MD 05/08/2019, 4:58 PM

## 2019-05-20 ENCOUNTER — Other Ambulatory Visit: Payer: Self-pay

## 2019-05-22 ENCOUNTER — Encounter: Payer: Self-pay | Admitting: Endocrinology

## 2019-05-22 ENCOUNTER — Ambulatory Visit: Payer: BC Managed Care – PPO | Admitting: Endocrinology

## 2019-05-22 VITALS — BP 130/80 | HR 87 | Ht 64.0 in | Wt 173.0 lb

## 2019-05-22 DIAGNOSIS — E200 Idiopathic hypoparathyroidism: Secondary | ICD-10-CM | POA: Diagnosis not present

## 2019-05-22 DIAGNOSIS — E038 Other specified hypothyroidism: Secondary | ICD-10-CM | POA: Diagnosis not present

## 2019-05-22 NOTE — Progress Notes (Signed)
Patient ID: Kaylee Gordon, female   DOB: [birth year], 58 y.o.   MRN: [id number]    History of Present Illness:  Chief complaint: Shakiness    Hypothyroidism:  She was initially given thyroxine supplement with a borderline TSH levels in 2011 However because of complaints of  fatigue on her visit in 10/15 she had thyroid levels done and free T4 was significantly low She was empirically started on 25 g  of levothyroxine Initially she felt less tired with this; however her free T4 continue to be low and her dose has been increased to 75 g since 1/16 when her free T4 was still low at 0.59 She had a low TSH in 11/16 and since then has been taking 50 mcg daily, this was changed by the PCP   In 10/2017 she was found to have a low free T4 and was told to take 75 mcg of levothyroxine However for some reason she did not change her dose and is continuing to take 50 mcg  On her follow-up in 9/19 Free T4 was however normal on the same dose and has been normal subsequently  She feels fatigued only on some days when she is having more problems with her fibromyalgia otherwise feels normal energy level She has lost a little weight, has decreased appetite No cold intolerance, sometimes may feel warm  She is taking her levothyroxine daily in the morning consistently. She takes her calcium in the evening after dinner   Wt Readings from Last 3 Encounters:  05/22/19 173 lb (78.5 kg)  12/12/18 180 lb 9.6 oz (81.9 kg)  11/15/18 177 lb 12.8 oz (80.6 kg)    Lab Results  Component Value Date   FREET4 0.78 11/15/2018   FREET4 0.91 04/03/2018   FREET4 0.59 (L) 10/29/2017   TSH 1.76 11/15/2018   TSH 1.68 04/03/2018   TSH 3.64 10/29/2017    Hypoparathyroidism    Has had hypocalcemia since she was 58 years old and presented with symptoms of cramping in her hands along with twitching of her face and eyes. 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   Not complaining of of twitching or tingling in her face, muscle cramping in her hands. She was having some of the symptoms on her last visit in August but no etiology found, possibly from HCTZ   She is taking calcitriol 0.25 mcg daily in the morning and also her calcium tablet which has 1200 mg once daily after dinner She takes these very regularly  Last calcium normal in August  Lab Results  Component Value Date   CALCIUM 9.4 03/05/2019   CALCIUM 8.6 11/15/2018   CALCIUM 9.3 04/03/2018   CALCIUM 9.4 10/29/2017   CALCIUM 9.3 12/19/2016   Lab Results  Component Value Date   K 3.6 03/05/2019       Allergies as of 05/22/2019      Reactions   Atorvastatin    Hydrochlorothiazide Other (See Comments)   Intolerance - dry lips   Parafon Forte Dsc [chlorzoxazone]    Pollen Extract    Sudafed [pseudoephedrine Hcl]    Heart racing      Medication List       Accurate as of May 22, 2019  3:21 PM. If you have any questions, ask your nurse or doctor.        azelastine 0.1 % nasal spray Commonly known as: ASTELIN Place into the nose.  benztropine 0.5 MG tablet Commonly known as: COGENTIN Take 1 tablet (0.5 mg total) by mouth daily as needed for tremors. For muscle spasms   calcitRIOL 0.25 MCG capsule Commonly known as: ROCALTROL TAKE ONE CAPSULE BY MOUTH DAILY   DULoxetine 60 MG capsule Commonly known as: Cymbalta Take 1 capsule (60 mg total) by mouth daily. To be combined with 20 mg   DULoxetine 20 MG capsule Commonly known as: CYMBALTA Take 1 capsule (20 mg total) by mouth daily. To be combined with 60 mg   ferrous sulfate 325 (65 FE) MG tablet Take by mouth.   fluticasone 50 MCG/ACT nasal spray Commonly known as: FLONASE   gabapentin 300 MG capsule Commonly known as: NEURONTIN TAKE TWO CAPSULES BY MOUTH EVERY MORNING; THEN ONE CAPSULE BY MOUTH MIDDAY; THEN TAKE TWO CAPSULES BY MOUTH EVERY EVENING   guaiFENesin-codeine 100-10  MG/5ML syrup Take by mouth.   hydrOXYzine 50 MG capsule Commonly known as: Vistaril Take 1 capsule (50 mg total) by mouth 2 (two) times daily as needed for anxiety.   hyoscyamine 0.125 MG tablet Commonly known as: LEVSIN Take by mouth.   levothyroxine 50 MCG tablet Commonly known as: SYNTHROID TAKE 1.5 TABLETS BY MOUTH DAILY   lovastatin 20 MG tablet Commonly known as: MEVACOR Take 20 mg by mouth at bedtime.   pantoprazole 40 MG tablet Commonly known as: PROTONIX Take 1 tablet (40 mg total) by mouth daily.   propranolol 20 MG tablet Commonly known as: INDERAL Take 20 mg by mouth 2 (two) times daily.   QUEtiapine 100 MG tablet Commonly known as: SEROquel Take 1 tablet (100 mg total) by mouth at bedtime. To be combined with 50 mg   QUEtiapine 50 MG tablet Commonly known as: SEROquel Take 1 tablet (50 mg total) by mouth at bedtime as needed. For mood and sleep   vitamin B-12 1000 MCG tablet Commonly known as: CYANOCOBALAMIN Take by mouth.       Allergies:  Allergies  Allergen Reactions  . Atorvastatin   . Hydrochlorothiazide Other (See Comments)    Intolerance - dry lips  . Parafon Forte Dsc [Chlorzoxazone]   . Pollen Extract   . Sudafed [Pseudoephedrine Hcl]     Heart racing    Past Medical History:  Diagnosis Date  . Anxiety   . Asthma   . Cancer [facility])    SKIN CANCER  . CHF (congestive heart failure) ([place])   . Depression   . Hypertension   . Thyroid disease     Past Surgical History:  Procedure Laterality Date  . ANKLE SURGERY Right 2012  . APPENDECTOMY    . AUGMENTATION MAMMAPLASTY Bilateral   . BREAST ENHANCEMENT SURGERY    . COLONOSCOPY WITH PROPOFOL N/A 02/22/2015   Procedure: COLONOSCOPY WITH PROPOFOL;  Surgeon: Josefine Class, MD;  Location: [facility] ENDOSCOPY;  Service: Endoscopy;  Laterality: N/A;  . ESOPHAGOGASTRODUODENOSCOPY (EGD) WITH PROPOFOL N/A 02/22/2015   Procedure: ESOPHAGOGASTRODUODENOSCOPY (EGD) WITH PROPOFOL;  Surgeon: Josefine Class, MD;  Location: [facility] ENDOSCOPY;  Service: Endoscopy;  Laterality: N/A;  . ESOPHAGOGASTRODUODENOSCOPY (EGD) WITH PROPOFOL N/A 02/11/2016   Procedure: ESOPHAGOGASTRODUODENOSCOPY (EGD) WITH PROPOFOL;  Surgeon: Manya Silvas, MD;  Location: [facility] ENDOSCOPY;  Service: Endoscopy;  Laterality: N/A;  . HEMORRHOID SURGERY    . NASAL SINUS SURGERY    . SAVORY DILATION N/A 02/22/2015   Procedure: SAVORY DILATION;  Surgeon: Josefine Class, MD;  Location: [facility] ENDOSCOPY;  Service: Endoscopy;  Laterality: N/A;  . SAVORY DILATION N/A  02/11/2016   Procedure: SAVORY DILATION;  Surgeon: Manya Silvas, MD;  Location: [facility] ENDOSCOPY;  Service: Endoscopy;  Laterality: N/A;  . SKIN CANCER EXCISION      Family History  Problem Relation Age of Onset  . Cancer Mother   . Depression Mother   . Anxiety disorder Mother   . Heart disease Father   . Cancer Father   . Anxiety disorder Father   . Depression Father   . Hypoparathyroidism Son     Social History:  reports that she has never smoked. She has never used smokeless tobacco. She reports that she does not drink alcohol or use drugs.  Review of Systems    History of depression, is on duloxetine and Seroquel long-term  However she is feeling more anxious and shaky since she was taken off some mood stabilizer  EXAM:  BP 130/80 (BP Location: Left Arm, Patient Position: Sitting, Cuff Size: Normal)   Pulse 87   Ht 5\' 4"  (1.626 m)   Wt 173 lb (78.5 kg)   SpO2 97%   BMI 29.70 kg/m    She looks well, somewhat anxious No alopecia No swelling of the face or ankles Biceps reflexes are slightly brisk  Assessment/Plan:    Muscle cramps:  These have been resolved and may have been due to HCTZ which has been stopped by PCP  Hypoparathyroidism: She is asymptomatic and will have calcium checked today She is consistent with her supplement and will adjust the dose if needed  Secondary HYPOTHYROIDISM: No consistent or typical symptoms  of hypothyroidism Currently on 50 mcg which is a stable dose  Difficult to assess since she has issues with anxiety and fatigue from other problems Free T4 will be checked today    Elayne Snare 05/22/2019, 3:21 PM

## 2019-05-23 LAB — COMPREHENSIVE METABOLIC PANEL
ALT: 18 U/L (ref 0–35)
AST: 16 U/L (ref 0–37)
Albumin: 4.6 g/dL (ref 3.5–5.2)
Alkaline Phosphatase: 97 U/L (ref 39–117)
BUN: 17 mg/dL (ref 6–23)
CO2: 27 mEq/L (ref 19–32)
Calcium: 9.3 mg/dL (ref 8.4–10.5)
Chloride: 106 mEq/L (ref 96–112)
Creatinine, Ser: 1.2 mg/dL (ref 0.40–1.20)
GFR: 45.87 mL/min — ABNORMAL LOW (ref >60.00)
Glucose, Bld: 83 mg/dL (ref 70–99)
Potassium: 3.9 mEq/L (ref 3.5–5.1)
Sodium: 143 mEq/L (ref 135–145)
Total Bilirubin: 0.4 mg/dL (ref 0.2–1.2)
Total Protein: 7.6 g/dL (ref 6.0–8.3)

## 2019-05-23 LAB — TSH: TSH: 3.66 u[IU]/mL (ref 0.35–4.50)

## 2019-05-23 LAB — T4, FREE: Free T4: 0.84 ng/dL (ref 0.60–1.60)

## 2019-05-24 ENCOUNTER — Other Ambulatory Visit: Payer: Self-pay | Admitting: Endocrinology

## 2019-05-25 NOTE — Progress Notes (Signed)
Please call to let patient know that the calcium and thyroid results are normal and no further action needed

## 2019-05-26 ENCOUNTER — Other Ambulatory Visit: Payer: Self-pay

## 2019-06-03 ENCOUNTER — Ambulatory Visit: Payer: BC Managed Care – PPO | Admitting: Pulmonary Disease

## 2019-06-05 ENCOUNTER — Encounter: Payer: Self-pay | Admitting: Psychiatry

## 2019-06-05 ENCOUNTER — Other Ambulatory Visit: Payer: Self-pay

## 2019-06-05 ENCOUNTER — Ambulatory Visit (INDEPENDENT_AMBULATORY_CARE_PROVIDER_SITE_OTHER): Payer: BC Managed Care – PPO | Admitting: Psychiatry

## 2019-06-05 DIAGNOSIS — F411 Generalized anxiety disorder: Secondary | ICD-10-CM | POA: Diagnosis not present

## 2019-06-05 DIAGNOSIS — F3130 Bipolar disorder, current episode depressed, mild or moderate severity, unspecified: Secondary | ICD-10-CM

## 2019-06-05 DIAGNOSIS — R251 Tremor, unspecified: Secondary | ICD-10-CM | POA: Diagnosis not present

## 2019-06-05 MED ORDER — DULOXETINE HCL 60 MG PO CPEP: 60.0000 mg | ORAL_CAPSULE | Freq: Two times a day (BID) | ORAL | 1 refills | Status: DC

## 2019-06-05 NOTE — Progress Notes (Signed)
Virtual Visit via Video Gordon  I connected with Kaylee Gordon on 06/05/19 at  2:15 PM EST by a video enabled telemedicine application and verified that I am speaking with the correct person using two identifiers.   I discussed the limitations of evaluation and management by telemedicine and the availability of in person appointments. The patient expressed understanding and agreed to proceed.   I discussed the assessment and treatment plan with the patient. The patient was provided an opportunity to ask questions and all were answered. The patient agreed with the plan and demonstrated an understanding of the instructions.   The patient was advised to call back or seek an in-person evaluation if the symptoms worsen or if the condition fails to improve as anticipated.   Kaylee Gordon  06/05/2019 3:00 PM Kaylee Gordon  MRN:  [id number]  Chief Complaint:  Chief Complaint    Follow-up     HPI: Kaylee Gordon is a 58 year old Caucasian female who has a history of bipolar disorder, GAD, fibromyalgia, tremors, is married, lives in [place], was evaluated by telemedicine today.  A video call was initiated however due to connection problem it had to be changed to a phone call.  Patient today reports she continues to struggle with anxiety symptoms.  She reports she is anxious all day.  She has been biting her nails a lot when she is anxious.  She reports she does not know what is making it worse however she would like to have medication changes since she is unable to cope with her anxiety at this time.  She reports she has been taking hydroxyzine as needed however may have been taking more than what was prescribed and she has ran out already.  She reports sleep is good on the Seroquel.  She denies any side effects to the Seroquel.  She reports her tremors have improved since she is off of the Depakote at this time.  Patient denies any suicidality, homicidality or perceptual  disturbances.  She continues to be in psychotherapy sessions with Ms. Miguel Dibble which is beneficial.  Patient denies any other concerns today. Visit Diagnosis:    ICD-10-CM   1. Bipolar I disorder, most recent episode depressed ([place])  F31.30   2. GAD (generalized anxiety disorder) Active F41.1 DULoxetine (CYMBALTA) 60 MG capsule  3. Tremor  R25.1     Past Psychiatric History: I have reviewed past psychiatric history from my progress Gordon on 09/11/2018  Past Medical History:  Past Medical History:  Diagnosis Date  . Anxiety   . Asthma   . Cancer [facility])    SKIN CANCER  . CHF (congestive heart failure) ([place])   . Depression   . Hypertension   . Thyroid disease     Past Surgical History:  Procedure Laterality Date  . ANKLE SURGERY Right 2012  . APPENDECTOMY    . AUGMENTATION MAMMAPLASTY Bilateral   . BREAST ENHANCEMENT SURGERY    . COLONOSCOPY WITH PROPOFOL N/A 02/22/2015   Procedure: COLONOSCOPY WITH PROPOFOL;  Surgeon: Josefine Class, MD;  Location: [facility] ENDOSCOPY;  Service: Endoscopy;  Laterality: N/A;  . ESOPHAGOGASTRODUODENOSCOPY (EGD) WITH PROPOFOL N/A 02/22/2015   Procedure: ESOPHAGOGASTRODUODENOSCOPY (EGD) WITH PROPOFOL;  Surgeon: Josefine Class, MD;  Location: [facility] ENDOSCOPY;  Service: Endoscopy;  Laterality: N/A;  . ESOPHAGOGASTRODUODENOSCOPY (EGD) WITH PROPOFOL N/A 02/11/2016   Procedure: ESOPHAGOGASTRODUODENOSCOPY (EGD) WITH PROPOFOL;  Surgeon: Manya Silvas, MD;  Location: [facility] ENDOSCOPY;  Service: Endoscopy;  Laterality: N/A;  . HEMORRHOID  SURGERY    . NASAL SINUS SURGERY    . SAVORY DILATION N/A 02/22/2015   Procedure: SAVORY DILATION;  Surgeon: Josefine Class, MD;  Location: [facility] ENDOSCOPY;  Service: Endoscopy;  Laterality: N/A;  . SAVORY DILATION N/A 02/11/2016   Procedure: SAVORY DILATION;  Surgeon: Manya Silvas, MD;  Location: [facility] ENDOSCOPY;  Service: Endoscopy;  Laterality: N/A;  . SKIN CANCER EXCISION      Family Psychiatric History: I  have reviewed family psychiatric history from my progress Gordon on 09/11/2018  Family History:  Family History  Problem Relation Age of Onset  . Cancer Mother   . Depression Mother   . Anxiety disorder Mother   . Heart disease Father   . Cancer Father   . Anxiety disorder Father   . Depression Father   . Hypoparathyroidism Son     Social History: I have reviewed social history from my progress Gordon on 09/11/2018 Social History   Socioeconomic History  . Marital status: Married    Spouse name: Legrand Como  . Number of children: Not on file  . Years of education: Not on file  . Highest education level: Not on file  Occupational History  . Not on file  Social Needs  . Financial resource strain: Not hard at all  . Food insecurity    Worry: Never true    Inability: Never true  . Transportation needs    Medical: No    Non-medical: No  Tobacco Use  . Smoking status: Never Smoker  . Smokeless tobacco: Never Used  Substance and Sexual Activity  . Alcohol use: No    Alcohol/week: 0.0 standard drinks    Comment: MAYBE ONCE OR TWICE A MONTH  . Drug use: No  . Sexual activity: Not Currently    Partners: Male  Lifestyle  . Physical activity    Days per week: 0 days    Minutes per session: Not on file  . Stress: Very much  Relationships  . Social Herbalist on phone: Twice a week    Gets together: Twice a week    Attends religious service: More than 4 times per year    Active member of club or organization: Not on file    Attends meetings of clubs or organizations: Not on file    Relationship status: Married  Other Topics Concern  . Not on file  Social History Narrative  . Not on file    Allergies:  Allergies  Allergen Reactions  . Atorvastatin   . Hydrochlorothiazide Other (See Comments)    Intolerance - dry lips  . Parafon Forte Dsc [Chlorzoxazone]   . Pollen Extract   . Sudafed [Pseudoephedrine Hcl]     Heart racing    Metabolic Disorder Labs: No  results found for: HGBA1C, MPG Lab Results  Component Value Date   PROLACTIN 18.3 07/22/2014   No results found for: CHOL, TRIG, HDL, CHOLHDL, VLDL, LDLCALC Lab Results  Component Value Date   TSH 3.66 05/22/2019   TSH 1.76 11/15/2018    Therapeutic Level Labs: No results found for: LITHIUM Lab Results  Component Value Date   VALPROATE 39 (L) 02/04/2019   No components found for:  CBMZ  Current Medications: Current Outpatient Medications  Medication Sig Dispense Refill  . azelastine (ASTELIN) 0.1 % nasal spray Place into the nose.    . benztropine (COGENTIN) 0.5 MG tablet Take 1 tablet (0.5 mg total) by mouth daily as needed for tremors. For muscle  spasms 30 tablet 1  . calcitRIOL (ROCALTROL) 0.25 MCG capsule TAKE ONE CAPSULE BY MOUTH DAILY 30 capsule 2  . DULoxetine (CYMBALTA) 60 MG capsule Take 1 capsule (60 mg total) by mouth 2 (two) times daily. 60 capsule 1  . ferrous sulfate 325 (65 FE) MG tablet Take by mouth.    . fluticasone (FLONASE) 50 MCG/ACT nasal spray     . gabapentin (NEURONTIN) 300 MG capsule TAKE TWO CAPSULES BY MOUTH EVERY MORNING; THEN ONE CAPSULE BY MOUTH MIDDAY; THEN TAKE TWO CAPSULES BY MOUTH EVERY EVENING    . guaiFENesin-codeine 100-10 MG/5ML syrup Take by mouth.    . hydrOXYzine (VISTARIL) 50 MG capsule Take 1 capsule (50 mg total) by mouth 2 (two) times daily as needed for anxiety. 60 capsule 1  . hyoscyamine (LEVSIN, ANASPAZ) 0.125 MG tablet Take by mouth.    . levothyroxine (SYNTHROID) 50 MCG tablet TAKE 1.5 TABLETS BY MOUTH DAILY 45 tablet 0  . lisinopril (ZESTRIL) 10 MG tablet Take 10 mg by mouth daily.    Marland Kitchen lovastatin (MEVACOR) 20 MG tablet Take 20 mg by mouth at bedtime.    . pantoprazole (PROTONIX) 40 MG tablet Take 1 tablet (40 mg total) by mouth daily. 30 tablet 1  . propranolol (INDERAL) 20 MG tablet Take 20 mg by mouth 2 (two) times daily.     . QUEtiapine (SEROQUEL) 100 MG tablet Take 1 tablet (100 mg total) by mouth at bedtime. To be  combined with 50 mg 30 tablet 1  . QUEtiapine (SEROQUEL) 50 MG tablet Take 1 tablet (50 mg total) by mouth at bedtime as needed. For mood and sleep 30 tablet 1  . vitamin B-12 (CYANOCOBALAMIN) 1000 MCG tablet Take by mouth.     No current facility-administered medications for this visit.      Musculoskeletal: Strength & Muscle Tone: UTA Gait & Station: Reports as WNL Patient leans: N/A  Psychiatric Specialty Exam: Review of Systems  Psychiatric/Behavioral: The patient is nervous/anxious.   All other systems reviewed and are negative.   There were no vitals taken for this visit.There is no height or weight on file to calculate BMI.  General Appearance: UTA  Eye Contact:  UTA  Speech:  Clear and Coherent  Volume:  Normal  Mood:  Anxious  Affect:  UTA  Thought Process:  Goal Directed and Descriptions of Associations: Intact  Orientation:  Full (Time, Place, and Person)  Thought Content: Logical   Suicidal Thoughts:  No  Homicidal Thoughts:  No  Memory:  Immediate;   Fair Recent;   Fair Remote;   Fair  Judgement:  Fair  Insight:  Fair  Psychomotor Activity:  UTA  Concentration:  Concentration: Fair and Attention Span: Fair  Recall:  [employer] of Knowledge: Fair  Language: Fair  Akathisia:  No  Handed:  Right  AIMS (if indicated): Tremors - BL hands - improving  Assets:  Communication Skills Desire for Improvement Housing Intimacy Social Support Talents/Skills  ADL's:  Intact  Cognition: WNL  Sleep:  Fair   Screenings:   Assessment and Plan: Nana is a 58 year old Caucasian female, married, lives in [place], has a history of bipolar disorder, anxiety disorder, fibromyalgia, IBS was evaluated by telemedicine today.  Patient continues to struggle with anxiety symptoms.  Patient with multiple psychosocial stressors including relationship struggles, pain.  Patient will continue to benefit from medication readjustment.  Plan Bipolar disorder-improving Seroquel  150 mg p.o. nightly Hydroxyzine 50 mg p.o. twice daily as needed for  anxiety She is on gabapentin prescribed for pain which is also a mood stabilizer.  GAD-unstable Increase Cymbalta to 60 mg p.o. twice daily Seroquel as prescribed Hydroxyzine 50 mg p.o. twice daily as needed.  Discussed with patient not to overuse her medications.  Provided medication education. Patient advised to continue to work with Ms. Miguel Dibble.  Tremors-improving Patient reports tremors is improved since stopping the Depakote.  Follow-up in clinic in 3 to 4 weeks or sooner if needed.  December 14 at 1 PM  I have spent atleast 15 minutes non face to face with patient today. More than 50 % of the time was spent for psychoeducation and supportive psychotherapy and care coordination. This Gordon was generated in part or whole with voice recognition software. Voice recognition is usually quite accurate but there are transcription errors that can and very often do occur. I apologize for any typographical errors that were not detected and corrected.        Ursula Alert, MD 06/05/2019, 3:00 PM

## 2019-06-25 ENCOUNTER — Other Ambulatory Visit: Payer: Self-pay | Admitting: Endocrinology

## 2019-06-25 ENCOUNTER — Other Ambulatory Visit: Payer: Self-pay | Admitting: Psychiatry

## 2019-06-25 DIAGNOSIS — F3130 Bipolar disorder, current episode depressed, mild or moderate severity, unspecified: Secondary | ICD-10-CM

## 2019-06-30 ENCOUNTER — Encounter: Payer: Self-pay | Admitting: Psychiatry

## 2019-06-30 ENCOUNTER — Ambulatory Visit (INDEPENDENT_AMBULATORY_CARE_PROVIDER_SITE_OTHER): Payer: BC Managed Care – PPO | Admitting: Psychiatry

## 2019-06-30 ENCOUNTER — Other Ambulatory Visit: Payer: Self-pay

## 2019-06-30 DIAGNOSIS — F3130 Bipolar disorder, current episode depressed, mild or moderate severity, unspecified: Secondary | ICD-10-CM

## 2019-06-30 DIAGNOSIS — R251 Tremor, unspecified: Secondary | ICD-10-CM | POA: Diagnosis not present

## 2019-06-30 DIAGNOSIS — F411 Generalized anxiety disorder: Secondary | ICD-10-CM | POA: Diagnosis not present

## 2019-06-30 NOTE — Progress Notes (Signed)
Virtual Visit via Video Note  I connected with Tonye Royalty on 06/30/19 at  1:00 PM EST by a video enabled telemedicine application and verified that I am speaking with the correct person using two identifiers.   I discussed the limitations of evaluation and management by telemedicine and the availability of in person appointments. The patient expressed understanding and agreed to proceed.     I discussed the assessment and treatment plan with the patient. The patient was provided an opportunity to ask questions and all were answered. The patient agreed with the plan and demonstrated an understanding of the instructions.   The patient was advised to call back or seek an in-person evaluation if the symptoms worsen or if the condition fails to improve as anticipated.   [place] MD OP Progress Note  06/30/2019 2:49 PM BODHI DALUZ  MRN:  [id number]  Chief Complaint:  Chief Complaint    Follow-up     HPI: Imanie is a 58 year old Caucasian female who has a history of bipolar disorder, GAD, fibromyalgia, tremors, married, lives in [place], was evaluated by telemedicine today.  Patient today reports she is currently anxious.  She reports she was exposed to COVID-19.  Her husband got tested positive.  She reports other family members also tested positive from other exposures.  She reports her daughter who came back from college is currently having a fever.  She and her daughter are going to get tested for COVID-19 this afternoon.  She reports that does make her anxious.  She is taking the hydroxyzine as needed which helps.  She however reports she has been taking the Cymbalta lower dosage and did not go up.  She forgot about it.  She however agrees to go up on the Cymbalta to 60 mg twice a day.  Patient reports sleep is good.  She continues to talk to her therapist and reports therapy sessions is going well.  Patient denies any suicidality, homicidality or perceptual  disturbances.  Patient denies any other concerns today. Visit Diagnosis:    ICD-10-CM   1. Bipolar I disorder, most recent episode depressed ([place])  F31.30    improving  2. GAD (generalized anxiety disorder)  F41.1   3. Tremor  R25.1     Past Psychiatric History: I have reviewed past psychiatric history from my progress note on 09/11/2018.  Past Medical History:  Past Medical History:  Diagnosis Date  . Anxiety   . Asthma   . Cancer [facility])    SKIN CANCER  . CHF (congestive heart failure) ([place])   . Depression   . Hypertension   . Thyroid disease     Past Surgical History:  Procedure Laterality Date  . ANKLE SURGERY Right 2012  . APPENDECTOMY    . AUGMENTATION MAMMAPLASTY Bilateral   . BREAST ENHANCEMENT SURGERY    . COLONOSCOPY WITH PROPOFOL N/A 02/22/2015   Procedure: COLONOSCOPY WITH PROPOFOL;  Surgeon: Josefine Class, MD;  Location: [facility] ENDOSCOPY;  Service: Endoscopy;  Laterality: N/A;  . ESOPHAGOGASTRODUODENOSCOPY (EGD) WITH PROPOFOL N/A 02/22/2015   Procedure: ESOPHAGOGASTRODUODENOSCOPY (EGD) WITH PROPOFOL;  Surgeon: Josefine Class, MD;  Location[facility] ENDOSCOPY;  Service: Endoscopy;  Laterality: N/A;  . ESOPHAGOGASTRODUODENOSCOPY (EGD) WITH PROPOFOL N/A 02/11/2016   Procedure: ESOPHAGOGASTRODUODENOSCOPY (EGD) WITH PROPOFOL;  Surgeon: Manya Silvas, MD;  Location: [facility] ENDOSCOPY;  Service: Endoscopy;  Laterality: N/A;  . HEMORRHOID SURGERY    . NASAL SINUS SURGERY    . SAVORY DILATION N/A 02/22/2015   Procedure: SAVORY  DILATION;  Surgeon: Josefine Class, MD;  Location: [facility] ENDOSCOPY;  Service: Endoscopy;  Laterality: N/A;  . SAVORY DILATION N/A 02/11/2016   Procedure: SAVORY DILATION;  Surgeon: Manya Silvas, MD;  Location: [facility] ENDOSCOPY;  Service: Endoscopy;  Laterality: N/A;  . SKIN CANCER EXCISION      Family Psychiatric History: I have reviewed family psychiatric history from my progress note on 09/11/2018.  Family History:  Family History   Problem Relation Age of Onset  . Cancer Mother   . Depression Mother   . Anxiety disorder Mother   . Heart disease Father   . Cancer Father   . Anxiety disorder Father   . Depression Father   . Hypoparathyroidism Son     Social History: I have reviewed social history from my progress note on 09/11/2018. Social History   Socioeconomic History  . Marital status: Married    Spouse name: Legrand Como  . Number of children: Not on file  . Years of education: Not on file  . Highest education level: Not on file  Occupational History  . Not on file  Tobacco Use  . Smoking status: Never Smoker  . Smokeless tobacco: Never Used  Substance and Sexual Activity  . Alcohol use: No    Alcohol/week: 0.0 standard drinks    Comment: MAYBE ONCE OR TWICE A MONTH  . Drug use: No  . Sexual activity: Not Currently    Partners: Male  Other Topics Concern  . Not on file  Social History Narrative  . Not on file   Social Determinants of Health   Financial Resource Strain:   . Difficulty of Paying Living Expenses: Not on file  Food Insecurity:   . Worried About Charity fundraiser in the Last Year: Not on file  . Ran Out of Food in the Last Year: Not on file  Transportation Needs:   . Lack of Transportation (Medical): Not on file  . Lack of Transportation (Non-Medical): Not on file  Physical Activity:   . Days of Exercise per Week: Not on file  . Minutes of Exercise per Session: Not on file  Stress:   . Feeling of Stress : Not on file  Social Connections:   . Frequency of Communication with Friends and Family: Not on file  . Frequency of Social Gatherings with Friends and Family: Not on file  . Attends Religious Services: Not on file  . Active Member of Clubs or Organizations: Not on file  . Attends Archivist Meetings: Not on file  . Marital Status: Not on file    Allergies:  Allergies  Allergen Reactions  . Atorvastatin   . Hydrochlorothiazide Other (See Comments)     Intolerance - dry lips  . Parafon Forte Dsc [Chlorzoxazone]   . Pollen Extract   . Sudafed [Pseudoephedrine Hcl]     Heart racing    Metabolic Disorder Labs: No results found for: HGBA1C, MPG Lab Results  Component Value Date   PROLACTIN 18.3 07/22/2014   No results found for: CHOL, TRIG, HDL, CHOLHDL, VLDL, LDLCALC Lab Results  Component Value Date   TSH 3.66 05/22/2019   TSH 1.76 11/15/2018    Therapeutic Level Labs: No results found for: LITHIUM Lab Results  Component Value Date   VALPROATE 39 (L) 02/04/2019   No components found for:  CBMZ  Current Medications: Current Outpatient Medications  Medication Sig Dispense Refill  . azelastine (ASTELIN) 0.1 % nasal spray Place into the nose.    Marland Kitchen  benztropine (COGENTIN) 0.5 MG tablet TAKE ONE TABLET BY MOUTH DAILY AS NEEDED FOR TREMORS/MUSCLE SPASMS 30 tablet 0  . calcitRIOL (ROCALTROL) 0.25 MCG capsule TAKE ONE CAPSULE BY MOUTH DAILY 30 capsule 2  . DULoxetine (CYMBALTA) 60 MG capsule Take 1 capsule (60 mg total) by mouth 2 (two) times daily. 60 capsule 1  . ferrous sulfate 325 (65 FE) MG tablet Take by mouth.    . fluticasone (FLONASE) 50 MCG/ACT nasal spray     . gabapentin (NEURONTIN) 300 MG capsule TAKE TWO CAPSULES BY MOUTH EVERY MORNING; THEN ONE CAPSULE BY MOUTH MIDDAY; THEN TAKE TWO CAPSULES BY MOUTH EVERY EVENING    . guaiFENesin-codeine 100-10 MG/5ML syrup Take by mouth.    . hydrOXYzine (VISTARIL) 50 MG capsule Take 1 capsule (50 mg total) by mouth 2 (two) times daily as needed for anxiety. 60 capsule 1  . hyoscyamine (LEVSIN, ANASPAZ) 0.125 MG tablet Take by mouth.    . levothyroxine (SYNTHROID) 50 MCG tablet TAKE 1.5 TABLETS BY MOUTH DAILY 45 tablet 0  . lisinopril (ZESTRIL) 10 MG tablet Take 10 mg by mouth daily.    Marland Kitchen lovastatin (MEVACOR) 20 MG tablet Take 20 mg by mouth at bedtime.    . pantoprazole (PROTONIX) 40 MG tablet Take 1 tablet (40 mg total) by mouth daily. 30 tablet 1  . propranolol (INDERAL) 20 MG  tablet Take 20 mg by mouth 2 (two) times daily.     . QUEtiapine (SEROQUEL) 100 MG tablet Take 1 tablet (100 mg total) by mouth at bedtime. To be combined with 50 mg 30 tablet 1  . QUEtiapine (SEROQUEL) 50 MG tablet Take 1 tablet (50 mg total) by mouth at bedtime as needed. For mood and sleep 30 tablet 1  . vitamin B-12 (CYANOCOBALAMIN) 1000 MCG tablet Take by mouth.     No current facility-administered medications for this visit.     Musculoskeletal: Strength & Muscle Tone: UTA Gait & Station: normal Patient leans: N/A  Psychiatric Specialty Exam: Review of Systems  Constitutional: Positive for fatigue.  Musculoskeletal: Positive for myalgias.  Psychiatric/Behavioral: The patient is nervous/anxious.   All other systems reviewed and are negative.   There were no vitals taken for this visit.There is no height or weight on file to calculate BMI.  General Appearance: Casual  Eye Contact:  Fair  Speech:  Clear and Coherent  Volume:  Normal  Mood:  Anxious  Affect:  Congruent  Thought Process:  Goal Directed and Descriptions of Associations: Intact  Orientation:  Full (Time, Place, and Person)  Thought Content: Logical   Suicidal Thoughts:  No  Homicidal Thoughts:  No  Memory:  Immediate;   Fair Recent;   Fair Remote;   Fair  Judgement:  Fair  Insight:  Fair  Psychomotor Activity:  Normal  Concentration:  Concentration: Fair and Attention Span: Fair  Recall:  [employer] of Knowledge: Fair  Language: Fair  Akathisia:  No  Handed:  Right  AIMS (if indicated): tremors- BL - hands - improving  Assets:  Communication Skills Desire for Improvement Housing Social Support  ADL's:  Intact  Cognition: WNL  Sleep:  Fair   Screenings:   Assessment and Plan: Morrigan is a 58 year old Caucasian female, married, lives in [place], has a history of bipolar disorder, anxiety disorder, fibromyalgia, IBS was evaluated by telemedicine today.  Patient continues to struggle with anxiety  symptoms.  She is currently struggling with psychosocial stressors of possible COVID-19 exposure, relationship struggles and pain.  She will benefit from medication readjustment, she has been noncompliant with the higher dosage of Cymbalta as discussed last visit.  Plan as noted below.  Plan Bipolar disorder-improving Seroquel 150 mg p.o. nightly Hydroxyzine 50 mg p.o. twice daily as needed for anxiety. She is on gabapentin prescribed for pain which is also a mood stabilizer.  GAD-some progress Cymbalta increased to 60 mg p.o. twice daily.  This was done last visit however she has been noncompliant.  Encouraged patient to increase the dosage. Seroquel as prescribed Hydroxyzine 50 mg p.o. twice daily as needed. Patient to continue psychotherapy sessions with her therapist Ms. Miguel Dibble.  Tremors-improving Patient's tremors are improving since stopping the Depakote.  Follow-up in clinic in 3 weeks or sooner if needed.  January 4 at 4:20 PM  I have spent atleast 15 minutes non face to face with patient today. More than 50 % of the time was spent for psychoeducation and supportive psychotherapy and care coordination. This note was generated in part or whole with voice recognition software. Voice recognition is usually quite accurate but there are transcription errors that can and very often do occur. I apologize for any typographical errors that were not detected and corrected.       Ursula Alert, MD 06/30/2019, 2:49 PM

## 2019-07-09 ENCOUNTER — Other Ambulatory Visit: Payer: Self-pay | Admitting: Psychiatry

## 2019-07-09 ENCOUNTER — Other Ambulatory Visit: Payer: Self-pay | Admitting: Endocrinology

## 2019-07-09 DIAGNOSIS — F3130 Bipolar disorder, current episode depressed, mild or moderate severity, unspecified: Secondary | ICD-10-CM

## 2019-07-21 ENCOUNTER — Encounter: Payer: Self-pay | Admitting: Psychiatry

## 2019-07-21 ENCOUNTER — Ambulatory Visit (INDEPENDENT_AMBULATORY_CARE_PROVIDER_SITE_OTHER): Payer: BC Managed Care – PPO | Admitting: Psychiatry

## 2019-07-21 ENCOUNTER — Other Ambulatory Visit: Payer: Self-pay

## 2019-07-21 DIAGNOSIS — R251 Tremor, unspecified: Secondary | ICD-10-CM

## 2019-07-21 DIAGNOSIS — F411 Generalized anxiety disorder: Secondary | ICD-10-CM

## 2019-07-21 DIAGNOSIS — F3130 Bipolar disorder, current episode depressed, mild or moderate severity, unspecified: Secondary | ICD-10-CM

## 2019-07-21 MED ORDER — HYDROXYZINE PAMOATE 50 MG PO CAPS: 50.0000 mg | ORAL_CAPSULE | Freq: Two times a day (BID) | ORAL | 1 refills | Status: DC | PRN

## 2019-07-21 MED ORDER — DULOXETINE HCL 60 MG PO CPEP: 60.0000 mg | ORAL_CAPSULE | Freq: Two times a day (BID) | ORAL | 1 refills | Status: DC

## 2019-07-21 MED ORDER — QUETIAPINE FUMARATE 100 MG PO TABS: ORAL_TABLET | ORAL | 1 refills | Status: DC

## 2019-07-21 MED ORDER — QUETIAPINE FUMARATE 50 MG PO TABS: 50.0000 mg | ORAL_TABLET | Freq: Every evening | ORAL | 1 refills | Status: DC | PRN

## 2019-07-21 MED ORDER — BENZTROPINE MESYLATE 0.5 MG PO TABS: ORAL_TABLET | ORAL | 1 refills | Status: DC

## 2019-07-21 NOTE — Progress Notes (Addendum)
Virtual Visit via Video Note  I connected with Kaylee Gordon on 07/21/19 at  4:20 PM EST by a video enabled telemedicine application and verified that I am speaking with the correct person using two identifiers.   I discussed the limitations of evaluation and management by telemedicine and the availability of in person appointments. The patient expressed understanding and agreed to proceed.     I discussed the assessment and treatment plan with the patient. The patient was provided an opportunity to ask questions and all were answered. The patient agreed with the plan and demonstrated an understanding of the instructions.   The patient was advised to call back or seek an in-person evaluation if the symptoms worsen or if the condition fails to improve as anticipated.   [facility] MD OP Progress Note  07/21/2019 5:17 PM Kaylee Gordon  MRN:  [id number]  Chief Complaint:  Chief Complaint    Follow-up     HPI: Kaylee Gordon is a 59 year old Caucasian female who has a history of bipolar disorder, GAD, fibromyalgia, tremors, is married, lives in [place], was evaluated by telemedicine today.  Patient today reports she was exposed to COVID-19 however she tested negative.  She however reports she and her daughter both have a sinus infection.  She reports she feels congested and also has a fever.  She is currently on antibiotics as well as prednisone.  She reports she continues to feel restless at night due to her congestion.  Her sleep hence is affected.  She reports she is anxious about her health at this time.  She otherwise denies any significant mood symptoms.  She continues to take Cymbalta 60 mg twice a day.  She also uses hydroxyzine as needed for anxiety which helps.  Patient denies any suicidality, homicidality or perceptual disturbances.  Patient continues to work with Ms. Miguel Dibble and reports therapy sessions is helpful.  She denies any other concerns today. Visit Diagnosis:     ICD-10-CM   1. Bipolar I disorder, most recent episode depressed (HCC) Active F31.30 QUEtiapine (SEROQUEL) 100 MG tablet    QUEtiapine (SEROQUEL) 50 MG tablet   stable  2. GAD (generalized anxiety disorder) Active F41.1 hydrOXYzine (VISTARIL) 50 MG capsule    DULoxetine (CYMBALTA) 60 MG capsule  3. Tremor  R25.1 benztropine (COGENTIN) 0.5 MG tablet    Past Psychiatric History: I have reviewed past psychiatric history from my progress note on 09/11/2018  Past Medical History:  Past Medical History:  Diagnosis Date  . Anxiety   . Asthma   . Cancer [facility])    SKIN CANCER  . CHF (congestive heart failure) ([place])   . Depression   . Hypertension   . Thyroid disease     Past Surgical History:  Procedure Laterality Date  . ANKLE SURGERY Right 2012  . APPENDECTOMY    . AUGMENTATION MAMMAPLASTY Bilateral   . BREAST ENHANCEMENT SURGERY    . COLONOSCOPY WITH PROPOFOL N/A 02/22/2015   Procedure: COLONOSCOPY WITH PROPOFOL;  Surgeon: Josefine Class, MD;  Location: [facility] ENDOSCOPY;  Service: Endoscopy;  Laterality: N/A;  . ESOPHAGOGASTRODUODENOSCOPY (EGD) WITH PROPOFOL N/A 02/22/2015   Procedure: ESOPHAGOGASTRODUODENOSCOPY (EGD) WITH PROPOFOL;  Surgeon: Josefine Class, MD;  Location: [facility] ENDOSCOPY;  Service: Endoscopy;  Laterality: N/A;  . ESOPHAGOGASTRODUODENOSCOPY (EGD) WITH PROPOFOL N/A 02/11/2016   Procedure: ESOPHAGOGASTRODUODENOSCOPY (EGD) WITH PROPOFOL;  Surgeon: Manya Silvas, MD;  Location: [facility] ENDOSCOPY;  Service: Endoscopy;  Laterality: N/A;  . HEMORRHOID SURGERY    . NASAL SINUS  SURGERY    . SAVORY DILATION N/A 02/22/2015   Procedure: SAVORY DILATION;  Surgeon: Josefine Class, MD;  Location: [facility] ENDOSCOPY;  Service: Endoscopy;  Laterality: N/A;  . SAVORY DILATION N/A 02/11/2016   Procedure: SAVORY DILATION;  Surgeon: Manya Silvas, MD;  Location: [facility] ENDOSCOPY;  Service: Endoscopy;  Laterality: N/A;  . SKIN CANCER EXCISION      Family Psychiatric History: Reviewed  family psychiatric history from my progress note on 09/11/2018  Family History:  Family History  Problem Relation Age of Onset  . Cancer Mother   . Depression Mother   . Anxiety disorder Mother   . Heart disease Father   . Cancer Father   . Anxiety disorder Father   . Depression Father   . Hypoparathyroidism Son     Social History: Reviewed social history from my progress note on 09/11/2018 Social History   Socioeconomic History  . Marital status: Married    Spouse name: Legrand Como  . Number of children: Not on file  . Years of education: Not on file  . Highest education level: Not on file  Occupational History  . Not on file  Tobacco Use  . Smoking status: Never Smoker  . Smokeless tobacco: Never Used  Substance and Sexual Activity  . Alcohol use: No    Alcohol/week: 0.0 standard drinks    Comment: MAYBE ONCE OR TWICE A MONTH  . Drug use: No  . Sexual activity: Not Currently    Partners: Male  Other Topics Concern  . Not on file  Social History Narrative  . Not on file   Social Determinants of Health   Financial Resource Strain:   . Difficulty of Paying Living Expenses: Not on file  Food Insecurity:   . Worried About Charity fundraiser in the Last Year: Not on file  . Ran Out of Food in the Last Year: Not on file  Transportation Needs:   . Lack of Transportation (Medical): Not on file  . Lack of Transportation (Non-Medical): Not on file  Physical Activity:   . Days of Exercise per Week: Not on file  . Minutes of Exercise per Session: Not on file  Stress:   . Feeling of Stress : Not on file  Social Connections:   . Frequency of Communication with Friends and Family: Not on file  . Frequency of Social Gatherings with Friends and Family: Not on file  . Attends Religious Services: Not on file  . Active Member of Clubs or Organizations: Not on file  . Attends Archivist Meetings: Not on file  . Marital Status: Not on file    Allergies:  Allergies   Allergen Reactions  . Atorvastatin   . Hydrochlorothiazide Other (See Comments)    Intolerance - dry lips  . Parafon Forte Dsc [Chlorzoxazone]   . Pollen Extract   . Sudafed [Pseudoephedrine Hcl]     Heart racing    Metabolic Disorder Labs: No results found for: HGBA1C, MPG Lab Results  Component Value Date   PROLACTIN 18.3 07/22/2014   No results found for: CHOL, TRIG, HDL, CHOLHDL, VLDL, LDLCALC Lab Results  Component Value Date   TSH 3.66 05/22/2019   TSH 1.76 11/15/2018    Therapeutic Level Labs: No results found for: LITHIUM Lab Results  Component Value Date   VALPROATE 39 (L) 02/04/2019   No components found for:  CBMZ  Current Medications: Current Outpatient Medications  Medication Sig Dispense Refill  . amoxicillin (AMOXIL) 875  MG tablet Take by mouth.    . predniSONE (DELTASONE) 10 MG tablet 4 tabs po daily x 3 days, 3 tabs po daily x 3 days, 2 tabs po daily x 3 days, 1 tab po daily x 3 days    . azelastine (ASTELIN) 0.1 % nasal spray Place into the nose.    . benztropine (COGENTIN) 0.5 MG tablet TAKE ONE TABLET BY MOUTH DAILY AS NEEDED FOR TREMORS/MUSCLE SPASMS 30 tablet 1  . calcitRIOL (ROCALTROL) 0.25 MCG capsule TAKE ONE CAPSULE BY MOUTH DAILY 30 capsule 1  . DULoxetine (CYMBALTA) 60 MG capsule Take 1 capsule (60 mg total) by mouth 2 (two) times daily. 60 capsule 1  . ferrous sulfate 325 (65 FE) MG tablet Take by mouth.    . fluticasone (FLONASE) 50 MCG/ACT nasal spray     . gabapentin (NEURONTIN) 300 MG capsule TAKE TWO CAPSULES BY MOUTH EVERY MORNING; THEN ONE CAPSULE BY MOUTH MIDDAY; THEN TAKE TWO CAPSULES BY MOUTH EVERY EVENING    . guaiFENesin-codeine 100-10 MG/5ML syrup Take by mouth.    . hydrOXYzine (VISTARIL) 50 MG capsule Take 1 capsule (50 mg total) by mouth 2 (two) times daily as needed for anxiety. 60 capsule 1  . hyoscyamine (LEVSIN, ANASPAZ) 0.125 MG tablet Take by mouth.    . levothyroxine (SYNTHROID) 50 MCG tablet TAKE 1.5 TABLETS BY MOUTH  DAILY 45 tablet 0  . lisinopril (ZESTRIL) 10 MG tablet Take 10 mg by mouth daily.    Marland Kitchen lovastatin (MEVACOR) 20 MG tablet Take 20 mg by mouth at bedtime.    . pantoprazole (PROTONIX) 40 MG tablet Take 1 tablet (40 mg total) by mouth daily. 30 tablet 1  . propranolol (INDERAL) 20 MG tablet Take 20 mg by mouth 2 (two) times daily.     . QUEtiapine (SEROQUEL) 100 MG tablet TAKE ONE TABLET BY MOUTH EVERY NIGHT AT BEDTIME**TO BE COMBINED WITH 50 MG 30 tablet 1  . QUEtiapine (SEROQUEL) 50 MG tablet Take 1 tablet (50 mg total) by mouth at bedtime as needed. For mood and sleep 30 tablet 1  . vitamin B-12 (CYANOCOBALAMIN) 1000 MCG tablet Take by mouth.     No current facility-administered medications for this visit.     Musculoskeletal: Strength & Muscle Tone: UTA Gait & Station: normal Patient leans: N/A  Psychiatric Specialty Exam: Review of Systems  Constitutional: Positive for fatigue.  HENT: Positive for congestion, postnasal drip, sinus pressure and sinus pain.   Psychiatric/Behavioral: Positive for sleep disturbance. The patient is nervous/anxious.   All other systems reviewed and are negative.   There were no vitals taken for this visit.There is no height or weight on file to calculate BMI.  General Appearance: Casual  Eye Contact:  Fair  Speech:  Normal Rate  Volume:  Normal  Mood:  Anxious  Affect:  Congruent  Thought Process:  Goal Directed and Descriptions of Associations: Intact  Orientation:  Full (Time, Place, and Person)  Thought Content: Logical   Suicidal Thoughts:  No  Homicidal Thoughts:  No  Memory:  Immediate;   Fair Recent;   Fair Remote;   Fair  Judgement:  Fair  Insight:  Fair  Psychomotor Activity:  Normal  Concentration:  Concentration: Good and Attention Span: Fair  Recall:  [employer] of Knowledge: Fair  Language: Fair  Akathisia:  No  Handed:  Right  AIMS (if indicated): tremors- improving - hands   Assets:  Communication Skills Desire for  Improvement Social Support  ADL's:  Intact  Cognition: WNL  Sleep:  Restless   Screenings:   Assessment and Plan: Kaylee Gordon is a 58 year old Caucasian female, married, lives in [place], has a history of bipolar disorder, anxiety disorder, fibromyalgia, IBS was evaluated by telemedicine today.  Patient is currently making progress on the current medication regimen although she is struggling with sinus infection as well as restlessness from the same.  Patient advised to continue psychotherapy sessions as well as medications.  Plan as noted below.  Plan Bipolar disorder-stable Seroquel 150 mg p.o. nightly Hydroxyzine 50 mg p.o. twice daily as needed for anxiety. She is also on gabapentin prescribed for pain which is also a mood stabilizer.  GAD-improving Cymbalta 60 mg p.o. twice daily Hydroxyzine 50 mg p.o. twice daily as needed Patient advised to continue psychotherapy sessions with Ms. Miguel Dibble Seroquel as prescribed  Tremors-improving Patient is no longer on Depakote.  Discussed to continue Cogentin as needed  Follow-up in clinic in 1 month or sooner if needed.  February 8 at 4 PM  I have spent atleast 20 minutes non face to face with patient today. More than 50 % of the time was spent for psychoeducation and supportive psychotherapy and care coordination. This note was generated in part or whole with voice recognition software. Voice recognition is usually quite accurate but there are transcription errors that can and very often do occur. I apologize for any typographical errors that were not detected and corrected.       Ursula Alert, MD 07/21/2019, 5:17 PM

## 2019-07-30 ENCOUNTER — Other Ambulatory Visit: Payer: Self-pay | Admitting: Endocrinology

## 2019-08-14 ENCOUNTER — Other Ambulatory Visit: Payer: Self-pay | Admitting: Psychiatry

## 2019-08-14 DIAGNOSIS — F3130 Bipolar disorder, current episode depressed, mild or moderate severity, unspecified: Secondary | ICD-10-CM

## 2019-08-25 ENCOUNTER — Encounter: Payer: Self-pay | Admitting: Psychiatry

## 2019-08-25 ENCOUNTER — Other Ambulatory Visit: Payer: Self-pay

## 2019-08-25 ENCOUNTER — Ambulatory Visit (INDEPENDENT_AMBULATORY_CARE_PROVIDER_SITE_OTHER): Payer: BC Managed Care – PPO | Admitting: Psychiatry

## 2019-08-25 DIAGNOSIS — R251 Tremor, unspecified: Secondary | ICD-10-CM

## 2019-08-25 DIAGNOSIS — F3175 Bipolar disorder, in partial remission, most recent episode depressed: Secondary | ICD-10-CM | POA: Diagnosis not present

## 2019-08-25 DIAGNOSIS — F411 Generalized anxiety disorder: Secondary | ICD-10-CM | POA: Diagnosis not present

## 2019-08-25 MED ORDER — QUETIAPINE FUMARATE 100 MG PO TABS: 200.0000 mg | ORAL_TABLET | Freq: Every day | ORAL | 1 refills | Status: DC

## 2019-08-25 NOTE — Progress Notes (Signed)
Provider Location : [facility] Patient Location : Home  Virtual Visit via Telephone Note  I connected with Kaylee Gordon on 08/25/19 at  4:00 PM EST by telephone and verified that I am speaking with the correct person using two identifiers.   I discussed the limitations, risks, security and privacy concerns of performing an evaluation and management service by telephone and the availability of in person appointments. I also discussed with the patient that there may be a patient responsible charge related to this service. The patient expressed understanding and agreed to proceed.      I discussed the assessment and treatment plan with the patient. The patient was provided an opportunity to ask questions and all were answered. The patient agreed with the plan and demonstrated an understanding of the instructions.   The patient was advised to call back or seek an in-person evaluation if the symptoms worsen or if the condition fails to improve as anticipated. [place] MD OP Progress Note  08/25/2019 4:34 PM Kaylee Gordon  MRN:  [id number]  Chief Complaint:  Chief Complaint    Follow-up     HPI: Kaylee Gordon is a 59 year old Caucasian female who has a history of bipolar disorder, GAD, fibromyalgia, tremors , lives in [place], was evaluated by phone today.  Patient preferred to do a phone call.  Patient today reports she is currently struggling with pneumonia.  She reports that does make her to have sleep problems.  She reports she is currently making progress on the current antibiotics.  Patient reports mood wise she is doing okay otherwise.  She is compliant on her medications as prescribed.  Patient denies any suicidality, homicidality or perceptual disturbances.  Patient reports she is interested in increasing her Seroquel dosage today.  Patient continues to benefit from psychotherapy sessions and has been working with Ms. Miguel Dibble.  Patient denies any other concerns today. Visit  Diagnosis:    ICD-10-CM   1. Bipolar disorder, in partial remission, most recent episode depressed (HCC)  F31.75 QUEtiapine (SEROQUEL) 100 MG tablet  2. GAD (generalized anxiety disorder)  F41.1   3. Tremor  R25.1     Past Psychiatric History: I have reviewed past psychiatric history from my progress note on 09/11/2018.  Past Medical History:  Past Medical History:  Diagnosis Date  . Anxiety   . Asthma   . Cancer [facility])    SKIN CANCER  . CHF (congestive heart failure) ([place])   . Depression   . Hypertension   . Thyroid disease     Past Surgical History:  Procedure Laterality Date  . ANKLE SURGERY Right 2012  . APPENDECTOMY    . AUGMENTATION MAMMAPLASTY Bilateral   . BREAST ENHANCEMENT SURGERY    . COLONOSCOPY WITH PROPOFOL N/A 02/22/2015   Procedure: COLONOSCOPY WITH PROPOFOL;  Surgeon: Josefine Class, MD;  Location: [facility] ENDOSCOPY;  Service: Endoscopy;  Laterality: N/A;  . ESOPHAGOGASTRODUODENOSCOPY (EGD) WITH PROPOFOL N/A 02/22/2015   Procedure: ESOPHAGOGASTRODUODENOSCOPY (EGD) WITH PROPOFOL;  Surgeon: Josefine Class, MD;  Location: [facility] ENDOSCOPY;  Service: Endoscopy;  Laterality: N/A;  . ESOPHAGOGASTRODUODENOSCOPY (EGD) WITH PROPOFOL N/A 02/11/2016   Procedure: ESOPHAGOGASTRODUODENOSCOPY (EGD) WITH PROPOFOL;  Surgeon: Manya Silvas, MD;  Location: [facility] ENDOSCOPY;  Service: Endoscopy;  Laterality: N/A;  . HEMORRHOID SURGERY    . NASAL SINUS SURGERY    . SAVORY DILATION N/A 02/22/2015   Procedure: SAVORY DILATION;  Surgeon: Josefine Class, MD;  Location: [facility] ENDOSCOPY;  Service: Endoscopy;  Laterality: N/A;  .  SAVORY DILATION N/A 02/11/2016   Procedure: SAVORY DILATION;  Surgeon: Manya Silvas, MD;  Location: [facility] ENDOSCOPY;  Service: Endoscopy;  Laterality: N/A;  . SKIN CANCER EXCISION      Family Psychiatric History: I have reviewed family psychiatric history from my progress note on 09/11/2018.  Family History:  Family History  Problem Relation Age of Onset   . Cancer Mother   . Depression Mother   . Anxiety disorder Mother   . Heart disease Father   . Cancer Father   . Anxiety disorder Father   . Depression Father   . Hypoparathyroidism Son     Social History: I have reviewed social history from my progress note on 09/11/2018. Social History   Socioeconomic History  . Marital status: Married    Spouse name: Legrand Como  . Number of children: Not on file  . Years of education: Not on file  . Highest education level: Not on file  Occupational History  . Not on file  Tobacco Use  . Smoking status: Never Smoker  . Smokeless tobacco: Never Used  Substance and Sexual Activity  . Alcohol use: No    Alcohol/week: 0.0 standard drinks    Comment: MAYBE ONCE OR TWICE A MONTH  . Drug use: No  . Sexual activity: Not Currently    Partners: Male  Other Topics Concern  . Not on file  Social History Narrative  . Not on file   Social Determinants of Health   Financial Resource Strain:   . Difficulty of Paying Living Expenses: Not on file  Food Insecurity:   . Worried About Charity fundraiser in the Last Year: Not on file  . Ran Out of Food in the Last Year: Not on file  Transportation Needs:   . Lack of Transportation (Medical): Not on file  . Lack of Transportation (Non-Medical): Not on file  Physical Activity:   . Days of Exercise per Week: Not on file  . Minutes of Exercise per Session: Not on file  Stress:   . Feeling of Stress : Not on file  Social Connections:   . Frequency of Communication with Friends and Family: Not on file  . Frequency of Social Gatherings with Friends and Family: Not on file  . Attends Religious Services: Not on file  . Active Member of Clubs or Organizations: Not on file  . Attends Archivist Meetings: Not on file  . Marital Status: Not on file    Allergies:  Allergies  Allergen Reactions  . Atorvastatin   . Hydrochlorothiazide Other (See Comments)    Intolerance - dry lips  . Parafon  Forte Dsc [Chlorzoxazone]   . Pollen Extract   . Sudafed [Pseudoephedrine Hcl]     Heart racing    Metabolic Disorder Labs: No results found for: HGBA1C, MPG Lab Results  Component Value Date   PROLACTIN 18.3 07/22/2014   No results found for: CHOL, TRIG, HDL, CHOLHDL, VLDL, LDLCALC Lab Results  Component Value Date   TSH 3.66 05/22/2019   TSH 1.76 11/15/2018    Therapeutic Level Labs: No results found for: LITHIUM Lab Results  Component Value Date   VALPROATE 39 (L) 02/04/2019   No components found for:  CBMZ  Current Medications: Current Outpatient Medications  Medication Sig Dispense Refill  . propranolol (INNOPRAN XL) 80 MG 24 hr capsule Take by mouth.    Marland Kitchen azelastine (ASTELIN) 0.1 % nasal spray Place into the nose.    . benztropine (COGENTIN)  0.5 MG tablet TAKE ONE TABLET BY MOUTH DAILY AS NEEDED FOR TREMORS/MUSCLE SPASMS 30 tablet 1  . calcitRIOL (ROCALTROL) 0.25 MCG capsule TAKE ONE CAPSULE BY MOUTH DAILY 30 capsule 1  . doxycycline (VIBRA-TABS) 100 MG tablet Take 100 mg by mouth 2 (two) times daily.    . DULoxetine (CYMBALTA) 60 MG capsule Take 1 capsule (60 mg total) by mouth 2 (two) times daily. 60 capsule 1  . ferrous sulfate 325 (65 FE) MG tablet Take by mouth.    . fluticasone (FLONASE) 50 MCG/ACT nasal spray     . gabapentin (NEURONTIN) 300 MG capsule TAKE TWO CAPSULES BY MOUTH EVERY MORNING; THEN ONE CAPSULE BY MOUTH MIDDAY; THEN TAKE TWO CAPSULES BY MOUTH EVERY EVENING    . guaiFENesin-codeine 100-10 MG/5ML syrup Take by mouth.    . hydrOXYzine (VISTARIL) 50 MG capsule Take 1 capsule (50 mg total) by mouth 2 (two) times daily as needed for anxiety. 60 capsule 1  . hyoscyamine (LEVSIN, ANASPAZ) 0.125 MG tablet Take by mouth.    . levofloxacin (LEVAQUIN) 500 MG tablet Take 500 mg by mouth daily.    Marland Kitchen levothyroxine (SYNTHROID) 50 MCG tablet Take 1 tablet (50 mcg total) by mouth daily before breakfast. 45 tablet 0  . lisinopril (ZESTRIL) 10 MG tablet Take 10 mg  by mouth daily.    Marland Kitchen lovastatin (MEVACOR) 20 MG tablet Take 20 mg by mouth at bedtime.    . pantoprazole (PROTONIX) 40 MG tablet Take 1 tablet (40 mg total) by mouth daily. 30 tablet 1  . predniSONE (DELTASONE) 10 MG tablet 4 tabs po daily x 3 days, 3 tabs po daily x 3 days, 2 tabs po daily x 3 days, 1 tab po daily x 3 days    . propranolol (INDERAL) 20 MG tablet Take 20 mg by mouth 2 (two) times daily.     . propranolol ER (INDERAL LA) 80 MG 24 hr capsule Take 80 mg by mouth daily.    . QUEtiapine (SEROQUEL) 100 MG tablet Take 2 tablets (200 mg total) by mouth at bedtime. 60 tablet 1  . vitamin B-12 (CYANOCOBALAMIN) 1000 MCG tablet Take by mouth.     No current facility-administered medications for this visit.     Musculoskeletal: Strength & Muscle Tone: UTA Gait & Station: Reports as WNL  Patient leans: N/A  Psychiatric Specialty Exam: Review of Systems  HENT: Positive for congestion.   Psychiatric/Behavioral: Positive for sleep disturbance.  All other systems reviewed and are negative.   There were no vitals taken for this visit.There is no height or weight on file to calculate BMI.  General Appearance: UTA  Eye Contact:  UTA  Speech:  Clear and Coherent  Volume:  Normal  Mood:  Depressed improving  Affect:  UTA  Thought Process:  Goal Directed and Descriptions of Associations: Intact  Orientation:  Full (Time, Place, and Person)  Thought Content: Logical   Suicidal Thoughts:  No  Homicidal Thoughts:  No  Memory:  Immediate;   Fair Recent;   Fair Remote;   Fair  Judgement:  Fair  Insight:  Fair  Psychomotor Activity:  Tremor of hands per report - improving  Concentration:  Concentration: Fair and Attention Span: Fair  Recall:  [employer] of Knowledge: Fair  Language: Fair  Akathisia:  No  Handed:  Right  AIMS (if indicated):tremors improved - hands per report  Assets:  Communication Skills Desire for Improvement Housing Social Support  ADL's:  Intact  Cognition: WNL  Sleep:  Restless   Screenings:   Assessment and Plan: Josetta is a 59 year old Caucasian female, married, lives in [place], has a history of bipolar disorder, anxiety disorder, fibromyalgia, IBS was evaluated by telephone today.  Patient today reports she is currently struggling with pneumonia.  Patient does report sleep problems otherwise reports mood symptoms are stable.  Plan as noted below.  Plan Bipolar disorder in partial remission Increase Seroquel to 200 mg p.o. nightly . Hydroxyzine 50 mg p.o. twice daily as needed for anxiety. She is also on gabapentin prescribed for pain which is also a mood stabilizer.  GAD-improving Cymbalta 60 mg p.o. twice daily Hydroxyzine 50 mg p.o. twice daily as needed Patient advised to continue psychotherapy sessions with Ms. Miguel Dibble Seroquel as prescribed  Tremors-improving Patient is no longer on Depakote.  Continue Cogentin as needed.  Follow-up in clinic in 3 weeks or sooner if needed.  March 1 at 1 PM  I have spent atleast 20 minutes non face to face with patient today. More than 50 % of the time was spent for  ordering medications and test ,psychoeducation and supportive psychotherapy and care coordination,as well as documenting clinical information in electronic health record. This note was generated in part or whole with voice recognition software. Voice recognition is usually quite accurate but there are transcription errors that can and very often do occur. I apologize for any typographical errors that were not detected and corrected.       Ursula Alert, MD 08/25/2019, 4:34 PM

## 2019-09-03 ENCOUNTER — Other Ambulatory Visit: Payer: Self-pay | Admitting: Psychiatry

## 2019-09-03 DIAGNOSIS — F411 Generalized anxiety disorder: Secondary | ICD-10-CM

## 2019-09-10 ENCOUNTER — Other Ambulatory Visit: Payer: Self-pay | Admitting: Endocrinology

## 2019-09-15 ENCOUNTER — Other Ambulatory Visit: Payer: Self-pay

## 2019-09-15 ENCOUNTER — Encounter: Payer: Self-pay | Admitting: Psychiatry

## 2019-09-15 ENCOUNTER — Ambulatory Visit (INDEPENDENT_AMBULATORY_CARE_PROVIDER_SITE_OTHER): Payer: BC Managed Care – PPO | Admitting: Psychiatry

## 2019-09-15 DIAGNOSIS — F3175 Bipolar disorder, in partial remission, most recent episode depressed: Secondary | ICD-10-CM

## 2019-09-15 DIAGNOSIS — F411 Generalized anxiety disorder: Secondary | ICD-10-CM

## 2019-09-15 DIAGNOSIS — R251 Tremor, unspecified: Secondary | ICD-10-CM | POA: Diagnosis not present

## 2019-09-15 MED ORDER — BENZTROPINE MESYLATE 0.5 MG PO TABS: ORAL_TABLET | ORAL | 1 refills | Status: DC

## 2019-09-15 MED ORDER — HYDROXYZINE PAMOATE 50 MG PO CAPS: 50.0000 mg | ORAL_CAPSULE | Freq: Two times a day (BID) | ORAL | 1 refills | Status: DC | PRN

## 2019-09-15 NOTE — Progress Notes (Signed)
Provider Location : [facility] Patient Location : Home  Virtual Visit via Video Note  I connected with Kaylee Gordon on 09/15/19 at  1:00 PM EST by a video enabled telemedicine application and verified that I am speaking with the correct person using two identifiers.   I discussed the limitations of evaluation and management by telemedicine and the availability of in person appointments. The patient expressed understanding and agreed to proceed.     I discussed the assessment and treatment plan with the patient. The patient was provided an opportunity to ask questions and all were answered. The patient agreed with the plan and demonstrated an understanding of the instructions.   The patient was advised to call back or seek an in-person evaluation if the symptoms worsen or if the condition fails to improve as anticipated.  [facility] MD OP Progress Note  09/15/2019 1:26 PM Kaylee Gordon  MRN:  [id number]  Chief Complaint:  Chief Complaint    Follow-up     HPI: Kaylee Gordon is a 59 year old Caucasian female who has a history of bipolar disorder, GAD, fibromyalgia, tremor, lives in [place] was evaluated by telemedicine today.  Patient today reports she recently developed vertigo.  She reports she was advised to do some neck exercises at home however she is currently restricted to her home since she was asked not to drive anymore due to the vertigo.  This does make her anxious and sad.  She reports sleep is good on the current medication regimen.  Patient reports she is compliant on her medications as prescribed.  She reports she wants to stay on the medications as prescribed and does not want any medication changes.  She is agreeable to reaching out to her therapist to see her more frequently and work on her current psychosocial stressors and her sadness due to her health issues.  Patient denies any suicidality, homicidality or perceptual disturbances. Visit Diagnosis:    ICD-10-CM   1.  Bipolar disorder, in partial remission, most recent episode depressed ([place])  F31.75   2. GAD (generalized anxiety disorder) Active F41.1 hydrOXYzine (VISTARIL) 50 MG capsule  3. Tremor  R25.1 benztropine (COGENTIN) 0.5 MG tablet    Past Psychiatric History: I have reviewed past psychiatric history from my progress note on 09/11/2018.  Past Medical History:  Past Medical History:  Diagnosis Date  . Anxiety   . Asthma   . Cancer [facility])    SKIN CANCER  . CHF (congestive heart failure) ([place])   . Depression   . Hypertension   . Thyroid disease     Past Surgical History:  Procedure Laterality Date  . ANKLE SURGERY Right 2012  . APPENDECTOMY    . AUGMENTATION MAMMAPLASTY Bilateral   . BREAST ENHANCEMENT SURGERY    . COLONOSCOPY WITH PROPOFOL N/A 02/22/2015   Procedure: COLONOSCOPY WITH PROPOFOL;  Surgeon: Josefine Class, MD;  Location: [facility] ENDOSCOPY;  Service: Endoscopy;  Laterality: N/A;  . ESOPHAGOGASTRODUODENOSCOPY (EGD) WITH PROPOFOL N/A 02/22/2015   Procedure: ESOPHAGOGASTRODUODENOSCOPY (EGD) WITH PROPOFOL;  Surgeon: Josefine Class, MD;  Location: [facility] ENDOSCOPY;  Service: Endoscopy;  Laterality: N/A;  . ESOPHAGOGASTRODUODENOSCOPY (EGD) WITH PROPOFOL N/A 02/11/2016   Procedure: ESOPHAGOGASTRODUODENOSCOPY (EGD) WITH PROPOFOL;  Surgeon: Manya Silvas, MD;  Location: [facility] ENDOSCOPY;  Service: Endoscopy;  Laterality: N/A;  . HEMORRHOID SURGERY    . NASAL SINUS SURGERY    . SAVORY DILATION N/A 02/22/2015   Procedure: SAVORY DILATION;  Surgeon: Josefine Class, MD;  Location: [facility] ENDOSCOPY;  Service:  Endoscopy;  Laterality: N/A;  . SAVORY DILATION N/A 02/11/2016   Procedure: SAVORY DILATION;  Surgeon: Manya Silvas, MD;  Location: [facility] ENDOSCOPY;  Service: Endoscopy;  Laterality: N/A;  . SKIN CANCER EXCISION      Family Psychiatric History: I have reviewed family psychiatric history from my progress note on 09/11/2018.  Family History:  Family History  Problem Relation  Age of Onset  . Cancer Mother   . Depression Mother   . Anxiety disorder Mother   . Heart disease Father   . Cancer Father   . Anxiety disorder Father   . Depression Father   . Hypoparathyroidism Son     Social History: Reviewed social history from my progress note on 09/11/2018. Social History   Socioeconomic History  . Marital status: Married    Spouse name: Legrand Como  . Number of children: Not on file  . Years of education: Not on file  . Highest education level: Not on file  Occupational History  . Not on file  Tobacco Use  . Smoking status: Never Smoker  . Smokeless tobacco: Never Used  Substance and Sexual Activity  . Alcohol use: No    Alcohol/week: 0.0 standard drinks    Comment: MAYBE ONCE OR TWICE A MONTH  . Drug use: No  . Sexual activity: Not Currently    Partners: Male  Other Topics Concern  . Not on file  Social History Narrative  . Not on file   Social Determinants of Health   Financial Resource Strain:   . Difficulty of Paying Living Expenses: Not on file  Food Insecurity:   . Worried About Charity fundraiser in the Last Year: Not on file  . Ran Out of Food in the Last Year: Not on file  Transportation Needs:   . Lack of Transportation (Medical): Not on file  . Lack of Transportation (Non-Medical): Not on file  Physical Activity:   . Days of Exercise per Week: Not on file  . Minutes of Exercise per Session: Not on file  Stress:   . Feeling of Stress : Not on file  Social Connections:   . Frequency of Communication with Friends and Family: Not on file  . Frequency of Social Gatherings with Friends and Family: Not on file  . Attends Religious Services: Not on file  . Active Member of Clubs or Organizations: Not on file  . Attends Archivist Meetings: Not on file  . Marital Status: Not on file    Allergies:  Allergies  Allergen Reactions  . Atorvastatin   . Hydrochlorothiazide Other (See Comments)    Intolerance - dry lips  .  Parafon Forte Dsc [Chlorzoxazone]   . Pollen Extract   . Sudafed [Pseudoephedrine Hcl]     Heart racing    Metabolic Disorder Labs: No results found for: HGBA1C, MPG Lab Results  Component Value Date   PROLACTIN 18.3 07/22/2014   No results found for: CHOL, TRIG, HDL, CHOLHDL, VLDL, LDLCALC Lab Results  Component Value Date   TSH 3.66 05/22/2019   TSH 1.76 11/15/2018    Therapeutic Level Labs: No results found for: LITHIUM Lab Results  Component Value Date   VALPROATE 39 (L) 02/04/2019   No components found for:  CBMZ  Current Medications: Current Outpatient Medications  Medication Sig Dispense Refill  . ondansetron (ZOFRAN-ODT) 4 MG disintegrating tablet Take by mouth.    Marland Kitchen azelastine (ASTELIN) 0.1 % nasal spray Place into the nose.    Marland Kitchen  benztropine (COGENTIN) 0.5 MG tablet TAKE ONE TABLET BY MOUTH DAILY AS NEEDED FOR TREMORS/MUSCLE SPASMS 30 tablet 1  . calcitRIOL (ROCALTROL) 0.25 MCG capsule TAKE ONE CAPSULE BY MOUTH DAILY 30 capsule 0  . doxycycline (VIBRA-TABS) 100 MG tablet Take 100 mg by mouth 2 (two) times daily.    . DULoxetine (CYMBALTA) 60 MG capsule Take 1 capsule (60 mg total) by mouth 2 (two) times daily. 60 capsule 1  . ferrous sulfate 325 (65 FE) MG tablet Take by mouth.    . fluticasone (FLONASE) 50 MCG/ACT nasal spray     . gabapentin (NEURONTIN) 300 MG capsule TAKE TWO CAPSULES BY MOUTH EVERY MORNING; THEN ONE CAPSULE BY MOUTH MIDDAY; THEN TAKE TWO CAPSULES BY MOUTH EVERY EVENING    . guaiFENesin-codeine 100-10 MG/5ML syrup Take by mouth.    . hydrOXYzine (VISTARIL) 50 MG capsule Take 1 capsule (50 mg total) by mouth 2 (two) times daily as needed for anxiety. 60 capsule 1  . hyoscyamine (LEVSIN, ANASPAZ) 0.125 MG tablet Take by mouth.    . levofloxacin (LEVAQUIN) 500 MG tablet Take 500 mg by mouth daily.    Marland Kitchen levothyroxine (SYNTHROID) 50 MCG tablet Take 1 tablet (50 mcg total) by mouth daily before breakfast. 45 tablet 0  . lisinopril (ZESTRIL) 10 MG  tablet Take 10 mg by mouth daily.    Marland Kitchen lovastatin (MEVACOR) 20 MG tablet Take 20 mg by mouth at bedtime.    . ondansetron (ZOFRAN-ODT) 4 MG disintegrating tablet     . pantoprazole (PROTONIX) 40 MG tablet Take 1 tablet (40 mg total) by mouth daily. 30 tablet 1  . predniSONE (DELTASONE) 10 MG tablet 4 tabs po daily x 3 days, 3 tabs po daily x 3 days, 2 tabs po daily x 3 days, 1 tab po daily x 3 days    . propranolol (INDERAL) 20 MG tablet Take 20 mg by mouth 2 (two) times daily.     . propranolol (INNOPRAN XL) 80 MG 24 hr capsule Take by mouth.    . propranolol ER (INDERAL LA) 80 MG 24 hr capsule Take 80 mg by mouth daily.    . QUEtiapine (SEROQUEL) 100 MG tablet Take 2 tablets (200 mg total) by mouth at bedtime. 60 tablet 1  . vitamin B-12 (CYANOCOBALAMIN) 1000 MCG tablet Take by mouth.     No current facility-administered medications for this visit.     Musculoskeletal: Strength & Muscle Tone: UTA Gait & Station: normal Patient leans: N/A  Psychiatric Specialty Exam: Review of Systems  Neurological: Positive for dizziness.  Psychiatric/Behavioral: The patient is nervous/anxious.   All other systems reviewed and are negative.   There were no vitals taken for this visit.There is no height or weight on file to calculate BMI.  General Appearance: Casual  Eye Contact:  Fair  Speech:  Clear and Coherent  Volume:  Normal  Mood:  Anxious  Affect:  Congruent  Thought Process:  Goal Directed and Descriptions of Associations: Intact  Orientation:  Full (Time, Place, and Person)  Thought Content: Logical   Suicidal Thoughts:  No  Homicidal Thoughts:  No  Memory:  Immediate;   Fair Recent;   Fair Remote;   Fair  Judgement:  Fair  Insight:  Fair  Psychomotor Activity:  Normal  Concentration:  Concentration: Fair and Attention Span: Fair  Recall:  [employer] of Knowledge: Fair  Language: Fair  Akathisia:  No  Handed:  Right  AIMS (if indicated): UTA  Assets:  Communication  Skills Desire for Improvement Housing Social Support  ADL's:  Intact  Cognition: WNL  Sleep:  Fair   Screenings:   Assessment and Plan: Kaylee Gordon is a 59 year old Caucasian female, married, lives in [place], has a history of bipolar disorder, anxiety disorder, fibromyalgia, IBS was evaluated by telemedicine today.  She is currently struggling with health issues like vertigo which does affect her mood.  Patient will benefit from psychotherapy sessions.  Discussed plan as noted below.  Plan Bipolar disorder in partial remission Seroquel 200 mg p.o. nightly Hydroxyzine 50 mg p.o. twice daily as needed for anxiety She is also on gabapentin prescribed for pain which is a mood stabilizer.  GAD-some progress Patient does have psychosocial stressors of health issues which does make her anxious. Hydroxyzine 50 mg p.o. twice daily as needed, she has been noncompliant.  Patient encouraged to use it as needed. Cymbalta 60 mg p.o. twice daily Continue psychotherapy sessions with Ms. Miguel Dibble  Tremors-improving Continue Cogentin as needed.  Follow-up in clinic in 4 weeks or sooner if needed.  I have spent atleast 15 minutes non face to face with patient today. More than 50 % of the time was spent for ordering medications and test ,psychoeducation and supportive psychotherapy and care coordination,as well as documenting clinical information in electronic health record. This note was generated in part or whole with voice recognition software. Voice recognition is usually quite accurate but there are transcription errors that can and very often do occur. I apologize for any typographical errors that were not detected and corrected.       Ursula Alert, MD 09/15/2019, 1:26 PM

## 2019-09-18 ENCOUNTER — Other Ambulatory Visit: Payer: Self-pay | Admitting: Endocrinology

## 2019-09-20 ENCOUNTER — Other Ambulatory Visit: Payer: Self-pay | Admitting: Psychiatry

## 2019-09-20 DIAGNOSIS — R251 Tremor, unspecified: Secondary | ICD-10-CM

## 2019-10-05 ENCOUNTER — Other Ambulatory Visit: Payer: Self-pay | Admitting: Psychiatry

## 2019-10-05 DIAGNOSIS — F411 Generalized anxiety disorder: Secondary | ICD-10-CM

## 2019-10-08 ENCOUNTER — Other Ambulatory Visit: Payer: Self-pay | Admitting: Endocrinology

## 2019-10-14 ENCOUNTER — Ambulatory Visit: Payer: BC Managed Care – PPO | Admitting: Psychiatry

## 2019-10-25 ENCOUNTER — Other Ambulatory Visit: Payer: Self-pay | Admitting: Psychiatry

## 2019-10-25 DIAGNOSIS — F3175 Bipolar disorder, in partial remission, most recent episode depressed: Secondary | ICD-10-CM

## 2019-10-25 DIAGNOSIS — F3130 Bipolar disorder, current episode depressed, mild or moderate severity, unspecified: Secondary | ICD-10-CM

## 2019-10-27 NOTE — Telephone Encounter (Signed)
Sent seroquel to pharmacy 

## 2019-10-31 ENCOUNTER — Other Ambulatory Visit: Payer: Self-pay

## 2019-10-31 ENCOUNTER — Ambulatory Visit: Payer: BC Managed Care – PPO | Attending: Family Medicine

## 2019-10-31 DIAGNOSIS — R2681 Unsteadiness on feet: Secondary | ICD-10-CM | POA: Diagnosis present

## 2019-10-31 DIAGNOSIS — R42 Dizziness and giddiness: Secondary | ICD-10-CM | POA: Insufficient documentation

## 2019-10-31 NOTE — Therapy (Signed)
[place] MAIN [facility] SERVICES [address] [place], [place], [address] Phone: [phone]   Fax:  [phone]  Physical Therapy Evaluation  Patient Details  Name: Kaylee Gordon MRN: [id number] Date of Birth: [birth year] Referring Provider (PT): Dr. Netty Starring   Encounter Date: 10/31/2019  PT End of Session - 10/31/19 1044    Visit Number  1    Number of Visits  13    Date for PT Re-Evaluation  01/23/20    Authorization Type  eval 10/31/19    PT Start Time  0915    PT Stop Time  1030    PT Time Calculation (min)  75 min    Equipment Utilized During Treatment  Gait belt    Activity Tolerance  Patient tolerated treatment well    Behavior During Therapy  [facility] for tasks assessed/performed       Past Medical History:  Diagnosis Date  . Anxiety   . Asthma   . Cancer [facility])    SKIN CANCER  . CHF (congestive heart failure) ([place])   . Depression   . Hypertension   . Thyroid disease     Past Surgical History:  Procedure Laterality Date  . ANKLE SURGERY Right 2012  . APPENDECTOMY    . AUGMENTATION MAMMAPLASTY Bilateral   . BREAST ENHANCEMENT SURGERY    . COLONOSCOPY WITH PROPOFOL N/A 02/22/2015   Procedure: COLONOSCOPY WITH PROPOFOL;  Surgeon: Josefine Class, MD;  Location: [facility] ENDOSCOPY;  Service: Endoscopy;  Laterality: N/A;  . ESOPHAGOGASTRODUODENOSCOPY (EGD) WITH PROPOFOL N/A 02/22/2015   Procedure: ESOPHAGOGASTRODUODENOSCOPY (EGD) WITH PROPOFOL;  Surgeon: Josefine Class, MD;  Location: [facility] ENDOSCOPY;  Service: Endoscopy;  Laterality: N/A;  . ESOPHAGOGASTRODUODENOSCOPY (EGD) WITH PROPOFOL N/A 02/11/2016   Procedure: ESOPHAGOGASTRODUODENOSCOPY (EGD) WITH PROPOFOL;  Surgeon: Manya Silvas, MD;  Location: [facility] ENDOSCOPY;  Service: Endoscopy;  Laterality: N/A;  . HEMORRHOID SURGERY    . NASAL SINUS SURGERY    . SAVORY DILATION N/A 02/22/2015   Procedure: SAVORY DILATION;  Surgeon: Josefine Class, MD;  Location: [facility]  ENDOSCOPY;  Service: Endoscopy;  Laterality: N/A;  . SAVORY DILATION N/A 02/11/2016   Procedure: SAVORY DILATION;  Surgeon: Manya Silvas, MD;  Location: [facility] ENDOSCOPY;  Service: Endoscopy;  Laterality: N/A;  . SKIN CANCER EXCISION      There were no vitals filed for this visit.   Subjective Assessment - 10/31/19 0938    Subjective  Dizziness    Pertinent History  Pt referred by PCP for vertigo with concern for BPPV. Pt reports that she had a sinus infection around Christmas time 2020 followed by PNA and then started having vertigo. She does not recall the vertigo starting suddely. When symptoms started they were lasting for a couple minutes at a time with associated nausea. She saw her PCP and states that she was treated with antibiotic for the sinus infection and also provided some exercises to do at home. Per PCP note pt was prescribed the Epley maneuver to perform at home. She reports no change from the exercises. She has a history of headaches for the last 6 months and states that currently she is getting daily headaches. They occur in frontal region bilaterally. Denies visual changes but reports photophobia, phonophobia, and sensitivity to smell. Pt denies every being diagnosed with migraines in the past. She also reports that her hair has been falling out recently. She reports high levels of "re-occurring stress." Pt also endorses feelings of lightheadedness and pt  states that she feel like she is going to pass out. Symptoms occur when she stands up too quickly. Pt states that she blacked out last week, fell, and hit her head on the dresser. She denies any serious injury from the fall. She has been having recurrent falls over the last few months.    Diagnostic tests  None reported by patient    Patient Stated Goals  Improve balance and resolve symptoms of vertigo    Currently in Pain?  --   History of fibromyalgia, unrelated to current episode        VESTIBULAR AND BALANCE  EVALUATION   HISTORY:  Subjective history of current problem: Pt referred by PCP for vertigo with concern for BPPV. Pt reports that she had a sinus infection around Christmas time 2020 followed by PNA and then started having vertigo. She does not recall the vertigo starting suddely. When symptoms started they were lasting for a couple minutes at a time with associated nausea. She saw her PCP and states that she was treated with antibiotic for the sinus infection and also provided some exercises to do at home. Per PCP note pt was prescribed the Epley maneuver to perform at home. She reports no change from the exercises. She has a history of headaches for the last 6 months and states that currently she is getting daily headaches. They occur in frontal region bilaterally. Denies visual changes but reports photophobia, phonophobia, and sensitivity to smell. Pt denies every being diagnosed with migraines in the past. She also reports that her hair has been falling out recently. She reports high levels of "re-occurring stress." Pt also endorses feelings of lightheadedness and pt states that she feel like she is going to pass out. Symptoms occur when she stands up too quickly. Pt states that she blacked out last week, fell, and hit her head on the dresser. She denies any serious injury from the fall. She has been having recurrent falls over the last few months.   Description of dizziness: (vertigo, unsteadiness, lightheadedness, falling, general unsteadiness, whoozy, swimmy-headed sensation, aural fullness) vertigo Frequency: Daily, multiple times/day  Duration: minutes Symptom nature: (motion provoked, positional, spontaneous, constant, variable, intermittent) motion-provoked, spontaneous?  Provocative Factors: movement but pt unsure which movements  Easing Factors:Dramamine, waiting for symptoms to pass  Progression of symptoms: (better, worse, no change since onset): worse History of similar  episodes:  Falls (yes/no): Yes Number of falls in past 6 months: "I can't even count"  Prior Functional Level: Fully independent and ambulates without assistive device   Auditory complaints (tinnitus, pain, drainage, hearing loss, aural fullness): Bilateral tinnitus which is new since onset, bilateral aural fullness, pt reports she feels like she is having some difficulty with her hearing Vision (diplopia, visual field loss, recent changes, last eye exam): Wears corrective lenses, "6 months overdue for vision check"  Red Flags: (dysarthria, dysphagia, drop attacks, bowel and bladder changes, recent weight loss/gain) Chronic dysphagia for years, pt reports she feels like she has been having difficulty saying what she wants to say since symptoms started. Otherwise review of systems negative for red flags.     EXAMINATION  POSTURE:  WNL  NEUROLOGICAL SCREEN: (2+ unless otherwise noted.) N=normal  Ab=abnormal  Level Dermatome R L Myotome R L Reflex R L  C3 Anterior Neck   Sidebend C2-3 N N Jaw CN V    C4 Top of Shoulder   Shoulder Shrug C4 N N Hoffman's UMN    C5 Lateral Upper Arm  Shoulder ABD C4-5 N N Biceps C5-6    C6 Lateral Arm/ Thumb   Arm Flex/ Wrist Ext C5-6 N N Brachiorad. C5-6    C7 Middle Finger   Arm Ext//Wrist Flex C6-7 N N Triceps C7    C8 4th & 5th Finger   Flex/ Ext Carpi Ulnaris C8 N N Patellar (L3-4)    T1 Medial Arm   Interossei T1 N N Gastrocnemius    L2 Medial thigh/groin   Illiopsoas (L2-3) N N     L3 Lower thigh/med.knee   Quadriceps (L3-4) N N     L4 Medial leg/lat thigh   Tibialis Ant (L4-5) N N     L5 Lat. leg & dorsal foot   EHL (L5) N N     S1 post/lat foot/thigh/leg   Gastrocnemius (S1-2) N N     S2 Post./med. thigh & leg   Hamstrings (L4-S3) N N      Reflex testing deferred   Cranial Nerves Visual acuity and visual fields are intact  Extraocular muscles are intact  Facial sensation is intact bilaterally  Facial strength is intact bilaterally   Hearing is normal as tested by gross conversation Shoulder shrug strength is intact  Tongue protrudes midline    SOMATOSENSORY:         Sensation           Intact      Diminished         Absent  Light touch Normal      COORDINATION: Finger to Nose: Slow and mildly innaccurate Heel to Shin: Slow and mildly discoordinated Pronator Drift: Negative Rapid Alternating Movements: Normal Finger to Thumb Opposition: Normal  MUSCULOSKELETAL SCREEN: Cervical Spine ROM: WFL and painless in all planes. No gross deficits identified   ROM: WNL  MMT: WNL  Functional Mobility: Independent for ambulation without assistive device     POSTURAL CONTROL TESTS:   Clinical Test of Sensory Interaction for Balance    (CTSIB):  CONDITION TIME SWAY  Eyes open, firm surface 30 seconds 1+  Eyes closed, firm surface 30 seconds 2+  Eyes open, foam surface 30 seconds 2+  Eyes closed, foam surface 2 seconds 4+    OCULOMOTOR / VESTIBULAR TESTING:  Oculomotor Exam- Room Light  Findings Comments  Ocular Alignment normal   Ocular ROM normal   Spontaneous Nystagmus normal   Gaze-Holding Nystagmus normal   End-Gaze Nystagmus normal   Vergence (normal 2-3") not examined   Smooth Pursuit abnormal Saccadic  Cross-Cover Test not examined   Saccades abnormal Consistently hypometric, very slow vertically  VOR Cancellation abnormal Pt reports dizziness and some saccades noted  Left Head Impulse normal   Right Head Impulse normal   Static Acuity not examined   Dynamic Acuity not examined     Oculomotor Exam- Fixation Suppressed  Findings Comments  Ocular Alignment normal   Spontaneous Nystagmus normal   Gaze-Holding Nystagmus normal   End-Gaze Nystagmus normal   Head Shaking Nystagmus normal   Pressure-Induced Nystagmus not examined   Hyperventilation Induced Nystagmus not examined   Skull Vibration Induced Nystagmus not examined     BPPV TESTS:  Symptoms Duration Intensity Nystagmus  L  Dix-Hallpike None   Downbeating  R Dix-Hallpike None   Downbeating  L Head Roll None   None  R Head Roll None   None  L Sidelying Test      R Sidelying Test        FUNCTIONAL OUTCOME MEASURES   Results Comments  DGI 18/24 Fall risk, in need of intervention  ABC Scale 16.3% Very low balance confidence  DHI 72/100 High perceived handicap  FOTO 33 Predicted improvement 66                             [facility] PT Assessment - 10/31/19 1051      Assessment   Medical Diagnosis  BPPV    Referring Provider (PT)  Dr. Netty Starring    Onset Date/Surgical Date  07/14/19   Approximate   Next MD Visit  Monday 11/03/19      Precautions   Precautions  Fall      Restrictions   Weight Bearing Restrictions  No      Balance Screen   Has the patient fallen in the past 6 months  Yes    How many times?  4    Has the patient had a decrease in activity level because of a fear of falling?   Yes    Is the patient reluctant to leave their home because of a fear of falling?   Yes      [place]  Private residence    Living Arrangements  Spouse/significant other    Available Help at Discharge  Family    Type of [place]      Prior Function   Level of Independence  Independent      Cognition   Overall Cognitive Status  Within Functional Limits for tasks assessed      Observation/Other Assessments   Focus on Therapeutic Outcomes (FOTO)   39      Standardized Balance Assessment   Standardized Balance Assessment  Dynamic Gait Index      Dynamic Gait Index   Level Surface  Normal    Change in Gait Speed  Normal    Gait with Horizontal Head Turns  Moderate Impairment    Gait with Vertical Head Turns  Moderate Impairment    Gait and Pivot Turn  Mild Impairment    Step Over Obstacle  Normal    Step Around Obstacles  Normal    Steps  Mild Impairment    Total Score  18                Objective measurements completed on examination:  See above findings.              PT Education - 10/31/19 1044    Education Details  Plan of care    Person(s) Educated  Patient    Methods  Explanation    Comprehension  Verbalized understanding       PT Short Term Goals - 10/31/19 1055      PT SHORT TERM GOAL #1   Title  Pt will be independent with HEP in order to improve strength and balance in order to decrease fall risk, decrease dizziness, and improve function at home    Time  6    Period  Weeks    Status  New    Target Date  12/12/19        PT Long Term Goals - 10/31/19 1056      PT LONG TERM GOAL #1   Title  Pt will improve DGI by at least 3 points in order to demonstrate clinically significant improvement in balance and decreased risk for falls.    Baseline  10/31/19: 18/24    Time  12  Period  Weeks    Status  New    Target Date  01/23/20      PT LONG TERM GOAL #2   Title  Pt will improve ABC by at least 13% in order to demonstrate clinically significant improvement in balance confidence.    Baseline  10/31/19: 16.3%    Time  12    Period  Weeks    Status  New    Target Date  01/23/20      PT LONG TERM GOAL #3   Title  Pt will decrease DHI score by at least 18 points in order to demonstrate clinically significant reduction in disability    Baseline  10/31/19: 72/100    Time  12    Period  Weeks    Status  New    Target Date  01/23/20      PT LONG TERM GOAL #4   Title  Pt will improve FOTO score to at least 66 in order to demonstrate clinically significant improvement in her function at home.    Baseline  10/31/19: 39    Time  12    Period  Weeks    Status  New    Target Date  01/23/20             Plan - 10/31/19 1047    Clinical Impression Statement  Pt is a pleasant 59 year-old female referred for concerns regarding vertigo and possible BPPV. Pt appears very anxious throughout evaluation and endorses multiple significant stressors in her life currently. She has been having vertigo  and significant imbalance since around Christmas 2020 which as a change from previous issues she had with imbalance. Symptoms occur both with movements but also occur spontaneously and she notes a lot of veering when she walks. Examination is negative for signs of BPPV with repeated negative Dix-Hallpike and Roll tests. However during Dix-Hallpike test she does have a persistent downbeating nystagmus bilaterally. Other central findings include saccadic smooth pursuits, consistently hypometric horizontal saccade testing, and reports of dizziness with saccades during VOR cancellation testing. Finger to nose and heel to shin are both slow and uncoordinated. She reports developing tremors over the last 8 months and states that she just started a new medication for this issue as prescribed by her psychiatrist. Of note she has also stopped taking her vitamin B12 supplements for multiple months now having run out of it and never buying more. She denies history of migraines but reports daily headaches over the last few months with sensitivity to light, sound, and smells. No reported imaging of brain and none found on file. Of note pt also reports recent syncopal episode last week with head trauma on the dresser. She has very low balance confidence scoring 16.3% on the ABC and demonstrates impairments with DGI of 18/24. She also has significant difficulty with mCTSIB falling within 2 seconds with eyes closed on foam. Pt presents with deficits in dizziness and balance and will benefit from skilled PT services to improve function and decrease risk for future falls.    Personal Factors and Comorbidities  Comorbidity 3+;Past/Current Experience    Comorbidities  Bipolar depression, anxiety, anemia, CHF, B12 deficiency, R ankle surgery, fibromyalgia, hypoparathyroidism    Examination-Activity Limitations  Locomotion Level;Stairs;Bend    [employer] Activity    Stability/Clinical Decision  Making  Unstable/Unpredictable    Clinical Decision Making  High    Rehab Potential  Fair    PT Frequency  1x / week  PT Duration  12 weeks    PT Treatment/Interventions  ADLs/Self Care Home Management;Aquatic Therapy;Biofeedback;Canalith Repostioning;Cryotherapy;Electrical Stimulation;Iontophoresis 4mg /ml Dexamethasone;Moist Heat;Traction;Ultrasound;DME Instruction;Gait training;Stair training;Functional mobility training;Therapeutic activities;Therapeutic exercise;Balance training;Neuromuscular re-education;Patient/family education;Manual techniques;Dry needling;Energy conservation;Vestibular;Joint Manipulations    PT Next Visit Plan  BERG, FGA, initiate VOR x 1 horizontal and issue for HEP    PT Home Exercise Plan  None currently    Consulted and Agree with Plan of Care  Patient       Patient will benefit from skilled therapeutic intervention in order to improve the following deficits and impairments:  Decreased balance, Dizziness  Visit Diagnosis: Dizziness and giddiness  Unsteadiness on feet     Problem List Patient Active Problem List   Diagnosis Date Noted  . Tremor 05/08/2019  . Bipolar disorder, in partial remission, most recent episode depressed ([place]) 12/30/2018  . B12 deficiency 11/22/2018  . Low iron 11/22/2018  . History of normocytic normochromic anemia 11/21/2018  . Iron deficiency anemia secondary to inadequate dietary iron intake 11/21/2018  . Multiple lung nodules on CT 04/19/2018  . Borderline diabetes mellitus 08/10/2017  . Paroxysmal supraventricular tachycardia ([place]) 09/27/2015  . Benign essential HTN 06/22/2015  . Combined fat and carbohydrate induced hyperlipemia 06/22/2015  . Breathlessness on exertion 06/17/2015  . Pure hypercholesterolemia 04/22/2015  . Congestive heart failure with left ventricular systolic dysfunction ([place]) 03/24/2015  . Acquired hypothyroidism 02/04/2015  . Heart failure, systolic ([place]) [id number]  . Anxiety 12/28/2014  .  Allergic rhinitis 10/27/2014  . Bipolar 1 disorder, depressed ([place]) 10/27/2014  . Hypercholesterolemia without hypertriglyceridemia 10/27/2014  . Depression, major, recurrent, moderate ([place]) 10/27/2014  . Adult hypothyroidism 10/27/2014  . GAD (generalized anxiety disorder) 10/27/2014  . Essential (primary) hypertension 10/27/2014  . Colon polyp 10/27/2014  . Hypoparathyroidism ([place]) 02/14/2013   Phillips Grout PT, DPT, GCS  Verlin Duke 10/31/2019, 11:19 AM  [place] MAIN [facility] SERVICES [address] [place], [place], [address] Phone: [phone]   Fax:  [phone]  Name: RIMSHA KLINK MRN: [id number] Date of Birth: 07/20/1960

## 2019-11-03 ENCOUNTER — Other Ambulatory Visit: Payer: Self-pay | Admitting: Psychiatry

## 2019-11-03 DIAGNOSIS — F411 Generalized anxiety disorder: Secondary | ICD-10-CM

## 2019-11-04 ENCOUNTER — Ambulatory Visit: Payer: BC Managed Care – PPO

## 2019-11-10 ENCOUNTER — Telehealth (INDEPENDENT_AMBULATORY_CARE_PROVIDER_SITE_OTHER): Payer: BC Managed Care – PPO | Admitting: Psychiatry

## 2019-11-10 ENCOUNTER — Encounter: Payer: Self-pay | Admitting: Psychiatry

## 2019-11-10 ENCOUNTER — Other Ambulatory Visit: Payer: Self-pay

## 2019-11-10 DIAGNOSIS — R251 Tremor, unspecified: Secondary | ICD-10-CM | POA: Diagnosis not present

## 2019-11-10 DIAGNOSIS — F3175 Bipolar disorder, in partial remission, most recent episode depressed: Secondary | ICD-10-CM

## 2019-11-10 DIAGNOSIS — F411 Generalized anxiety disorder: Secondary | ICD-10-CM

## 2019-11-10 MED ORDER — BENZTROPINE MESYLATE 0.5 MG PO TABS: ORAL_TABLET | ORAL | 0 refills | Status: DC

## 2019-11-10 MED ORDER — DULOXETINE HCL 60 MG PO CPEP: 60.0000 mg | ORAL_CAPSULE | Freq: Two times a day (BID) | ORAL | 0 refills | Status: DC

## 2019-11-10 MED ORDER — QUETIAPINE FUMARATE 100 MG PO TABS: ORAL_TABLET | ORAL | 0 refills | Status: DC

## 2019-11-10 NOTE — Progress Notes (Signed)
Provider Location : [facility] Patient Location : Home  Virtual Visit via Video Note  I connected with Kaylee Gordon on 11/10/19 at  2:20 PM EDT by a video enabled telemedicine application and verified that I am speaking with the correct person using two identifiers.   I discussed the limitations of evaluation and management by telemedicine and the availability of in person appointments. The patient expressed understanding and agreed to proceed.     I discussed the assessment and treatment plan with the patient. The patient was provided an opportunity to ask questions and all were answered. The patient agreed with the plan and demonstrated an understanding of the instructions.   The patient was advised to call back or seek an in-person evaluation if the symptoms worsen or if the condition fails to improve as anticipated.   [place] MD OP Progress Note  11/10/2019 3:05 PM AFFIE SEABRON  MRN:  [id number]  Chief Complaint:  Chief Complaint    Follow-up     HPI: Kaylee Gordon is a 59 year old Caucasian female who has a history of bipolar disorder, GAD, fibromyalgia, tremor, lives in [place], was evaluated by telemedicine today.  Patient today reports she continues to struggle with vertigo.  She was referred for physical therapy however her physical therapist felt she currently does not have vertigo and needs to be evaluated by her neurologist.  She however is currently waiting for a neurology visit.  Her appointment with neurologist is on May 19.  She reports her health problems are making her anxious or depressed.  She is compliant on medications as prescribed.  She reports sleep is good.  She however reports psychosocial stressors of her daughter graduating and starting grad school in [place].  This is a stressor for her since her daughter is moving away from her.  Patient became tearful when she discussed this.  Patient denies any suicidality, homicidality or perceptual  disturbances.  Patient denies any other concerns today. Visit Diagnosis:    ICD-10-CM   1. Bipolar disorder, in partial remission, most recent episode depressed (HCC)  F31.75 QUEtiapine (SEROQUEL) 100 MG tablet  2. GAD (generalized anxiety disorder)  F41.1   3. Tremor  R25.1 benztropine (COGENTIN) 0.5 MG tablet  4. GAD (generalized anxiety disorder) Active F41.1 DULoxetine (CYMBALTA) 60 MG capsule    Past Psychiatric History: I have reviewed past psychiatric history from my progress note on 09/11/2018  Past Medical History:  Past Medical History:  Diagnosis Date  . Anxiety   . Asthma   . Cancer [facility])    SKIN CANCER  . CHF (congestive heart failure) ([place])   . Depression   . Hypertension   . Thyroid disease     Past Surgical History:  Procedure Laterality Date  . ANKLE SURGERY Right 2012  . APPENDECTOMY    . AUGMENTATION MAMMAPLASTY Bilateral   . BREAST ENHANCEMENT SURGERY    . COLONOSCOPY WITH PROPOFOL N/A 02/22/2015   Procedure: COLONOSCOPY WITH PROPOFOL;  Surgeon: Josefine Class, MD;  Location: [facility] ENDOSCOPY;  Service: Endoscopy;  Laterality: N/A;  . ESOPHAGOGASTRODUODENOSCOPY (EGD) WITH PROPOFOL N/A 02/22/2015   Procedure: ESOPHAGOGASTRODUODENOSCOPY (EGD) WITH PROPOFOL;  Surgeon: Josefine Class, MD;  Location: [facility] ENDOSCOPY;  Service: Endoscopy;  Laterality: N/A;  . ESOPHAGOGASTRODUODENOSCOPY (EGD) WITH PROPOFOL N/A 02/11/2016   Procedure: ESOPHAGOGASTRODUODENOSCOPY (EGD) WITH PROPOFOL;  Surgeon: Manya Silvas, MD;  Location: [facility] ENDOSCOPY;  Service: Endoscopy;  Laterality: N/A;  . HEMORRHOID SURGERY    . NASAL SINUS SURGERY    .  SAVORY DILATION N/A 02/22/2015   Procedure: SAVORY DILATION;  Surgeon: Josefine Class, MD;  Location: [facility] ENDOSCOPY;  Service: Endoscopy;  Laterality: N/A;  . SAVORY DILATION N/A 02/11/2016   Procedure: SAVORY DILATION;  Surgeon: Manya Silvas, MD;  Location: [facility] ENDOSCOPY;  Service: Endoscopy;  Laterality: N/A;  . SKIN CANCER  EXCISION      Family Psychiatric History: Reviewed family psychiatric history from my progress note on 09/11/2018  Family History:  Family History  Problem Relation Age of Onset  . Cancer Mother   . Depression Mother   . Anxiety disorder Mother   . Heart disease Father   . Cancer Father   . Anxiety disorder Father   . Depression Father   . Hypoparathyroidism Son     Social History: Reviewed social history from my progress note on 09/11/2018 Social History   Socioeconomic History  . Marital status: Married    Spouse name: Kaylee Gordon  . Number of children: Not on file  . Years of education: Not on file  . Highest education level: Not on file  Occupational History  . Not on file  Tobacco Use  . Smoking status: Never Smoker  . Smokeless tobacco: Never Used  Substance and Sexual Activity  . Alcohol use: No    Alcohol/week: 0.0 standard drinks    Comment: MAYBE ONCE OR TWICE A MONTH  . Drug use: No  . Sexual activity: Not Currently    Partners: Male  Other Topics Concern  . Not on file  Social History Narrative  . Not on file   Social Determinants of Health   Financial Resource Strain:   . Difficulty of Paying Living Expenses:   Food Insecurity:   . Worried About Charity fundraiser in the Last Year:   . Arboriculturist in the Last Year:   Transportation Needs:   . Film/video editor (Medical):   Marland Kitchen Lack of Transportation (Non-Medical):   Physical Activity:   . Days of Exercise per Week:   . Minutes of Exercise per Session:   Stress:   . Feeling of Stress :   Social Connections:   . Frequency of Communication with Friends and Family:   . Frequency of Social Gatherings with Friends and Family:   . Attends Religious Services:   . Active Member of Clubs or Organizations:   . Attends Archivist Meetings:   Marland Kitchen Marital Status:     Allergies:  Allergies  Allergen Reactions  . Atorvastatin   . Hydrochlorothiazide Other (See Comments)    Intolerance -  dry lips  . Parafon Forte Dsc [Chlorzoxazone]   . Pollen Extract   . Sudafed [Pseudoephedrine Hcl]     Heart racing    Metabolic Disorder Labs: No results found for: HGBA1C, MPG Lab Results  Component Value Date   PROLACTIN 18.3 07/22/2014   No results found for: CHOL, TRIG, HDL, CHOLHDL, VLDL, LDLCALC Lab Results  Component Value Date   TSH 3.66 05/22/2019   TSH 1.76 11/15/2018    Therapeutic Level Labs: No results found for: LITHIUM Lab Results  Component Value Date   VALPROATE 39 (L) 02/04/2019   No components found for:  CBMZ  Current Medications: Current Outpatient Medications  Medication Sig Dispense Refill  . gabapentin (NEURONTIN) 300 MG capsule TAKE TWO CAPSULES BY MOUTH EVERY MORNING THEN ONE CAPSULE DAILY AT MIDDAY THEN TWO CAPSULES EVERY EVENING    . azelastine (ASTELIN) 0.1 % nasal spray Place into the  nose.    . benztropine (COGENTIN) 0.5 MG tablet Daily as needed for tremors 30 tablet 0  . calcitRIOL (ROCALTROL) 0.25 MCG capsule TAKE ONE CAPSULE BY MOUTH DAILY 30 capsule 1  . doxycycline (VIBRA-TABS) 100 MG tablet Take 100 mg by mouth 2 (two) times daily.    . DULoxetine (CYMBALTA) 60 MG capsule Take 1 capsule (60 mg total) by mouth 2 (two) times daily. 60 capsule 0  . ferrous sulfate 325 (65 FE) MG tablet Take by mouth.    . fluticasone (FLONASE) 50 MCG/ACT nasal spray     . gabapentin (NEURONTIN) 300 MG capsule TAKE TWO CAPSULES BY MOUTH EVERY MORNING; THEN ONE CAPSULE BY MOUTH MIDDAY; THEN TAKE TWO CAPSULES BY MOUTH EVERY EVENING    . guaiFENesin-codeine 100-10 MG/5ML syrup Take by mouth.    . hydrOXYzine (VISTARIL) 50 MG capsule Take 1 capsule (50 mg total) by mouth 2 (two) times daily as needed for anxiety. 60 capsule 1  . hyoscyamine (LEVSIN, ANASPAZ) 0.125 MG tablet Take by mouth.    . levofloxacin (LEVAQUIN) 500 MG tablet Take 500 mg by mouth daily.    Marland Kitchen levothyroxine (SYNTHROID) 50 MCG tablet TAKE ONE TABLET BY MOUTH DAILY BEFORE BREAKFAST 90 tablet  0  . lisinopril (ZESTRIL) 10 MG tablet Take 10 mg by mouth daily.    Marland Kitchen lovastatin (MEVACOR) 20 MG tablet Take 20 mg by mouth at bedtime.    . ondansetron (ZOFRAN-ODT) 4 MG disintegrating tablet     . pantoprazole (PROTONIX) 40 MG tablet Take 1 tablet (40 mg total) by mouth daily. 30 tablet 1  . predniSONE (DELTASONE) 10 MG tablet 4 tabs po daily x 3 days, 3 tabs po daily x 3 days, 2 tabs po daily x 3 days, 1 tab po daily x 3 days    . propranolol (INDERAL) 20 MG tablet Take 20 mg by mouth 2 (two) times daily.     . propranolol (INNOPRAN XL) 80 MG 24 hr capsule Take by mouth.    . propranolol ER (INDERAL LA) 80 MG 24 hr capsule Take 80 mg by mouth daily.    . QUEtiapine (SEROQUEL) 100 MG tablet TAKE TWO TABLETS BY MOUTH EVERY NIGHT AT BEDTIME 60 tablet 0  . vitamin B-12 (CYANOCOBALAMIN) 1000 MCG tablet Take by mouth.     No current facility-administered medications for this visit.     Musculoskeletal: Strength & Muscle Tone: UTA Gait & Station: normal Patient leans: N/A  Psychiatric Specialty Exam: Review of Systems  Neurological: Positive for dizziness.  Psychiatric/Behavioral: Positive for dysphoric mood. Negative for self-injury, sleep disturbance and suicidal ideas. The patient is nervous/anxious.   All other systems reviewed and are negative.   There were no vitals taken for this visit.There is no height or weight on file to calculate BMI.  General Appearance: Casual  Eye Contact:  Fair  Speech:  Clear and Coherent  Volume:  Normal  Mood:  Anxious and Dysphoric  Affect:  Congruent  Thought Process:  Goal Directed and Descriptions of Associations: Intact  Orientation:  Full (Time, Place, and Person)  Thought Content: Logical   Suicidal Thoughts:  No  Homicidal Thoughts:  No  Memory:  Immediate;   Fair Recent;   Fair Remote;   Fair  Judgement:  Fair  Insight:  Fair  Psychomotor Activity:  Normal  Concentration:  Concentration: Fair and Attention Span: Fair  Recall:   [employer] of Knowledge: Fair  Language: Fair  Akathisia:  No  Handed:  Right  AIMS (if indicated):UTA  Assets:  Communication Skills Desire for Improvement Housing Social Support  ADL's:  Intact  Cognition: WNL  Sleep:  Fair   Screenings:   Assessment and Plan: Demica is a 59 year old Caucasian female, married, lives in [place], has a history of bipolar disorder, anxiety disorder, fibromyalgia, IBS was evaluated by telemedicine today.  Patient continues to struggle with vertigo which does affect her mood.  Patient also with psychosocial stressors of her daughter moving away for college.  Patient will benefit from medications as well as psychotherapy sessions.  Plan Bipolar disorder in partial remission Seroquel 200 mg p.o. nightly Hydroxyzine 50 mg p.o. twice daily as needed for anxiety She is also on gabapentin prescribed for pain which is a mood stabilizer.  GAD-some progress Patient advised to reach out to her therapist to start more frequent psychotherapy sessions.  Will not make more medication changes today since she struggles with dizziness.  She has upcoming appointment with neurology and will wait until neurology clearance. Hydroxyzine 50 mg p.o. twice daily as needed for severe anxiety attacks Patient advised to limit use of hydroxyzine. Cymbalta 60 mg p.o. twice daily  Tremors-improving Continue Cogentin as needed  Follow-up in clinic in 4 weeks or sooner if needed.  I have spent atleast 20 minutes non face to face with patient today. More than 50 % of the time was spent for preparing to see the patient ( e.g., review of test, records ),ordering medications and test ,psychoeducation and supportive psychotherapy and care coordination,as well as documenting clinical information in electronic health record. This note was generated in part or whole with voice recognition software. Voice recognition is usually quite accurate but there are transcription errors that can  and very often do occur. I apologize for any typographical errors that were not detected and corrected.      Ursula Alert, MD 11/10/2019, 3:05 PM

## 2019-11-14 ENCOUNTER — Ambulatory Visit: Payer: BC Managed Care – PPO

## 2019-11-18 ENCOUNTER — Ambulatory Visit: Payer: BC Managed Care – PPO

## 2019-11-19 ENCOUNTER — Other Ambulatory Visit: Payer: Self-pay

## 2019-11-20 ENCOUNTER — Ambulatory Visit: Payer: BC Managed Care – PPO | Admitting: Endocrinology

## 2019-12-03 ENCOUNTER — Other Ambulatory Visit: Payer: Self-pay | Admitting: Endocrinology

## 2019-12-04 ENCOUNTER — Other Ambulatory Visit: Payer: Self-pay | Admitting: Neurology

## 2019-12-04 DIAGNOSIS — R4189 Other symptoms and signs involving cognitive functions and awareness: Secondary | ICD-10-CM

## 2019-12-04 DIAGNOSIS — R2689 Other abnormalities of gait and mobility: Secondary | ICD-10-CM

## 2019-12-05 ENCOUNTER — Other Ambulatory Visit: Payer: Self-pay

## 2019-12-08 ENCOUNTER — Ambulatory Visit: Payer: BC Managed Care – PPO | Admitting: Endocrinology

## 2019-12-11 ENCOUNTER — Telehealth (INDEPENDENT_AMBULATORY_CARE_PROVIDER_SITE_OTHER): Payer: BC Managed Care – PPO | Admitting: Psychiatry

## 2019-12-11 ENCOUNTER — Other Ambulatory Visit: Payer: Self-pay

## 2019-12-11 ENCOUNTER — Encounter: Payer: Self-pay | Admitting: Psychiatry

## 2019-12-11 DIAGNOSIS — F411 Generalized anxiety disorder: Secondary | ICD-10-CM | POA: Diagnosis not present

## 2019-12-11 DIAGNOSIS — R251 Tremor, unspecified: Secondary | ICD-10-CM

## 2019-12-11 DIAGNOSIS — F3175 Bipolar disorder, in partial remission, most recent episode depressed: Secondary | ICD-10-CM | POA: Diagnosis not present

## 2019-12-11 NOTE — Progress Notes (Signed)
Provider Location : [facility] Patient Location : Home  Virtual Visit via Telephone Note  I connected with Kaylee Gordon on 12/11/19 at  1:30 PM EDT by telephone and verified that I am speaking with the correct person using two identifiers.   I discussed the limitations, risks, security and privacy concerns of performing an evaluation and management service by telephone and the availability of in person appointments. I also discussed with the patient that there may be a patient responsible charge related to this service. The patient expressed understanding and agreed to proceed.      I discussed the assessment and treatment plan with the patient. The patient was provided an opportunity to ask questions and all were answered. The patient agreed with the plan and demonstrated an understanding of the instructions.   The patient was advised to call back or seek an in-person evaluation if the symptoms worsen or if the condition fails to improve as anticipated.   [place] MD OP Progress Note  12/11/2019 6:46 PM Kaylee Gordon  MRN:  [id number]  Chief Complaint:  Chief Complaint    Follow-up     HPI: Kaylee Gordon is a 59 year old Caucasian female who has a history of bipolar disorder, GAD, fibromyalgia, tremors, lives in [place], was evaluated by phone today.  Due to system wide connection problem video could not be initiated.  Patient today reports she is currently struggling with gait instability.  She had recent evaluation by her neurologist and she is currently scheduled for an MRI.  She reports even though she was struggling with pain and other problems her medications were not readjusted since they want the MRI first.  Patient reports because of the health issues she is currently struggling with a lot of anxiety.  She reports her mood continues to be up and down.  She also reports having these ' brain fog', on and off.  She does not know what causes it.  Patient continues to follow-up  with her therapist Ms. Miguel Dibble.  She reports she also makes use of the CALM app which helps her with her anxiety.  She also has hydroxyzine as needed which has been helpful.  Patient denies any suicidality, homicidality or perceptual disturbances.  Patient denies any other concerns today.  Visit Diagnosis:    ICD-10-CM   1. Bipolar disorder, in partial remission, most recent episode depressed ([place])  F31.75   2. GAD (generalized anxiety disorder)  F41.1   3. Tremor  R25.1     Past Psychiatric History: I have reviewed past psychiatric history from my progress note on 09/11/2018  Past Medical History:  Past Medical History:  Diagnosis Date  . Anxiety   . Asthma   . Cancer [facility])    SKIN CANCER  . CHF (congestive heart failure) ([place])   . Depression   . Hypertension   . Thyroid disease     Past Surgical History:  Procedure Laterality Date  . ANKLE SURGERY Right 2012  . APPENDECTOMY    . AUGMENTATION MAMMAPLASTY Bilateral   . BREAST ENHANCEMENT SURGERY    . COLONOSCOPY WITH PROPOFOL N/A 02/22/2015   Procedure: COLONOSCOPY WITH PROPOFOL;  Surgeon: Josefine Class, MD;  Location: [facility] ENDOSCOPY;  Service: Endoscopy;  Laterality: N/A;  . ESOPHAGOGASTRODUODENOSCOPY (EGD) WITH PROPOFOL N/A 02/22/2015   Procedure: ESOPHAGOGASTRODUODENOSCOPY (EGD) WITH PROPOFOL;  Surgeon: Josefine Class, MD;  Location: [facility] ENDOSCOPY;  Service: Endoscopy;  Laterality: N/A;  . ESOPHAGOGASTRODUODENOSCOPY (EGD) WITH PROPOFOL N/A 02/11/2016   Procedure:  ESOPHAGOGASTRODUODENOSCOPY (EGD) WITH PROPOFOL;  Surgeon: Manya Silvas, MD;  Location: [facility] ENDOSCOPY;  Service: Endoscopy;  Laterality: N/A;  . HEMORRHOID SURGERY    . NASAL SINUS SURGERY    . SAVORY DILATION N/A 02/22/2015   Procedure: SAVORY DILATION;  Surgeon: Josefine Class, MD;  Location: [facility] ENDOSCOPY;  Service: Endoscopy;  Laterality: N/A;  . SAVORY DILATION N/A 02/11/2016   Procedure: SAVORY DILATION;  Surgeon: Manya Silvas, MD;   Location: [facility] ENDOSCOPY;  Service: Endoscopy;  Laterality: N/A;  . SKIN CANCER EXCISION      Family Psychiatric History: I have reviewed family psychiatric history from my progress note on 09/11/2018  Family History:  Family History  Problem Relation Age of Onset  . Cancer Mother   . Depression Mother   . Anxiety disorder Mother   . Heart disease Father   . Cancer Father   . Anxiety disorder Father   . Depression Father   . Hypoparathyroidism Son     Social History: I have reviewed social history from my progress note on 09/11/2018 Social History   Socioeconomic History  . Marital status: Married    Spouse name: Legrand Como  . Number of children: Not on file  . Years of education: Not on file  . Highest education level: Not on file  Occupational History  . Not on file  Tobacco Use  . Smoking status: Never Smoker  . Smokeless tobacco: Never Used  Substance and Sexual Activity  . Alcohol use: No    Alcohol/week: 0.0 standard drinks    Comment: MAYBE ONCE OR TWICE A MONTH  . Drug use: No  . Sexual activity: Not Currently    Partners: Male  Other Topics Concern  . Not on file  Social History Narrative  . Not on file   Social Determinants of Health   Financial Resource Strain:   . Difficulty of Paying Living Expenses:   Food Insecurity:   . Worried About Charity fundraiser in the Last Year:   . Arboriculturist in the Last Year:   Transportation Needs:   . Film/video editor (Medical):   Marland Kitchen Lack of Transportation (Non-Medical):   Physical Activity:   . Days of Exercise per Week:   . Minutes of Exercise per Session:   Stress:   . Feeling of Stress :   Social Connections:   . Frequency of Communication with Friends and Family:   . Frequency of Social Gatherings with Friends and Family:   . Attends Religious Services:   . Active Member of Clubs or Organizations:   . Attends Archivist Meetings:   Marland Kitchen Marital Status:     Allergies:  Allergies   Allergen Reactions  . Atorvastatin   . Hydrochlorothiazide Other (See Comments)    Intolerance - dry lips  . Parafon Forte Dsc [Chlorzoxazone]   . Pollen Extract   . Sudafed [Pseudoephedrine Hcl]     Heart racing    Metabolic Disorder Labs: No results found for: HGBA1C, MPG Lab Results  Component Value Date   PROLACTIN 18.3 07/22/2014   No results found for: CHOL, TRIG, HDL, CHOLHDL, VLDL, LDLCALC Lab Results  Component Value Date   TSH 3.66 05/22/2019   TSH 1.76 11/15/2018    Therapeutic Level Labs: No results found for: LITHIUM Lab Results  Component Value Date   VALPROATE 39 (L) 02/04/2019   No components found for:  CBMZ  Current Medications: Current Outpatient Medications  Medication Sig Dispense  Refill  . azelastine (ASTELIN) 0.1 % nasal spray Place into the nose.    . benztropine (COGENTIN) 0.5 MG tablet Daily as needed for tremors 30 tablet 0  . calcitRIOL (ROCALTROL) 0.25 MCG capsule TAKE ONE CAPSULE BY MOUTH DAILY 30 capsule 0  . doxycycline (VIBRA-TABS) 100 MG tablet Take 100 mg by mouth 2 (two) times daily.    . DULoxetine (CYMBALTA) 60 MG capsule Take 1 capsule (60 mg total) by mouth 2 (two) times daily. 60 capsule 0  . ferrous sulfate 325 (65 FE) MG tablet Take by mouth.    . fluticasone (FLONASE) 50 MCG/ACT nasal spray     . gabapentin (NEURONTIN) 300 MG capsule TAKE TWO CAPSULES BY MOUTH EVERY MORNING; THEN ONE CAPSULE BY MOUTH MIDDAY; THEN TAKE TWO CAPSULES BY MOUTH EVERY EVENING    . gabapentin (NEURONTIN) 300 MG capsule TAKE TWO CAPSULES BY MOUTH EVERY MORNING THEN ONE CAPSULE DAILY AT MIDDAY THEN TWO CAPSULES EVERY EVENING    . guaiFENesin-codeine 100-10 MG/5ML syrup Take by mouth.    . hydrOXYzine (VISTARIL) 50 MG capsule Take 1 capsule (50 mg total) by mouth 2 (two) times daily as needed for anxiety. 60 capsule 1  . hyoscyamine (LEVSIN, ANASPAZ) 0.125 MG tablet Take by mouth.    . levofloxacin (LEVAQUIN) 500 MG tablet Take 500 mg by mouth daily.     Marland Kitchen levothyroxine (SYNTHROID) 50 MCG tablet TAKE ONE TABLET BY MOUTH DAILY BEFORE BREAKFAST 90 tablet 0  . lisinopril (ZESTRIL) 10 MG tablet Take 10 mg by mouth daily.    Marland Kitchen lovastatin (MEVACOR) 20 MG tablet Take 20 mg by mouth at bedtime.    . ondansetron (ZOFRAN-ODT) 4 MG disintegrating tablet     . pantoprazole (PROTONIX) 40 MG tablet Take 1 tablet (40 mg total) by mouth daily. 30 tablet 1  . predniSONE (DELTASONE) 10 MG tablet 4 tabs po daily x 3 days, 3 tabs po daily x 3 days, 2 tabs po daily x 3 days, 1 tab po daily x 3 days    . propranolol (INDERAL) 20 MG tablet Take 20 mg by mouth 2 (two) times daily.     . propranolol (INNOPRAN XL) 80 MG 24 hr capsule Take by mouth.    . propranolol ER (INDERAL LA) 80 MG 24 hr capsule Take 80 mg by mouth daily.    . QUEtiapine (SEROQUEL) 100 MG tablet TAKE TWO TABLETS BY MOUTH EVERY NIGHT AT BEDTIME 60 tablet 0  . vitamin B-12 (CYANOCOBALAMIN) 1000 MCG tablet Take by mouth.     No current facility-administered medications for this visit.     Musculoskeletal: Strength & Muscle Tone: UTA Gait & Station: UTA Patient leans: N/A  Psychiatric Specialty Exam: Review of Systems  Musculoskeletal: Positive for gait problem.  Psychiatric/Behavioral: The patient is nervous/anxious.   All other systems reviewed and are negative.   There were no vitals taken for this visit.There is no height or weight on file to calculate BMI.  General Appearance: UTA  Eye Contact:  UTA  Speech:  Clear and Coherent  Volume:  Normal  Mood:  Anxious  Affect:  UTA  Thought Process:  Goal Directed and Descriptions of Associations: Intact  Orientation:  Full (Time, Place, and Person)  Thought Content: Logical   Suicidal Thoughts:  No  Homicidal Thoughts:  No  Memory:  Immediate;   Fair Recent;   Fair Remote;   Fair  Judgement:  Fair  Insight:  Fair  Psychomotor Activity:  UTA  Concentration:  Concentration: Fair and Attention Span: Fair  Recall:  [employer] of  Knowledge: Fair  Language: Fair  Akathisia:  No  Handed:  Right  AIMS (if indicated): UTA  Assets:  Communication Skills Desire for Improvement Housing Social Support  ADL's:  Intact  Cognition: WNL  Sleep:  Fair   Screenings:   Assessment and Plan: Kaylee Gordon is a 59 year old Caucasian female, married, lives in [place], has a history of bipolar disorder, anxiety disorder, fibromyalgia, IBS was evaluated by phone today.  Patient is currently struggling with gait instability and cognitive problems.  Patient is currently under the care of neurology and has upcoming imaging scheduled.  She does have anxiety and mood swings and will continue to benefit from medication management and psychotherapy sessions.  Discussed plan as noted below.  Plan Bipolar disorder in partial remission Seroquel 200 mg p.o. nightly Hydroxyzine 50 mg p.o. twice daily as needed for anxiety. Gabapentin prescribed for pain which is also a mood stabilizer.  GAD-some improvement Continue Cymbalta 60 mg p.o. twice daily Hydroxyzine 50 mg p.o. daily as needed for severe anxiety symptoms-patient advised to limit use  Tremors-improving Continue Cogentin as needed  Patient with gait instability and cognitive issues, will continue to follow-up with neurology.  She has upcoming MRI scheduled.  Discussed with patient that her cognitive issues likely also could be secondary to her medications.  Patient however does not want to make any medication dosage reduction at this time.  She will continue to make use of psychotherapy sessions to manage her anxiety.  Follow-up in clinic in 1 month or sooner if needed.  I have spent atleast 20 minutes non face to face with patient today. More than 50 % of the time was spent for preparing to see the patient ( e.g., review of test, records ), obtaining and to review and separately obtained history , ordering medications and test ,psychoeducation and supportive psychotherapy  and care coordination,as well as documenting clinical information in electronic health record. This note was generated in part or whole with voice recognition software. Voice recognition is usually quite accurate but there are transcription errors that can and very often do occur. I apologize for any typographical errors that were not detected and corrected.        Ursula Alert, MD 12/11/2019, 6:46 PM

## 2019-12-16 ENCOUNTER — Telehealth: Payer: BC Managed Care – PPO | Admitting: Psychiatry

## 2019-12-17 ENCOUNTER — Other Ambulatory Visit: Payer: Self-pay

## 2019-12-17 ENCOUNTER — Ambulatory Visit: Payer: Self-pay | Admitting: Endocrinology

## 2019-12-17 ENCOUNTER — Encounter: Payer: Self-pay | Admitting: Endocrinology

## 2019-12-17 VITALS — BP 124/70 | HR 85 | Ht 64.0 in | Wt 178.4 lb

## 2019-12-17 DIAGNOSIS — E200 Idiopathic hypoparathyroidism: Secondary | ICD-10-CM

## 2019-12-17 DIAGNOSIS — E038 Other specified hypothyroidism: Secondary | ICD-10-CM

## 2019-12-17 NOTE — Progress Notes (Signed)
Patient ID: Kaylee Gordon, female   DOB: 05-24-1961, 59 y.o.   MRN: [id number]    History of Present Illness:  Chief complaint: F/u    Hypothyroidism:  She was initially given thyroxine supplement with a borderline TSH levels in 2011 However because of complaints of  fatigue on her visit in 10/15 she had thyroid levels done and free T4 was significantly low She was empirically started on 25 g  of levothyroxine Initially she felt less tired with this; however her free T4 continue to be low and her dose has been increased to 75 g since 1/16 when her free T4 was still low at 0.59 She had a low TSH in 11/16 and since then has been taking 50 mcg daily, this was changed by the PCP  In 10/2017 she was found to have a low free T4 and was told to take 75 mcg of levothyroxine However for some reason she did not change her dose and is continuing to take 50 mcg  She feels increasingly fatigued over the last 3 weeks or so, previously was having more intermittent fatigue Also having difficulty sleeping She may sometimes may feel cold  She is taking her levothyroxine daily in the morning on empty stomach . She takes her calcium in the evening after dinner Recent TSH from PCP was over 4.0 but free T4 not done   Wt Readings from Last 3 Encounters:  12/17/19 178 lb 6.4 oz (80.9 kg)  05/22/19 173 lb (78.5 kg)  12/12/18 180 lb 9.6 oz (81.9 kg)    Lab Results  Component Value Date   FREET4 0.84 05/22/2019   FREET4 0.78 11/15/2018   FREET4 0.91 04/03/2018   TSH 3.66 05/22/2019   TSH 1.76 11/15/2018   TSH 1.68 04/03/2018    Hypoparathyroidism    Has had hypocalcemia since she was 59 years old and presented with symptoms of cramping in her hands along with twitching of her face and eyes. She was diagnosed at [facility] as having primary idiopathic hypoparathyroidism.   Not complaining of of twitching or tingling in her face, muscle cramping in her hands. She was having  some of the symptoms on her last visit in August but no etiology found, possibly from HCTZ  She is taking calcitriol 0.25 mcg daily in the morning and also her calcium tablet which has 1200 mg once daily after dinner She takes these very regularly  Last calcium normal done last month from PCP and was 9.0  Lab Results  Component Value Date   CALCIUM 9.3 05/22/2019   CALCIUM 9.4 03/05/2019   CALCIUM 8.6 11/15/2018   CALCIUM 9.3 04/03/2018   CALCIUM 9.4 10/29/2017   Lab Results  Component Value Date   K 3.9 05/22/2019      Allergies as of 12/17/2019      Reactions   Atorvastatin    Hydrochlorothiazide Other (See Comments)   Intolerance - dry lips   Parafon Forte Dsc [chlorzoxazone]    Pollen Extract    Sudafed [pseudoephedrine Hcl]    Heart racing      Medication List       Accurate as of December 17, 2019  3:30 PM. If you have any questions, ask your nurse or doctor.        STOP taking these medications   benztropine 0.5 MG tablet Commonly known as: COGENTIN Stopped by: Elayne Snare, MD   doxycycline 100 MG tablet Commonly  known as: VIBRA-TABS Stopped by: Elayne Snare, MD   ferrous sulfate 325 (65 FE) MG tablet Stopped by: Elayne Snare, MD   guaiFENesin-codeine 100-10 MG/5ML syrup Stopped by: Elayne Snare, MD   hyoscyamine 0.125 MG tablet Commonly known as: LEVSIN Stopped by: Elayne Snare, MD   levofloxacin 500 MG tablet Commonly known as: LEVAQUIN Stopped by: Elayne Snare, MD   predniSONE 10 MG tablet Commonly known as: DELTASONE Stopped by: Elayne Snare, MD   propranolol 20 MG tablet Commonly known as: INDERAL Stopped by: Elayne Snare, MD   propranolol ER 80 MG 24 hr capsule Commonly known as: INDERAL LA Stopped by: Elayne Snare, MD     TAKE these medications   azelastine 0.1 % nasal spray Commonly known as: ASTELIN Place into the nose.   calcitRIOL 0.25 MCG capsule Commonly known as: ROCALTROL TAKE ONE CAPSULE BY MOUTH DAILY   DULoxetine 60 MG  capsule Commonly known as: CYMBALTA Take 1 capsule (60 mg total) by mouth 2 (two) times daily.   fluticasone 50 MCG/ACT nasal spray Commonly known as: FLONASE   gabapentin 300 MG capsule Commonly known as: NEURONTIN TAKE TWO CAPSULES BY MOUTH EVERY MORNING THEN ONE CAPSULE DAILY AT MIDDAY THEN TWO CAPSULES EVERY EVENING What changed: Another medication with the same name was removed. Continue taking this medication, and follow the directions you see here. Changed by: Elayne Snare, MD   hydrOXYzine 50 MG capsule Commonly known as: Vistaril Take 1 capsule (50 mg total) by mouth 2 (two) times daily as needed for anxiety.   levothyroxine 50 MCG tablet Commonly known as: SYNTHROID TAKE ONE TABLET BY MOUTH DAILY BEFORE BREAKFAST   lisinopril 10 MG tablet Commonly known as: ZESTRIL Take 10 mg by mouth daily.   lovastatin 20 MG tablet Commonly known as: MEVACOR Take 20 mg by mouth at bedtime.   ondansetron 4 MG disintegrating tablet Commonly known as: ZOFRAN-ODT   pantoprazole 40 MG tablet Commonly known as: PROTONIX Take 1 tablet (40 mg total) by mouth daily.   propranolol 80 MG 24 hr capsule Commonly known as: INNOPRAN XL Take by mouth.   QUEtiapine 100 MG tablet Commonly known as: SEROQUEL TAKE TWO TABLETS BY MOUTH EVERY NIGHT AT BEDTIME   vitamin B-12 1000 MCG tablet Commonly known as: CYANOCOBALAMIN Take by mouth.       Allergies:  Allergies  Allergen Reactions  . Atorvastatin   . Hydrochlorothiazide Other (See Comments)    Intolerance - dry lips  . Parafon Forte Dsc [Chlorzoxazone]   . Pollen Extract   . Sudafed [Pseudoephedrine Hcl]     Heart racing    Past Medical History:  Diagnosis Date  . Anxiety   . Asthma   . Cancer [facility])    SKIN CANCER  . CHF (congestive heart failure) ([place])   . Depression   . Hypertension   . Thyroid disease     Past Surgical History:  Procedure Laterality Date  . ANKLE SURGERY Right 2012  . APPENDECTOMY    .  AUGMENTATION MAMMAPLASTY Bilateral   . BREAST ENHANCEMENT SURGERY    . COLONOSCOPY WITH PROPOFOL N/A 02/22/2015   Procedure: COLONOSCOPY WITH PROPOFOL;  Surgeon: Josefine Class, MD;  Location: [facility] ENDOSCOPY;  Service: Endoscopy;  Laterality: N/A;  . ESOPHAGOGASTRODUODENOSCOPY (EGD) WITH PROPOFOL N/A 02/22/2015   Procedure: ESOPHAGOGASTRODUODENOSCOPY (EGD) WITH PROPOFOL;  Surgeon: Josefine Class, MD;  Location: [facility] ENDOSCOPY;  Service: Endoscopy;  Laterality: N/A;  . ESOPHAGOGASTRODUODENOSCOPY (EGD) WITH PROPOFOL N/A 02/11/2016   Procedure: ESOPHAGOGASTRODUODENOSCOPY (  EGD) WITH PROPOFOL;  Surgeon: Manya Silvas, MD;  Location: [facility] ENDOSCOPY;  Service: Endoscopy;  Laterality: N/A;  . HEMORRHOID SURGERY    . NASAL SINUS SURGERY    . SAVORY DILATION N/A 02/22/2015   Procedure: SAVORY DILATION;  Surgeon: Josefine Class, MD;  Location: [facility] ENDOSCOPY;  Service: Endoscopy;  Laterality: N/A;  . SAVORY DILATION N/A 02/11/2016   Procedure: SAVORY DILATION;  Surgeon: Manya Silvas, MD;  Location: [facility] ENDOSCOPY;  Service: Endoscopy;  Laterality: N/A;  . SKIN CANCER EXCISION      Family History  Problem Relation Age of Onset  . Cancer Mother   . Depression Mother   . Anxiety disorder Mother   . Heart disease Father   . Cancer Father   . Anxiety disorder Father   . Depression Father   . Hypoparathyroidism Son     Social History:  reports that she has never smoked. She has never used smokeless tobacco. She reports that she does not drink alcohol or use drugs.  Review of Systems    History of depression, is on duloxetine, gabapentin and Seroquel long-term  She takes vitamin B12 supplements, last B12 level normal  EXAM:  BP 124/70 (BP Location: Left Arm, Patient Position: Sitting, Cuff Size: Normal)   Pulse 85   Ht 5\' 4"  (1.626 m)   Wt 178 lb 6.4 oz (80.9 kg)   SpO2 97%   BMI 30.62 kg/m    She looks well, somewhat anxious No swelling of the eyes  No tremor Biceps  reflexes are slightly brisk  Assessment/Plan:    Hypoparathyroidism: Her symptoms are well controlled with 0.25 mcg of calcitriol and only one calcium supplement daily Recent calcium normal  Secondary HYPOTHYROIDISM: She has had increasing fatigue but no other symptoms suggestive of hypothyroidism No previous history of goiter Currently only on 50 mcg levothyroxine; recent TSH was upper normal from PCP  Free T4 not available and will be checked Reminded her to take her levothyroxine on empty stomach and calcium at least couple of hours later   Elayne Snare 12/17/2019, 3:30 PM

## 2019-12-18 LAB — T4, FREE: Free T4: 0.7 ng/dL (ref 0.60–1.60)

## 2019-12-18 LAB — TSH: TSH: 3.95 u[IU]/mL (ref 0.35–4.50)

## 2019-12-18 LAB — CALCIUM: Calcium: 9.4 mg/dL (ref 8.4–10.5)

## 2019-12-18 NOTE — Progress Notes (Signed)
Please call: Thyroid level is on the low side and we can increase her levothyroxine to 1-1/2 tablets daily to see if it helps her fatigue.  However she needs to let us know if she has more anxiety with this.  Next prescription will be for 75 mcg levothyroxine daily

## 2019-12-24 ENCOUNTER — Other Ambulatory Visit: Payer: Self-pay | Admitting: Endocrinology

## 2019-12-24 ENCOUNTER — Other Ambulatory Visit: Payer: Self-pay

## 2019-12-24 MED ORDER — LEVOTHYROXINE SODIUM 75 MCG PO TABS: 75.0000 ug | ORAL_TABLET | Freq: Every day | ORAL | 1 refills | Status: DC

## 2020-01-08 ENCOUNTER — Other Ambulatory Visit: Payer: Self-pay | Admitting: Endocrinology

## 2020-01-09 ENCOUNTER — Other Ambulatory Visit: Payer: Self-pay

## 2020-01-12 ENCOUNTER — Telehealth: Payer: Self-pay

## 2020-01-12 DIAGNOSIS — F3175 Bipolar disorder, in partial remission, most recent episode depressed: Secondary | ICD-10-CM

## 2020-01-12 DIAGNOSIS — F411 Generalized anxiety disorder: Secondary | ICD-10-CM

## 2020-01-12 MED ORDER — HYDROXYZINE PAMOATE 50 MG PO CAPS: 50.0000 mg | ORAL_CAPSULE | Freq: Two times a day (BID) | ORAL | 1 refills | Status: DC | PRN

## 2020-01-12 MED ORDER — DULOXETINE HCL 60 MG PO CPEP: 60.0000 mg | ORAL_CAPSULE | Freq: Two times a day (BID) | ORAL | 1 refills | Status: DC

## 2020-01-12 MED ORDER — QUETIAPINE FUMARATE 100 MG PO TABS: ORAL_TABLET | ORAL | 1 refills | Status: DC

## 2020-01-12 NOTE — Telephone Encounter (Signed)
I have sent Seroquel and Cymbalta to pharmacy.

## 2020-01-12 NOTE — Telephone Encounter (Signed)
pt needs refills on her medication . quetiapine  and duloxetine

## 2020-01-20 ENCOUNTER — Other Ambulatory Visit: Payer: Self-pay

## 2020-01-20 ENCOUNTER — Telehealth (INDEPENDENT_AMBULATORY_CARE_PROVIDER_SITE_OTHER): Payer: BC Managed Care – PPO | Admitting: Psychiatry

## 2020-01-20 ENCOUNTER — Encounter: Payer: Self-pay | Admitting: Psychiatry

## 2020-01-20 DIAGNOSIS — F411 Generalized anxiety disorder: Secondary | ICD-10-CM

## 2020-01-20 DIAGNOSIS — R251 Tremor, unspecified: Secondary | ICD-10-CM

## 2020-01-20 DIAGNOSIS — Z79899 Other long term (current) drug therapy: Secondary | ICD-10-CM

## 2020-01-20 DIAGNOSIS — F3175 Bipolar disorder, in partial remission, most recent episode depressed: Secondary | ICD-10-CM

## 2020-01-20 MED ORDER — CLONAZEPAM 0.5 MG PO TABS: 0.2500 mg | ORAL_TABLET | ORAL | 1 refills | Status: DC

## 2020-01-20 MED ORDER — DULOXETINE HCL 20 MG PO CPEP: 20.0000 mg | ORAL_CAPSULE | Freq: Every day | ORAL | 0 refills | Status: DC

## 2020-01-20 MED ORDER — ESCITALOPRAM OXALATE 10 MG PO TABS: 5.0000 mg | ORAL_TABLET | Freq: Every day | ORAL | 1 refills | Status: DC

## 2020-01-20 NOTE — Patient Instructions (Signed)
Clonazepam tablets What is this medicine? CLONAZEPAM (kloe NA ze pam) is a benzodiazepine. It is used to treat certain types of seizures. It is also used to treat panic disorder. This medicine may be used for other purposes; ask your health care provider or pharmacist if you have questions. COMMON BRAND NAME(S): Ceberclon, Klonopin What should I tell my health care provider before I take this medicine? They need to know if you have any of these conditions:  an alcohol or drug abuse problem  bipolar disorder, depression, psychosis or other mental health condition  glaucoma  kidney or liver disease  lung or breathing disease  myasthenia gravis  Parkinson's disease  porphyria  seizures or a history of seizures  suicidal thoughts  an unusual or allergic reaction to clonazepam, other benzodiazepines, foods, dyes, or preservatives  pregnant or trying to get pregnant  breast-feeding How should I use this medicine? Take this medicine by mouth with a glass of water. Follow the directions on the prescription label. If it upsets your stomach, take it with food or milk. Take your medicine at regular intervals. Do not take it more often than directed. Do not stop taking or change the dose except on the advice of your doctor or health care professional. A special MedGuide will be given to you by the pharmacist with each prescription and refill. Be sure to read this information carefully each time. Talk to your pediatrician regarding the use of this medicine in children. Special care may be needed. Overdosage: If you think you have taken too much of this medicine contact a poison control center or emergency room at once. NOTE: This medicine is only for you. Do not share this medicine with others. What if I miss a dose? If you miss a dose, take it as soon as you can. If it is almost time for your next dose, take only that dose. Do not take double or extra doses. What may interact with this  medicine? Do not take this medication with any of the following medicines:  narcotic medicines for cough  sodium oxybate This medicine may also interact with the following medications:  alcohol  antihistamines for allergy, cough and cold  antiviral medicines for HIV or AIDS  certain medicines for anxiety or sleep  certain medicines for depression, like amitriptyline, fluoxetine, sertraline  certain medicines for fungal infections like ketoconazole and itraconazole  certain medicines for seizures like carbamazepine, phenobarbital, phenytoin, primidone  general anesthetics like halothane, isoflurane, methoxyflurane, propofol  local anesthetics like lidocaine, pramoxine, tetracaine  medicines that relax muscles for surgery  narcotic medicines for pain  phenothiazines like chlorpromazine, mesoridazine, prochlorperazine, thioridazine This list may not describe all possible interactions. Give your health care provider a list of all the medicines, herbs, non-prescription drugs, or dietary supplements you use. Also tell them if you smoke, drink alcohol, or use illegal drugs. Some items may interact with your medicine. What should I watch for while using this medicine? Tell your doctor or health care professional if your symptoms do not start to get better or if they get worse. Do not stop taking except on your doctor's advice. You may develop a severe reaction. Your doctor will tell you how much medicine to take. You may get drowsy or dizzy. Do not drive, use machinery, or do anything that needs mental alertness until you know how this medicine affects you. To reduce the risk of dizzy and fainting spells, do not stand or sit up quickly, especially if you are  an older patient. Alcohol may increase dizziness and drowsiness. Avoid alcoholic drinks. If you are taking another medicine that also causes drowsiness, you may have more side effects. Give your health care provider a list of all  medicines you use. Your doctor will tell you how much medicine to take. Do not take more medicine than directed. Call emergency for help if you have problems breathing or unusual sleepiness. The use of this medicine may increase the chance of suicidal thoughts or actions. Pay special attention to how you are responding while on this medicine. Any worsening of mood, or thoughts of suicide or dying should be reported to your health care professional right away. What side effects may I notice from receiving this medicine? Side effects that you should report to your doctor or health care professional as soon as possible:  allergic reactions like skin rash, itching or hives, swelling of the face, lips, or tongue  breathing problems  confusion  loss of balance or coordination  signs and symptoms of low blood pressure like dizziness; feeling faint or lightheaded, falls; unusually weak or tired  suicidal thoughts or mood changes Side effects that usually do not require medical attention (report to your doctor or health care professional if they continue or are bothersome):  dizziness  headache  tiredness  upset stomach This list may not describe all possible side effects. Call your doctor for medical advice about side effects. You may report side effects to FDA at 1-800-FDA-1088. Where should I keep my medicine? Keep out of the reach of children. This medicine can be abused. Keep your medicine in a safe place to protect it from theft. Do not share this medicine with anyone. Selling or giving away this medicine is dangerous and against the law. This medicine may cause accidental overdose and death if taken by other adults, children, or pets. Mix any unused medicine with a substance like cat litter or coffee grounds. Then throw the medicine away in a sealed container like a sealed bag or a coffee can with a lid. Do not use the medicine after the expiration date. Store at room temperature between  15 and 30 degrees C (59 and 86 degrees F). Protect from light. Keep container tightly closed. NOTE: This sheet is a summary. It may not cover all possible information. If you have questions about this medicine, talk to your doctor, pharmacist, or health care provider.  2020 Elsevier/Gold Standard (2015-12-10 18:46:32) Escitalopram tablets What is this medicine? ESCITALOPRAM (es sye TAL oh pram) is used to treat depression and certain types of anxiety. This medicine may be used for other purposes; ask your health care provider or pharmacist if you have questions. COMMON BRAND NAME(S): Lexapro What should I tell my health care provider before I take this medicine? They need to know if you have any of these conditions:  bipolar disorder or a family history of bipolar disorder  diabetes  glaucoma  heart disease  kidney or liver disease  receiving electroconvulsive therapy  seizures (convulsions)  suicidal thoughts, plans, or attempt by you or a family member  an unusual or allergic reaction to escitalopram, the related drug citalopram, other medicines, foods, dyes, or preservatives  pregnant or trying to become pregnant  breast-feeding How should I use this medicine? Take this medicine by mouth with a glass of water. Follow the directions on the prescription label. You can take it with or without food. If it upsets your stomach, take it with food. Take your medicine at  regular intervals. Do not take it more often than directed. Do not stop taking this medicine suddenly except upon the advice of your doctor. Stopping this medicine too quickly may cause serious side effects or your condition may worsen. A special MedGuide will be given to you by the pharmacist with each prescription and refill. Be sure to read this information carefully each time. Talk to your pediatrician regarding the use of this medicine in children. Special care may be needed. Overdosage: If you think you have  taken too much of this medicine contact a poison control center or emergency room at once. NOTE: This medicine is only for you. Do not share this medicine with others. What if I miss a dose? If you miss a dose, take it as soon as you can. If it is almost time for your next dose, take only that dose. Do not take double or extra doses. What may interact with this medicine? Do not take this medicine with any of the following medications:  certain medicines for fungal infections like fluconazole, itraconazole, ketoconazole, posaconazole, voriconazole  cisapride  citalopram  dronedarone  linezolid  MAOIs like Carbex, Eldepryl, Marplan, Nardil, and Parnate  methylene blue (injected into a vein)  pimozide  thioridazine This medicine may also interact with the following medications:  alcohol  amphetamines  aspirin and aspirin-like medicines  carbamazepine  certain medicines for depression, anxiety, or psychotic disturbances  certain medicines for migraine headache like almotriptan, eletriptan, frovatriptan, naratriptan, rizatriptan, sumatriptan, zolmitriptan  certain medicines for sleep  certain medicines that treat or prevent blood clots like warfarin, enoxaparin, dalteparin  cimetidine  diuretics  dofetilide  fentanyl  furazolidone  isoniazid  lithium  metoprolol  NSAIDs, medicines for pain and inflammation, like ibuprofen or naproxen  other medicines that prolong the QT interval (cause an abnormal heart rhythm)  procarbazine  rasagiline  supplements like St. John's wort, kava kava, valerian  tramadol  tryptophan  ziprasidone This list may not describe all possible interactions. Give your health care provider a list of all the medicines, herbs, non-prescription drugs, or dietary supplements you use. Also tell them if you smoke, drink alcohol, or use illegal drugs. Some items may interact with your medicine. What should I watch for while using this  medicine? Tell your doctor if your symptoms do not get better or if they get worse. Visit your doctor or health care professional for regular checks on your progress. Because it may take several weeks to see the full effects of this medicine, it is important to continue your treatment as prescribed by your doctor. Patients and their families should watch out for new or worsening thoughts of suicide or depression. Also watch out for sudden changes in feelings such as feeling anxious, agitated, panicky, irritable, hostile, aggressive, impulsive, severely restless, overly excited and hyperactive, or not being able to sleep. If this happens, especially at the beginning of treatment or after a change in dose, call your health care professional. Dennis Bast may get drowsy or dizzy. Do not drive, use machinery, or do anything that needs mental alertness until you know how this medicine affects you. Do not stand or sit up quickly, especially if you are an older patient. This reduces the risk of dizzy or fainting spells. Alcohol may interfere with the effect of this medicine. Avoid alcoholic drinks. Your mouth may get dry. Chewing sugarless gum or sucking hard candy, and drinking plenty of water may help. Contact your doctor if the problem does not go away or  is severe. What side effects may I notice from receiving this medicine? Side effects that you should report to your doctor or health care professional as soon as possible:  allergic reactions like skin rash, itching or hives, swelling of the face, lips, or tongue  anxious  black, tarry stools  changes in vision  confusion  elevated mood, decreased need for sleep, racing thoughts, impulsive behavior  eye pain  fast, irregular heartbeat  feeling faint or lightheaded, falls  feeling agitated, angry, or irritable  hallucination, loss of contact with reality  loss of balance or coordination  loss of memory  painful or prolonged  erections  restlessness, pacing, inability to keep still  seizures  stiff muscles  suicidal thoughts or other mood changes  trouble sleeping  unusual bleeding or bruising  unusually weak or tired  vomiting Side effects that usually do not require medical attention (report to your doctor or health care professional if they continue or are bothersome):  changes in appetite  change in sex drive or performance  headache  increased sweating  indigestion, nausea  tremors This list may not describe all possible side effects. Call your doctor for medical advice about side effects. You may report side effects to FDA at 1-800-FDA-1088. Where should I keep my medicine? Keep out of reach of children. Store at room temperature between 15 and 30 degrees C (59 and 86 degrees F). Throw away any unused medicine after the expiration date. NOTE: This sheet is a summary. It may not cover all possible information. If you have questions about this medicine, talk to your doctor, pharmacist, or health care provider.  2020 Elsevier/Gold Standard (2018-06-24 11:21:44)

## 2020-01-20 NOTE — Progress Notes (Signed)
Provider Location : [facility] Patient Location : Home  Virtual Visit via Video Note  I connected with Tonye Royalty on 01/20/20 at 11:20 AM EDT by a video enabled telemedicine application and verified that I am speaking with the correct person using two identifiers.   I discussed the limitations of evaluation and management by telemedicine and the availability of in person appointments. The patient expressed understanding and agreed to proceed.    I discussed the assessment and treatment plan with the patient. The patient was provided an opportunity to ask questions and all were answered. The patient agreed with the plan and demonstrated an understanding of the instructions.   The patient was advised to call back or seek an in-person evaluation if the symptoms worsen or if the condition fails to improve as anticipated.   [facility] MD OP Progress Note  01/20/2020 4:00 PM KAITLYNN TRAMONTANA  MRN:  [id number]  Chief Complaint:  Chief Complaint    Follow-up     HPI: SHAMYIA GRANDPRE is a 59 year old Caucasian female who has a history of bipolar disorder, GAD, fibromyalgia, tremors, lives in [place], was evaluated by telemedicine today.  Patient today reports she is currently struggling with gait instability.  She has upcoming MRI imaging scheduled.  She reports she had to reschedule the MRI appointment due to her health insurance plan issues.  She continues to work with her providers on the same.  She reports she is currently struggling with a lot of anxiety.  She reports she is biting her nails all the time since she is so anxious.  She she is still waiting for an appointment with her therapist.  She also reports sadness, low energy, lack of focus.  Patient reports sleep as okay.  Patient denies any suicidality, homicidality or perceptual disturbances.  Patient denies any other concerns today.  Visit Diagnosis:    ICD-10-CM   1. Bipolar disorder, in partial remission, most recent episode  depressed ([place])  F31.75   2. GAD (generalized anxiety disorder)  F41.1 DULoxetine (CYMBALTA) 20 MG capsule    escitalopram (LEXAPRO) 10 MG tablet    clonazePAM (KLONOPIN) 0.5 MG tablet  3. Tremor  R25.1   4. High risk medication use  Z79.899 EKG 12-Lead    Past Psychiatric History: I have reviewed past psychiatric history from my progress note on 09/11/2018  Past Medical History:  Past Medical History:  Diagnosis Date  . Anxiety   . Asthma   . Cancer [facility])    SKIN CANCER  . CHF (congestive heart failure) ([place])   . Depression   . Hypertension   . Thyroid disease     Past Surgical History:  Procedure Laterality Date  . ANKLE SURGERY Right 2012  . APPENDECTOMY    . AUGMENTATION MAMMAPLASTY Bilateral   . BREAST ENHANCEMENT SURGERY    . COLONOSCOPY WITH PROPOFOL N/A 02/22/2015   Procedure: COLONOSCOPY WITH PROPOFOL;  Surgeon: Josefine Class, MD;  Location: [facility] ENDOSCOPY;  Service: Endoscopy;  Laterality: N/A;  . ESOPHAGOGASTRODUODENOSCOPY (EGD) WITH PROPOFOL N/A 02/22/2015   Procedure: ESOPHAGOGASTRODUODENOSCOPY (EGD) WITH PROPOFOL;  Surgeon: Josefine Class, MD;  Location[facility] ENDOSCOPY;  Service: Endoscopy;  Laterality: N/A;  . ESOPHAGOGASTRODUODENOSCOPY (EGD) WITH PROPOFOL N/A 02/11/2016   Procedure: ESOPHAGOGASTRODUODENOSCOPY (EGD) WITH PROPOFOL;  Surgeon: Manya Silvas, MD;  Location: [facility] ENDOSCOPY;  Service: Endoscopy;  Laterality: N/A;  . HEMORRHOID SURGERY    . NASAL SINUS SURGERY    . SAVORY DILATION N/A 02/22/2015   Procedure: SAVORY  DILATION;  Surgeon: Josefine Class, MD;  Location: [facility] ENDOSCOPY;  Service: Endoscopy;  Laterality: N/A;  . SAVORY DILATION N/A 02/11/2016   Procedure: SAVORY DILATION;  Surgeon: Manya Silvas, MD;  Location: [facility] ENDOSCOPY;  Service: Endoscopy;  Laterality: N/A;  . SKIN CANCER EXCISION      Family Psychiatric History: I have reviewed family psychiatric history from my progress note on 09/11/2018  Family History:  Family  History  Problem Relation Age of Onset  . Cancer Mother   . Depression Mother   . Anxiety disorder Mother   . Heart disease Father   . Cancer Father   . Anxiety disorder Father   . Depression Father   . Hypoparathyroidism Son     Social History: Reviewed social history from my progress note on 09/11/2018 Social History   Socioeconomic History  . Marital status: Married    Spouse name: Legrand Como  . Number of children: Not on file  . Years of education: Not on file  . Highest education level: Not on file  Occupational History  . Not on file  Tobacco Use  . Smoking status: Never Smoker  . Smokeless tobacco: Never Used  Vaping Use  . Vaping Use: Never assessed  Substance and Sexual Activity  . Alcohol use: No    Alcohol/week: 0.0 standard drinks    Comment: MAYBE ONCE OR TWICE A MONTH  . Drug use: No  . Sexual activity: Not Currently    Partners: Male  Other Topics Concern  . Not on file  Social History Narrative  . Not on file   Social Determinants of Health   Financial Resource Strain:   . Difficulty of Paying Living Expenses:   Food Insecurity:   . Worried About Charity fundraiser in the Last Year:   . Arboriculturist in the Last Year:   Transportation Needs:   . Film/video editor (Medical):   Marland Kitchen Lack of Transportation (Non-Medical):   Physical Activity:   . Days of Exercise per Week:   . Minutes of Exercise per Session:   Stress:   . Feeling of Stress :   Social Connections:   . Frequency of Communication with Friends and Family:   . Frequency of Social Gatherings with Friends and Family:   . Attends Religious Services:   . Active Member of Clubs or Organizations:   . Attends Archivist Meetings:   Marland Kitchen Marital Status:     Allergies:  Allergies  Allergen Reactions  . Atorvastatin   . Hydrochlorothiazide Other (See Comments)    Intolerance - dry lips  . Parafon Forte Dsc [Chlorzoxazone]   . Pollen Extract   . Sudafed [Pseudoephedrine  Hcl]     Heart racing    Metabolic Disorder Labs: No results found for: HGBA1C, MPG Lab Results  Component Value Date   PROLACTIN 18.3 07/22/2014   No results found for: CHOL, TRIG, HDL, CHOLHDL, VLDL, LDLCALC Lab Results  Component Value Date   TSH 3.95 12/17/2019   TSH 3.66 05/22/2019    Therapeutic Level Labs: No results found for: LITHIUM Lab Results  Component Value Date   VALPROATE 39 (L) 02/04/2019   No components found for:  CBMZ  Current Medications: Current Outpatient Medications  Medication Sig Dispense Refill  . chlorpheniramine-HYDROcodone (TUSSIONEX) 10-8 MG/5ML SUER Take by mouth.    Marland Kitchen azelastine (ASTELIN) 0.1 % nasal spray Place into the nose.    . calcitRIOL (ROCALTROL) 0.25 MCG capsule TAKE ONE  CAPSULE BY MOUTH DAILY 30 capsule 2  . clonazePAM (KLONOPIN) 0.5 MG tablet Take 0.5-1 tablets (0.25-0.5 mg total) by mouth as directed. Take half to 1 tablet daily up to 2-3 times a week only for severe anxiety 12 tablet 1  . DULoxetine (CYMBALTA) 20 MG capsule Take 1 capsule (20 mg total) by mouth daily. 7 capsule 0  . escitalopram (LEXAPRO) 10 MG tablet Take 0.5-1 tablets (5-10 mg total) by mouth daily. Start taking 5 mg daily ( half tablet)for 1 week and increase to 10 mg daily after that . 30 tablet 1  . fluticasone (FLONASE) 50 MCG/ACT nasal spray     . gabapentin (NEURONTIN) 300 MG capsule TAKE TWO CAPSULES BY MOUTH EVERY MORNING THEN ONE CAPSULE DAILY AT MIDDAY THEN TWO CAPSULES EVERY EVENING    . hydrOXYzine (VISTARIL) 50 MG capsule Take 1 capsule (50 mg total) by mouth 2 (two) times daily as needed for anxiety. 60 capsule 1  . levothyroxine (SYNTHROID) 75 MCG tablet Take 1 tablet (75 mcg total) by mouth daily before breakfast. 90 tablet 1  . lisinopril (ZESTRIL) 10 MG tablet Take 10 mg by mouth daily.    Marland Kitchen lovastatin (MEVACOR) 20 MG tablet Take 20 mg by mouth at bedtime.    . ondansetron (ZOFRAN-ODT) 4 MG disintegrating tablet     . pantoprazole (PROTONIX) 40  MG tablet Take 1 tablet (40 mg total) by mouth daily. 30 tablet 1  . propranolol (INNOPRAN XL) 80 MG 24 hr capsule Take by mouth.    . QUEtiapine (SEROQUEL) 100 MG tablet TAKE TWO TABLETS BY MOUTH EVERY NIGHT AT BEDTIME 60 tablet 1  . vitamin B-12 (CYANOCOBALAMIN) 1000 MCG tablet Take by mouth.     No current facility-administered medications for this visit.     Musculoskeletal: Strength & Muscle Tone: UTA Gait & Station: normal Patient leans: N/A  Psychiatric Specialty Exam: Review of Systems  Psychiatric/Behavioral: Positive for dysphoric mood. The patient is nervous/anxious.   All other systems reviewed and are negative.   There were no vitals taken for this visit.There is no height or weight on file to calculate BMI.  General Appearance: Casual  Eye Contact:  Fair  Speech:  Normal Rate  Volume:  Normal  Mood:  Anxious and Depressed  Affect:  Congruent  Thought Process:  Goal Directed and Descriptions of Associations: Intact  Orientation:  Full (Time, Place, and Person)  Thought Content: Logical   Suicidal Thoughts:  No  Homicidal Thoughts:  No  Memory:  Immediate;   Fair Recent;   Fair Remote;   Fair  Judgement:  Fair  Insight:  Fair  Psychomotor Activity:  Normal  Concentration:  Concentration: Fair and Attention Span: Fair  Recall:  [employer] of Knowledge: Fair  Language: Fair  Akathisia:  No  Handed:  Right  AIMS (if indicated):UTA  Assets:  Communication Skills Desire for Improvement Housing Social Support  ADL's:  Intact  Cognition: WNL  Sleep:  Fair   Screenings:   Assessment and Plan: LAVERE SHINSKY is a 59 year old Caucasian female, married, lives in [place], has a history of bipolar disorder, anxiety disorder, fibromyalgia, IBS was evaluated by telemedicine today.  Patient is currently struggling with gait instability, anxiety and depressive symptoms.  Patient will continue to benefit from medication readjustment and psychotherapy sessions.   Plan as noted below.  Plan Bipolar disorder in partial remission Seroquel 200 mg p.o. nightly Hydroxyzine 50 mg p.o. twice daily as needed for anxiety. Gabapentin as  prescribed for pain provider , which is also a mood stabilizer Taper of Cymbalta. Start Lexapro 5 mg p.o. daily for a week and increase to 10 mg p.o. daily after that  GAD-unstable Taper of Cymbalta. Start Lexapro 5 mg p.o. daily for a week and increase to 10 mg p.o. daily. Provided medication education. Start Klonopin 0.25 to 0.5 mg as needed-2-3 times a week only for severe anxiety attacks Discussed with patient to limit use and provided education about long-term risk of benzodiazepine therapy.  Tremors-improving Continue Cogentin as needed   Patient advised to reach out to her therapist to restart psychotherapy sessions.  She will reach out to Ms. Miguel Dibble.  Follow-up in clinic in 6 weeks or sooner if needed.  I have spent atleast 20 minutes non face to face with patient today. More than 50 % of the time was spent for preparing to see the patient ( e.g., review of test, records ), ordering medications and test ,psychoeducation and supportive psychotherapy and care coordination,as well as documenting clinical information in electronic health record. This note was generated in part or whole with voice recognition software. Voice recognition is usually quite accurate but there are transcription errors that can and very often do occur. I apologize for any typographical errors that were not detected and corrected.      Ursula Alert, MD 01/20/2020, 4:00 PM

## 2020-01-24 ENCOUNTER — Other Ambulatory Visit: Payer: Self-pay | Admitting: Psychiatry

## 2020-01-24 DIAGNOSIS — F411 Generalized anxiety disorder: Secondary | ICD-10-CM

## 2020-02-05 ENCOUNTER — Other Ambulatory Visit: Payer: Self-pay | Admitting: Endocrinology

## 2020-02-11 ENCOUNTER — Other Ambulatory Visit: Payer: Self-pay

## 2020-02-11 ENCOUNTER — Ambulatory Visit
Admission: RE | Admit: 2020-02-11 | Discharge: 2020-02-11 | Disposition: A | Discharge status: Home / Self Care | Payer: Medicare PPO | Source: Ambulatory Visit | Attending: Neurology | Admitting: Neurology

## 2020-02-11 DIAGNOSIS — R4189 Other symptoms and signs involving cognitive functions and awareness: Secondary | ICD-10-CM

## 2020-02-11 DIAGNOSIS — R2689 Other abnormalities of gait and mobility: Secondary | ICD-10-CM

## 2020-02-11 MED ORDER — GADOBENATE DIMEGLUMINE 529 MG/ML IV SOLN
16.0000 mL | Freq: Once | INTRAVENOUS | Status: AC | PRN
  Administered 2020-02-11: 16 mL via INTRAVENOUS

## 2020-02-24 ENCOUNTER — Ambulatory Visit
Admission: RE | Admit: 2020-02-24 | Discharge: 2020-02-24 | Disposition: A | Discharge status: Home / Self Care | Payer: Medicare PPO | Attending: Internal Medicine | Admitting: Internal Medicine

## 2020-02-24 ENCOUNTER — Inpatient Hospital Stay: Payer: Medicare PPO | Attending: Internal Medicine | Admitting: Internal Medicine

## 2020-02-24 ENCOUNTER — Encounter: Payer: Self-pay | Admitting: Internal Medicine

## 2020-02-24 ENCOUNTER — Other Ambulatory Visit: Payer: Self-pay

## 2020-02-24 ENCOUNTER — Telehealth: Payer: Self-pay | Admitting: *Deleted

## 2020-02-24 ENCOUNTER — Other Ambulatory Visit: Payer: Self-pay | Admitting: *Deleted

## 2020-02-24 ENCOUNTER — Inpatient Hospital Stay: Payer: Medicare PPO

## 2020-02-24 ENCOUNTER — Ambulatory Visit
Admission: RE | Admit: 2020-02-24 | Discharge: 2020-02-24 | Disposition: A | Discharge status: Home / Self Care | Payer: Medicare PPO | Source: Ambulatory Visit | Attending: Internal Medicine | Admitting: Internal Medicine

## 2020-02-24 ENCOUNTER — Encounter (INDEPENDENT_AMBULATORY_CARE_PROVIDER_SITE_OTHER): Payer: Self-pay

## 2020-02-24 VITALS — BP 118/75 | HR 99 | Temp 99.0°F | Resp 18 | Ht 64.0 in | Wt 176.0 lb

## 2020-02-24 DIAGNOSIS — I110 Hypertensive heart disease with heart failure: Secondary | ICD-10-CM | POA: Insufficient documentation

## 2020-02-24 DIAGNOSIS — D509 Iron deficiency anemia, unspecified: Secondary | ICD-10-CM

## 2020-02-24 DIAGNOSIS — Z79899 Other long term (current) drug therapy: Secondary | ICD-10-CM | POA: Diagnosis not present

## 2020-02-24 DIAGNOSIS — Z1231 Encounter for screening mammogram for malignant neoplasm of breast: Secondary | ICD-10-CM | POA: Diagnosis present

## 2020-02-24 DIAGNOSIS — E538 Deficiency of other specified B group vitamins: Secondary | ICD-10-CM | POA: Diagnosis not present

## 2020-02-24 DIAGNOSIS — Z85820 Personal history of malignant melanoma of skin: Secondary | ICD-10-CM | POA: Insufficient documentation

## 2020-02-24 DIAGNOSIS — R2689 Other abnormalities of gait and mobility: Secondary | ICD-10-CM | POA: Diagnosis not present

## 2020-02-24 DIAGNOSIS — M899 Disorder of bone, unspecified: Secondary | ICD-10-CM

## 2020-02-24 DIAGNOSIS — F329 Major depressive disorder, single episode, unspecified: Secondary | ICD-10-CM | POA: Diagnosis not present

## 2020-02-24 DIAGNOSIS — M797 Fibromyalgia: Secondary | ICD-10-CM | POA: Insufficient documentation

## 2020-02-24 DIAGNOSIS — R42 Dizziness and giddiness: Secondary | ICD-10-CM | POA: Insufficient documentation

## 2020-02-24 DIAGNOSIS — J45909 Unspecified asthma, uncomplicated: Secondary | ICD-10-CM | POA: Insufficient documentation

## 2020-02-24 DIAGNOSIS — I509 Heart failure, unspecified: Secondary | ICD-10-CM | POA: Diagnosis not present

## 2020-02-24 DIAGNOSIS — R918 Other nonspecific abnormal finding of lung field: Secondary | ICD-10-CM | POA: Insufficient documentation

## 2020-02-24 DIAGNOSIS — E876 Hypokalemia: Secondary | ICD-10-CM | POA: Insufficient documentation

## 2020-02-24 DIAGNOSIS — F419 Anxiety disorder, unspecified: Secondary | ICD-10-CM | POA: Insufficient documentation

## 2020-02-24 LAB — COMPREHENSIVE METABOLIC PANEL
ALT: 91 U/L — ABNORMAL HIGH (ref 0–44)
AST: 99 U/L — ABNORMAL HIGH (ref 15–41)
Albumin: 4 g/dL (ref 3.5–5.0)
Alkaline Phosphatase: 120 U/L (ref 38–126)
Anion gap: 14 (ref 5–15)
BUN: 21 mg/dL — ABNORMAL HIGH (ref 6–20)
CO2: 32 mmol/L (ref 22–32)
Calcium: 9 mg/dL (ref 8.9–10.3)
Chloride: 94 mmol/L — ABNORMAL LOW (ref 98–111)
Creatinine, Ser: 1.08 mg/dL — ABNORMAL HIGH (ref 0.44–1.00)
GFR calc Af Amer: >60 mL/min (ref >60)
GFR calc non Af Amer: 56 mL/min — ABNORMAL LOW (ref >60)
Glucose, Bld: 152 mg/dL — ABNORMAL HIGH (ref 70–99)
Potassium: 2.7 mmol/L — CL (ref 3.5–5.1)
Sodium: 140 mmol/L (ref 135–145)
Total Bilirubin: 0.8 mg/dL (ref 0.3–1.2)
Total Protein: 7.4 g/dL (ref 6.5–8.1)

## 2020-02-24 LAB — IRON AND TIBC
Iron: 76 ug/dL (ref 28–170)
Saturation Ratios: 17 % (ref 10.4–31.8)
TIBC: 444 ug/dL (ref 250–450)
UIBC: 368 ug/dL

## 2020-02-24 LAB — CBC WITH DIFFERENTIAL/PLATELET
Abs Immature Granulocytes: 0.02 10*3/uL (ref 0.00–0.07)
Basophils Absolute: 0 10*3/uL (ref 0.0–0.1)
Basophils Relative: 1 %
Eosinophils Absolute: 0.3 10*3/uL (ref 0.0–0.5)
Eosinophils Relative: 4 %
HCT: 33.2 % — ABNORMAL LOW (ref 36.0–46.0)
Hemoglobin: 11.5 g/dL — ABNORMAL LOW (ref 12.0–15.0)
Immature Granulocytes: 0 %
Lymphocytes Relative: 20 %
Lymphs Abs: 1.3 10*3/uL (ref 0.7–4.0)
MCH: 30.3 pg (ref 26.0–34.0)
MCHC: 34.6 g/dL (ref 30.0–36.0)
MCV: 87.4 fL (ref 80.0–100.0)
Monocytes Absolute: 0.6 10*3/uL (ref 0.1–1.0)
Monocytes Relative: 9 %
Neutro Abs: 4.3 10*3/uL (ref 1.7–7.7)
Neutrophils Relative %: 66 %
Platelets: 277 10*3/uL (ref 150–400)
RBC: 3.8 MIL/uL — ABNORMAL LOW (ref 3.87–5.11)
RDW: 13.2 % (ref 11.5–15.5)
WBC: 6.5 10*3/uL (ref 4.0–10.5)
nRBC: 0 % (ref 0.0–0.2)

## 2020-02-24 LAB — FERRITIN: Ferritin: 17 ng/mL (ref 11–307)

## 2020-02-24 LAB — LACTATE DEHYDROGENASE: LDH: 239 U/L — ABNORMAL HIGH (ref 98–192)

## 2020-02-24 MED ORDER — POTASSIUM CHLORIDE CRYS ER 20 MEQ PO TBCR
20.0000 meq | EXTENDED_RELEASE_TABLET | Freq: Two times a day (BID) | ORAL | 0 refills | Status: DC
Start: 2020-02-24 — End: 2020-02-27

## 2020-02-24 NOTE — Progress Notes (Signed)
[facility] NOTE  Patient Care Team: Dion Body, MD as PCP - General (Family Medicine)  CHIEF COMPLAINTS/PURPOSE OF CONSULTATION: SKULL lesions  # MULTIPLE SCLEROTIC LESIONS IN SKULL- MAY 2021- M- spike [NEG; [facility]]  #Right lower lobe lung nodule-2019; PET scan-s/p CT-guided lung biopsy-inflammation [Dr.G]; March 2020 CT scan stable; May 2021-myeloma panel negative [Dr.Shah]; CBC CMP-unremarkable  # B12 deficiency-B12 supplementation  # Fibromyalgia/ gait instability/ Dizziness [BPPV]  # Melanoma- left arm [s/p excision ~ 40 years]; mammogram-; colonoscopy/EGD- oct 2019 [? Anal uptake on PET-2019]  Oncology History   No history exists.     HISTORY OF PRESENTING ILLNESS:  Kaylee Gordon 59 y.o.  female has been referred to [place] for work-up of multiple myeloma given the abnormal findings incidental findings noted on MRI brain.  Patient has been followed by neurology-for multiple neurological complaints-dizziness joint aches body pain; gait instability.  Also noted to have worsening memory issues.    Patient had prior work-up for right lower lobe lung nodule-in 2019 including a PET scan/CT-guided biopsy negative for malignancy.  Patient obviously is very nervous-given the concerns for malignancy.  Review of Systems  Constitutional: Positive for malaise/fatigue. Negative for chills, diaphoresis, fever and weight loss.  HENT: Negative for nosebleeds and sore throat.   Eyes: Negative for double vision.  Respiratory: Negative for cough, hemoptysis, sputum production, shortness of breath and wheezing.   Cardiovascular: Negative for chest pain, palpitations, orthopnea and leg swelling.  Gastrointestinal: Positive for nausea. Negative for abdominal pain, blood in stool, constipation, diarrhea, heartburn, melena and vomiting.  Genitourinary: Negative for dysuria, frequency and urgency.  Musculoskeletal: Positive for back pain, joint pain and myalgias.  Skin:  Negative.  Negative for itching and rash.  Neurological: Positive for dizziness. Negative for tingling, focal weakness, weakness and headaches.  Endo/Heme/Allergies: Does not bruise/bleed easily.  Psychiatric/Behavioral: Negative for depression. The patient is nervous/anxious.      MEDICAL HISTORY:  Past Medical History:  Diagnosis Date  . Anxiety   . Asthma   . B12 deficiency   . Cancer [facility])    SKIN CANCER  . CHF (congestive heart failure) ([place])   . Depression   . Hypertension   . Thyroid disease     SURGICAL HISTORY: Past Surgical History:  Procedure Laterality Date  . ANKLE SURGERY Right 2012  . APPENDECTOMY    . AUGMENTATION MAMMAPLASTY Bilateral   . BREAST ENHANCEMENT SURGERY    . COLONOSCOPY WITH PROPOFOL N/A 02/22/2015   Procedure: COLONOSCOPY WITH PROPOFOL;  Surgeon: Josefine Class, MD;  Location[facility] ENDOSCOPY;  Service: Endoscopy;  Laterality: N/A;  . ESOPHAGOGASTRODUODENOSCOPY (EGD) WITH PROPOFOL N/A 02/22/2015   Procedure: ESOPHAGOGASTRODUODENOSCOPY (EGD) WITH PROPOFOL;  Surgeon: Josefine Class, MD;  Location: [facility] ENDOSCOPY;  Service: Endoscopy;  Laterality: N/A;  . ESOPHAGOGASTRODUODENOSCOPY (EGD) WITH PROPOFOL N/A 02/11/2016   Procedure: ESOPHAGOGASTRODUODENOSCOPY (EGD) WITH PROPOFOL;  Surgeon: Manya Silvas, MD;  Location: [facility] ENDOSCOPY;  Service: Endoscopy;  Laterality: N/A;  . HEMORRHOID SURGERY    . NASAL SINUS SURGERY    . SAVORY DILATION N/A 02/22/2015   Procedure: SAVORY DILATION;  Surgeon: Josefine Class, MD;  Location: [facility] ENDOSCOPY;  Service: Endoscopy;  Laterality: N/A;  . SAVORY DILATION N/A 02/11/2016   Procedure: SAVORY DILATION;  Surgeon: Manya Silvas, MD;  Location: [facility] ENDOSCOPY;  Service: Endoscopy;  Laterality: N/A;  . SKIN CANCER EXCISION      SOCIAL HISTORY: Social History   Socioeconomic History  . Marital status: Married  Spouse name: Legrand Como  . Number of children: Not on file  . Years of education: Not on  file  . Highest education level: Not on file  Occupational History  . Not on file  Tobacco Use  . Smoking status: Never Smoker  . Smokeless tobacco: Never Used  Vaping Use  . Vaping Use: Never assessed  Substance and Sexual Activity  . Alcohol use: No    Alcohol/week: 0.0 standard drinks    Comment: MAYBE ONCE OR TWICE A MONTH  . Drug use: No  . Sexual activity: Not Currently    Partners: Male  Other Topics Concern  . Not on file  Social History Narrative   Lives in [place]; taught high school- math; retd; no smoking; social alcohol.    Social Determinants of Health   Financial Resource Strain:   . Difficulty of Paying Living Expenses:   Food Insecurity:   . Worried About Charity fundraiser in the Last Year:   . Arboriculturist in the Last Year:   Transportation Needs:   . Film/video editor (Medical):   Marland Kitchen Lack of Transportation (Non-Medical):   Physical Activity:   . Days of Exercise per Week:   . Minutes of Exercise per Session:   Stress:   . Feeling of Stress :   Social Connections:   . Frequency of Communication with Friends and Family:   . Frequency of Social Gatherings with Friends and Family:   . Attends Religious Services:   . Active Member of Clubs or Organizations:   . Attends Archivist Meetings:   Marland Kitchen Marital Status:   Intimate Partner Violence:   . Fear of Current or Ex-Partner:   . Emotionally Abused:   Marland Kitchen Physically Abused:   . Sexually Abused:     FAMILY HISTORY: Family History  Problem Relation Age of Onset  . Cancer Mother        colon cancer  . Depression Mother   . Anxiety disorder Mother   . Heart disease Father   . Cancer Father        ? neck cancer- still living.   . Anxiety disorder Father   . Depression Father   . Hypoparathyroidism Son   . Colon cancer Maternal Aunt        died in 64s.     ALLERGIES:  is allergic to atorvastatin, hydrochlorothiazide, parafon forte dsc [chlorzoxazone], pollen extract, and  sudafed [pseudoephedrine hcl].  MEDICATIONS:  Current Outpatient Medications  Medication Sig Dispense Refill  . calcitRIOL (ROCALTROL) 0.25 MCG capsule TAKE ONE CAPSULE BY MOUTH DAILY 30 capsule 1  . clonazePAM (KLONOPIN) 0.5 MG tablet Take 0.5-1 tablets (0.25-0.5 mg total) by mouth as directed. Take half to 1 tablet daily up to 2-3 times a week only for severe anxiety 12 tablet 1  . escitalopram (LEXAPRO) 10 MG tablet Take 0.5-1 tablets (5-10 mg total) by mouth daily. Start taking 5 mg daily ( half tablet)for 1 week and increase to 10 mg daily after that . 30 tablet 1  . fluticasone (FLONASE) 50 MCG/ACT nasal spray     . gabapentin (NEURONTIN) 300 MG capsule TAKE TWO CAPSULES BY MOUTH EVERY MORNING THEN ONE CAPSULE DAILY AT MIDDAY THEN TWO CAPSULES EVERY EVENING    . hydrOXYzine (VISTARIL) 50 MG capsule Take 1 capsule (50 mg total) by mouth 2 (two) times daily as needed for anxiety. 60 capsule 1  . levothyroxine (SYNTHROID) 75 MCG tablet Take 1 tablet (75 mcg total)  by mouth daily before breakfast. 90 tablet 1  . lisinopril (ZESTRIL) 10 MG tablet Take 10 mg by mouth daily.    Marland Kitchen lovastatin (MEVACOR) 20 MG tablet Take 20 mg by mouth at bedtime.    . ondansetron (ZOFRAN-ODT) 4 MG disintegrating tablet     . pantoprazole (PROTONIX) 40 MG tablet Take 1 tablet (40 mg total) by mouth daily. 30 tablet 1  . propranolol (INNOPRAN XL) 80 MG 24 hr capsule Take by mouth.    . QUEtiapine (SEROQUEL) 100 MG tablet TAKE TWO TABLETS BY MOUTH EVERY NIGHT AT BEDTIME 60 tablet 1  . azelastine (ASTELIN) 0.1 % nasal spray Place into the nose.    . DULoxetine (CYMBALTA) 20 MG capsule Take 1 capsule (20 mg total) by mouth daily. (Patient not taking: Reported on 02/24/2020) 7 capsule 0  . vitamin B-12 (CYANOCOBALAMIN) 1000 MCG tablet Take by mouth. (Patient not taking: Reported on 02/24/2020)     No current facility-administered medications for this visit.      Marland Kitchen  PHYSICAL EXAMINATION: ECOG PERFORMANCE STATUS: 0 -  Asymptomatic  Vitals:   02/24/20 1117  BP: 118/75  Pulse: 99  Resp: 18  Temp: 99 F (37.2 C)  SpO2: 97%   Filed Weights   02/24/20 1117  Weight: 176 lb (79.8 kg)    Physical Exam HENT:     Head: Normocephalic and atraumatic.     Mouth/Throat:     Pharynx: No oropharyngeal exudate.  Eyes:     Pupils: Pupils are equal, round, and reactive to light.  Cardiovascular:     Rate and Rhythm: Normal rate and regular rhythm.  Pulmonary:     Effort: Pulmonary effort is normal. No respiratory distress.     Breath sounds: Normal breath sounds. No wheezing.  Abdominal:     General: Bowel sounds are normal. There is no distension.     Palpations: Abdomen is soft. There is no mass.     Tenderness: There is no abdominal tenderness. There is no guarding or rebound.  Musculoskeletal:        General: No tenderness. Normal range of motion.     Cervical back: Normal range of motion and neck supple.  Skin:    General: Skin is warm.  Neurological:     Mental Status: She is alert and oriented to person, place, and time.  Psychiatric:        Mood and Affect: Affect normal.      LABORATORY DATA:  I have reviewed the data as listed Lab Results  Component Value Date   WBC 6.5 02/24/2020   HGB 11.5 (L) 02/24/2020   HCT 33.2 (L) 02/24/2020   MCV 87.4 02/24/2020   PLT 277 02/24/2020   Recent Labs    03/05/19 1551 05/22/19 1534 12/17/19 1542  NA 141 143  --   K 3.6 3.9  --   CL 101 106  --   CO2 28 27  --   GLUCOSE 140* 83  --   BUN 24* 17  --   CREATININE 1.21* 1.20  --   CALCIUM 9.4 9.3 9.4  PROT  --  7.6  --   ALBUMIN  --  4.6  --   AST  --  16  --   ALT  --  18  --   ALKPHOS  --  97  --   BILITOT  --  0.4  --     RADIOGRAPHIC STUDIES: I have personally reviewed the radiological images as  listed and agreed with the findings in the report. MR BRAIN W WO CONTRAST  Result Date: 02/11/2020 CLINICAL DATA:  Dizziness vertigo, falling.  History of melanoma. EXAM: MRI HEAD  WITHOUT AND WITH CONTRAST TECHNIQUE: Multiplanar, multiecho pulse sequences of the brain and surrounding structures were obtained without and with intravenous contrast. CONTRAST:  41m MULTIHANCE GADOBENATE DIMEGLUMINE 529 MG/ML IV SOLN COMPARISON:  None. FINDINGS: Brain: Mild atrophy. Mild changes in the white matter and pons consistent with chronic microvascular ischemia. No acute infarct. No intracranial mass or hemorrhage. Normal enhancement of the brain. Vascular: Normal arterial flow voids Skull and upper cervical spine: Multiple skull lesions are present bilaterally. These are fairly well-circumscribed and low signal on T1 and T2 and do not enhance. Sinuses/Orbits: Paranasal sinuses clear.  Negative orbit Other: None IMPRESSION: Mild atrophy and mild chronic microvascular ischemic change. No acute intracranial abnormality. Multiple skull lesions bilaterally. These may be sclerotic. Differential diagnosis includes multiple myeloma and metastatic disease. Further evaluation for myeloma recommended. CT head may also be helpful to further evaluate these lesions. Electronically Signed   By: CFranchot GalloM.D.   On: 02/11/2020 21:21    ASSESSMENT & PLAN:   Skull lesion #Multiple bilateral skull lesions noted-incidental on brain MRI-discussed the differential diagnosis-primary bone cancer like multiple myeloma versus metastatic disease.  However, clinically not very suggestive given the absence of any new onset of symptoms/localizing signs.  However remote history of melanoma noted.  #For now recommend CBC CMP LDH myeloma panel kappa lambda light chain; skeletal survey.  We will also reviewed the tumor conference.  Further work-up to be based upon current work-up.  #Lung nodules-right lower lobe s/p biopsy-October 2019 - for malignancy/positive inflammation.  #Cancer screening : SEP 2019-EGD/colo- [[place]]-polyps; order screening mammogram.  Thank you Dr.Shah for allowing me to participate in the care  of your pleasant patient. Please do not hesitate to contact me with questions or concerns in the interim.  # DISPOSITION: call pt with results.  # Mammogram screening- BIL>  # labs- today # Skeletal survey # Follow up TBD- Dr.B  All questions were answered. The patient knows to call the clinic with any problems, questions or concerns.    GCammie Sickle MD 02/24/2020 12:32 PM

## 2020-02-24 NOTE — Telephone Encounter (Signed)
Critical potassium level of 2.7 called to Dr. Rogue Bussing by Marc Morgans per md- H/T- please inform pt that her potassium is low; please send a script for Kdur meq 20 BID x 7 days; i will call her later in [place]. Thanks

## 2020-02-24 NOTE — Telephone Encounter (Signed)
Spoke with patient. Patient made aware of low potassium. Prescription sent to pharmacy. Pt gave verbal understanding of the plan of care.

## 2020-02-24 NOTE — Assessment & Plan Note (Addendum)
#  Multiple bilateral skull lesions noted-incidental on brain MRI-discussed the differential diagnosis-primary bone cancer like multiple myeloma versus metastatic disease.  However, clinically not very suggestive given the absence of any new onset of symptoms/localizing signs.  However remote history of melanoma noted.  #For now recommend CBC CMP LDH myeloma panel kappa lambda light chain; skeletal survey.  We will also reviewed the tumor conference.  Further work-up to be based upon current work-up.  #Lung nodules-right lower lobe s/p biopsy-October 2019 - for malignancy/positive inflammation.  #Cancer screening : SEP 2019-EGD/colo- [[place]]-polyps; order screening mammogram.  Thank you Dr.Shah for allowing me to participate in the care of your pleasant patient. Please do not hesitate to contact me with questions or concerns in the interim.  # DISPOSITION: call pt with results.  # Mammogram screening- BIL>  # labs- today # Skeletal survey # Follow up TBD- Dr.B

## 2020-02-24 NOTE — Telephone Encounter (Signed)
Left vm for patient to return my phone call.  Potassium Prescription sent to patient's pharmacy per md orders.

## 2020-02-25 ENCOUNTER — Telehealth: Payer: Self-pay | Admitting: Internal Medicine

## 2020-02-25 LAB — KAPPA/LAMBDA LIGHT CHAINS
Kappa free light chain: 26.3 mg/L — ABNORMAL HIGH (ref 3.3–19.4)
Kappa, lambda light chain ratio: 1.44 (ref 0.26–1.65)
Lambda free light chains: 18.3 mg/L (ref 5.7–26.3)

## 2020-02-25 NOTE — Telephone Encounter (Signed)
On 8/10-spoke to patient regarding results of the blood work; severe hypokalemia.  Recommend potassium supplementation.  Await x-rays for further discussion regarding the next plan of care. GB

## 2020-02-25 NOTE — Telephone Encounter (Signed)
Spoke to patient the results of the skeletal survey-inconclusive.   Awaiting imaging to be discussed with the tumor conference; we will keep the patient posted.

## 2020-02-26 ENCOUNTER — Other Ambulatory Visit: Payer: Medicare PPO

## 2020-02-27 ENCOUNTER — Telehealth: Payer: Self-pay | Admitting: Internal Medicine

## 2020-02-27 ENCOUNTER — Other Ambulatory Visit: Payer: Self-pay | Admitting: Internal Medicine

## 2020-02-27 DIAGNOSIS — C4362 Malignant melanoma of left upper limb, including shoulder: Secondary | ICD-10-CM

## 2020-02-27 DIAGNOSIS — M899 Disorder of bone, unspecified: Secondary | ICD-10-CM

## 2020-02-27 LAB — MULTIPLE MYELOMA PANEL, SERUM
Albumin SerPl Elph-Mcnc: 3.5 g/dL (ref 2.9–4.4)
Albumin/Glob SerPl: 1.2 (ref 0.7–1.7)
Alpha 1: 0.2 g/dL (ref 0.0–0.4)
Alpha2 Glob SerPl Elph-Mcnc: 0.8 g/dL (ref 0.4–1.0)
B-Globulin SerPl Elph-Mcnc: 1.1 g/dL (ref 0.7–1.3)
Gamma Glob SerPl Elph-Mcnc: 0.9 g/dL (ref 0.4–1.8)
Globulin, Total: 3 g/dL (ref 2.2–3.9)
IgA: 109 mg/dL (ref 87–352)
IgG (Immunoglobin G), Serum: 871 mg/dL (ref 586–1602)
IgM (Immunoglobulin M), Srm: 101 mg/dL (ref 26–217)
Total Protein ELP: 6.5 g/dL (ref 6.0–8.5)

## 2020-02-27 NOTE — Telephone Encounter (Signed)
Spoke to patient regarding results of the skeletal survey; discussion of the tumor conference- "bone findings are with impressive"-recommend PET scan-whole body.   PET ordered ASAP.   C-1 to 2 days post PET scan-MD; no labs-Dr.B

## 2020-02-27 NOTE — Progress Notes (Signed)
Tumor Board Documentation  Kaylee Gordon was presented by Dr Rogue Bussing at our Tumor Board on 02/26/2020, which included representatives from medical oncology, radiation oncology, internal medicine, navigation, pathology, radiology, surgical, research, palliative care, pulmonology.  Kaylee Gordon currently presents as a new patient, for discussion with history of the following treatments: active survellience.  Additionally, we reviewed previous medical and familial history, history of present illness, and recent lab results along with all available histopathologic and imaging studies. The tumor board considered available treatment options and made the following recommendations: Additional screening, Active surveillance (Head CT with attention to Calvarium, Whole Body PET Scan)    The following procedures/referrals were also placed: No orders of the defined types were placed in this encounter.   Clinical Trial Status: not discussed   Staging used: Not Applicable  National site-specific guidelines   were discussed with respect to the case.  Tumor board is a meeting of clinicians from various specialty areas who evaluate and discuss patients for whom a multidisciplinary approach is being considered. Final determinations in the plan of care are those of the provider(s). The responsibility for follow up of recommendations given during tumor board is that of the provider.   Today's extended care, comprehensive team conference, Kaylee Gordon was not present for the discussion and was not examined.   Multidisciplinary Tumor Board is a multidisciplinary case peer review process.  Decisions discussed in the Multidisciplinary Tumor Board reflect the opinions of the specialists present at the conference without having examined the patient.  Ultimately, treatment and diagnostic decisions rest with the primary provider(s) and the patient.

## 2020-03-10 ENCOUNTER — Other Ambulatory Visit: Payer: Self-pay

## 2020-03-10 ENCOUNTER — Ambulatory Visit
Admission: RE | Admit: 2020-03-10 | Discharge: 2020-03-10 | Disposition: A | Discharge status: Home / Self Care | Payer: Medicare PPO | Source: Ambulatory Visit | Attending: Internal Medicine | Admitting: Internal Medicine

## 2020-03-10 DIAGNOSIS — K449 Diaphragmatic hernia without obstruction or gangrene: Secondary | ICD-10-CM | POA: Insufficient documentation

## 2020-03-10 DIAGNOSIS — C4362 Malignant melanoma of left upper limb, including shoulder: Secondary | ICD-10-CM | POA: Insufficient documentation

## 2020-03-10 DIAGNOSIS — M899 Disorder of bone, unspecified: Secondary | ICD-10-CM | POA: Insufficient documentation

## 2020-03-10 LAB — GLUCOSE, CAPILLARY: Glucose-Capillary: 123 mg/dL — ABNORMAL HIGH (ref 70–99)

## 2020-03-10 MED ORDER — FLUDEOXYGLUCOSE F - 18 (FDG) INJECTION
9.9900 | Freq: Once | INTRAVENOUS | Status: AC | PRN
  Administered 2020-03-10: 9.99 via INTRAVENOUS

## 2020-03-12 ENCOUNTER — Other Ambulatory Visit: Payer: Self-pay

## 2020-03-12 ENCOUNTER — Inpatient Hospital Stay: Payer: Medicare PPO

## 2020-03-12 ENCOUNTER — Encounter: Payer: Self-pay | Admitting: Internal Medicine

## 2020-03-12 ENCOUNTER — Inpatient Hospital Stay (HOSPITAL_BASED_OUTPATIENT_CLINIC_OR_DEPARTMENT_OTHER): Payer: Medicare PPO | Admitting: Internal Medicine

## 2020-03-12 VITALS — BP 99/55 | HR 92 | Temp 99.6°F | Resp 16 | Ht 60.0 in | Wt 177.0 lb

## 2020-03-12 DIAGNOSIS — M899 Disorder of bone, unspecified: Secondary | ICD-10-CM | POA: Diagnosis not present

## 2020-03-12 LAB — COMPREHENSIVE METABOLIC PANEL
ALT: 30 U/L (ref 0–44)
AST: 34 U/L (ref 15–41)
Albumin: 4 g/dL (ref 3.5–5.0)
Alkaline Phosphatase: 87 U/L (ref 38–126)
Anion gap: 9 (ref 5–15)
BUN: 16 mg/dL (ref 6–20)
CO2: 27 mmol/L (ref 22–32)
Calcium: 8.7 mg/dL — ABNORMAL LOW (ref 8.9–10.3)
Chloride: 106 mmol/L (ref 98–111)
Creatinine, Ser: 1.01 mg/dL — ABNORMAL HIGH (ref 0.44–1.00)
GFR calc Af Amer: >60 mL/min (ref >60)
GFR calc non Af Amer: >60 mL/min (ref >60)
Glucose, Bld: 107 mg/dL — ABNORMAL HIGH (ref 70–99)
Potassium: 3.2 mmol/L — ABNORMAL LOW (ref 3.5–5.1)
Sodium: 142 mmol/L (ref 135–145)
Total Bilirubin: 0.4 mg/dL (ref 0.3–1.2)
Total Protein: 7 g/dL (ref 6.5–8.1)

## 2020-03-12 NOTE — Assessment & Plan Note (Addendum)
#  Multiple bilateral skull lesions noted; whole-body PET scan AUG 2021- [hx of melanoma]-absence of any uptake-suggestive of benign/underlying process-question polyostotic fibrous dysplasia.  Reviewed my findings with the patient-very low concern for any malignant process.  As per recommendation radiology would recommend a CT scan noncontrast in 6 months to confirm the stability of the lesions.  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  #Hypokalemia severe-potassium 2.7; unclear etiology-patient potassium; recommend compliance.  We will repeat labs today.  #Dizzy/unsteady gait-defer to neurology for further evaluation recommendations.  #Cancer screening : SEP 2019-EGD/colo- [[place]]-polyps; awaiting September 9 screening mammogram.  # DISPOSITION: # labs-cmp today # follow up in 6 months-MD; labs- cbc/cmp; CT brain prior-- Dr.B  # I reviewed the blood work- with the patient in detail; also reviewed the imaging independently [as summarized above]; and with the patient in detail.    Cc; Dr.Linthavong/Dr.Shah

## 2020-03-12 NOTE — Progress Notes (Signed)
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  Repeat imaging recommended in 6 months  #Right lower lobe lung nodule-2019; PET scan-s/p CT-guided lung biopsy-inflammation [Dr.G]; March 2020 CT scan stable; May 2021-myeloma panel negative [Dr.Shah]; CBC CMP-unremarkable  # B12 deficiency-B12 supplementation  # Fibromyalgia/ gait instability/ Dizziness [BPPV]  # Melanoma- left arm [s/p excision ~ 40 years]; mammogram-; colonoscopy/EGD- oct 2019 [? Anal uptake on PET-2019]  Oncology History   No history exists.     HISTORY OF PRESENTING ILLNESS:  Kaylee Gordon 59 y.o.  female is here for follow-up to review the results of the PET scan.  PET scan was ordered for further evaluation of the sclerotic calvarial lesions.  In the interim patient was evaluated PCP for sinus-like infection.  Started on antibiotics.  Patient continues to have multiple complaints dizziness joint aches.  Gait instability.  Review of Systems  Constitutional: Positive for malaise/fatigue. Negative for chills, diaphoresis, fever and weight loss.  HENT: Negative for nosebleeds and sore throat.   Eyes: Negative for double vision.  Respiratory: Negative for cough, hemoptysis, sputum production, shortness of breath and wheezing.   Cardiovascular: Negative for chest pain, palpitations, orthopnea and leg swelling.  Gastrointestinal: Positive for nausea. Negative for abdominal pain, blood in stool, constipation, diarrhea, heartburn, melena and vomiting.  Genitourinary: Negative for dysuria, frequency and urgency.  Musculoskeletal: Positive for back pain, joint pain and myalgias.  Skin: Negative.  Negative for  itching and rash.  Neurological: Positive for dizziness. Negative for tingling, focal weakness, weakness and headaches.  Endo/Heme/Allergies: Does not bruise/bleed easily.  Psychiatric/Behavioral: Negative for depression. The patient is nervous/anxious.      MEDICAL HISTORY:  Past Medical History:  Diagnosis Date  . Anxiety   . Asthma   . B12 deficiency   . Cancer [facility])    SKIN CANCER  . CHF (congestive heart failure) ([place])   . Depression   . Hypertension   . Thyroid disease     SURGICAL HISTORY: Past Surgical History:  Procedure Laterality Date  . ANKLE SURGERY Right 2012  . APPENDECTOMY    . AUGMENTATION MAMMAPLASTY Bilateral   . BREAST ENHANCEMENT SURGERY    . COLONOSCOPY WITH PROPOFOL N/A 02/22/2015   Procedure: COLONOSCOPY WITH PROPOFOL;  Surgeon: Josefine Class, MD;  Location: [facility] ENDOSCOPY;  Service: Endoscopy;  Laterality: N/A;  . ESOPHAGOGASTRODUODENOSCOPY (EGD) WITH PROPOFOL N/A 02/22/2015   Procedure: ESOPHAGOGASTRODUODENOSCOPY (EGD) WITH PROPOFOL;  Surgeon: Josefine Class, MD;  Location: [facility] ENDOSCOPY;  Service: Endoscopy;  Laterality: N/A;  . ESOPHAGOGASTRODUODENOSCOPY (EGD) WITH PROPOFOL N/A 02/11/2016   Procedure: ESOPHAGOGASTRODUODENOSCOPY (EGD) WITH PROPOFOL;  Surgeon: Manya Silvas, MD;  Location: [facility] ENDOSCOPY;  Service: Endoscopy;  Laterality: N/A;  . HEMORRHOID SURGERY    . NASAL SINUS SURGERY    . SAVORY DILATION N/A 02/22/2015   Procedure: SAVORY DILATION;  Surgeon: Josefine Class, MD;  Location: [facility] ENDOSCOPY;  Service: Endoscopy;  Laterality: N/A;  . SAVORY DILATION N/A 02/11/2016   Procedure: SAVORY DILATION;  Surgeon: Manya Silvas, MD;  Location[facility] ENDOSCOPY;  Service: Endoscopy;  Laterality: N/A;  . SKIN CANCER EXCISION      SOCIAL HISTORY: Social History   Socioeconomic History  . Marital status: Married    Spouse  name: Legrand Como  . Number of children: Not on file  . Years of education: Not on file  . Highest education  level: Not on file  Occupational History  . Not on file  Tobacco Use  . Smoking status: Never Smoker  . Smokeless tobacco: Never Used  Vaping Use  . Vaping Use: Never assessed  Substance and Sexual Activity  . Alcohol use: No    Alcohol/week: 0.0 standard drinks    Comment: MAYBE ONCE OR TWICE A MONTH  . Drug use: No  . Sexual activity: Not Currently    Partners: Male  Other Topics Concern  . Not on file  Social History Narrative   Lives in [place]; taught high school- math; retd; no smoking; social alcohol.    Social Determinants of Health   Financial Resource Strain:   . Difficulty of Paying Living Expenses: Not on file  Food Insecurity:   . Worried About Charity fundraiser in the Last Year: Not on file  . Ran Out of Food in the Last Year: Not on file  Transportation Needs:   . Lack of Transportation (Medical): Not on file  . Lack of Transportation (Non-Medical): Not on file  Physical Activity:   . Days of Exercise per Week: Not on file  . Minutes of Exercise per Session: Not on file  Stress:   . Feeling of Stress : Not on file  Social Connections:   . Frequency of Communication with Friends and Family: Not on file  . Frequency of Social Gatherings with Friends and Family: Not on file  . Attends Religious Services: Not on file  . Active Member of Clubs or Organizations: Not on file  . Attends Archivist Meetings: Not on file  . Marital Status: Not on file  Intimate Partner Violence:   . Fear of Current or Ex-Partner: Not on file  . Emotionally Abused: Not on file  . Physically Abused: Not on file  . Sexually Abused: Not on file    FAMILY HISTORY: Family History  Problem Relation Age of Onset  . Cancer Mother        colon cancer  . Depression Mother   . Anxiety disorder Mother   . Heart disease Father   . Cancer Father        ? neck cancer- still living.   . Anxiety disorder Father   . Depression Father   . Hypoparathyroidism Son   . Colon  cancer Maternal Aunt        died in 85s.     ALLERGIES:  is allergic to atorvastatin, hydrochlorothiazide, parafon forte dsc [chlorzoxazone], pollen extract, and sudafed [pseudoephedrine hcl].  MEDICATIONS:  Current Outpatient Medications  Medication Sig Dispense Refill  . calcitRIOL (ROCALTROL) 0.25 MCG capsule TAKE ONE CAPSULE BY MOUTH DAILY 30 capsule 1  . clonazePAM (KLONOPIN) 0.5 MG tablet Take 0.5-1 tablets (0.25-0.5 mg total) by mouth as directed. Take half to 1 tablet daily up to 2-3 times a week only for severe anxiety 12 tablet 1  . DULoxetine (CYMBALTA) 20 MG capsule Take 1 capsule (20 mg total) by mouth daily. 7 capsule 0  . escitalopram (LEXAPRO) 10 MG tablet Take 0.5-1 tablets (5-10 mg total) by mouth daily. Start taking 5 mg daily ( half tablet)for 1 week and increase to 10 mg daily after that . 30 tablet 1  . fluticasone (FLONASE) 50 MCG/ACT nasal spray     . gabapentin (NEURONTIN) 300 MG capsule TAKE TWO CAPSULES BY MOUTH  EVERY MORNING THEN ONE CAPSULE DAILY AT MIDDAY THEN TWO CAPSULES EVERY EVENING    . hydrOXYzine (VISTARIL) 50 MG capsule Take 1 capsule (50 mg total) by mouth 2 (two) times daily as needed for anxiety. 60 capsule 1  . KLOR-CON M20 20 MEQ tablet TAKE ONE TABLET BY MOUTH TWICE A DAY 14 tablet 0  . levothyroxine (SYNTHROID) 75 MCG tablet Take 1 tablet (75 mcg total) by mouth daily before breakfast. 90 tablet 1  . lisinopril (ZESTRIL) 10 MG tablet Take 10 mg by mouth daily.    Marland Kitchen lovastatin (MEVACOR) 20 MG tablet Take 20 mg by mouth at bedtime.    . ondansetron (ZOFRAN-ODT) 4 MG disintegrating tablet     . pantoprazole (PROTONIX) 40 MG tablet Take 1 tablet (40 mg total) by mouth daily. 30 tablet 1  . propranolol (INNOPRAN XL) 80 MG 24 hr capsule Take by mouth.    . QUEtiapine (SEROQUEL) 100 MG tablet TAKE TWO TABLETS BY MOUTH EVERY NIGHT AT BEDTIME 60 tablet 1  . vitamin B-12 (CYANOCOBALAMIN) 1000 MCG tablet Take by mouth.     Marland Kitchen azelastine (ASTELIN) 0.1 % nasal  spray Place into the nose.     No current facility-administered medications for this visit.      Marland Kitchen  PHYSICAL EXAMINATION: ECOG PERFORMANCE STATUS: 0 - Asymptomatic  Vitals:   03/12/20 1005 03/12/20 1007  BP: 101/78 (!) 99/55  Pulse: 92 92  Resp: 16   Temp: 99.6 F (37.6 C)   SpO2: 98%    Filed Weights   03/12/20 1005  Weight: 177 lb (80.3 kg)    Physical Exam HENT:     Head: Normocephalic and atraumatic.     Mouth/Throat:     Pharynx: No oropharyngeal exudate.  Eyes:     Pupils: Pupils are equal, round, and reactive to light.  Cardiovascular:     Rate and Rhythm: Normal rate and regular rhythm.  Pulmonary:     Effort: Pulmonary effort is normal. No respiratory distress.     Breath sounds: Normal breath sounds. No wheezing.  Abdominal:     General: Bowel sounds are normal. There is no distension.     Palpations: Abdomen is soft. There is no mass.     Tenderness: There is no abdominal tenderness. There is no guarding or rebound.  Musculoskeletal:        General: No tenderness. Normal range of motion.     Cervical back: Normal range of motion and neck supple.  Skin:    General: Skin is warm.  Neurological:     Mental Status: She is alert and oriented to person, place, and time.  Psychiatric:        Mood and Affect: Affect normal.      LABORATORY DATA:  I have reviewed the data as listed Lab Results  Component Value Date   WBC 6.5 02/24/2020   HGB 11.5 (L) 02/24/2020   HCT 33.2 (L) 02/24/2020   MCV 87.4 02/24/2020   PLT 277 02/24/2020   Recent Labs    05/22/19 1534 05/22/19 1534 12/17/19 1542 02/24/20 1214 03/12/20 1043  NA 143  --   --  140 142  K 3.9  --   --  2.7* 3.2*  CL 106  --   --  94* 106  CO2 27  --   --  32 27  GLUCOSE 83  --   --  152* 107*  BUN 17  --   --  21* 16  CREATININE 1.20  --   --  1.08* 1.01*  CALCIUM 9.3   < > 9.4 9.0 8.7*  GFRNONAA  --   --   --  56* >60  GFRAA  --   --   --  >60 >60  PROT 7.6  --   --  7.4 7.0   ALBUMIN 4.6  --   --  4.0 4.0  AST 16  --   --  99* 34  ALT 18  --   --  91* 30  ALKPHOS 97  --   --  120 87  BILITOT 0.4  --   --  0.8 0.4   < > = values in this interval not displayed.    RADIOGRAPHIC STUDIES: I have personally reviewed the radiological images as listed and agreed with the findings in the report. NM PET Image Restage (PS) Whole Body  Result Date: 03/10/2020 CLINICAL DATA:  Subsequent treatment strategy for history of melanoma with lesions of the skull showing sclerosis. EXAM: NUCLEAR MEDICINE PET WHOLE BODY TECHNIQUE: 9.99 mCi F-18 FDG was injected intravenously. Full-ring PET imaging was performed from the head to foot after the radiotracer. CT data was obtained and used for attenuation correction and anatomic localization. Fasting blood glucose: 123 mg/dl COMPARISON:  PET exam from 05/06/2018 FINDINGS: Mediastinal blood pool activity: SUV max 2.67 HEAD/NECK: Sclerotic calvarial lesions showed narrow zone of transition with suggestion of very subtle ground-glass type density. There is no overlying periosteal reaction. Other areas of lucency are demonstrated these also with narrow zone of transition but these are not numerous, 2 of these areas are seen in the LEFT parietal bone. Most of the lesions are either frontal or parietal with respect to the above ground-glass lesions. There is no associated FDG uptake in these areas. One of these areas in the RIGHT frontal region on image 5 of series 3 of the prior study shows no change since previous imaging other areas not imaged on the prior study. No nodal disease in the neck. Incidental CT findings: none CHEST: No hypermetabolic mediastinal or hilar nodes. No suspicious pulmonary nodules on the CT scan. Incidental CT findings: No adenopathy by size criteria. Post retropectoral breast implant placement in the breasts. Similar to the prior study. Heart size is normal. Moderately large hiatal hernia extends into the chest. No pericardial  effusion. No significant atherosclerotic changes. Stable tiny nodule along the LEFT heart border measures 4-5 mm. Mild motion limited assessment of the chest also with stable small nodule along the RIGHT pleural surface measuring approximately 5 mm (image 124, series 3), no associated FDG uptake. ABDOMEN/PELVIS: No abnormal hypermetabolic activity within the liver, pancreas, adrenal glands, or spleen. No hypermetabolic lymph nodes in the abdomen or pelvis. Incidental CT findings: Liver and gallbladder unremarkable on noncontrast imaging. Spleen normal size and contour. The pancreas is normal. Adrenal glands are normal. No hydronephrosis. Moderately large hiatal hernia. Uterus and adnexa are unremarkable. SKELETON: Multiple sclerotic calvarial lesions. Displaying no signs of FDG uptake. No signs of suspicious metabolic activity within the visualized skeleton. Incidental CT findings: Signs of prior ORIF of the RIGHT lateral malleolus. EXTREMITIES: No abnormal hypermetabolic activity in the lower extremities. Incidental CT findings: Postoperative changes about the RIGHT ankle as described. IMPRESSION: 1. Sclerotic lesions in the calvarium with narrow zone of transition and without aggressive features on CT, also showing no metabolic activity on FDG PET. 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 No suspicious lesions are  seen elsewhere. 2. Consider follow-up CT in 3-6 months to document stability. One of these areas was imaged previously on the study of 2019 other areas not evaluated on the prior PET. There is no change in the area that was evaluated previously. Electronically Signed   By: Zetta Bills M.D.   On: 03/10/2020 13:44   DG Bone Survey Met  Result Date: 02/24/2020 CLINICAL DATA:  Skull lesions on MRI. EXAM: METASTATIC BONE SURVEY COMPARISON:  PET CT 05/06/2018. FINDINGS: Scattered calvarial lesions which appear sclerotic. At least 3 lesions are demonstrated  by radiograph, some of the lesions on prior MRI are not well seen radiographically. No evidence of lytic bone lesion in the axial or appendicular skeleton. Bone lesions on prior PET-CT including a lucent lesion in the left femoral head and tiny sclerotic lesions in the right acetabulum and ribs are not well demonstrated by radiograph. There is multilevel degenerative change involving the spine. Mild bilateral hip and knee osteoarthritis. Remote distal fibular surgery. Chain sutures located in the low pelvic midline. IMPRESSION: 1. Scattered calvarial lesions which appear sclerotic. At least 3 lesions are demonstrated by radiograph. Some of the lesions on prior MRI are not well seen radiographically. 2. Additional small sclerotic lesions that were seen on 2019 PET are not well demonstrated by radiograph. Lucent lesion in the left proximal femur on PET is not well down striated by radiograph. Electronically Signed   By: Keith Rake M.D.   On: 02/24/2020 21:06    ASSESSMENT & PLAN:   Skull lesion #Multiple bilateral skull lesions noted; whole-body PET scan AUG 2021- [hx of melanoma]-absence of any uptake-suggestive of benign/underlying process-question polyostotic fibrous dysplasia.  Reviewed my findings with the patient-very low concern for any malignant process.  As per recommendation radiology would recommend a CT scan noncontrast in 6 months to confirm the stability of the lesions.  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  #Hypokalemia severe-potassium 2.7; unclear etiology-patient potassium; recommend compliance.  We will repeat labs today.  #Dizzy/unsteady gait-defer to neurology for further evaluation recommendations.  #Cancer screening : SEP 2019-EGD/colo- [[place]]-polyps; awaiting September 9 screening mammogram.  # DISPOSITION: # labs-cmp today # follow up in 6 months-MD; labs- cbc/cmp; CT brain prior-- Dr.B  # I reviewed the blood work-  with the patient in detail; also reviewed the imaging independently [as summarized above]; and with the patient in detail.    Cc; Dr.Linthavong/Dr.Shah  All questions were answered. The patient knows to call the clinic with any problems, questions or concerns.    Cammie Sickle, MD 03/12/2020 3:59 PM

## 2020-03-13 ENCOUNTER — Other Ambulatory Visit: Payer: Self-pay | Admitting: Psychiatry

## 2020-03-13 DIAGNOSIS — F3175 Bipolar disorder, in partial remission, most recent episode depressed: Secondary | ICD-10-CM

## 2020-03-13 DIAGNOSIS — F411 Generalized anxiety disorder: Secondary | ICD-10-CM

## 2020-03-14 ENCOUNTER — Telehealth: Payer: Self-pay | Admitting: Internal Medicine

## 2020-03-14 MED ORDER — POTASSIUM CHLORIDE CRYS ER 20 MEQ PO TBCR: 20.0000 meq | EXTENDED_RELEASE_TABLET | Freq: Two times a day (BID) | ORAL | 0 refills | Status: DC

## 2020-03-14 NOTE — Telephone Encounter (Signed)
Left a voicemail for the patient-there is potassium is improved at 3.2.  Continue potassium supplementation as planned.   H/T-please inform patient that I have sent a new prescription for potassium for 2 more weeks.  Recommend follow-up with PCP 1 to 2 weeks re: Potassium re-check.  GB  Cc; Dr.Linthavong.

## 2020-03-15 NOTE — Telephone Encounter (Signed)
Pt aware of response below and expressed understanding. Will contact PCP to follow up on potassium levels in 1-2 weeks.

## 2020-03-17 ENCOUNTER — Other Ambulatory Visit: Payer: Self-pay | Admitting: Psychiatry

## 2020-03-17 DIAGNOSIS — F411 Generalized anxiety disorder: Secondary | ICD-10-CM

## 2020-03-17 NOTE — Telephone Encounter (Signed)
Sent Lexapro to pharmacy.

## 2020-03-18 ENCOUNTER — Ambulatory Visit: Payer: Self-pay | Admitting: Endocrinology

## 2020-03-18 ENCOUNTER — Other Ambulatory Visit: Payer: Self-pay

## 2020-03-18 ENCOUNTER — Encounter: Payer: Self-pay | Admitting: Psychiatry

## 2020-03-18 ENCOUNTER — Telehealth (INDEPENDENT_AMBULATORY_CARE_PROVIDER_SITE_OTHER): Payer: Medicare PPO | Admitting: Psychiatry

## 2020-03-18 DIAGNOSIS — R251 Tremor, unspecified: Secondary | ICD-10-CM

## 2020-03-18 DIAGNOSIS — F411 Generalized anxiety disorder: Secondary | ICD-10-CM

## 2020-03-18 DIAGNOSIS — F3130 Bipolar disorder, current episode depressed, mild or moderate severity, unspecified: Secondary | ICD-10-CM

## 2020-03-18 MED ORDER — LAMOTRIGINE 25 MG PO TABS: ORAL_TABLET | ORAL | 0 refills | Status: DC

## 2020-03-18 MED ORDER — ESZOPICLONE 1 MG PO TABS: 1.0000 mg | ORAL_TABLET | Freq: Every evening | ORAL | 0 refills | Status: DC | PRN

## 2020-03-18 NOTE — Progress Notes (Signed)
Provider Location : [facility] Patient Location : Home  Participants: Patient , Provider  Virtual Visit via Video Note  I connected with Kaylee Gordon on 03/18/20 at 11:20 AM EDT by a video enabled telemedicine application and verified that I am speaking with the correct person using two identifiers.   I discussed the limitations of evaluation and management by telemedicine and the availability of in person appointments. The patient expressed understanding and agreed to proceed. I discussed the assessment and treatment plan with the patient. The patient was provided an opportunity to ask questions and all were answered. The patient agreed with the plan and demonstrated an understanding of the instructions.   The patient was advised to call back or seek an in-person evaluation if the symptoms worsen or if the condition fails to improve as anticipated.   [place] MD/PA/NP OP Progress Note  03/18/2020 12:19 PM ISLAM VILLESCAS  MRN:  [id number]  Chief Complaint:  Chief Complaint    Follow-up     HPI: Kaylee Gordon is a 59 year old bipolar disorder, GAD, fibromyalgia, tremors, recent dizzy spells, lives in [place], married, was evaluated by telemedicine today.  Patient today reports she was recently diagnosed with sclerotic lesions in her scalp and dizzy spells.  She had an appointment with neurology for gait instability, dizzy spells and was referred to oncologist.  She however reports all the testing came back negative and it does not look like her lesions are cancerous at this time.  She reports her dizzy spells have improved somehow she continues to struggle with vertigo dizzy spells early in the morning.  She has not had any falls in the past 1 month or so.  Patient reports she however continues to struggle with anxiety, mood lability more so because of her situational stressors.  She reports her 83 year old son recently had hip replacement and is recovering from the same.  She hence is  worried about him.  Patient denies any suicidality, homicidality or perceptual disturbances.  Patient reports she is interested in continuing psychotherapy sessions and has upcoming appointment with therapist Ms. Miguel Dibble.  Patient denies any other concerns today.    Visit Diagnosis:    ICD-10-CM   1. Bipolar I disorder, most recent episode depressed (HCC)  F31.30 lamoTRIgine (LAMICTAL) 25 MG tablet    eszopiclone (LUNESTA) 1 MG TABS tablet  2. GAD (generalized anxiety disorder)  F41.1   3. Tremor  R25.1     Past Psychiatric History: I have reviewed past psychiatric history from my progress note on 09/11/2018  Past Medical History:  Past Medical History:  Diagnosis Date  . Anxiety   . Asthma   . B12 deficiency   . Cancer [facility])    SKIN CANCER  . CHF (congestive heart failure) ([place])   . Depression   . Hypertension   . Thyroid disease     Past Surgical History:  Procedure Laterality Date  . ANKLE SURGERY Right 2012  . APPENDECTOMY    . AUGMENTATION MAMMAPLASTY Bilateral   . BREAST ENHANCEMENT SURGERY    . COLONOSCOPY WITH PROPOFOL N/A 02/22/2015   Procedure: COLONOSCOPY WITH PROPOFOL;  Surgeon: Josefine Class, MD;  Location: [facility] ENDOSCOPY;  Service: Endoscopy;  Laterality: N/A;  . ESOPHAGOGASTRODUODENOSCOPY (EGD) WITH PROPOFOL N/A 02/22/2015   Procedure: ESOPHAGOGASTRODUODENOSCOPY (EGD) WITH PROPOFOL;  Surgeon: Josefine Class, MD;  Location: [facility] ENDOSCOPY;  Service: Endoscopy;  Laterality: N/A;  . ESOPHAGOGASTRODUODENOSCOPY (EGD) WITH PROPOFOL N/A 02/11/2016   Procedure: ESOPHAGOGASTRODUODENOSCOPY (EGD) WITH PROPOFOL;  Surgeon: Manya Silvas, MD;  Location: [facility] ENDOSCOPY;  Service: Endoscopy;  Laterality: N/A;  . HEMORRHOID SURGERY    . NASAL SINUS SURGERY    . SAVORY DILATION N/A 02/22/2015   Procedure: SAVORY DILATION;  Surgeon: Josefine Class, MD;  Location: [facility] ENDOSCOPY;  Service: Endoscopy;  Laterality: N/A;  . SAVORY DILATION N/A 02/11/2016    Procedure: SAVORY DILATION;  Surgeon: Manya Silvas, MD;  Location: [facility] ENDOSCOPY;  Service: Endoscopy;  Laterality: N/A;  . SKIN CANCER EXCISION      Family Psychiatric History: I have reviewed family psychiatric history from my progress note from 09/11/2018  Family History:  Family History  Problem Relation Age of Onset  . Cancer Mother        colon cancer  . Depression Mother   . Anxiety disorder Mother   . Heart disease Father   . Cancer Father        ? neck cancer- still living.   . Anxiety disorder Father   . Depression Father   . Hypoparathyroidism Son   . Colon cancer Maternal Aunt        died in 41s.     Social History: I have reviewed social history from my progress note on 09/11/2018 Social History   Socioeconomic History  . Marital status: Married    Spouse name: Legrand Como  . Number of children: Not on file  . Years of education: Not on file  . Highest education level: Not on file  Occupational History  . Not on file  Tobacco Use  . Smoking status: Never Smoker  . Smokeless tobacco: Never Used  Vaping Use  . Vaping Use: Never assessed  Substance and Sexual Activity  . Alcohol use: No    Alcohol/week: 0.0 standard drinks    Comment: MAYBE ONCE OR TWICE A MONTH  . Drug use: No  . Sexual activity: Not Currently    Partners: Male  Other Topics Concern  . Not on file  Social History Narrative   Lives in [place]; taught high school- math; retd; no smoking; social alcohol.    Social Determinants of Health   Financial Resource Strain:   . Difficulty of Paying Living Expenses: Not on file  Food Insecurity:   . Worried About Charity fundraiser in the Last Year: Not on file  . Ran Out of Food in the Last Year: Not on file  Transportation Needs:   . Lack of Transportation (Medical): Not on file  . Lack of Transportation (Non-Medical): Not on file  Physical Activity:   . Days of Exercise per Week: Not on file  . Minutes of Exercise per Session: Not  on file  Stress:   . Feeling of Stress : Not on file  Social Connections:   . Frequency of Communication with Friends and Family: Not on file  . Frequency of Social Gatherings with Friends and Family: Not on file  . Attends Religious Services: Not on file  . Active Member of Clubs or Organizations: Not on file  . Attends Archivist Meetings: Not on file  . Marital Status: Not on file    Allergies:  Allergies  Allergen Reactions  . Atorvastatin   . Hydrochlorothiazide Other (See Comments)    Intolerance - dry lips  . Parafon Forte Dsc [Chlorzoxazone]   . Pollen Extract   . Sudafed [Pseudoephedrine Hcl]     Heart racing    Metabolic Disorder Labs: No results found for: HGBA1C, MPG Lab  Results  Component Value Date   PROLACTIN 18.3 07/22/2014   No results found for: CHOL, TRIG, HDL, CHOLHDL, VLDL, LDLCALC Lab Results  Component Value Date   TSH 3.95 12/17/2019   TSH 3.66 05/22/2019    Therapeutic Level Labs: No results found for: LITHIUM Lab Results  Component Value Date   VALPROATE 39 (L) 02/04/2019   No components found for:  CBMZ  Current Medications: Current Outpatient Medications  Medication Sig Dispense Refill  . azelastine (ASTELIN) 0.1 % nasal spray Place into the nose.    . calcitRIOL (ROCALTROL) 0.25 MCG capsule TAKE ONE CAPSULE BY MOUTH DAILY 30 capsule 1  . escitalopram (LEXAPRO) 10 MG tablet Take 1 tablet (10 mg total) by mouth daily. 30 tablet 1  . fluticasone (FLONASE) 50 MCG/ACT nasal spray     . gabapentin (NEURONTIN) 300 MG capsule Take 300 mg by mouth. 2 CAP AM, 1 CAP Q noon, 2 CAP pm,    . hydrOXYzine (VISTARIL) 50 MG capsule Take 1 capsule (50 mg total) by mouth 2 (two) times daily as needed for anxiety. 60 capsule 1  . levothyroxine (SYNTHROID) 75 MCG tablet Take 1 tablet (75 mcg total) by mouth daily before breakfast. 90 tablet 1  . lisinopril (ZESTRIL) 10 MG tablet Take 10 mg by mouth daily.    Marland Kitchen lovastatin (MEVACOR) 20 MG tablet  Take 20 mg by mouth at bedtime.    . ondansetron (ZOFRAN-ODT) 4 MG disintegrating tablet     . pantoprazole (PROTONIX) 40 MG tablet Take 1 tablet (40 mg total) by mouth daily. 30 tablet 1  . potassium chloride SA (KLOR-CON M20) 20 MEQ tablet Take 1 tablet (20 mEq total) by mouth 2 (two) times daily. 30 tablet 0  . propranolol (INNOPRAN XL) 80 MG 24 hr capsule Take by mouth.    . propranolol ER (INDERAL LA) 80 MG 24 hr capsule     . vitamin B-12 (CYANOCOBALAMIN) 1000 MCG tablet Take by mouth.     . clonazePAM (KLONOPIN) 0.5 MG tablet Take 0.5-1 tablets (0.25-0.5 mg total) by mouth as directed. Take half to 1 tablet daily up to 2-3 times a week only for severe anxiety (Patient not taking: Reported on 03/18/2020) 12 tablet 1  . eszopiclone (LUNESTA) 1 MG TABS tablet Take 1 tablet (1 mg total) by mouth at bedtime as needed for sleep. Take immediately before bedtime 30 tablet 0  . lamoTRIgine (LAMICTAL) 25 MG tablet Take 1 tablet (25 mg total) by mouth daily for 15 days, THEN 1 tablet (25 mg total) 2 (two) times daily for 15 days. 45 tablet 0   No current facility-administered medications for this visit.     Musculoskeletal: Strength & Muscle Tone: UTA Gait & Station: normal Patient leans: N/A  Psychiatric Specialty Exam: Review of Systems  Neurological: Positive for dizziness.  Psychiatric/Behavioral: Positive for dysphoric mood. The patient is nervous/anxious.   All other systems reviewed and are negative.   There were no vitals taken for this visit.There is no height or weight on file to calculate BMI.  General Appearance: Casual  Eye Contact:  Fair  Speech:  Clear and Coherent  Volume:  Normal  Mood:  Anxious  Affect:  Congruent  Thought Process:  Goal Directed and Descriptions of Associations: Intact  Orientation:  Full (Time, Place, and Person)  Thought Content: Logical   Suicidal Thoughts:  No  Homicidal Thoughts:  No  Memory:  Immediate;   Fair Recent;   Fair Remote;   Fair  Judgement:  Fair  Insight:  Fair  Psychomotor Activity:  Normal  Concentration:  Concentration: Fair and Attention Span: Fair  Recall:  [employer] of Knowledge: Fair  Language: Fair  Akathisia:  No  Handed:  Right  AIMS (if indicated):UTA  Assets:  Communication Skills Desire for Improvement Housing Social Support  ADL's:  Intact  Cognition: WNL  Sleep:  Fair   Screenings:   Assessment and Plan: Kaylee Gordon is a 59 year old Caucasian female, married, lives in group single, has a history of bipolar disorder, anxiety disorder, fibromyalgia, IBS was evaluated by telemedicine today.  Patient is currently struggling with dizziness, recurrent falls as well as anxiety symptoms.  Patient will benefit from medication readjustment.  Plan Bipolar disorder in partial remission We will discontinue Seroquel due to dizzy spells and recent falls. Start Lamictal 25 mg p.o. daily for 15 days and increase to 25 mg p.o. twice daily after that. Hydroxyzine 50 mg p.o. twice daily as needed for anxiety. Gabapentin as prescribed by pain provider which is also a mood stabilizer. Continue Lexapro 10 mg p.o. daily. Start Lunesta 1 mg p.o. nightly for sleep, since she is going to discontinue Seroquel.  GAD-improving Continue CBT with Ms. Miguel Dibble Lexapro 10 mg p.o. daily Klonopin 0.25 to 0.5 mg as needed for severe anxiety attacks only.  Patient is limiting use.  Tremors-improving Continue Cogentin as needed, however will discontinue it once she is completely off of the Seroquel.  Follow-up in clinic in 4 weeks or sooner if needed.  I have spent atleast 20 minutes face to face with patient today. More than 50 % of the time was spent for preparing to see the patient ( e.g., review of test, records ), ordering medications and test ,psychoeducation and supportive psychotherapy and care coordination,as well as documenting clinical information in electronic health record. This note was  generated in part or whole with voice recognition software. Voice recognition is usually quite accurate but there are transcription errors that can and very often do occur. I apologize for any typographical errors that were not detected and corrected.      Ursula Alert, MD 03/18/2020, 12:19 PM

## 2020-03-23 ENCOUNTER — Telehealth: Payer: Self-pay

## 2020-03-23 NOTE — Telephone Encounter (Signed)
Returned call to patient.  She is not sleeping on the Lunesta 1 mg. Discussed increasing Lunesta to 3 mg at night.  She will give writer call back if it does not work Architectural technologist a.m.

## 2020-03-23 NOTE — Telephone Encounter (Signed)
pt called left message that she is not sleeping is there anything else she can try.

## 2020-03-25 ENCOUNTER — Inpatient Hospital Stay: Admission: RE | Admit: 2020-03-25 | Payer: Medicare PPO | Source: Ambulatory Visit

## 2020-03-30 ENCOUNTER — Telehealth (HOSPITAL_COMMUNITY): Payer: Self-pay | Admitting: *Deleted

## 2020-03-30 DIAGNOSIS — F3130 Bipolar disorder, current episode depressed, mild or moderate severity, unspecified: Secondary | ICD-10-CM

## 2020-03-30 DIAGNOSIS — G4700 Insomnia, unspecified: Secondary | ICD-10-CM

## 2020-03-30 DIAGNOSIS — F411 Generalized anxiety disorder: Secondary | ICD-10-CM

## 2020-03-30 MED ORDER — ESZOPICLONE 3 MG PO TABS: 3.0000 mg | ORAL_TABLET | Freq: Every day | ORAL | 1 refills | Status: DC

## 2020-03-30 NOTE — Telephone Encounter (Signed)
Patient reports that she is taking Lunesta 3mg  a night to sleep. She would like a script for 54m. It is currently ordered 1mg  qhs. Please revieve and advise.

## 2020-03-30 NOTE — Telephone Encounter (Signed)
I have sent Lunesta to pharmacy. 

## 2020-04-04 ENCOUNTER — Other Ambulatory Visit: Payer: Self-pay | Admitting: Internal Medicine

## 2020-04-05 ENCOUNTER — Ambulatory Visit: Payer: Medicare PPO | Admitting: Endocrinology

## 2020-04-05 ENCOUNTER — Other Ambulatory Visit (INDEPENDENT_AMBULATORY_CARE_PROVIDER_SITE_OTHER): Payer: Medicare PPO

## 2020-04-05 ENCOUNTER — Other Ambulatory Visit: Payer: Self-pay

## 2020-04-05 VITALS — BP 118/70 | HR 89 | Ht 60.0 in | Wt 177.0 lb

## 2020-04-05 DIAGNOSIS — E200 Idiopathic hypoparathyroidism: Secondary | ICD-10-CM | POA: Diagnosis not present

## 2020-04-05 DIAGNOSIS — R5383 Other fatigue: Secondary | ICD-10-CM

## 2020-04-05 DIAGNOSIS — E038 Other specified hypothyroidism: Secondary | ICD-10-CM

## 2020-04-05 LAB — T4, FREE: Free T4: 1.01 ng/dL (ref 0.60–1.60)

## 2020-04-05 LAB — TSH: TSH: 1.14 u[IU]/mL (ref 0.35–4.50)

## 2020-04-05 LAB — RENAL FUNCTION PANEL
Albumin: 4.6 g/dL (ref 3.5–5.2)
BUN: 21 mg/dL (ref 6–23)
CO2: 23 mEq/L (ref 19–32)
Calcium: 9.1 mg/dL (ref 8.4–10.5)
Chloride: 106 mEq/L (ref 96–112)
Creatinine, Ser: 1.16 mg/dL (ref 0.40–1.20)
GFR: 47.56 mL/min — ABNORMAL LOW (ref >60.00)
Glucose, Bld: 124 mg/dL — ABNORMAL HIGH (ref 70–99)
Phosphorus: 4.3 mg/dL (ref 2.3–4.6)
Potassium: 4.2 mEq/L (ref 3.5–5.1)
Sodium: 140 mEq/L (ref 135–145)

## 2020-04-05 NOTE — Progress Notes (Signed)
Please call to let patient know that the lab results are normal and no further action needed

## 2020-04-05 NOTE — Progress Notes (Signed)
Patient ID: Kaylee Gordon, female   DOB: 11-25-1960, 59 y.o.   MRN: [id number]    History of Present Illness:  Chief complaint: F/u    Hypothyroidism:  She was initially given thyroxine supplement with a borderline TSH levels in 2011 However because of complaints of  fatigue on her visit in 10/15 she had thyroid levels done and free T4 was significantly low She was empirically started on 25 g  of levothyroxine Initially she felt less tired with this; however her free T4 continue to be low and her dose has been increased to 75 g since 1/16 when her free T4 was still low at 0.59  She is now taking 75 mcg levothyroxine since her free T4 was relatively low in 6/21  She has intermittent fatigue which is again worse more recently for other reasons She appears to have lost weight from previous visit  Also having difficulty sleeping She may sometimes may feel cold  She is taking her levothyroxine daily in the morning before breakfast . She takes her calcium in the evening after dinner  No recent labs available since 6/21   Wt Readings from Last 3 Encounters:  04/05/20 177 lb (80.3 kg)  03/12/20 177 lb (80.3 kg)  02/24/20 176 lb (79.8 kg)    Lab Results  Component Value Date   FREET4 1.01 04/05/2020   FREET4 0.70 12/17/2019   FREET4 0.84 05/22/2019   TSH 1.14 04/05/2020   TSH 3.95 12/17/2019   TSH 3.66 05/22/2019    Hypoparathyroidism    Has had hypocalcemia since she was 59 years old and presented with symptoms of cramping in her hands along with twitching of her face and eyes. 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   No recurrence of twitching or tingling in her face, muscle cramping in her hands.   She is taking calcitriol 0.25 mcg daily in the morning and also her calcium tablet which has 1200 mg once daily after dinner She takes her supplements very regularly  Last calcium normal done recently from PCP  at 9.5  Lab Results  Component Value Date   CALCIUM 9.1 04/05/2020   CALCIUM 8.7 (L) 03/12/2020   CALCIUM 9.0 02/24/2020   CALCIUM 9.4 12/17/2019   CALCIUM 9.3 05/22/2019   Lab Results  Component Value Date   K 4.2 04/05/2020      Allergies as of 04/05/2020      Reactions   Atorvastatin    Hydrochlorothiazide Other (See Comments)   Intolerance - dry lips   Parafon Forte Dsc [chlorzoxazone]    Pollen Extract    Sudafed [pseudoephedrine Hcl]    Heart racing      Medication List       Accurate as of April 05, 2020  9:03 PM. If you have any questions, ask your nurse or doctor.        azelastine 0.1 % nasal spray Commonly known as: ASTELIN Place into the nose.   calcitRIOL 0.25 MCG capsule Commonly known as: ROCALTROL TAKE ONE CAPSULE BY MOUTH DAILY   clonazePAM 0.5 MG tablet Commonly known as: KlonoPIN Take 0.5-1 tablets (0.25-0.5 mg total) by mouth as directed. Take half to 1 tablet daily up to 2-3 times a week only for severe anxiety   escitalopram 10 MG tablet Commonly known as: LEXAPRO Take 1 tablet (10 mg total) by mouth daily.   Eszopiclone 3 MG Tabs Take 1 tablet (  3 mg total) by mouth at bedtime. Take immediately before bedtime   fluticasone 50 MCG/ACT nasal spray Commonly known as: FLONASE   gabapentin 300 MG capsule Commonly known as: NEURONTIN Take 300 mg by mouth. 2 CAP AM, 1 CAP Q noon, 2 CAP pm,   hydrOXYzine 50 MG capsule Commonly known as: Vistaril Take 1 capsule (50 mg total) by mouth 2 (two) times daily as needed for anxiety.   lamoTRIgine 25 MG tablet Commonly known as: LaMICtal Take 1 tablet (25 mg total) by mouth daily for 15 days, THEN 1 tablet (25 mg total) 2 (two) times daily for 15 days. Start taking on: March 18, 2020   levothyroxine 75 MCG tablet Commonly known as: SYNTHROID Take 1 tablet (75 mcg total) by mouth daily before breakfast.   lisinopril 10 MG tablet Commonly known as: ZESTRIL Take 10 mg by mouth  daily.   lovastatin 20 MG tablet Commonly known as: MEVACOR Take 20 mg by mouth at bedtime.   ondansetron 4 MG disintegrating tablet Commonly known as: ZOFRAN-ODT   pantoprazole 40 MG tablet Commonly known as: PROTONIX Take 1 tablet (40 mg total) by mouth daily.   potassium chloride SA 20 MEQ tablet Commonly known as: Klor-Con M20 Take 1 tablet (20 mEq total) by mouth 2 (two) times daily.   propranolol 80 MG 24 hr capsule Commonly known as: INNOPRAN XL Take by mouth.   propranolol ER 80 MG 24 hr capsule Commonly known as: INDERAL LA   vitamin B-12 1000 MCG tablet Commonly known as: CYANOCOBALAMIN Take by mouth.       Allergies:  Allergies  Allergen Reactions  . Atorvastatin   . Hydrochlorothiazide Other (See Comments)    Intolerance - dry lips  . Parafon Forte Dsc [Chlorzoxazone]   . Pollen Extract   . Sudafed [Pseudoephedrine Hcl]     Heart racing    Past Medical History:  Diagnosis Date  . Anxiety   . Asthma   . B12 deficiency   . Cancer [facility])    SKIN CANCER  . CHF (congestive heart failure) ([place])   . Depression   . Hypertension   . Thyroid disease     Past Surgical History:  Procedure Laterality Date  . ANKLE SURGERY Right 2012  . APPENDECTOMY    . AUGMENTATION MAMMAPLASTY Bilateral   . BREAST ENHANCEMENT SURGERY    . COLONOSCOPY WITH PROPOFOL N/A 02/22/2015   Procedure: COLONOSCOPY WITH PROPOFOL;  Surgeon: Josefine Class, MD;  Location: [facility] ENDOSCOPY;  Service: Endoscopy;  Laterality: N/A;  . ESOPHAGOGASTRODUODENOSCOPY (EGD) WITH PROPOFOL N/A 02/22/2015   Procedure: ESOPHAGOGASTRODUODENOSCOPY (EGD) WITH PROPOFOL;  Surgeon: Josefine Class, MD;  Location: [facility] ENDOSCOPY;  Service: Endoscopy;  Laterality: N/A;  . ESOPHAGOGASTRODUODENOSCOPY (EGD) WITH PROPOFOL N/A 02/11/2016   Procedure: ESOPHAGOGASTRODUODENOSCOPY (EGD) WITH PROPOFOL;  Surgeon: Manya Silvas, MD;  Location: [facility] ENDOSCOPY;  Service: Endoscopy;  Laterality: N/A;  .  HEMORRHOID SURGERY    . NASAL SINUS SURGERY    . SAVORY DILATION N/A 02/22/2015   Procedure: SAVORY DILATION;  Surgeon: Josefine Class, MD;  Location: [facility] ENDOSCOPY;  Service: Endoscopy;  Laterality: N/A;  . SAVORY DILATION N/A 02/11/2016   Procedure: SAVORY DILATION;  Surgeon: Manya Silvas, MD;  Location: [facility] ENDOSCOPY;  Service: Endoscopy;  Laterality: N/A;  . SKIN CANCER EXCISION      Family History  Problem Relation Age of Onset  . Cancer Mother        colon cancer  . Depression Mother   .  Anxiety disorder Mother   . Heart disease Father   . Cancer Father        ? neck cancer- still living.   . Anxiety disorder Father   . Depression Father   . Hypoparathyroidism Son   . Colon cancer Maternal Aunt        died in 76s.     Social History:  reports that she has never smoked. She has never used smokeless tobacco. She reports that she does not drink alcohol and does not use drugs.  Review of Systems    History of depression, is on duloxetine, gabapentin and Seroquel long-term  She takes vitamin B12 supplements, last B12 level normal  Recently has been normal a diet using supplements and protein shakes and restriction of certain foods but she thinks with this she is feeling more tired and having diarrhea Also over the last month has had hypokalemia of unclear etiology, not on diuretics, recently labs by PCP looked back to normal  EXAM:  BP 118/70   Pulse 89   Ht 5' (1.524 m)   Wt 177 lb (80.3 kg)   SpO2 99%   BMI 34.57 kg/m   Biceps reflexes normal  Assessment/Plan:    Hypoparathyroidism: Her symptoms are well controlled with 0.25 mcg of calcitriol and one calcium tablet daily Recent calcium normal  Secondary HYPOTHYROIDISM: She has had increasing fatigue but no other symptoms suggestive of hypothyroidism Has had other concomitant problems  Currently only on 75 mcg levothyroxine; needs follow-up  Recent diarrhea, fatigue with unknown diet regimen:  Discussed that since she is subjectively not doing well and had already lost weight before starting this diet she needs to get back to normal meals with trying to improve her control of carbohydrates and sweets  Will recheck potassium also today but likely can stop supplement since her last potassium was 4.4   Elayne Snare 04/05/2020, 9:03 PM   Addendum: Labs all normal and she can continue same regimen

## 2020-04-05 NOTE — Patient Instructions (Signed)
Stop potassium.

## 2020-04-14 ENCOUNTER — Other Ambulatory Visit: Payer: Self-pay | Admitting: Psychiatry

## 2020-04-14 DIAGNOSIS — F3130 Bipolar disorder, current episode depressed, mild or moderate severity, unspecified: Secondary | ICD-10-CM

## 2020-04-16 ENCOUNTER — Encounter: Payer: Self-pay | Admitting: Psychiatry

## 2020-04-16 ENCOUNTER — Other Ambulatory Visit: Payer: Self-pay

## 2020-04-16 ENCOUNTER — Telehealth (INDEPENDENT_AMBULATORY_CARE_PROVIDER_SITE_OTHER): Payer: Medicare PPO | Admitting: Psychiatry

## 2020-04-16 DIAGNOSIS — G4701 Insomnia due to medical condition: Secondary | ICD-10-CM

## 2020-04-16 DIAGNOSIS — F3130 Bipolar disorder, current episode depressed, mild or moderate severity, unspecified: Secondary | ICD-10-CM

## 2020-04-16 DIAGNOSIS — F411 Generalized anxiety disorder: Secondary | ICD-10-CM

## 2020-04-16 MED ORDER — HYDROXYZINE PAMOATE 50 MG PO CAPS: 50.0000 mg | ORAL_CAPSULE | Freq: Two times a day (BID) | ORAL | 1 refills | Status: DC | PRN

## 2020-04-16 MED ORDER — LAMOTRIGINE 25 MG PO TABS: 75.0000 mg | ORAL_TABLET | Freq: Every day | ORAL | 1 refills | Status: DC

## 2020-04-16 MED ORDER — ESCITALOPRAM OXALATE 10 MG PO TABS: 15.0000 mg | ORAL_TABLET | Freq: Every day | ORAL | 1 refills | Status: DC

## 2020-04-16 MED ORDER — CLONAZEPAM 0.5 MG PO TABS: 0.2500 mg | ORAL_TABLET | ORAL | 1 refills | Status: DC

## 2020-04-16 NOTE — Progress Notes (Signed)
This note is not being shared with the patient for the following reason: To prevent harm (release of this note would result in harm to the life or physical safety of the patient or another).  Provider Location : [facility] Patient Location : Home  Participants: Patient , Provider  Virtual Visit via Video Note  I connected with Kaylee Gordon on 04/16/20 at 10:15 AM EDT by a video enabled telemedicine application and verified that I am speaking with the correct person using two identifiers.   I discussed the limitations of evaluation and management by telemedicine and the availability of in person appointments. The patient expressed understanding and agreed to proceed.   I discussed the assessment and treatment plan with the patient. The patient was provided an opportunity to ask questions and all were answered. The patient agreed with the plan and demonstrated an understanding of the instructions.   The patient was advised to call back or seek an in-person evaluation if the symptoms worsen or if the condition fails to improve as anticipated.   [facility] OP Progress Note  04/16/2020 12:26 PM Kaylee Gordon  MRN:  [id number]  Chief Complaint:  Chief Complaint    Follow-up     HPI: Kaylee Gordon is a 59 year old Caucasian female who is married, lives in [place], has a history of bipolar disorder, GAD, fibromyalgia, multiple medical problems was evaluated by telemedicine today.  Patient today reports she has been feeling more depressed lately.  She reports sadness, low energy, lack of motivation.  She also reports feeling overwhelmed and extremely anxious.  She reports she is currently having some relationship struggles with her husband who is working from home.  She reports since he is working from home she does not have a lot of space and that also kind of makes her overwhelmed and anxious.  She reports she was sleeping okay until last night on the Lunesta higher dosage.  However last  night she could not sleep.  She wonders whether this is also due to her fibromyalgia flareup which has started getting worse since the change in season recently.  Patient does report having some passive suicidal thoughts recently.  She denies any active plans.  She reports she thinks about why she is alive and it would be better if she is not here anymore.  She however reports she will talk to her best friend if her thoughts come back.  She also agrees to go to the nearest emergency department or crisis hotline.  Collateral information was obtained from husband-Michael-discussed with him concerns about patient's recent suicidality.  Husband wonders whether patient is having problems from her being on multiple medications.  Answered his questions.  Discussed with him that medications does have side effects and sometimes can also cause suicidality.  Advised him to monitor patient closely and be supportive and to get her help if she needs it.  Has been agreed with plan.  Also discussed with patient to reach out to Ms. Miguel Dibble.  Visit Diagnosis:    ICD-10-CM   1. Bipolar I disorder, most recent episode depressed (HCC)  F31.30 lamoTRIgine (LAMICTAL) 25 MG tablet  2. GAD (generalized anxiety disorder)  F41.1 escitalopram (LEXAPRO) 10 MG tablet    clonazePAM (KLONOPIN) 0.5 MG tablet  3. 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 Anxiety   . Asthma   . B12 deficiency   . Cancer [facility])    SKIN CANCER  . CHF (congestive heart failure) ([place])   . Depression   . Hypertension   . Thyroid disease     Past Surgical History:  Procedure Laterality Date  . ANKLE SURGERY Right 2012  . APPENDECTOMY    . AUGMENTATION MAMMAPLASTY Bilateral   . BREAST ENHANCEMENT SURGERY    . COLONOSCOPY  WITH PROPOFOL N/A 02/22/2015   Procedure: COLONOSCOPY WITH PROPOFOL;  Surgeon: Josefine Class, MD;  Location[facility] END[facility]COPY;  Service: Endoscopy;  Laterality: N/A;  . ESOPHAGOGASTRODUODENOSCOPY (EGD) WITH PROPOFOL N/A 02/22/2015   Procedure: ESOPHAGOGASTRODUODENOSCOPY (EGD) WITH PROPOFOL;  Surgeon: Josefine Class, MD;  Location: [facility] ENDOSCOPY;  Service: Endoscopy;  Laterality: N/A;  . ESOPHAGOGASTRODUODENOSCOPY (EGD) WITH PROPOFOL N/A 02/11/2016   Procedure: ESOPHAGOGASTRODUODENOSCOPY (EGD) WITH PROPOFOL;  Surgeon: Manya Silvas, MD;  Location: [facility] ENDOSCOPY;  Service: Endoscopy;  Laterality: N/A;  . HEMORRHOID SURGERY    . NASAL SINUS SURGERY    . SAVORY DILATION N/A 02/22/2015   Procedure: SAVORY DILATION;  Surgeon: Josefine Class, MD;  Location: [facility] ENDOSCOPY;  Service: Endoscopy;  Laterality: N/A;  . SAVORY DILATION N/A 02/11/2016   Procedure: SAVORY DILATION;  Surgeon: Manya Silvas, MD;  Location: [facility] ENDOSCOPY;  Service: Endoscopy;  Laterality: N/A;  . SKIN CANCER EXCISION      Family Psychiatric History: I have reviewed family psychiatric history from my progress note on 09/11/2018  Family History:  Family History  Problem Relation Age of Onset  . Cancer Mother        colon cancer  . Depression Mother   . Anxiety disorder Mother   . Heart disease Father   . Cancer Father        ? neck cancer- still living.   . Anxiety disorder Father   . Depression Father   . Hypoparathyroidism Son   . Colon cancer Maternal Aunt        died in 63s.     Social History: I have reviewed social history from my progress note on 09/11/2018 Social History   Socioeconomic History  . Marital status: Married    Spouse name: Legrand Como  . Number of children: Not on file  . Years of education: Not on file  . Highest education level: Not on file  Occupational History  . Not on file  Tobacco Use  . Smoking status: Never Smoker  . Smokeless tobacco: Never Used  Vaping Use  .  Vaping Use: Never assessed  Substance and Sexual Activity  . Alcohol use: No    Alcohol/week: 0.0 standard drinks    Comment: MAYBE ONCE OR TWICE A MONTH  . Drug use: No  . Sexual activity: Not Currently    Partners: Male  Other Topics Concern  . Not on file  Social History Narrative   Lives in [place]; taught high school- math; retd; no smoking; social alcohol.    Social Determinants of Health   Financial Resource Strain:   . Difficulty of Paying Living Expenses: Not on file  Food Insecurity:   . Worried About Charity fundraiser in the Last Year: Not on file  . Ran Out of Food in the Last Year: Not on file  Transportation Needs:   . Lack of Transportation (Medical): Not on file  . Lack of Transportation (Non-Medical): Not on file  Physical Activity:   .  Days of Exercise per Week: Not on file  . Minutes of Exercise per Session: Not on file  Stress:   . Feeling of Stress : Not on file  Social Connections:   . Frequency of Communication with Friends and Family: Not on file  . Frequency of Social Gatherings with Friends and Family: Not on file  . Attends Religious Services: Not on file  . Active Member of Clubs or Organizations: Not on file  . Attends Archivist Meetings: Not on file  . Marital Status: Not on file    Allergies:  Allergies  Allergen Reactions  . Atorvastatin   . Hydrochlorothiazide Other (See Comments)    Intolerance - dry lips  . Parafon Forte Dsc [Chlorzoxazone]   . Pollen Extract   . Sudafed [Pseudoephedrine Hcl]     Heart racing    Metabolic Disorder Labs: No results found for: HGBA1C, MPG Lab Results  Component Value Date   PROLACTIN 18.3 07/22/2014   No results found for: CHOL, TRIG, HDL, CHOLHDL, VLDL, LDLCALC Lab Results  Component Value Date   TSH 1.14 04/05/2020   TSH 3.95 12/17/2019    Therapeutic Level Labs: No results found for: LITHIUM Lab Results  Component Value Date   VALPROATE 39 (L) 02/04/2019   No  components found for:  CBMZ  Current Medications: Current Outpatient Medications  Medication Sig Dispense Refill  . azelastine (ASTELIN) 0.1 % nasal spray Place into the nose.    . calcitRIOL (ROCALTROL) 0.25 MCG capsule TAKE ONE CAPSULE BY MOUTH DAILY 30 capsule 1  . clonazePAM (KLONOPIN) 0.5 MG tablet Take 0.5-1 tablets (0.25-0.5 mg total) by mouth as directed. Take half to 1 tablet daily up to 2-3 times a week only for severe anxiety 12 tablet 1  . escitalopram (LEXAPRO) 10 MG tablet Take 1.5 tablets (15 mg total) by mouth daily. 45 tablet 1  . Eszopiclone 3 MG TABS Take 1 tablet (3 mg total) by mouth at bedtime. Take immediately before bedtime 30 tablet 1  . fluticasone (FLONASE) 50 MCG/ACT nasal spray     . gabapentin (NEURONTIN) 300 MG capsule Take 300 mg by mouth. 2 CAP AM, 1 CAP Q noon, 2 CAP pm,    . hydrOXYzine (VISTARIL) 50 MG capsule Take 1 capsule (50 mg total) by mouth 2 (two) times daily as needed for anxiety. 60 capsule 1  . lamoTRIgine (LAMICTAL) 25 MG tablet Take 3 tablets (75 mg total) by mouth daily. Start taking 1 tablet daily in the AM and 2 tablets daily PM 90 tablet 1  . levothyroxine (SYNTHROID) 75 MCG tablet Take 1 tablet (75 mcg total) by mouth daily before breakfast. 90 tablet 1  . lisinopril (ZESTRIL) 10 MG tablet Take 10 mg by mouth daily.    Marland Kitchen lovastatin (MEVACOR) 20 MG tablet Take 20 mg by mouth at bedtime.    . ondansetron (ZOFRAN-ODT) 4 MG disintegrating tablet     . pantoprazole (PROTONIX) 40 MG tablet Take 1 tablet (40 mg total) by mouth daily. 30 tablet 1  . potassium chloride SA (KLOR-CON M20) 20 MEQ tablet Take 1 tablet (20 mEq total) by mouth 2 (two) times daily. 30 tablet 0  . propranolol (INNOPRAN XL) 80 MG 24 hr capsule Take by mouth.    . propranolol ER (INDERAL LA) 80 MG 24 hr capsule     . vitamin B-12 (CYANOCOBALAMIN) 1000 MCG tablet Take by mouth.      No current facility-administered medications for this visit.  Musculoskeletal: Strength & Muscle Tone: UTA Gait & Station: normal Patient leans: N/A  Psychiatric Specialty Exam: Review of Systems  Musculoskeletal: Positive for arthralgias, back pain and myalgias.  Psychiatric/Behavioral: Positive for decreased concentration, dysphoric mood, sleep disturbance and suicidal ideas. The patient is nervous/anxious.   All other systems reviewed and are negative.   There were no vitals taken for this visit.There is no height or weight on file to calculate BMI.  General Appearance: Casual  Eye Contact:  Fair  Speech:  Clear and Coherent  Volume:  Normal  Mood:  Anxious and Depressed  Affect:  Congruent  Thought Process:  Goal Directed and Descriptions of Associations: Intact  Orientation:  Full (Time, Place, and Person)  Thought Content: Logical   Suicidal Thoughts:  Passive death wish, wonders why she is here , denies plan  Homicidal Thoughts:  No  Memory:  Immediate;   Fair Recent;   Fair Remote;   Fair  Judgement:  Fair  Insight:  Fair  Psychomotor Activity:  Normal  Concentration:  Concentration: Fair and Attention Span: Fair  Recall:  [employer] of Knowledge: Fair  Language: Fair  Akathisia:  No  Handed:  Right  AIMS (if indicated): UTA  Assets:  Communication Skills Desire for Improvement Housing Social Support  ADL's:  Intact  Cognition: WNL  Sleep:  restless   Screenings:   Assessment and Plan: Kaylee Gordon is a 59 year old Caucasian female, married, lives in [place], has a history of bipolar disorder, anxiety disorder, fibromyalgia, IBS was evaluated by telemedicine today.  Patient is currently struggling with worsening depressive symptoms, anxiety and sleep issues.  She will benefit from the following medication readjustment. Risk factors for suicide-unstable mental health problems, recent suicidality, relationship struggles, chronic pain Protective factors-has social support system, agrees to get help, is  motivated to be in therapy and on medications, husband agrees to monitor patient closely and get her the help if she needs it, denies past history of suicide attempts, denies family history of suicide, denies access to firearms, denies substance abuse problems. Acute risk for suicide is low.  Plan Bipolar disorder-depressed-unstable Increase lamotrigine to 75 mg p.o. daily in divided dosage Increase Lexapro to 15 mg p.o. daily Start Klonopin 0.5 mg p.o. daily as needed for severe anxiety attacks only.  Advised patient to limit use.  Provided education about adverse side effects of benzodiazepine therapy. Continue Lunesta 3 mg p.o. nightly for sleep.  Will not make any changes today with that. Continue hydroxyzine 50 mg p.o. twice daily as needed for anxiety   GAD-unstable I have attempted to reach out to Ms. Miguel Dibble.  Left a message to communicate and coordinate care. Advised patient to get in touch with Ms. Miguel Dibble for sooner therapy session. Lexapro increased to 15 mg p.o. daily Add Klonopin 0.25 to 0.5 mg as needed for severe anxiety attacks only I have reviewed [place] controlled substance database  Insomnia-some progress Patient does report last night she could not sleep however otherwise Lunesta was beneficial.  Her sleep problems are also due to pain and fibromyalgia flareup.  She will reach out to her provider who is managing the same. She is on gabapentin for the fibromyalgia which she will continue. Continue Lunesta 3 mg p.o. nightly as needed for now.  Collateral information obtained from husband as noted above.  Follow-up in clinic in 3 weeks or sooner if needed.  I have spent atleast 30 minutes face to face by video with patient today.  More than 50 % of the time was spent for preparing to see the patient ( e.g., review of test, records ),ordering medications and test ,psychoeducation and supportive psychotherapy and care coordination,as well as documenting clinical  information in electronic health record. This note was generated in part or whole with voice recognition software. Voice recognition is usually quite accurate but there are transcription errors that can and very often do occur. I apologize for any typographical errors that were not detected and corrected.       Ursula Alert, MD 04/16/2020, 12:26 PM

## 2020-04-16 NOTE — Patient Instructions (Addendum)
  Crisis Hotline [facility] Numbers  Crisis [id number]  National Suicide Hotline - 1800-273-TALK or 1-800-SUICIDE  Crisis Text Line - Text HOME to ([phone]  Crisis chat - TMBPJPET KKOE   [phone]

## 2020-04-28 ENCOUNTER — Other Ambulatory Visit: Payer: Self-pay

## 2020-04-28 ENCOUNTER — Encounter: Payer: Self-pay | Admitting: Psychiatry

## 2020-04-28 ENCOUNTER — Telehealth (INDEPENDENT_AMBULATORY_CARE_PROVIDER_SITE_OTHER): Payer: Medicare PPO | Admitting: Psychiatry

## 2020-04-28 DIAGNOSIS — F3130 Bipolar disorder, current episode depressed, mild or moderate severity, unspecified: Secondary | ICD-10-CM

## 2020-04-28 DIAGNOSIS — F411 Generalized anxiety disorder: Secondary | ICD-10-CM | POA: Diagnosis not present

## 2020-04-28 MED ORDER — LAMOTRIGINE 100 MG PO TABS: 50.0000 mg | ORAL_TABLET | Freq: Two times a day (BID) | ORAL | 0 refills | Status: DC

## 2020-04-28 NOTE — Progress Notes (Signed)
Provider Location : [facility] Patient Location : Home  Participants: Patient , Provider  Virtual Visit via Video Note  I connected with Kaylee Gordon on 04/28/20 at 10:45 AM EDT by a video enabled telemedicine application and verified that I am speaking with the correct person using two identifiers.   I discussed the limitations of evaluation and management by telemedicine and the availability of in person appointments. The patient expressed understanding and agreed to proceed.  I discussed the assessment and treatment plan with the patient. The patient was provided an opportunity to ask questions and all were answered. The patient agreed with the plan and demonstrated an understanding of the instructions.   The patient was advised to call back or seek an in-person evaluation if the symptoms worsen or if the condition fails to improve as anticipated.   [place] MD OP Progress Note  04/28/2020 10:40 AM Kaylee Gordon  MRN:  [id number]  Chief Complaint:  Chief Complaint    Follow-up     HPI: Kaylee Gordon is a 59 year old Caucasian female, has a history of bipolar disorder, GAD, fibromyalgia, tremors, recent dizzy spells, is married, lives in [place], was evaluated by telemedicine today.  Patient today reports she is currently feeling better than her last visit.  She reports her mood symptoms as improving.  She however continues to struggle with low energy, lack of motivation.   She reports sleep is improved on the Lunesta.  She is compliant on her medications as prescribed.  Denies side effects.  She reports she was able to take a trip to the beach with her family which she enjoyed.  She currently denies any suicidality, homicidality or perceptual disturbances.  Patient reports she is currently struggling with upper respiratory tract infection and has a lot of congestion and fatigue from the same.  She reports the first 2 days she could not get out of bed.  She has not reached  out to her primary care provider yet but reports she will be able to do that.  She reports she had a good therapy visit with her therapist last week.  It went well.  She reports the next visit she is supposed to take her husband with her and her husband is agreeable.  Patient denies any other concerns today.    Visit Diagnosis:    ICD-10-CM   1. Bipolar I disorder, most recent episode depressed (HCC)  F31.30 lamoTRIgine (LAMICTAL) 100 MG tablet  2. GAD (generalized anxiety disorder)  F41.1     Past Psychiatric History: I have reviewed past psychiatric history from my progress note on 09/11/2018  Past Medical History:  Past Medical History:  Diagnosis Date  . Anxiety   . Asthma   . B12 deficiency   . Cancer [facility])    SKIN CANCER  . CHF (congestive heart failure) ([place])   . Depression   . Hypertension   . Thyroid disease     Past Surgical History:  Procedure Laterality Date  . ANKLE SURGERY Right 2012  . APPENDECTOMY    . AUGMENTATION MAMMAPLASTY Bilateral   . BREAST ENHANCEMENT SURGERY    . COLONOSCOPY WITH PROPOFOL N/A 02/22/2015   Procedure: COLONOSCOPY WITH PROPOFOL;  Surgeon: Josefine Class, MD;  Location: [facility] ENDOSCOPY;  Service: Endoscopy;  Laterality: N/A;  . ESOPHAGOGASTRODUODENOSCOPY (EGD) WITH PROPOFOL N/A 02/22/2015   Procedure: ESOPHAGOGASTRODUODENOSCOPY (EGD) WITH PROPOFOL;  Surgeon: Josefine Class, MD;  Location: [facility] ENDOSCOPY;  Service: Endoscopy;  Laterality: N/A;  . ESOPHAGOGASTRODUODENOSCOPY (EGD)  WITH PROPOFOL N/A 02/11/2016   Procedure: ESOPHAGOGASTRODUODENOSCOPY (EGD) WITH PROPOFOL;  Surgeon: Manya Silvas, MD;  Location: [facility] ENDOSCOPY;  Service: Endoscopy;  Laterality: N/A;  . HEMORRHOID SURGERY    . NASAL SINUS SURGERY    . SAVORY DILATION N/A 02/22/2015   Procedure: SAVORY DILATION;  Surgeon: Josefine Class, MD;  Location: [facility] ENDOSCOPY;  Service: Endoscopy;  Laterality: N/A;  . SAVORY DILATION N/A 02/11/2016   Procedure: SAVORY  DILATION;  Surgeon: Manya Silvas, MD;  Location: [facility] ENDOSCOPY;  Service: Endoscopy;  Laterality: N/A;  . SKIN CANCER EXCISION      Family Psychiatric History: I have reviewed family psychiatric history from my progress note on 09/11/2018  Family History:  Family History  Problem Relation Age of Onset  . Cancer Mother        colon cancer  . Depression Mother   . Anxiety disorder Mother   . Heart disease Father   . Cancer Father        ? neck cancer- still living.   . Anxiety disorder Father   . Depression Father   . Hypoparathyroidism Son   . Colon cancer Maternal Aunt        died in 48s.     Social History: I have reviewed social history from my progress note on 09/11/2018 Social History   Socioeconomic History  . Marital status: Married    Spouse name: Legrand Como  . Number of children: Not on file  . Years of education: Not on file  . Highest education level: Not on file  Occupational History  . Not on file  Tobacco Use  . Smoking status: Never Smoker  . Smokeless tobacco: Never Used  Vaping Use  . Vaping Use: Never assessed  Substance and Sexual Activity  . Alcohol use: No    Alcohol/week: 0.0 standard drinks    Comment: MAYBE ONCE OR TWICE A MONTH  . Drug use: No  . Sexual activity: Not Currently    Partners: Male  Other Topics Concern  . Not on file  Social History Narrative   Lives in [place]; taught high school- math; retd; no smoking; social alcohol.    Social Determinants of Health   Financial Resource Strain:   . Difficulty of Paying Living Expenses: Not on file  Food Insecurity:   . Worried About Charity fundraiser in the Last Year: Not on file  . Ran Out of Food in the Last Year: Not on file  Transportation Needs:   . Lack of Transportation (Medical): Not on file  . Lack of Transportation (Non-Medical): Not on file  Physical Activity:   . Days of Exercise per Week: Not on file  . Minutes of Exercise per Session: Not on file  Stress:    . Feeling of Stress : Not on file  Social Connections:   . Frequency of Communication with Friends and Family: Not on file  . Frequency of Social Gatherings with Friends and Family: Not on file  . Attends Religious Services: Not on file  . Active Member of Clubs or Organizations: Not on file  . Attends Archivist Meetings: Not on file  . Marital Status: Not on file    Allergies:  Allergies  Allergen Reactions  . Atorvastatin   . Hydrochlorothiazide Other (See Comments)    Intolerance - dry lips  . Parafon Forte Dsc [Chlorzoxazone]   . Pollen Extract   . Sudafed [Pseudoephedrine Hcl]     Heart racing  Metabolic Disorder Labs: No results found for: HGBA1C, MPG Lab Results  Component Value Date   PROLACTIN 18.3 07/22/2014   No results found for: CHOL, TRIG, HDL, CHOLHDL, VLDL, LDLCALC Lab Results  Component Value Date   TSH 1.14 04/05/2020   TSH 3.95 12/17/2019    Therapeutic Level Labs: No results found for: LITHIUM Lab Results  Component Value Date   VALPROATE 39 (L) 02/04/2019   No components found for:  CBMZ  Current Medications: Current Outpatient Medications  Medication Sig Dispense Refill  . azelastine (ASTELIN) 0.1 % nasal spray Place into the nose.    . calcitRIOL (ROCALTROL) 0.25 MCG capsule TAKE ONE CAPSULE BY MOUTH DAILY 30 capsule 1  . clonazePAM (KLONOPIN) 0.5 MG tablet Take 0.5-1 tablets (0.25-0.5 mg total) by mouth as directed. Take half to 1 tablet daily up to 2-3 times a week only for severe anxiety 12 tablet 1  . escitalopram (LEXAPRO) 10 MG tablet Take 1.5 tablets (15 mg total) by mouth daily. 45 tablet 1  . Eszopiclone 3 MG TABS Take 1 tablet (3 mg total) by mouth at bedtime. Take immediately before bedtime 30 tablet 1  . fluticasone (FLONASE) 50 MCG/ACT nasal spray     . gabapentin (NEURONTIN) 300 MG capsule Take 300 mg by mouth. 2 CAP AM, 1 CAP Q noon, 2 CAP pm,    . hydrOXYzine (VISTARIL) 50 MG capsule Take 1 capsule (50 mg  total) by mouth 2 (two) times daily as needed for anxiety. 60 capsule 1  . lamoTRIgine (LAMICTAL) 100 MG tablet Take 0.5 tablets (50 mg total) by mouth 2 (two) times daily. 90 tablet 0  . levothyroxine (SYNTHROID) 75 MCG tablet Take 1 tablet (75 mcg total) by mouth daily before breakfast. 90 tablet 1  . lisinopril (ZESTRIL) 10 MG tablet Take 10 mg by mouth daily.    Marland Kitchen lovastatin (MEVACOR) 20 MG tablet Take 20 mg by mouth at bedtime.    . ondansetron (ZOFRAN-ODT) 4 MG disintegrating tablet     . pantoprazole (PROTONIX) 40 MG tablet Take 1 tablet (40 mg total) by mouth daily. 30 tablet 1  . potassium chloride SA (KLOR-CON M20) 20 MEQ tablet Take 1 tablet (20 mEq total) by mouth 2 (two) times daily. 30 tablet 0  . propranolol (INNOPRAN XL) 80 MG 24 hr capsule Take by mouth.    . propranolol ER (INDERAL LA) 80 MG 24 hr capsule     . vitamin B-12 (CYANOCOBALAMIN) 1000 MCG tablet Take by mouth.      No current facility-administered medications for this visit.     Musculoskeletal: Strength & Muscle Tone: UTA Gait & Station: normal Patient leans: N/A  Psychiatric Specialty Exam: Review of Systems  Constitutional: Positive for fatigue.  HENT: Positive for congestion and sinus pressure.   Psychiatric/Behavioral: Positive for dysphoric mood.  All other systems reviewed and are negative.   There were no vitals taken for this visit.There is no height or weight on file to calculate BMI.  General Appearance: Casual  Eye Contact:  Fair  Speech:  Clear and Coherent  Volume:  Normal  Mood:  Depressed improving  Affect:  Congruent  Thought Process:  Goal Directed and Descriptions of Associations: Intact  Orientation:  Full (Time, Place, and Person)  Thought Content: Logical   Suicidal Thoughts:  No  Homicidal Thoughts:  No  Memory:  Immediate;   Fair Recent;   Fair Remote;   Fair  Judgement:  Fair  Insight:  Fair  Psychomotor  Activity:  Normal  Concentration:  Concentration: Fair and  Attention Span: Fair  Recall:  [employer] of Knowledge: Fair  Language: Fair  Akathisia:  No  Handed:  Right  AIMS (if indicated): UTA  Assets:  Communication Skills Desire for Improvement Housing Social Support  ADL's:  Intact  Cognition: WNL  Sleep:  Fair   Screenings:   Assessment and Plan: KEARSTIN LEARN is a 59 year old Caucasian female, married, lives in [place], single, has a history of bipolar disorder, anxiety disorder, fibromyalgia, IBS was evaluated by telemedicine today.  Patient is currently struggling making progress with regards to her depressive symptoms.  She does have psychosocial stressors of her own health issues, relationship struggles, her son's health issues.  Patient however will continue to benefit from medication management and psychotherapy sessions.  Plan Bipolar disorder-improving Increase Lamictal to 100 mg p.o. daily divided dosage. Lexapro 15 mg p.o. daily Klonopin 0.5 mg p.o. daily as needed for severe anxiety attacks only.  Patient to limit use.  She is aware about the long-term risk of benzodiazepine therapy. Lunesta 3 mg p.o. nightly for sleep. Hydroxyzine 50 mg p.o. twice daily as needed for anxiety.  GAD-improving  Patient to continue CBT.  She follows up with Ms. Miguel Dibble. Lexapro 15 mg p.o. daily Klonopin 0.25 to 0.5 mg p.o. daily as needed for severe anxiety attacks. I have reviewed [place] controlled substance database.  Insomnia-stable Gabapentin for fibromyalgia. Lunesta 3 mg p.o. nightly as needed.   Follow-up in clinic in 3 to 4 weeks or sooner if needed.  I have spent atleast 20 minutes face to face by video with patient today. More than 50 % of the time was spent for preparing to see the patient ( e.g., review of test, records ), ordering medications and test ,psychoeducation and supportive psychotherapy and care coordination,as well as documenting clinical information in electronic health record. This note was generated  in part or whole with voice recognition software. Voice recognition is usually quite accurate but there are transcription errors that can and very often do occur. I apologize for any typographical errors that were not detected and corrected.       Ursula Alert, MD 04/28/2020, 10:40 AM

## 2020-05-09 ENCOUNTER — Other Ambulatory Visit: Payer: Self-pay | Admitting: Endocrinology

## 2020-05-11 ENCOUNTER — Other Ambulatory Visit: Payer: Self-pay | Admitting: Psychiatry

## 2020-05-14 ENCOUNTER — Other Ambulatory Visit: Payer: Self-pay | Admitting: Psychiatry

## 2020-05-14 DIAGNOSIS — F411 Generalized anxiety disorder: Secondary | ICD-10-CM

## 2020-05-27 ENCOUNTER — Telehealth: Payer: Medicare PPO | Admitting: Psychiatry

## 2020-05-27 ENCOUNTER — Other Ambulatory Visit: Payer: Self-pay

## 2020-05-31 ENCOUNTER — Telehealth: Payer: Self-pay

## 2020-05-31 DIAGNOSIS — G4700 Insomnia, unspecified: Secondary | ICD-10-CM

## 2020-05-31 MED ORDER — ESZOPICLONE 3 MG PO TABS: 3.0000 mg | ORAL_TABLET | Freq: Every day | ORAL | 1 refills | Status: DC

## 2020-05-31 NOTE — Telephone Encounter (Signed)
pt called states she needs a refill on her medication eszopiclone 3mg . pt states she is out.     Eszopiclone 3 MG TABS Medication Date: 03/30/2020 Department: [facility] Ordering/Authorizing: Ursula Alert, MD  Order Providers  Prescribing Provider Encounter Provider  Ursula Alert, MD Debbrah Alar, RN  Outpatient Medication Detail   Disp Refills Start End   Eszopiclone 3 MG TABS 30 tablet 1 03/30/2020    Sig - Route: Take 1 tablet (3 mg total) by mouth at bedtime. Take immediately before bedtime - Oral   Sent to pharmacy as: Eszopiclone 3 MG Tab   E-Prescribing Status: Receipt confirmed by pharmacy (03/30/2020 5:19 PM EDT)   Associated Diagnoses  Insomnia, unspecified type     Pharmacy  [place], [place]

## 2020-05-31 NOTE — Telephone Encounter (Signed)
I have sent Lunesta to pharmacy.

## 2020-06-02 ENCOUNTER — Telehealth: Payer: Self-pay

## 2020-06-02 DIAGNOSIS — G4700 Insomnia, unspecified: Secondary | ICD-10-CM

## 2020-06-02 NOTE — Telephone Encounter (Signed)
Pt called states pharmacy did not received refill   Eszopiclone 3 MG TABS Medication Date: 05/31/2020 Department: [facility] Ordering/Authorizing: Ursula Alert, MD  Order Providers  Prescribing Provider Encounter Provider  Ursula Alert, MD Carlynn Purl, CMA  Outpatient Medication Detail   Disp Refills Start End   Eszopiclone 3 MG TABS 30 tablet 1 05/31/2020    Sig - Route: Take 1 tablet (3 mg total) by mouth at bedtime. Take immediately before bedtime - Oral   Sent to pharmacy as: Eszopiclone 3 MG Tab   E-Prescribing Status: Transmission to pharmacy failed (05/31/2020 5:26 PM EST)   Associated Diagnoses  Insomnia, unspecified type     Pharmacy  [place], [place]

## 2020-06-03 MED ORDER — ESZOPICLONE 3 MG PO TABS: 3.0000 mg | ORAL_TABLET | Freq: Every day | ORAL | 1 refills | Status: DC

## 2020-06-03 NOTE — Telephone Encounter (Signed)
I have sent Lunesta again to pharmacy.

## 2020-06-17 ENCOUNTER — Other Ambulatory Visit: Payer: Self-pay | Admitting: Psychiatry

## 2020-06-25 ENCOUNTER — Other Ambulatory Visit: Payer: Self-pay

## 2020-06-25 ENCOUNTER — Telehealth (INDEPENDENT_AMBULATORY_CARE_PROVIDER_SITE_OTHER): Payer: Medicare PPO | Admitting: Psychiatry

## 2020-06-25 ENCOUNTER — Encounter: Payer: Self-pay | Admitting: Psychiatry

## 2020-06-25 DIAGNOSIS — Z9189 Other specified personal risk factors, not elsewhere classified: Secondary | ICD-10-CM | POA: Insufficient documentation

## 2020-06-25 DIAGNOSIS — F3130 Bipolar disorder, current episode depressed, mild or moderate severity, unspecified: Secondary | ICD-10-CM | POA: Diagnosis not present

## 2020-06-25 DIAGNOSIS — G4701 Insomnia due to medical condition: Secondary | ICD-10-CM | POA: Diagnosis not present

## 2020-06-25 DIAGNOSIS — F411 Generalized anxiety disorder: Secondary | ICD-10-CM | POA: Diagnosis not present

## 2020-06-25 MED ORDER — ESCITALOPRAM OXALATE 10 MG PO TABS: 15.0000 mg | ORAL_TABLET | Freq: Every day | ORAL | 1 refills | Status: DC

## 2020-06-25 MED ORDER — LURASIDONE HCL 20 MG PO TABS: 20.0000 mg | ORAL_TABLET | Freq: Every day | ORAL | 0 refills | Status: DC

## 2020-06-25 MED ORDER — HYDROXYZINE PAMOATE 50 MG PO CAPS: 50.0000 mg | ORAL_CAPSULE | Freq: Two times a day (BID) | ORAL | 1 refills | Status: DC | PRN

## 2020-06-25 NOTE — Progress Notes (Addendum)
Virtual Visit via Video Note  I connected with Kaylee Gordon on 06/25/20 at 11:00 AM EST by a video enabled telemedicine application and verified that I am speaking with the correct person using two identifiers.  Location Provider Location : [facility] Patient Location : Home  Participants: Patient , Provider    I discussed the limitations of evaluation and management by telemedicine and the availability of in person appointments. The patient expressed understanding and agreed to proceed.   I discussed the assessment and treatment plan with the patient. The patient was provided an opportunity to ask questions and all were answered. The patient agreed with the plan and demonstrated an understanding of the instructions.   The patient was advised to call back or seek an in-person evaluation if the symptoms worsen or if the condition fails to improve as anticipated.   [place] MD OP Progress Note  06/25/2020 12:48 PM Ivionna DANIQUE HARTSOUGH  MRN:  [id number]  Chief Complaint:  Chief Complaint    Follow-up     HPI: Kaylee Gordon is a 59 year old Caucasian female who has a history of bipolar disorder, GAD, fibromyalgia, tremors, recent dizzy spells, is married, lives in [place], was evaluated by telemedicine today. patient today reports she is currently struggling with depressive symptoms.  She has good days and bad days.  She continues to have episodes of sadness, lack of motivation, low energy.  She reports she often feels as though she is tired of being here.  She however currently denies any active suicidality or plan at this time.  She reports she is compliant on medications.  She however does not know if these medications are helpful.  She struggles with anxiety also on a regular basis.  She reports she worries about things in general.  She has been biting her nails a lot recently.  She also struggles with social anxiety.  She reports she had to go to a get together at her previous workplace  and that did not go too well.  The whole thing made her sad.  She uses hydroxyzine which helps to some extent with her anxiety.  She has not used the Klonopin much.  She has 1 refill left on it.  She reports it helps when she takes it.  She continues to have relationship struggles with her husband.  She reports her husband joined her for a counseling session recently with Ms. Miguel Dibble.  Her therapist recommended she and her husband try marriage counseling.  She however reports her husband is not interested.  Patient reports she continues to struggle with fibromyalgia which also contributes to her current mood symptoms.  She also reports her husband being at home all day since he works remotely also has an impact on the way she feels.  She is sleeping well on the Lunesta.  She denies any other concerns today.   Visit Diagnosis:    ICD-10-CM   1. Bipolar I disorder, most recent episode depressed (HCC)  F31.30 lurasidone (LATUDA) 20 MG TABS tablet   moderate  2. GAD (generalized anxiety disorder)  F41.1 escitalopram (LEXAPRO) 10 MG tablet  3. 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 At risk for long QT syndrome  Z91.89 EKG 12-Lead    Past Psychiatric History: I have reviewed past psychiatric history from my progress note on 09/11/2018  Past Medical History:  Past Medical History:  Diagnosis Date  . Anxiety   . Asthma   . B12  deficiency   . Cancer [facility])    SKIN CANCER  . CHF (congestive heart failure) ([place])   . Depression   . Hypertension   . Thyroid disease     Past Surgical History:  Procedure Laterality Date  . ANKLE SURGERY Right 2012  . APPENDECTOMY    . AUGMENTATION MAMMAPLASTY Bilateral   . BREAST ENHANCEMENT SURGERY    . COLONOSCOPY WITH PROPOFOL N/A 02/22/2015   Procedure: COLONOSCOPY WITH PROPOFOL;  Surgeon: Josefine Class, MD;  Location: [facility] ENDOSCOPY;  Service: Endoscopy;  Laterality: N/A;  .  ESOPHAGOGASTRODUODENOSCOPY (EGD) WITH PROPOFOL N/A 02/22/2015   Procedure: ESOPHAGOGASTRODUODENOSCOPY (EGD) WITH PROPOFOL;  Surgeon: Josefine Class, MD;  Location: [facility] ENDOSCOPY;  Service: Endoscopy;  Laterality: N/A;  . ESOPHAGOGASTRODUODENOSCOPY (EGD) WITH PROPOFOL N/A 02/11/2016   Procedure: ESOPHAGOGASTRODUODENOSCOPY (EGD) WITH PROPOFOL;  Surgeon: Manya Silvas, MD;  Location: [facility] ENDOSCOPY;  Service: Endoscopy;  Laterality: N/A;  . HEMORRHOID SURGERY    . NASAL SINUS SURGERY    . SAVORY DILATION N/A 02/22/2015   Procedure: SAVORY DILATION;  Surgeon: Josefine Class, MD;  Location: [facility] ENDOSCOPY;  Service: Endoscopy;  Laterality: N/A;  . SAVORY DILATION N/A 02/11/2016   Procedure: SAVORY DILATION;  Surgeon: Manya Silvas, MD;  Location: [facility] ENDOSCOPY;  Service: Endoscopy;  Laterality: N/A;  . SKIN CANCER EXCISION      Family Psychiatric History: Reviewed family psychiatric history from my progress note on 09/11/2018  Family History:  Family History  Problem Relation Age of Onset  . Cancer Mother        colon cancer  . Depression Mother   . Anxiety disorder Mother   . Heart disease Father   . Cancer Father        ? neck cancer- still living.   . Anxiety disorder Father   . Depression Father   . Hypoparathyroidism Son   . Colon cancer Maternal Aunt        died in 48s.     Social History: Reviewed social history from my progress note on 09/11/2018 Social History   Socioeconomic History  . Marital status: Married    Spouse name: Legrand Como  . Number of children: Not on file  . Years of education: Not on file  . Highest education level: Not on file  Occupational History  . Not on file  Tobacco Use  . Smoking status: Never Smoker  . Smokeless tobacco: Never Used  Vaping Use  . Vaping Use: Not on file  Substance and Sexual Activity  . Alcohol use: No    Alcohol/week: 0.0 standard drinks    Comment: MAYBE ONCE OR TWICE A MONTH  . Drug use: No  . Sexual  activity: Not Currently    Partners: Male  Other Topics Concern  . Not on file  Social History Narrative   Lives in [place]; taught high school- math; retd; no smoking; social alcohol.    Social Determinants of Health   Financial Resource Strain: Not on file  Food Insecurity: Not on file  Transportation Needs: Not on file  Physical Activity: Not on file  Stress: Not on file  Social Connections: Not on file    Allergies:  Allergies  Allergen Reactions  . Atorvastatin   . Hydrochlorothiazide Other (See Comments)    Intolerance - dry lips  . Parafon Forte Dsc [Chlorzoxazone]   . Pollen Extract   . Sudafed [Pseudoephedrine Hcl]     Heart racing    Metabolic Disorder Labs: No results  found for: HGBA1C, MPG Lab Results  Component Value Date   PROLACTIN 18.3 07/22/2014   No results found for: CHOL, TRIG, HDL, CHOLHDL, VLDL, LDLCALC Lab Results  Component Value Date   TSH 1.14 04/05/2020   TSH 3.95 12/17/2019    Therapeutic Level Labs: No results found for: LITHIUM Lab Results  Component Value Date   VALPROATE 39 (L) 02/04/2019   No components found for:  CBMZ  Current Medications: Current Outpatient Medications  Medication Sig Dispense Refill  . azelastine (ASTELIN) 0.1 % nasal spray Place into the nose.    . calcitRIOL (ROCALTROL) 0.25 MCG capsule TAKE ONE CAPSULE BY MOUTH DAILY 90 capsule 1  . clonazePAM (KLONOPIN) 0.5 MG tablet Take 0.5-1 tablets (0.25-0.5 mg total) by mouth as directed. Take half to 1 tablet daily up to 2-3 times a week only for severe anxiety 12 tablet 1  . escitalopram (LEXAPRO) 10 MG tablet Take 1.5 tablets (15 mg total) by mouth daily. 45 tablet 1  . Eszopiclone 3 MG TABS Take 1 tablet (3 mg total) by mouth at bedtime. Take immediately before bedtime 30 tablet 1  . fluticasone (FLONASE) 50 MCG/ACT nasal spray     . gabapentin (NEURONTIN) 300 MG capsule Take 300 mg by mouth. 2 CAP AM, 1 CAP Q noon, 2 CAP pm,    . hydrOXYzine (VISTARIL)  50 MG capsule Take 1 capsule (50 mg total) by mouth 2 (two) times daily as needed for anxiety. 60 capsule 1  . lamoTRIgine (LAMICTAL) 100 MG tablet Take 0.5 tablets (50 mg total) by mouth 2 (two) times daily. 90 tablet 0  . levothyroxine (SYNTHROID) 75 MCG tablet Take 1 tablet (75 mcg total) by mouth daily before breakfast. 90 tablet 1  . lisinopril (ZESTRIL) 10 MG tablet Take 10 mg by mouth daily.    Marland Kitchen lovastatin (MEVACOR) 20 MG tablet Take 20 mg by mouth at bedtime.    Marland Kitchen lurasidone (LATUDA) 20 MG TABS tablet Take 1 tablet (20 mg total) by mouth daily with supper. 30 tablet 0  . ondansetron (ZOFRAN-ODT) 4 MG disintegrating tablet     . pantoprazole (PROTONIX) 40 MG tablet Take 1 tablet (40 mg total) by mouth daily. 30 tablet 1  . potassium chloride SA (KLOR-CON M20) 20 MEQ tablet Take 1 tablet (20 mEq total) by mouth 2 (two) times daily. 30 tablet 0  . propranolol (INNOPRAN XL) 80 MG 24 hr capsule Take by mouth.    . propranolol ER (INDERAL LA) 80 MG 24 hr capsule     . vitamin B-12 (CYANOCOBALAMIN) 1000 MCG tablet Take by mouth.      No current facility-administered medications for this visit.     Musculoskeletal: Strength & Muscle Tone: UTA Gait & Station: normal Patient leans: N/A  Psychiatric Specialty Exam: Review of Systems  Constitutional: Positive for fatigue.  Musculoskeletal: Positive for myalgias.  Psychiatric/Behavioral: Positive for dysphoric mood. The patient is nervous/anxious.   All other systems reviewed and are negative.   There were no vitals taken for this visit.There is no height or weight on file to calculate BMI.  General Appearance: Casual  Eye Contact:  Fair  Speech:  Clear and Coherent  Volume:  Normal  Mood:  Anxious and Depressed  Affect:  Congruent  Thought Process:  Goal Directed and Descriptions of Associations: Intact  Orientation:  Full (Time, Place, and Person)  Thought Content: Logical   Suicidal Thoughts:  No  Homicidal Thoughts:  No   Memory:  Immediate;  Fair Recent;   Fair Remote;   Fair  Judgement:  Fair  Insight:  Fair  Psychomotor Activity:  Normal  Concentration:  Concentration: Fair and Attention Span: Fair  Recall:  [employer] of Knowledge: Fair  Language: Fair  Akathisia:  No  Handed:  Right  AIMS (if indicated): UTA  Assets:  Communication Skills Desire for Improvement Housing Social Support  ADL's:  Intact  Cognition: WNL  Sleep:  Fair   Screenings:   Assessment and Plan: SLYVIA LARTIGUE is a 59 year old Caucasian female, married, lives in [place], has a history of bipolar disorder, anxiety disorder, fibromyalgia, IBS was evaluated by telemedicine today.  Patient is currently struggling with depressive symptoms as well as anxiety symptoms.  She does have multiple psychosocial stressors and also has fibromyalgia which could be contributing to her mood symptoms.  She will continue to benefit from medication readjustment and psychotherapy sessions.   Plan Bipolar disorder-unstable Start Latuda 20 mg p.o. daily with supper. Lamictal 100 mg p.o. daily in divided dosage Lexapro 15 mg p.o. daily Klonopin 0.5 mg p.o. daily as needed for severe anxiety attacks only.  Patient is limiting use. Lunesta 3 mg p.o. nightly for sleep Hydroxyzine 50 mg p.o. twice daily as needed for anxiety  GAD-unstable Continue CBT with Ms. Miguel Dibble. Lexapro 15 mg p.o. daily Klonopin 0.25-0.5 mg p.o. daily as needed for severe anxiety attacks-encouraged patient to take Klonopin as needed which has been helpful. I have reviewed  controlled substance database.  Insomnia-stable Gabapentin as prescribed for fibromyalgia. Lunesta 3 mg p.o. nightly as needed  Provided supportive psychotherapy.  Discussed picking up a hobby, doing volunteer activity outside of home so that patient is not at home all day.  Discussed treatment options like referral for ECT, for treatment resistant depression.  Will order EKG to  monitor prolonged QT syndrome since she is at risk.  Follow-up in clinic in 2 weeks or sooner if needed.  I have spent atleast 30 minutes face to face by video with patient today. More than 50 % of the time was spent for preparing to see the patient ( e.g., review of test, records ), ordering medications and test ,psychoeducation and supportive psychotherapy and care coordination,as well as documenting clinical information in electronic health record. This note was generated in part or whole with voice recognition software. Voice recognition is usually quite accurate but there are transcription errors that can and very often do occur. I apologize for any typographical errors that were not detected and corrected.          Ursula Alert, MD 06/25/2020, 12:48 PM

## 2020-06-25 NOTE — Patient Instructions (Signed)
Lurasidone oral tablet What is this medicine? LURASIDONE (loo RAS i done) is an antipsychotic. It is used to treat schizophrenia or bipolar disorder, also known as manic-depression. This medicine may be used for other purposes; ask your health care provider or pharmacist if you have questions. COMMON BRAND NAME(S): Latuda What should I tell my health care provider before I take this medicine? They need to know if you have any of these conditions:  dementia  diabetes  difficulty swallowing  heart disease  history of breast cancer  kidney disease  liver disease  low blood counts, like low white cell, platelet, or red cell counts  low blood pressure  Parkinson's disease  seizures  suicidal thoughts, plans, or attempt; a previous suicide attempt by you or a family member  an unusual or allergic reaction to lurasidone, other medicines, foods, dyes, or preservatives  pregnant or trying to get pregnant  breast-feeding How should I use this medicine? Take this medicine by mouth with a glass of water. Follow the directions on the prescription label. Take this medicine with food. Take your medicine at regular intervals. Do not take it more often than directed. Do not stop taking except on your doctor's advice. A special MedGuide will be given to you by the pharmacist with each prescription and refill. Be sure to read this information carefully each time. Talk to your pediatrician regarding the use of this medicine in children. While this drug may be prescribed for children as young as 10 years for selected conditions, precautions do apply. Overdosage: If you think you have taken too much of this medicine contact a poison control center or emergency room at once. NOTE: This medicine is only for you. Do not share this medicine with others. What if I miss a dose? If you miss a dose, take it as soon as you can. If it is almost time for your next dose, take only that dose. Do not take  double or extra doses. What may interact with this medicine? Do not take this medicine with any of the following medications:  avasimibe  certain medicines for fungal infections like ketoconazole, voriconazole  certain medicines for seizures like carbamazepine, phenytoin  clarithromycin  mibefradil  rifampin  ritonavir  St. John's Wort This medicine may also interact with the following medications:  atazanavir  bosentan  certain medicines for anxiety or sleep  diltiazem  efavirenz  erythromycin  etravirine  fluconazole  grapefruit juice  medicines for blood pressure  modafinil  nafcillin  verapamil This list may not describe all possible interactions. Give your health care provider a list of all the medicines, herbs, non-prescription drugs, or dietary supplements you use. Also tell them if you smoke, drink alcohol, or use illegal drugs. Some items may interact with your medicine. What should I watch for while using this medicine? Visit your health care professional for regular checks on your progress. Tell your health care professional if symptoms do not start to get better or if they get worse. Do not stop taking except on your health care professional's advice. You may develop a severe reaction. Your health care professional will tell you how much medicine to take. Patients and their families should watch out for new or worsening depression or thoughts of suicide. Also watch out for sudden changes in feelings such as feeling anxious, agitated, panicky, irritable, hostile, aggressive, impulsive, severely restless, overly excited and hyperactive, or not being able to sleep. If this happens, especially at the beginning of treatment or  after a change in dose, call your healthcare professional. This medicine may increase blood sugar. Ask your health care provider if changes in diet or medicines are needed if you have diabetes. You may get dizzy or drowsy. Do not drive,  use machinery, or do anything that needs mental alertness until you know how this medicine affects you. Do not stand or sit up quickly, especially if you are an older patient. This reduces the risk of dizzy or fainting spells. Alcohol may interfere with the effect of this medicine. Avoid alcoholic drinks. This medicine may cause dry eyes and blurred vision. If you wear contact lenses you may feel some discomfort. Lubricating drops may help. See your eye doctor if the problem does not go away or is severe. This drug can cause problems with controlling your body temperature. It can lower the response of your body to cold temperatures. If possible, stay indoors during cold weather. If you must go outdoors, wear warm clothes. It can also lower the response of your body to heat. Do not overheat. Do not over-exercise. Stay out of the sun when possible. If you must be in the sun, wear cool clothing. Drink plenty of water. If you have trouble controlling your body temperature, call your health care provider right away. What side effects may I notice from receiving this medicine? Side effects that you should report to your doctor or health care professional as soon as possible:  allergic reactions like skin rash, itching or hives, swelling of the face, lips, or tongue  breathing problems  confusion  elevated mood, decreased need for sleep, racing thoughts, impulsive behavior  fever or chills, sore throat  inability to keep still  males: prolonged or painful erection  problems with balance, talking, walking  seizures  signs and symptoms of high blood sugar such as being more thirsty or hungry or having to urinate more than normal. You may also feel very tired or have blurry vision  signs and symptoms of low blood pressure like dizziness; feeling faint or lightheaded, falls; unusually weak or tired  signs and symptoms of neuroleptic malignant syndrome like confusion; fast or irregular heartbeat;  high fever; increased sweating; stiff muscles  sudden numbness or weakness of the face, arm, or leg  suicidal thoughts or other mood changes  trouble swallowing  uncontrollable movements of the arms, face, head, mouth, neck, or upper body Side effects that usually do not require medical attention (report to your doctor or health care professional if they continue or are bothersome):  drowsiness  nausea  runny nose  tiredness  weight gain This list may not describe all possible side effects. Call your doctor for medical advice about side effects. You may report side effects to FDA at 1-800-FDA-1088. Where should I keep my medicine? Keep out of the reach of children. Store at room temperature between 15 and 30 degrees C (59 and 86 degrees F). Throw away any unused medicine after the expiration date. NOTE: This sheet is a summary. It may not cover all possible information. If you have questions about this medicine, talk to your doctor, pharmacist, or health care provider.  2020 Elsevier/Gold Standard (2019-04-29 16:27:35)

## 2020-06-26 ENCOUNTER — Other Ambulatory Visit: Payer: Self-pay | Admitting: Endocrinology

## 2020-06-28 ENCOUNTER — Telehealth: Payer: Self-pay

## 2020-06-28 NOTE — Telephone Encounter (Signed)
went online and submitted the prior auth - pending ?

## 2020-06-28 NOTE — Telephone Encounter (Signed)
received fax that the prior auth for latuda 20mg  was approved until 07-16-21

## 2020-06-28 NOTE — Telephone Encounter (Signed)
received a request that a prior auth was needed on the latuda 20mg 

## 2020-06-28 NOTE — Telephone Encounter (Signed)
EKG faxed and confirmed to [facility].

## 2020-07-01 ENCOUNTER — Other Ambulatory Visit (HOSPITAL_COMMUNITY): Payer: Medicare PPO

## 2020-07-05 ENCOUNTER — Other Ambulatory Visit: Payer: Self-pay

## 2020-07-05 ENCOUNTER — Ambulatory Visit (HOSPITAL_COMMUNITY)
Admission: RE | Admit: 2020-07-05 | Discharge: 2020-07-05 | Disposition: A | Discharge status: Home / Self Care | Payer: Medicare PPO | Source: Ambulatory Visit | Attending: Psychiatry | Admitting: Psychiatry

## 2020-07-05 DIAGNOSIS — I4581 Long QT syndrome: Secondary | ICD-10-CM | POA: Diagnosis present

## 2020-07-08 ENCOUNTER — Other Ambulatory Visit: Payer: Self-pay

## 2020-07-08 ENCOUNTER — Encounter: Payer: Self-pay | Admitting: Psychiatry

## 2020-07-08 ENCOUNTER — Telehealth (INDEPENDENT_AMBULATORY_CARE_PROVIDER_SITE_OTHER): Payer: Medicare PPO | Admitting: Psychiatry

## 2020-07-08 DIAGNOSIS — F3130 Bipolar disorder, current episode depressed, mild or moderate severity, unspecified: Secondary | ICD-10-CM

## 2020-07-08 DIAGNOSIS — G4701 Insomnia due to medical condition: Secondary | ICD-10-CM | POA: Diagnosis not present

## 2020-07-08 DIAGNOSIS — F411 Generalized anxiety disorder: Secondary | ICD-10-CM | POA: Diagnosis not present

## 2020-07-08 MED ORDER — LURASIDONE HCL 40 MG PO TABS: 40.0000 mg | ORAL_TABLET | Freq: Every day | ORAL | 1 refills | Status: DC

## 2020-07-08 NOTE — Progress Notes (Signed)
Virtual Visit via Video Note  I connected with Kaylee Gordon on 07/08/20 at 11:45 AM EST by a video enabled telemedicine application and verified that I am speaking with the correct person using two identifiers.  Location Provider Location : [facility] Patient Location : Home  Participants: Patient , Provider    I discussed the limitations of evaluation and management by telemedicine and the availability of in person appointments. The patient expressed understanding and agreed to proceed.  I discussed the assessment and treatment plan with the patient. The patient was provided an opportunity to ask questions and all were answered. The patient agreed with the plan and demonstrated an understanding of the instructions.   The patient was advised to call back or seek an in-person evaluation if the symptoms worsen or if the condition fails to improve as anticipated.   [facility] MD OP Progress Note  07/08/2020 6:16 PM Kaylee Gordon  MRN:  [id number]  Chief Complaint:  Chief Complaint    Follow-up     HPI: Kaylee Gordon is a 59 year old Caucasian female who has a history of bipolar disorder, GAD, fibromyalgia, tremors, is married, lives in [place], was evaluated by telemedicine today.  Patient today reports she has a lot of psychosocial stressors.  She reports she always had a bad relationship with her father.  She reports she did not talk to him for 6 months however recently she had to contact him since he was hospitalized.  She reports he was always mean to her and the contact with him recently was kind of hard on her emotionally.  She reports she hence started getting more and more depressed.  She reports that she had severe depressive symptoms last week including sleep issues.  She had a lot of racing thoughts at night.  This week she is feeling a little bit better .  She thinks the [place] is beneficial.  She denies side effects.  She continues to be in psychotherapy sessions and met with  her therapist recently.  She reports she is motivated to stay in therapy.  She denies any suicidality at this time, denies any perceptual disturbances or homicidality.  Patient does report recent thoughts about not wanting to be here however reports she did not dwell on those thoughts and she did not have any plan.  She currently struggles with fibromyalgia flareup which also contributes to her worsening mood symptoms.  Visit Diagnosis:    ICD-10-CM   1. Bipolar I disorder, most recent episode depressed (HCC)  F31.30 lurasidone (LATUDA) 40 MG TABS tablet  2. GAD (generalized anxiety disorder)  F41.1   3. Insomnia due to medical condition  G47.01     Past Psychiatric History: I have reviewed past psychiatric history from my progress note on 09/11/2018  Past Medical History:  Past Medical History:  Diagnosis Date  . Anxiety   . Asthma   . B12 deficiency   . Cancer [facility])    SKIN CANCER  . CHF (congestive heart failure) ([place])   . Depression   . Hypertension   . Thyroid disease     Past Surgical History:  Procedure Laterality Date  . ANKLE SURGERY Right 2012  . APPENDECTOMY    . AUGMENTATION MAMMAPLASTY Bilateral   . BREAST ENHANCEMENT SURGERY    . COLONOSCOPY WITH PROPOFOL N/A 02/22/2015   Procedure: COLONOSCOPY WITH PROPOFOL;  Surgeon: Josefine Class, MD;  Location: [facility] ENDOSCOPY;  Service: Endoscopy;  Laterality: N/A;  . ESOPHAGOGASTRODUODENOSCOPY (EGD) WITH PROPOFOL N/A  02/22/2015   Procedure: ESOPHAGOGASTRODUODENOSCOPY (EGD) WITH PROPOFOL;  Surgeon: Josefine Class, MD;  Location: [facility] ENDOSCOPY;  Service: Endoscopy;  Laterality: N/A;  . ESOPHAGOGASTRODUODENOSCOPY (EGD) WITH PROPOFOL N/A 02/11/2016   Procedure: ESOPHAGOGASTRODUODENOSCOPY (EGD) WITH PROPOFOL;  Surgeon: Manya Silvas, MD;  Location: [facility] ENDOSCOPY;  Service: Endoscopy;  Laterality: N/A;  . HEMORRHOID SURGERY    . NASAL SINUS SURGERY    . SAVORY DILATION N/A 02/22/2015   Procedure: SAVORY DILATION;   Surgeon: Josefine Class, MD;  Location: [facility] ENDOSCOPY;  Service: Endoscopy;  Laterality: N/A;  . SAVORY DILATION N/A 02/11/2016   Procedure: SAVORY DILATION;  Surgeon: Manya Silvas, MD;  Location: [facility] ENDOSCOPY;  Service: Endoscopy;  Laterality: N/A;  . SKIN CANCER EXCISION      Family Psychiatric History: I have reviewed family psychiatric history from my progress note on 09/11/2018  Family History:  Family History  Problem Relation Age of Onset  . Cancer Mother        colon cancer  . Depression Mother   . Anxiety disorder Mother   . Heart disease Father   . Cancer Father        ? neck cancer- still living.   . Anxiety disorder Father   . Depression Father   . Hypoparathyroidism Son   . Colon cancer Maternal Aunt        died in 37s.     Social History: I have reviewed social history from my progress note on 09/11/2018 Social History   Socioeconomic History  . Marital status: Married    Spouse name: Legrand Como  . Number of children: Not on file  . Years of education: Not on file  . Highest education level: Not on file  Occupational History  . Not on file  Tobacco Use  . Smoking status: Never Smoker  . Smokeless tobacco: Never Used  Vaping Use  . Vaping Use: Not on file  Substance and Sexual Activity  . Alcohol use: No    Alcohol/week: 0.0 standard drinks    Comment: MAYBE ONCE OR TWICE A MONTH  . Drug use: No  . Sexual activity: Not Currently    Partners: Male  Other Topics Concern  . Not on file  Social History Narrative   Lives in [place]; taught high school- math; retd; no smoking; social alcohol.    Social Determinants of Health   Financial Resource Strain: Not on file  Food Insecurity: Not on file  Transportation Needs: Not on file  Physical Activity: Not on file  Stress: Not on file  Social Connections: Not on file    Allergies:  Allergies  Allergen Reactions  . Atorvastatin   . Hydrochlorothiazide Other (See Comments)    Intolerance  - dry lips  . Parafon Forte Dsc [Chlorzoxazone]   . Pollen Extract   . Sudafed [Pseudoephedrine Hcl]     Heart racing    Metabolic Disorder Labs: No results found for: HGBA1C, MPG Lab Results  Component Value Date   PROLACTIN 18.3 07/22/2014   No results found for: CHOL, TRIG, HDL, CHOLHDL, VLDL, LDLCALC Lab Results  Component Value Date   TSH 1.14 04/05/2020   TSH 3.95 12/17/2019    Therapeutic Level Labs: No results found for: LITHIUM Lab Results  Component Value Date   VALPROATE 39 (L) 02/04/2019   No components found for:  CBMZ  Current Medications: Current Outpatient Medications  Medication Sig Dispense Refill  . azelastine (ASTELIN) 0.1 % nasal spray Place into the nose.    Marland Kitchen  calcitRIOL (ROCALTROL) 0.25 MCG capsule TAKE ONE CAPSULE BY MOUTH DAILY 90 capsule 1  . clonazePAM (KLONOPIN) 0.5 MG tablet Take 0.5-1 tablets (0.25-0.5 mg total) by mouth as directed. Take half to 1 tablet daily up to 2-3 times a week only for severe anxiety 12 tablet 1  . escitalopram (LEXAPRO) 10 MG tablet Take 1.5 tablets (15 mg total) by mouth daily. 45 tablet 1  . Eszopiclone 3 MG TABS Take 1 tablet (3 mg total) by mouth at bedtime. Take immediately before bedtime 30 tablet 1  . fluticasone (FLONASE) 50 MCG/ACT nasal spray     . gabapentin (NEURONTIN) 300 MG capsule Take 300 mg by mouth. 2 CAP AM, 1 CAP Q noon, 2 CAP pm,    . hydrOXYzine (VISTARIL) 50 MG capsule Take 1 capsule (50 mg total) by mouth 2 (two) times daily as needed for anxiety. 60 capsule 1  . lamoTRIgine (LAMICTAL) 100 MG tablet Take 0.5 tablets (50 mg total) by mouth 2 (two) times daily. 90 tablet 0  . levothyroxine (SYNTHROID) 75 MCG tablet TAKE ONE TABLET BY MOUTH DAILY BEFORE BREAKFAST 90 tablet 1  . lisinopril (ZESTRIL) 10 MG tablet Take 10 mg by mouth daily.    Marland Kitchen lovastatin (MEVACOR) 20 MG tablet Take 20 mg by mouth at bedtime.    Marland Kitchen lurasidone (LATUDA) 40 MG TABS tablet Take 1 tablet (40 mg total) by mouth daily with  supper. 30 tablet 1  . ondansetron (ZOFRAN-ODT) 4 MG disintegrating tablet     . pantoprazole (PROTONIX) 40 MG tablet Take 1 tablet (40 mg total) by mouth daily. 30 tablet 1  . potassium chloride SA (KLOR-CON M20) 20 MEQ tablet Take 1 tablet (20 mEq total) by mouth 2 (two) times daily. 30 tablet 0  . propranolol (INNOPRAN XL) 80 MG 24 hr capsule Take by mouth.    . propranolol ER (INDERAL LA) 80 MG 24 hr capsule     . vitamin B-12 (CYANOCOBALAMIN) 1000 MCG tablet Take by mouth.      No current facility-administered medications for this visit.     Musculoskeletal: Strength & Muscle Tone: UTA Gait & Station: normal Patient leans: N/A  Psychiatric Specialty Exam: Review of Systems  Musculoskeletal: Positive for myalgias.  Psychiatric/Behavioral: Positive for dysphoric mood and sleep disturbance.  All other systems reviewed and are negative.   There were no vitals taken for this visit.There is no height or weight on file to calculate BMI.  General Appearance: Casual  Eye Contact:  Fair  Speech:  Normal Rate  Volume:  Normal  Mood:  Depressed  Affect:  Congruent  Thought Process:  Goal Directed and Descriptions of Associations: Intact  Orientation:  Full (Time, Place, and Person)  Thought Content: Logical   Suicidal Thoughts:  No  Homicidal Thoughts:  No  Memory:  Immediate;   Fair Recent;   Fair Remote;   Fair  Judgement:  Intact  Insight:  Fair  Psychomotor Activity:  Normal  Concentration:  Concentration: Fair and Attention Span: Fair  Recall:  [employer] of Knowledge: Fair  Language: Fair  Akathisia:  No  Handed:  Right  AIMS (if indicated): UTA  Assets:  Communication Skills Desire for Improvement Housing Social Support  ADL's:  Intact  Cognition: WNL  Sleep:  Improving   Screenings:   Assessment and Plan: Kaylee Gordon is a 59 year old Caucasian female, married, lives in [place], has a history of bipolar disorder, anxiety disorder, fibromyalgia, IBS  was evaluated by telemedicine today.  Patient is currently struggling with depressive symptoms, does have multiple psychosocial stressors including her fibromyalgia, relationship struggles with her father.  She will continue to benefit from medication readjustment and psychotherapy sessions.  Plan Bipolar disorder-unstable Increase Latuda to 40 mg p.o. daily with supper Lamictal 100 mg p.o. daily divided dosage Lexapro 15 mg p.o. daily Klonopin 0.5 mg p.o. daily as needed for severe anxiety attacks only.  She is limiting use. Lunesta 3 mg p.o. nightly for sleep Hydroxyzine 50 mg p.o. twice daily as needed for anxiety  GAD-improving Continue CBT with Ms. Miguel Dibble Lexapro 15 mg p.o. daily Klonopin 0.25-0.5 mg p.o. daily as needed for anxiety attacks.   Insomnia-restless but improving Gabapentin as prescribed for fibromyalgia Lunesta 3 mg p.o. nightly as needed  I have reviewed EKG dated July 05, 2020-normal sinus rhythm, [id number].  Will monitor closely.  Follow-up in clinic in 3 weeks or sooner if needed.  I have spent atleast 20 minutes  face to face by video with patient today. More than 50 % of the time was spent for preparing to see the patient ( e.g., review of test, records ), ordering medications and test ,psychoeducation and supportive psychotherapy and care coordination,as well as documenting clinical information in electronic health record,interpreting and communication of test results This note was generated in part or whole with voice recognition software. Voice recognition is usually quite accurate but there are transcription errors that can and very often do occur. I apologize for any typographical errors that were not detected and corrected.        Ursula Alert, MD 07/08/2020, 6:16 PM

## 2020-07-26 ENCOUNTER — Other Ambulatory Visit: Payer: Self-pay | Admitting: Psychiatry

## 2020-07-26 DIAGNOSIS — F3130 Bipolar disorder, current episode depressed, mild or moderate severity, unspecified: Secondary | ICD-10-CM

## 2020-07-28 ENCOUNTER — Other Ambulatory Visit: Payer: Self-pay | Admitting: Psychiatry

## 2020-07-28 DIAGNOSIS — F3130 Bipolar disorder, current episode depressed, mild or moderate severity, unspecified: Secondary | ICD-10-CM

## 2020-08-03 ENCOUNTER — Encounter: Payer: Self-pay | Admitting: Psychiatry

## 2020-08-03 ENCOUNTER — Other Ambulatory Visit: Payer: Self-pay

## 2020-08-03 ENCOUNTER — Telehealth (INDEPENDENT_AMBULATORY_CARE_PROVIDER_SITE_OTHER): Payer: Medicare PPO | Admitting: Psychiatry

## 2020-08-03 DIAGNOSIS — F3130 Bipolar disorder, current episode depressed, mild or moderate severity, unspecified: Secondary | ICD-10-CM | POA: Diagnosis not present

## 2020-08-03 DIAGNOSIS — G4701 Insomnia due to medical condition: Secondary | ICD-10-CM | POA: Diagnosis not present

## 2020-08-03 DIAGNOSIS — F411 Generalized anxiety disorder: Secondary | ICD-10-CM | POA: Diagnosis not present

## 2020-08-03 MED ORDER — ESZOPICLONE 3 MG PO TABS: 3.0000 mg | ORAL_TABLET | Freq: Every day | ORAL | 1 refills | Status: DC

## 2020-08-03 NOTE — Progress Notes (Signed)
Virtual Visit via Video Note  I connected with Kaylee Gordon on 08/03/20 at 11:40 AM EST by a video enabled telemedicine application and verified that I am speaking with the correct person using two identifiers.  Location Provider Location : [facility] Patient Location : Home  Participants: Patient , Provider    I discussed the limitations of evaluation and management by telemedicine and the availability of in person appointments. The patient expressed understanding and agreed to proceed.   I discussed the assessment and treatment plan with the patient. The patient was provided an opportunity to ask questions and all were answered. The patient agreed with the plan and demonstrated an understanding of the instructions.   The patient was advised to call back or seek an in-person evaluation if the symptoms worsen or if the condition fails to improve as anticipated.   [facility] MD OP Progress Note  08/03/2020 2:27 PM Kaylee Gordon  MRN:  [id number]  Chief Complaint:  Chief Complaint    Follow-up     HPI: Kaylee Gordon is a 60 year old Caucasian female who has a history of bipolar disorder, GAD, fibromyalgia, problems is married, lives in [place], was evaluated by telemedicine today.  Patient today reports she is currently making some progress on the current medication regimen.  She reports she has more good days than bad days now.  She reports she is compliant on the medications.  The medication dosage increase has definitely helped.  She is tolerating the Latuda well.  Denies side effects.  She reports sleep is improved.  She takes the [place] as prescribed.  Patient reports she likes the effect of clonazepam and currently uses it very rarely.  She is compliant with her psychotherapy visits and reports therapy sessions with Ms. Miguel Dibble as beneficial.  Patient reports she is planning to start yoga and is waiting for the weekend snowstorm to pass.  She reports her daughter is  currently with her for the holidays however will be leaving tomorrow.  She reports that does make her sad however she is glad that her daughter is enjoying her school.  Patient denies any suicidality, homicidality or perceptual disturbances  Visit Diagnosis:    ICD-10-CM   1. Bipolar I disorder, most recent episode depressed ([place])  F31.30    improving  2. GAD (generalized anxiety disorder)  F41.1   3. Insomnia due to medical condition  G47.01 Eszopiclone 3 MG TABS   pain, anxiety    Past Psychiatric History: I have reviewed past psychiatric history from my progress note on 09/11/2018  Past Medical History:  Past Medical History:  Diagnosis Date  . Anxiety   . Asthma   . B12 deficiency   . Cancer [facility])    SKIN CANCER  . CHF (congestive heart failure) ([place])   . Depression   . Hypertension   . Thyroid disease     Past Surgical History:  Procedure Laterality Date  . ANKLE SURGERY Right 2012  . APPENDECTOMY    . AUGMENTATION MAMMAPLASTY Bilateral   . BREAST ENHANCEMENT SURGERY    . COLONOSCOPY WITH PROPOFOL N/A 02/22/2015   Procedure: COLONOSCOPY WITH PROPOFOL;  Surgeon: Josefine Class, MD;  Location: [facility] ENDOSCOPY;  Service: Endoscopy;  Laterality: N/A;  . ESOPHAGOGASTRODUODENOSCOPY (EGD) WITH PROPOFOL N/A 02/22/2015   Procedure: ESOPHAGOGASTRODUODENOSCOPY (EGD) WITH PROPOFOL;  Surgeon: Josefine Class, MD;  Location: [facility] ENDOSCOPY;  Service: Endoscopy;  Laterality: N/A;  . ESOPHAGOGASTRODUODENOSCOPY (EGD) WITH PROPOFOL N/A 02/11/2016   Procedure: ESOPHAGOGASTRODUODENOSCOPY (EGD)  WITH PROPOFOL;  Surgeon: Manya Silvas, MD;  Location: [facility] ENDOSCOPY;  Service: Endoscopy;  Laterality: N/A;  . HEMORRHOID SURGERY    . NASAL SINUS SURGERY    . SAVORY DILATION N/A 02/22/2015   Procedure: SAVORY DILATION;  Surgeon: Josefine Class, MD;  Location: [facility] ENDOSCOPY;  Service: Endoscopy;  Laterality: N/A;  . SAVORY DILATION N/A 02/11/2016   Procedure: SAVORY DILATION;  Surgeon:  Manya Silvas, MD;  Location: [facility] ENDOSCOPY;  Service: Endoscopy;  Laterality: N/A;  . SKIN CANCER EXCISION      Family Psychiatric History: I have reviewed family psychiatric history from my progress note on 09/11/2018  Family History:  Family History  Problem Relation Age of Onset  . Cancer Mother        colon cancer  . Depression Mother   . Anxiety disorder Mother   . Heart disease Father   . Cancer Father        ? neck cancer- still living.   . Anxiety disorder Father   . Depression Father   . Hypoparathyroidism Son   . Colon cancer Maternal Aunt        died in 38s.     Social History: Reviewed social history from my progress note on 09/11/2018 Social History   Socioeconomic History  . Marital status: Married    Spouse name: Legrand Como  . Number of children: Not on file  . Years of education: Not on file  . Highest education level: Not on file  Occupational History  . Not on file  Tobacco Use  . Smoking status: Never Smoker  . Smokeless tobacco: Never Used  Vaping Use  . Vaping Use: Not on file  Substance and Sexual Activity  . Alcohol use: No    Alcohol/week: 0.0 standard drinks    Comment: MAYBE ONCE OR TWICE A MONTH  . Drug use: No  . Sexual activity: Not Currently    Partners: Male  Other Topics Concern  . Not on file  Social History Narrative   Lives in [place]; taught high school- math; retd; no smoking; social alcohol.    Social Determinants of Health   Financial Resource Strain: Not on file  Food Insecurity: Not on file  Transportation Needs: Not on file  Physical Activity: Not on file  Stress: Not on file  Social Connections: Not on file    Allergies:  Allergies  Allergen Reactions  . Atorvastatin   . Hydrochlorothiazide Other (See Comments)    Intolerance - dry lips  . Parafon Forte Dsc [Chlorzoxazone]   . Pollen Extract   . Sudafed [Pseudoephedrine Hcl]     Heart racing    Metabolic Disorder Labs: No results found for:  HGBA1C, MPG Lab Results  Component Value Date   PROLACTIN 18.3 07/22/2014   No results found for: CHOL, TRIG, HDL, CHOLHDL, VLDL, LDLCALC Lab Results  Component Value Date   TSH 1.14 04/05/2020   TSH 3.95 12/17/2019    Therapeutic Level Labs: No results found for: LITHIUM Lab Results  Component Value Date   VALPROATE 39 (L) 02/04/2019   No components found for:  CBMZ  Current Medications: Current Outpatient Medications  Medication Sig Dispense Refill  . azelastine (ASTELIN) 0.1 % nasal spray Place into the nose.    . calcitRIOL (ROCALTROL) 0.25 MCG capsule TAKE ONE CAPSULE BY MOUTH DAILY 90 capsule 1  . clonazePAM (KLONOPIN) 0.5 MG tablet Take 0.5-1 tablets (0.25-0.5 mg total) by mouth as directed. Take half to 1 tablet  daily up to 2-3 times a week only for severe anxiety 12 tablet 1  . escitalopram (LEXAPRO) 10 MG tablet Take 1.5 tablets (15 mg total) by mouth daily. 45 tablet 1  . Eszopiclone 3 MG TABS Take 1 tablet (3 mg total) by mouth at bedtime. Take immediately before bedtime 30 tablet 1  . fluticasone (FLONASE) 50 MCG/ACT nasal spray     . gabapentin (NEURONTIN) 300 MG capsule Take 300 mg by mouth. 2 CAP AM, 1 CAP Q noon, 2 CAP pm,    . hydrOXYzine (VISTARIL) 50 MG capsule Take 1 capsule (50 mg total) by mouth 2 (two) times daily as needed for anxiety. 60 capsule 1  . lamoTRIgine (LAMICTAL) 100 MG tablet TAKE 1/2 TABLET BY MOUTH TWICE A DAY 90 tablet 0  . levothyroxine (SYNTHROID) 75 MCG tablet TAKE ONE TABLET BY MOUTH DAILY BEFORE BREAKFAST 90 tablet 1  . lisinopril (ZESTRIL) 10 MG tablet Take 10 mg by mouth daily.    Marland Kitchen lovastatin (MEVACOR) 20 MG tablet Take 20 mg by mouth at bedtime.    Marland Kitchen lurasidone (LATUDA) 40 MG TABS tablet Take 1 tablet (40 mg total) by mouth daily with supper. 30 tablet 1  . ondansetron (ZOFRAN-ODT) 4 MG disintegrating tablet     . pantoprazole (PROTONIX) 40 MG tablet Take 1 tablet (40 mg total) by mouth daily. 30 tablet 1  . potassium chloride SA  (KLOR-CON M20) 20 MEQ tablet Take 1 tablet (20 mEq total) by mouth 2 (two) times daily. 30 tablet 0  . propranolol (INNOPRAN XL) 80 MG 24 hr capsule Take by mouth.    . propranolol ER (INDERAL LA) 80 MG 24 hr capsule     . vitamin B-12 (CYANOCOBALAMIN) 1000 MCG tablet Take by mouth.      No current facility-administered medications for this visit.     Musculoskeletal: Strength & Muscle Tone: UTA Gait & Station: UTA Patient leans: N/A  Psychiatric Specialty Exam: Review of Systems  Musculoskeletal: Positive for arthralgias.  Psychiatric/Behavioral: The patient is nervous/anxious.   All other systems reviewed and are negative.   There were no vitals taken for this visit.There is no height or weight on file to calculate BMI.  General Appearance: Casual  Eye Contact:  Fair  Speech:  Clear and Coherent  Volume:  Normal  Mood:  Anxious Improving  Affect:  Congruent  Thought Process:  Goal Directed and Descriptions of Associations: Intact  Orientation:  Full (Time, Place, and Person)  Thought Content: Logical   Suicidal Thoughts:  No  Homicidal Thoughts:  No  Memory:  Immediate;   Fair Recent;   Fair Remote;   Fair  Judgement:  Fair  Insight:  Fair  Psychomotor Activity:  Normal  Concentration:  Concentration: Fair and Attention Span: Fair  Recall:  [employer] of Knowledge: Fair  Language: Fair  Akathisia:  No  Handed:  Right  AIMS (if indicated): UTA  Assets:  Communication Skills Desire for Improvement Housing Social Support  ADL's:  Intact  Cognition: WNL  Sleep:  Improving   Screenings:   Assessment and Plan: CLARRISSA SUNNY is a 60 year old Caucasian female, married, lives in [place], has a history of bipolar disorder, anxiety disorder, fibromyalgia, IBS was evaluated by telemedicine today.  Patient is currently making progress with regards to her mood.  She however does have multiple psychosocial stressors including fibromyalgia, relationship struggles.   She will continue to benefit from medications and psychotherapy sessions.  Plan as noted below.  Plan Bipolar disorder-improving Latuda 40 mg p.o. daily with supper Lamictal 100 mg p.o. daily divided dosage Lexapro 15 mg p.o. daily. Klonopin 0.5 mg p.o. daily as needed for severe anxiety attacks only.  She is limiting use. Lunesta 3 mg p.o. nightly for sleep Hydroxyzine 50 mg p.o. twice daily as needed for anxiety  GAD- improving Continue CBT with Ms. Miguel Dibble Lexapro 15 mg p.o. daily Klonopin 0.25-0.5 mg p.o. daily as needed for anxiety attacks   Insomnia-restless- improving Gabapentin as prescribed for fibromyalgia Lunesta 3 mg p.o. nightly as needed  Follow-up in clinic in 2 to 3 weeks or sooner if needed.  I have spent atleast 20 minutes face to face by video with patient today. More than 50 % of the time was spent for preparing to see the patient ( e.g., review of test, records ), ordering medications and test ,psychoeducation and supportive psychotherapy and care coordination,as well as documenting clinical information in electronic health record. This note was generated in part or whole with voice recognition software. Voice recognition is usually quite accurate but there are transcription errors that can and very often do occur. I apologize for any typographical errors that were not detected and corrected.       Ursula Alert, MD 08/03/2020, 2:27 PM

## 2020-08-04 ENCOUNTER — Other Ambulatory Visit: Payer: Self-pay

## 2020-08-05 ENCOUNTER — Ambulatory Visit: Payer: Medicare PPO | Admitting: Endocrinology

## 2020-08-05 ENCOUNTER — Encounter: Payer: Self-pay | Admitting: Endocrinology

## 2020-08-05 VITALS — BP 132/80 | HR 79 | Ht 60.0 in | Wt 176.2 lb

## 2020-08-05 DIAGNOSIS — E782 Mixed hyperlipidemia: Secondary | ICD-10-CM | POA: Diagnosis not present

## 2020-08-05 DIAGNOSIS — E200 Idiopathic hypoparathyroidism: Secondary | ICD-10-CM

## 2020-08-05 DIAGNOSIS — E038 Other specified hypothyroidism: Secondary | ICD-10-CM | POA: Diagnosis not present

## 2020-08-05 DIAGNOSIS — D649 Anemia, unspecified: Secondary | ICD-10-CM

## 2020-08-05 LAB — VITAMIN B12: Vitamin B-12: 274 pg/mL (ref 211–911)

## 2020-08-05 LAB — TSH: TSH: 1.01 u[IU]/mL (ref 0.35–4.50)

## 2020-08-05 LAB — T4, FREE: Free T4: 0.86 ng/dL (ref 0.60–1.60)

## 2020-08-05 NOTE — Progress Notes (Signed)
Patient ID: Kaylee Gordon, female   DOB: 06-23-1961, 60 y.o.   MRN: [id number]    History of Present Illness:  Chief complaint: Endocrinology follow-up    Hypothyroidism:  She was initially given thyroxine supplement with a borderline TSH levels in 2011 However because of complaints of  fatigue on her visit in 10/15 she had thyroid levels done and free T4 was significantly low She was empirically started on 25 g  of levothyroxine Initially she felt less tired with this; however her free T4 continue to be low and her dose has been increased to 75 g since 1/16 when her free T4 was still low at 0.59  She is taking 75 mcg levothyroxine since her free T4 was relatively low in 6/21  She has longstanding fatigue which is partly related to fibromyalgia No unusual cold intolerance or weight change  She is taking her levothyroxine daily in the morning before breakfast . She takes her calcium in the evening after dinner   Wt Readings from Last 3 Encounters:  08/05/20 176 lb 3.2 oz (79.9 kg)  04/05/20 177 lb (80.3 kg)  03/12/20 177 lb (80.3 kg)    Lab Results  Component Value Date   FREET4 1.01 04/05/2020   FREET4 0.70 12/17/2019   FREET4 0.84 05/22/2019   TSH 1.14 04/05/2020   TSH 3.95 12/17/2019   TSH 3.66 05/22/2019    Hypoparathyroidism    Has had hypocalcemia since she was 61 years old and presented with symptoms of cramping in her hands along with twitching of her face and eyes. She was diagnosed at [facility] as having primary idiopathic hypoparathyroidism.   No symptoms of twitching or tingling in her face, muscle cramping in her hands. She said that sometimes when she has her GI problems she will have mild tingling in her hands or cramping  She is taking calcitriol 0.25 mcg daily in the morning and her calcium tablet which has 1200 mg once daily after dinner She takes her supplements very regularly  Last calcium normal done recently from PCP =  9.0 as of 07/15/2020  Lab Results  Component Value Date   CALCIUM 9.1 04/05/2020   CALCIUM 8.7 (L) 03/12/2020   CALCIUM 9.0 02/24/2020   CALCIUM 9.4 12/17/2019   CALCIUM 9.3 05/22/2019   Lab Results  Component Value Date   K 4.2 04/05/2020      Allergies as of 08/05/2020      Reactions   Atorvastatin    Hydrochlorothiazide Other (See Comments)   Intolerance - dry lips   Parafon Forte Dsc [chlorzoxazone]    Pollen Extract    Sudafed [pseudoephedrine Hcl]    Heart racing      Medication List       Accurate as of August 05, 2020  2:03 PM. If you have any questions, ask your nurse or doctor.        azelastine 0.1 % nasal spray Commonly known as: ASTELIN Place into the nose.   calcitRIOL 0.25 MCG capsule Commonly known as: ROCALTROL TAKE ONE CAPSULE BY MOUTH DAILY   clonazePAM 0.5 MG tablet Commonly known as: KlonoPIN Take 0.5-1 tablets (0.25-0.5 mg total) by mouth as directed. Take half to 1 tablet daily up to 2-3 times a week only for severe anxiety   escitalopram 10 MG tablet Commonly known as: LEXAPRO Take 1.5 tablets (15 mg total) by mouth daily.   Eszopiclone 3 MG Tabs Take 1 tablet (  3 mg total) by mouth at bedtime. Take immediately before bedtime   fluticasone 50 MCG/ACT nasal spray Commonly known as: FLONASE   gabapentin 300 MG capsule Commonly known as: NEURONTIN Take 300 mg by mouth. 2 CAP AM, 1 CAP Q noon, 2 CAP pm,   hydrOXYzine 50 MG capsule Commonly known as: Vistaril Take 1 capsule (50 mg total) by mouth 2 (two) times daily as needed for anxiety.   lamoTRIgine 100 MG tablet Commonly known as: LAMICTAL TAKE 1/2 TABLET BY MOUTH TWICE A DAY   levothyroxine 75 MCG tablet Commonly known as: SYNTHROID TAKE ONE TABLET BY MOUTH DAILY BEFORE BREAKFAST   lisinopril 10 MG tablet Commonly known as: ZESTRIL Take 10 mg by mouth daily.   lovastatin 20 MG tablet Commonly known as: MEVACOR Take 20 mg by mouth at bedtime.   lurasidone 40 MG  Tabs tablet Commonly known as: LATUDA Take 1 tablet (40 mg total) by mouth daily with supper.   ondansetron 4 MG disintegrating tablet Commonly known as: ZOFRAN-ODT   pantoprazole 40 MG tablet Commonly known as: PROTONIX Take 1 tablet (40 mg total) by mouth daily.   potassium chloride SA 20 MEQ tablet Commonly known as: Klor-Con M20 Take 1 tablet (20 mEq total) by mouth 2 (two) times daily.   propranolol 80 MG 24 hr capsule Commonly known as: INNOPRAN XL Take by mouth.   propranolol ER 80 MG 24 hr capsule Commonly known as: INDERAL LA   vitamin B-12 1000 MCG tablet Commonly known as: CYANOCOBALAMIN Take by mouth.       Allergies:  Allergies  Allergen Reactions  . Atorvastatin   . Hydrochlorothiazide Other (See Comments)    Intolerance - dry lips  . Parafon Forte Dsc [Chlorzoxazone]   . Pollen Extract   . Sudafed [Pseudoephedrine Hcl]     Heart racing    Past Medical History:  Diagnosis Date  . Anxiety   . Asthma   . B12 deficiency   . Cancer [facility])    SKIN CANCER  . CHF (congestive heart failure) ([place])   . Depression   . Hypertension   . Thyroid disease     Past Surgical History:  Procedure Laterality Date  . ANKLE SURGERY Right 2012  . APPENDECTOMY    . AUGMENTATION MAMMAPLASTY Bilateral   . BREAST ENHANCEMENT SURGERY    . COLONOSCOPY WITH PROPOFOL N/A 02/22/2015   Procedure: COLONOSCOPY WITH PROPOFOL;  Surgeon: Josefine Class, MD;  Location: [facility] ENDOSCOPY;  Service: Endoscopy;  Laterality: N/A;  . ESOPHAGOGASTRODUODENOSCOPY (EGD) WITH PROPOFOL N/A 02/22/2015   Procedure: ESOPHAGOGASTRODUODENOSCOPY (EGD) WITH PROPOFOL;  Surgeon: Josefine Class, MD;  Location: [facility] ENDOSCOPY;  Service: Endoscopy;  Laterality: N/A;  . ESOPHAGOGASTRODUODENOSCOPY (EGD) WITH PROPOFOL N/A 02/11/2016   Procedure: ESOPHAGOGASTRODUODENOSCOPY (EGD) WITH PROPOFOL;  Surgeon: Manya Silvas, MD;  Location: [facility] ENDOSCOPY;  Service: Endoscopy;  Laterality: N/A;  .  HEMORRHOID SURGERY    . NASAL SINUS SURGERY    . SAVORY DILATION N/A 02/22/2015   Procedure: SAVORY DILATION;  Surgeon: Josefine Class, MD;  Location: [facility] ENDOSCOPY;  Service: Endoscopy;  Laterality: N/A;  . SAVORY DILATION N/A 02/11/2016   Procedure: SAVORY DILATION;  Surgeon: Manya Silvas, MD;  Location: [facility] ENDOSCOPY;  Service: Endoscopy;  Laterality: N/A;  . SKIN CANCER EXCISION      Family History  Problem Relation Age of Onset  . Cancer Mother        colon cancer  . Depression Mother   . Anxiety  disorder Mother   . Heart disease Father   . Cancer Father        ? neck cancer- still living.   . Anxiety disorder Father   . Depression Father   . Hypoparathyroidism Son   . Colon cancer Maternal Aunt        died in 74s.     Social History:  reports that she has never smoked. She has never used smokeless tobacco. She reports that she does not drink alcohol and does not use drugs.  Review of Systems    History of depression, is on duloxetine, gabapentin and Seroquel long-term  She takes vitamin B12 supplements, last B12 level normal  She is also asking about triglycerides of about 450, also has a fasting glucose of 103.  Labs were done in 12/31 by PCP but has not had a follow-up discussion yet  EXAM:  BP 132/80   Pulse 79   Ht 5' (1.524 m)   Wt 176 lb 3.2 oz (79.9 kg)   SpO2 97%   BMI 34.41 kg/m     Assessment/Plan:    Hypoparathyroidism: Her symptoms are well controlled with 0.25 mcg of calcitriol and one calcium tablet daily Recent calcium normal done by PCP However she says that she is having mild symptoms when she has diarrhea or other GI symptoms Advised her that she can take an extra calcium when she has the symptoms  Secondary HYPOTHYROIDISM: She has had persistent fatigue but no other symptoms suggestive of hypothyroidism This is explained by other concomitant issues  Currently on a stable dose of 75 mcg levothyroxine; needs follow-up labs  today   Recent anemia: She has mild anemia but iron was normal, will check B12 today She will need to follow-up with her PCP  Since she is going to see her PCP this month she will discuss options for treatment for high triglyceride Advised her to cut back on high carbohydrate foods and also follow a low fat intake Likely she will need to be on fenofibrate   Elayne Snare 08/05/2020, 2:03 PM    ADDENDUM: Labs including B12 are normal

## 2020-08-16 DIAGNOSIS — N183 Chronic kidney disease, stage 3 unspecified: Secondary | ICD-10-CM | POA: Insufficient documentation

## 2020-08-16 DIAGNOSIS — Z86010 Personal history of colonic polyps: Secondary | ICD-10-CM | POA: Insufficient documentation

## 2020-08-20 ENCOUNTER — Other Ambulatory Visit: Payer: Self-pay | Admitting: Psychiatry

## 2020-08-20 DIAGNOSIS — F411 Generalized anxiety disorder: Secondary | ICD-10-CM

## 2020-08-24 ENCOUNTER — Telehealth (INDEPENDENT_AMBULATORY_CARE_PROVIDER_SITE_OTHER): Payer: Medicare PPO | Admitting: Psychiatry

## 2020-08-24 ENCOUNTER — Encounter: Payer: Self-pay | Admitting: Psychiatry

## 2020-08-24 ENCOUNTER — Other Ambulatory Visit: Payer: Self-pay

## 2020-08-24 DIAGNOSIS — F3130 Bipolar disorder, current episode depressed, mild or moderate severity, unspecified: Secondary | ICD-10-CM | POA: Diagnosis not present

## 2020-08-24 DIAGNOSIS — G4701 Insomnia due to medical condition: Secondary | ICD-10-CM

## 2020-08-24 DIAGNOSIS — F411 Generalized anxiety disorder: Secondary | ICD-10-CM

## 2020-08-24 MED ORDER — HYDROXYZINE PAMOATE 50 MG PO CAPS: 50.0000 mg | ORAL_CAPSULE | Freq: Two times a day (BID) | ORAL | 1 refills | Status: DC | PRN

## 2020-08-24 MED ORDER — LURASIDONE HCL 40 MG PO TABS
40.0000 mg | ORAL_TABLET | Freq: Every day | ORAL | 1 refills | Status: DC
Start: 2020-08-24 — End: 2020-09-20

## 2020-08-24 MED ORDER — CLONAZEPAM 0.5 MG PO TABS: 0.2500 mg | ORAL_TABLET | ORAL | 1 refills | Status: DC

## 2020-08-24 NOTE — Progress Notes (Signed)
Virtual Visit via Video Note  I connected with Kaylee Gordon on 08/24/20 at  2:30 PM EST by a video enabled telemedicine application and verified that I am speaking with the correct person using two identifiers.  Location Provider Location : [facility] Kaylee Gordon Location : Home  Participants: Kaylee Gordon , Provider   I discussed the limitations of evaluation and management by telemedicine and the availability of in person appointments. The Kaylee Gordon expressed understanding and agreed to proceed.    I discussed the assessment and treatment plan with the Kaylee Gordon. The Kaylee Gordon was provided an opportunity to ask questions and all were answered. The Kaylee Gordon agreed with the plan and demonstrated an understanding of the instructions.   The Kaylee Gordon was advised to call back or seek an in-person evaluation if the symptoms worsen or if the condition fails to improve as anticipated.   [place] MD OP Progress Note  08/24/2020 3:00 PM Kaylee Gordon  MRN:  [id number]  Chief Complaint:  Chief Complaint    Follow-up     HPI: Kaylee Gordon is a 60 year old Caucasian female who has a history of bipolar disorder, GAD, fibromyalgia, is married, lives in [place], was evaluated by telemedicine today.  Kaylee Gordon today reports she had oral surgery last week and is currently recovering from the same.  She was taking antibiotic as well as pain medication.  She reports she did struggle with her mood, was anxious and frustrated for a few days.  Kaylee Gordon however reports she is coping better.  She reports she is compliant on her medications as prescribed.  They are keeping her mood symptoms stable.  She reports she had a panic attack a month ago after returning from a vacation at the beach.  That may have been triggered by her son driving the car that day.  She reports she feels that was the worst panic attack she had in a long time.  It took her a long time to feel better.  She had shortness of breath, heart palpitation and  her blood pressure was high.  She however reports she has not had another attack since then.  Kaylee Gordon continues to be in psychotherapy sessions and reports therapy sessions as beneficial.  She is reading a book-called codependent no more' and reports she is enjoying the book.  Kaylee Gordon denies any suicidality, homicidality or perceptual disturbances.  Kaylee Gordon denies any other concerns today.  Visit Diagnosis:    ICD-10-CM   1. Bipolar I disorder, most recent episode depressed (HCC)  F31.30 lurasidone (LATUDA) 40 MG TABS tablet   improving  2. GAD (generalized anxiety disorder)  F41.1 clonazePAM (KLONOPIN) 0.5 MG tablet  3. 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 Anxiety   . Asthma   . B12 deficiency   . Cancer [facility])    SKIN CANCER  . CHF (congestive heart failure) ([place])   . Depression   . Hypertension   . Thyroid disease     Past Surgical History:  Procedure Laterality Date  . ANKLE SURGERY Right 2012  . APPENDECTOMY    . AUGMENTATION MAMMAPLASTY Bilateral   . BREAST ENHANCEMENT SURGERY    . COLONOSCOPY WITH PROPOFOL N/A 02/22/2015   Procedure: COLONOSCOPY WITH PROPOFOL;  Surgeon: Josefine Class, MD;  Location[facility] ENDOSCOPY;  Service: Endoscopy;  Laterality: N/A;  . ESOPHAGOGASTRODUODENOSCOPY (EGD) WITH PROPOFOL N/A 02/22/2015   Procedure: ESOPHAGOGASTRODUODENOSCOPY (EGD) WITH PROPOFOL;  Surgeon: Josefine Class, MD;  Location: [facility] ENDOSCOPY;  Service: Endoscopy;  Laterality: N/A;  . ESOPHAGOGASTRODUODENOSCOPY (EGD) WITH PROPOFOL N/A 02/11/2016   Procedure: ESOPHAGOGASTRODUODENOSCOPY (EGD) WITH PROPOFOL;  Surgeon: Manya Silvas, MD;  Location: [facility] ENDOSCOPY;  Service: Endoscopy;  Laterality: N/A;  . HEMORRHOID SURGERY    . NASAL SINUS  SURGERY    . SAVORY DILATION N/A 02/22/2015   Procedure: SAVORY DILATION;  Surgeon: Josefine Class, MD;  Location[facility] ENDOSCOPY;  Service: Endoscopy;  Laterality: N/A;  . SAVORY DILATION N/A 02/11/2016   Procedure: SAVORY DILATION;  Surgeon: Manya Silvas, MD;  Location: [facility] ENDOSCOPY;  Service: Endoscopy;  Laterality: N/A;  . SKIN CANCER EXCISION      Family Psychiatric History: I have reviewed family psychiatric history from my progress note on 09/11/2018  Family History:  Family History  Problem Relation Age of Onset  . Cancer Mother        colon cancer  . Depression Mother   . Anxiety disorder Mother   . Heart disease Father   . Cancer Father        ? neck cancer- still living.   . Anxiety disorder Father   . Depression Father   . Hypoparathyroidism Son   . Colon cancer Maternal Aunt        died in 18s.     Social History: I have reviewed social history from my progress note on 09/11/2018 Social History   Socioeconomic History  . Marital status: Married    Spouse name: Legrand Como  . Number of children: Not on file  . Years of education: Not on file  . Highest education level: Not on file  Occupational History  . Not on file  Tobacco Use  . Smoking status: Never Smoker  . Smokeless tobacco: Never Used  Vaping Use  . Vaping Use: Not on file  Substance and Sexual Activity  . Alcohol use: No    Alcohol/week: 0.0 standard drinks    Comment: MAYBE ONCE OR TWICE A MONTH  . Drug use: No  . Sexual activity: Not Currently    Partners: Male  Other Topics Concern  . Not on file  Social History Narrative   Lives in [place]; taught high school- math; retd; no smoking; social alcohol.    Social Determinants of Health   Financial Resource Strain: Not on file  Food Insecurity: Not on file  Transportation Needs: Not on file  Physical Activity: Not on file  Stress: Not on file  Social Connections: Not on file    Allergies:  Allergies  Allergen Reactions  .  Atorvastatin   . Hydrochlorothiazide Other (See Comments)    Intolerance - dry lips  . Parafon Forte Dsc [Chlorzoxazone]   . Pollen Extract   . Sudafed [Pseudoephedrine Hcl]     Heart racing    Metabolic Disorder Labs: No results found for: HGBA1C, MPG Lab Results  Component Value Date   PROLACTIN 18.3 07/22/2014   No results found for: CHOL, TRIG, HDL, CHOLHDL, VLDL, LDLCALC Lab Results  Component Value Date   TSH 1.01 08/05/2020   TSH 1.14 04/05/2020    Therapeutic Level Labs: No results found for: LITHIUM Lab Results  Component Value Date   VALPROATE 39 (L) 02/04/2019   No components found for:  CBMZ  Current Medications: Current Outpatient Medications  Medication Sig Dispense Refill  .  fenofibrate 160 MG tablet Take by mouth.    Marland Kitchen azelastine (ASTELIN) 0.1 % nasal spray Place into the nose.    . calcitRIOL (ROCALTROL) 0.25 MCG capsule TAKE ONE CAPSULE BY MOUTH DAILY 90 capsule 1  . clonazePAM (KLONOPIN) 0.5 MG tablet Take 0.5-1 tablets (0.25-0.5 mg total) by mouth as directed. Take half to 1 tablet daily up to 2-3 times a week only for severe anxiety 12 tablet 1  . escitalopram (LEXAPRO) 10 MG tablet TAKE ONE AND ONE-HALF TABLETS BY MOUTH DAILY 45 tablet 1  . Eszopiclone 3 MG TABS Take 1 tablet (3 mg total) by mouth at bedtime. Take immediately before bedtime 30 tablet 1  . fluticasone (FLONASE) 50 MCG/ACT nasal spray     . gabapentin (NEURONTIN) 300 MG capsule Take 300 mg by mouth. 2 CAP AM, 1 CAP Q noon, 2 CAP pm,    . hydrOXYzine (VISTARIL) 50 MG capsule Take 1 capsule (50 mg total) by mouth 2 (two) times daily as needed for anxiety. 60 capsule 1  . lamoTRIgine (LAMICTAL) 100 MG tablet TAKE 1/2 TABLET BY MOUTH TWICE A DAY 90 tablet 0  . levothyroxine (SYNTHROID) 75 MCG tablet TAKE ONE TABLET BY MOUTH DAILY BEFORE BREAKFAST 90 tablet 1  . lisinopril (ZESTRIL) 10 MG tablet Take 10 mg by mouth daily.    Marland Kitchen lovastatin (MEVACOR) 20 MG tablet Take 20 mg by mouth at  bedtime.    Marland Kitchen lurasidone (LATUDA) 40 MG TABS tablet Take 1 tablet (40 mg total) by mouth daily with supper. 30 tablet 1  . ondansetron (ZOFRAN-ODT) 4 MG disintegrating tablet     . pantoprazole (PROTONIX) 40 MG tablet Take 1 tablet (40 mg total) by mouth daily. 30 tablet 1  . potassium chloride SA (KLOR-CON M20) 20 MEQ tablet Take 1 tablet (20 mEq total) by mouth 2 (two) times daily. 30 tablet 0  . propranolol (INNOPRAN XL) 80 MG 24 hr capsule Take by mouth.    . propranolol ER (INDERAL LA) 80 MG 24 hr capsule     . vitamin B-12 (CYANOCOBALAMIN) 1000 MCG tablet Take by mouth.      No current facility-administered medications for this visit.     Musculoskeletal: Strength & Muscle Tone: UTA Gait & Station: UTA Kaylee Gordon leans: N/A  Psychiatric Specialty Exam: Review of Systems  HENT:       S/P ORAL SURGERY - pain improving   Psychiatric/Behavioral: The Kaylee Gordon is nervous/anxious.   All other systems reviewed and are negative.   There were no vitals taken for this visit.There is no height or weight on file to calculate BMI.  General Appearance: Casual  Eye Contact:  Fair  Speech:  Clear and Coherent  Volume:  Normal  Mood:  Anxious coping well  Affect:  Congruent  Thought Process:  Goal Directed and Descriptions of Associations: Intact  Orientation:  Full (Time, Place, and Person)  Thought Content: Logical   Suicidal Thoughts:  No  Homicidal Thoughts:  No  Memory:  Immediate;   Fair Recent;   Fair Remote;   Fair  Judgement:  Fair  Insight:  Fair  Psychomotor Activity:  Normal  Concentration:  Concentration: Fair and Attention Span: Fair  Recall:  [employer] of Knowledge: Fair  Language: Fair  Akathisia:  No  Handed:  Right  AIMS (if indicated): UTA  Assets:  Communication Skills Desire for Improvement Housing Social Support  ADL's:  Intact  Cognition: WNL  Sleep:  Fair   Screenings:  Assessment and Plan: Kaylee Gordon is a 60 year old Caucasian female,  married, lives in [place], has a history of bipolar disorder, anxiety disorder, fibromyalgia, IBS was evaluated by telemedicine today.  Kaylee Gordon is currently making some progress on the current medication regiment.  Plan as noted below.  Plan Bipolar disorder-improving Latuda 40 mg p.o. daily with supper. Lamictal 100 mg p.o. daily in divided dosage. Lexapro 15 mg p.o. daily. Klonopin 0.5 mg p.o. daily as needed for severe anxiety attacks.  She is limiting use. Lunesta 3 mg p.o. nightly for sleep Hydroxyzine 50 mg p.o. twice daily as needed for anxiety.  GAD-improving Continue CBT with Ms. Miguel Dibble Lexapro 15 mg p.o. daily Klonopin 0.25-0.5 mg p.o. daily as needed for anxiety attacks.  Insomnia-restless-improving Gabapentin as prescribed for fibromyalgia Lunesta 3 mg p.o. nightly as needed  Follow-up in clinic in 4 weeks or sooner if needed.  I have spent atleast 20 minutes face to face by video with Kaylee Gordon today. More than 50 % of the time was spent for preparing to see the Kaylee Gordon ( e.g., review of test, records ) , ordering medications and test ,psychoeducation and supportive psychotherapy and care coordination,as well as documenting clinical information in electronic health record. This note was generated in part or whole with voice recognition software. Voice recognition is usually quite accurate but there are transcription errors that can and very often do occur. I apologize for any typographical errors that were not detected and corrected.      Ursula Alert, MD 08/25/2020, 8:27 AM

## 2020-09-01 ENCOUNTER — Ambulatory Visit: Payer: Medicare PPO | Admitting: Dermatology

## 2020-09-08 ENCOUNTER — Ambulatory Visit
Admission: RE | Admit: 2020-09-08 | Discharge: 2020-09-08 | Disposition: A | Discharge status: Home / Self Care | Payer: Medicare PPO | Source: Ambulatory Visit | Attending: Internal Medicine | Admitting: Internal Medicine

## 2020-09-08 ENCOUNTER — Other Ambulatory Visit: Payer: Self-pay

## 2020-09-08 DIAGNOSIS — E209 Hypoparathyroidism, unspecified: Secondary | ICD-10-CM | POA: Insufficient documentation

## 2020-09-08 DIAGNOSIS — R42 Dizziness and giddiness: Secondary | ICD-10-CM | POA: Diagnosis not present

## 2020-09-08 DIAGNOSIS — M899 Disorder of bone, unspecified: Secondary | ICD-10-CM | POA: Diagnosis not present

## 2020-09-13 ENCOUNTER — Inpatient Hospital Stay: Payer: Medicare PPO | Admitting: Internal Medicine

## 2020-09-13 ENCOUNTER — Inpatient Hospital Stay: Payer: Medicare PPO

## 2020-09-20 ENCOUNTER — Telehealth (INDEPENDENT_AMBULATORY_CARE_PROVIDER_SITE_OTHER): Payer: Medicare PPO | Admitting: Psychiatry

## 2020-09-20 ENCOUNTER — Other Ambulatory Visit: Payer: Self-pay

## 2020-09-20 ENCOUNTER — Encounter: Payer: Self-pay | Admitting: Psychiatry

## 2020-09-20 DIAGNOSIS — F411 Generalized anxiety disorder: Secondary | ICD-10-CM | POA: Diagnosis not present

## 2020-09-20 DIAGNOSIS — G4701 Insomnia due to medical condition: Secondary | ICD-10-CM | POA: Diagnosis not present

## 2020-09-20 DIAGNOSIS — F3130 Bipolar disorder, current episode depressed, mild or moderate severity, unspecified: Secondary | ICD-10-CM

## 2020-09-20 MED ORDER — LURASIDONE HCL 60 MG PO TABS: 60.0000 mg | ORAL_TABLET | Freq: Every day | ORAL | 1 refills | Status: DC

## 2020-09-20 MED ORDER — ESZOPICLONE 3 MG PO TABS: 3.0000 mg | ORAL_TABLET | Freq: Every day | ORAL | 1 refills | Status: DC

## 2020-09-20 NOTE — Progress Notes (Signed)
Virtual Visit via Video Note  I connected with Kaylee Gordon on 09/20/20 at 11:00 AM EST by a video enabled telemedicine application and verified that I am speaking with the correct person using two identifiers.  Location Provider Location : [facility] Patient Location : Home  Participants: Patient , Provider   I discussed the limitations of evaluation and management by telemedicine and the availability of in person appointments. The patient expressed understanding and agreed to proceed.     I discussed the assessment and treatment plan with the patient. The patient was provided an opportunity to ask questions and all were answered. The patient agreed with the plan and demonstrated an understanding of the instructions.   The patient was advised to call back or seek an in-person evaluation if the symptoms worsen or if the condition fails to improve as anticipated.   [place] MD OP Progress Note  09/20/2020 9:02 PM Kaylee Gordon  MRN:  [id number]  Chief Complaint:  Chief Complaint    Follow-up; Depression; Anxiety     HPI: Kaylee Gordon is a 60 year old Caucasian female who has a history of bipolar disorder, GAD, fibromyalgia, is married, lives in [place], was evaluated by telemedicine today.  Patient today reports this week she is doing better.  She reports she went through an episode of sadness, anxiety symptoms, feeling unmotivated and so on last week.  However this week her daughter is here to visit and that has made everything better.  She reports she is planning to spend some time with her daughter and has planned out some activities that they can do together.  Patient reports she continues to struggle with her relationship with her father.  She reports she is currently working with her therapist on the same.  Each time she has a conversation with her father she has mood lability, anxiety symptoms for 3 days after that. She recently spoke to him.Patient hence reports mood swings,  rumination, racing thoughts although improving as noted above.  Patient reports sleep is better this week.  She denies any suicidality, homicidality or perceptual disturbances.  She is compliant on her medications.  Denies side effects.  Patient denies any other concerns today.  Visit Diagnosis:    ICD-10-CM   1. Bipolar I disorder, most recent episode depressed (HCC)  F31.30 Lurasidone HCl 60 MG TABS  2. GAD (generalized anxiety disorder)  F41.1   3. Insomnia due to medical condition  G47.01 Eszopiclone 3 MG TABS   pain, anxiety    Past Psychiatric History: I have reviewed past psychiatric history from my progress note on 09/11/2018  Past Medical History:  Past Medical History:  Diagnosis Date  . Anxiety   . Asthma   . B12 deficiency   . Cancer [facility])    SKIN CANCER  . CHF (congestive heart failure) ([place])   . Depression   . Hypertension   . Thyroid disease     Past Surgical History:  Procedure Laterality Date  . ANKLE SURGERY Right 2012  . APPENDECTOMY    . AUGMENTATION MAMMAPLASTY Bilateral   . BREAST ENHANCEMENT SURGERY    . COLONOSCOPY WITH PROPOFOL N/A 02/22/2015   Procedure: COLONOSCOPY WITH PROPOFOL;  Surgeon: Josefine Class, MD;  Location: [facility] ENDOSCOPY;  Service: Endoscopy;  Laterality: N/A;  . ESOPHAGOGASTRODUODENOSCOPY (EGD) WITH PROPOFOL N/A 02/22/2015   Procedure: ESOPHAGOGASTRODUODENOSCOPY (EGD) WITH PROPOFOL;  Surgeon: Josefine Class, MD;  Location[facility] ENDOSCOPY;  Service: Endoscopy;  Laterality: N/A;  . ESOPHAGOGASTRODUODENOSCOPY (EGD) WITH PROPOFOL  N/A 02/11/2016   Procedure: ESOPHAGOGASTRODUODENOSCOPY (EGD) WITH PROPOFOL;  Surgeon: Manya Silvas, MD;  Location: [facility] ENDOSCOPY;  Service: Endoscopy;  Laterality: N/A;  . HEMORRHOID SURGERY    . NASAL SINUS SURGERY    . SAVORY DILATION N/A 02/22/2015   Procedure: SAVORY DILATION;  Surgeon: Josefine Class, MD;  Location: [facility] ENDOSCOPY;  Service: Endoscopy;  Laterality: N/A;  . SAVORY DILATION  N/A 02/11/2016   Procedure: SAVORY DILATION;  Surgeon: Manya Silvas, MD;  Location: [facility] ENDOSCOPY;  Service: Endoscopy;  Laterality: N/A;  . SKIN CANCER EXCISION      Family Psychiatric History: I have reviewed family psychiatric history from my progress note on 09/11/2018  Family History:  Family History  Problem Relation Age of Onset  . Cancer Mother        colon cancer  . Depression Mother   . Anxiety disorder Mother   . Heart disease Father   . Cancer Father        ? neck cancer- still living.   . Anxiety disorder Father   . Depression Father   . Hypoparathyroidism Son   . Colon cancer Maternal Aunt        died in 74s.     Social History: Reviewed social history from my progress note on 09/11/2018 Social History   Socioeconomic History  . Marital status: Married    Spouse name: Legrand Como  . Number of children: Not on file  . Years of education: Not on file  . Highest education level: Not on file  Occupational History  . Not on file  Tobacco Use  . Smoking status: Never Smoker  . Smokeless tobacco: Never Used  Vaping Use  . Vaping Use: Not on file  Substance and Sexual Activity  . Alcohol use: No    Alcohol/week: 0.0 standard drinks    Comment: MAYBE ONCE OR TWICE A MONTH  . Drug use: No  . Sexual activity: Not Currently    Partners: Male  Other Topics Concern  . Not on file  Social History Narrative   Lives in [place]; taught high school- math; retd; no smoking; social alcohol.    Social Determinants of Health   Financial Resource Strain: Not on file  Food Insecurity: Not on file  Transportation Needs: Not on file  Physical Activity: Not on file  Stress: Not on file  Social Connections: Not on file    Allergies:  Allergies  Allergen Reactions  . Atorvastatin   . Hydrochlorothiazide Other (See Comments)    Intolerance - dry lips  . Parafon Forte Dsc [Chlorzoxazone]   . Pollen Extract   . Sudafed [Pseudoephedrine Hcl]     Heart racing     Metabolic Disorder Labs: No results found for: HGBA1C, MPG Lab Results  Component Value Date   PROLACTIN 18.3 07/22/2014   No results found for: CHOL, TRIG, HDL, CHOLHDL, VLDL, LDLCALC Lab Results  Component Value Date   TSH 1.01 08/05/2020   TSH 1.14 04/05/2020    Therapeutic Level Labs: No results found for: LITHIUM Lab Results  Component Value Date   VALPROATE 39 (L) 02/04/2019   No components found for:  CBMZ  Current Medications: Current Outpatient Medications  Medication Sig Dispense Refill  . Lurasidone HCl 60 MG TABS Take 1 tablet (60 mg total) by mouth daily with supper. 30 tablet 1  . azelastine (ASTELIN) 0.1 % nasal spray Place into the nose.    . calcitRIOL (ROCALTROL) 0.25 MCG capsule TAKE ONE CAPSULE  BY MOUTH DAILY 90 capsule 1  . clonazePAM (KLONOPIN) 0.5 MG tablet Take 0.5-1 tablets (0.25-0.5 mg total) by mouth as directed. Take half to 1 tablet daily up to 2-3 times a week only for severe anxiety 12 tablet 1  . escitalopram (LEXAPRO) 10 MG tablet TAKE ONE AND ONE-HALF TABLETS BY MOUTH DAILY 45 tablet 1  . Eszopiclone 3 MG TABS Take 1 tablet (3 mg total) by mouth at bedtime. Take immediately before bedtime 30 tablet 1  . fenofibrate 160 MG tablet Take by mouth.    . fluticasone (FLONASE) 50 MCG/ACT nasal spray     . gabapentin (NEURONTIN) 300 MG capsule Take 300 mg by mouth. 2 CAP AM, 1 CAP Q noon, 2 CAP pm,    . hydrOXYzine (VISTARIL) 50 MG capsule Take 1 capsule (50 mg total) by mouth 2 (two) times daily as needed for anxiety. 60 capsule 1  . lamoTRIgine (LAMICTAL) 100 MG tablet TAKE 1/2 TABLET BY MOUTH TWICE A DAY 90 tablet 0  . levothyroxine (SYNTHROID) 75 MCG tablet TAKE ONE TABLET BY MOUTH DAILY BEFORE BREAKFAST 90 tablet 1  . lisinopril (ZESTRIL) 10 MG tablet Take 10 mg by mouth daily.    Marland Kitchen lovastatin (MEVACOR) 20 MG tablet Take 20 mg by mouth at bedtime.    . ondansetron (ZOFRAN-ODT) 4 MG disintegrating tablet     . pantoprazole (PROTONIX) 40 MG  tablet Take 1 tablet (40 mg total) by mouth daily. 30 tablet 1  . potassium chloride SA (KLOR-CON M20) 20 MEQ tablet Take 1 tablet (20 mEq total) by mouth 2 (two) times daily. 30 tablet 0  . propranolol (INNOPRAN XL) 80 MG 24 hr capsule Take by mouth.    . propranolol ER (INDERAL LA) 80 MG 24 hr capsule     . vitamin B-12 (CYANOCOBALAMIN) 1000 MCG tablet Take by mouth.      No current facility-administered medications for this visit.     Musculoskeletal: Strength & Muscle Tone: UTA Gait & Station: normal Patient leans: N/A  Psychiatric Specialty Exam: Review of Systems  Constitutional: Positive for fatigue.  Musculoskeletal: Positive for myalgias.  Psychiatric/Behavioral: Positive for dysphoric mood.  All other systems reviewed and are negative.   There were no vitals taken for this visit.There is no height or weight on file to calculate BMI.  General Appearance: Casual  Eye Contact:  Fair  Speech:  Clear and Coherent  Volume:  Normal  Mood:  Dysphoric  Affect:  Congruent  Thought Process:  Goal Directed and Descriptions of Associations: Intact  Orientation:  Full (Time, Place, and Person)  Thought Content: Logical   Suicidal Thoughts:  No  Homicidal Thoughts:  No  Memory:  Immediate;   Fair Recent;   Fair Remote;   Fair  Judgement:  Fair  Insight:  Fair  Psychomotor Activity:  Normal  Concentration:  Concentration: Fair and Attention Span: Fair  Recall:  [employer] of Knowledge: Fair  Language: Fair  Akathisia:  No  Handed:  Right  AIMS (if indicated): UTA  Assets:  Communication Skills Desire for Improvement Housing Social Support  ADL's:  Intact  Cognition: WNL  Sleep:  Improving   Screenings: PHQ2-9   Flowsheet Row Video Visit from 09/20/2020 in [place]  PHQ-2 Total Score 4    Flowsheet Row Video Visit from 09/20/2020 in [place] No Risk       Assessment and Plan:  Kaylee Gordon is a  60 year old Caucasian female, married, lives in [place], has a history of bipolar disorder, anxiety disorder, fibromyalgia, IBS was evaluated by telemedicine today.  Patient is currently making progress although she continues to struggle with depressive symptoms.  Will benefit from the following plan.  Plan Bipolar disorder-unstable Increase Latuda to 60 mg p.o. daily with supper Lamictal 100 mg p.o. daily in divided dosage Lexapro 15 mg p.o. daily Klonopin 0.5 mg p.o. daily as needed for severe anxiety attacks.  She is limiting use. Lunesta 3 mg p.o. nightly for sleep Hydroxyzine 50 mg p.o. twice daily as needed for anxiety attacks only  GAD-improving Continue CBT with Ms. Miguel Dibble Lexapro 15 mg p.o. daily Klonopin 0.25-0.5 mg p.o. daily as needed for anxiety attacks.  Patient to limit use.  Insomnia-improving Gabapentin as prescribed for fibromyalgia Lunesta 3 mg p.o. nightly as needed  Follow-up in clinic in 3 to 4 weeks or sooner if needed.  This note was generated in part or whole with voice recognition software. Voice recognition is usually quite accurate but there are transcription errors that can and very often do occur. I apologize for any typographical errors that were not detected and corrected.      Ursula Alert, MD 09/20/2020, 9:02 PM

## 2020-09-27 ENCOUNTER — Inpatient Hospital Stay: Payer: Medicare PPO

## 2020-09-27 ENCOUNTER — Inpatient Hospital Stay: Payer: Medicare PPO | Admitting: Internal Medicine

## 2020-10-13 ENCOUNTER — Telehealth (INDEPENDENT_AMBULATORY_CARE_PROVIDER_SITE_OTHER): Payer: Medicare PPO | Admitting: Psychiatry

## 2020-10-13 ENCOUNTER — Other Ambulatory Visit: Payer: Self-pay

## 2020-10-13 ENCOUNTER — Encounter: Payer: Self-pay | Admitting: Psychiatry

## 2020-10-13 DIAGNOSIS — F3130 Bipolar disorder, current episode depressed, mild or moderate severity, unspecified: Secondary | ICD-10-CM

## 2020-10-13 DIAGNOSIS — F411 Generalized anxiety disorder: Secondary | ICD-10-CM

## 2020-10-13 DIAGNOSIS — G4701 Insomnia due to medical condition: Secondary | ICD-10-CM | POA: Diagnosis not present

## 2020-10-13 MED ORDER — LURASIDONE HCL 80 MG PO TABS: 80.0000 mg | ORAL_TABLET | Freq: Every day | ORAL | 1 refills | Status: DC

## 2020-10-13 MED ORDER — ESCITALOPRAM OXALATE 10 MG PO TABS: 15.0000 mg | ORAL_TABLET | Freq: Every day | ORAL | 1 refills | Status: DC

## 2020-10-13 NOTE — Progress Notes (Signed)
Virtual Visit via Video Note  I connected with Kaylee Gordon on 10/13/20 at  2:20 PM EDT by a video enabled telemedicine application and verified that I am speaking with the correct person using two identifiers.  Location Provider Location : [facility] Patient Location : Home  Participants: Patient , Provider   I discussed the limitations of evaluation and management by telemedicine and the availability of in person appointments. The patient expressed understanding and agreed to proceed.    I discussed the assessment and treatment plan with the patient. The patient was provided an opportunity to ask questions and all were answered. The patient agreed with the plan and demonstrated an understanding of the instructions.   The patient was advised to call back or seek an in-person evaluation if the symptoms worsen or if the condition fails to improve as anticipated.  [place] MD OP Progress Note  10/13/2020 3:34 PM Kaylee Gordon  MRN:  [id number]  Chief Complaint:  Chief Complaint    Follow-up; Anxiety; Depression     HPI: Kaylee Gordon is a 60 year old Caucasian female who has a history of bipolar disorder, GAD, fibromyalgia, married, lives in [place], was evaluated by telemedicine today.  Patient today reports she is currently struggling with depressive symptoms.  She reports sadness, lack of motivation, low energy, anhedonia, concentration problems, fatigue and sleep problems.  Patient reports she is currently struggling with bronchiolitis and went through 3 courses of antibiotics.  She however is feeling better now.  Her cough has improved.  She however continues to struggle with fatigue and tiredness.  Patient reports she has been noncompliant with psychotherapy sessions since she had to miss an appointment due to her health problems and then her therapist canceled another appointment.  She however agrees to get in touch with them.  Patient denies any suicidality, homicidality or  perceptual disturbances.  Patient however reports episodes of feeling of not wanting to be here and feels as though she is a burden on other people.  Denies any suicidal plan.  Patient is compliant on medications.  Patient reports she does have night terrors, she does not remember them however it was brought to her attention by her husband.  She however is not interested in referral for sleep study or sleep consultation at this time.  Patient denies any other concerns today.  Visit Diagnosis:    ICD-10-CM   1. Bipolar I disorder, most recent episode depressed (HCC)  F31.30 lurasidone (LATUDA) 80 MG TABS tablet   severe  2. GAD (generalized anxiety disorder)  F41.1 escitalopram (LEXAPRO) 10 MG tablet  3. Insomnia due to medical condition  G47.01    Pain, anxiety    Past Psychiatric History: I have reviewed past psychiatric history from my progress note on 09/11/2018  Past Medical History:  Past Medical History:  Diagnosis Date  . Anxiety   . Asthma   . B12 deficiency   . Cancer [facility])    SKIN CANCER  . CHF (congestive heart failure) ([place])   . Depression   . Hypertension   . Thyroid disease     Past Surgical History:  Procedure Laterality Date  . ANKLE SURGERY Right 2012  . APPENDECTOMY    . AUGMENTATION MAMMAPLASTY Bilateral   . BREAST ENHANCEMENT SURGERY    . COLONOSCOPY WITH PROPOFOL N/A 02/22/2015   Procedure: COLONOSCOPY WITH PROPOFOL;  Surgeon: Josefine Class, MD;  Location: [facility] ENDOSCOPY;  Service: Endoscopy;  Laterality: N/A;  . ESOPHAGOGASTRODUODENOSCOPY (EGD) WITH PROPOFOL N/A  02/22/2015   Procedure: ESOPHAGOGASTRODUODENOSCOPY (EGD) WITH PROPOFOL;  Surgeon: Josefine Class, MD;  Location: [facility] ENDOSCOPY;  Service: Endoscopy;  Laterality: N/A;  . ESOPHAGOGASTRODUODENOSCOPY (EGD) WITH PROPOFOL N/A 02/11/2016   Procedure: ESOPHAGOGASTRODUODENOSCOPY (EGD) WITH PROPOFOL;  Surgeon: Manya Silvas, MD;  Location: [facility] ENDOSCOPY;  Service: Endoscopy;  Laterality: N/A;   . HEMORRHOID SURGERY    . NASAL SINUS SURGERY    . SAVORY DILATION N/A 02/22/2015   Procedure: SAVORY DILATION;  Surgeon: Josefine Class, MD;  Location: [facility] ENDOSCOPY;  Service: Endoscopy;  Laterality: N/A;  . SAVORY DILATION N/A 02/11/2016   Procedure: SAVORY DILATION;  Surgeon: Manya Silvas, MD;  Location: [facility] ENDOSCOPY;  Service: Endoscopy;  Laterality: N/A;  . SKIN CANCER EXCISION      Family Psychiatric History: I have reviewed family psychiatric history from my progress note on 09/11/2018  Family History:  Family History  Problem Relation Age of Onset  . Cancer Mother        colon cancer  . Depression Mother   . Anxiety disorder Mother   . Heart disease Father   . Cancer Father        ? neck cancer- still living.   . Anxiety disorder Father   . Depression Father   . Hypoparathyroidism Son   . Colon cancer Maternal Aunt        died in 74s.     Social History: I have reviewed social history from my progress note on 09/11/2018 Social History   Socioeconomic History  . Marital status: Married    Spouse name: Legrand Como  . Number of children: Not on file  . Years of education: Not on file  . Highest education level: Not on file  Occupational History  . Not on file  Tobacco Use  . Smoking status: Never Smoker  . Smokeless tobacco: Never Used  Vaping Use  . Vaping Use: Not on file  Substance and Sexual Activity  . Alcohol use: No    Alcohol/week: 0.0 standard drinks    Comment: MAYBE ONCE OR TWICE A MONTH  . Drug use: No  . Sexual activity: Not Currently    Partners: Male  Other Topics Concern  . Not on file  Social History Narrative   Lives in [place]; taught high school- math; retd; no smoking; social alcohol.    Social Determinants of Health   Financial Resource Strain: Not on file  Food Insecurity: Not on file  Transportation Needs: Not on file  Physical Activity: Not on file  Stress: Not on file  Social Connections: Not on file     Allergies:  Allergies  Allergen Reactions  . Levofloxacin Other (See Comments) and Nausea Only    Lethargic, Flu-like symptoms, Hot flashes   . Atorvastatin   . Hydrochlorothiazide Other (See Comments)    Intolerance - dry lips  . Parafon Forte Dsc [Chlorzoxazone]   . Pollen Extract   . Sudafed [Pseudoephedrine Hcl]     Heart racing    Metabolic Disorder Labs: No results found for: HGBA1C, MPG Lab Results  Component Value Date   PROLACTIN 18.3 07/22/2014   No results found for: CHOL, TRIG, HDL, CHOLHDL, VLDL, LDLCALC Lab Results  Component Value Date   TSH 1.01 08/05/2020   TSH 1.14 04/05/2020    Therapeutic Level Labs: No results found for: LITHIUM Lab Results  Component Value Date   VALPROATE 39 (L) 02/04/2019   No components found for:  CBMZ  Current Medications: Current Outpatient Medications  Medication Sig Dispense Refill  . albuterol (VENTOLIN HFA) 108 (90 Base) MCG/ACT inhaler Inhale into the lungs.    Marland Kitchen amoxicillin-clavulanate (AUGMENTIN) 875-125 MG tablet Take by mouth.    . lurasidone (LATUDA) 80 MG TABS tablet Take 1 tablet (80 mg total) by mouth daily with breakfast. 30 tablet 1  . azelastine (ASTELIN) 0.1 % nasal spray Place into the nose.    . calcitRIOL (ROCALTROL) 0.25 MCG capsule TAKE ONE CAPSULE BY MOUTH DAILY 90 capsule 1  . clonazePAM (KLONOPIN) 0.5 MG tablet Take 0.5-1 tablets (0.25-0.5 mg total) by mouth as directed. Take half to 1 tablet daily up to 2-3 times a week only for severe anxiety 12 tablet 1  . escitalopram (LEXAPRO) 10 MG tablet Take 1.5 tablets (15 mg total) by mouth daily. 45 tablet 1  . Eszopiclone 3 MG TABS Take 1 tablet (3 mg total) by mouth at bedtime. Take immediately before bedtime 30 tablet 1  . fenofibrate 160 MG tablet Take by mouth.    . fluticasone (FLONASE) 50 MCG/ACT nasal spray     . gabapentin (NEURONTIN) 300 MG capsule Take 300 mg by mouth. 2 CAP AM, 1 CAP Q noon, 2 CAP pm,    . hydrOXYzine (VISTARIL) 50 MG  capsule Take 1 capsule (50 mg total) by mouth 2 (two) times daily as needed for anxiety. 60 capsule 1  . lamoTRIgine (LAMICTAL) 100 MG tablet TAKE 1/2 TABLET BY MOUTH TWICE A DAY 90 tablet 0  . levothyroxine (SYNTHROID) 75 MCG tablet TAKE ONE TABLET BY MOUTH DAILY BEFORE BREAKFAST 90 tablet 1  . lisinopril (ZESTRIL) 10 MG tablet Take 10 mg by mouth daily.    Marland Kitchen lovastatin (MEVACOR) 20 MG tablet Take 20 mg by mouth at bedtime.    . ondansetron (ZOFRAN-ODT) 4 MG disintegrating tablet     . pantoprazole (PROTONIX) 40 MG tablet Take 1 tablet (40 mg total) by mouth daily. 30 tablet 1  . potassium chloride SA (KLOR-CON M20) 20 MEQ tablet Take 1 tablet (20 mEq total) by mouth 2 (two) times daily. 30 tablet 0  . propranolol (INNOPRAN XL) 80 MG 24 hr capsule Take by mouth.    . propranolol ER (INDERAL LA) 80 MG 24 hr capsule     . vitamin B-12 (CYANOCOBALAMIN) 1000 MCG tablet Take by mouth.      No current facility-administered medications for this visit.     Musculoskeletal: Strength & Muscle Tone: UTA Gait & Station: UTA Patient leans: N/A  Psychiatric Specialty Exam: Review of Systems  Psychiatric/Behavioral: Positive for dysphoric mood. The patient is nervous/anxious.   All other systems reviewed and are negative.   There were no vitals taken for this visit.There is no height or weight on file to calculate BMI.  General Appearance: Casual  Eye Contact:  Fair  Speech:  Normal Rate  Volume:  Normal  Mood:  Anxious and Depressed  Affect:  Congruent  Thought Process:  Goal Directed and Descriptions of Associations: Intact  Orientation:  Full (Time, Place, and Person)  Thought Content: Logical   Suicidal Thoughts:  No  Homicidal Thoughts:  No  Memory:  Immediate;   Fair Recent;   Fair Remote;   Fair  Judgement:  Fair  Insight:  Fair  Psychomotor Activity:  Normal  Concentration:  Concentration: Fair and Attention Span: Fair  Recall:  [employer] of Knowledge: Fair  Language: Fair   Akathisia:  No  Handed:  Right  AIMS (if indicated): UTA  Assets:  Communication Skills Desire for Improvement Housing Social Support  ADL's:  Intact  Cognition: WNL  Sleep:  Poor   Screenings: PHQ2-9   Flowsheet Row Video Visit from 10/13/2020 in [place] Video Visit from 09/20/2020 in [place]  PHQ-2 Total Score 5 4  PHQ-9 Total Score 20 --    Flowsheet Row Video Visit from 09/20/2020 in [place] No Risk       Assessment and Plan: Kaylee Gordon is a 60 year old Caucasian female, married, lives in [place], has a history of bipolar disorder, anxiety disorder, fibromyalgia, IBS was evaluated by telemedicine today.  Patient is currently struggling with depressive symptoms.  She will benefit from the following plan. The patient demonstrates the following risk factors for suicide: Chronic risk factors for suicide include: psychiatric disorder of bipolar disorder, anxiety and chronic pain. Acute risk factors for suicide include: family or marital conflict. Protective factors for this patient include: positive social support, positive therapeutic relationship, coping skills and hope for the future. Considering these factors, the overall suicide risk at this point appears to be low. Patient is appropriate for outpatient follow up.    Plan Bipolar disorder-unstable Increase Latuda to 80 mg p.o. daily with supper Lamictal 100 mg p.o. daily in divided dosage Lexapro 15 mg p.o. daily Klonopin 0.5 mg daily as needed for severe anxiety attacks.  She is limiting use Lunesta 3 mg p.o. nightly for sleep Hydroxyzine 50 mg p.o. twice daily as needed for anxiety attacks only. Refer patient for [place].  I have sent referral to [facility].  GAD-improving Continue CBT with Ms. Miguel Dibble Lexapro as prescribed Klonopin 0.25-0.5 mg p.o. daily as needed for anxiety  attacks.  Insomnia-unstable Gabapentin as prescribed for fibromyalgia.   Continue Lunesta as prescribed Discussed referral for sleep study, patient wants to wait.  I have also communicated with therapist-Ms. Miguel Dibble regarding patient's treatment plan.  Patient to continue psychotherapy sessions according to [facility] and will benefit from [place].  Follow-up in clinic in 3 to 4 weeks or sooner if needed.  I have spent atleast 30 minutes face to face with patient today which includes the time spent for preparing to see the patient ( e.g., review of test, records ), obtaining and to review and separately obtained history , ordering medications and test ,psychoeducation and supportive psychotherapy and care coordination,as well as documenting clinical information in electronic health record.   This note was generated in part or whole with voice recognition software. Voice recognition is usually quite accurate but there are transcription errors that can and very often do occur. I apologize for any typographical errors that were not detected and corrected.         Ursula Alert, MD 10/14/2020, 7:02 PM

## 2020-10-22 ENCOUNTER — Other Ambulatory Visit: Payer: Self-pay | Admitting: Psychiatry

## 2020-10-22 DIAGNOSIS — F3130 Bipolar disorder, current episode depressed, mild or moderate severity, unspecified: Secondary | ICD-10-CM

## 2020-10-25 ENCOUNTER — Telehealth: Payer: Self-pay | Admitting: Psychiatry

## 2020-10-25 ENCOUNTER — Telehealth (HOSPITAL_COMMUNITY): Payer: Self-pay

## 2020-10-25 NOTE — Telephone Encounter (Signed)
Spoke to patient about her recent depressive symptoms, self-injurious behaviors as communicated by her therapist Ms. Miguel Dibble.  Patient today reports she had a bad day when she felt like that however currently denies suicidality.  She however reports she is willing to get help and is interested in [place].  I have sent a referral to [facility].  Patient advised to be compliant.  Crisis plan discussed with patient

## 2020-10-25 NOTE — Telephone Encounter (Signed)
Pt called to cancel her TMS consult today with Dr. Dwyane Dee. Pt states that she would like to go to a closer location in [place].

## 2020-10-27 ENCOUNTER — Other Ambulatory Visit: Payer: Self-pay

## 2020-10-27 ENCOUNTER — Telehealth (INDEPENDENT_AMBULATORY_CARE_PROVIDER_SITE_OTHER): Payer: Medicare PPO | Admitting: Psychiatry

## 2020-10-27 ENCOUNTER — Encounter: Payer: Self-pay | Admitting: Psychiatry

## 2020-10-27 DIAGNOSIS — F3130 Bipolar disorder, current episode depressed, mild or moderate severity, unspecified: Secondary | ICD-10-CM | POA: Diagnosis not present

## 2020-10-27 DIAGNOSIS — G4701 Insomnia due to medical condition: Secondary | ICD-10-CM | POA: Diagnosis not present

## 2020-10-27 DIAGNOSIS — F411 Generalized anxiety disorder: Secondary | ICD-10-CM | POA: Diagnosis not present

## 2020-10-27 MED ORDER — PRAZOSIN HCL 1 MG PO CAPS: 1.0000 mg | ORAL_CAPSULE | Freq: Every day | ORAL | 1 refills | Status: DC

## 2020-10-27 NOTE — Progress Notes (Signed)
This note is not being shared with the patient for the following reason: To prevent harm (release of this note would result in harm to the life or physical safety of the patient or another).   Virtual Visit via Video Note  I connected with Kaylee Gordon on 10/27/20 at  9:00 AM EDT by a video enabled telemedicine application and verified that I am speaking with the correct person using two identifiers.  Location Provider Location : [facility] Patient Location : Home  Participants: Patient , Provider   I discussed the limitations of evaluation and management by telemedicine and the availability of in person appointments. The patient expressed understanding and agreed to proceed.   I discussed the assessment and treatment plan with the patient. The patient was provided an opportunity to ask questions and all were answered. The patient agreed with the plan and demonstrated an understanding of the instructions.   The patient was advised to call back or seek an in-person evaluation if the symptoms worsen or if the condition fails to improve as anticipated.   [place] MD OP Progress Note  10/27/2020 12:31 PM [name]  MRN:  [id number]  Chief Complaint:  Chief Complaint    Follow-up; Depression     HPI: Kaylee Gordon is a 60 year old Caucasian female who has a history of bipolar disorder, GAD, fibromyalgia, married, lives in [place] was evaluated by telemedicine today.  Patient today reports she continues to struggle with sadness, low energy, lack of motivation, anhedonia, sleep problems.  She reports she has a lot of low self-esteem and feels as though she is a burden on everyone all the time.  She does report chronic passive suicidal thoughts all the time.  It comes and goes.  She however denies any active suicidal intent or active plans at this time.  Patient reports she continues to have vivid dreams at night which does have an impact on her sleep.  Patient reports the Latuda  increased dosage has helped to some extent.  Patient denies side effects to medications.  Patient denies any homicidality or perceptual disturbances.  Patient does report she may have used Valium from an old prescription ,when she may have used more than what was prescribed when she felt extremely anxious and wanted to just sleep.  Patient reports she continues to be in therapy with her therapist Ms. Miguel Dibble.  Patient also reports she did receive a call from [facility] that she was referred to by writer and agrees to give them a call back today.  She reports good support system from her son who is there to help her if she needs to go to the emergency department or needs more help with her mental health.  She reports she also can talk to her husband who is home.  Patient denies any other concerns today.  Visit Diagnosis:    ICD-10-CM   1. Bipolar I disorder, most recent episode depressed ([place])  F31.30    severe  2. GAD (generalized anxiety disorder)  F41.1   3. Insomnia due to medical condition  G47.01 prazosin (MINIPRESS) 1 MG capsule    Past Psychiatric History: I have reviewed past psychiatric history from my progress note on 09/11/2018  Past Medical History:  Past Medical History:  Diagnosis Date  . Anxiety   . Asthma   . B12 deficiency   . Cancer [facility])    SKIN CANCER  . CHF (congestive heart failure) ([place])   . Depression   .  Hypertension   . Thyroid disease     Past Surgical History:  Procedure Laterality Date  . ANKLE SURGERY Right 2012  . APPENDECTOMY    . AUGMENTATION MAMMAPLASTY Bilateral   . BREAST ENHANCEMENT SURGERY    . COLONOSCOPY WITH PROPOFOL N/A 02/22/2015   Procedure: COLONOSCOPY WITH PROPOFOL;  Surgeon: Josefine Class, MD;  Location[facility] ENDOSCOPY;  Service: Endoscopy;  Laterality: N/A;  . ESOPHAGOGASTRODUODENOSCOPY (EGD) WITH PROPOFOL N/A 02/22/2015   Procedure: ESOPHAGOGASTRODUODENOSCOPY (EGD) WITH PROPOFOL;  Surgeon: Josefine Class,  MD;  Location: [facility] ENDOSCOPY;  Service: Endoscopy;  Laterality: N/A;  . ESOPHAGOGASTRODUODENOSCOPY (EGD) WITH PROPOFOL N/A 02/11/2016   Procedure: ESOPHAGOGASTRODUODENOSCOPY (EGD) WITH PROPOFOL;  Surgeon: Manya Silvas, MD;  Location: [facility] ENDOSCOPY;  Service: Endoscopy;  Laterality: N/A;  . HEMORRHOID SURGERY    . NASAL SINUS SURGERY    . SAVORY DILATION N/A 02/22/2015   Procedure: SAVORY DILATION;  Surgeon: Josefine Class, MD;  Location: [facility] ENDOSCOPY;  Service: Endoscopy;  Laterality: N/A;  . SAVORY DILATION N/A 02/11/2016   Procedure: SAVORY DILATION;  Surgeon: Manya Silvas, MD;  Location[facility] ENDOSCOPY;  Service: Endoscopy;  Laterality: N/A;  . SKIN CANCER EXCISION      Family Psychiatric History: Reviewed family psychiatric history from my progress note on 09/11/2018  Family History:  Family History  Problem Relation Age of Onset  . Cancer Mother        colon cancer  . Depression Mother   . Anxiety disorder Mother   . Heart disease Father   . Cancer Father        ? neck cancer- still living.   . Anxiety disorder Father   . Depression Father   . Hypoparathyroidism Son   . Colon cancer Maternal Aunt        died in 62s.     Social History: I have reviewed social history from my progress note on 09/11/2018 Social History   Socioeconomic History  . Marital status: Married    Spouse name: Legrand Como  . Number of children: Not on file  . Years of education: Not on file  . Highest education level: Not on file  Occupational History  . Not on file  Tobacco Use  . Smoking status: Never Smoker  . Smokeless tobacco: Never Used  Vaping Use  . Vaping Use: Not on file  Substance and Sexual Activity  . Alcohol use: No    Alcohol/week: 0.0 standard drinks    Comment: MAYBE ONCE OR TWICE A MONTH  . Drug use: No  . Sexual activity: Not Currently    Partners: Male  Other Topics Concern  . Not on file  Social History Narrative   Lives in [place]; taught high school-  math; retd; no smoking; social alcohol.    Social Determinants of Health   Financial Resource Strain: Not on file  Food Insecurity: Not on file  Transportation Needs: Not on file  Physical Activity: Not on file  Stress: Not on file  Social Connections: Not on file    Allergies:  Allergies  Allergen Reactions  . Levofloxacin Other (See Comments) and Nausea Only    Lethargic, Flu-like symptoms, Hot flashes   . Atorvastatin   . Hydrochlorothiazide Other (See Comments)    Intolerance - dry lips  . Parafon Forte Dsc [Chlorzoxazone]   . Pollen Extract   . Sudafed [Pseudoephedrine Hcl]     Heart racing    Metabolic Disorder Labs: No results found for: HGBA1C, MPG Lab  Results  Component Value Date   PROLACTIN 18.3 07/22/2014   No results found for: CHOL, TRIG, HDL, CHOLHDL, VLDL, LDLCALC Lab Results  Component Value Date   TSH 1.01 08/05/2020   TSH 1.14 04/05/2020    Therapeutic Level Labs: No results found for: LITHIUM Lab Results  Component Value Date   VALPROATE 39 (L) 02/04/2019   No components found for:  CBMZ  Current Medications: Current Outpatient Medications  Medication Sig Dispense Refill  . prazosin (MINIPRESS) 1 MG capsule Take 1 capsule (1 mg total) by mouth at bedtime. 30 capsule 1  . albuterol (VENTOLIN HFA) 108 (90 Base) MCG/ACT inhaler Inhale into the lungs.    Marland Kitchen azelastine (ASTELIN) 0.1 % nasal spray Place into the nose.    . calcitRIOL (ROCALTROL) 0.25 MCG capsule TAKE ONE CAPSULE BY MOUTH DAILY 90 capsule 1  . escitalopram (LEXAPRO) 10 MG tablet Take 1.5 tablets (15 mg total) by mouth daily. 45 tablet 1  . Eszopiclone 3 MG TABS Take 1 tablet (3 mg total) by mouth at bedtime. Take immediately before bedtime 30 tablet 1  . fenofibrate 160 MG tablet Take by mouth.    . fluticasone (FLONASE) 50 MCG/ACT nasal spray     . gabapentin (NEURONTIN) 300 MG capsule Take 300 mg by mouth. 2 CAP AM, 1 CAP Q noon, 2 CAP pm,    . hydrOXYzine (VISTARIL) 50 MG  capsule Take 1 capsule (50 mg total) by mouth 2 (two) times daily as needed for anxiety. 60 capsule 1  . lamoTRIgine (LAMICTAL) 100 MG tablet TAKE 1/2 TABLET BY MOUTH TWICE A DAY 90 tablet 0  . levothyroxine (SYNTHROID) 75 MCG tablet TAKE ONE TABLET BY MOUTH DAILY BEFORE BREAKFAST 90 tablet 1  . lisinopril (ZESTRIL) 10 MG tablet Take 10 mg by mouth daily.    Marland Kitchen lovastatin (MEVACOR) 20 MG tablet Take 20 mg by mouth at bedtime.    Marland Kitchen lurasidone (LATUDA) 80 MG TABS tablet Take 1 tablet (80 mg total) by mouth daily with breakfast. 30 tablet 1  . ondansetron (ZOFRAN-ODT) 4 MG disintegrating tablet     . pantoprazole (PROTONIX) 40 MG tablet Take 1 tablet (40 mg total) by mouth daily. 30 tablet 1  . potassium chloride SA (KLOR-CON M20) 20 MEQ tablet Take 1 tablet (20 mEq total) by mouth 2 (two) times daily. 30 tablet 0  . propranolol (INNOPRAN XL) 80 MG 24 hr capsule Take by mouth.    . propranolol ER (INDERAL LA) 80 MG 24 hr capsule     . vitamin B-12 (CYANOCOBALAMIN) 1000 MCG tablet Take by mouth.      No current facility-administered medications for this visit.     Musculoskeletal: Strength & Muscle Tone: UTA Gait & Station: normal Patient leans: N/A  Psychiatric Specialty Exam: Review of Systems  Psychiatric/Behavioral: Positive for dysphoric mood and sleep disturbance. The patient is nervous/anxious.   All other systems reviewed and are negative.   There were no vitals taken for this visit.There is no height or weight on file to calculate BMI.  General Appearance: Casual  Eye Contact:  Fair  Speech:  Clear and Coherent  Volume:  Normal  Mood:  Anxious and Depressed  Affect:  Congruent  Thought Process:  Goal Directed and Descriptions of Associations: Intact  Orientation:  Full (Time, Place, and Person)  Thought Content: Logical   Suicidal Thoughts:  Reports chronic suicidal thoughts, hopelessness-currently denies any active intent or  plan, reports she always feels she is a burden  Homicidal Thoughts:  No  Memory:  Immediate;   Fair Recent;   Fair Remote;   Fair  Judgement:  Fair  Insight:  Fair  Psychomotor Activity:  Normal  Concentration:  Concentration: Fair and Attention Span: Fair  Recall:  [employer] of Knowledge: Fair  Language: Fair  Akathisia:  No  Handed:  Right  AIMS (if indicated): UTA  Assets:  Communication Skills Desire for Improvement Housing Social Support  ADL's:  Intact  Cognition: WNL  Sleep:  Improving   Screenings: PHQ2-9   Flowsheet Row Video Visit from 10/13/2020 in [facility] Video Visit from 09/20/2020 in [place] [facility]  PHQ-2 Total Score 5 4  PHQ-9 Total Score 20 --    Flowsheet Row Video Visit from 09/20/2020 in [facility] No Risk       Assessment and Plan: ASHLAY ALTIERI is a 60 year old Caucasian female, married, lives in [place], has a history of bipolar disorder, anxiety disorder, fibromyalgia, IBS was evaluated by telemedicine today.  Patient is currently struggling with depression and anxiety symptoms and will benefit from following plan. The patient demonstrates the following risk factors for suicide: Chronic risk factors for suicide include: psychiatric disorder of bipolar , anxiety and chronic pain. Acute risk factors for suicide include: family or marital conflict. Protective factors for this patient include: positive social support, positive therapeutic relationship and coping skills. Considering these factors, the overall suicide risk at this point appears to be low. Patient is appropriate for outpatient follow up.  Plan Bipolar disorder-unstable Continue Latuda 80 mg p.o. daily with supper Lamictal 100 mg p.o. daily divided dosage Lexapro 15 mg p.o. daily Lunesta 3 mg p.o. nightly for sleep Hydroxyzine 50 mg p.o. twice daily as needed for anxiety attacks only Discontinue Klonopin-patient reports  misusing or overusing Valium from a previous prescription when she felt extremely depressed-reports she just wanted to sleep and not feel anything.  I will not recommend benzodiazepines for this patient at this time. Patient has been referred for TMS-she agrees to give them a call-[place] [facility]  GAD-some progress Continue CBT with Ms. Miguel Dibble Lexapro 15 mg p.o. daily   Insomnia-unstable Add prazosin 1 mg p.o. nightly Continue Lunesta 3 mg p.o. nightly  I have communicated with her son-Jesse-safety plan discussed with patient as well as son.  Son agrees to remove sharps from the home and also get her help if she needs help.  I have communicated with Ms. Tina Thompson-discussed treatment plan.  Provided patient information for [facility] also discussed going to the nearest emergency department if her symptoms worsen.  Follow-up in clinic in 2 weeks or sooner if needed.  This note was generated in part or whole with voice recognition software. Voice recognition is usually quite accurate but there are transcription errors that can and very often do occur. I apologize for any typographical errors that were not detected and corrected.       Ursula Alert, MD 10/27/2020, 12:31 PM

## 2020-10-27 NOTE — Patient Instructions (Signed)
Prazosin Oral Capsules What is this medicine? PRAZOSIN (PRA zoe sin) is an alpha blocker. It treats high blood pressure. This medicine may be used for other purposes; ask your health care provider or pharmacist if you have questions. COMMON BRAND NAME(S): Minipress What should I tell my health care provider before I take this medicine? They need to know if you have any of the following conditions:  kidney disease  an unusual or allergic reaction to prazosin, other medicines, foods, dyes, or preservatives  pregnant or trying to get pregnant  breast-feeding How should I use this medicine? Take this drug by mouth. Take it as directed on the prescription label at the same time every day. Keep taking it unless your health care provider tells you to stop. Talk to your health care provider about the use of this drug in children. Special care may be needed. Overdosage: If you think you have taken too much of this medicine contact a poison control center or emergency room at once. NOTE: This medicine is only for you. Do not share this medicine with others. What if I miss a dose? If you miss a dose, take it as soon as you can. If it is almost time for your next dose, take only that dose. Do not take double or extra doses. What may interact with this medicine?  diuretics  medicines for high blood pressure  sildenafil  tadalafil  vardenafil This list may not describe all possible interactions. Give your health care provider a list of all the medicines, herbs, non-prescription drugs, or dietary supplements you use. Also tell them if you smoke, drink alcohol, or use illegal drugs. Some items may interact with your medicine. What should I watch for while using this medicine? Visit your doctor or health care professional for regular checks on your progress. Check your blood pressure regularly. Ask your doctor or health care professional what your blood pressure should be and when you should  contact him or her. Drowsiness and dizziness are more likely to occur after the first dose, after an increase in dose, or during hot weather or exercise. These effects can decrease once your body adjusts to this medicine. Do not drive, use machinery, or do anything that needs mental alertness until you know how this drug affects you. Do not stand or sit up quickly, especially if you are an older patient. This reduces the risk of dizzy or fainting spells. Alcohol can make you more drowsy and dizzy. Avoid alcoholic drinks. Do not treat yourself for coughs, colds or allergies without asking your doctor or health care professional for advice. Some ingredients can increase your blood pressure. Your mouth may get dry. Chewing sugarless gum or sucking hard candy, and drinking plenty of water may help. Contact your doctor if the problem does not go away or is severe. For males, contact your doctor or health care professional right away if you have an erection that lasts longer than 4 hours or if it becomes painful. This may be a sign of a serious problem and must be treated right away to prevent permanent damage. What side effects may I notice from receiving this medicine? Side effects that you should report to your doctor or health care professional as soon as possible:  blurred vision  difficulty breathing, shortness of breath  fainting spells, light headedness  fast or irregular heartbeat, palpitations or chest pain  numbness in hands or feet  prolonged painful erection of the penis  swelling of the legs or  ankles  unusually weak or tired Side effects that usually do not require medical attention (report to your doctor or health care professional if they continue or are bothersome):  constipation or diarrhea  headache  nausea  sexual difficulties  stomach pain This list may not describe all possible side effects. Call your doctor for medical advice about side effects. You may report side  effects to FDA at 1-800-FDA-1088. Where should I keep my medicine? Keep out of the reach of children and pets. Store at room temperature between 15 and 30 degrees C (59 and 86 degrees F). Throw away any unused drug after the expiration date. NOTE: This sheet is a summary. It may not cover all possible information. If you have questions about this medicine, talk to your doctor, pharmacist, or health care provider.  2021 Elsevier/Gold Standard (2019-03-11 20:15:05)

## 2020-10-28 ENCOUNTER — Other Ambulatory Visit: Payer: Self-pay | Admitting: Psychiatry

## 2020-10-28 DIAGNOSIS — F3130 Bipolar disorder, current episode depressed, mild or moderate severity, unspecified: Secondary | ICD-10-CM

## 2020-11-12 ENCOUNTER — Telehealth: Payer: Medicare PPO | Admitting: Psychiatry

## 2020-11-15 ENCOUNTER — Other Ambulatory Visit: Payer: Self-pay | Admitting: *Deleted

## 2020-11-15 ENCOUNTER — Telehealth: Payer: Self-pay

## 2020-11-15 MED ORDER — CALCITRIOL 0.25 MCG PO CAPS: 0.2500 ug | ORAL_CAPSULE | Freq: Every day | ORAL | 1 refills | Status: DC

## 2020-11-15 NOTE — Telephone Encounter (Signed)
pt called left a messages that she needed a refill on ADHD medications.

## 2020-11-16 NOTE — Telephone Encounter (Signed)
Returned call to patient.  Left voicemail to clarify what medication refill she needs since she is not on any ADHD medication.

## 2020-11-17 NOTE — Telephone Encounter (Signed)
pt called she states she returning your call. pt states she should be good on her medications until her appt

## 2020-11-18 ENCOUNTER — Other Ambulatory Visit: Payer: Self-pay

## 2020-11-18 ENCOUNTER — Encounter: Payer: Self-pay | Admitting: Psychiatry

## 2020-11-18 ENCOUNTER — Telehealth (INDEPENDENT_AMBULATORY_CARE_PROVIDER_SITE_OTHER): Payer: Medicare PPO | Admitting: Psychiatry

## 2020-11-18 DIAGNOSIS — G4701 Insomnia due to medical condition: Secondary | ICD-10-CM

## 2020-11-18 DIAGNOSIS — F3130 Bipolar disorder, current episode depressed, mild or moderate severity, unspecified: Secondary | ICD-10-CM | POA: Diagnosis not present

## 2020-11-18 DIAGNOSIS — F411 Generalized anxiety disorder: Secondary | ICD-10-CM

## 2020-11-18 MED ORDER — PRAZOSIN HCL 2 MG PO CAPS: 2.0000 mg | ORAL_CAPSULE | Freq: Every day | ORAL | 1 refills | Status: DC

## 2020-11-18 MED ORDER — LAMOTRIGINE 25 MG PO TABS: 25.0000 mg | ORAL_TABLET | Freq: Every day | ORAL | 1 refills | Status: DC

## 2020-11-18 NOTE — Progress Notes (Signed)
Virtual Visit via Video Note  I connected with Kaylee Gordon on 11/18/20 at 10:20 AM EDT by a video enabled telemedicine application and verified that I am speaking with the correct person using two identifiers.  Location Provider Location : [facility] Patient Location : Home  Participants: Patient , Provider    I discussed the limitations of evaluation and management by telemedicine and the availability of in person appointments. The patient expressed understanding and agreed to proceed.    I discussed the assessment and treatment plan with the patient. The patient was provided an opportunity to ask questions and all were answered. The patient agreed with the plan and demonstrated an understanding of the instructions.   The patient was advised to call back or seek an in-person evaluation if the symptoms worsen or if the condition fails to improve as anticipated.   [facility] MD OP Progress Note  11/18/2020 11:28 AM Kaylee Gordon  MRN:  [id number]  Chief Complaint:  Chief Complaint    Follow-up; Depression; Anxiety     HPI: Kaylee Gordon is a 60 year old Caucasian female who has a history of bipolar disorder, GAD, fibromyalgia, married, lives in [place], was evaluated by telemedicine today.  Patient today reports she is currently struggling with racing thoughts, anxiety symptoms as well as sleep problems.  She reports the prazosin has helped to some extent and her nightmares have improved.  Her racing thoughts comes and goes.  Some nights she is okay.  Patient continues to struggle with lack of motivation, low energy, anhedonia.  She reports she was evaluated for [place] and has upcoming follow-up appointment.  Patient continues to have chronic self-injurious thoughts or death wish.  She reports few days ago she felt like everyone else would be better off without her however she did not linger on these thoughts and reports she was able to distract herself.  She does have support system  from her son who lives in the same house.  She also has psychotherapy appointments with Ms. Miguel Dibble and reports when she has these thoughts her support system and resources help her.  She reports she will never do anything to hurt herself since she does not want to hurt her family.  Patient currently denies any suicidality or homicidality.  Patient does report having anxiety attacks when she feels as though she has racing heart rate and feels overwhelmed.  She is currently taking Lexapro for anxiety.  She does not know how much it is helping.  Patient denies side effects to medications.  Patient denies any other concerns today.  Visit Diagnosis:    ICD-10-CM   1. Bipolar I disorder, most recent episode depressed (HCC)  F31.30 lamoTRIgine (LAMICTAL) 25 MG tablet  2. GAD (generalized anxiety disorder)  F41.1 lamoTRIgine (LAMICTAL) 25 MG tablet  3. Insomnia due to medical condition  G47.01 prazosin (MINIPRESS) 2 MG capsule   pain, anxiety    Past Psychiatric History: I have reviewed past psychiatric history from progress note on 09/11/2018  Past Medical History:  Past Medical History:  Diagnosis Date  . Anxiety   . Asthma   . B12 deficiency   . Cancer [facility])    SKIN CANCER  . CHF (congestive heart failure) ([place])   . Depression   . Hypertension   . Thyroid disease     Past Surgical History:  Procedure Laterality Date  . ANKLE SURGERY Right 2012  . APPENDECTOMY    . AUGMENTATION MAMMAPLASTY Bilateral   . BREAST ENHANCEMENT  SURGERY    . COLONOSCOPY WITH PROPOFOL N/A 02/22/2015   Procedure: COLONOSCOPY WITH PROPOFOL;  Surgeon: Josefine Class, MD;  Location: [facility] ENDOSCOPY;  Service: Endoscopy;  Laterality: N/A;  . ESOPHAGOGASTRODUODENOSCOPY (EGD) WITH PROPOFOL N/A 02/22/2015   Procedure: ESOPHAGOGASTRODUODENOSCOPY (EGD) WITH PROPOFOL;  Surgeon: Josefine Class, MD;  Location: [facility] ENDOSCOPY;  Service: Endoscopy;  Laterality: N/A;  . ESOPHAGOGASTRODUODENOSCOPY (EGD) WITH  PROPOFOL N/A 02/11/2016   Procedure: ESOPHAGOGASTRODUODENOSCOPY (EGD) WITH PROPOFOL;  Surgeon: Manya Silvas, MD;  Location: [facility] ENDOSCOPY;  Service: Endoscopy;  Laterality: N/A;  . HEMORRHOID SURGERY    . NASAL SINUS SURGERY    . SAVORY DILATION N/A 02/22/2015   Procedure: SAVORY DILATION;  Surgeon: Josefine Class, MD;  Location: [facility] ENDOSCOPY;  Service: Endoscopy;  Laterality: N/A;  . SAVORY DILATION N/A 02/11/2016   Procedure: SAVORY DILATION;  Surgeon: Manya Silvas, MD;  Location: [facility] ENDOSCOPY;  Service: Endoscopy;  Laterality: N/A;  . SKIN CANCER EXCISION      Family Psychiatric History: Reviewed family psychiatric history from progress note on 09/11/2018  Family History:  Family History  Problem Relation Age of Onset  . Cancer Mother        colon cancer  . Depression Mother   . Anxiety disorder Mother   . Heart disease Father   . Cancer Father        ? neck cancer- still living.   . Anxiety disorder Father   . Depression Father   . Hypoparathyroidism Son   . Colon cancer Maternal Aunt        died in 55s.     Social History: Reviewed social history from progress note on 09/11/2018 Social History   Socioeconomic History  . Marital status: Married    Spouse name: Legrand Como  . Number of children: Not on file  . Years of education: Not on file  . Highest education level: Not on file  Occupational History  . Not on file  Tobacco Use  . Smoking status: Never Smoker  . Smokeless tobacco: Never Used  Vaping Use  . Vaping Use: Not on file  Substance and Sexual Activity  . Alcohol use: No    Alcohol/week: 0.0 standard drinks    Comment: MAYBE ONCE OR TWICE A MONTH  . Drug use: No  . Sexual activity: Not Currently    Partners: Male  Other Topics Concern  . Not on file  Social History Narrative   Lives in [place]; taught high school- math; retd; no smoking; social alcohol.    Social Determinants of Health   Financial Resource Strain: Not on file  Food  Insecurity: Not on file  Transportation Needs: Not on file  Physical Activity: Not on file  Stress: Not on file  Social Connections: Not on file    Allergies:  Allergies  Allergen Reactions  . Levofloxacin Other (See Comments) and Nausea Only    Lethargic, Flu-like symptoms, Hot flashes   . Atorvastatin   . Hydrochlorothiazide Other (See Comments)    Intolerance - dry lips  . Parafon Forte Dsc [Chlorzoxazone]   . Pollen Extract   . Sudafed [Pseudoephedrine Hcl]     Heart racing    Metabolic Disorder Labs: No results found for: HGBA1C, MPG Lab Results  Component Value Date   PROLACTIN 18.3 07/22/2014   No results found for: CHOL, TRIG, HDL, CHOLHDL, VLDL, LDLCALC Lab Results  Component Value Date   TSH 1.01 08/05/2020   TSH 1.14 04/05/2020    Therapeutic  Level Labs: No results found for: LITHIUM Lab Results  Component Value Date   VALPROATE 39 (L) 02/04/2019   No components found for:  CBMZ  Current Medications: Current Outpatient Medications  Medication Sig Dispense Refill  . lamoTRIgine (LAMICTAL) 25 MG tablet Take 1 tablet (25 mg total) by mouth daily. Take along with Lamictal 100 mg daily 30 tablet 1  . prazosin (MINIPRESS) 2 MG capsule Take 1 capsule (2 mg total) by mouth at bedtime. 30 capsule 1  . albuterol (VENTOLIN HFA) 108 (90 Base) MCG/ACT inhaler Inhale into the lungs.    Marland Kitchen azelastine (ASTELIN) 0.1 % nasal spray Place into the nose.    . calcitRIOL (ROCALTROL) 0.25 MCG capsule Take 1 capsule (0.25 mcg total) by mouth daily. 90 capsule 1  . escitalopram (LEXAPRO) 10 MG tablet Take 1.5 tablets (15 mg total) by mouth daily. 45 tablet 1  . Eszopiclone 3 MG TABS Take 1 tablet (3 mg total) by mouth at bedtime. Take immediately before bedtime 30 tablet 1  . fenofibrate 160 MG tablet Take by mouth.    . fluticasone (FLONASE) 50 MCG/ACT nasal spray     . gabapentin (NEURONTIN) 300 MG capsule Take 300 mg by mouth. 2 CAP AM, 1 CAP Q noon, 2 CAP pm,    .  HYDROcodone bit-homatropine (HYCODAN) 5-1.5 MG/5ML syrup hydrocodone-homatropine 5 mg-1.5 mg/5 mL oral syrup    . hydrOXYzine (VISTARIL) 50 MG capsule Take 1 capsule (50 mg total) by mouth 2 (two) times daily as needed for anxiety. 60 capsule 1  . lamoTRIgine (LAMICTAL) 100 MG tablet TAKE 1/2 TABLET BY MOUTH TWICE A DAY 90 tablet 0  . levothyroxine (SYNTHROID) 75 MCG tablet TAKE ONE TABLET BY MOUTH DAILY BEFORE BREAKFAST 90 tablet 1  . lisinopril (ZESTRIL) 10 MG tablet Take 10 mg by mouth daily.    Marland Kitchen lovastatin (MEVACOR) 20 MG tablet Take 20 mg by mouth at bedtime.    Marland Kitchen lurasidone (LATUDA) 80 MG TABS tablet Take 1 tablet (80 mg total) by mouth daily with breakfast. 30 tablet 1  . ondansetron (ZOFRAN-ODT) 4 MG disintegrating tablet     . pantoprazole (PROTONIX) 40 MG tablet Take 1 tablet (40 mg total) by mouth daily. 30 tablet 1  . propranolol (INNOPRAN XL) 80 MG 24 hr capsule Take by mouth.    . propranolol ER (INDERAL LA) 80 MG 24 hr capsule     . vitamin B-12 (CYANOCOBALAMIN) 1000 MCG tablet Take by mouth.      No current facility-administered medications for this visit.     Musculoskeletal: Strength & Muscle Tone: UTA Gait & Station: UTA Patient leans: N/A  Psychiatric Specialty Exam: Review of Systems  Musculoskeletal: Positive for myalgias.  Psychiatric/Behavioral: Positive for decreased concentration, dysphoric mood and sleep disturbance. The patient is nervous/anxious.   All other systems reviewed and are negative.   There were no vitals taken for this visit.There is no height or weight on file to calculate BMI.  General Appearance: Casual  Eye Contact:  Fair  Speech:  Normal Rate  Volume:  Normal  Mood:  Anxious and Depressed  Affect:  Congruent  Thought Process:  Goal Directed and Descriptions of Associations: Intact  Orientation:  Full (Time, Place, and Person)  Thought Content: Logical   Suicidal Thoughts:  No  Homicidal Thoughts:  No  Memory:  Immediate;    Fair Recent;   Fair Remote;   Fair  Judgement:  Fair  Insight:  Fair  Psychomotor Activity:  Normal  Concentration:  Concentration: Fair and Attention Span: Fair  Recall:  [employer] of Knowledge: Fair  Language: Fair  Akathisia:  No  Handed:  Right  AIMS (if indicated): UTA  Assets:  Communication Skills Desire for Improvement Housing Social Support  ADL's:  Intact  Cognition: WNL  Sleep:  Improving   Screenings: PHQ2-9   Flowsheet Row Video Visit from 10/13/2020 in [facility] Video Visit from 09/20/2020 in [facility]  PHQ-2 Total Score 5 4  PHQ-9 Total Score 20 --    Flowsheet Row Video Visit from 09/20/2020 in [facility] No Risk       Assessment and Plan: JAEANNA MCCOMBER is a 60 year old Caucasian female, married, lives in [place], has a history of bipolar disorder, anxiety disorder, fibromyalgia, IBS was evaluated by telemedicine today.  Patient is currently struggling with depression and anxiety, discussed plan as noted below.  Plan Bipolar disorder- unstable Increase Lamictal to 125 mg p.o. daily in divided dosage Continue Latuda 80 mg p.o. daily with supper Lexapro 15 mg p.o. daily Lunesta 3 mg p.o nightly Hydroxyzine 50 mg p.o. twice daily as needed for severe anxiety attacks only. Patient to continue therapy with Ms. Miguel Dibble.  GAD- unstable Continue CBT with Ms. Miguel Dibble Lexapro 15 mg p.o. daily Hydroxyzine 50 mg p.o. twice daily as needed for severe anxiety attacks Will not recommend benzodiazepines for this patient due to history of misusing or overusing. Lamictal will also help with anxiety.  Insomnia- improving Increase prazosin to 2 mg p.o. nightly Lunesta 3 mg p.o. nightly  I have communicated with Dr. Nicolasa Ducking regarding patient, patient is an appropriate candidate for [place].  I have communicated with Ms. Miguel Dibble regarding  patient's treatment plan.  Therapist will continue to work with patient on cognitive behavioral strategies as well as panic control and trauma focused therapy.  Follow-up in clinic in 4 weeks or sooner if needed.  This note was generated in part or whole with voice recognition software. Voice recognition is usually quite accurate but there are transcription errors that can and very often do occur. I apologize for any typographical errors that were not detected and corrected.     Ursula Alert, MD 11/19/2020, 9:20 AM

## 2020-12-09 ENCOUNTER — Other Ambulatory Visit: Payer: Self-pay | Admitting: Psychiatry

## 2020-12-09 DIAGNOSIS — F3130 Bipolar disorder, current episode depressed, mild or moderate severity, unspecified: Secondary | ICD-10-CM

## 2020-12-09 DIAGNOSIS — F411 Generalized anxiety disorder: Secondary | ICD-10-CM

## 2020-12-13 ENCOUNTER — Other Ambulatory Visit: Payer: Self-pay | Admitting: Psychiatry

## 2020-12-13 DIAGNOSIS — G4701 Insomnia due to medical condition: Secondary | ICD-10-CM

## 2020-12-15 ENCOUNTER — Encounter: Payer: Self-pay | Admitting: Psychiatry

## 2020-12-15 ENCOUNTER — Telehealth (INDEPENDENT_AMBULATORY_CARE_PROVIDER_SITE_OTHER): Payer: Medicare PPO | Admitting: Psychiatry

## 2020-12-15 ENCOUNTER — Other Ambulatory Visit: Payer: Self-pay

## 2020-12-15 DIAGNOSIS — G4701 Insomnia due to medical condition: Secondary | ICD-10-CM | POA: Diagnosis not present

## 2020-12-15 DIAGNOSIS — F3130 Bipolar disorder, current episode depressed, mild or moderate severity, unspecified: Secondary | ICD-10-CM

## 2020-12-15 DIAGNOSIS — F411 Generalized anxiety disorder: Secondary | ICD-10-CM

## 2020-12-15 MED ORDER — HYDROXYZINE PAMOATE 50 MG PO CAPS: 50.0000 mg | ORAL_CAPSULE | Freq: Two times a day (BID) | ORAL | 1 refills | Status: DC | PRN

## 2020-12-15 NOTE — Progress Notes (Signed)
Virtual Visit via Video Note  I connected with Kaylee Gordon on 12/15/20 at  1:00 PM EDT by a video enabled telemedicine application and verified that I am speaking with the correct person using two identifiers.  Location Provider Location : Office Patient Location : Home  Participants: Patient , Provider    I discussed the limitations of evaluation and management by telemedicine and the availability of in person appointments. The patient expressed understanding and agreed to proceed.   I discussed the assessment and treatment plan with the patient. The patient was provided an opportunity to ask questions and all were answered. The patient agreed with the plan and demonstrated an understanding of the instructions.   The patient was advised to call back or seek an in-person evaluation if the symptoms worsen or if the condition fails to improve as anticipated.   Kaylee Creek MD OP Progress Note  12/15/2020 2:09 PM Kaylee Gordon  MRN:  [id number]  Chief Complaint:  Chief Complaint    Follow-up; Anxiety; Depression     HPI: Kaylee Gordon is a 60 year old Caucasian female who has a history of bipolar disorder, GAD, fibromyalgia, married, lives in [place], was evaluated by telemedicine today.  Patient today reports she has started [place] therapy.  She had 5 treatments already.  She reports she does have a headache every day however it is manageable.  She has not noticed much benefit from it yet.  She however is aware that she needs to give it more time.  Patient reports depressive symptoms likely improving.  She is compliant on the Lamictal higher dosage.  Denies side effects.  She reports sleep is better.  The prazosin and the Lunesta helps.  She however does have sleep problems on and off, at least a few times a week more so because of her husband's snoring.  She agrees to work on sleep hygiene and maybe try to sleep in a different room if possible.  Patient denies any current active  suicidal thoughts or plan.  She however reports she does have episodes of feeling that everybody will be better off without her.  She does have a safety plan and reports she does have good social support and she can always reach out to them if she has any suicidal thoughts.  Her daughter is currently back at home for the summer and that helps her a lot.  She did have psychotherapy visit yesterday and that is going well.  Patient denies any other concerns today.  Visit Diagnosis:    ICD-10-CM   1. Bipolar I disorder, most recent episode depressed ([place])  F31.30   2. GAD (generalized anxiety disorder)  F41.1   3. 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 hydrOXYzine (VISTARIL) 50 MG capsule   anxiety    Past Psychiatric History: I have reviewed past psychiatric history from progress note on 09/11/2018  Past Medical History:  Past Medical History:  Diagnosis Date  . Anxiety   . Asthma   . B12 deficiency   . Cancer [facility])    SKIN CANCER  . CHF (congestive heart failure) ([place])   . Depression   . Hypertension   . Thyroid disease     Past Surgical History:  Procedure Laterality Date  . ANKLE SURGERY Right 2012  . APPENDECTOMY    . AUGMENTATION MAMMAPLASTY Bilateral   . BREAST ENHANCEMENT SURGERY    . COLONOSCOPY WITH PROPOFOL N/A 02/22/2015   Procedure: COLONOSCOPY WITH PROPOFOL;  Surgeon: Josefine Class, MD;  Location: [facility] ENDOSCOPY;  Service: Endoscopy;  Laterality: N/A;  . ESOPHAGOGASTRODUODENOSCOPY (EGD) WITH PROPOFOL N/A 02/22/2015   Procedure: ESOPHAGOGASTRODUODENOSCOPY (EGD) WITH PROPOFOL;  Surgeon: Josefine Class, MD;  Location: [facility] ENDOSCOPY;  Service: Endoscopy;  Laterality: N/A;  . ESOPHAGOGASTRODUODENOSCOPY (EGD) WITH PROPOFOL N/A 02/11/2016   Procedure: ESOPHAGOGASTRODUODENOSCOPY (EGD) WITH PROPOFOL;  Surgeon: Manya Silvas, MD;  Location: [facility] ENDOSCOPY;  Service: Endoscopy;  Laterality: N/A;  . HEMORRHOID SURGERY    . NASAL SINUS SURGERY    . SAVORY  DILATION N/A 02/22/2015   Procedure: SAVORY DILATION;  Surgeon: Josefine Class, MD;  Location: [facility] ENDOSCOPY;  Service: Endoscopy;  Laterality: N/A;  . SAVORY DILATION N/A 02/11/2016   Procedure: SAVORY DILATION;  Surgeon: Manya Silvas, MD;  Location: [facility] ENDOSCOPY;  Service: Endoscopy;  Laterality: N/A;  . SKIN CANCER EXCISION      Family Psychiatric History: I have reviewed family psychiatric history from progress note on 09/11/2018  Family History:  Family History  Problem Relation Age of Onset  . Cancer Mother        colon cancer  . Depression Mother   . Anxiety disorder Mother   . Heart disease Father   . Cancer Father        ? neck cancer- still living.   . Anxiety disorder Father   . Depression Father   . Hypoparathyroidism Son   . Colon cancer Maternal Aunt        died in 75s.     Social History: I have reviewed social history from progress note on 09/11/2018 Social History   Socioeconomic History  . Marital status: Married    Spouse name: Legrand Como  . Number of children: Not on file  . Years of education: Not on file  . Highest education level: Not on file  Occupational History  . Not on file  Tobacco Use  . Smoking status: Never Smoker  . Smokeless tobacco: Never Used  Vaping Use  . Vaping Use: Not on file  Substance and Sexual Activity  . Alcohol use: No    Alcohol/week: 0.0 standard drinks    Comment: MAYBE ONCE OR TWICE A MONTH  . Drug use: No  . Sexual activity: Not Currently    Partners: Male  Other Topics Concern  . Not on file  Social History Narrative   Lives in [place]; taught high school- math; retd; no smoking; social alcohol.    Social Determinants of Health   Financial Resource Strain: Not on file  Food Insecurity: Not on file  Transportation Needs: Not on file  Physical Activity: Not on file  Stress: Not on file  Social Connections: Not on file    Allergies:  Allergies  Allergen Reactions  . Levofloxacin Other (See  Comments) and Nausea Only    Lethargic, Flu-like symptoms, Hot flashes   . Atorvastatin   . Hydrochlorothiazide Other (See Comments)    Intolerance - dry lips  . Parafon Forte Dsc [Chlorzoxazone]   . Pollen Extract   . Sudafed [Pseudoephedrine Hcl]     Heart racing    Metabolic Disorder Labs: No results found for: HGBA1C, MPG Lab Results  Component Value Date   PROLACTIN 18.3 07/22/2014   No results found for: CHOL, TRIG, HDL, CHOLHDL, VLDL, LDLCALC Lab Results  Component Value Date   TSH 1.01 08/05/2020   TSH 1.14 04/05/2020    Therapeutic Level Labs: No results found for: LITHIUM Lab Results  Component Value Date   VALPROATE 39 (L) 02/04/2019  No components found for:  CBMZ  Current Medications: Current Outpatient Medications  Medication Sig Dispense Refill  . esomeprazole (NEXIUM) 40 MG capsule Take by mouth.    Marland Kitchen albuterol (VENTOLIN HFA) 108 (90 Base) MCG/ACT inhaler Inhale into the lungs.    Marland Kitchen azelastine (ASTELIN) 0.1 % nasal spray Place into the nose.    . calcitRIOL (ROCALTROL) 0.25 MCG capsule Take 1 capsule (0.25 mcg total) by mouth daily. 90 capsule 1  . escitalopram (LEXAPRO) 10 MG tablet TAKE 1 AND 1/2 TABLET BY MOUTH DAILY 45 tablet 1  . Eszopiclone 3 MG TABS TAKE ONE TABLET BY MOUTH IMMEDIATELY BEFORE BEDTIME 30 tablet 1  . fenofibrate 160 MG tablet Take by mouth.    . fluticasone (FLONASE) 50 MCG/ACT nasal spray     . gabapentin (NEURONTIN) 300 MG capsule Take 300 mg by mouth. 2 CAP AM, 1 CAP Q noon, 2 CAP pm,    . HYDROcodone bit-homatropine (HYCODAN) 5-1.5 MG/5ML syrup hydrocodone-homatropine 5 mg-1.5 mg/5 mL oral syrup    . hydrOXYzine (VISTARIL) 50 MG capsule Take 1 capsule (50 mg total) by mouth 2 (two) times daily as needed for anxiety. 60 capsule 1  . lamoTRIgine (LAMICTAL) 100 MG tablet TAKE 1/2 TABLET BY MOUTH TWICE A DAY 90 tablet 0  . lamoTRIgine (LAMICTAL) 25 MG tablet Take 1 tablet (25 mg total) by mouth daily. Take along with Lamictal 100  mg daily 30 tablet 1  . LATUDA 80 MG TABS tablet TAKE ONE TABLET BY MOUTH DAILY WITH BREAKFAST 30 tablet 1  . levothyroxine (SYNTHROID) 75 MCG tablet TAKE ONE TABLET BY MOUTH DAILY BEFORE BREAKFAST 90 tablet 1  . lisinopril (ZESTRIL) 10 MG tablet Take 10 mg by mouth daily.    Marland Kitchen lovastatin (MEVACOR) 20 MG tablet Take 20 mg by mouth at bedtime.    . ondansetron (ZOFRAN-ODT) 4 MG disintegrating tablet     . pantoprazole (PROTONIX) 40 MG tablet Take 1 tablet (40 mg total) by mouth daily. 30 tablet 1  . prazosin (MINIPRESS) 2 MG capsule Take 1 capsule (2 mg total) by mouth at bedtime. 30 capsule 1  . propranolol (INNOPRAN XL) 80 MG 24 hr capsule Take by mouth.    . propranolol ER (INDERAL LA) 80 MG 24 hr capsule     . vitamin B-12 (CYANOCOBALAMIN) 1000 MCG tablet Take by mouth.      No current facility-administered medications for this visit.     Musculoskeletal: Strength & Muscle Tone: UTA Gait & Station: UTA Patient leans: N/A  Psychiatric Specialty Exam: Review of Systems  Psychiatric/Behavioral: Positive for decreased concentration, dysphoric mood and sleep disturbance. The patient is nervous/anxious.   All other systems reviewed and are negative.   There were no vitals taken for this visit.There is no height or weight on file to calculate BMI.  General Appearance: Casual  Eye Contact:  Good  Speech:  Normal Rate  Volume:  Normal  Mood:  Anxious and Depressed improving  Affect:  Congruent  Thought Process:  Goal Directed and Descriptions of Associations: Intact  Orientation:  Full (Time, Place, and Person)  Thought Content: Logical   Suicidal Thoughts:  No  Homicidal Thoughts:  No  Memory:  Immediate;   Fair Recent;   Fair Remote;   Fair  Judgement:  Fair  Insight:  Fair  Psychomotor Activity:  Normal  Concentration:  Concentration: Fair and Attention Span: Fair  Recall:  [employer] of Knowledge: Fair  Language: Fair  Akathisia:  No  Handed:  Right  AIMS (if  indicated): UTA  Assets:  Communication Skills Desire for Improvement Housing Social Support  ADL's:  Intact  Cognition: WNL  Sleep:  Improving   Screenings: PHQ2-9   Flowsheet Row Video Visit from 12/15/2020 in [place] Video Visit from 10/13/2020 in [place] Video Visit from 09/20/2020 in [place]  PHQ-2 Total Score 4 5 4   PHQ-9 Total Score 15 20 --    Flowsheet Row Video Visit from 09/20/2020 in [place] No Risk       Assessment and Plan: RAVYN NIKKEL is a 60 year old Caucasian female, married, lives in [place], has a history of bipolar disorder, anxiety disorder, fibromyalgia, IBS was evaluated by telemedicine today.  Patient is currently undergoing TMS therapy, continues to have depression, anxiety although with some improvement.  Plan as noted below. The patient demonstrates the following risk factors for suicide: Chronic risk factors for suicide include: psychiatric disorder of bipolar disorder, anxiety and chronic pain. Acute risk factors for suicide include: family or marital conflict. Protective factors for this patient include: positive social support, positive therapeutic relationship, coping skills and hope for the future. Considering these factors, the overall suicide risk at this point appears to be low. Patient is appropriate for outpatient follow up.    Plan Bipolar disorder- improving Lamictal 125 mg p.o. daily in divided dosage Continue Latuda 80 mg p.o. daily with supper Lexapro 15 mg p.o. daily Lunesta 3 mg p.o. nightly Hydroxyzine 50 mg p.o. twice daily as needed for severe anxiety attacks only Continue CBT with Ms. Miguel Dibble  GAD-improving Continue CBT with Ms. Miguel Dibble Lexapro 15 mg p.o. daily Hydroxyzine 50 mg p.o. twice daily as needed Will not recommend benzodiazepines for this patient due to  history of misusing or overusing  Insomnia-improving Prazosin 2 mg p.o. nightly Lunesta 3 mg p.o. nightly Patient to continue sleep hygiene techniques.  Continue TMS treatment, she currently is undergoing [place] with [facility] [place]-already had 5 treatments.  Follow-up in clinic in 4 weeks or sooner if needed.  This note was generated in part or whole with voice recognition software. Voice recognition is usually quite accurate but there are transcription errors that can and very often do occur. I apologize for any typographical errors that were not detected and corrected.     Ursula Alert, MD 12/15/2020, 2:09 PM

## 2020-12-23 ENCOUNTER — Other Ambulatory Visit: Payer: Self-pay | Admitting: Psychiatry

## 2020-12-23 DIAGNOSIS — G4701 Insomnia due to medical condition: Secondary | ICD-10-CM

## 2020-12-23 NOTE — Telephone Encounter (Signed)
Patient not due for refill

## 2020-12-24 ENCOUNTER — Other Ambulatory Visit: Payer: Self-pay | Admitting: Endocrinology

## 2021-01-14 ENCOUNTER — Other Ambulatory Visit: Payer: Self-pay | Admitting: Psychiatry

## 2021-01-14 DIAGNOSIS — F3130 Bipolar disorder, current episode depressed, mild or moderate severity, unspecified: Secondary | ICD-10-CM

## 2021-01-14 DIAGNOSIS — G4701 Insomnia due to medical condition: Secondary | ICD-10-CM

## 2021-01-14 DIAGNOSIS — F411 Generalized anxiety disorder: Secondary | ICD-10-CM

## 2021-01-18 ENCOUNTER — Other Ambulatory Visit: Payer: Self-pay | Admitting: Psychiatry

## 2021-01-18 DIAGNOSIS — F3130 Bipolar disorder, current episode depressed, mild or moderate severity, unspecified: Secondary | ICD-10-CM

## 2021-01-18 MED ORDER — LAMOTRIGINE 100 MG PO TABS: 50.0000 mg | ORAL_TABLET | Freq: Two times a day (BID) | ORAL | 0 refills | Status: DC

## 2021-01-24 ENCOUNTER — Other Ambulatory Visit: Payer: Self-pay | Admitting: Family Medicine

## 2021-01-24 ENCOUNTER — Telehealth: Payer: Medicare PPO | Admitting: Psychiatry

## 2021-01-24 DIAGNOSIS — R1032 Left lower quadrant pain: Secondary | ICD-10-CM

## 2021-01-25 ENCOUNTER — Encounter: Payer: Self-pay | Admitting: Psychiatry

## 2021-01-25 ENCOUNTER — Telehealth (INDEPENDENT_AMBULATORY_CARE_PROVIDER_SITE_OTHER): Payer: Medicare PPO | Admitting: Psychiatry

## 2021-01-25 ENCOUNTER — Other Ambulatory Visit: Payer: Self-pay

## 2021-01-25 DIAGNOSIS — G4701 Insomnia due to medical condition: Secondary | ICD-10-CM

## 2021-01-25 DIAGNOSIS — F3175 Bipolar disorder, in partial remission, most recent episode depressed: Secondary | ICD-10-CM | POA: Diagnosis not present

## 2021-01-25 DIAGNOSIS — F411 Generalized anxiety disorder: Secondary | ICD-10-CM | POA: Diagnosis not present

## 2021-01-25 MED ORDER — LURASIDONE HCL 80 MG PO TABS: 80.0000 mg | ORAL_TABLET | Freq: Every day | ORAL | 1 refills | Status: DC

## 2021-01-25 MED ORDER — ESCITALOPRAM OXALATE 10 MG PO TABS: 15.0000 mg | ORAL_TABLET | Freq: Every day | ORAL | 1 refills | Status: DC

## 2021-01-25 MED ORDER — ESZOPICLONE 3 MG PO TABS: ORAL_TABLET | ORAL | 1 refills | Status: DC

## 2021-01-25 NOTE — Progress Notes (Signed)
Virtual Visit via Video Note  I connected with Kaylee Gordon on 01/25/21 at  2:00 PM EDT by a video enabled telemedicine application and verified that I am speaking with the correct person using two identifiers.  Location Provider Location : [facility] Patient Location : Home  Participants: Patient , Provider   I discussed the limitations of evaluation and management by telemedicine and the availability of in person appointments. The patient expressed understanding and agreed to proceed.    I discussed the assessment and treatment plan with the patient. The patient was provided an opportunity to ask questions and all were answered. The patient agreed with the plan and demonstrated an understanding of the instructions.   The patient was advised to call back or seek an in-person evaluation if the symptoms worsen or if the condition fails to improve as anticipated.   [place] MD OP Progress Note  01/25/2021 2:39 PM Kaylee Gordon  MRN:  [id number]  Chief Complaint:  Chief Complaint   Follow-up; Anxiety; Depression    HPI: Kaylee Gordon is a 60 year old Caucasian female who has a history of bipolar disorder, GAD, fibromyalgia, married, lives in [place] was evaluated by telemedicine today.  Patient today reports she is currently in [facility] therapy.  She reports she feels much better with regards to her depressive symptoms.  She reports she has not had any suicidal thoughts recently.  That is a huge improvement for her.  Patient continues to have fatigue however reports she also has fibromyalgia which could be contributing to that.  Patient reports sleep likely restless.  She reports she goes to bed anywhere between 11 PM to 1 AM.  She reports she watches TV shows and takes her medication only when she decides to go to bed.  She reports she  has difficulty falling asleep most nights however when she falls asleep she is able to sleep through the night.  Patient reports she wakes up at around 9 or  10 AM most days.  She does not have a good sleep hygiene.  Patient reports her appetite as limited some days.  She reports she does not have a good routine with taking her meals.  She is currently going through GI problems , evaluated by primary care provider Dr. Netty Starring on 01/10/2021 for worsening nausea, indigestion, chills, possible fever and headaches.  Abdominal pain acute, concerning for acute colitis-was started on prednisone taper x12 days.     She denies any hallucinations or other perceptual disturbances.  She continues to follow-up with her therapist Ms. Miguel Dibble.  Patient denies any other concerns today.  Visit Diagnosis:    ICD-10-CM   1. Bipolar 1 disorder, depressed, partial remission (HCC)  F31.75 lurasidone (LATUDA) 80 MG TABS tablet    2. GAD (generalized anxiety disorder)  F41.1 escitalopram (LEXAPRO) 10 MG tablet    3. Insomnia due to medical condition  G47.01 Eszopiclone 3 MG TABS   pain, anxiety      Past Psychiatric History: I have reviewed past psychiatric history from progress note on 09/11/2018  Past Medical History:  Past Medical History:  Diagnosis Date   Anxiety    Asthma    B12 deficiency    Cancer ([place])    SKIN CANCER   CHF (congestive heart failure) ([place])    Depression    Hypertension    Thyroid disease     Past Surgical History:  Procedure Laterality Date   ANKLE SURGERY Right 2012   APPENDECTOMY  AUGMENTATION MAMMAPLASTY Bilateral    BREAST ENHANCEMENT SURGERY     COLONOSCOPY WITH PROPOFOL N/A 02/22/2015   Procedure: COLONOSCOPY WITH PROPOFOL;  Surgeon: Josefine Class, MD;  Location: [facility] ENDOSCOPY;  Service: Endoscopy;  Laterality: N/A;   ESOPHAGOGASTRODUODENOSCOPY (EGD) WITH PROPOFOL N/A 02/22/2015   Procedure: ESOPHAGOGASTRODUODENOSCOPY (EGD) WITH PROPOFOL;  Surgeon: Josefine Class, MD;  Location: [facility] ENDOSCOPY;  Service: Endoscopy;  Laterality: N/A;   ESOPHAGOGASTRODUODENOSCOPY (EGD) WITH PROPOFOL N/A 02/11/2016    Procedure: ESOPHAGOGASTRODUODENOSCOPY (EGD) WITH PROPOFOL;  Surgeon: Manya Silvas, MD;  Location: [facility] ENDOSCOPY;  Service: Endoscopy;  Laterality: N/A;   HEMORRHOID SURGERY     NASAL SINUS SURGERY     SAVORY DILATION N/A 02/22/2015   Procedure: SAVORY DILATION;  Surgeon: Josefine Class, MD;  Location: [facility] ENDOSCOPY;  Service: Endoscopy;  Laterality: N/A;   SAVORY DILATION N/A 02/11/2016   Procedure: SAVORY DILATION;  Surgeon: Manya Silvas, MD;  Location: [facility] ENDOSCOPY;  Service: Endoscopy;  Laterality: N/A;   SKIN CANCER EXCISION      Family Psychiatric History: I have reviewed family psychiatric history from my progress note on 09/11/2018  Family History:  Family History  Problem Relation Age of Onset   Cancer Mother        colon cancer   Depression Mother    Anxiety disorder Mother    Heart disease Father    Cancer Father        ? neck cancer- still living.    Anxiety disorder Father    Depression Father    Hypoparathyroidism Son    Colon cancer Maternal Aunt        died in 35s.     Social History: I have reviewed social history from progress note on 09/11/2018 Social History   Socioeconomic History   Marital status: Married    Spouse name: Legrand Como   Number of children: Not on file   Years of education: Not on file   Highest education level: Not on file  Occupational History   Not on file  Tobacco Use   Smoking status: Never   Smokeless tobacco: Never  Vaping Use   Vaping Use: Not on file  Substance and Sexual Activity   Alcohol use: No    Alcohol/week: 0.0 standard drinks    Comment: MAYBE ONCE OR TWICE A MONTH   Drug use: No   Sexual activity: Not Currently    Partners: Male  Other Topics Concern   Not on file  Social History Narrative   Lives in [place]; taught high school- math; retd; no smoking; social alcohol.    Social Determinants of Health   Financial Resource Strain: Not on file  Food Insecurity: Not on file  Transportation  Needs: Not on file  Physical Activity: Not on file  Stress: Not on file  Social Connections: Not on file    Allergies:  Allergies  Allergen Reactions   Levofloxacin Other (See Comments) and Nausea Only    Lethargic, Flu-like symptoms, Hot flashes    Atorvastatin    Hydrochlorothiazide Other (See Comments)    Intolerance - dry lips   Parafon Forte Dsc [Chlorzoxazone]    Pollen Extract    Sudafed [Pseudoephedrine Hcl]     Heart racing    Metabolic Disorder Labs: No results found for: HGBA1C, MPG Lab Results  Component Value Date   PROLACTIN 18.3 07/22/2014   No results found for: CHOL, TRIG, HDL, CHOLHDL, VLDL, LDLCALC Lab Results  Component Value Date   TSH  1.01 08/05/2020   TSH 1.14 04/05/2020    Therapeutic Level Labs: No results found for: LITHIUM Lab Results  Component Value Date   VALPROATE 39 (L) 02/04/2019   No components found for:  CBMZ  Current Medications: Current Outpatient Medications  Medication Sig Dispense Refill   fenofibrate 160 MG tablet Take 1 tablet by mouth daily.     predniSONE (DELTASONE) 10 MG tablet 4 tabs po daily x 3 days, 3 tabs po daily x 3 days, 2 tabs po daily x 3 days, 1 tab po daily x 3 days     albuterol (VENTOLIN HFA) 108 (90 Base) MCG/ACT inhaler Inhale into the lungs.     azelastine (ASTELIN) 0.1 % nasal spray Place into the nose.     calcitRIOL (ROCALTROL) 0.25 MCG capsule Take 1 capsule (0.25 mcg total) by mouth daily. 90 capsule 1   escitalopram (LEXAPRO) 10 MG tablet Take 1.5 tablets (15 mg total) by mouth daily. 45 tablet 1   esomeprazole (NEXIUM) 40 MG capsule Take by mouth.     Eszopiclone 3 MG TABS TAKE ONE TABLET BY MOUTH IMMEDIATELY BEFORE BEDTIME 30 tablet 1   fenofibrate 160 MG tablet Take by mouth.     fluticasone (FLONASE) 50 MCG/ACT nasal spray      gabapentin (NEURONTIN) 300 MG capsule Take 300 mg by mouth. 2 CAP AM, 1 CAP Q noon, 2 CAP pm,     HYDROcodone bit-homatropine (HYCODAN) 5-1.5 MG/5ML syrup  hydrocodone-homatropine 5 mg-1.5 mg/5 mL oral syrup     hydrOXYzine (VISTARIL) 50 MG capsule Take 1 capsule (50 mg total) by mouth 2 (two) times daily as needed for anxiety. 60 capsule 1   lamoTRIgine (LAMICTAL) 100 MG tablet Take 0.5 tablets (50 mg total) by mouth 2 (two) times daily. 90 tablet 0   lamoTRIgine (LAMICTAL) 25 MG tablet TAKE ONE TABLET BY MOUTH DAILY ALONG WITH LAMICTAL 100MG  DAILY 30 tablet 1   levothyroxine (SYNTHROID) 75 MCG tablet TAKE ONE TABLET BY MOUTH EVERY MORNING BEFORE BREAKFAST 90 tablet 1   lisinopril (ZESTRIL) 10 MG tablet Take 10 mg by mouth daily.     lovastatin (MEVACOR) 20 MG tablet Take 20 mg by mouth at bedtime.     lurasidone (LATUDA) 80 MG TABS tablet Take 1 tablet (80 mg total) by mouth daily with breakfast. 30 tablet 1   ondansetron (ZOFRAN-ODT) 4 MG disintegrating tablet      pantoprazole (PROTONIX) 40 MG tablet Take 1 tablet (40 mg total) by mouth daily. 30 tablet 1   prazosin (MINIPRESS) 2 MG capsule TAKE ONE CAPSULE BY MOUTH EVERY NIGHT AT BEDTIME 30 capsule 1   propranolol (INNOPRAN XL) 80 MG 24 hr capsule Take by mouth.     propranolol ER (INDERAL LA) 80 MG 24 hr capsule      vitamin B-12 (CYANOCOBALAMIN) 1000 MCG tablet Take by mouth.      No current facility-administered medications for this visit.     Musculoskeletal: Strength & Muscle Tone:  UTA Gait & Station:  UTA Patient leans: N/A  Psychiatric Specialty Exam: Review of Systems  Gastrointestinal:  Positive for abdominal pain (improving on prednisone).  Psychiatric/Behavioral:  Positive for dysphoric mood and sleep disturbance. The patient is nervous/anxious.   All other systems reviewed and are negative.  There were no vitals taken for this visit.There is no height or weight on file to calculate BMI.  General Appearance: Casual  Eye Contact:  Fair  Speech:  Clear and Coherent  Volume:  Normal  Mood:  Anxious and Dysphoric improving  Affect:  Congruent  Thought Process:  Goal  Directed and Descriptions of Associations: Intact  Orientation:  Full (Time, Place, and Person)  Thought Content: Logical   Suicidal Thoughts:  No  Homicidal Thoughts:  No  Memory:  Immediate;   Fair Recent;   Fair Remote;   Fair  Judgement:  Fair  Insight:  Fair  Psychomotor Activity:  Normal  Concentration:  Concentration: Fair and Attention Span: Fair  Recall:  [employer] of Knowledge: Fair  Language: Fair  Akathisia:  No  Handed:  Right  AIMS (if indicated): not done  Assets:  Communication Skills Desire for Improvement Social Support  ADL's:  Intact  Cognition: WNL  Sleep:   Restless   Screenings: PHQ2-9    Flowsheet Row Video Visit from 01/25/2021 in [facility] Video Visit from 12/15/2020 in [facility] Video Visit from 10/13/2020 in [facility] Video Visit from 09/20/2020 in [facility]  PHQ-2 Total Score 3 4 5 4   PHQ-9 Total Score 11 15 20  --      Flowsheet Row Video Visit from 09/20/2020 in [facility] No Risk        Assessment and Plan: Kaylee Gordon is a 60 year old Caucasian female, married, lives in [place], has a history of bipolar disorder, anxiety disorder, fibromyalgia, IBS was evaluated by telemedicine today.  Patient is currently undergoing TMS therapy and does report improvement in her mood although she continues to have sleep problems likely related to not having a good sleep hygiene.  Discussed plan as noted below.  Plan Bipolar disorder-in partial remission Lamotrigine 125 mg p.o. daily in divided dosage Latuda 80 mg p.o. daily with supply Lexapro 15 mg p.o. daily Lunesta 3 mg p.o. nightly Hydroxyzine 50 mg p.o. twice daily as needed for severe anxiety attacks only Continue CBT with Ms. Miguel Dibble Continue TMS  GAD-improving Continue CBT Lexapro 15 mg p.o. daily Will not  recommend benzodiazepines due to history of misusing or overusing Continue hydroxyzine 50 mg p.o. twice daily as needed  Insomnia-unstable Discussed with patient to work on her sleep hygiene, she currently does not have a good sleep routine. Prazosin 2 mg p.o. nightly Lunesta 3 mg p.o. nightly  Follow-up in clinic in 4 weeks or sooner if needed.  This note was generated in part or whole with voice recognition software. Voice recognition is usually quite accurate but there are transcription errors that can and very often do occur. I apologize for any typographical errors that were not detected and corrected.      Ursula Alert, MD 01/26/2021, 1:49 PM

## 2021-02-03 ENCOUNTER — Ambulatory Visit: Payer: Medicare PPO | Admitting: Endocrinology

## 2021-02-03 ENCOUNTER — Encounter: Payer: Self-pay | Admitting: Endocrinology

## 2021-02-03 ENCOUNTER — Other Ambulatory Visit: Payer: Self-pay

## 2021-02-03 VITALS — BP 112/76 | HR 83 | Ht 64.5 in | Wt 176.6 lb

## 2021-02-03 DIAGNOSIS — R7301 Impaired fasting glucose: Secondary | ICD-10-CM | POA: Diagnosis not present

## 2021-02-03 DIAGNOSIS — E038 Other specified hypothyroidism: Secondary | ICD-10-CM | POA: Diagnosis not present

## 2021-02-03 DIAGNOSIS — E200 Idiopathic hypoparathyroidism: Secondary | ICD-10-CM | POA: Diagnosis not present

## 2021-02-03 DIAGNOSIS — E782 Mixed hyperlipidemia: Secondary | ICD-10-CM | POA: Diagnosis not present

## 2021-02-03 NOTE — Progress Notes (Signed)
Patient ID: Kaylee Gordon, female   DOB: [birth year], 60 y.o.   MRN: [id number]    History of Present Illness:  Chief complaint: Endocrinology follow-up    Hypothyroidism:  She was initially given thyroxine supplement with a borderline TSH levels in 2011 However because of complaints of  fatigue on her visit in 10/15 she had thyroid levels done and free T4 was significantly low She was empirically started on 25 g  of levothyroxine Initially she felt less tired with this; however her free T4 continue to be low and her dose has been increased to 75 g since 1/16 when her free T4 was still low at 0.59  She is taking 75 mcg levothyroxine since her free T4 was relatively low in 6/21  She has longstanding fatigue which is likely related to fibromyalgia This does not seem different recently  She is taking her levothyroxine regularly in the morning before breakfast  She takes her calcium in the evening after dinner Labs pending  Wt Readings from Last 3 Encounters:  02/03/21 176 lb 9.6 oz (80.1 kg)  08/05/20 176 lb 3.2 oz (79.9 kg)  04/05/20 177 lb (80.3 kg)    Lab Results  Component Value Date   FREET4 0.86 08/05/2020   FREET4 1.01 04/05/2020   FREET4 0.70 12/17/2019   TSH 1.01 08/05/2020   TSH 1.14 04/05/2020   TSH 3.95 12/17/2019    Hypoparathyroidism    Has had hypocalcemia since she was 60 years old and presented with symptoms of cramping in her hands along with twitching of her face and eyes. 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   No symptoms of twitching or tingling in her face, muscle cramping in her hands.  She is taking calcitriol 0.25 mcg daily in the morning and her calcium twice a day She takes her supplements very regularly  Last calcium normal done recently from PCP = 9.2  Lab Results  Component Value Date   CALCIUM 9.1 04/05/2020   CALCIUM 8.7 (L) 03/12/2020   CALCIUM 9.0 02/24/2020    CALCIUM 9.4 12/17/2019   CALCIUM 9.3 05/22/2019   Lab Results  Component Value Date   K 4.2 04/05/2020      Allergies as of 02/03/2021       Reactions   Levofloxacin Other (See Comments), Nausea Only   Lethargic, Flu-like symptoms, Hot flashes    Atorvastatin    Hydrochlorothiazide Other (See Comments)   Intolerance - dry lips   Parafon Forte Dsc [chlorzoxazone]    Pollen Extract    Sudafed [pseudoephedrine Hcl]    Heart racing        Medication List        Accurate as of February 03, 2021  2:11 PM. If you have any questions, ask your nurse or doctor.          albuterol 108 (90 Base) MCG/ACT inhaler Commonly known as: VENTOLIN HFA Inhale into the lungs.   azelastine 0.1 % nasal spray Commonly known as: ASTELIN Place into the nose.   calcitRIOL 0.25 MCG capsule Commonly known as: ROCALTROL Take 1 capsule (0.25 mcg total) by mouth daily.   escitalopram 10 MG tablet Commonly known as: LEXAPRO Take 1.5 tablets (15 mg total) by mouth daily.   esomeprazole 40 MG capsule Commonly known as: NEXIUM Take by mouth.   Eszopiclone 3 MG Tabs TAKE ONE TABLET BY MOUTH IMMEDIATELY BEFORE BEDTIME   fenofibrate  160 MG tablet Take by mouth.   fenofibrate 160 MG tablet Take 1 tablet by mouth daily.   fluticasone 50 MCG/ACT nasal spray Commonly known as: FLONASE   gabapentin 300 MG capsule Commonly known as: NEURONTIN Take 300 mg by mouth. 2 CAP AM, 1 CAP Q noon, 2 CAP pm,   HYDROcodone bit-homatropine 5-1.5 MG/5ML syrup Commonly known as: HYCODAN hydrocodone-homatropine 5 mg-1.5 mg/5 mL oral syrup   hydrOXYzine 50 MG capsule Commonly known as: Vistaril Take 1 capsule (50 mg total) by mouth 2 (two) times daily as needed for anxiety.   lamoTRIgine 25 MG tablet Commonly known as: LAMICTAL TAKE ONE TABLET BY MOUTH DAILY ALONG WITH LAMICTAL 100MG  DAILY   lamoTRIgine 100 MG tablet Commonly known as: LAMICTAL Take 0.5 tablets (50 mg total) by mouth 2 (two) times  daily.   levothyroxine 75 MCG tablet Commonly known as: SYNTHROID TAKE ONE TABLET BY MOUTH EVERY MORNING BEFORE BREAKFAST   lisinopril 10 MG tablet Commonly known as: ZESTRIL Take 10 mg by mouth daily.   lovastatin 20 MG tablet Commonly known as: MEVACOR Take 20 mg by mouth at bedtime.   lurasidone 80 MG Tabs tablet Commonly known as: Latuda Take 1 tablet (80 mg total) by mouth daily with breakfast.   ondansetron 4 MG disintegrating tablet Commonly known as: ZOFRAN-ODT   pantoprazole 40 MG tablet Commonly known as: PROTONIX Take 1 tablet (40 mg total) by mouth daily.   prazosin 2 MG capsule Commonly known as: MINIPRESS TAKE ONE CAPSULE BY MOUTH EVERY NIGHT AT BEDTIME   predniSONE 10 MG tablet Commonly known as: DELTASONE 4 tabs po daily x 3 days, 3 tabs po daily x 3 days, 2 tabs po daily x 3 days, 1 tab po daily x 3 days   propranolol 80 MG 24 hr capsule Commonly known as: INNOPRAN XL Take by mouth.   propranolol ER 80 MG 24 hr capsule Commonly known as: INDERAL LA   vitamin B-12 1000 MCG tablet Commonly known as: CYANOCOBALAMIN Take by mouth.        Allergies:  Allergies  Allergen Reactions   Levofloxacin Other (See Comments) and Nausea Only    Lethargic, Flu-like symptoms, Hot flashes    Atorvastatin    Hydrochlorothiazide Other (See Comments)    Intolerance - dry lips   Parafon Forte Dsc [Chlorzoxazone]    Pollen Extract    Sudafed [Pseudoephedrine Hcl]     Heart racing    Past Medical History:  Diagnosis Date   Anxiety    Asthma    B12 deficiency    Cancer ([place])    SKIN CANCER   CHF (congestive heart failure) ([place])    Depression    Hypertension    Thyroid disease     Past Surgical History:  Procedure Laterality Date   ANKLE SURGERY Right 2012   APPENDECTOMY     AUGMENTATION MAMMAPLASTY Bilateral    BREAST ENHANCEMENT SURGERY     COLONOSCOPY WITH PROPOFOL N/A 02/22/2015   Procedure: COLONOSCOPY WITH PROPOFOL;  Surgeon: Josefine Class, MD;  Location: [facility] ENDOSCOPY;  Service: Endoscopy;  Laterality: N/A;   ESOPHAGOGASTRODUODENOSCOPY (EGD) WITH PROPOFOL N/A 02/22/2015   Procedure: ESOPHAGOGASTRODUODENOSCOPY (EGD) WITH PROPOFOL;  Surgeon: Josefine Class, MD;  Location: [facility] ENDOSCOPY;  Service: Endoscopy;  Laterality: N/A;   ESOPHAGOGASTRODUODENOSCOPY (EGD) WITH PROPOFOL N/A 02/11/2016   Procedure: ESOPHAGOGASTRODUODENOSCOPY (EGD) WITH PROPOFOL;  Surgeon: Manya Silvas, MD;  Location: [facility] ENDOSCOPY;  Service: Endoscopy;  Laterality: N/A;   HEMORRHOID SURGERY  NASAL SINUS SURGERY     SAVORY DILATION N/A 02/22/2015   Procedure: SAVORY DILATION;  Surgeon: Josefine Class, MD;  Location[facility] ENDOSCOPY;  Service: Endoscopy;  Laterality: N/A;   SAVORY DILATION N/A 02/11/2016   Procedure: SAVORY DILATION;  Surgeon: Manya Silvas, MD;  Location: [facility] ENDOSCOPY;  Service: Endoscopy;  Laterality: N/A;   SKIN CANCER EXCISION      Family History  Problem Relation Age of Onset   Cancer Mother        colon cancer   Depression Mother    Anxiety disorder Mother    Heart disease Father    Cancer Father        ? neck cancer- still living.    Anxiety disorder Father    Depression Father    Hypoparathyroidism Son    Colon cancer Maternal Aunt        died in 16s.     Social History:  reports that she has never smoked. She has never used smokeless tobacco. She reports that she does not drink alcohol and does not use drugs.  Review of Systems    History of depression, is on duloxetine, gabapentin and Seroquel long-term  She takes vitamin B12 supplements, last B12 level normal  She has not triglycerides of about 450, also has a fasting glucose of 103. No follow-up available A1c in the past 4-5.8  EXAM:  BP 112/76   Pulse 83   Ht 5' 4.5" (1.638 m)   Wt 176 lb 9.6 oz (80.1 kg)   SpO2 96%   BMI 29.85 kg/m     Assessment/Plan:    Hypoparathyroidism: Her symptoms are consistently controlled with 0.25  mcg of calcitriol and one calcium tablet daily Recent calcium normal as before  Increased creatinine of 1.3: This may have been from dehydration and needs follow-up  Secondary HYPOTHYROIDISM: She has had nonspecific fatigue but no other symptoms suggestive of hypothyroidism  Currently on a stable dose of 75 mcg levothyroxine; needs follow-up labs  History of high triglycerides, likely metabolic syndrome with prediabetes in the past, needs follow-up  She will get fasting labs including lipids [facility] and will discuss results when available    Elayne Snare 02/03/2021, 2:11 PM

## 2021-02-08 ENCOUNTER — Ambulatory Visit: Admission: RE | Admit: 2021-02-08 | Payer: Medicare PPO | Source: Ambulatory Visit

## 2021-02-10 ENCOUNTER — Encounter: Payer: Self-pay | Admitting: Psychiatry

## 2021-02-10 ENCOUNTER — Other Ambulatory Visit: Payer: Self-pay

## 2021-02-10 ENCOUNTER — Telehealth (INDEPENDENT_AMBULATORY_CARE_PROVIDER_SITE_OTHER): Payer: Medicare PPO | Admitting: Psychiatry

## 2021-02-10 DIAGNOSIS — G4701 Insomnia due to medical condition: Secondary | ICD-10-CM

## 2021-02-10 DIAGNOSIS — F411 Generalized anxiety disorder: Secondary | ICD-10-CM

## 2021-02-10 DIAGNOSIS — F3175 Bipolar disorder, in partial remission, most recent episode depressed: Secondary | ICD-10-CM | POA: Diagnosis not present

## 2021-02-10 MED ORDER — HYDROXYZINE PAMOATE 50 MG PO CAPS: 50.0000 mg | ORAL_CAPSULE | Freq: Two times a day (BID) | ORAL | 1 refills | Status: DC | PRN

## 2021-02-10 NOTE — Progress Notes (Signed)
Virtual Visit via Video Note  I connected with Kaylee Gordon on 02/10/21 at  4:20 PM EDT by a video enabled telemedicine application and verified that I am speaking with the correct person using two identifiers.  Location Provider Location : [facility] Patient Location : Home  Participants: Patient , Provider    I discussed the limitations of evaluation and management by telemedicine and the availability of in person appointments. The patient expressed understanding and agreed to proceed.    I discussed the assessment and treatment plan with the patient. The patient was provided an opportunity to ask questions and all were answered. The patient agreed with the plan and demonstrated an understanding of the instructions.   The patient was advised to call back or seek an in-person evaluation if the symptoms worsen or if the condition fails to improve as anticipated.    Maish Vaya MD OP Progress Note  02/10/2021 4:29 PM Kaylee Gordon  MRN:  [id number]  Chief Complaint:  Chief Complaint   Follow-up; Headache    HPI: Kaylee Gordon is a 60 year old Caucasian female who has a history of bipolar disorder, GAD, fibromyalgia, married, lives in [place] was evaluated by telemedicine today.  Patient today reports she is currently undergoing [place] therapy.  She has a couple more sessions left to complete her treatment.  She reports she is currently making progress with regards to her mood.  She does not have suicidal thoughts anymore.  She also does not dwell on negative thoughts like she used to before.  She reports sleep has improved.  She continues to have some difficulty falling asleep however when she falls asleep she has been able to sleep through the night.  She continues to have low energy, lack of motivation and some concentration problems.  However she is currently going through abdominal pain, constipation likely due to her IBS.  She has reached out to her providers for help.  She reports  the abdominal pain is so severe that it affects her mood and her energy level.  Patient reports she continues to be in psychotherapy sessions and has upcoming appointment with Ms. Miguel Dibble.  She is compliant on her medications.  She denies any side effects to her medications.  She denies any suicidality, homicidality or perceptual disturbances.  Patient denies any other concerns today.  Visit Diagnosis:    ICD-10-CM   1. Bipolar 1 disorder, depressed, partial remission ([place])  F31.75     2. GAD (generalized anxiety disorder)  F41.1     3. 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 hydrOXYzine (VISTARIL) 50 MG capsule   anxiety, pain      Past Psychiatric History: Reviewed past psychiatric history from progress note on 09/11/2018  Past Medical History:  Past Medical History:  Diagnosis Date   Anxiety    Asthma    B12 deficiency    Cancer ([place])    SKIN CANCER   CHF (congestive heart failure) ([place])    Depression    Hypertension    Thyroid disease     Past Surgical History:  Procedure Laterality Date   ANKLE SURGERY Right 2012   APPENDECTOMY     AUGMENTATION MAMMAPLASTY Bilateral    BREAST ENHANCEMENT SURGERY     COLONOSCOPY WITH PROPOFOL N/A 02/22/2015   Procedure: COLONOSCOPY WITH PROPOFOL;  Surgeon: Josefine Class, MD;  Location[facility] ENDOSCOPY;  Service: Endoscopy;  Laterality: N/A;   ESOPHAGOGASTRODUODENOSCOPY (EGD) WITH PROPOFOL N/A 02/22/2015   Procedure: ESOPHAGOGASTRODUODENOSCOPY (EGD) WITH PROPOFOL;  Surgeon: Josefine Class, MD;  Location: [facility] ENDOSCOPY;  Service: Endoscopy;  Laterality: N/A;   ESOPHAGOGASTRODUODENOSCOPY (EGD) WITH PROPOFOL N/A 02/11/2016   Procedure: ESOPHAGOGASTRODUODENOSCOPY (EGD) WITH PROPOFOL;  Surgeon: Manya Silvas, MD;  Location: [facility] ENDOSCOPY;  Service: Endoscopy;  Laterality: N/A;   HEMORRHOID SURGERY     NASAL SINUS SURGERY     SAVORY DILATION N/A 02/22/2015   Procedure: SAVORY DILATION;  Surgeon: Josefine Class, MD;  Location[facility] ENDOSCOPY;  Service: Endoscopy;  Laterality: N/A;   SAVORY DILATION N/A 02/11/2016   Procedure: SAVORY DILATION;  Surgeon: Manya Silvas, MD;  Location: [facility] ENDOSCOPY;  Service: Endoscopy;  Laterality: N/A;   SKIN CANCER EXCISION      Family Psychiatric History: Reviewed family psychiatric history from progress note on 09/11/2018  Family History:  Family History  Problem Relation Age of Onset   Cancer Mother        colon cancer   Depression Mother    Anxiety disorder Mother    Heart disease Father    Cancer Father        ? neck cancer- still living.    Anxiety disorder Father    Depression Father    Hypoparathyroidism Son    Colon cancer Maternal Aunt        died in 40s.     Social History: Reviewed social history from progress note on 09/11/2018 Social History   Socioeconomic History   Marital status: Married    Spouse name: Legrand Como   Number of children: Not on file   Years of education: Not on file   Highest education level: Not on file  Occupational History   Not on file  Tobacco Use   Smoking status: Never   Smokeless tobacco: Never  Vaping Use   Vaping Use: Not on file  Substance and Sexual Activity   Alcohol use: No    Alcohol/week: 0.0 standard drinks    Comment: MAYBE ONCE OR TWICE A MONTH   Drug use: No   Sexual activity: Not Currently    Partners: Male  Other Topics Concern   Not on file  Social History Narrative   Lives in [place]; taught high school- math; retd; no smoking; social alcohol.    Social Determinants of Health   Financial Resource Strain: Not on file  Food Insecurity: Not on file  Transportation Needs: Not on file  Physical Activity: Not on file  Stress: Not on file  Social Connections: Not on file    Allergies:  Allergies  Allergen Reactions   Levofloxacin Other (See Comments) and Nausea Only    Lethargic, Flu-like symptoms, Hot flashes    Atorvastatin    Hydrochlorothiazide Other (See  Comments)    Intolerance - dry lips   Parafon Forte Dsc [Chlorzoxazone]    Pollen Extract    Sudafed [Pseudoephedrine Hcl]     Heart racing    Metabolic Disorder Labs: No results found for: HGBA1C, MPG Lab Results  Component Value Date   PROLACTIN 18.3 07/22/2014   No results found for: CHOL, TRIG, HDL, CHOLHDL, VLDL, LDLCALC Lab Results  Component Value Date   TSH 1.01 08/05/2020   TSH 1.14 04/05/2020    Therapeutic Level Labs: No results found for: LITHIUM Lab Results  Component Value Date   VALPROATE 39 (L) 02/04/2019   No components found for:  CBMZ  Current Medications: Current Outpatient Medications  Medication Sig Dispense Refill   albuterol (VENTOLIN HFA) 108 (90 Base) MCG/ACT inhaler Inhale into  the lungs.     azelastine (ASTELIN) 0.1 % nasal spray Place into the nose.     calcitRIOL (ROCALTROL) 0.25 MCG capsule Take 1 capsule (0.25 mcg total) by mouth daily. 90 capsule 1   escitalopram (LEXAPRO) 10 MG tablet Take 1.5 tablets (15 mg total) by mouth daily. 45 tablet 1   esomeprazole (NEXIUM) 40 MG capsule Take by mouth.     Eszopiclone 3 MG TABS TAKE ONE TABLET BY MOUTH IMMEDIATELY BEFORE BEDTIME 30 tablet 1   fenofibrate 160 MG tablet Take by mouth.     fenofibrate 160 MG tablet Take 1 tablet by mouth daily.     fluticasone (FLONASE) 50 MCG/ACT nasal spray      gabapentin (NEURONTIN) 300 MG capsule Take 300 mg by mouth. 2 CAP AM, 1 CAP Q noon, 2 CAP pm,     HYDROcodone bit-homatropine (HYCODAN) 5-1.5 MG/5ML syrup hydrocodone-homatropine 5 mg-1.5 mg/5 mL oral syrup (Patient not taking: Reported on 02/03/2021)     hydrOXYzine (VISTARIL) 50 MG capsule Take 1 capsule (50 mg total) by mouth 2 (two) times daily as needed for anxiety. 60 capsule 1   lamoTRIgine (LAMICTAL) 100 MG tablet Take 0.5 tablets (50 mg total) by mouth 2 (two) times daily. 90 tablet 0   lamoTRIgine (LAMICTAL) 25 MG tablet TAKE ONE TABLET BY MOUTH DAILY ALONG WITH LAMICTAL '100MG'$  DAILY 30 tablet 1    levothyroxine (SYNTHROID) 75 MCG tablet TAKE ONE TABLET BY MOUTH EVERY MORNING BEFORE BREAKFAST 90 tablet 1   lisinopril (ZESTRIL) 10 MG tablet Take 10 mg by mouth daily.     lovastatin (MEVACOR) 20 MG tablet Take 20 mg by mouth at bedtime.     lurasidone (LATUDA) 80 MG TABS tablet Take 1 tablet (80 mg total) by mouth daily with breakfast. 30 tablet 1   ondansetron (ZOFRAN-ODT) 4 MG disintegrating tablet      pantoprazole (PROTONIX) 40 MG tablet Take 1 tablet (40 mg total) by mouth daily. 30 tablet 1   prazosin (MINIPRESS) 2 MG capsule TAKE ONE CAPSULE BY MOUTH EVERY NIGHT AT BEDTIME 30 capsule 1   predniSONE (DELTASONE) 10 MG tablet 4 tabs po daily x 3 days, 3 tabs po daily x 3 days, 2 tabs po daily x 3 days, 1 tab po daily x 3 days (Patient not taking: Reported on 02/03/2021)     propranolol (INNOPRAN XL) 80 MG 24 hr capsule Take by mouth.     propranolol ER (INDERAL LA) 80 MG 24 hr capsule      vitamin B-12 (CYANOCOBALAMIN) 1000 MCG tablet Take by mouth.      No current facility-administered medications for this visit.     Musculoskeletal: Strength & Muscle Tone:  UTA Gait & Station:  UTA Patient leans: N/A  Psychiatric Specialty Exam: Review of Systems  Gastrointestinal:  Positive for abdominal pain and constipation.  Psychiatric/Behavioral:  Positive for dysphoric mood.   All other systems reviewed and are negative.  There were no vitals taken for this visit.There is no height or weight on file to calculate BMI.  General Appearance: Casual  Eye Contact:  Good  Speech:  Clear and Coherent  Volume:  Normal  Mood:  Depressed  Affect:  Congruent  Thought Process:  Goal Directed and Descriptions of Associations: Intact  Orientation:  Full (Time, Place, and Person)  Thought Content: Logical   Suicidal Thoughts:  No  Homicidal Thoughts:  No  Memory:  Immediate;   Fair Recent;   Fair Remote;   Fair  Judgement:  Fair  Insight:  Fair  Psychomotor Activity:  Normal   Concentration:  Concentration: Fair and Attention Span: Fair  Recall:  [employer] of Knowledge: Fair  Language: Fair  Akathisia:  No  Handed:  Right  AIMS (if indicated): not done  Assets:  Communication Skills Desire for Improvement Housing Social Support Talents/Skills  ADL's:  Intact  Cognition: WNL  Sleep:  Fair   Screenings: PHQ2-9    Flowsheet Row Video Visit from 02/10/2021 in [facility] Video Visit from 01/25/2021 in [facility] Video Visit from 12/15/2020 in [facility] Video Visit from 10/13/2020 in [facility] Video Visit from 09/20/2020 in [facility]  PHQ-2 Total Score '2 3 4 5 4  '$ PHQ-9 Total Score '11 11 15 20 '$ --      Flowsheet Row Video Visit from 09/20/2020 in [facility] No Risk        Assessment and Plan: Kaylee Gordon is a 60 year old Caucasian female, married, lives in [place], has a history of bipolar disorder, anxiety disorder, fibromyalgia, IBS was evaluated by telemedicine today.  Patient is currently undergoing [place] therapy and is currently making progress although she is struggling with abdominal pain, severe constipation.  Discussed plan as noted below.  Plan Bipolar disorder in partial remission Lamotrigine 125 mg p.o. daily in divided dosage Latuda 80 mg p.o. daily. Lexapro 15 mg p.o. daily Lunesta 3 mg p.o. nightly We will consider reducing the dosage of Latuda if she continues to have severe constipation.  She has upcoming appointment with her providers at [facility]. Continue TMS Continue CBT with Ms. Miguel Dibble  GAD-improving Lexapro 15 mg p.o. daily Continue CBT Hydroxyzine 50 mg p.o. twice daily as needed Will not recommend benzodiazepines due to history of misusing or overusing.  Insomnia-improving Prazosin 2 mg p.o.  nightly Lunesta 3 mg p.o. nightly Continue sleep hygiene techniques  Patient to continue to follow-up with her psychotherapist for therapy as well as grief counseling.  Follow-up in clinic in office in 1 month or sooner if needed.  This note was generated in part or whole with voice recognition software. Voice recognition is usually quite accurate but there are transcription errors that can and very often do occur. I apologize for any typographical errors that were not detected and corrected.       Ursula Alert, MD 02/11/2021, 6:01 PM

## 2021-02-17 LAB — LIPID PANEL
Chol/HDL Ratio: 5 ratio — ABNORMAL HIGH (ref 0.0–4.4)
Cholesterol, Total: 184 mg/dL (ref 100–199)
HDL: 37 mg/dL — ABNORMAL LOW (ref >39)
LDL Chol Calc (NIH): 115 mg/dL — ABNORMAL HIGH (ref 0–99)
Triglycerides: 179 mg/dL — ABNORMAL HIGH (ref 0–149)
VLDL Cholesterol Cal: 32 mg/dL (ref 5–40)

## 2021-02-17 LAB — BASIC METABOLIC PANEL
BUN/Creatinine Ratio: 12 (ref 12–28)
BUN: 14 mg/dL (ref 8–27)
CO2: 22 mmol/L (ref 20–29)
Calcium: 9.6 mg/dL (ref 8.7–10.3)
Chloride: 105 mmol/L (ref 96–106)
Creatinine, Ser: 1.19 mg/dL — ABNORMAL HIGH (ref 0.57–1.00)
Glucose: 108 mg/dL — ABNORMAL HIGH (ref 65–99)
Potassium: 3.7 mmol/L (ref 3.5–5.2)
Sodium: 143 mmol/L (ref 134–144)
eGFR: 52 mL/min/{1.73_m2} — ABNORMAL LOW (ref >59)

## 2021-02-17 LAB — HEMOGLOBIN A1C
Est. average glucose Bld gHb Est-mCnc: 123 mg/dL
Hgb A1c MFr Bld: 5.9 % — ABNORMAL HIGH (ref 4.8–5.6)

## 2021-02-17 LAB — TSH: TSH: 2.51 u[IU]/mL (ref 0.450–4.500)

## 2021-02-17 LAB — T4, FREE: Free T4: 1.53 ng/dL (ref 0.82–1.77)

## 2021-02-22 ENCOUNTER — Other Ambulatory Visit: Payer: Self-pay

## 2021-02-22 ENCOUNTER — Ambulatory Visit
Admission: RE | Admit: 2021-02-22 | Discharge: 2021-02-22 | Disposition: A | Discharge status: Home / Self Care | Payer: Medicare PPO | Source: Ambulatory Visit | Attending: Family Medicine | Admitting: Family Medicine

## 2021-02-22 DIAGNOSIS — R1032 Left lower quadrant pain: Secondary | ICD-10-CM | POA: Insufficient documentation

## 2021-02-22 MED ORDER — IOHEXOL 350 MG/ML SOLN
75.0000 mL | Freq: Once | INTRAVENOUS | Status: AC | PRN
  Administered 2021-02-22: 75 mL via INTRAVENOUS

## 2021-02-23 NOTE — Progress Notes (Signed)
Results show the following 1.  Thyroid level is relatively higher, I need to know if she is taking any vitamins containing biotin 2.  Blood sugars are in the prediabetic range, need to work on weight loss 3.  Cholesterol and triglycerides are borderline high, no medication needed 4.  Kidney test is relatively better than before 5.  Calcium is normal Please schedule follow-up for 3 months

## 2021-03-02 MED ORDER — LEVOTHYROXINE SODIUM 50 MCG PO TABS: 50.0000 ug | ORAL_TABLET | Freq: Every day | ORAL | 3 refills | Status: DC

## 2021-03-02 NOTE — Addendum Note (Signed)
Addended by: Cinda Quest on: 03/02/2021 03:17 PM   Modules accepted: Orders

## 2021-03-15 ENCOUNTER — Ambulatory Visit: Payer: Medicare PPO | Admitting: Psychiatry

## 2021-03-22 ENCOUNTER — Telehealth: Payer: Self-pay

## 2021-03-22 DIAGNOSIS — G4701 Insomnia due to medical condition: Secondary | ICD-10-CM

## 2021-03-22 DIAGNOSIS — F3130 Bipolar disorder, current episode depressed, mild or moderate severity, unspecified: Secondary | ICD-10-CM

## 2021-03-22 DIAGNOSIS — F411 Generalized anxiety disorder: Secondary | ICD-10-CM

## 2021-03-22 MED ORDER — LAMOTRIGINE 25 MG PO TABS: ORAL_TABLET | ORAL | 1 refills | Status: DC

## 2021-03-22 MED ORDER — PRAZOSIN HCL 2 MG PO CAPS: 2.0000 mg | ORAL_CAPSULE | Freq: Every day | ORAL | 1 refills | Status: DC

## 2021-03-22 NOTE — Telephone Encounter (Signed)
I have sent Lamictal 25 mg as well as prazosin 2 mg to pharmacy.

## 2021-03-22 NOTE — Telephone Encounter (Signed)
Medication refill requests - Faxes for refills of patient's Prazosin and Lamictal received, last provided 01/14/21 + 1 refill. Pt. returns for appt 03/24/21.

## 2021-03-24 ENCOUNTER — Ambulatory Visit: Payer: Medicare PPO | Admitting: Psychiatry

## 2021-03-28 ENCOUNTER — Telehealth (INDEPENDENT_AMBULATORY_CARE_PROVIDER_SITE_OTHER): Payer: Medicare PPO | Admitting: Psychiatry

## 2021-03-28 ENCOUNTER — Encounter: Payer: Self-pay | Admitting: Psychiatry

## 2021-03-28 ENCOUNTER — Other Ambulatory Visit: Payer: Self-pay

## 2021-03-28 DIAGNOSIS — F3176 Bipolar disorder, in full remission, most recent episode depressed: Secondary | ICD-10-CM

## 2021-03-28 DIAGNOSIS — G4701 Insomnia due to medical condition: Secondary | ICD-10-CM | POA: Diagnosis not present

## 2021-03-28 DIAGNOSIS — F411 Generalized anxiety disorder: Secondary | ICD-10-CM

## 2021-03-28 MED ORDER — ESZOPICLONE 3 MG PO TABS: ORAL_TABLET | ORAL | 1 refills | Status: DC

## 2021-03-28 MED ORDER — LAMOTRIGINE 100 MG PO TABS: 50.0000 mg | ORAL_TABLET | Freq: Two times a day (BID) | ORAL | 0 refills | Status: DC

## 2021-03-28 MED ORDER — ESCITALOPRAM OXALATE 10 MG PO TABS: 15.0000 mg | ORAL_TABLET | Freq: Every day | ORAL | 1 refills | Status: DC

## 2021-03-28 MED ORDER — LURASIDONE HCL 80 MG PO TABS: 80.0000 mg | ORAL_TABLET | Freq: Every day | ORAL | 1 refills | Status: DC

## 2021-03-28 NOTE — Progress Notes (Signed)
Virtual Visit via Video Note  I connected with Kaylee Gordon on 03/28/21 at  4:30 PM EDT by a video enabled telemedicine application and verified that I am speaking with the correct person using two identifiers.  Location Provider Location : [facility] Patient Location : Home  Participants: Patient , Provider    I discussed the limitations of evaluation and management by telemedicine and the availability of in person appointments. The patient expressed understanding and agreed to proceed.    I discussed the assessment and treatment plan with the patient. The patient was provided an opportunity to ask questions and all were answered. The patient agreed with the plan and demonstrated an understanding of the instructions.   The patient was advised to call back or seek an in-person evaluation if the symptoms worsen or if the condition fails to improve as anticipated.   [place] MD OP Progress Note  03/28/2021 5:01 PM Kaylee Gordon  MRN:  [id number]  Chief Complaint:  Chief Complaint   Follow-up; Depression    HPI: Kaylee Gordon is a 60 year old Caucasian female who has a history of bipolar disorder, GAD, fibromyalgia, married, lives in [place] was evaluated by telemedicine today.  Patient reports she is currently going through a flareup of her IBS, struggles with nausea as well as abdominal cramps.  She reports she however is trying to eat at least a small portion of her meals.  She agrees to get in touch with her primary care provider.  She reports since completing [facility] therapy she has been feeling better.  Denies any significant sadness, crying spells.  Denies suicidal thoughts.  Reports sleep is overall okay.  Her energy level has improved.  She has been doing some activities outside of her home.  Recently went to the beach with a friend.  Had a good time.  Patient denies any perceptual disturbances.  She is compliant on medications.  Denies side effects.  Continues to be in  psychotherapy sessions and reports therapy sessions is beneficial.  Patient denies any other concerns today.  Visit Diagnosis:    ICD-10-CM   1. Bipolar disorder, in full remission, most recent episode depressed (HCC)  F31.76 lamoTRIgine (LAMICTAL) 100 MG tablet    2. GAD (generalized anxiety disorder)  F41.1 escitalopram (LEXAPRO) 10 MG tablet    3. Insomnia due to medical condition  G47.01 lurasidone (LATUDA) 80 MG TABS tablet    Eszopiclone 3 MG TABS   mood, pain,anxiety    4. Insomnia due to medical condition  G47.01 lurasidone (LATUDA) 80 MG TABS tablet    Eszopiclone 3 MG TABS   pain,mood, anxiety      Past Psychiatric History: Reviewed past psychiatric history from progress note on 09/11/2018  Past Medical History:  Past Medical History:  Diagnosis Date   Anxiety    Asthma    B12 deficiency    Cancer ([place])    SKIN CANCER   CHF (congestive heart failure) ([place])    Depression    Hypertension    Thyroid disease     Past Surgical History:  Procedure Laterality Date   ANKLE SURGERY Right 2012   APPENDECTOMY     AUGMENTATION MAMMAPLASTY Bilateral    BREAST ENHANCEMENT SURGERY     COLONOSCOPY WITH PROPOFOL N/A 02/22/2015   Procedure: COLONOSCOPY WITH PROPOFOL;  Surgeon: Josefine Class, MD;  Location: [facility] ENDOSCOPY;  Service: Endoscopy;  Laterality: N/A;   ESOPHAGOGASTRODUODENOSCOPY (EGD) WITH PROPOFOL N/A 02/22/2015   Procedure: ESOPHAGOGASTRODUODENOSCOPY (EGD) WITH PROPOFOL;  Surgeon: Josefine Class, MD;  Location: [facility] ENDOSCOPY;  Service: Endoscopy;  Laterality: N/A;   ESOPHAGOGASTRODUODENOSCOPY (EGD) WITH PROPOFOL N/A 02/11/2016   Procedure: ESOPHAGOGASTRODUODENOSCOPY (EGD) WITH PROPOFOL;  Surgeon: Manya Silvas, MD;  Location: [facility] ENDOSCOPY;  Service: Endoscopy;  Laterality: N/A;   HEMORRHOID SURGERY     NASAL SINUS SURGERY     SAVORY DILATION N/A 02/22/2015   Procedure: SAVORY DILATION;  Surgeon: Josefine Class, MD;  Location: [facility] ENDOSCOPY;   Service: Endoscopy;  Laterality: N/A;   SAVORY DILATION N/A 02/11/2016   Procedure: SAVORY DILATION;  Surgeon: Manya Silvas, MD;  Location: [facility] ENDOSCOPY;  Service: Endoscopy;  Laterality: N/A;   SKIN CANCER EXCISION      Family Psychiatric History: Reviewed family psychiatric history from progress note on 09/11/2018  Family History:  Family History  Problem Relation Age of Onset   Cancer Mother        colon cancer   Depression Mother    Anxiety disorder Mother    Heart disease Father    Cancer Father        ? neck cancer- still living.    Anxiety disorder Father    Depression Father    Hypoparathyroidism Son    Colon cancer Maternal Aunt        died in 60s.     Social History: Reviewed social history from progress note on 09/11/2018 Social History   Socioeconomic History   Marital status: Married    Spouse name: Legrand Como   Number of children: Not on file   Years of education: Not on file   Highest education level: Not on file  Occupational History   Not on file  Tobacco Use   Smoking status: Never   Smokeless tobacco: Never  Vaping Use   Vaping Use: Not on file  Substance and Sexual Activity   Alcohol use: No    Alcohol/week: 0.0 standard drinks    Comment: MAYBE ONCE OR TWICE A MONTH   Drug use: No   Sexual activity: Not Currently    Partners: Male  Other Topics Concern   Not on file  Social History Narrative   Lives in [place]; taught high school- math; retd; no smoking; social alcohol.    Social Determinants of Health   Financial Resource Strain: Not on file  Food Insecurity: Not on file  Transportation Needs: Not on file  Physical Activity: Not on file  Stress: Not on file  Social Connections: Not on file    Allergies:  Allergies  Allergen Reactions   Levofloxacin Other (See Comments) and Nausea Only    Lethargic, Flu-like symptoms, Hot flashes    Atorvastatin    Hydrochlorothiazide Other (See Comments)    Intolerance - dry lips    Parafon Forte Dsc [Chlorzoxazone]    Pollen Extract    Sudafed [Pseudoephedrine Hcl]     Heart racing    Metabolic Disorder Labs: Lab Results  Component Value Date   HGBA1C 5.9 (H) 02/16/2021   Lab Results  Component Value Date   PROLACTIN 18.3 07/22/2014   Lab Results  Component Value Date   CHOL 184 02/16/2021   TRIG 179 (H) 02/16/2021   HDL 37 (L) 02/16/2021   CHOLHDL 5.0 (H) 02/16/2021   LDLCALC 115 (H) 02/16/2021   Lab Results  Component Value Date   TSH 2.510 02/16/2021   TSH 1.01 08/05/2020    Therapeutic Level Labs: No results found for: LITHIUM Lab Results  Component Value Date  VALPROATE 39 (L) 02/04/2019   No components found for:  CBMZ  Current Medications: Current Outpatient Medications  Medication Sig Dispense Refill   albuterol (VENTOLIN HFA) 108 (90 Base) MCG/ACT inhaler Inhale into the lungs.     azelastine (ASTELIN) 0.1 % nasal spray Place into the nose.     calcitRIOL (ROCALTROL) 0.25 MCG capsule Take 1 capsule (0.25 mcg total) by mouth daily. 90 capsule 1   escitalopram (LEXAPRO) 10 MG tablet Take 1.5 tablets (15 mg total) by mouth daily. 45 tablet 1   esomeprazole (NEXIUM) 40 MG capsule Take by mouth.     Eszopiclone 3 MG TABS TAKE ONE TABLET BY MOUTH IMMEDIATELY BEFORE BEDTIME 30 tablet 1   fenofibrate 160 MG tablet Take by mouth.     fenofibrate 160 MG tablet Take 1 tablet by mouth daily.     fluticasone (FLONASE) 50 MCG/ACT nasal spray      gabapentin (NEURONTIN) 300 MG capsule Take 300 mg by mouth. 2 CAP AM, 1 CAP Q noon, 2 CAP pm,     HYDROcodone bit-homatropine (HYCODAN) 5-1.5 MG/5ML syrup hydrocodone-homatropine 5 mg-1.5 mg/5 mL oral syrup (Patient not taking: Reported on 02/03/2021)     hydrOXYzine (VISTARIL) 50 MG capsule Take 1 capsule (50 mg total) by mouth 2 (two) times daily as needed for anxiety. 60 capsule 1   lamoTRIgine (LAMICTAL) 100 MG tablet Take 0.5 tablets (50 mg total) by mouth 2 (two) times daily. 90 tablet 0    lamoTRIgine (LAMICTAL) 25 MG tablet TAKE ONE TABLET BY MOUTH DAILY ALONG WITH LAMICTAL '100MG'$  DAILY 30 tablet 1   levothyroxine (SYNTHROID) 50 MCG tablet Take 1 tablet (50 mcg total) by mouth daily. 90 tablet 3   lisinopril (ZESTRIL) 10 MG tablet Take 10 mg by mouth daily.     lovastatin (MEVACOR) 20 MG tablet Take 20 mg by mouth at bedtime.     lurasidone (LATUDA) 80 MG TABS tablet Take 1 tablet (80 mg total) by mouth daily with breakfast. 30 tablet 1   ondansetron (ZOFRAN-ODT) 4 MG disintegrating tablet      pantoprazole (PROTONIX) 40 MG tablet Take 1 tablet (40 mg total) by mouth daily. 30 tablet 1   prazosin (MINIPRESS) 2 MG capsule Take 1 capsule (2 mg total) by mouth at bedtime. 30 capsule 1   predniSONE (DELTASONE) 10 MG tablet 4 tabs po daily x 3 days, 3 tabs po daily x 3 days, 2 tabs po daily x 3 days, 1 tab po daily x 3 days (Patient not taking: Reported on 02/03/2021)     propranolol (INNOPRAN XL) 80 MG 24 hr capsule Take by mouth.     propranolol ER (INDERAL LA) 80 MG 24 hr capsule      vitamin B-12 (CYANOCOBALAMIN) 1000 MCG tablet Take by mouth.      No current facility-administered medications for this visit.     Musculoskeletal: Strength & Muscle Tone:  UTA Gait & Station: normal Patient leans: N/A  Psychiatric Specialty Exam: Review of Systems  Gastrointestinal:  Positive for nausea.       Abdomen cramping  Psychiatric/Behavioral:  The patient is nervous/anxious.   All other systems reviewed and are negative.  There were no vitals taken for this visit.There is no height or weight on file to calculate BMI.  General Appearance: Casual  Eye Contact:  Fair  Speech:  Clear and Coherent  Volume:  Normal  Mood:  Anxious coping well  Affect:  Congruent  Thought Process:  Goal Directed and  Descriptions of Associations: Intact  Orientation:  Full (Time, Place, and Person)  Thought Content: Logical   Suicidal Thoughts:  No  Homicidal Thoughts:  No  Memory:  Immediate;    Fair Recent;   Fair Remote;   Fair  Judgement:  Fair  Insight:  Fair  Psychomotor Activity:  Normal  Concentration:  Concentration: Fair and Attention Span: Fair  Recall:  [employer] of Knowledge: Fair  Language: Fair  Akathisia:  No  Handed:  Right  AIMS (if indicated): done  Assets:  Communication Skills Desire for Improvement Housing Intimacy Social Support Transportation  ADL's:  Intact  Cognition: WNL  Sleep:  Fair   Screenings: PHQ2-9    Flowsheet Row Video Visit from 02/10/2021 in [facility] Video Visit from 01/25/2021 in [facility] Video Visit from 12/15/2020 in [facility] Video Visit from 10/13/2020 in [facility] Video Visit from 09/20/2020 in [facility]  PHQ-2 Total Score '2 3 4 5 4  '$ PHQ-9 Total Score '11 11 15 20 '$ --      Flowsheet Row Video Visit from 09/20/2020 in [facility] No Risk        Assessment and Plan: Kaylee Gordon is a 60 year old Caucasian female, married, lives in [place], has a history of bipolar disorder, anxiety disorder, fibromyalgia, IBS was evaluated by telemedicine today.  Patient completed [facility] therapy reports good response, currently depression is stable.  Does report IBS flareup and agrees to follow up with primary care provider.  Plan as noted below.  Plan Bipolar disorder in remission Lamotrigine 125 mg p.o. daily divided dosage Latuda 80 mg p.o. daily Lexapro 15 mg p.o. daily Lunesta 3 mg p.o. nightly Continue CBT with Ms. Miguel Dibble   GAD-improving Lexapro 15 mg p.o. daily Continue CBT Hydroxyzine 50 mg p.o. twice daily as needed Will not recommend benzodiazepine therapy for this patient due to history of misusing or overusing.  Insomnia-stable Prazosin 2 mg p.o. nightly Lunesta 3 mg p.o. nightly  Patient with IBS flareup,  advised to follow up with primary care provider.  Follow-up in clinic in 2 months in person.  This note was generated in part or whole with voice recognition software. Voice recognition is usually quite accurate but there are transcription errors that can and very often do occur. I apologize for any typographical errors that were not detected and corrected.     Ursula Alert, MD 03/29/2021, 10:15 AM

## 2021-04-07 ENCOUNTER — Other Ambulatory Visit: Payer: Self-pay | Admitting: Family Medicine

## 2021-04-07 DIAGNOSIS — Z1231 Encounter for screening mammogram for malignant neoplasm of breast: Secondary | ICD-10-CM

## 2021-04-11 ENCOUNTER — Telehealth: Payer: Self-pay

## 2021-04-11 DIAGNOSIS — G4701 Insomnia due to medical condition: Secondary | ICD-10-CM

## 2021-04-11 MED ORDER — HYDROXYZINE PAMOATE 50 MG PO CAPS: 50.0000 mg | ORAL_CAPSULE | Freq: Two times a day (BID) | ORAL | 1 refills | Status: DC | PRN

## 2021-04-11 NOTE — Telephone Encounter (Signed)
I have sent hydroxyzine to pharmacy. 

## 2021-04-11 NOTE — Telephone Encounter (Signed)
Medication refill request - Patient left a message she is in need of a new Hydroxyzine order, last provided 02/10/21 + 1 refill.  Pt. last evaluated on 03/28/21 and continued at that time and returns next on 05/24/21.  Patient stated she is going out of town later this week and would like to get the refill prior to then as she only has a few days remaining.

## 2021-04-13 ENCOUNTER — Ambulatory Visit: Payer: Self-pay | Admitting: Dermatology

## 2021-04-22 ENCOUNTER — Other Ambulatory Visit: Payer: Self-pay

## 2021-04-22 ENCOUNTER — Ambulatory Visit
Admission: RE | Admit: 2021-04-22 | Discharge: 2021-04-22 | Disposition: A | Discharge status: Home / Self Care | Payer: Medicare PPO | Source: Ambulatory Visit | Attending: Family Medicine | Admitting: Family Medicine

## 2021-04-22 DIAGNOSIS — Z1231 Encounter for screening mammogram for malignant neoplasm of breast: Secondary | ICD-10-CM | POA: Insufficient documentation

## 2021-04-28 ENCOUNTER — Other Ambulatory Visit: Payer: Self-pay | Admitting: Family Medicine

## 2021-04-28 DIAGNOSIS — R928 Other abnormal and inconclusive findings on diagnostic imaging of breast: Secondary | ICD-10-CM

## 2021-05-06 ENCOUNTER — Ambulatory Visit
Admission: RE | Admit: 2021-05-06 | Discharge: 2021-05-06 | Disposition: A | Discharge status: Home / Self Care | Payer: Medicare PPO | Source: Ambulatory Visit | Attending: Family Medicine | Admitting: Family Medicine

## 2021-05-06 ENCOUNTER — Other Ambulatory Visit: Payer: Self-pay

## 2021-05-06 DIAGNOSIS — R928 Other abnormal and inconclusive findings on diagnostic imaging of breast: Secondary | ICD-10-CM

## 2021-05-14 ENCOUNTER — Other Ambulatory Visit: Payer: Self-pay | Admitting: Endocrinology

## 2021-05-20 ENCOUNTER — Other Ambulatory Visit: Payer: Self-pay | Admitting: Psychiatry

## 2021-05-20 DIAGNOSIS — F3130 Bipolar disorder, current episode depressed, mild or moderate severity, unspecified: Secondary | ICD-10-CM

## 2021-05-20 DIAGNOSIS — F411 Generalized anxiety disorder: Secondary | ICD-10-CM

## 2021-05-23 ENCOUNTER — Other Ambulatory Visit: Payer: Self-pay | Admitting: Psychiatry

## 2021-05-23 DIAGNOSIS — G4701 Insomnia due to medical condition: Secondary | ICD-10-CM

## 2021-05-24 ENCOUNTER — Encounter: Payer: Self-pay | Admitting: Psychiatry

## 2021-05-24 ENCOUNTER — Telehealth (INDEPENDENT_AMBULATORY_CARE_PROVIDER_SITE_OTHER): Payer: Medicare PPO | Admitting: Psychiatry

## 2021-05-24 ENCOUNTER — Other Ambulatory Visit: Payer: Self-pay

## 2021-05-24 DIAGNOSIS — F411 Generalized anxiety disorder: Secondary | ICD-10-CM | POA: Diagnosis not present

## 2021-05-24 DIAGNOSIS — F3176 Bipolar disorder, in full remission, most recent episode depressed: Secondary | ICD-10-CM | POA: Diagnosis not present

## 2021-05-24 DIAGNOSIS — G4701 Insomnia due to medical condition: Secondary | ICD-10-CM | POA: Diagnosis not present

## 2021-05-24 DIAGNOSIS — Z9189 Other specified personal risk factors, not elsewhere classified: Secondary | ICD-10-CM

## 2021-05-24 MED ORDER — OLANZAPINE 2.5 MG PO TABS: 2.5000 mg | ORAL_TABLET | Freq: Every day | ORAL | 0 refills | Status: DC

## 2021-05-24 NOTE — Patient Instructions (Signed)
Olanzapine Tablets What is this medication? OLANZAPINE (oh LAN za peen) treats schizophrenia and bipolar disorder. It works by balancing the levels of dopamine and serotonin in your brain, substances that help regulate mood, behaviors, and thoughts. It belongs to a group of medications called antipsychotics. Antipsychotic medications can be used to treat several kinds of mental health conditions. This medicine may be used for other purposes; ask your health care provider or pharmacist if you have questions. COMMON BRAND NAME(S): Zyprexa What should I tell my care team before I take this medication? They need to know if you have any of these conditions: Blockage in your bowel Constipation Dementia Diabetes Difficulty swallowing Glaucoma Have trouble controlling your muscles Heart disease High cholesterol High levels of prolactin History of breast cancer History of irregular heartbeat History of stroke Liver disease Low blood cell levels, such as low white cell, platelet, or red cell levels Low blood pressure Parkinson disease Prostate disease Seizures Suicidal thoughts, plans or attempt; a previous suicide attempt by you or a family member Tobacco use Trouble passing urine An unusual or allergic reaction to olanzapine, other medications, foods, dyes, or preservatives Pregnant or trying to get pregnant Breast-feeding How should I use this medication? Take this medication by mouth. Swallow it with a drink of water. Follow the directions on the prescription label. Take your medication at regular intervals. Do not take it more often than directed. Do not stop taking except on the advice of your care team. A special MedGuide will be given to you by the pharmacist with each new prescription and refill. Be sure to read this information carefully each time. Talk to your care team about the use of this medication in children. While this medication may be prescribed for children as young as  13 years for selected conditions, precautions do apply. Overdosage: If you think you have taken too much of this medicine contact a poison control center or emergency room at once. NOTE: This medicine is only for you. Do not share this medicine with others. What if I miss a dose? If you miss a dose, take it as soon as you can. If it is almost time for your next dose, take only that dose. Do not take double or extra doses. What may interact with this medication? Do not take this medication with any of the following: Dronedarone Cisapride Metoclopramide Pimozide Thioridazine This medication may also interact with the following: Alcohol Antihistamines for allergy, cough, and cold Atropine Carbamazepine Certain medications for anxiety or sleep Certain medications for bladder problems like oxybutynin, tolterodine Certain medications for depression like amitriptyline, fluoxetine, sertraline Certain medications for stomach problems like dicyclomine, hyoscyamine Certain medications for travel sickness like scopolamine Fluvoxamine General anesthetics like halothane, isoflurane, methoxyflurane, propofol Levodopa or other medications for Parkinson's disease Medications for blood pressure Medications for seizures Medications that relax muscles for surgery Narcotic medications for pain Omeprazole Other medications that prolong the QT interval (an abnormal heart rhythm) Phenothiazines like chlorpromazine, prochlorperazine Rifampin This list may not describe all possible interactions. Give your health care provider a list of all the medicines, herbs, non-prescription drugs, or dietary supplements you use. Also tell them if you smoke, drink alcohol, or use illegal drugs. Some items may interact with your medicine. What should I watch for while using this medication? Visit your care team for regular checks on your progress. Tell your care team if symptoms do not start to get better or if they get  worse. Do not stop taking except on   your care team's advice. You may develop a severe reaction. Your care team will tell you how much medication to take. This medication may cause serious skin reactions. They can happen weeks to months after starting the medication. Contact your care team right away if you notice fevers or flu-like symptoms with a rash. The rash may be red or purple and then turn into blisters or peeling of the skin. Or, you might notice a red rash with swelling of the face, lips or lymph nodes in your neck or under your arms. You may get dizzy or drowsy. Do not drive, use machinery, or do anything that needs mental alertness until you know how this medication affects you. Do not stand or sit up quickly, especially if you are an older patient. This reduces the risk of dizzy or fainting spells. Alcohol may interfere with the effect of this medication. Avoid alcoholic drinks. Your mouth may get dry. Chewing sugarless gum or sucking hard candy, and drinking plenty of water will help. This medication can cause problems with controlling your body temperature. It can lower the response of your body to cold temperatures. If possible, stay indoors during cold weather. If you must go outdoors, wear warm clothes. It can also lower the response of your body to heat. Do not overheat. Do not over-exercise. Stay out of the sun when possible. If you must be in the sun, wear cool clothing. Drink plenty of water. If you have trouble controlling your body temperature, call your care team right away. This medication may increase blood sugar. Ask your care team if changes in diet or medications are needed if you have diabetes. If you smoke, tell your care team if you notice this medication is not working well for you. Talk to your care team if you are a smoker or if you decide to stop smoking. What side effects may I notice from receiving this medication? Side effects that you should report to your care team as  soon as possible: Allergic reactions--skin rash, itching, hives, swelling of the face, lips, tongue, or throat High blood sugar (hyperglycemia)--increased thirst or amount of urine, unusual weakness or fatigue, blurry vision High fever, stiff muscles, increased sweating, fast or irregular heartbeat, and confusion, which may be signs of neuroleptic malignant syndrome High prolactin level--unexpected breast tissue growth, discharge from the nipple, change in sex drive or performance, irregular menstrual cycle Infection--fever, chills, cough, or sore throat Low blood pressure--dizziness, feeling faint or lightheaded, blurry vision Pain or trouble swallowing Seizures Stroke--sudden numbness or weakness of the face, arm, or leg, trouble speaking, confusion, trouble walking, loss of balance or coordination, dizziness, severe headache, change in vision Thoughts of suicide or self-harm, worsening mood, feelings of depression Trouble passing urine Uncontrolled and repetitive body movements, muscle stiffness or spasms, tremors or shaking, loss of balance or coordination, restlessness, shuffling walk, which may be signs of extrapyramidal symptoms (EPS) Side effects that usually do not require medical attention (report to your care team if they continue or are bothersome): Constipation Dizziness Drowsiness Dry mouth Headache Upset stomach Weight gain This list may not describe all possible side effects. Call your doctor for medical advice about side effects. You may report side effects to FDA at 1-800-FDA-1088. Where should I keep my medication? Keep out of the reach of children. Store at controlled room temperature between 15 and 30 degrees C (59 and 86 degrees F). Protect from light and moisture. Throw away any unused medication after the expiration date. NOTE: This   sheet is a summary. It may not cover all possible information. If you have questions about this medicine, talk to your doctor,  pharmacist, or health care provider.  2022 Elsevier/Gold Standard (2021-03-24 00:00:00)

## 2021-05-24 NOTE — Progress Notes (Signed)
Virtual Visit via Video Note  I connected with Kaylee Gordon on 05/24/21 at  2:40 PM EST by a video enabled telemedicine application and verified that I am speaking with the correct person using two identifiers.  Location Provider Location : [facility] Patient Location : Home  Participants: Patient , Provider    I discussed the limitations of evaluation and management by telemedicine and the availability of in person appointments. The patient expressed understanding and agreed to proceed.    I discussed the assessment and treatment plan with the patient. The patient was provided an opportunity to ask questions and all were answered. The patient agreed with the plan and demonstrated an understanding of the instructions.   The patient was advised to call back or seek an in-person evaluation if the symptoms worsen or if the condition fails to improve as anticipated.  [facility] MD OP Progress Note  05/24/2021 2:57 PM KARALINA TIFT  MRN:  [id number]  Chief Complaint:  Chief Complaint   Follow-up; Anxiety    HPI: Kaylee Gordon is a 60 year old Caucasian female who has a history of bipolar disorder, GAD, fibromyalgia, married, lives in [place] was evaluated by telemedicine today.  Patient today reports she is currently struggling with anxiety, racing thoughts and sleep problems.  She reports she does have psychosocial stressors, her father with whom she does not have a good relationship with is currently struggling with health problems.  He does have a knee replacement surgery coming up and patient reports she has to visit him this week and that scares her.  She has not seen him in the past 2 years although she is the only child .  She is currently working closely with the therapist on the same.  Patient reports in spite of being on the Lunesta she has not been sleeping well since the past couple of weeks.  She does have hydroxyzine available which she is willing to take during the day for  anxiety.  It helps to some extent.  She is also on Lexapro.  She denies any suicidality, homicidality or perceptual disturbances.  Patient with upper respiratory tract infection symptoms, agrees to follow up with primary care provider.  This likely also affecting her sleep and mood.  Patient denies any other concerns today.  Visit Diagnosis:    ICD-10-CM   1. Bipolar disorder, in full remission, most recent episode depressed ([place])  F31.76 EKG 12-Lead    2. GAD (generalized anxiety disorder)  F41.1 OLANZapine (ZYPREXA) 2.5 MG tablet    EKG 12-Lead    3. Insomnia due to medical condition  G47.01 OLANZapine (ZYPREXA) 2.5 MG tablet    EKG 12-Lead   pain, mood    4. At risk for prolonged QT interval syndrome  Z91.89       Past Psychiatric History: Reviewed past psychiatric history from progress note on 09/11/2018.  Past Medical History:  Past Medical History:  Diagnosis Date   Anxiety    Asthma    B12 deficiency    Cancer ([place])    SKIN CANCER   CHF (congestive heart failure) ([place])    Depression    Hypertension    Thyroid disease     Past Surgical History:  Procedure Laterality Date   ANKLE SURGERY Right 2012   APPENDECTOMY     AUGMENTATION MAMMAPLASTY Bilateral    BREAST ENHANCEMENT SURGERY     COLONOSCOPY WITH PROPOFOL N/A 02/22/2015   Procedure: COLONOSCOPY WITH PROPOFOL;  Surgeon: Josefine Class, MD;  Location: [facility] ENDOSCOPY;  Service: Endoscopy;  Laterality: N/A;   ESOPHAGOGASTRODUODENOSCOPY (EGD) WITH PROPOFOL N/A 02/22/2015   Procedure: ESOPHAGOGASTRODUODENOSCOPY (EGD) WITH PROPOFOL;  Surgeon: Josefine Class, MD;  Location: [facility] ENDOSCOPY;  Service: Endoscopy;  Laterality: N/A;   ESOPHAGOGASTRODUODENOSCOPY (EGD) WITH PROPOFOL N/A 02/11/2016   Procedure: ESOPHAGOGASTRODUODENOSCOPY (EGD) WITH PROPOFOL;  Surgeon: Manya Silvas, MD;  Location: [facility] ENDOSCOPY;  Service: Endoscopy;  Laterality: N/A;   HEMORRHOID SURGERY     NASAL SINUS SURGERY     SAVORY  DILATION N/A 02/22/2015   Procedure: SAVORY DILATION;  Surgeon: Josefine Class, MD;  Location: [facility] ENDOSCOPY;  Service: Endoscopy;  Laterality: N/A;   SAVORY DILATION N/A 02/11/2016   Procedure: SAVORY DILATION;  Surgeon: Manya Silvas, MD;  Location: [facility] ENDOSCOPY;  Service: Endoscopy;  Laterality: N/A;   SKIN CANCER EXCISION      Family Psychiatric History: Reviewed family psychiatric history from progress note on 09/11/2018.  Family History:  Family History  Problem Relation Age of Onset   Cancer Mother        colon cancer   Depression Mother    Anxiety disorder Mother    Heart disease Father    Cancer Father        ? neck cancer- still living.    Anxiety disorder Father    Depression Father    Hypoparathyroidism Son    Colon cancer Maternal Aunt        died in 39s.     Social History: Reviewed social history from progress note on 09/11/2018. Social History   Socioeconomic History   Marital status: Married    Spouse name: Legrand Como   Number of children: Not on file   Years of education: Not on file   Highest education level: Not on file  Occupational History   Not on file  Tobacco Use   Smoking status: Never   Smokeless tobacco: Never  Vaping Use   Vaping Use: Not on file  Substance and Sexual Activity   Alcohol use: No    Alcohol/week: 0.0 standard drinks    Comment: MAYBE ONCE OR TWICE A MONTH   Drug use: No   Sexual activity: Not Currently    Partners: Male  Other Topics Concern   Not on file  Social History Narrative   Lives in [place]; taught high school- math; retd; no smoking; social alcohol.    Social Determinants of Health   Financial Resource Strain: Not on file  Food Insecurity: Not on file  Transportation Needs: Not on file  Physical Activity: Not on file  Stress: Not on file  Social Connections: Not on file    Allergies:  Allergies  Allergen Reactions   Levofloxacin Other (See Comments) and Nausea Only    Lethargic, Flu-like  symptoms, Hot flashes    Atorvastatin    Hydrochlorothiazide Other (See Comments)    Intolerance - dry lips   Parafon Forte Dsc [Chlorzoxazone]    Pollen Extract    Sudafed [Pseudoephedrine Hcl]     Heart racing    Metabolic Disorder Labs: Lab Results  Component Value Date   HGBA1C 5.9 (H) 02/16/2021   Lab Results  Component Value Date   PROLACTIN 18.3 07/22/2014   Lab Results  Component Value Date   CHOL 184 02/16/2021   TRIG 179 (H) 02/16/2021   HDL 37 (L) 02/16/2021   CHOLHDL 5.0 (H) 02/16/2021   LDLCALC 115 (H) 02/16/2021   Lab Results  Component Value Date   TSH 2.510  02/16/2021   TSH 1.01 08/05/2020    Therapeutic Level Labs: No results found for: LITHIUM Lab Results  Component Value Date   VALPROATE 39 (L) 02/04/2019   No components found for:  CBMZ  Current Medications: Current Outpatient Medications  Medication Sig Dispense Refill   OLANZapine (ZYPREXA) 2.5 MG tablet Take 1 tablet (2.5 mg total) by mouth at bedtime. 10 tablet 0   albuterol (VENTOLIN HFA) 108 (90 Base) MCG/ACT inhaler Inhale into the lungs.     azelastine (ASTELIN) 0.1 % nasal spray Place into the nose.     calcitRIOL (ROCALTROL) 0.25 MCG capsule TAKE ONE CAPSULE BY MOUTH DAILY 90 capsule 1   escitalopram (LEXAPRO) 10 MG tablet Take 1.5 tablets (15 mg total) by mouth daily. 45 tablet 1   esomeprazole (NEXIUM) 40 MG capsule Take by mouth.     Eszopiclone 3 MG TABS TAKE ONE TABLET BY MOUTH IMMEDIATELY BEFORE BEDTIME 30 tablet 1   fenofibrate 160 MG tablet Take by mouth.     fenofibrate 160 MG tablet Take 1 tablet by mouth daily.     fluticasone (FLONASE) 50 MCG/ACT nasal spray      gabapentin (NEURONTIN) 300 MG capsule Take 300 mg by mouth. 2 CAP AM, 1 CAP Q noon, 2 CAP pm,     HYDROcodone bit-homatropine (HYCODAN) 5-1.5 MG/5ML syrup hydrocodone-homatropine 5 mg-1.5 mg/5 mL oral syrup (Patient not taking: Reported on 02/03/2021)     hydrOXYzine (VISTARIL) 50 MG capsule Take 1 capsule (50  mg total) by mouth 2 (two) times daily as needed for anxiety. 60 capsule 1   lamoTRIgine (LAMICTAL) 100 MG tablet Take 0.5 tablets (50 mg total) by mouth 2 (two) times daily. 90 tablet 0   lamoTRIgine (LAMICTAL) 25 MG tablet TAKE ONE TABLET BY MOUTH DAILY ALONG WITH THE LAMOTRIGINE 100MG  30 tablet 1   levothyroxine (SYNTHROID) 50 MCG tablet Take 1 tablet (50 mcg total) by mouth daily. 90 tablet 3   lisinopril (ZESTRIL) 10 MG tablet Take 10 mg by mouth daily.     lovastatin (MEVACOR) 20 MG tablet Take 20 mg by mouth at bedtime.     ondansetron (ZOFRAN-ODT) 4 MG disintegrating tablet      pantoprazole (PROTONIX) 40 MG tablet Take 1 tablet (40 mg total) by mouth daily. 30 tablet 1   prazosin (MINIPRESS) 2 MG capsule TAKE ONE CAPSULE BY MOUTH EVERY NIGHT AT BEDTIME 30 capsule 1   predniSONE (DELTASONE) 10 MG tablet 4 tabs po daily x 3 days, 3 tabs po daily x 3 days, 2 tabs po daily x 3 days, 1 tab po daily x 3 days (Patient not taking: Reported on 02/03/2021)     propranolol (INNOPRAN XL) 80 MG 24 hr capsule Take by mouth.     propranolol ER (INDERAL LA) 80 MG 24 hr capsule      vitamin B-12 (CYANOCOBALAMIN) 1000 MCG tablet Take by mouth.      No current facility-administered medications for this visit.     Musculoskeletal: Strength & Muscle Tone: UTA Gait & Station: normal Patient leans: N/A  Psychiatric Specialty Exam: Review of Systems  HENT:  Positive for congestion, rhinorrhea and sneezing.   Psychiatric/Behavioral:  Positive for sleep disturbance. The patient is nervous/anxious.   All other systems reviewed and are negative.  There were no vitals taken for this visit.There is no height or weight on file to calculate BMI.  General Appearance: Casual  Eye Contact:  Fair  Speech:  Clear and Coherent  Volume:  Normal  Mood:  Anxious  Affect:  Congruent  Thought Process:  Goal Directed and Descriptions of Associations: Intact  Orientation:  Full (Time, Place, and Person)  Thought  Content: Logical   Suicidal Thoughts:  No  Homicidal Thoughts:  No  Memory:  Immediate;   Fair Recent;   Fair Remote;   Fair  Judgement:  Fair  Insight:  Good  Psychomotor Activity:  Normal  Concentration:  Concentration: Fair and Attention Span: Fair  Recall:  [employer] of Knowledge: Fair  Language: Fair  Akathisia:  No  Handed:  Right  AIMS (if indicated): done, 0  Assets:  Communication Skills Desire for Improvement Housing Social Support Transportation Vocational/Educational  ADL's:  Intact  Cognition: WNL  Sleep:  Poor   Screenings: PHQ2-9    Flowsheet Row Video Visit from 02/10/2021 in [place] Video Visit from 01/25/2021 in [place] Video Visit from 12/15/2020 in [place] Video Visit from 10/13/2020 in [place] Video Visit from 09/20/2020 in [place]  PHQ-2 Total Score 2 3 4 5 4   PHQ-9 Total Score 11 11 15 20  --      Flowsheet Row Video Visit from 09/20/2020 in [place] No Risk        Assessment and Plan: Kaylee Gordon is a 60 year old Caucasian female, married, lives in [place], has a history of bipolar disorder, GAD, fibromyalgia, IBS was evaluated by telemedicine today.  Patient completed [facility] with good response however currently struggles with anxiety and sleep problems.  Discussed plan as noted below.  Plan Bipolar disorder in remission Lamotrigine 125 mg p.o. daily Lexapro 15 mg p.o. daily Lunesta 3 mg p.o. nightly Discontinue Latuda and replace with olanzapine for sleep and anxiety. Continue CBT with Ms. Miguel Dibble   GAD-unstable Start olanzapine 2.5 mg p.o. nightly Patient to make use of hydroxyzine 50 mg p.o. twice daily as needed for anxiety Continue CBT with Ms. Miguel Dibble. Lexapro 15 mg p.o. daily.  We will consider  changing Lexapro to another SSRI or SNRI if she continues to be anxious. Will not recommend benzodiazepine therapy for this patient due to history of misusing or overusing  Insomnia-unstable Prazosin 2 mg p.o. nightly Olanzapine as noted above. Patient to use Lunesta as needed only since she is currently on olanzapine  At risk for prolonged QT syndrome-we will order EKG-provided phone #[phone].  Patient to follow up with primary care provider for her upper respiratory tract infection symptoms.  Follow-up in clinic in 10 days or sooner as needed   This note was generated in part or whole with voice recognition software. Voice recognition is usually quite accurate but there are transcription errors that can and very often do occur. I apologize for any typographical errors that were not detected and corrected.      Ursula Alert, MD 05/24/2021, 2:57 PM

## 2021-05-26 ENCOUNTER — Other Ambulatory Visit: Payer: Self-pay | Admitting: Psychiatry

## 2021-05-26 DIAGNOSIS — F411 Generalized anxiety disorder: Secondary | ICD-10-CM

## 2021-05-30 ENCOUNTER — Other Ambulatory Visit: Payer: Self-pay | Admitting: Psychiatry

## 2021-05-30 DIAGNOSIS — F411 Generalized anxiety disorder: Secondary | ICD-10-CM

## 2021-05-30 DIAGNOSIS — G4701 Insomnia due to medical condition: Secondary | ICD-10-CM

## 2021-05-31 ENCOUNTER — Ambulatory Visit
Admission: RE | Admit: 2021-05-31 | Discharge: 2021-05-31 | Disposition: A | Discharge status: Home / Self Care | Payer: Medicare PPO | Source: Ambulatory Visit | Attending: Psychiatry | Admitting: Psychiatry

## 2021-05-31 ENCOUNTER — Ambulatory Visit: Payer: Medicare PPO

## 2021-05-31 ENCOUNTER — Other Ambulatory Visit: Payer: Self-pay

## 2021-05-31 DIAGNOSIS — F3176 Bipolar disorder, in full remission, most recent episode depressed: Secondary | ICD-10-CM | POA: Insufficient documentation

## 2021-05-31 DIAGNOSIS — F411 Generalized anxiety disorder: Secondary | ICD-10-CM | POA: Diagnosis not present

## 2021-05-31 DIAGNOSIS — G4701 Insomnia due to medical condition: Secondary | ICD-10-CM | POA: Insufficient documentation

## 2021-06-01 ENCOUNTER — Telehealth: Payer: Self-pay

## 2021-06-01 ENCOUNTER — Telehealth (INDEPENDENT_AMBULATORY_CARE_PROVIDER_SITE_OTHER): Payer: Medicare PPO | Admitting: Psychiatry

## 2021-06-01 ENCOUNTER — Encounter: Payer: Self-pay | Admitting: Psychiatry

## 2021-06-01 DIAGNOSIS — G4701 Insomnia due to medical condition: Secondary | ICD-10-CM

## 2021-06-01 DIAGNOSIS — I4581 Long QT syndrome: Secondary | ICD-10-CM | POA: Diagnosis not present

## 2021-06-01 DIAGNOSIS — F411 Generalized anxiety disorder: Secondary | ICD-10-CM

## 2021-06-01 DIAGNOSIS — F3176 Bipolar disorder, in full remission, most recent episode depressed: Secondary | ICD-10-CM | POA: Diagnosis not present

## 2021-06-01 MED ORDER — CLONAZEPAM 0.5 MG PO TABS: 0.2500 mg | ORAL_TABLET | Freq: Two times a day (BID) | ORAL | 0 refills | Status: DC | PRN

## 2021-06-01 MED ORDER — ESZOPICLONE 3 MG PO TABS: ORAL_TABLET | ORAL | 1 refills | Status: DC

## 2021-06-01 MED ORDER — ESCITALOPRAM OXALATE 10 MG PO TABS: 15.0000 mg | ORAL_TABLET | Freq: Every day | ORAL | 1 refills | Status: DC

## 2021-06-01 NOTE — Telephone Encounter (Signed)
copy of ekgs' faxed and confirmed to the pcp and also a referral was sent to [facility] for differnece in EKG.

## 2021-06-01 NOTE — Progress Notes (Signed)
Virtual Visit via Video Note  I connected with Kaylee Gordon on 06/01/21 at  8:45 AM EST by a video enabled telemedicine application and verified that I am speaking with the correct person using two identifiers.  Location Provider Location : [facility] Patient Location : Home  Participants: Patient , Provider    I discussed the limitations of evaluation and management by telemedicine and the availability of in person appointments. The patient expressed understanding and agreed to proceed.   I discussed the assessment and treatment plan with the patient. The patient was provided an opportunity to ask questions and all were answered. The patient agreed with the plan and demonstrated an understanding of the instructions.   The patient was advised to call back or seek an in-person evaluation if the symptoms worsen or if the condition fails to improve as anticipated.  Video connection was lost at less than 50% of the duration of the visit, at which time the remainder of the visit was completed through audio only      [facility] MD OP Progress Note  06/01/2021 12:35 PM Kaylee Gordon  MRN:  [id number]  Chief Complaint:  Chief Complaint   Follow-up; Anxiety; Depression    HPI: Kaylee Gordon is a 60 year old Caucasian female who has a history of bipolar disorder, GAD, fibromyalgia, married, lives in [place], was evaluated by telemedicine today.  Patient was evaluated on 05/24/2021.  Patient today returns for a follow-up visit.  Due to connection problem the evaluation had to be done by telephone.  Patient today reports she continues to have anxiety, racing thoughts.  She reports she has good days and bad days.  Patient had an EKG completed on 05/31/2021-reviewed and discussed EKG changes, patient with prolonged QT.  Patient was recently started on olanzapine and she is also on multiple other medications like hydroxyzine, Lexapro which could also cause prolonged QT syndrome.  This was  discussed with patient.  Patient will benefit from a cardiology clearance since she is on multiple psychotropic medication with QT prolongation effects.  Patient denies any suicidality, homicidality or perceptual disturbances.  Patient continues to be in psychotherapy sessions which are beneficial.  Patient denies any other concerns today.  Visit Diagnosis:    ICD-10-CM   1. Bipolar disorder, in full remission, most recent episode depressed ([place])  F31.76     2. GAD (generalized anxiety disorder)  F41.1 escitalopram (LEXAPRO) 10 MG tablet    clonazePAM (KLONOPIN) 0.5 MG tablet    3. Insomnia due to medical condition  G47.01 Eszopiclone 3 MG TABS   pain,mood, anxiety    4. Prolonged QT syndrome  I45.81       Past Psychiatric History: Reviewed past psychiatric history from progress note on 09/11/2018.  Patient recently completed [place]  Past Medical History:  Past Medical History:  Diagnosis Date   Anxiety    Asthma    B12 deficiency    Cancer ([place])    SKIN CANCER   CHF (congestive heart failure) ([place])    Depression    Hypertension    Thyroid disease     Past Surgical History:  Procedure Laterality Date   ANKLE SURGERY Right 2012   APPENDECTOMY     AUGMENTATION MAMMAPLASTY Bilateral    BREAST ENHANCEMENT SURGERY     COLONOSCOPY WITH PROPOFOL N/A 02/22/2015   Procedure: COLONOSCOPY WITH PROPOFOL;  Surgeon: Josefine Class, MD;  Location: [facility] ENDOSCOPY;  Service: Endoscopy;  Laterality: N/A;   ESOPHAGOGASTRODUODENOSCOPY (EGD) WITH PROPOFOL N/A 02/22/2015  Procedure: ESOPHAGOGASTRODUODENOSCOPY (EGD) WITH PROPOFOL;  Surgeon: Josefine Class, MD;  Location[facility] ENDOSCOPY;  Service: Endoscopy;  Laterality: N/A;   ESOPHAGOGASTRODUODENOSCOPY (EGD) WITH PROPOFOL N/A 02/11/2016   Procedure: ESOPHAGOGASTRODUODENOSCOPY (EGD) WITH PROPOFOL;  Surgeon: Manya Silvas, MD;  Location: [facility] ENDOSCOPY;  Service: Endoscopy;  Laterality: N/A;   HEMORRHOID SURGERY     NASAL SINUS SURGERY      SAVORY DILATION N/A 02/22/2015   Procedure: SAVORY DILATION;  Surgeon: Josefine Class, MD;  Location: [facility] ENDOSCOPY;  Service: Endoscopy;  Laterality: N/A;   SAVORY DILATION N/A 02/11/2016   Procedure: SAVORY DILATION;  Surgeon: Manya Silvas, MD;  Location: [facility] ENDOSCOPY;  Service: Endoscopy;  Laterality: N/A;   SKIN CANCER EXCISION      Family Psychiatric History: Reviewed family psychiatric history from progress note on 09/11/2018  Family History:  Family History  Problem Relation Age of Onset   Cancer Mother        colon cancer   Depression Mother    Anxiety disorder Mother    Heart disease Father    Cancer Father        ? neck cancer- still living.    Anxiety disorder Father    Depression Father    Hypoparathyroidism Son    Colon cancer Maternal Aunt        died in 49s.     Social History: Reviewed social history from progress note on 09/11/2018 Social History   Socioeconomic History   Marital status: Married    Spouse name: Legrand Como   Number of children: Not on file   Years of education: Not on file   Highest education level: Not on file  Occupational History   Not on file  Tobacco Use   Smoking status: Never   Smokeless tobacco: Never  Vaping Use   Vaping Use: Not on file  Substance and Sexual Activity   Alcohol use: No    Alcohol/week: 0.0 standard drinks    Comment: MAYBE ONCE OR TWICE A MONTH   Drug use: No   Sexual activity: Not Currently    Partners: Male  Other Topics Concern   Not on file  Social History Narrative   Lives in [place]; taught high school- math; retd; no smoking; social alcohol.    Social Determinants of Health   Financial Resource Strain: Not on file  Food Insecurity: Not on file  Transportation Needs: Not on file  Physical Activity: Not on file  Stress: Not on file  Social Connections: Not on file    Allergies:  Allergies  Allergen Reactions   Levofloxacin Other (See Comments) and Nausea Only    Lethargic,  Flu-like symptoms, Hot flashes    Atorvastatin    Hydrochlorothiazide Other (See Comments)    Intolerance - dry lips   Parafon Forte Dsc [Chlorzoxazone]    Pollen Extract    Sudafed [Pseudoephedrine Hcl]     Heart racing    Metabolic Disorder Labs: Lab Results  Component Value Date   HGBA1C 5.9 (H) 02/16/2021   Lab Results  Component Value Date   PROLACTIN 18.3 07/22/2014   Lab Results  Component Value Date   CHOL 184 02/16/2021   TRIG 179 (H) 02/16/2021   HDL 37 (L) 02/16/2021   CHOLHDL 5.0 (H) 02/16/2021   LDLCALC 115 (H) 02/16/2021   Lab Results  Component Value Date   TSH 2.510 02/16/2021   TSH 1.01 08/05/2020    Therapeutic Level Labs: No results found for: LITHIUM Lab Results  Component Value Date   VALPROATE 39 (L) 02/04/2019   No components found for:  CBMZ  Current Medications: Current Outpatient Medications  Medication Sig Dispense Refill   clonazePAM (KLONOPIN) 0.5 MG tablet Take 0.5 tablets (0.25 mg total) by mouth 2 (two) times daily as needed for anxiety. 30 tablet 0   albuterol (VENTOLIN HFA) 108 (90 Base) MCG/ACT inhaler Inhale into the lungs.     azelastine (ASTELIN) 0.1 % nasal spray Place into the nose.     calcitRIOL (ROCALTROL) 0.25 MCG capsule TAKE ONE CAPSULE BY MOUTH DAILY 90 capsule 1   escitalopram (LEXAPRO) 10 MG tablet Take 1.5 tablets (15 mg total) by mouth daily. Hold the lexapro until cardiology clearance 45 tablet 1   esomeprazole (NEXIUM) 40 MG capsule Take by mouth.     Eszopiclone 3 MG TABS TAKE ONE TABLET BY MOUTH IMMEDIATELY BEFORE BEDTIME 30 tablet 1   fenofibrate 160 MG tablet Take by mouth.     fenofibrate 160 MG tablet Take 1 tablet by mouth daily.     fluticasone (FLONASE) 50 MCG/ACT nasal spray      gabapentin (NEURONTIN) 300 MG capsule Take 300 mg by mouth. 2 CAP AM, 1 CAP Q noon, 2 CAP pm,     HYDROcodone bit-homatropine (HYCODAN) 5-1.5 MG/5ML syrup hydrocodone-homatropine 5 mg-1.5 mg/5 mL oral syrup (Patient not  taking: Reported on 02/03/2021)     lamoTRIgine (LAMICTAL) 100 MG tablet Take 0.5 tablets (50 mg total) by mouth 2 (two) times daily. 90 tablet 0   lamoTRIgine (LAMICTAL) 25 MG tablet TAKE ONE TABLET BY MOUTH DAILY ALONG WITH THE LAMOTRIGINE 100MG  30 tablet 1   levothyroxine (SYNTHROID) 50 MCG tablet Take 1 tablet (50 mcg total) by mouth daily. 90 tablet 3   lisinopril (ZESTRIL) 10 MG tablet Take 10 mg by mouth daily.     lovastatin (MEVACOR) 20 MG tablet Take 20 mg by mouth at bedtime.     ondansetron (ZOFRAN-ODT) 4 MG disintegrating tablet      pantoprazole (PROTONIX) 40 MG tablet Take 1 tablet (40 mg total) by mouth daily. 30 tablet 1   prazosin (MINIPRESS) 2 MG capsule TAKE ONE CAPSULE BY MOUTH EVERY NIGHT AT BEDTIME 30 capsule 1   predniSONE (DELTASONE) 10 MG tablet 4 tabs po daily x 3 days, 3 tabs po daily x 3 days, 2 tabs po daily x 3 days, 1 tab po daily x 3 days (Patient not taking: Reported on 02/03/2021)     propranolol (INNOPRAN XL) 80 MG 24 hr capsule Take by mouth.     propranolol ER (INDERAL LA) 80 MG 24 hr capsule      vitamin B-12 (CYANOCOBALAMIN) 1000 MCG tablet Take by mouth.      No current facility-administered medications for this visit.     Musculoskeletal: Strength & Muscle Tone:  UTA Gait & Station:  UTA Patient leans: N/A  Psychiatric Specialty Exam: Review of Systems  Psychiatric/Behavioral:         Mood swings  All other systems reviewed and are negative.  There were no vitals taken for this visit.There is no height or weight on file to calculate BMI.  General Appearance:  UTA  Eye Contact:   UTA  Speech:  Clear and Coherent  Volume:  Normal  Mood:   Mood swings  Affect:   UTA  Thought Process:  Goal Directed and Descriptions of Associations: Intact  Orientation:  Full (Time, Place, and Person)  Thought Content: Rumination   Suicidal Thoughts:  No  Homicidal Thoughts:  No  Memory:  Immediate;   Fair Recent;   Fair Remote;   Fair  Judgement:  Fair   Insight:  Fair  Psychomotor Activity:   UTA  Concentration:  Concentration: Fair and Attention Span: Fair  Recall:  [employer] of Knowledge: Fair  Language: Fair  Akathisia:  No  Handed:  Right  AIMS (if indicated): done, 0  Assets:  Communication Skills Desire for Improvement Housing Talents/Skills  ADL's:  Intact  Cognition: WNL  Sleep:   fair   Screenings: PHQ2-9    Flowsheet Row Video Visit from 02/10/2021 in [place] Video Visit from 01/25/2021 in [place] Video Visit from 12/15/2020 in [place] Video Visit from 10/13/2020 in [place] Video Visit from 09/20/2020 in [place]  PHQ-2 Total Score 2 3 4 5 4   PHQ-9 Total Score 11 11 15 20  --      Flowsheet Row Video Visit from 09/20/2020 in [place] No Risk        Assessment and Plan: Kaylee Gordon is a 60 year old Caucasian female, married, lives in [place], has a history of bipolar disorder, GAD, fibromyalgia, IBS was evaluated by telemedicine today.  Patient with recent EKG abnormality, will benefit from the following plan.  Plan Bipolar disorder in remission Lamotrigine 125 mg p.o. daily Lunesta 3 mg p.o. nightly Continue CBT with Ms. Miguel Dibble  GAD-unstable Hold olanzapine, hydroxyzine and Lexapro due to her prolonged QT Start Klonopin 0.25 mg p.o. twice daily as needed for severe anxiety, patient advised to limit use Reviewed [place] PMP aware  Insomnia-improving Prazosin 2 mg p.o. nightly Lunesta 3 mg p.o. nightly  Prolonged QT syndrome-reviewed EKG dated 05/31/2021-patient with prolonged QT. Will hold olanzapine, hydroxyzine and Lexapro Refer patient to cardiologist for cardiac clearance prior to restarting the psychotropic medications. I will also coordinate care with primary care provider, since  she is on other medications which can have an impact .  Follow-up in clinic in 2 weeks or sooner in person.   I have spent at least 30 minutes non face to face with patient today.    This note was generated in part or whole with voice recognition software. Voice recognition is usually quite accurate but there are transcription errors that can and very often do occur. I apologize for any typographical errors that were not detected and corrected.        Ursula Alert, MD 06/02/2021, 9:10 AM

## 2021-06-02 DIAGNOSIS — R9431 Abnormal electrocardiogram [ECG] [EKG]: Secondary | ICD-10-CM | POA: Insufficient documentation

## 2021-06-06 ENCOUNTER — Other Ambulatory Visit (INDEPENDENT_AMBULATORY_CARE_PROVIDER_SITE_OTHER): Payer: Medicare PPO

## 2021-06-06 ENCOUNTER — Encounter: Payer: Self-pay | Admitting: Endocrinology

## 2021-06-06 ENCOUNTER — Encounter: Payer: Self-pay | Admitting: Psychiatry

## 2021-06-06 ENCOUNTER — Ambulatory Visit: Payer: Medicare PPO | Admitting: Endocrinology

## 2021-06-06 ENCOUNTER — Ambulatory Visit (INDEPENDENT_AMBULATORY_CARE_PROVIDER_SITE_OTHER): Payer: Medicare PPO | Admitting: Psychiatry

## 2021-06-06 ENCOUNTER — Ambulatory Visit: Payer: Medicare PPO | Admitting: Psychiatry

## 2021-06-06 ENCOUNTER — Other Ambulatory Visit: Payer: Self-pay

## 2021-06-06 VITALS — BP 151/84 | HR 87 | Temp 97.9°F | Wt 181.6 lb

## 2021-06-06 VITALS — BP 138/78 | HR 78 | Ht 64.5 in | Wt 182.4 lb

## 2021-06-06 DIAGNOSIS — E200 Idiopathic hypoparathyroidism: Secondary | ICD-10-CM | POA: Diagnosis not present

## 2021-06-06 DIAGNOSIS — R030 Elevated blood-pressure reading, without diagnosis of hypertension: Secondary | ICD-10-CM

## 2021-06-06 DIAGNOSIS — E038 Other specified hypothyroidism: Secondary | ICD-10-CM

## 2021-06-06 DIAGNOSIS — G4701 Insomnia due to medical condition: Secondary | ICD-10-CM

## 2021-06-06 DIAGNOSIS — R7301 Impaired fasting glucose: Secondary | ICD-10-CM

## 2021-06-06 DIAGNOSIS — F411 Generalized anxiety disorder: Secondary | ICD-10-CM | POA: Diagnosis not present

## 2021-06-06 DIAGNOSIS — F3132 Bipolar disorder, current episode depressed, moderate: Secondary | ICD-10-CM | POA: Diagnosis not present

## 2021-06-06 DIAGNOSIS — I4581 Long QT syndrome: Secondary | ICD-10-CM | POA: Diagnosis not present

## 2021-06-06 LAB — BASIC METABOLIC PANEL
BUN: 16 mg/dL (ref 6–23)
CO2: 26 mEq/L (ref 19–32)
Calcium: 9.1 mg/dL (ref 8.4–10.5)
Chloride: 106 mEq/L (ref 96–112)
Creatinine, Ser: 1.18 mg/dL (ref 0.40–1.20)
GFR: 49.77 mL/min — ABNORMAL LOW (ref >60.00)
Glucose, Bld: 121 mg/dL — ABNORMAL HIGH (ref 70–99)
Potassium: 4.1 mEq/L (ref 3.5–5.1)
Sodium: 140 mEq/L (ref 135–145)

## 2021-06-06 LAB — T4, FREE: Free T4: 0.93 ng/dL (ref 0.60–1.60)

## 2021-06-06 MED ORDER — ESCITALOPRAM OXALATE 10 MG PO TABS: 10.0000 mg | ORAL_TABLET | Freq: Every day | ORAL | 0 refills | Status: DC

## 2021-06-06 MED ORDER — LAMOTRIGINE 150 MG PO TABS: 75.0000 mg | ORAL_TABLET | Freq: Two times a day (BID) | ORAL | 1 refills | Status: DC

## 2021-06-06 NOTE — Progress Notes (Signed)
[facility] MD OP Progress Note  06/06/2021 11:39 AM Kaylee Gordon  MRN:  [id number]  Chief Complaint:  Chief Complaint   Follow-up; Depression; Anxiety    HPI: Kaylee Gordon is a 60 year old Caucasian female who has a history of bipolar disorder, GAD, fibromyalgia, married, lives in [place] was evaluated in office today.  Patient recently had EKG changes and was referred to cardiology.  Patient had cardiology consultation on 06/02/2021.  Patient with prolonged QT syndrome, patient was advised per cardiology to start one of her medications back and return for a follow-up cardiac evaluation after a week of starting a medication.  Patient today continues to have sadness, anxiety symptoms.  Patient was prescribed Klonopin and reports since she had stopped the Lexapro she has been taking it every single day.  She does have psychosocial stressors of relationship problems with her dad who is currently struggling with medical problems, her daughter who had a shooting incident happened at her [place].  Patient is currently trying to support her daughter which has been overwhelming for her.  Patient continues to follow-up with her therapist.  She follows up with Ms. Miguel Dibble.  Patient denies any suicidality, homicidality or perceptual disturbances.  Patient with elevated blood pressure reading in session today reports that she had taken her blood pressure this morning.  She is in pain today due to her fibromyalgia which does have an impact on her mood.  Patient denies any other concerns today.  Visit Diagnosis:    ICD-10-CM   1. Bipolar 1 disorder, depressed, moderate (HCC)  F31.32 lamoTRIgine (LAMICTAL) 150 MG tablet    2. GAD (generalized anxiety disorder)  F41.1 escitalopram (LEXAPRO) 10 MG tablet    lamoTRIgine (LAMICTAL) 150 MG tablet    3. Insomnia due to medical condition  G47.01    pain, mood    4. Prolonged QT syndrome  I45.81     5. Elevated blood pressure reading  R03.0        Past Psychiatric History: I have reviewed past psychiatric history from progress note on 09/11/2018  Past Medical History:  Past Medical History:  Diagnosis Date   Anxiety    Asthma    B12 deficiency    Cancer ([place])    SKIN CANCER   CHF (congestive heart failure) ([place])    Depression    Hypertension    Thyroid disease     Past Surgical History:  Procedure Laterality Date   ANKLE SURGERY Right 2012   APPENDECTOMY     AUGMENTATION MAMMAPLASTY Bilateral    BREAST ENHANCEMENT SURGERY     COLONOSCOPY WITH PROPOFOL N/A 02/22/2015   Procedure: COLONOSCOPY WITH PROPOFOL;  Surgeon: Josefine Class, MD;  Location[facility] ENDOSCOPY;  Service: Endoscopy;  Laterality: N/A;   ESOPHAGOGASTRODUODENOSCOPY (EGD) WITH PROPOFOL N/A 02/22/2015   Procedure: ESOPHAGOGASTRODUODENOSCOPY (EGD) WITH PROPOFOL;  Surgeon: Josefine Class, MD;  Location: [facility] ENDOSCOPY;  Service: Endoscopy;  Laterality: N/A;   ESOPHAGOGASTRODUODENOSCOPY (EGD) WITH PROPOFOL N/A 02/11/2016   Procedure: ESOPHAGOGASTRODUODENOSCOPY (EGD) WITH PROPOFOL;  Surgeon: Manya Silvas, MD;  Location: [facility] ENDOSCOPY;  Service: Endoscopy;  Laterality: N/A;   HEMORRHOID SURGERY     NASAL SINUS SURGERY     SAVORY DILATION N/A 02/22/2015   Procedure: SAVORY DILATION;  Surgeon: Josefine Class, MD;  Location: [facility] ENDOSCOPY;  Service: Endoscopy;  Laterality: N/A;   SAVORY DILATION N/A 02/11/2016   Procedure: SAVORY DILATION;  Surgeon: Manya Silvas, MD;  Location: [facility] ENDOSCOPY;  Service: Endoscopy;  Laterality:  N/A;   SKIN CANCER EXCISION      Family Psychiatric History: Reviewed family psychiatric history from progress note on 09/11/2018  Family History:  Family History  Problem Relation Age of Onset   Cancer Mother        colon cancer   Depression Mother    Anxiety disorder Mother    Heart disease Father    Cancer Father        ? neck cancer- still living.    Anxiety disorder Father    Depression Father     Hypoparathyroidism Son    Colon cancer Maternal Aunt        died in 67s.     Social History: Reviewed social history from progress note on 09/11/2018 Social History   Socioeconomic History   Marital status: Married    Spouse name: Legrand Como   Number of children: Not on file   Years of education: Not on file   Highest education level: Not on file  Occupational History   Not on file  Tobacco Use   Smoking status: Never   Smokeless tobacco: Never  Vaping Use   Vaping Use: Not on file  Substance and Sexual Activity   Alcohol use: No    Alcohol/week: 0.0 standard drinks    Comment: MAYBE ONCE OR TWICE A MONTH   Drug use: No   Sexual activity: Not Currently    Partners: Male  Other Topics Concern   Not on file  Social History Narrative   Lives in [place]; taught high school- math; retd; no smoking; social alcohol.    Social Determinants of Health   Financial Resource Strain: Not on file  Food Insecurity: Not on file  Transportation Needs: Not on file  Physical Activity: Not on file  Stress: Not on file  Social Connections: Not on file    Allergies:  Allergies  Allergen Reactions   Levofloxacin Other (See Comments) and Nausea Only    Lethargic, Flu-like symptoms, Hot flashes    Atorvastatin    Hydrochlorothiazide Other (See Comments)    Intolerance - dry lips   Parafon Forte Dsc [Chlorzoxazone]    Pollen Extract    Sudafed [Pseudoephedrine Hcl]     Heart racing    Metabolic Disorder Labs: Lab Results  Component Value Date   HGBA1C 5.9 (H) 02/16/2021   Lab Results  Component Value Date   PROLACTIN 18.3 07/22/2014   Lab Results  Component Value Date   CHOL 184 02/16/2021   TRIG 179 (H) 02/16/2021   HDL 37 (L) 02/16/2021   CHOLHDL 5.0 (H) 02/16/2021   LDLCALC 115 (H) 02/16/2021   Lab Results  Component Value Date   TSH 2.510 02/16/2021   TSH 1.01 08/05/2020    Therapeutic Level Labs: No results found for: LITHIUM Lab Results  Component Value  Date   VALPROATE 39 (L) 02/04/2019   No components found for:  CBMZ  Current Medications: Current Outpatient Medications  Medication Sig Dispense Refill   albuterol (VENTOLIN HFA) 108 (90 Base) MCG/ACT inhaler Inhale into the lungs.     calcitRIOL (ROCALTROL) 0.25 MCG capsule TAKE ONE CAPSULE BY MOUTH DAILY 90 capsule 1   clonazePAM (KLONOPIN) 0.5 MG tablet Take 0.5 tablets (0.25 mg total) by mouth 2 (two) times daily as needed for anxiety. 30 tablet 0   escitalopram (LEXAPRO) 10 MG tablet Take 1 tablet (10 mg total) by mouth daily. Reduced dose 30 tablet 0   esomeprazole (NEXIUM) 40 MG capsule Take by mouth.  Eszopiclone 3 MG TABS TAKE ONE TABLET BY MOUTH IMMEDIATELY BEFORE BEDTIME 30 tablet 1   fenofibrate 160 MG tablet Take by mouth.     fenofibrate 160 MG tablet Take 1 tablet by mouth daily.     fluticasone (FLONASE) 50 MCG/ACT nasal spray      gabapentin (NEURONTIN) 300 MG capsule Take 300 mg by mouth. 2 CAP AM, 1 CAP Q noon, 2 CAP pm,     HYDROcodone bit-homatropine (HYCODAN) 5-1.5 MG/5ML syrup      lamoTRIgine (LAMICTAL) 150 MG tablet Take 0.5 tablets (75 mg total) by mouth 2 (two) times daily. 30 tablet 1   levothyroxine (SYNTHROID) 50 MCG tablet Take 1 tablet (50 mcg total) by mouth daily. 90 tablet 3   lisinopril (ZESTRIL) 10 MG tablet Take 10 mg by mouth daily.     lovastatin (MEVACOR) 20 MG tablet Take 20 mg by mouth at bedtime.     ondansetron (ZOFRAN-ODT) 4 MG disintegrating tablet      pantoprazole (PROTONIX) 40 MG tablet Take 1 tablet (40 mg total) by mouth daily. 30 tablet 1   prazosin (MINIPRESS) 2 MG capsule TAKE ONE CAPSULE BY MOUTH EVERY NIGHT AT BEDTIME 30 capsule 1   predniSONE (DELTASONE) 10 MG tablet      propranolol (INNOPRAN XL) 80 MG 24 hr capsule Take by mouth.     propranolol ER (INDERAL LA) 80 MG 24 hr capsule      vitamin B-12 (CYANOCOBALAMIN) 1000 MCG tablet Take by mouth.      azelastine (ASTELIN) 0.1 % nasal spray Place into the nose.     MAXITROL  3.5-10000-0.1 ophthalmic suspension Place 1 drop into the left eye 4 (four) times daily.     No current facility-administered medications for this visit.     Musculoskeletal: Strength & Muscle Tone: within normal limits Gait & Station: normal Patient leans: N/A  Psychiatric Specialty Exam: Review of Systems  Musculoskeletal:  Positive for myalgias.  Psychiatric/Behavioral:  Positive for dysphoric mood. The patient is nervous/anxious.   All other systems reviewed and are negative.  Blood pressure (!) 151/84, pulse 87, temperature 97.9 F (36.6 C), temperature source Temporal, weight 181 lb 9.6 oz (82.4 kg).Body mass index is 30.69 kg/m.  General Appearance: Casual  Eye Contact:  Fair  Speech:  Clear and Coherent  Volume:  Normal  Mood:  Anxious and Depressed  Affect:  Congruent  Thought Process:  Goal Directed and Descriptions of Associations: Intact  Orientation:  Full (Time, Place, and Person)  Thought Content: Logical   Suicidal Thoughts:  No  Homicidal Thoughts:  No  Memory:  Immediate;   Fair Recent;   Fair Remote;   Fair  Judgement:  Fair  Insight:  Fair  Psychomotor Activity:  Normal  Concentration:  Concentration: Fair and Attention Span: Fair  Recall:  [employer] of Knowledge: Fair  Language: Fair  Akathisia:  No  Handed:  Right  AIMS (if indicated): done, 0  Assets:  Communication Skills Desire for Improvement Housing Social Support  ADL's:  Intact  Cognition: WNL  Sleep:  Fair   Screenings: PHQ2-9    [place] Office Visit from 06/06/2021 in [place] Video Visit from 02/10/2021 in [place] Video Visit from 01/25/2021 in [place] Video Visit from 12/15/2020 in [place] Video Visit from 10/13/2020 in [place]  PHQ-2 Total Score 2 2 3 4 5   PHQ-9 Total Score 7 11 11  [place] Visit from 06/06/2021 in [place] Video Visit from 09/20/2020 in [place] No Risk No Risk        Assessment and Plan: RATASHA FABRE is a 60 year old Caucasian female, married, lives in [place], has a history of bipolar disorder, GAD, fibromyalgia, IBS was evaluated in office today.  Patient is currently being followed by cardiology for recent EKG abnormalities, continues to hold her olanzapine, Lexapro and hydroxyzine, returns today with depression and anxiety symptoms worsening.  She will benefit from following plan.  Plan Bipolar disorder-unstable Restart Lexapro at a lower dosage of 10 mg p.o. daily.  Since Lexapro could also have an impact on her QT prolongation, patient advised to contact cardiology and go back for a follow-up in a week from now. Increase lamotrigine to 150 mg p.o. daily in divided dosage Continue Lunesta 3 mg p.o. nightly Continue CBT with Ms. Miguel Dibble  GAD-unstable Restart Lexapro at a lower dose of 10 mg p.o. daily Continue Klonopin 0.5 mg as needed however advised patient to not to use it regularly and to limit use. Klonopin had to be restarted since patient had prolonged QT syndrome and could not use the hydroxyzine.  Insomnia-improving Prazosin 2 mg p.o. nightly Lunesta 3 mg p.o. nightly Continued sleep hygiene techniques  Prolonged QT syndrome-unstable Reviewed notes per cardiology-Ms.Drane - 05/31/2021-patient with prolonged QT, 'advised to follow up with cardiology if she were to restart her psychotropic medication '. Patient to follow up in a week with cardiology since her Lexapro has been restarted although at a lower dosage.  Elevated blood pressure reading-unstable Patient to keep a log of her blood pressure, follow up with cardiology.  Patient to follow up with her primary care provider for her fibromyalgia.    Follow-up in clinic in 2 to 3  weeks or sooner in person.  This note was generated in part or whole with voice recognition software. Voice recognition is usually quite accurate but there are transcription errors that can and very often do occur. I apologize for any typographical errors that were not detected and corrected.       Ursula Alert, MD 06/06/2021, 11:39 AM

## 2021-06-06 NOTE — Patient Instructions (Signed)
Long QT Syndrome Long QT syndrome (LQTS) is a heart condition in which the heart takes longer than normal to recharge after each heartbeat. This is caused by an abnormal electrical system in the heart. LQTS can upset the timing of your heartbeats. It can cause dangerous changes in your heart rate and rhythm (arrhythmia). There are several types of LQTS. The three most common types are: Type 1. This can be triggered by stress or exercise, especially swimming. Type 2. This can be triggered by strong emotions or surprise. Type 3. This can be triggered when your heart slows during sleep. You can be born with LQTS, or you can develop it later in life. What are the causes? The cause of this condition depends on the type of LQTS that you have. Inherited LQTS. You are born with this condition. It is caused by an abnormal gene that is passed down through your family. Acquired LQTS. You get this condition later in life. It may be caused by:  Certain medicines that affect your heartbeat. Some examples are methadone and antihistamines. Long periods of vomiting or diarrhea. An eating disorder. A thyroid disorder. What increases the risk? This condition is more likely to develop in: People who are born deaf. Women. People who have an eating disorder, such as anorexia nervosa or bulimia. People who have a family member with LQTS. People who have a family history of unexplained fainting, drowning, or sudden death. What are the signs or symptoms? Symptoms of this condition include: Fainting. A fluttering feeling in your chest. Loud gasping during sleep. Seizures. Symptoms of inherited LQTS almost always start before age 61.  Some people with this condition have no symptoms. How is this diagnosed? This condition may be diagnosed based on: Your symptoms. Your medical history and family history. A physical exam. Some tests, including: An electrocardiogram (ECG) to measure electrical activity in your  heart. Holter monitoring to record your heartbeat for 1-2 days. Stress test to record your heartbeat while you exercise. A blood test to look for genes that cause LQTS. How is this treated? There is no cure for this condition. Treatment depends on the cause, your symptoms, and whether you have a family history of sudden death. Treatment may include: Making lifestyle changes, such as avoiding competitive sports or stressful situations. Taking supplements to correct abnormal salt (sodium), potassium, calcium, and magnesium levels. Stopping the use of a medicine. Do not stop the use of medicines without first talking to your health care provider. Taking heart medicines, such as beta blockers. Implanting a device that corrects a dangerous heartbeat, such as: A pacemaker. This helps your heart beat in a normal rhythm. A cardioverter-defibrillator. This senses a fast heartbeat and shocks the heart to restore normal heart rate. Having heart surgery to prevent arrhythmias. Follow these instructions at home: Medicine Take over-the-counter and prescription medicines only as told by your health care provider. If you want to take any new medicine, get approval from your health care provider first. Avoid any medicines that can cause this condition. Lifestyle Make any lifestyle changes that are recommended by your health care provider. You may need to avoid: Competitive sports. Strenuous exercises and activities, such as swimming. Stress. Situations where sudden loud noises are likely. If you drink alcohol, limit how much you have: 0-2 drinks a day for men. 0-1 drink a day for women. Be aware of how much alcohol is in your drink. In the [place] one drink equals one typical bottle of beer (12 oz), one-half  glass of wine (5 oz), or one shot of hard liquor (1 oz). Do not use any products that contain nicotine or tobacco, such as cigarettes and e-cigarettes. If you need help quitting, ask your health care  provider. General instructions Develop a plan with your health care provider for how to deal with a sudden arrhythmia. Tell people who live with you about the signs of a sudden arrhythmia. Wear a medical ID necklace or bracelet that states your diagnosis and contact information. Have an automated external defibrillator (AED) available at home or work. Get treatment and support if you feel stress, fear, anxiety, or depression. Keep all follow-up visits as told by your health care provider. This is important. Contact a health care provider if: You are suffering from stress, fear, anxiety, or depression. You vomit. You have diarrhea. Get help right away if: You have chest pain or difficulty breathing. You have a fluttering feeling in your chest. You faint. You have a seizure. These symptoms may represent a serious problem that is an emergency. Do not wait to see if the symptoms will go away. Get medical help right away. Call your local emergency services (911 in the [place] Do not drive yourself to the hospital. Summary Long QT syndrome (LQTS) is a heart condition in which your heart takes longer than normal to recharge after each heartbeat. This is caused by an abnormal electrical system in your heart. LQTS can upset the timing of your heartbeats and cause dangerous changes in your heart rate and rhythm (arrhythmia). Some people are born with LQTS (inherited). Others develop it later in life (acquired). Some people with this condition have no symptoms. Those who do have symptoms may experience fainting, a fluttering feeling in the chest, loud gasping during sleep, or seizures. There is no cure for this condition. Treatment depends on the cause, your symptoms, and whether you have a family history of sudden death. This information is not intended to replace advice given to you by your health care provider. Make sure you discuss any questions you have with your health care provider. Document  Revised: 07/30/2017 Document Reviewed: 07/31/2017 Elsevier Patient Education  2022 [employer].

## 2021-06-06 NOTE — Progress Notes (Signed)
Patient ID: Kaylee Gordon, female   DOB: 17-Oct-1960, 60 y.o.   MRN: [id number]    History of Present Illness:  Chief complaint: Endocrinology follow-up   Secondary hypothyroidism:  She was initially given thyroxine supplement with a borderline TSH levels in 2011 However because of complaints of  fatigue on her visit in 10/15 she had thyroid levels done and free T4 was significantly low She was empirically started on 25 g  of levothyroxine Initially she felt less tired with this; however her free T4 continue to be low and her dose had been increased progressively  She is taking 75 mcg levothyroxine when her free T4 was relatively low in 6/21 However because of high normal free T4 in 8/22 the dose is back down to 50 mcg TSH usually stays normal  She has longstanding fatigue which is not improving She is also having decreased motivation She thinks fatigue is related to fibromyalgia No cold intolerance  She is taking her levothyroxine regularly in the morning before breakfast  She takes her calcium in the evening after dinner Labs pending  Wt Readings from Last 3 Encounters:  06/06/21 182 lb 6.4 oz (82.7 kg)  02/03/21 176 lb 9.6 oz (80.1 kg)  08/05/20 176 lb 3.2 oz (79.9 kg)    Lab Results  Component Value Date   FREET4 1.53 02/16/2021   FREET4 0.86 08/05/2020   FREET4 1.01 04/05/2020   TSH 2.510 02/16/2021   TSH 1.01 08/05/2020   TSH 1.14 04/05/2020    Hypoparathyroidism    Has had hypocalcemia since she was 60 years old and presented with symptoms of cramping in her hands along with twitching of her face and eyes. 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   No symptoms of twitching/muscle cramping in her hands or feet, has no tingling or numbness in her face   She is taking calcitriol 0.25 mcg daily in the morning and her calcium twice a day She takes her supplements very regularly  Last calcium normal as  below  Lab Results  Component Value Date   CALCIUM 9.6 02/16/2021   CALCIUM 9.1 04/05/2020   CALCIUM 8.7 (L) 03/12/2020   CALCIUM 9.0 02/24/2020   CALCIUM 9.4 12/17/2019   Lab Results  Component Value Date   K 3.7 02/16/2021      Allergies as of 06/06/2021       Reactions   Levofloxacin Other (See Comments), Nausea Only   Lethargic, Flu-like symptoms, Hot flashes    Atorvastatin    Hydrochlorothiazide Other (See Comments)   Intolerance - dry lips   Parafon Forte Dsc [chlorzoxazone]    Pollen Extract    Sudafed [pseudoephedrine Hcl]    Heart racing        Medication List        Accurate as of June 06, 2021  1:22 PM. If you have any questions, ask your nurse or doctor.          albuterol 108 (90 Base) MCG/ACT inhaler Commonly known as: VENTOLIN HFA Inhale into the lungs.   azelastine 0.1 % nasal spray Commonly known as: ASTELIN Place into the nose.   calcitRIOL 0.25 MCG capsule Commonly known as: ROCALTROL TAKE ONE CAPSULE BY MOUTH DAILY   clonazePAM 0.5 MG tablet Commonly known as: KlonoPIN Take 0.5 tablets (0.25 mg total) by mouth 2 (two) times daily as needed for anxiety.   escitalopram 10 MG tablet Commonly  known as: Lexapro Take 1 tablet (10 mg total) by mouth daily. Reduced dose What changed:  how much to take additional instructions Changed by: Ursula Alert, MD   esomeprazole 40 MG capsule Commonly known as: NEXIUM Take by mouth.   Eszopiclone 3 MG Tabs TAKE ONE TABLET BY MOUTH IMMEDIATELY BEFORE BEDTIME   fenofibrate 160 MG tablet Take by mouth.   fenofibrate 160 MG tablet Take 1 tablet by mouth daily.   fluticasone 50 MCG/ACT nasal spray Commonly known as: FLONASE   gabapentin 300 MG capsule Commonly known as: NEURONTIN Take 300 mg by mouth. 2 CAP AM, 1 CAP Q noon, 2 CAP pm,   HYDROcodone bit-homatropine 5-1.5 MG/5ML syrup Commonly known as: HYCODAN   lamoTRIgine 150 MG tablet Commonly known as: LaMICtal Take 0.5  tablets (75 mg total) by mouth 2 (two) times daily. What changed:  medication strength how much to take Another medication with the same name was removed. Continue taking this medication, and follow the directions you see here. Changed by: Ursula Alert, MD   levothyroxine 50 MCG tablet Commonly known as: SYNTHROID Take 1 tablet (50 mcg total) by mouth daily.   lisinopril 10 MG tablet Commonly known as: ZESTRIL Take 10 mg by mouth daily.   lovastatin 20 MG tablet Commonly known as: MEVACOR Take 20 mg by mouth at bedtime.   Maxitrol 0.1 % ophthalmic suspension Generic drug: neomycin-polymyxin-dexamethasone Place 1 drop into the left eye 4 (four) times daily.   ondansetron 4 MG disintegrating tablet Commonly known as: ZOFRAN-ODT   pantoprazole 40 MG tablet Commonly known as: PROTONIX Take 1 tablet (40 mg total) by mouth daily.   prazosin 2 MG capsule Commonly known as: MINIPRESS TAKE ONE CAPSULE BY MOUTH EVERY NIGHT AT BEDTIME   predniSONE 10 MG tablet Commonly known as: DELTASONE   propranolol 80 MG 24 hr capsule Commonly known as: INNOPRAN XL Take by mouth.   propranolol ER 80 MG 24 hr capsule Commonly known as: INDERAL LA   vitamin B-12 1000 MCG tablet Commonly known as: CYANOCOBALAMIN Take by mouth.        Allergies:  Allergies  Allergen Reactions   Levofloxacin Other (See Comments) and Nausea Only    Lethargic, Flu-like symptoms, Hot flashes    Atorvastatin    Hydrochlorothiazide Other (See Comments)    Intolerance - dry lips   Parafon Forte Dsc [Chlorzoxazone]    Pollen Extract    Sudafed [Pseudoephedrine Hcl]     Heart racing    Past Medical History:  Diagnosis Date   Anxiety    Asthma    B12 deficiency    Cancer ([place])    SKIN CANCER   CHF (congestive heart failure) ([place])    Depression    Hypertension    Thyroid disease     Past Surgical History:  Procedure Laterality Date   ANKLE SURGERY Right 2012   APPENDECTOMY      AUGMENTATION MAMMAPLASTY Bilateral    BREAST ENHANCEMENT SURGERY     COLONOSCOPY WITH PROPOFOL N/A 02/22/2015   Procedure: COLONOSCOPY WITH PROPOFOL;  Surgeon: Josefine Class, MD;  Location[facility] ENDOSCOPY;  Service: Endoscopy;  Laterality: N/A;   ESOPHAGOGASTRODUODENOSCOPY (EGD) WITH PROPOFOL N/A 02/22/2015   Procedure: ESOPHAGOGASTRODUODENOSCOPY (EGD) WITH PROPOFOL;  Surgeon: Josefine Class, MD;  Location: [facility] ENDOSCOPY;  Service: Endoscopy;  Laterality: N/A;   ESOPHAGOGASTRODUODENOSCOPY (EGD) WITH PROPOFOL N/A 02/11/2016   Procedure: ESOPHAGOGASTRODUODENOSCOPY (EGD) WITH PROPOFOL;  Surgeon: Manya Silvas, MD;  Location: [facility] ENDOSCOPY;  Service:  Endoscopy;  Laterality: N/A;   HEMORRHOID SURGERY     NASAL SINUS SURGERY     SAVORY DILATION N/A 02/22/2015   Procedure: SAVORY DILATION;  Surgeon: Josefine Class, MD;  Location: [facility] ENDOSCOPY;  Service: Endoscopy;  Laterality: N/A;   SAVORY DILATION N/A 02/11/2016   Procedure: SAVORY DILATION;  Surgeon: Manya Silvas, MD;  Location: [facility] ENDOSCOPY;  Service: Endoscopy;  Laterality: N/A;   SKIN CANCER EXCISION      Family History  Problem Relation Age of Onset   Cancer Mother        colon cancer   Depression Mother    Anxiety disorder Mother    Heart disease Father    Cancer Father        ? neck cancer- still living.    Anxiety disorder Father    Depression Father    Hypoparathyroidism Son    Colon cancer Maternal Aunt        died in 23s.     Social History:  reports that she has never smoked. She has never used smokeless tobacco. She reports that she does not drink alcohol and does not use drugs.  Review of Systems    History of depression, is managed by psychiatrist  She takes vitamin B12 supplements, last B12 level normal  HYPERLIPIDEMIA: Previously had triglycerides of about 450, repeat levels were better Unclear whether she is taking fenofibrate from her PCP now  Lab Results  Component Value Date   CHOL  184 02/16/2021   Lab Results  Component Value Date   HDL 37 (L) 02/16/2021   Lab Results  Component Value Date   LDLCALC 115 (H) 02/16/2021   Lab Results  Component Value Date   TRIG 179 (H) 02/16/2021   Lab Results  Component Value Date   CHOLHDL 5.0 (H) 02/16/2021   No results found for: LDLDIRECT  PREDIABETES:  Fasting glucose has been higher than normal This was 108 as of 8/22, recently highest random glucose has been 152  She says she is tired and does not have motivation to walk or exercise She eats too much sweets and also recently having more anxiety as well as weight gain  A1c in the past 5.8  Lab Results  Component Value Date   HGBA1C 5.9 (H) 02/16/2021   Lab Results  Component Value Date   LDLCALC 115 (H) 02/16/2021   CREATININE 1.19 (H) 02/16/2021     EXAM:  BP 138/78   Pulse 78   Ht 5' 4.5" (1.638 m)   Wt 182 lb 6.4 oz (82.7 kg)   SpO2 99%   BMI 30.83 kg/m     Assessment/Plan:    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 with 0.25 mcg of calcitriol and one calcium tablet daily Needs follow-up calcium   Secondary HYPOTHYROIDISM: She has had nonspecific fatigue which is likely unrelated to hypothyroidism also Her dosage was reduced on her last visit to 50 mcg and needs follow-up labs Difficult to assess her clinically She has been regular with her supplements  History of high triglycerides, likely metabolic syndrome with prediabetes Discussed insulin resistance and need for weight loss, she says she is unsure whether she is taking fenofibrate from her PCP Discussed importance of exercise and encouraged her to start walking with her husband She needs to also be switched to sugar-free and low-fat desserts and sweets   She will get fasting labs including lipids with her upcoming physical    Elayne Snare 06/06/2021, 1:22  PM

## 2021-06-13 ENCOUNTER — Encounter: Payer: Self-pay | Admitting: Endocrinology

## 2021-06-14 ENCOUNTER — Ambulatory Visit: Payer: Medicare PPO | Admitting: Psychiatry

## 2021-06-14 ENCOUNTER — Encounter: Admission: RE | Payer: Self-pay | Source: Ambulatory Visit

## 2021-06-14 ENCOUNTER — Ambulatory Visit: Admission: RE | Admit: 2021-06-14 | Payer: Medicare PPO | Source: Ambulatory Visit

## 2021-06-14 SURGERY — COLONOSCOPY WITH PROPOFOL
Anesthesia: General

## 2021-06-19 ENCOUNTER — Other Ambulatory Visit: Payer: Self-pay | Admitting: Endocrinology

## 2021-06-28 ENCOUNTER — Other Ambulatory Visit: Payer: Self-pay

## 2021-06-28 ENCOUNTER — Ambulatory Visit: Payer: Medicare PPO | Admitting: Podiatry

## 2021-06-28 DIAGNOSIS — L03031 Cellulitis of right toe: Secondary | ICD-10-CM

## 2021-06-28 MED ORDER — DOXYCYCLINE HYCLATE 100 MG PO TABS: 100.0000 mg | ORAL_TABLET | Freq: Two times a day (BID) | ORAL | 0 refills | Status: AC

## 2021-06-28 NOTE — Progress Notes (Signed)
Subjective:  Patient ID: Kaylee Gordon, female    DOB: 1960/09/30,  MRN: [id number]  Chief Complaint  Patient presents with   Nail Problem    Right foot 5th toe possible infection     60 y.o. female presents with the above complaint.  Patient presents with right fifth digit paronychia after self avulsion of the fifth toenail.  She states that she pulled the nail out.  Since then she noticed discharge has progressive gotten worse.  She is not a diabetic.  She wanted get it evaluated.  She states that there is pain associated with it.  She denies any other acute complaints she denies seeing anyone else prior to seeing me.  She is not taking any antibiotics.   Review of Systems: Negative except as noted in the HPI. Denies N/V/F/Ch.  Past Medical History:  Diagnosis Date   Anxiety    Asthma    B12 deficiency    Cancer ([place])    SKIN CANCER   CHF (congestive heart failure) ([facility])    Depression    Hypertension    Thyroid disease     Current Outpatient Medications:    doxycycline (VIBRA-TABS) 100 MG tablet, Take 1 tablet (100 mg total) by mouth 2 (two) times daily for 14 days., Disp: 28 tablet, Rfl: 0   albuterol (VENTOLIN HFA) 108 (90 Base) MCG/ACT inhaler, Inhale into the lungs., Disp: , Rfl:    azelastine (ASTELIN) 0.1 % nasal spray, Place into the nose., Disp: , Rfl:    calcitRIOL (ROCALTROL) 0.25 MCG capsule, TAKE ONE CAPSULE BY MOUTH DAILY, Disp: 90 capsule, Rfl: 1   clonazePAM (KLONOPIN) 0.5 MG tablet, Take 0.5 tablets (0.25 mg total) by mouth 2 (two) times daily as needed for anxiety., Disp: 30 tablet, Rfl: 0   escitalopram (LEXAPRO) 10 MG tablet, Take 1 tablet (10 mg total) by mouth daily. Reduced dose, Disp: 30 tablet, Rfl: 0   esomeprazole (NEXIUM) 40 MG capsule, Take by mouth., Disp: , Rfl:    Eszopiclone 3 MG TABS, TAKE ONE TABLET BY MOUTH IMMEDIATELY BEFORE BEDTIME, Disp: 30 tablet, Rfl: 1   fenofibrate 160 MG tablet, Take by mouth., Disp: , Rfl:    fenofibrate 160 MG  tablet, Take 1 tablet by mouth daily., Disp: , Rfl:    fluticasone (FLONASE) 50 MCG/ACT nasal spray, , Disp: , Rfl:    gabapentin (NEURONTIN) 300 MG capsule, Take 300 mg by mouth. 2 CAP AM, 1 CAP Q noon, 2 CAP pm,, Disp: , Rfl:    HYDROcodone bit-homatropine (HYCODAN) 5-1.5 MG/5ML syrup, , Disp: , Rfl:    lamoTRIgine (LAMICTAL) 150 MG tablet, Take 0.5 tablets (75 mg total) by mouth 2 (two) times daily., Disp: 30 tablet, Rfl: 1   levothyroxine (SYNTHROID) 50 MCG tablet, Take 1 tablet (50 mcg total) by mouth daily., Disp: 90 tablet, Rfl: 3   levothyroxine (SYNTHROID) 75 MCG tablet, TAKE ONE TABLET BY MOUTH EVERY MORNING BEFORE BREAKFAST, Disp: 90 tablet, Rfl: 2   lisinopril (ZESTRIL) 10 MG tablet, Take 10 mg by mouth daily., Disp: , Rfl:    lovastatin (MEVACOR) 20 MG tablet, Take 20 mg by mouth at bedtime., Disp: , Rfl:    MAXITROL 3.5-10000-0.1 ophthalmic suspension, Place 1 drop into the left eye 4 (four) times daily., Disp: , Rfl:    ondansetron (ZOFRAN-ODT) 4 MG disintegrating tablet, , Disp: , Rfl:    pantoprazole (PROTONIX) 40 MG tablet, Take 1 tablet (40 mg total) by mouth daily., Disp: 30 tablet, Rfl: 1  prazosin (MINIPRESS) 2 MG capsule, TAKE ONE CAPSULE BY MOUTH EVERY NIGHT AT BEDTIME, Disp: 30 capsule, Rfl: 1   predniSONE (DELTASONE) 10 MG tablet, , Disp: , Rfl:    propranolol (INNOPRAN XL) 80 MG 24 hr capsule, Take by mouth., Disp: , Rfl:    propranolol ER (INDERAL LA) 80 MG 24 hr capsule, , Disp: , Rfl:    vitamin B-12 (CYANOCOBALAMIN) 1000 MCG tablet, Take by mouth.  (Patient not taking: Reported on 06/06/2021), Disp: , Rfl:   Social History   Tobacco Use  Smoking Status Never  Smokeless Tobacco Never    Allergies  Allergen Reactions   Levofloxacin Other (See Comments) and Nausea Only    Lethargic, Flu-like symptoms, Hot flashes    Atorvastatin    Hydrochlorothiazide Other (See Comments)    Intolerance - dry lips   Parafon Forte Dsc [Chlorzoxazone]    Pollen Extract     Sudafed [Pseudoephedrine Hcl]     Heart racing   Objective:  There were no vitals filed for this visit. There is no height or weight on file to calculate BMI. Constitutional Well developed. Well nourished.  Vascular Dorsalis pedis pulses palpable bilaterally. Posterior tibial pulses palpable bilaterally. Capillary refill normal to all digits.  No cyanosis or clubbing noted. Pedal hair growth normal.  Neurologic Normal speech. Oriented to person, place, and time. Epicritic sensation to light touch grossly present bilaterally.  Dermatologic Nails well groomed and normal in appearance. No open wounds. No skin lesions.  Orthopedic: Paronychia noted of the right fifth digit.  No cellulitis noted.  No purulent drainage noted.  No nail noted.  No ulceration noted.   Radiographs: None Assessment:   1. Paronychia of fifth toe of right foot    Plan:  Patient was evaluated and treated and all questions answered.  Right fifth digit paronychia status post self avulsion of the nail -I explained to the patient the etiology of paronychia and various treatment options were extensively discussed.  Given the amount of redness is present she will benefit from antibiotics.  Doxycycline was sent to the pharmacy for next 14 days.  I encouraged her to start taking.  She states understanding. -Surgical shoe was dispensed -I discussed Betadine wet-to-dry dressing changes to allow the nail to heal.  No follow-ups on file.

## 2021-06-29 ENCOUNTER — Ambulatory Visit: Payer: Medicare PPO | Admitting: Psychiatry

## 2021-07-15 ENCOUNTER — Other Ambulatory Visit: Payer: Self-pay | Admitting: Psychiatry

## 2021-07-15 DIAGNOSIS — F3176 Bipolar disorder, in full remission, most recent episode depressed: Secondary | ICD-10-CM

## 2021-07-19 ENCOUNTER — Other Ambulatory Visit: Payer: Self-pay | Admitting: Psychiatry

## 2021-07-19 ENCOUNTER — Ambulatory Visit: Payer: Medicare PPO | Admitting: Psychiatry

## 2021-07-19 ENCOUNTER — Telehealth: Payer: Self-pay

## 2021-07-19 ENCOUNTER — Ambulatory Visit: Payer: Medicare PPO | Admitting: Podiatry

## 2021-07-19 DIAGNOSIS — F411 Generalized anxiety disorder: Secondary | ICD-10-CM

## 2021-07-19 DIAGNOSIS — G4701 Insomnia due to medical condition: Secondary | ICD-10-CM

## 2021-07-19 DIAGNOSIS — F3132 Bipolar disorder, current episode depressed, moderate: Secondary | ICD-10-CM

## 2021-07-19 MED ORDER — ESCITALOPRAM OXALATE 10 MG PO TABS: 10.0000 mg | ORAL_TABLET | Freq: Every day | ORAL | 0 refills | Status: DC

## 2021-07-19 MED ORDER — LAMOTRIGINE 150 MG PO TABS: 75.0000 mg | ORAL_TABLET | Freq: Two times a day (BID) | ORAL | 0 refills | Status: DC

## 2021-07-19 MED ORDER — ESZOPICLONE 3 MG PO TABS: ORAL_TABLET | ORAL | 0 refills | Status: DC

## 2021-07-19 MED ORDER — CLONAZEPAM 0.5 MG PO TABS: 0.2500 mg | ORAL_TABLET | Freq: Two times a day (BID) | ORAL | 0 refills | Status: DC | PRN

## 2021-07-19 MED ORDER — PRAZOSIN HCL 2 MG PO CAPS: 2.0000 mg | ORAL_CAPSULE | Freq: Every day | ORAL | 0 refills | Status: DC

## 2021-07-19 NOTE — Telephone Encounter (Signed)
I have sent all her medications to the pharmacy.

## 2021-07-19 NOTE — Telephone Encounter (Signed)
pt had to r/s her appt she ask if she can get enough medications to get to her next appt.

## 2021-07-23 ENCOUNTER — Other Ambulatory Visit: Payer: Self-pay | Admitting: Psychiatry

## 2021-07-23 DIAGNOSIS — F411 Generalized anxiety disorder: Secondary | ICD-10-CM

## 2021-08-04 ENCOUNTER — Encounter: Payer: Self-pay | Admitting: Psychiatry

## 2021-08-04 ENCOUNTER — Telehealth (INDEPENDENT_AMBULATORY_CARE_PROVIDER_SITE_OTHER): Payer: Medicare PPO | Admitting: Psychiatry

## 2021-08-04 ENCOUNTER — Other Ambulatory Visit: Payer: Self-pay

## 2021-08-04 DIAGNOSIS — F3132 Bipolar disorder, current episode depressed, moderate: Secondary | ICD-10-CM | POA: Diagnosis not present

## 2021-08-04 DIAGNOSIS — G4701 Insomnia due to medical condition: Secondary | ICD-10-CM | POA: Diagnosis not present

## 2021-08-04 DIAGNOSIS — Z9189 Other specified personal risk factors, not elsewhere classified: Secondary | ICD-10-CM | POA: Diagnosis not present

## 2021-08-04 DIAGNOSIS — F411 Generalized anxiety disorder: Secondary | ICD-10-CM

## 2021-08-04 MED ORDER — PRAZOSIN HCL 2 MG PO CAPS: 2.0000 mg | ORAL_CAPSULE | Freq: Every day | ORAL | 0 refills | Status: DC

## 2021-08-04 MED ORDER — ESCITALOPRAM OXALATE 10 MG PO TABS: 10.0000 mg | ORAL_TABLET | Freq: Every day | ORAL | 0 refills | Status: DC

## 2021-08-04 MED ORDER — LAMOTRIGINE 150 MG PO TABS: 75.0000 mg | ORAL_TABLET | Freq: Two times a day (BID) | ORAL | 0 refills | Status: DC

## 2021-08-04 MED ORDER — ESZOPICLONE 3 MG PO TABS: ORAL_TABLET | ORAL | 0 refills | Status: DC

## 2021-08-04 NOTE — Progress Notes (Signed)
Virtual Visit via Video Note  I connected with Kaylee Gordon on 08/04/21 at  2:00 PM EST by a video enabled telemedicine application and verified that I am speaking with the correct person using two identifiers.  Location Provider Location : [facility] Patient Location : Home  Participants: Patient , Provider    I discussed the limitations of evaluation and management by telemedicine and the availability of in person appointments. The patient expressed understanding and agreed to proceed.   I discussed the assessment and treatment plan with the patient. The patient was provided an opportunity to ask questions and all were answered. The patient agreed with the plan and demonstrated an understanding of the instructions.   The patient was advised to call back or seek an in-person evaluation if the symptoms worsen or if the condition fails to improve as anticipated.  [place] MD OP Progress Note  08/04/2021 2:18 PM Kaylee Gordon  MRN:  [id number]  Chief Complaint:  Chief Complaint   Follow-up; 61 year old Caucasian female with history of bipolar disorder, GAD, fibromyalgia, recent QT prolongation syndrome with recent medication readjustment per cardiology recommendation follows up for further medication changes.  Marland Kitchen  HPI: Kaylee Gordon is a 61 year old Caucasian female who has a history of bipolar disorder, GAD, fibromyalgia, married, lives in [place] was evaluated by telemedicine today.  Patient reports she was unable to keep her appointment as discussed last visit due to getting sick with COVID-19 as well as a stomach bug.  Patient reports she was sick majority of December and continues to have residual symptoms of fatigue and cough.  Her daughter got sick recently and had to go to the emergency department last night.  Patient reports she has been worried about her daughter a lot.  Patient reports anxiety symptoms as worsening since the past few weeks.  She however does have Klonopin  available and agrees to make use of it if she needs it.  She has been limiting use.  She continues to take low dosage of Lexapro, it was reinitiated after clearance from cardiology.  Patient does report feeling depressed due to all the stressors that is going on around her.  She is agreeable to making use of psychotherapy sessions and follows up with Ms. Miguel Dibble.  Patient denies any suicidality, homicidality or perceptual disturbances.  Patient reports she has upcoming appointment with cardiology next week for getting her EKG repeated.  She has been noncompliant with that.  Patient is aware of further medication readjustment needs a repeat EKG due to her history of prolonged QT syndrome.  Patient denies any other concerns today.    Visit Diagnosis:    ICD-10-CM   1. Bipolar 1 disorder, depressed, moderate (HCC)  F31.32 lamoTRIgine (LAMICTAL) 150 MG tablet    2. GAD (generalized anxiety disorder)  F41.1 escitalopram (LEXAPRO) 10 MG tablet    lamoTRIgine (LAMICTAL) 150 MG tablet    3. Insomnia due to medical condition  G47.01 Eszopiclone 3 MG TABS    prazosin (MINIPRESS) 2 MG capsule    4. At risk for prolonged QT interval syndrome  Z91.89     5. Insomnia due to medical condition  G47.01 Eszopiclone 3 MG TABS    prazosin (MINIPRESS) 2 MG capsule   pain,mood, anxiety    6. Insomnia due to medical condition  G47.01 Eszopiclone 3 MG TABS    prazosin (MINIPRESS) 2 MG capsule   pain, anxiety      Past Psychiatric History: Reviewed past psychiatric history from  progress note on 09/11/2018.  Past Medical History:  Past Medical History:  Diagnosis Date   Anxiety    Asthma    B12 deficiency    Cancer ([place])    SKIN CANCER   CHF (congestive heart failure) ([place])    Depression    Hypertension    Thyroid disease     Past Surgical History:  Procedure Laterality Date   ANKLE SURGERY Right 2012   APPENDECTOMY     AUGMENTATION MAMMAPLASTY Bilateral    BREAST ENHANCEMENT SURGERY      COLONOSCOPY WITH PROPOFOL N/A 02/22/2015   Procedure: COLONOSCOPY WITH PROPOFOL;  Surgeon: Josefine Class, MD;  Location: [facility] ENDOSCOPY;  Service: Endoscopy;  Laterality: N/A;   ESOPHAGOGASTRODUODENOSCOPY (EGD) WITH PROPOFOL N/A 02/22/2015   Procedure: ESOPHAGOGASTRODUODENOSCOPY (EGD) WITH PROPOFOL;  Surgeon: Josefine Class, MD;  Location: [facility] ENDOSCOPY;  Service: Endoscopy;  Laterality: N/A;   ESOPHAGOGASTRODUODENOSCOPY (EGD) WITH PROPOFOL N/A 02/11/2016   Procedure: ESOPHAGOGASTRODUODENOSCOPY (EGD) WITH PROPOFOL;  Surgeon: Manya Silvas, MD;  Location: [facility] ENDOSCOPY;  Service: Endoscopy;  Laterality: N/A;   HEMORRHOID SURGERY     NASAL SINUS SURGERY     SAVORY DILATION N/A 02/22/2015   Procedure: SAVORY DILATION;  Surgeon: Josefine Class, MD;  Location: [facility] ENDOSCOPY;  Service: Endoscopy;  Laterality: N/A;   SAVORY DILATION N/A 02/11/2016   Procedure: SAVORY DILATION;  Surgeon: Manya Silvas, MD;  Location[facility] ENDOSCOPY;  Service: Endoscopy;  Laterality: N/A;   SKIN CANCER EXCISION      Family Psychiatric History: Reviewed family psychiatric history from progress note on 09/11/2018.  Family History:  Family History  Problem Relation Age of Onset   Cancer Mother        colon cancer   Depression Mother    Anxiety disorder Mother    Heart disease Father    Cancer Father        ? neck cancer- still living.    Anxiety disorder Father    Depression Father    Hypoparathyroidism Son    Colon cancer Maternal Aunt        died in 23s.     Social History: Reviewed social history from progress note on 09/11/2018. Social History   Socioeconomic History   Marital status: Married    Spouse name: Kaylee Gordon   Number of children: Not on file   Years of education: Not on file   Highest education level: Not on file  Occupational History   Not on file  Tobacco Use   Smoking status: Never   Smokeless tobacco: Never  Vaping Use   Vaping Use: Not on file  Substance and  Sexual Activity   Alcohol use: No    Alcohol/week: 0.0 standard drinks    Comment: MAYBE ONCE OR TWICE A MONTH   Drug use: No   Sexual activity: Not Currently    Partners: Male  Other Topics Concern   Not on file  Social History Narrative   Lives in [place]; taught high school- math; retd; no smoking; social alcohol.    Social Determinants of Health   Financial Resource Strain: Not on file  Food Insecurity: Not on file  Transportation Needs: Not on file  Physical Activity: Not on file  Stress: Not on file  Social Connections: Not on file    Allergies:  Allergies  Allergen Reactions   Levofloxacin Other (See Comments) and Nausea Only    Lethargic, Flu-like symptoms, Hot flashes    Atorvastatin    Hydrochlorothiazide Other (  See Comments)    Intolerance - dry lips   Parafon Forte Dsc [Chlorzoxazone]    Pollen Extract    Sudafed [Pseudoephedrine Hcl]     Heart racing    Metabolic Disorder Labs: Lab Results  Component Value Date   HGBA1C 5.9 (H) 02/16/2021   Lab Results  Component Value Date   PROLACTIN 18.3 07/22/2014   Lab Results  Component Value Date   CHOL 184 02/16/2021   TRIG 179 (H) 02/16/2021   HDL 37 (L) 02/16/2021   CHOLHDL 5.0 (H) 02/16/2021   LDLCALC 115 (H) 02/16/2021   Lab Results  Component Value Date   TSH 2.510 02/16/2021   TSH 1.01 08/05/2020    Therapeutic Level Labs: No results found for: LITHIUM Lab Results  Component Value Date   VALPROATE 39 (L) 02/04/2019   No components found for:  CBMZ  Current Medications: Current Outpatient Medications  Medication Sig Dispense Refill   albuterol (VENTOLIN HFA) 108 (90 Base) MCG/ACT inhaler Inhale into the lungs.     azelastine (ASTELIN) 0.1 % nasal spray Place into the nose.     calcitRIOL (ROCALTROL) 0.25 MCG capsule TAKE ONE CAPSULE BY MOUTH DAILY 90 capsule 1   clonazePAM (KLONOPIN) 0.5 MG tablet Take 0.5 tablets (0.25 mg total) by mouth 2 (two) times daily as needed for anxiety.  30 tablet 0   escitalopram (LEXAPRO) 10 MG tablet Take 1 tablet (10 mg total) by mouth daily. Reduced dose 30 tablet 0   esomeprazole (NEXIUM) 40 MG capsule Take by mouth.     Eszopiclone 3 MG TABS TAKE ONE TABLET BY MOUTH IMMEDIATELY BEFORE BEDTIME 30 tablet 0   fenofibrate 160 MG tablet Take by mouth.     fenofibrate 160 MG tablet Take 1 tablet by mouth daily.     fluticasone (FLONASE) 50 MCG/ACT nasal spray      gabapentin (NEURONTIN) 300 MG capsule Take 300 mg by mouth. 2 CAP AM, 1 CAP Q noon, 2 CAP pm,     HYDROcodone bit-homatropine (HYCODAN) 5-1.5 MG/5ML syrup  (Patient not taking: Reported on 06/06/2021)     lamoTRIgine (LAMICTAL) 150 MG tablet Take 0.5 tablets (75 mg total) by mouth 2 (two) times daily. 30 tablet 0   levothyroxine (SYNTHROID) 50 MCG tablet Take 1 tablet (50 mcg total) by mouth daily. 90 tablet 3   levothyroxine (SYNTHROID) 75 MCG tablet TAKE ONE TABLET BY MOUTH EVERY MORNING BEFORE BREAKFAST 90 tablet 2   lisinopril (ZESTRIL) 10 MG tablet Take 10 mg by mouth daily.     lovastatin (MEVACOR) 20 MG tablet Take 20 mg by mouth at bedtime.     MAXITROL 3.5-10000-0.1 ophthalmic suspension Place 1 drop into the left eye 4 (four) times daily.     ondansetron (ZOFRAN-ODT) 4 MG disintegrating tablet      pantoprazole (PROTONIX) 40 MG tablet Take 1 tablet (40 mg total) by mouth daily. 30 tablet 1   prazosin (MINIPRESS) 2 MG capsule Take 1 capsule (2 mg total) by mouth at bedtime. 30 capsule 0   predniSONE (DELTASONE) 10 MG tablet  (Patient not taking: Reported on 06/06/2021)     propranolol (INNOPRAN XL) 80 MG 24 hr capsule Take by mouth.     propranolol ER (INDERAL LA) 80 MG 24 hr capsule      vitamin B-12 (CYANOCOBALAMIN) 1000 MCG tablet Take by mouth.  (Patient not taking: Reported on 06/06/2021)     No current facility-administered medications for this visit.     Musculoskeletal: Strength &  Muscle Tone:  UTA Gait & Station: normal Patient leans: N/A  Psychiatric  Specialty Exam: Review of Systems  Constitutional:  Positive for fatigue.  Respiratory:  Positive for cough.   Psychiatric/Behavioral:  Positive for dysphoric mood and sleep disturbance. The patient is nervous/anxious.   All other systems reviewed and are negative.  There were no vitals taken for this visit.There is no height or weight on file to calculate BMI.  General Appearance: Casual  Eye Contact:  Fair  Speech:  Clear and Coherent  Volume:  Normal  Mood:  Anxious and Dysphoric  Affect:  Congruent  Thought Process:  Goal Directed and Descriptions of Associations: Intact  Orientation:  Full (Time, Place, and Person)  Thought Content: Logical   Suicidal Thoughts:  No  Homicidal Thoughts:  No  Memory:  Immediate;   Fair Recent;   Fair Remote;   Fair  Judgement:  Fair  Insight:  Fair  Psychomotor Activity:  Normal  Concentration:  Concentration: Fair and Attention Span: Fair  Recall:  [employer] of Knowledge: Fair  Language: Fair  Akathisia:  No  Handed:  Right  AIMS (if indicated): not done  Assets:  Communication Skills Desire for Improvement Housing Social Support  ADL's:  Intact  Cognition: WNL  Sleep:   Restless   Screenings: PHQ2-9    [place] Office Visit from 06/06/2021 in [place] Video Visit from 02/10/2021 in [place] Video Visit from 01/25/2021 in [place] Video Visit from 12/15/2020 in [place] Video Visit from 10/13/2020 in [place]  PHQ-2 Total Score 2 2 3 4 5   PHQ-9 Total Score 7 11 11 15 20       [place] Office Visit from 06/06/2021 in [place] Video Visit from 09/20/2020 in [place] No Risk No Risk        Assessment and Plan: HARBOUR NORDMEYER is a 61 year old Caucasian female, married, lives in  [place], has a history of bipolar disorder, GAD, fibromyalgia, IBS was evaluated by telemedicine today.  Patient with history of prolonged QT syndrome, recent COVID-19 infection, continues to struggle with multiple psychosocial stressors, anxiety and depression symptoms.  She will benefit from cardiology follow-up and repeat EKG prior to making more medication changes.  Patient advised to continue medications as prescribed and follow up with psychotherapist for CBT.  Plan as noted below.  Plan Bipolar disorder-unstable Continue Lexapro 10 mg p.o. daily-reduced dosage Lamotrigine 150 mg p.o. daily in divided dosage Lunesta 3 mg p.o. nightly Continue CBT with Ms. Miguel Dibble.  I have coordinated care.  GAD-unstable Lexapro 10 mg p.o. daily.  We will consider increasing the dosage after consultation with cardiology.  Patient has been noncompliant with the EKG however agrees to get it done next week.  She had COVID-19 infection and was unable to get her EKG completed. For now she will continue Klonopin 0.5 mg as needed, advised to use it for severe anxiety symptoms. Continue CBT  Insomnia-restless Sleep problems due to her daughter being sick the past couple of nights.  Otherwise she was sleeping okay on the current combination of medications. Prazosin 2 mg p.o. nightly Lunesta 3 mg p.o. nightly Continued sleep hygiene techniques  Prolonged QT syndrome-unstable Patient was advised to get repeat EKG-noncompliant.  Encouraged compliance. Patient reports she has upcoming appointment with cardiology next week.  Once I review her EKG we will be able  to recommend further medication changes as needed.  Patient advised to follow-up in clinic after  her cardiology appointment.  Follow-up in clinic in 2 to 3 weeks or sooner if needed.  This note was generated in part or whole with voice recognition software. Voice recognition is usually quite accurate but there are transcription errors that can and  very often do occur. I apologize for any typographical errors that were not detected and corrected.         Ursula Alert, MD 08/05/2021, 1:43 PM

## 2021-08-08 ENCOUNTER — Ambulatory Visit: Payer: Medicare PPO | Admitting: Endocrinology

## 2021-08-23 ENCOUNTER — Ambulatory Visit: Payer: Medicare PPO | Admitting: Psychiatry

## 2021-09-11 ENCOUNTER — Other Ambulatory Visit: Payer: Self-pay | Admitting: Psychiatry

## 2021-09-11 DIAGNOSIS — G4701 Insomnia due to medical condition: Secondary | ICD-10-CM

## 2021-09-28 ENCOUNTER — Telehealth: Payer: Medicare PPO | Admitting: Psychiatry

## 2021-10-03 ENCOUNTER — Telehealth (INDEPENDENT_AMBULATORY_CARE_PROVIDER_SITE_OTHER): Payer: Medicare PPO | Admitting: Psychiatry

## 2021-10-03 ENCOUNTER — Other Ambulatory Visit: Payer: Self-pay

## 2021-10-03 ENCOUNTER — Encounter: Payer: Self-pay | Admitting: Psychiatry

## 2021-10-03 DIAGNOSIS — Z9189 Other specified personal risk factors, not elsewhere classified: Secondary | ICD-10-CM

## 2021-10-03 DIAGNOSIS — G4701 Insomnia due to medical condition: Secondary | ICD-10-CM

## 2021-10-03 DIAGNOSIS — Z634 Disappearance and death of family member: Secondary | ICD-10-CM | POA: Insufficient documentation

## 2021-10-03 DIAGNOSIS — F411 Generalized anxiety disorder: Secondary | ICD-10-CM | POA: Diagnosis not present

## 2021-10-03 DIAGNOSIS — F3132 Bipolar disorder, current episode depressed, moderate: Secondary | ICD-10-CM | POA: Diagnosis not present

## 2021-10-03 MED ORDER — BELSOMRA 10 MG PO TABS: 10.0000 mg | ORAL_TABLET | Freq: Every day | ORAL | 0 refills | Status: DC

## 2021-10-03 NOTE — Progress Notes (Signed)
Virtual Visit via Video Note ? ?I connected with Kaylee Gordon on 10/03/21 at 10:30 AM EDT by a video enabled telemedicine application and verified that I am speaking with the correct person using two identifiers. ? ?Location ?Provider Location : [facility] ?Patient Location : Home ? ?Participants: Patient , Provider ? ?  ?I discussed the limitations of evaluation and management by telemedicine and the availability of in person appointments. The patient expressed understanding and agreed to proceed. ? ?  ?I discussed the assessment and treatment plan with the patient. The patient was provided an opportunity to ask questions and all were answered. The patient agreed with the plan and demonstrated an understanding of the instructions. ?  ?The patient was advised to call back or seek an in-person evaluation if the symptoms worsen or if the condition fails to improve as anticipated. ? ? ?[place] MD OP Progress Note ? ?10/03/2021 11:04 AM ?Kaylee Gordon  ?MRN:  [id number] ? ?Chief Complaint:  ?Chief Complaint  ?Patient presents with  ? Follow-up: 61 year old Caucasian female with history of bipolar disorder, GAD, insomnia, fibromyalgia, recently lost her father, presented for medication management.  ? ?HPI: Kaylee Gordon is a 61 year old Caucasian female who has a history of bipolar disorder, GAD, insomnia, fibromyalgia, married, lives in [place], was evaluated by telemedicine today. ? ?Patient today reports she lost her father in February.  She never had a good relationship with her father and hence it has been extremely hard to cope.  She has been working closely with her therapist with her grief reaction and that has been beneficial. ? ?Patient today reports she struggles with sadness, racing thoughts, intrusive memories, sleep problems.  She reports at night she is unable to shut her mind off.  The Lunesta does not help every night.  There has been some nights in the past few weeks when she could not sleep at all.   Interested in medication changes. ? ?Patient continues to struggle with anhedonia, depressive symptoms, concentration problems on and off.  She also has significant anxiety, worrying about things in general, feeling restless and anxious often.  Patient is currently compliant on her Lamictal and Lexapro.  Denies side effects. ? ?Patient denies any suicidality, homicidality or perceptual disturbances. ? ?Patient with history of prolonged QT syndrome had an appointment with cardiology recently, reports EKG was repeated.  However the notes are not available .  Discussed with patient to sign a release to obtain medical records or call cardiologist to fax it to [place]. ? ?Patient denies any other concerns today. ? ?Visit Diagnosis:  ?  ICD-10-CM   ?1. Bipolar 1 disorder, depressed, moderate ([place])  F31.32   ?  ?2. GAD (generalized anxiety disorder)  F41.1   ?  ?3. Insomnia due to medical condition  G47.01 Suvorexant (BELSOMRA) 10 MG TABS  ? anxiety grief, pain  ?  ?4. Bereavement  Z63.4   ?  ?5. At risk for prolonged QT interval syndrome  Z91.89   ?  ? ? ?Past Psychiatric History: Reviewed past psychiatric history from progress note on 09/11/2018. ? ?Past Medical History:  ?Past Medical History:  ?Diagnosis Date  ? Anxiety   ? Asthma   ? B12 deficiency   ? Cancer [facility])   ? SKIN CANCER  ? CHF (congestive heart failure) ([place])   ? Depression   ? Hypertension   ? Thyroid disease   ?  ?Past Surgical History:  ?Procedure Laterality Date  ? ANKLE SURGERY Right 2012  ?  APPENDECTOMY    ? AUGMENTATION MAMMAPLASTY Bilateral   ? BREAST ENHANCEMENT SURGERY    ? COLONOSCOPY WITH PROPOFOL N/A 02/22/2015  ? Procedure: COLONOSCOPY WITH PROPOFOL;  Surgeon: Josefine Class, MD;  Location: [facility] ENDOSCOPY;  Service: Endoscopy;  Laterality: N/A;  ? ESOPHAGOGASTRODUODENOSCOPY (EGD) WITH PROPOFOL N/A 02/22/2015  ? Procedure: ESOPHAGOGASTRODUODENOSCOPY (EGD) WITH PROPOFOL;  Surgeon: Josefine Class, MD;  Location: [facility] ENDOSCOPY;  Service:  Endoscopy;  Laterality: N/A;  ? ESOPHAGOGASTRODUODENOSCOPY (EGD) WITH PROPOFOL N/A 02/11/2016  ? Procedure: ESOPHAGOGASTRODUODENOSCOPY (EGD) WITH PROPOFOL;  Surgeon: Manya Silvas, MD;  Location: [facility] ENDOSCOPY;  Service: Endoscopy;  Laterality: N/A;  ? HEMORRHOID SURGERY    ? NASAL SINUS SURGERY    ? SAVORY DILATION N/A 02/22/2015  ? Procedure: SAVORY DILATION;  Surgeon: Josefine Class, MD;  Location: [facility] ENDOSCOPY;  Service: Endoscopy;  Laterality: N/A;  ? SAVORY DILATION N/A 02/11/2016  ? Procedure: SAVORY DILATION;  Surgeon: Manya Silvas, MD;  Location: [facility] ENDOSCOPY;  Service: Endoscopy;  Laterality: N/A;  ? SKIN CANCER EXCISION    ? ? ?Family Psychiatric History: Reviewed family psychiatric history from progress note on 09/11/2018. ? ?Family History:  ?Family History  ?Problem Relation Age of Onset  ? Cancer Mother   ?     colon cancer  ? Depression Mother   ? Anxiety disorder Mother   ? Heart disease Father   ? Cancer Father   ?     ? neck cancer- still living.   ? Anxiety disorder Father   ? Depression Father   ? Hypoparathyroidism Son   ? Colon cancer Maternal Aunt   ?     died in 53s.   ? ? ?Social History: Reviewed social history from progress note on 09/11/2018. ?Social History  ? ?Socioeconomic History  ? Marital status: Married  ?  Spouse name: Legrand Como  ? Number of children: Not on file  ? Years of education: Not on file  ? Highest education level: Not on file  ?Occupational History  ? Not on file  ?Tobacco Use  ? Smoking status: Never  ? Smokeless tobacco: Never  ?Vaping Use  ? Vaping Use: Not on file  ?Substance and Sexual Activity  ? Alcohol use: No  ?  Alcohol/week: 0.0 standard drinks  ?  Comment: MAYBE ONCE OR TWICE A MONTH  ? Drug use: No  ? Sexual activity: Not Currently  ?  Partners: Male  ?Other Topics Concern  ? Not on file  ?Social History Narrative  ? Lives in [place]; taught high school- math; retd; no smoking; social alcohol.   ? ?Social Determinants of Health  ? ?Financial  Resource Strain: Not on file  ?Food Insecurity: Not on file  ?Transportation Needs: Not on file  ?Physical Activity: Not on file  ?Stress: Not on file  ?Social Connections: Not on file  ? ? ?Allergies:  ?Allergies  ?Allergen Reactions  ? Levofloxacin Other (See Comments) and Nausea Only  ?  Lethargic, Flu-like symptoms, Hot flashes   ? Atorvastatin   ? Hydrochlorothiazide Other (See Comments)  ?  Intolerance - dry lips  ? Parafon Forte Dsc [Chlorzoxazone]   ? Pollen Extract   ? Sudafed [Pseudoephedrine Hcl]   ?  Heart racing  ? ? ?Metabolic Disorder Labs: ?Lab Results  ?Component Value Date  ? HGBA1C 5.9 (H) 02/16/2021  ? ?Lab Results  ?Component Value Date  ? PROLACTIN 18.3 07/22/2014  ? ?Lab Results  ?Component Value Date  ? CHOL 184 02/16/2021  ?  TRIG 179 (H) 02/16/2021  ? HDL 37 (L) 02/16/2021  ? CHOLHDL 5.0 (H) 02/16/2021  ? LDLCALC 115 (H) 02/16/2021  ? ?Lab Results  ?Component Value Date  ? TSH 2.510 02/16/2021  ? TSH 1.01 08/05/2020  ? ? ?Therapeutic Level Labs: ?No results found for: LITHIUM ?Lab Results  ?Component Value Date  ? VALPROATE 39 (L) 02/04/2019  ? ?No components found for:  CBMZ ? ?Current Medications: ?Current Outpatient Medications  ?Medication Sig Dispense Refill  ? calcitRIOL (ROCALTROL) 0.25 MCG capsule TAKE ONE CAPSULE BY MOUTH DAILY 90 capsule 1  ? clonazePAM (KLONOPIN) 0.5 MG tablet Take 0.5 tablets (0.25 mg total) by mouth 2 (two) times daily as needed for anxiety. 30 tablet 0  ? escitalopram (LEXAPRO) 10 MG tablet Take 1 tablet (10 mg total) by mouth daily. Reduced dose 30 tablet 0  ? esomeprazole (NEXIUM) 40 MG capsule Take by mouth.    ? fenofibrate 160 MG tablet Take 1 tablet by mouth daily.    ? fluticasone (FLONASE) 50 MCG/ACT nasal spray     ? gabapentin (NEURONTIN) 300 MG capsule Take 300 mg by mouth. 2 CAP AM, 1 CAP Q noon, 2 CAP pm,    ? lamoTRIgine (LAMICTAL) 150 MG tablet Take 0.5 tablets (75 mg total) by mouth 2 (two) times daily. 30 tablet 0  ? levothyroxine (SYNTHROID)  50 MCG tablet Take 1 tablet (50 mcg total) by mouth daily. 90 tablet 3  ? lisinopril (ZESTRIL) 10 MG tablet Take 10 mg by mouth daily.    ? lovastatin (MEVACOR) 20 MG tablet Take 20 mg by mouth at bedtime.    ? M

## 2021-10-05 ENCOUNTER — Telehealth: Payer: Self-pay | Admitting: Psychiatry

## 2021-10-05 DIAGNOSIS — F411 Generalized anxiety disorder: Secondary | ICD-10-CM

## 2021-10-05 MED ORDER — ESCITALOPRAM OXALATE 10 MG PO TABS: 15.0000 mg | ORAL_TABLET | Freq: Every day | ORAL | 0 refills | Status: DC

## 2021-10-05 NOTE — Telephone Encounter (Signed)
Patient called wanting to speak with you about med change. Please call her ?

## 2021-10-05 NOTE — Telephone Encounter (Signed)
Increase Lexapro to 15 mg.  Patient advised to call back cardiology for repeat EKG 4 weeks from now. ?

## 2021-10-05 NOTE — Telephone Encounter (Signed)
Spoke to Cardiology - [name] PA - Could increase Lexapro , recommend to repat EKG in 4 weeks after starting the dose . Recommend avoiding any other QT prolonging agents at this time. ? ? ?Attempted to contact patient to discuss this had to leave a voicemail.  We will consider increasing her Lexapro. ?

## 2021-10-09 ENCOUNTER — Other Ambulatory Visit: Payer: Self-pay | Admitting: Psychiatry

## 2021-10-09 DIAGNOSIS — F3132 Bipolar disorder, current episode depressed, moderate: Secondary | ICD-10-CM

## 2021-10-09 DIAGNOSIS — F411 Generalized anxiety disorder: Secondary | ICD-10-CM

## 2021-10-09 DIAGNOSIS — G4701 Insomnia due to medical condition: Secondary | ICD-10-CM

## 2021-10-13 ENCOUNTER — Telehealth: Payer: Self-pay

## 2021-10-13 NOTE — Telephone Encounter (Signed)
Left voice message to contact us back. Please ask if she is interested in trying Sonata instead of Belsomra. Side effect including drowsiness. Thanks.

## 2021-10-13 NOTE — Telephone Encounter (Signed)
pt called states that she could not sleep on the belsomra.  please call her with other medication she can try.  ?

## 2021-10-18 ENCOUNTER — Other Ambulatory Visit: Payer: Self-pay | Admitting: Psychiatry

## 2021-10-18 MED ORDER — ZALEPLON 5 MG PO CAPS: 5.0000 mg | ORAL_CAPSULE | Freq: Every evening | ORAL | 0 refills | Status: DC | PRN

## 2021-10-18 NOTE — Telephone Encounter (Signed)
pt called back and she stated that she would try the sonata . that she is not sleeping at all please send to [place].  ?

## 2021-10-18 NOTE — Telephone Encounter (Signed)
Ordered

## 2021-10-31 ENCOUNTER — Other Ambulatory Visit: Payer: Self-pay | Admitting: Psychiatry

## 2021-10-31 DIAGNOSIS — F411 Generalized anxiety disorder: Secondary | ICD-10-CM

## 2021-11-01 ENCOUNTER — Telehealth (INDEPENDENT_AMBULATORY_CARE_PROVIDER_SITE_OTHER): Payer: Medicare PPO | Admitting: Psychiatry

## 2021-11-01 ENCOUNTER — Encounter: Payer: Self-pay | Admitting: Psychiatry

## 2021-11-01 DIAGNOSIS — F411 Generalized anxiety disorder: Secondary | ICD-10-CM | POA: Diagnosis not present

## 2021-11-01 DIAGNOSIS — F3176 Bipolar disorder, in full remission, most recent episode depressed: Secondary | ICD-10-CM | POA: Diagnosis not present

## 2021-11-01 DIAGNOSIS — Z634 Disappearance and death of family member: Secondary | ICD-10-CM | POA: Diagnosis not present

## 2021-11-01 DIAGNOSIS — G4701 Insomnia due to medical condition: Secondary | ICD-10-CM

## 2021-11-01 DIAGNOSIS — Z9189 Other specified personal risk factors, not elsewhere classified: Secondary | ICD-10-CM

## 2021-11-01 MED ORDER — LAMOTRIGINE 150 MG PO TABS: 75.0000 mg | ORAL_TABLET | Freq: Two times a day (BID) | ORAL | 0 refills | Status: DC

## 2021-11-01 MED ORDER — ZALEPLON 5 MG PO CAPS: 5.0000 mg | ORAL_CAPSULE | Freq: Every evening | ORAL | 0 refills | Status: DC | PRN

## 2021-11-01 MED ORDER — CLONAZEPAM 0.5 MG PO TABS: 0.2500 mg | ORAL_TABLET | Freq: Two times a day (BID) | ORAL | 1 refills | Status: DC | PRN

## 2021-11-01 MED ORDER — ESCITALOPRAM OXALATE 10 MG PO TABS: 15.0000 mg | ORAL_TABLET | Freq: Every day | ORAL | 1 refills | Status: DC

## 2021-11-01 MED ORDER — PRAZOSIN HCL 2 MG PO CAPS: 2.0000 mg | ORAL_CAPSULE | Freq: Every day | ORAL | 0 refills | Status: DC

## 2021-11-01 NOTE — Progress Notes (Signed)
Virtual Visit via Video Note ? ?I connected with Kaylee Gordon on 11/01/21 at  2:40 PM EDT by a video enabled telemedicine application and verified that I am speaking with the correct person using two identifiers. ? ?Location ?Provider Location : [facility] ?Patient Location : Home ? ?Participants: Patient , Provider ? ?  ?I discussed the limitations of evaluation and management by telemedicine and the availability of in person appointments. The patient expressed understanding and agreed to proceed. ?  ?I discussed the assessment and treatment plan with the patient. The patient was provided an opportunity to ask questions and all were answered. The patient agreed with the plan and demonstrated an understanding of the instructions. ?  ?The patient was advised to call back or seek an in-person evaluation if the symptoms worsen or if the condition fails to improve as anticipated. ? ? ?[facility] MD OP Progress Note ? ?11/01/2021 3:28 PM ?Kaylee Gordon  ?MRN:  [id number] ? ?Chief Complaint:  ?Chief Complaint  ?Patient presents with  ? Follow-up: 61 year old Caucasian female with history of bipolar disorder, GAD, insomnia, fibromyalgia, history of QT prolongation, recently lost her father, presented for medication management.  ? ?HPI: Kaylee Gordon is a 61 year old Caucasian female who has a history of bipolar disorder, GAD, insomnia, fibromyalgia, married, lives in [place], was evaluated by telemedicine today. ? ?Patient today reports she continues to feel frustrated about the fact that she has to deal with her father's estate.  She however reports she continues to be in therapy and that has been beneficial. ? ?Patient reports mood symptoms as improving on the Lexapro higher dosage.  She takes the 15 mg right now.  She has not been able to repeat the EKG yet however agrees to call her cardiologist for repeat EKG. ? ?Patient reports sleep is good at this time. ? ?She does feel tired during the day likely due to her  fibromyalgia. ? ?Patient denies any suicidality, homicidality or perceptual disturbances. ? ?Reports she has joined [employer] program and is trying to eat healthy. ? ?Reports good support system from her daughter. ? ?Denies any other concerns today. ? ?Visit Diagnosis:  ?  ICD-10-CM   ?1. Bipolar disorder, in full remission, most recent episode depressed (HCC)  F31.76 lamoTRIgine (LAMICTAL) 150 MG tablet  ?  ?2. GAD (generalized anxiety disorder)  F41.1 lamoTRIgine (LAMICTAL) 150 MG tablet  ?  escitalopram (LEXAPRO) 10 MG tablet  ?  clonazePAM (KLONOPIN) 0.5 MG tablet  ?  ?3. Insomnia due to medical condition  G47.01 prazosin (MINIPRESS) 2 MG capsule  ?  zaleplon (SONATA) 5 MG capsule  ? anxiety, grief  ?  ?4. Bereavement  Z63.4   ?  ?5. At risk for prolonged QT interval syndrome  Z91.89   ?  ? ? ?Past Psychiatric History: Reviewed past psychiatric history from progress note on 09/11/2018. ? ?Past Medical History:  ?Past Medical History:  ?Diagnosis Date  ? Anxiety   ? Asthma   ? B12 deficiency   ? Cancer [facility])   ? SKIN CANCER  ? CHF (congestive heart failure) ([place])   ? Depression   ? Hypertension   ? Thyroid disease   ?  ?Past Surgical History:  ?Procedure Laterality Date  ? ANKLE SURGERY Right 2012  ? APPENDECTOMY    ? AUGMENTATION MAMMAPLASTY Bilateral   ? BREAST ENHANCEMENT SURGERY    ? COLONOSCOPY WITH PROPOFOL N/A 02/22/2015  ? Procedure: COLONOSCOPY WITH PROPOFOL;  Surgeon: Josefine Class, MD;  Location:  [place] ENDOSCOPY;  Service: Endoscopy;  Laterality: N/A;  ? ESOPHAGOGASTRODUODENOSCOPY (EGD) WITH PROPOFOL N/A 02/22/2015  ? Procedure: ESOPHAGOGASTRODUODENOSCOPY (EGD) WITH PROPOFOL;  Surgeon: Josefine Class, MD;  Location[facility] ENDOSCOPY;  Service: Endoscopy;  Laterality: N/A;  ? ESOPHAGOGASTRODUODENOSCOPY (EGD) WITH PROPOFOL N/A 02/11/2016  ? Procedure: ESOPHAGOGASTRODUODENOSCOPY (EGD) WITH PROPOFOL;  Surgeon: Manya Silvas, MD;  Location: [facility] ENDOSCOPY;  Service: Endoscopy;  Laterality: N/A;   ? HEMORRHOID SURGERY    ? NASAL SINUS SURGERY    ? SAVORY DILATION N/A 02/22/2015  ? Procedure: SAVORY DILATION;  Surgeon: Josefine Class, MD;  Location: [facility] ENDOSCOPY;  Service: Endoscopy;  Laterality: N/A;  ? SAVORY DILATION N/A 02/11/2016  ? Procedure: SAVORY DILATION;  Surgeon: Manya Silvas, MD;  Location: [facility] ENDOSCOPY;  Service: Endoscopy;  Laterality: N/A;  ? SKIN CANCER EXCISION    ? ? ?Family Psychiatric History: Reviewed family psychiatric history from progress note on 09/11/2018. ? ?Family History:  ?Family History  ?Problem Relation Age of Onset  ? Cancer Mother   ?     colon cancer  ? Depression Mother   ? Anxiety disorder Mother   ? Heart disease Father   ? Cancer Father   ?     ? neck cancer- still living.   ? Anxiety disorder Father   ? Depression Father   ? Hypoparathyroidism Son   ? Colon cancer Maternal Aunt   ?     died in 61s.   ? ? ?Social History: Reviewed social history from progress note on 09/11/2018. ?Social History  ? ?Socioeconomic History  ? Marital status: Married  ?  Spouse name: Legrand Como  ? Number of children: Not on file  ? Years of education: Not on file  ? Highest education level: Not on file  ?Occupational History  ? Not on file  ?Tobacco Use  ? Smoking status: Never  ? Smokeless tobacco: Never  ?Vaping Use  ? Vaping Use: Not on file  ?Substance and Sexual Activity  ? Alcohol use: No  ?  Alcohol/week: 0.0 standard drinks  ?  Comment: MAYBE ONCE OR TWICE A MONTH  ? Drug use: No  ? Sexual activity: Not Currently  ?  Partners: Male  ?Other Topics Concern  ? Not on file  ?Social History Narrative  ? Lives in [place]; taught high school- math; retd; no smoking; social alcohol.   ? ?Social Determinants of Health  ? ?Financial Resource Strain: Not on file  ?Food Insecurity: Not on file  ?Transportation Needs: Not on file  ?Physical Activity: Not on file  ?Stress: Not on file  ?Social Connections: Not on file  ? ? ?Allergies:  ?Allergies  ?Allergen Reactions  ? Levofloxacin  Other (See Comments) and Nausea Only  ?  Lethargic, Flu-like symptoms, Hot flashes   ? Atorvastatin   ? Hydrochlorothiazide Other (See Comments)  ?  Intolerance - dry lips  ? Parafon Forte Dsc [Chlorzoxazone]   ? Pollen Extract   ? Sudafed [Pseudoephedrine Hcl]   ?  Heart racing  ? ? ?Metabolic Disorder Labs: ?Lab Results  ?Component Value Date  ? HGBA1C 5.9 (H) 02/16/2021  ? ?Lab Results  ?Component Value Date  ? PROLACTIN 18.3 07/22/2014  ? ?Lab Results  ?Component Value Date  ? CHOL 184 02/16/2021  ? TRIG 179 (H) 02/16/2021  ? HDL 37 (L) 02/16/2021  ? CHOLHDL 5.0 (H) 02/16/2021  ? LDLCALC 115 (H) 02/16/2021  ? ?Lab Results  ?Component Value Date  ? TSH 2.510 02/16/2021  ?  TSH 1.01 08/05/2020  ? ? ?Therapeutic Level Labs: ?No results found for: LITHIUM ?Lab Results  ?Component Value Date  ? VALPROATE 39 (L) 02/04/2019  ? ?No components found for:  CBMZ ? ?Current Medications: ?Current Outpatient Medications  ?Medication Sig Dispense Refill  ? azelastine (ASTELIN) 0.1 % nasal spray Place into the nose.    ? calcitRIOL (ROCALTROL) 0.25 MCG capsule TAKE ONE CAPSULE BY MOUTH DAILY 90 capsule 1  ? clonazePAM (KLONOPIN) 0.5 MG tablet Take 0.5 tablets (0.25 mg total) by mouth 2 (two) times daily as needed for anxiety. 15 tablet 1  ? escitalopram (LEXAPRO) 10 MG tablet Take 1.5 tablets (15 mg total) by mouth daily. 45 tablet 1  ? esomeprazole (NEXIUM) 40 MG capsule Take by mouth.    ? fenofibrate 160 MG tablet Take by mouth.    ? fenofibrate 160 MG tablet Take 1 tablet by mouth daily.    ? fluticasone (FLONASE) 50 MCG/ACT nasal spray     ? gabapentin (NEURONTIN) 300 MG capsule Take 300 mg by mouth. 2 CAP AM, 1 CAP Q noon, 2 CAP pm,    ? HYDROcodone bit-homatropine (HYCODAN) 5-1.5 MG/5ML syrup  (Patient not taking: Reported on 10/03/2021)    ? LAGEVRIO 200 MG CAPS capsule Take by mouth. (Patient not taking: Reported on 10/03/2021)    ? lamoTRIgine (LAMICTAL) 150 MG tablet Take 0.5 tablets (75 mg total) by mouth 2 (two) times  daily. 90 tablet 0  ? levothyroxine (SYNTHROID) 50 MCG tablet Take 1 tablet (50 mcg total) by mouth daily. 90 tablet 3  ? levothyroxine (SYNTHROID) 75 MCG tablet TAKE ONE TABLET BY MOUTH EVERY MORNING BEFORE BRE

## 2021-11-11 ENCOUNTER — Other Ambulatory Visit: Payer: Self-pay | Admitting: Endocrinology

## 2021-12-05 ENCOUNTER — Ambulatory Visit: Payer: Medicare PPO | Admitting: Endocrinology

## 2021-12-05 ENCOUNTER — Encounter: Payer: Self-pay | Admitting: Endocrinology

## 2021-12-05 VITALS — BP 118/82 | HR 77 | Ht 64.5 in | Wt 162.6 lb

## 2021-12-05 DIAGNOSIS — E038 Other specified hypothyroidism: Secondary | ICD-10-CM | POA: Diagnosis not present

## 2021-12-05 DIAGNOSIS — E200 Idiopathic hypoparathyroidism: Secondary | ICD-10-CM

## 2021-12-05 DIAGNOSIS — R5383 Other fatigue: Secondary | ICD-10-CM | POA: Diagnosis not present

## 2021-12-05 LAB — BASIC METABOLIC PANEL
BUN: 19 mg/dL (ref 6–23)
CO2: 29 mEq/L (ref 19–32)
Calcium: 9.1 mg/dL (ref 8.4–10.5)
Chloride: 104 mEq/L (ref 96–112)
Creatinine, Ser: 1.21 mg/dL — ABNORMAL HIGH (ref 0.40–1.20)
GFR: 48.12 mL/min — ABNORMAL LOW (ref >60.00)
Glucose, Bld: 83 mg/dL (ref 70–99)
Potassium: 3.8 mEq/L (ref 3.5–5.1)
Sodium: 140 mEq/L (ref 135–145)

## 2021-12-05 LAB — TSH: TSH: 3.3 u[IU]/mL (ref 0.35–5.50)

## 2021-12-05 LAB — T4, FREE: Free T4: 0.68 ng/dL (ref 0.60–1.60)

## 2021-12-05 NOTE — Progress Notes (Signed)
Patient ID: Kaylee Gordon, female   DOB: 03-26-1961, 61 y.o.   MRN: [id number]    History of Present Illness:  Chief complaint: Endocrinology follow-up   Secondary hypothyroidism:  She was initially given thyroxine supplement with a borderline TSH levels in 2011 However because of complaints of  fatigue on her visit in 10/15 she had thyroid levels done and free T4 was significantly low She was empirically started on 25 g  of levothyroxine Initially she felt less tired with this; however her free T4 continue to be low and her dose had been increased progressively  She is taking 75 mcg levothyroxine when her free T4 was relatively low in 6/21 However because of high normal free T4 in 8/22 she is now taking 50 mcg TSH usually is normal  She has longstanding fatigue which is somewhat better but may be worse when she has more fibromyalgia No hair loss or cold intolerance She has lost weight from various other issues and Ozempic  She is taking her levothyroxine daily in the morning before breakfast  She takes her calcium in the evening after dinner TSH and T4 pending  Wt Readings from Last 3 Encounters:  12/05/21 162 lb 9.6 oz (73.8 kg)  06/06/21 182 lb 6.4 oz (82.7 kg)  02/03/21 176 lb 9.6 oz (80.1 kg)    Lab Results  Component Value Date   FREET4 0.93 06/06/2021   FREET4 1.53 02/16/2021   FREET4 0.86 08/05/2020   TSH 2.510 02/16/2021   TSH 1.01 08/05/2020   TSH 1.14 04/05/2020    Hypoparathyroidism    Has had hypocalcemia since she was 61 years old and presented with symptoms of cramping in her hands along with twitching of her face and eyes. She was [facility] at [facility] as having primary idiopathic hypoparathyroidism.   No symptoms of twitching/muscle cramping in her hands or feet, has no tingling or numbness in her face  Occasionally has numbness in her hands  She is taking calcitriol 0.25 mcg daily in the morning and her calcium once a day in  the evenings She takes her supplements very regularly  Last calcium normal as below  Lab Results  Component Value Date   CALCIUM 9.1 06/06/2021   CALCIUM 9.6 02/16/2021   CALCIUM 9.1 04/05/2020   CALCIUM 8.7 (L) 03/12/2020   CALCIUM 9.0 02/24/2020   Lab Results  Component Value Date   K 4.1 06/06/2021      Allergies as of 12/05/2021       Reactions   Levofloxacin Other (See Comments), Nausea Only   Lethargic, Flu-like symptoms, Hot flashes    Atorvastatin    Hydrochlorothiazide Other (See Comments)   Intolerance - dry lips   Parafon Forte Dsc [chlorzoxazone]    Pollen Extract    Sudafed [pseudoephedrine Hcl]    Heart racing        Medication List        Accurate as of Dec 05, 2021  1:15 PM. If you have any questions, ask your nurse or doctor.          azelastine 0.1 % nasal spray Commonly known as: ASTELIN Place into the nose.   calcitRIOL 0.25 MCG capsule Commonly known as: ROCALTROL TAKE ONE CAPSULE BY MOUTH DAILY   clonazePAM 0.5 MG tablet Commonly known as: KlonoPIN Take 0.5 tablets (0.25 mg total) by mouth 2 (two) times daily as needed for anxiety.   escitalopram 10 MG tablet  Commonly known as: Lexapro Take 1.5 tablets (15 mg total) by mouth daily.   esomeprazole 40 MG capsule Commonly known as: NEXIUM Take by mouth.   fenofibrate 160 MG tablet Take by mouth.   fenofibrate 160 MG tablet Take 1 tablet by mouth daily.   fluticasone 50 MCG/ACT nasal spray Commonly known as: FLONASE   gabapentin 300 MG capsule Commonly known as: NEURONTIN Take 300 mg by mouth. 2 CAP AM, 1 CAP Q noon, 2 CAP pm,   HYDROcodone bit-homatropine 5-1.5 MG/5ML syrup Commonly known as: HYCODAN   Lagevrio 200 MG Caps capsule Generic drug: molnupiravir EUA Take by mouth.   lamoTRIgine 150 MG tablet Commonly known as: LAMICTAL Take 0.5 tablets (75 mg total) by mouth 2 (two) times daily.   levothyroxine 50 MCG tablet Commonly known as: SYNTHROID Take 1  tablet (50 mcg total) by mouth daily.   levothyroxine 75 MCG tablet Commonly known as: SYNTHROID TAKE ONE TABLET BY MOUTH EVERY MORNING BEFORE BREAKFAST   lisinopril 10 MG tablet Commonly known as: ZESTRIL Take 10 mg by mouth daily.   lovastatin 20 MG tablet Commonly known as: MEVACOR Take 20 mg by mouth at bedtime.   Maxitrol 0.1 % ophthalmic suspension Generic drug: neomycin-polymyxin-dexamethasone Place 1 drop into the left eye 4 (four) times daily.   ondansetron 4 MG disintegrating tablet Commonly known as: ZOFRAN-ODT   pantoprazole 40 MG tablet Commonly known as: PROTONIX Take 1 tablet (40 mg total) by mouth daily.   prazosin 2 MG capsule Commonly known as: MINIPRESS Take 1 capsule (2 mg total) by mouth at bedtime.   predniSONE 10 MG tablet Commonly known as: DELTASONE   propranolol 80 MG 24 hr capsule Commonly known as: INNOPRAN XL Take by mouth.   Semaglutide(0.25 or 0.'5MG'$ /DOS) 2 MG/1.5ML Sopn Inject into the skin.   vitamin B-12 1000 MCG tablet Commonly known as: CYANOCOBALAMIN Take by mouth.   zaleplon 5 MG capsule Commonly known as: SONATA Take 1 capsule (5 mg total) by mouth at bedtime as needed for sleep.        Allergies:  Allergies  Allergen Reactions   Levofloxacin Other (See Comments) and Nausea Only    Lethargic, Flu-like symptoms, Hot flashes    Atorvastatin    Hydrochlorothiazide Other (See Comments)    Intolerance - dry lips   Parafon Forte Dsc [Chlorzoxazone]    Pollen Extract    Sudafed [Pseudoephedrine Hcl]     Heart racing    Past Medical History:  Diagnosis Date   Anxiety    Asthma    B12 deficiency    Cancer ([place])    SKIN CANCER   CHF (congestive heart failure) ([place])    Depression    Hypertension    Thyroid disease     Past Surgical History:  Procedure Laterality Date   ANKLE SURGERY Right 2012   APPENDECTOMY     AUGMENTATION MAMMAPLASTY Bilateral    BREAST ENHANCEMENT SURGERY     COLONOSCOPY WITH PROPOFOL  N/A 02/22/2015   Procedure: COLONOSCOPY WITH PROPOFOL;  Surgeon: Josefine Class, MD;  Location: [facility] ENDOSCOPY;  Service: Endoscopy;  Laterality: N/A;   ESOPHAGOGASTRODUODENOSCOPY (EGD) WITH PROPOFOL N/A 02/22/2015   Procedure: ESOPHAGOGASTRODUODENOSCOPY (EGD) WITH PROPOFOL;  Surgeon: Josefine Class, MD;  Location: [facility] ENDOSCOPY;  Service: Endoscopy;  Laterality: N/A;   ESOPHAGOGASTRODUODENOSCOPY (EGD) WITH PROPOFOL N/A 02/11/2016   Procedure: ESOPHAGOGASTRODUODENOSCOPY (EGD) WITH PROPOFOL;  Surgeon: Manya Silvas, MD;  Location: [facility] ENDOSCOPY;  Service: Endoscopy;  Laterality: N/A;  HEMORRHOID SURGERY     NASAL SINUS SURGERY     SAVORY DILATION N/A 02/22/2015   Procedure: SAVORY DILATION;  Surgeon: Josefine Class, MD;  Location: [facility] ENDOSCOPY;  Service: Endoscopy;  Laterality: N/A;   SAVORY DILATION N/A 02/11/2016   Procedure: SAVORY DILATION;  Surgeon: Manya Silvas, MD;  Location: [facility] ENDOSCOPY;  Service: Endoscopy;  Laterality: N/A;   SKIN CANCER EXCISION      Family History  Problem Relation Age of Onset   Cancer Mother        colon cancer   Depression Mother    Anxiety disorder Mother    Heart disease Father    Cancer Father        ? neck cancer- still living.    Anxiety disorder Father    Depression Father    Hypoparathyroidism Son    Colon cancer Maternal Aunt        died in 68s.     Social History:  reports that she has never smoked. She has never used smokeless tobacco. She reports that she does not drink alcohol and does not use drugs.  Review of Systems    History of depression, is managed by psychiatrist  She takes vitamin B12 supplements, last B12 level normal  HYPERLIPIDEMIA: Previously had triglycerides of about 450, repeat levels were better Unclear whether she is taking fenofibrate from her PCP now  Lab Results  Component Value Date   CHOL 184 02/16/2021   Lab Results  Component Value Date   HDL 37 (L) 02/16/2021   Lab Results   Component Value Date   LDLCALC 115 (H) 02/16/2021   Lab Results  Component Value Date   TRIG 179 (H) 02/16/2021   Lab Results  Component Value Date   CHOLHDL 5.0 (H) 02/16/2021   No results found for: LDLDIRECT  PREDIABETES:  A1c has been 5.9, last checked in 3/23  Her PCP is treating her with Ozempic for weight loss  Lab Results  Component Value Date   HGBA1C 5.9 (H) 02/16/2021   Lab Results  Component Value Date   LDLCALC 115 (H) 02/16/2021   CREATININE 1.18 06/06/2021     EXAM:  BP 118/82   Pulse 77   Ht 5' 4.5" (1.638 m)   Wt 162 lb 9.6 oz (73.8 kg)   SpO2 97%   BMI 27.48 kg/m   Thyroid not palpable Biceps reflexes show normal relaxation  Assessment/Plan:    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

## 2021-12-06 NOTE — Progress Notes (Signed)
Please call to let patient know that the thyroid level is getting lower and would recommend having her take 1 tablet of levothyroxine 50 mcg alternating with 1-1/2 tablets.  New prescription to be sent.  Calcium okay

## 2021-12-07 ENCOUNTER — Telehealth (INDEPENDENT_AMBULATORY_CARE_PROVIDER_SITE_OTHER): Payer: Self-pay | Admitting: Psychiatry

## 2021-12-07 DIAGNOSIS — Z91199 Patient's noncompliance with other medical treatment and regimen due to unspecified reason: Secondary | ICD-10-CM

## 2021-12-07 MED ORDER — LEVOTHYROXINE SODIUM 50 MCG PO TABS: ORAL_TABLET | ORAL | 3 refills | Status: DC

## 2021-12-07 NOTE — Addendum Note (Signed)
Addended by: Cinda Quest on: 12/07/2021 03:40 PM   Modules accepted: Orders

## 2021-12-07 NOTE — Progress Notes (Signed)
No response to call or text or video invite  

## 2021-12-08 ENCOUNTER — Encounter: Payer: Self-pay | Admitting: Psychiatry

## 2021-12-08 ENCOUNTER — Telehealth (INDEPENDENT_AMBULATORY_CARE_PROVIDER_SITE_OTHER): Payer: Medicare PPO | Admitting: Psychiatry

## 2021-12-08 DIAGNOSIS — F3176 Bipolar disorder, in full remission, most recent episode depressed: Secondary | ICD-10-CM | POA: Diagnosis not present

## 2021-12-08 DIAGNOSIS — Z634 Disappearance and death of family member: Secondary | ICD-10-CM

## 2021-12-08 DIAGNOSIS — G4701 Insomnia due to medical condition: Secondary | ICD-10-CM

## 2021-12-08 DIAGNOSIS — Z9189 Other specified personal risk factors, not elsewhere classified: Secondary | ICD-10-CM

## 2021-12-08 DIAGNOSIS — F411 Generalized anxiety disorder: Secondary | ICD-10-CM

## 2021-12-08 MED ORDER — ZALEPLON 10 MG PO CAPS: 10.0000 mg | ORAL_CAPSULE | Freq: Every evening | ORAL | 0 refills | Status: DC | PRN

## 2021-12-08 MED ORDER — ESCITALOPRAM OXALATE 10 MG PO TABS: 15.0000 mg | ORAL_TABLET | Freq: Every day | ORAL | 0 refills | Status: DC

## 2021-12-08 NOTE — Progress Notes (Signed)
Virtual Visit via Video Note  I connected with Kaylee Gordon on 12/08/21 at  2:20 PM EDT by a video enabled telemedicine application and verified that I am speaking with the correct person using two identifiers.  Location Provider Location : [facility] Patient Location : Car  Participants: Patient , Provider   I discussed the limitations of evaluation and management by telemedicine and the availability of in person appointments. The patient expressed understanding and agreed to proceed.   I discussed the assessment and treatment plan with the patient. The patient was provided an opportunity to ask questions and all were answered. The patient agreed with the plan and demonstrated an understanding of the instructions.   The patient was advised to call back or seek an in-person evaluation if the symptoms worsen or if the condition fails to improve as anticipated.   [facility] MD OP Progress Note  12/08/2021 9:45 PM Kaylee Gordon  MRN:  [id number]  Chief Complaint:  Chief Complaint  Patient presents with   Follow-up: 61 year old Caucasian female with history of bipolar disorder, GAD, insomnia, fibromyalgia, history of QT prolongation, presented for medication management.   HPI: Kaylee Gordon is a 61 year old Caucasian female who has a history of bipolar disorder, GAD, insomnia, fibromyalgia, married, lives in [place], was evaluated by telemedicine today.    Patient today reports she is currently improving with regards to her mood.  She is coping with her anxiety better than before.  Continues to worry about her children however overall coping okay.  Continues to follow-up with her therapist Ms. Miguel Dibble.  Has been limiting the use of clonazepam.  Currently compliant on the Lexapro as scheduled.  Patient does struggle with sleep.  Reports she has difficulty falling asleep 3-4 times a week.  It takes her around 3 hours to fall asleep most of the time.  The sonata does help her to stay  asleep.  Patient has been trying to work on sleep hygiene techniques.  Patient denies any suicidality, homicidality or perceptual disturbances.  Reports she is currently on Ozempic and on Weight Watchers program and has lost several pounds.  Happy with her progress.  Patient with history of QT prolongation, had repeat EKG completed 11/16/2021.  Patient to continue to follow up with her cardiologist.    Patient denies any other concerns today.    Visit Diagnosis:    ICD-10-CM   1. Bipolar disorder, in full remission, most recent episode depressed ([place])  F31.76     2. GAD (generalized anxiety disorder)  F41.1 escitalopram (LEXAPRO) 10 MG tablet    3. Insomnia due to medical condition  G47.01 zaleplon (SONATA) 10 MG capsule   anxiety    4. Bereavement  Z63.4     5. At risk for prolonged QT interval syndrome  Z91.89       Past Psychiatric History: Reviewed past psychiatric history from progress note on 09/11/2018.  Past Medical History:  Past Medical History:  Diagnosis Date   Anxiety    Asthma    B12 deficiency    Cancer ([place])    SKIN CANCER   CHF (congestive heart failure) ([place])    Depression    Hypertension    Thyroid disease     Past Surgical History:  Procedure Laterality Date   ANKLE SURGERY Right 2012   APPENDECTOMY     AUGMENTATION MAMMAPLASTY Bilateral    BREAST ENHANCEMENT SURGERY     COLONOSCOPY WITH PROPOFOL N/A 02/22/2015   Procedure: COLONOSCOPY WITH PROPOFOL;  Surgeon: Josefine Class, MD;  Location[facility] ENDOSCOPY;  Service: Endoscopy;  Laterality: N/A;   ESOPHAGOGASTRODUODENOSCOPY (EGD) WITH PROPOFOL N/A 02/22/2015   Procedure: ESOPHAGOGASTRODUODENOSCOPY (EGD) WITH PROPOFOL;  Surgeon: Josefine Class, MD;  Location[facility] ENDOSCOPY;  Service: Endoscopy;  Laterality: N/A;   ESOPHAGOGASTRODUODENOSCOPY (EGD) WITH PROPOFOL N/A 02/11/2016   Procedure: ESOPHAGOGASTRODUODENOSCOPY (EGD) WITH PROPOFOL;  Surgeon: Manya Silvas, MD;  Location: [facility] ENDOSCOPY;   Service: Endoscopy;  Laterality: N/A;   HEMORRHOID SURGERY     NASAL SINUS SURGERY     SAVORY DILATION N/A 02/22/2015   Procedure: SAVORY DILATION;  Surgeon: Josefine Class, MD;  Location: [facility] ENDOSCOPY;  Service: Endoscopy;  Laterality: N/A;   SAVORY DILATION N/A 02/11/2016   Procedure: SAVORY DILATION;  Surgeon: Manya Silvas, MD;  Location: [facility] ENDOSCOPY;  Service: Endoscopy;  Laterality: N/A;   SKIN CANCER EXCISION      Family Psychiatric History: Reviewed family psychiatric history from progress note on 09/11/2018.  Family History:  Family History  Problem Relation Age of Onset   Cancer Mother        colon cancer   Depression Mother    Anxiety disorder Mother    Heart disease Father    Cancer Father        ? neck cancer- still living.    Anxiety disorder Father    Depression Father    Hypoparathyroidism Son    Colon cancer Maternal Aunt        died in 40s.     Social History: Reviewed social history from progress note on 09/11/2018. Social History   Socioeconomic History   Marital status: Married    Spouse name: Legrand Como   Number of children: Not on file   Years of education: Not on file   Highest education level: Not on file  Occupational History   Not on file  Tobacco Use   Smoking status: Never   Smokeless tobacco: Never  Vaping Use   Vaping Use: Not on file  Substance and Sexual Activity   Alcohol use: No    Alcohol/week: 0.0 standard drinks    Comment: MAYBE ONCE OR TWICE A MONTH   Drug use: No   Sexual activity: Not Currently    Partners: Male  Other Topics Concern   Not on file  Social History Narrative   Lives in [place]; taught high school- math; retd; no smoking; social alcohol.    Social Determinants of Health   Financial Resource Strain: Not on file  Food Insecurity: Not on file  Transportation Needs: Not on file  Physical Activity: Not on file  Stress: Not on file  Social Connections: Not on file    Allergies:  Allergies   Allergen Reactions   Levofloxacin Other (See Comments) and Nausea Only    Lethargic, Flu-like symptoms, Hot flashes    Atorvastatin    Hydrochlorothiazide Other (See Comments)    Intolerance - dry lips   Parafon Forte Dsc [Chlorzoxazone]    Pollen Extract    Sudafed [Pseudoephedrine Hcl]     Heart racing    Metabolic Disorder Labs: Lab Results  Component Value Date   HGBA1C 5.9 (H) 02/16/2021   Lab Results  Component Value Date   PROLACTIN 18.3 07/22/2014   Lab Results  Component Value Date   CHOL 184 02/16/2021   TRIG 179 (H) 02/16/2021   HDL 37 (L) 02/16/2021   CHOLHDL 5.0 (H) 02/16/2021   LDLCALC 115 (H) 02/16/2021   Lab Results  Component  Value Date   TSH 3.30 12/05/2021   TSH 2.510 02/16/2021    Therapeutic Level Labs: No results found for: LITHIUM Lab Results  Component Value Date   VALPROATE 39 (L) 02/04/2019   No components found for:  CBMZ  Current Medications: Current Outpatient Medications  Medication Sig Dispense Refill   Semaglutide, 1 MG/DOSE, 4 MG/3ML SOPN Inject into the skin.     zaleplon (SONATA) 10 MG capsule Take 1 capsule (10 mg total) by mouth at bedtime as needed for sleep. 30 capsule 0   azelastine (ASTELIN) 0.1 % nasal spray Place into the nose.     calcitRIOL (ROCALTROL) 0.25 MCG capsule TAKE ONE CAPSULE BY MOUTH DAILY 90 capsule 1   clonazePAM (KLONOPIN) 0.5 MG tablet Take 0.5 tablets (0.25 mg total) by mouth 2 (two) times daily as needed for anxiety. 15 tablet 1   escitalopram (LEXAPRO) 10 MG tablet Take 1.5 tablets (15 mg total) by mouth daily. 135 tablet 0   esomeprazole (NEXIUM) 40 MG capsule Take by mouth.     fenofibrate 160 MG tablet Take by mouth.     fenofibrate 160 MG tablet Take 1 tablet by mouth daily.     fluticasone (FLONASE) 50 MCG/ACT nasal spray      gabapentin (NEURONTIN) 300 MG capsule Take 300 mg by mouth. 2 CAP AM, 1 CAP Q noon, 2 CAP pm,     HYDROcodone bit-homatropine (HYCODAN) 5-1.5 MG/5ML syrup  (Patient  not taking: Reported on 10/03/2021)     LAGEVRIO 200 MG CAPS capsule Take by mouth. (Patient not taking: Reported on 10/03/2021)     lamoTRIgine (LAMICTAL) 150 MG tablet Take 0.5 tablets (75 mg total) by mouth 2 (two) times daily. 90 tablet 0   levothyroxine (SYNTHROID) 50 MCG tablet Take 1 tablet Monday, Wednesday, Friday. Take 1 1/2 all other days 105 tablet 3   levothyroxine (SYNTHROID) 75 MCG tablet TAKE ONE TABLET BY MOUTH EVERY MORNING BEFORE BREAKFAST (Patient not taking: Reported on 10/03/2021) 90 tablet 2   lisinopril (ZESTRIL) 10 MG tablet Take 10 mg by mouth daily.     lovastatin (MEVACOR) 20 MG tablet Take 20 mg by mouth at bedtime.     MAXITROL 3.5-10000-0.1 ophthalmic suspension Place 1 drop into the left eye 4 (four) times daily.     ondansetron (ZOFRAN-ODT) 4 MG disintegrating tablet      pantoprazole (PROTONIX) 40 MG tablet Take 1 tablet (40 mg total) by mouth daily. 30 tablet 1   prazosin (MINIPRESS) 2 MG capsule Take 1 capsule (2 mg total) by mouth at bedtime. 90 capsule 0   predniSONE (DELTASONE) 10 MG tablet  (Patient not taking: Reported on 10/03/2021)     propranolol (INNOPRAN XL) 80 MG 24 hr capsule Take by mouth.     Semaglutide,0.25 or 0.'5MG'$ /DOS, 2 MG/1.5ML SOPN Inject into the skin.     vitamin B-12 (CYANOCOBALAMIN) 1000 MCG tablet Take by mouth.  (Patient not taking: Reported on 06/06/2021)     No current facility-administered medications for this visit.     Musculoskeletal: Strength & Muscle Tone:  UTA Gait & Station:  Seated Patient leans: N/A  Psychiatric Specialty Exam: Review of Systems  Musculoskeletal:  Positive for myalgias.  Psychiatric/Behavioral:  Positive for sleep disturbance.   All other systems reviewed and are negative.  There were no vitals taken for this visit.There is no height or weight on file to calculate BMI.  General Appearance: Casual  Eye Contact:  Fair  Speech:  Clear and Coherent  Volume:  Normal  Mood:  Euthymic  Affect:   Congruent  Thought Process:  Goal Directed and Descriptions of Associations: Intact  Orientation:  Full (Time, Place, and Person)  Thought Content: Logical   Suicidal Thoughts:  No  Homicidal Thoughts:  No  Memory:  Immediate;   Fair Recent;   Fair Remote;   Fair  Judgement:  Fair  Insight:  Fair  Psychomotor Activity:  Normal  Concentration:  Concentration: Fair and Attention Span: Fair  Recall:  [employer] of Knowledge: Fair  Language: Fair  Akathisia:  No  Handed:  Right  AIMS (if indicated): done  Assets:  Communication Skills Desire for Improvement Housing Social Support  ADL's:  Intact  Cognition: WNL  Sleep:  Poor   Screenings: AIMS    Flowsheet Row Video Visit from 12/08/2021 in [facility] Total Score 0      GAD-7    Flowsheet Row Video Visit from 12/08/2021 in [place] Video Visit from 10/03/2021 in [facility]  Total GAD-7 Score 4 12      PHQ2-9    Flowsheet Row Video Visit from 12/08/2021 in [facility] Video Visit from 11/01/2021 in [place] Video Visit from 10/03/2021 in [place] Office Visit from 06/06/2021 in [place] Video Visit from 02/10/2021 in [place]  PHQ-2 Total Score '1 2 2 2 2  '$ PHQ-9 Total Score -- '4 9 7 11      '$ Flowsheet Row Video Visit from 12/08/2021 in [place] Video Visit from 11/01/2021 in [place] Video Visit from 10/03/2021 in [facility] Low Risk Low Risk Low Risk        Assessment and Plan: AYLINE DINGUS is a 62 year old Caucasian female, married, lives in [place], has a history of bipolar disorder, GAD, fibromyalgia, IBS was evaluated by telemedicine today.  Patient  with sleep problems, will benefit from the following plan.  Plan Bipolar disorder in remission Lexapro 15 mg p.o. daily Lamotrigine 150 mg p.o. daily in divided dosage Continue CBT with Ms. Miguel Dibble.  GAD-improving Lexapro 15 mg p.o. daily Klonopin 0.5 mg as needed for severe anxiety attacks Continue CBT  Insomnia-unstable Increase Sonata to 10 mg p.o. nightly as needed Reviewed Tool PMP aware Prazosin 2 mg p.o. nightly for nightmares. Continue sleep hygiene techniques  Bereavement- improving Continue CBT/Grief counseling.  Risk for prolonged QT syndrome-11/16/2021.  Patient to continue to follow-up with cardiology. Component 3 wk ago  Vent Rate (bpm)  80   PR Interval (msec)  162   QRS Interval (msec)  96   QT Interval (msec)  430   QTc (msec)  495     Follow-up in clinic in 3 to 4 weeks or sooner if needed.  Collaboration of Care: Collaboration of Care: Referral or follow-up with counselor/therapist AEB encouraged to follow up with therapist.  Encouraged to follow up with cardiology.  Patient/Guardian was advised Release of Information must be obtained prior to any record release in order to collaborate their care with an outside provider. Patient/Guardian was advised if they have not already done so to contact the registration department to sign all necessary forms in order for [place] to release information regarding their care.   Consent: Patient/Guardian gives verbal consent for treatment and assignment of benefits for services provided during this visit. Patient/Guardian expressed understanding and  agreed to proceed.   This note was generated in part or whole with voice recognition software. Voice recognition is usually quite accurate but there are transcription errors that can and very often do occur. I apologize for any typographical errors that were not detected and corrected.      Ursula Alert, MD 12/08/2021, 9:45 PM

## 2022-01-05 ENCOUNTER — Telehealth: Payer: Medicare PPO | Admitting: Psychiatry

## 2022-01-11 ENCOUNTER — Other Ambulatory Visit: Payer: Self-pay | Admitting: Psychiatry

## 2022-01-11 DIAGNOSIS — G4701 Insomnia due to medical condition: Secondary | ICD-10-CM

## 2022-01-12 DIAGNOSIS — Z85828 Personal history of other malignant neoplasm of skin: Secondary | ICD-10-CM | POA: Insufficient documentation

## 2022-01-12 DIAGNOSIS — I110 Hypertensive heart disease with heart failure: Secondary | ICD-10-CM | POA: Insufficient documentation

## 2022-01-12 DIAGNOSIS — Z794 Long term (current) use of insulin: Secondary | ICD-10-CM | POA: Diagnosis not present

## 2022-01-12 DIAGNOSIS — R109 Unspecified abdominal pain: Secondary | ICD-10-CM | POA: Diagnosis present

## 2022-01-12 DIAGNOSIS — R197 Diarrhea, unspecified: Secondary | ICD-10-CM | POA: Diagnosis not present

## 2022-01-12 DIAGNOSIS — J45909 Unspecified asthma, uncomplicated: Secondary | ICD-10-CM | POA: Insufficient documentation

## 2022-01-12 DIAGNOSIS — N179 Acute kidney failure, unspecified: Secondary | ICD-10-CM | POA: Diagnosis not present

## 2022-01-12 DIAGNOSIS — I509 Heart failure, unspecified: Secondary | ICD-10-CM | POA: Insufficient documentation

## 2022-01-12 DIAGNOSIS — Z79899 Other long term (current) drug therapy: Secondary | ICD-10-CM | POA: Diagnosis not present

## 2022-01-12 LAB — BASIC METABOLIC PANEL
Anion gap: 8 (ref 5–15)
BUN: 20 mg/dL (ref 8–23)
CO2: 23 mmol/L (ref 22–32)
Calcium: 9.1 mg/dL (ref 8.9–10.3)
Chloride: 107 mmol/L (ref 98–111)
Creatinine, Ser: 1.4 mg/dL — ABNORMAL HIGH (ref 0.44–1.00)
GFR, Estimated: 43 mL/min — ABNORMAL LOW (ref >60)
Glucose, Bld: 121 mg/dL — ABNORMAL HIGH (ref 70–99)
Potassium: 3.5 mmol/L (ref 3.5–5.1)
Sodium: 138 mmol/L (ref 135–145)

## 2022-01-12 LAB — CBC
HCT: 36.8 % (ref 36.0–46.0)
Hemoglobin: 11.5 g/dL — ABNORMAL LOW (ref 12.0–15.0)
MCH: 27.8 pg (ref 26.0–34.0)
MCHC: 31.3 g/dL (ref 30.0–36.0)
MCV: 88.9 fL (ref 80.0–100.0)
Platelets: 322 10*3/uL (ref 150–400)
RBC: 4.14 MIL/uL (ref 3.87–5.11)
RDW: 15.6 % — ABNORMAL HIGH (ref 11.5–15.5)
WBC: 7.9 10*3/uL (ref 4.0–10.5)
nRBC: 0 % (ref 0.0–0.2)

## 2022-01-12 LAB — URINALYSIS, ROUTINE W REFLEX MICROSCOPIC
Glucose, UA: NEGATIVE mg/dL
Hgb urine dipstick: NEGATIVE
Ketones, ur: NEGATIVE mg/dL
Nitrite: NEGATIVE
Protein, ur: 30 mg/dL — AB
Specific Gravity, Urine: 1.034 — ABNORMAL HIGH (ref 1.005–1.030)
pH: 5 (ref 5.0–8.0)

## 2022-01-12 NOTE — ED Triage Notes (Addendum)
Pt comes pov with left flank pain since yesterday. Pt also had 24 hours of diarrhea yesterday. No n/v. Pt went to UC here and they sent her here. Pt takes ozempic and UC was concerned for that. Pt also states her BP is usually pretty high.

## 2022-01-12 NOTE — ED Provider Triage Note (Signed)
Emergency Medicine Provider Triage Evaluation Note  SHANINE KREIGER , a 61 y.o. female  was evaluated in triage.  Pt complains of left flank pain that started yesterday and has progressed today. Stabbing, intermittent pain. No history of kidney stone. Pain increases with movement.  Physical Exam  BP 106/71   Pulse 84   Temp 98.3 F (36.8 C) (Oral)   Resp 18   Wt 70.8 kg   SpO2 98%   BMI 26.36 kg/m  Gen:   Awake, no distress   Resp:  Normal effort  MSK:   Moves extremities without difficulty  Other:  Left CVA tenderness.  Medical Decision Making  Medically screening exam initiated at 5:05 PM.  Appropriate orders placed.  Keagan WINNIE UMALI was informed that the remainder of the evaluation will be completed by another provider, this initial triage assessment does not replace that evaluation, and the importance of remaining in the ED until their evaluation is complete.    Victorino Dike, FNP 01/12/22 1710

## 2022-01-13 ENCOUNTER — Other Ambulatory Visit: Payer: Medicare PPO

## 2022-01-13 ENCOUNTER — Emergency Department
Admission: EM | Admit: 2022-01-13 | Discharge: 2022-01-13 | Disposition: A | Discharge status: Home / Self Care | Payer: Medicare PPO | Attending: Emergency Medicine | Admitting: Emergency Medicine

## 2022-01-13 ENCOUNTER — Emergency Department: Payer: Medicare PPO

## 2022-01-13 DIAGNOSIS — R109 Unspecified abdominal pain: Secondary | ICD-10-CM

## 2022-01-13 DIAGNOSIS — N179 Acute kidney failure, unspecified: Secondary | ICD-10-CM

## 2022-01-13 DIAGNOSIS — R197 Diarrhea, unspecified: Secondary | ICD-10-CM

## 2022-01-13 LAB — HEPATIC FUNCTION PANEL
ALT: 15 U/L (ref 0–44)
AST: 18 U/L (ref 15–41)
Albumin: 4.5 g/dL (ref 3.5–5.0)
Alkaline Phosphatase: 74 U/L (ref 38–126)
Bilirubin, Direct: <0.1 mg/dL (ref 0.0–0.2)
Total Bilirubin: 0.7 mg/dL (ref 0.3–1.2)
Total Protein: 7.4 g/dL (ref 6.5–8.1)

## 2022-01-13 LAB — LIPASE, BLOOD: Lipase: 51 U/L (ref 11–51)

## 2022-01-13 MED ORDER — ONDANSETRON 4 MG PO TBDP: 4.0000 mg | ORAL_TABLET | Freq: Four times a day (QID) | ORAL | 0 refills | Status: DC | PRN

## 2022-01-13 MED ORDER — MORPHINE SULFATE (PF) 4 MG/ML IV SOLN
4.0000 mg | Freq: Once | INTRAVENOUS | Status: AC
  Administered 2022-01-13: 4 mg via INTRAVENOUS
  Filled 2022-01-13: qty 1

## 2022-01-13 MED ORDER — OXYCODONE-ACETAMINOPHEN 5-325 MG PO TABS: 2.0000 | ORAL_TABLET | Freq: Four times a day (QID) | ORAL | 0 refills | Status: DC | PRN

## 2022-01-13 MED ORDER — ONDANSETRON HCL 4 MG/2ML IJ SOLN
4.0000 mg | Freq: Once | INTRAMUSCULAR | Status: AC
  Administered 2022-01-13: 4 mg via INTRAVENOUS
  Filled 2022-01-13: qty 2

## 2022-01-13 MED ORDER — SODIUM CHLORIDE 0.9 % IV BOLUS (SEPSIS)
1000.0000 mL | Freq: Once | INTRAVENOUS | Status: AC
  Administered 2022-01-13: 1000 mL via INTRAVENOUS

## 2022-01-13 MED ORDER — KETOROLAC TROMETHAMINE 30 MG/ML IJ SOLN
30.0000 mg | Freq: Once | INTRAMUSCULAR | Status: AC
  Administered 2022-01-13: 30 mg via INTRAVENOUS
  Filled 2022-01-13: qty 1

## 2022-01-13 NOTE — ED Notes (Signed)
Pt ambulatory and reports improvement in pain

## 2022-01-13 NOTE — Discharge Instructions (Addendum)
You are being provided a prescription for opiates (also known as narcotics) for pain control.  Opiates can be addictive and should only be used when absolutely necessary for pain control when other alternatives do not work.  We recommend you only use them for the recommended amount of time and only as prescribed.  Please do not take with other sedative medications or alcohol.  Please do not drive, operate machinery, make important decisions while taking opiates.  Please note that these medications can be addictive and have high abuse potential.  Patients can become addicted to narcotics after only taking them for a few days.  Please keep these medications locked away from children, teenagers or any family members with history of substance abuse.  Narcotic pain medicine may also make you constipated.  You may use over-the-counter medications such as MiraLAX, Colace to prevent constipation.  If you become constipated, you may use over-the-counter enemas as needed.  Itching and nausea are also common side effects of narcotic pain medication.  If you develop uncontrolled vomiting or a rash, please stop these medications and seek medical care.   Given your slightly elevated kidney function, I recommend avoiding NSAIDs such as aspirin, Aleve, ibuprofen.  It is safe to take Tylenol.  Your creatinine was elevated today at 1.4 today.  I recommend increasing your fluid intake and having your primary care doctor follow-up on this and recheck in 1 to 2 weeks.   You may take over-the-counter Imodium as needed for diarrhea.

## 2022-01-13 NOTE — ED Provider Notes (Signed)
[facility] Provider Note    Event Date/Time   First MD Initiated Contact with Patient 01/13/22 0104     (approximate)   History   Flank Pain   HPI  Kaylee Gordon is a 61 y.o. female with history of hypertension, disease who presents to the emergency department with her daughter for concerns for several days of diarrhea and then started having left flank pain.  She states the pain is worse with movement, standing, walking.  She denies any injury to the back.  No numbness, tingling or weakness.  No bowel or bladder incontinence.  No previous back surgery.  States she went to urgent care and they were concerned about possible kidney stone and sent her to the emergency department.  She has never had a kidney stone before.  No dysuria, hematuria, vaginal bleeding or discharge.  Has had previous appendectomy and C-section.  She denies any fevers, nausea or vomiting.   History provided by patient and daughter.    Past Medical History:  Diagnosis Date   Anxiety    Asthma    B12 deficiency    Cancer ([place])    SKIN CANCER   CHF (congestive heart failure) ([place])    Depression    Hypertension    Thyroid disease     Past Surgical History:  Procedure Laterality Date   ANKLE SURGERY Right 2012   APPENDECTOMY     AUGMENTATION MAMMAPLASTY Bilateral    BREAST ENHANCEMENT SURGERY     COLONOSCOPY WITH PROPOFOL N/A 02/22/2015   Procedure: COLONOSCOPY WITH PROPOFOL;  Surgeon: Josefine Class, MD;  Location: [facility] ENDOSCOPY;  Service: Endoscopy;  Laterality: N/A;   ESOPHAGOGASTRODUODENOSCOPY (EGD) WITH PROPOFOL N/A 02/22/2015   Procedure: ESOPHAGOGASTRODUODENOSCOPY (EGD) WITH PROPOFOL;  Surgeon: Josefine Class, MD;  Location: [facility] ENDOSCOPY;  Service: Endoscopy;  Laterality: N/A;   ESOPHAGOGASTRODUODENOSCOPY (EGD) WITH PROPOFOL N/A 02/11/2016   Procedure: ESOPHAGOGASTRODUODENOSCOPY (EGD) WITH PROPOFOL;  Surgeon: Manya Silvas, MD;  Location: [facility] ENDOSCOPY;   Service: Endoscopy;  Laterality: N/A;   HEMORRHOID SURGERY     NASAL SINUS SURGERY     SAVORY DILATION N/A 02/22/2015   Procedure: SAVORY DILATION;  Surgeon: Josefine Class, MD;  Location: [facility] ENDOSCOPY;  Service: Endoscopy;  Laterality: N/A;   SAVORY DILATION N/A 02/11/2016   Procedure: SAVORY DILATION;  Surgeon: Manya Silvas, MD;  Location: [facility] ENDOSCOPY;  Service: Endoscopy;  Laterality: N/A;   SKIN CANCER EXCISION      MEDICATIONS:  Prior to Admission medications   Medication Sig Start Date End Date Taking? Authorizing Provider  azelastine (ASTELIN) 0.1 % nasal spray Place into the nose. 10/11/18 03/18/20  [provider]  calcitRIOL (ROCALTROL) 0.25 MCG capsule TAKE ONE CAPSULE BY MOUTH DAILY 11/11/21   Elayne Snare, MD  clonazePAM (KLONOPIN) 0.5 MG tablet Take 0.5 tablets (0.25 mg total) by mouth 2 (two) times daily as needed for anxiety. 11/01/21   Ursula Alert, MD  escitalopram (LEXAPRO) 10 MG tablet Take 1.5 tablets (15 mg total) by mouth daily. 12/08/21   Ursula Alert, MD  fenofibrate 160 MG tablet Take by mouth. 08/16/20 08/16/21  [provider]  fenofibrate 160 MG tablet Take 1 tablet by mouth daily. 12/17/20   [provider]  fluticasone Asencion Islam) 50 MCG/ACT nasal spray  12/25/12   [provider]  gabapentin (NEURONTIN) 300 MG capsule Take 300 mg by mouth. 2 CAP AM, 1 CAP Q noon, 2 CAP pm, 10/06/19   [provider]  HYDROcodone bit-homatropine (HYCODAN) 5-1.5 MG/5ML syrup     [provider]  LAGEVRIO 200 MG CAPS capsule Take by mouth. Patient not taking: Reported on 10/03/2021 07/01/21   [provider]  lamoTRIgine (LAMICTAL) 150 MG tablet Take 0.5 tablets (75 mg total) by mouth 2 (two) times daily. 11/01/21   Ursula Alert, MD  levothyroxine (SYNTHROID) 50 MCG tablet Take 1 tablet Monday, Wednesday, Friday. Take 1 1/2 all other days 12/07/21   Elayne Snare, MD  levothyroxine (SYNTHROID) 75 MCG tablet TAKE ONE  TABLET BY MOUTH EVERY MORNING BEFORE BREAKFAST Patient not taking: Reported on 10/03/2021 06/20/21   Elayne Snare, MD  lisinopril (ZESTRIL) 10 MG tablet Take 10 mg by mouth daily. 05/26/19   [provider]  lovastatin (MEVACOR) 20 MG tablet Take 20 mg by mouth at bedtime.    [provider]  MAXITROL 3.5-10000-0.1 ophthalmic suspension Place 1 drop into the left eye 4 (four) times daily. 06/01/21   [provider]  ondansetron (ZOFRAN-ODT) 4 MG disintegrating tablet  09/09/19   [provider]  pantoprazole (PROTONIX) 40 MG tablet Take 1 tablet (40 mg total) by mouth daily. 12/27/18   Tyler Pita, MD  prazosin (MINIPRESS) 2 MG capsule Take 1 capsule (2 mg total) by mouth at bedtime. 11/01/21   Ursula Alert, MD  predniSONE (DELTASONE) 10 MG tablet  01/10/21   [provider]  propranolol (INNOPRAN XL) 80 MG 24 hr capsule Take by mouth. 07/23/19   [provider]  Semaglutide, 1 MG/DOSE, 4 MG/3ML SOPN Inject into the skin. 12/05/21   [provider]  Semaglutide,0.25 or 0.'5MG'$ /DOS, 2 MG/1.5ML SOPN Inject into the skin. 09/20/21   [provider]  vitamin B-12 (CYANOCOBALAMIN) 1000 MCG tablet Take by mouth.  Patient not taking: Reported on 06/06/2021    [provider]  zaleplon (SONATA) 10 MG capsule TAKE ONE CAPSULE BY MOUTH EVERY NIGHT AT BEDTIME AS NEEDED FOR SLEEP 01/11/22   Ursula Alert, MD    Physical Exam   Triage Vital Signs: ED Triage Vitals  Enc Vitals Group     BP 01/12/22 1659 99/81     Pulse Rate 01/12/22 1659 84     Resp 01/12/22 1659 18     Temp 01/12/22 1659 98.3 F (36.8 C)     Temp Source 01/12/22 1659 Oral     SpO2 01/12/22 1659 98 %     Weight 01/12/22 1700 156 lb (70.8 kg)     Height --      Head Circumference --      Peak Flow --      Pain Score 01/12/22 1700 10     Pain Loc --      Pain Edu? --      Excl. in [place]? --     Most recent vital signs: Vitals:   01/13/22 0302 01/13/22  0404  BP: 120/68 (!) 103/57  Pulse: 77 81  Resp: 16 16  Temp:    SpO2: 97% 100%    CONSTITUTIONAL: Alert and oriented and responds appropriately to questions. Well-appearing; well-nourished HEAD: Normocephalic, atraumatic EYES: Conjunctivae clear, pupils appear equal, sclera nonicteric ENT: normal nose; moist mucous membranes NECK: Supple, normal ROM CARD: RRR; S1 and S2 appreciated; no murmurs, no clicks, no rubs, no gallops RESP: Normal chest excursion without splinting or tachypnea; breath sounds clear and equal bilaterally; no wheezes, no rhonchi, no rales, no hypoxia or respiratory distress, speaking full sentences ABD/GI: Normal bowel sounds; non-distended; soft, non-tender, no  rebound, no guarding, no peritoneal signs BACK: The back appears normal, no tenderness over the midline spine and no step-off or deformity.  She does have some tenderness over the left flank and left lower paraspinal muscles.  No rash, redness, warmth, soft tissue swelling, ecchymosis. EXT: Normal ROM in all joints; no deformity noted, no edema; no cyanosis SKIN: Normal color for age and race; warm; no rash on exposed skin NEURO: Moves all extremities equally, normal speech, reports normal sensation, normal gait, no saddle anesthesia PSYCH: The patient's mood and manner are appropriate.   ED Results / Procedures / Treatments   LABS: (all labs ordered are listed, but only abnormal results are displayed) Labs Reviewed  URINALYSIS, ROUTINE W REFLEX MICROSCOPIC - Abnormal; Notable for the following components:      Result Value   Color, Urine AMBER (*)    APPearance HAZY (*)    Specific Gravity, Urine 1.034 (*)    Bilirubin Urine SMALL (*)    Protein, ur 30 (*)    Leukocytes,Ua TRACE (*)    Bacteria, UA FEW (*)    All other components within normal limits  BASIC METABOLIC PANEL - Abnormal; Notable for the following components:   Glucose, Bld 121 (*)    Creatinine, Ser 1.40 (*)    GFR, Estimated 43  (*)    All other components within normal limits  CBC - Abnormal; Notable for the following components:   Hemoglobin 11.5 (*)    RDW 15.6 (*)    All other components within normal limits  HEPATIC FUNCTION PANEL  LIPASE, BLOOD     EKG:   RADIOLOGY: My personal review and interpretation of imaging: CT scan shows no hydronephrosis, ureterolithiasis or other acute abnormality.  No osseous abnormality.  I have personally reviewed all radiology reports.   CT Renal Stone Study  Result Date: 01/13/2022 CLINICAL DATA:  Flank pain, kidney stone suspected.  Left flank pain EXAM: CT ABDOMEN AND PELVIS WITHOUT CONTRAST TECHNIQUE: Multidetector CT imaging of the abdomen and pelvis was performed following the standard protocol without IV contrast. RADIATION DOSE REDUCTION: This exam was performed according to the departmental dose-optimization program which includes automated exposure control, adjustment of the mA and/or kV according to patient size and/or use of iterative reconstruction technique. COMPARISON:  02/22/2021 FINDINGS: Lower chest: No acute abnormality.  Moderate-sized hiatal hernia. Hepatobiliary: No focal hepatic abnormality. Gallbladder unremarkable. Pancreas: No focal abnormality or ductal dilatation. Spleen: No focal abnormality.  Normal size. Adrenals/Urinary Tract: No adrenal abnormality. No focal renal abnormality. No stones or hydronephrosis. Urinary bladder is unremarkable. Stomach/Bowel: Stomach, large and small bowel grossly unremarkable. Vascular/Lymphatic: No evidence of aneurysm or adenopathy. Reproductive: Uterus and adnexa unremarkable.  No mass. Other: No free fluid or free air. Musculoskeletal: No acute bony abnormality. IMPRESSION: No renal or ureteral stones.  No hydronephrosis. No acute findings. Moderate-sized hiatal hernia. Electronically Signed   By: Rolm Baptise M.D.   On: 01/13/2022 00:15     PROCEDURES:  Critical Care performed:  No      Procedures    IMPRESSION / MDM / ASSESSMENT AND PLAN / ED COURSE  I reviewed the triage vital signs and the nursing notes.    Patient here with complaints of flank pain, diarrhea.  No vomiting.    DIFFERENTIAL DIAGNOSIS (includes but not limited to):   Kidney stone, pyelonephritis, UTI, colitis, diverticulitis, musculoskeletal back pain, muscle strain, muscle spasm, less likely fracture, doubt cauda equina, epidural abscess or hematoma, discitis or osteomyelitis, critical spinal  stenosis, transverse myelitis   Patient's presentation is most consistent with acute presentation with potential threat to life or bodily function.   PLAN: We will obtain CBC, CMP, lipase, urinalysis, CT renal study.  We will give IV fluids, pain and nausea medicine.   MEDICATIONS GIVEN IN ED: Medications  sodium chloride 0.9 % bolus 1,000 mL (0 mLs Intravenous Stopped 01/13/22 0347)  ketorolac (TORADOL) 30 MG/ML injection 30 mg (30 mg Intravenous Given 01/13/22 0202)  ondansetron (ZOFRAN) injection 4 mg (4 mg Intravenous Given 01/13/22 0203)  morphine (PF) 4 MG/ML injection 4 mg (4 mg Intravenous Given 01/13/22 0203)  sodium chloride 0.9 % bolus 1,000 mL (0 mLs Intravenous Stopped 01/13/22 0359)  morphine (PF) 4 MG/ML injection 4 mg (4 mg Intravenous Given 01/13/22 0301)     ED COURSE: Labs show no leukocytosis, stable hemoglobin.  Normal electrolytes.  Creatinine mildly elevated compared to previous at 1.4.  It looks like she normally runs around 1.2.  She did unfortunately already received Toradol for pain but is now getting aggressive hydration.  Her urine does show some trace leukocyte esterase and few bacteria but no other sign of infection and she is not having urinary symptoms.  CT reviewed and interpreted by myself and radiologist and shows no acute abnormality including hydronephrosis, ureterolithiasis, pyelonephritis, colitis, diverticulitis.  Patient feeling better after pain medication  and able to ambulate, tolerate p.o. here.  I recommended increase fluid intake, avoiding NSAIDs and follow-up with her doctor in 1 week to reevaluate her AKI.  Have also recommended pain medication for what I suspect is musculoskeletal back pain and alternating heat, ice, stretching and no heavy lifting.  She states urgent care was also concerned that symptoms could be secondary to Ozempic but she states she has been on the Ozempic for several months and has been on the highest dose of Ozempic for several weeks and has not had any symptoms.  I do not think this is what caused her acute diarrhea or back pain today but given the kidney function I do think she needs close outpatient follow-up since she is on this medication has a mild AKI.  At this time, I do not feel there is any life-threatening condition present. I reviewed all nursing notes, vitals, pertinent previous records.  All lab and urine results, EKGs, imaging ordered have been independently reviewed and interpreted by myself.  I reviewed all available radiology reports from any imaging ordered this visit.  Based on my assessment, I feel the patient is safe to be discharged home without further emergent workup and can continue workup as an outpatient as needed. Discussed all findings, treatment plan as well as usual and customary return precautions with pt and daughter.  They verbalize understanding and are comfortable with this plan.  Outpatient follow-up has been provided as needed.  All questions have been answered.    CONSULTS: Patient feeling better.  Work-up reassuring other than mild AKI that does not require admission at this time.  She did receive 2 L of IV fluids in the ED.   OUTSIDE RECORDS REVIEWED: Reviewed patient's last office visit with Dr. Netty Starring on 09/19/2021.       FINAL CLINICAL IMPRESSION(S) / ED DIAGNOSES   Final diagnoses:  Left flank pain  Diarrhea, unspecified type  AKI (acute kidney injury) ([place])     Rx  / DC Orders   ED Discharge Orders          Ordered    oxyCODONE-acetaminophen (PERCOCET) 5-325 MG  tablet  Every 6 hours PRN        01/13/22 0359    ondansetron (ZOFRAN-ODT) 4 MG disintegrating tablet  Every 6 hours PRN        01/13/22 0359             Note:  This document was prepared using Dragon voice recognition software and may include unintentional dictation errors.   Dayzee Trower, Delice Bison, DO 01/13/22 [phone]

## 2022-01-20 DIAGNOSIS — S76012A Strain of muscle, fascia and tendon of left hip, initial encounter: Secondary | ICD-10-CM | POA: Insufficient documentation

## 2022-01-20 DIAGNOSIS — M7062 Trochanteric bursitis, left hip: Secondary | ICD-10-CM | POA: Insufficient documentation

## 2022-01-24 ENCOUNTER — Encounter: Payer: Self-pay | Admitting: Psychiatry

## 2022-01-24 ENCOUNTER — Telehealth (INDEPENDENT_AMBULATORY_CARE_PROVIDER_SITE_OTHER): Payer: Medicare PPO | Admitting: Psychiatry

## 2022-01-24 DIAGNOSIS — F411 Generalized anxiety disorder: Secondary | ICD-10-CM

## 2022-01-24 DIAGNOSIS — Z634 Disappearance and death of family member: Secondary | ICD-10-CM | POA: Diagnosis not present

## 2022-01-24 DIAGNOSIS — G4701 Insomnia due to medical condition: Secondary | ICD-10-CM | POA: Diagnosis not present

## 2022-01-24 DIAGNOSIS — F3176 Bipolar disorder, in full remission, most recent episode depressed: Secondary | ICD-10-CM

## 2022-01-24 DIAGNOSIS — Z87898 Personal history of other specified conditions: Secondary | ICD-10-CM

## 2022-01-24 MED ORDER — PRAZOSIN HCL 2 MG PO CAPS: 2.0000 mg | ORAL_CAPSULE | Freq: Every day | ORAL | 0 refills | Status: DC

## 2022-01-24 MED ORDER — LAMOTRIGINE 25 MG PO TABS: 25.0000 mg | ORAL_TABLET | Freq: Every day | ORAL | 0 refills | Status: DC

## 2022-01-24 MED ORDER — CLONAZEPAM 0.5 MG PO TABS: 0.2500 mg | ORAL_TABLET | Freq: Every day | ORAL | 1 refills | Status: DC | PRN

## 2022-01-24 MED ORDER — LAMOTRIGINE 150 MG PO TABS
75.0000 mg | ORAL_TABLET | Freq: Two times a day (BID) | ORAL | 0 refills | Status: DC
Start: 2022-01-24 — End: 2022-02-12

## 2022-01-24 NOTE — Progress Notes (Unsigned)
Virtual Visit via Video Note  I connected with Kaylee Gordon on 01/24/22 at  1:00 PM EDT by a video enabled telemedicine application and verified that I am speaking with the correct person using two identifiers.  Location Provider Location : [facility] Patient Location : Home  Participants: Patient , Provider    I discussed the limitations of evaluation and management by telemedicine and the availability of in person appointments. The patient expressed understanding and agreed to proceed.    I discussed the assessment and treatment plan with the patient. The patient was provided an opportunity to ask questions and all were answered. The patient agreed with the plan and demonstrated an understanding of the instructions.   The patient was advised to call back or seek an in-person evaluation if the symptoms worsen or if the condition fails to improve as anticipated.  [facility] MD OP Progress Note  01/24/2022 1:30 PM Kaylee Gordon  MRN:  [id number]  Chief Complaint:  Chief Complaint  Patient presents with   Follow-up: 61 year old Caucasian female with history of bipolar disorder, anxiety, insomnia, fibromyalgia, history of QT prolongation, presented with worsening anxiety.   HPI: Kaylee Gordon is a 61 year old Caucasian female who has a history of bipolar disorder, GAD, insomnia, fibromyalgia, married, lives in [place], was evaluated by telemedicine today.  Patient today reports her depression is currently under control.  She however has been anxious a lot in the past few weeks.  She reports she often feels restless, fidgety, has trouble relaxing.  Patient reports her therapist also noticed this when she went to see her for her session.  Patient reports she has been using the clonazepam however has been trying to limit use and uses it 1-2 times a week only.  Patient is aware about habit forming potential of clonazepam.  Patient reports she also has a lot of psychosocial stressors.  She  reports she continues to have relationship struggles with her spouse, reports the house is currently crowded with 4 dogs and her son and her spouse.  Her daughter is planning to move to [place] soon.  That also worries her.  Patient reports sleep problems.  She reports she does not have a good sleep hygiene.  Takes her medication-Sonata around 2 AM.  She reports she watches TV until then.  She is able to fall asleep when she takes her medication and stays in bed until 10 AM.  Patient is aware she needs to work on her sleep hygiene.  Patient denies any suicidality, homicidality or perceptual disturbances.  Currently compliant on her medications.  Denies side effects.  Visit Diagnosis:    ICD-10-CM   1. Bipolar disorder, in full remission, most recent episode depressed (HCC)  F31.76 lamoTRIgine (LAMICTAL) 150 MG tablet    2. GAD (generalized anxiety disorder)  F41.1 lamoTRIgine (LAMICTAL) 25 MG tablet    clonazePAM (KLONOPIN) 0.5 MG tablet    lamoTRIgine (LAMICTAL) 150 MG tablet    3. Insomnia due to medical condition  G47.01 prazosin (MINIPRESS) 2 MG capsule   anxiety, grief    4. Bereavement  Z63.4     5. History of prolonged Q-T interval on ECG  Z87.898       Past Psychiatric History: Reviewed past psychiatric history from progress note on 09/11/2018.  Past Medical History:  Past Medical History:  Diagnosis Date   Anxiety    Asthma    B12 deficiency    Cancer ([place])    SKIN CANCER   CHF (congestive  heart failure) ([place])    Depression    Hypertension    Thyroid disease     Past Surgical History:  Procedure Laterality Date   ANKLE SURGERY Right 2012   APPENDECTOMY     AUGMENTATION MAMMAPLASTY Bilateral    BREAST ENHANCEMENT SURGERY     COLONOSCOPY WITH PROPOFOL N/A 02/22/2015   Procedure: COLONOSCOPY WITH PROPOFOL;  Surgeon: Josefine Class, MD;  Location: [facility] ENDOSCOPY;  Service: Endoscopy;  Laterality: N/A;   ESOPHAGOGASTRODUODENOSCOPY (EGD) WITH PROPOFOL N/A 02/22/2015    Procedure: ESOPHAGOGASTRODUODENOSCOPY (EGD) WITH PROPOFOL;  Surgeon: Josefine Class, MD;  Location: [facility] ENDOSCOPY;  Service: Endoscopy;  Laterality: N/A;   ESOPHAGOGASTRODUODENOSCOPY (EGD) WITH PROPOFOL N/A 02/11/2016   Procedure: ESOPHAGOGASTRODUODENOSCOPY (EGD) WITH PROPOFOL;  Surgeon: Manya Silvas, MD;  Location: [facility] ENDOSCOPY;  Service: Endoscopy;  Laterality: N/A;   HEMORRHOID SURGERY     NASAL SINUS SURGERY     SAVORY DILATION N/A 02/22/2015   Procedure: SAVORY DILATION;  Surgeon: Josefine Class, MD;  Location: [facility] ENDOSCOPY;  Service: Endoscopy;  Laterality: N/A;   SAVORY DILATION N/A 02/11/2016   Procedure: SAVORY DILATION;  Surgeon: Manya Silvas, MD;  Location: [facility] ENDOSCOPY;  Service: Endoscopy;  Laterality: N/A;   SKIN CANCER EXCISION      Family Psychiatric History: Reviewed family psychiatric history from progress note on 09/11/2018.  Family History:  Family History  Problem Relation Age of Onset   Cancer Mother        colon cancer   Depression Mother    Anxiety disorder Mother    Heart disease Father    Cancer Father        ? neck cancer- still living.    Anxiety disorder Father    Depression Father    Hypoparathyroidism Son    Colon cancer Maternal Aunt        died in 10s.     Social History: Reviewed social history from progress note on 09/11/2018. Social History   Socioeconomic History   Marital status: Married    Spouse name: Legrand Como   Number of children: Not on file   Years of education: Not on file   Highest education level: Not on file  Occupational History   Not on file  Tobacco Use   Smoking status: Never   Smokeless tobacco: Never  Vaping Use   Vaping Use: Not on file  Substance and Sexual Activity   Alcohol use: No    Alcohol/week: 0.0 standard drinks of alcohol    Comment: MAYBE ONCE OR TWICE A MONTH   Drug use: No   Sexual activity: Not Currently    Partners: Male  Other Topics Concern   Not on file  Social History  Narrative   Lives in [place]; taught high school- math; retd; no smoking; social alcohol.    Social Determinants of Health   Financial Resource Strain: Low Risk  (05/16/2018)   Overall Financial Resource Strain (CARDIA)    Difficulty of Paying Living Expenses: Not hard at all  Food Insecurity: No Food Insecurity (05/16/2018)   Hunger Vital Sign    Worried About Running Out of Food in the Last Year: Never true    Ran Out of Food in the Last Year: Never true  Transportation Needs: No Transportation Needs (05/16/2018)   PRAPARE - Hydrologist (Medical): No    Lack of Transportation (Non-Medical): No  Physical Activity: Unknown (05/16/2018)   Exercise Vital Sign    Days of  Exercise per Week: 0 days    Minutes of Exercise per Session: Not on file  Stress: Stress Concern Present (05/16/2018)   [place]    Feeling of Stress : Very much  Social Connections: Unknown (05/16/2018)   Social Connection and Isolation Panel [NHANES]    Frequency of Communication with Friends and Family: Twice a week    Frequency of Social Gatherings with Friends and Family: Twice a week    Attends Religious Services: More than 4 times per year    Active Member of [employer] or Organizations: Not on file    Attends Archivist Meetings: Not on file    Marital Status: Married    Allergies:  Allergies  Allergen Reactions   Levofloxacin Other (See Comments) and Nausea Only    Lethargic, Flu-like symptoms, Hot flashes    Atorvastatin    Hydrochlorothiazide Other (See Comments)    Intolerance - dry lips   Parafon Forte Dsc [Chlorzoxazone]    Pollen Extract    Sudafed [Pseudoephedrine Hcl]     Heart racing    Metabolic Disorder Labs: Lab Results  Component Value Date   HGBA1C 5.9 (H) 02/16/2021   Lab Results  Component Value Date   PROLACTIN 18.3 07/22/2014   Lab Results  Component Value Date    CHOL 184 02/16/2021   TRIG 179 (H) 02/16/2021   HDL 37 (L) 02/16/2021   CHOLHDL 5.0 (H) 02/16/2021   LDLCALC 115 (H) 02/16/2021   Lab Results  Component Value Date   TSH 3.30 12/05/2021   TSH 2.510 02/16/2021    Therapeutic Level Labs: No results found for: "LITHIUM" Lab Results  Component Value Date   VALPROATE 39 (L) 02/04/2019   No results found for: "CBMZ"  Current Medications: Current Outpatient Medications  Medication Sig Dispense Refill   calcitRIOL (ROCALTROL) 0.25 MCG capsule TAKE ONE CAPSULE BY MOUTH DAILY 90 capsule 1   cyclobenzaprine (FLEXERIL) 5 MG tablet Take 5 mg by mouth 3 (three) times daily as needed.     escitalopram (LEXAPRO) 10 MG tablet Take 1.5 tablets (15 mg total) by mouth daily. 135 tablet 0   fenofibrate 160 MG tablet Take 1 tablet by mouth daily.     fluticasone (FLONASE) 50 MCG/ACT nasal spray      gabapentin (NEURONTIN) 300 MG capsule Take 300 mg by mouth. 2 CAP AM, 1 CAP Q noon, 2 CAP pm,     lamoTRIgine (LAMICTAL) 25 MG tablet Take 1 tablet (25 mg total) by mouth daily. Take along with 150 mg daily 90 tablet 0   levothyroxine (SYNTHROID) 50 MCG tablet Take 1 tablet Monday, Wednesday, Friday. Take 1 1/2 all other days 105 tablet 3   levothyroxine (SYNTHROID) 75 MCG tablet TAKE ONE TABLET BY MOUTH EVERY MORNING BEFORE BREAKFAST 90 tablet 2   lisinopril (ZESTRIL) 10 MG tablet Take 10 mg by mouth daily.     lovastatin (MEVACOR) 20 MG tablet Take 20 mg by mouth at bedtime.     MAXITROL 3.5-10000-0.1 ophthalmic suspension Place 1 drop into the left eye 4 (four) times daily.     pantoprazole (PROTONIX) 40 MG tablet Take 1 tablet (40 mg total) by mouth daily. 30 tablet 1   propranolol (INNOPRAN XL) 80 MG 24 hr capsule Take by mouth.     Semaglutide, 1 MG/DOSE, 4 MG/3ML SOPN Inject into the skin.     Semaglutide, 2 MG/DOSE, 8 MG/3ML SOPN Inject into the skin.  Semaglutide,0.25 or 0.'5MG'$ /DOS, 2 MG/1.5ML SOPN Inject into the skin.     zaleplon (SONATA)  10 MG capsule TAKE ONE CAPSULE BY MOUTH EVERY NIGHT AT BEDTIME AS NEEDED FOR SLEEP 30 capsule 1   azelastine (ASTELIN) 0.1 % nasal spray Place into the nose.     [START ON 02/12/2022] clonazePAM (KLONOPIN) 0.5 MG tablet Take 0.5 tablets (0.25 mg total) by mouth daily as needed for anxiety. 15 tablet 1   lamoTRIgine (LAMICTAL) 150 MG tablet Take 0.5 tablets (75 mg total) by mouth 2 (two) times daily. 90 tablet 0   oxyCODONE-acetaminophen (PERCOCET) 5-325 MG tablet Take 2 tablets by mouth every 6 (six) hours as needed for severe pain. (Patient not taking: Reported on 01/24/2022) 14 tablet 0   prazosin (MINIPRESS) 2 MG capsule Take 1 capsule (2 mg total) by mouth at bedtime. 90 capsule 0   predniSONE (DELTASONE) 10 MG tablet  (Patient not taking: Reported on 10/03/2021)     vitamin B-12 (CYANOCOBALAMIN) 1000 MCG tablet Take by mouth.  (Patient not taking: Reported on 01/24/2022)     No current facility-administered medications for this visit.     Musculoskeletal: Strength & Muscle Tone:  UTA Gait & Station:  Seated Patient leans: N/A  Psychiatric Specialty Exam: Review of Systems  Musculoskeletal:  Positive for arthralgias, back pain and myalgias.  Psychiatric/Behavioral:  Positive for sleep disturbance. The patient is nervous/anxious.   All other systems reviewed and are negative.   There were no vitals taken for this visit.There is no height or weight on file to calculate BMI.  General Appearance: Casual  Eye Contact:  Fair  Speech:  Normal Rate  Volume:  Normal  Mood:  Anxious  Affect:  Congruent  Thought Process:  Goal Directed and Descriptions of Associations: Intact  Orientation:  Full (Time, Place, and Person)  Thought Content: Logical   Suicidal Thoughts:  No  Homicidal Thoughts:  No  Memory:  Immediate;   Fair Recent;   Fair Remote;   Fair  Judgement:  Fair  Insight:  Fair  Psychomotor Activity:  Restlessness  Concentration:  Concentration: Fair and Attention Span: Fair   Recall:  [employer] of Knowledge: Fair  Language: Fair  Akathisia:  No  Handed:  Right  AIMS (if indicated): not done  Assets:  Communication Skills Desire for Improvement Housing Social Support  ADL's:  Intact  Cognition: WNL  Sleep:   restless, falling asleep problem due to lack of sleep hygiene   Screenings: AIMS    Flowsheet Row Video Visit from 12/08/2021 in [facility] Total Score 0      GAD-7    Flowsheet Row Video Visit from 01/24/2022 in [facility] Video Visit from 12/08/2021 in [facility] Video Visit from 10/03/2021 in [facility]  Total GAD-7 Score '10 4 12      '$ PHQ2-9    Flowsheet Row Video Visit from 01/24/2022 in [facility] Video Visit from 12/08/2021 in [facility] Video Visit from 11/01/2021 in [facility] Video Visit from 10/03/2021 in [facility] Visit from 06/06/2021 in [facility]  PHQ-2 Total Score 0 '1 2 2 2  '$ PHQ-9 Total Score -- -- '4 9 7      '$ Flowsheet Row Video Visit from 01/24/2022 in [facility] from 01/13/2022 in [facility] Video Visit from 12[facility]2021 [facility]  C-SSRS RISK CATEGORY Low Risk No Risk Low Risk        Assessment and Plan: NAKYLA BRACCO is a 61 year old Caucasian female, married, lives in [place], has a history of bipolar disorder, GAD, fibromyalgia, IBS, history of QT prolongation on EKG was evaluated by telemedicine today.  Patient currently struggling with anxiety, sleep problems, will benefit from the following plan.  Plan Bipolar disorder in remission Lexapro 15 mg p.o. daily Lamotrigine as prescribed Continue CBT with Ms. Miguel Dibble  GAD-unstable Lexapro 15 mg p.o. daily.  We will consider increasing the dosage of Lexapro however patient to have a cardiology consultation and repeat EKG prior to that. Klonopin 0.5 mg as needed for severe anxiety, patient to limit use. Increase lamotrigine to 175 mg p.o. daily in divided dosage Continue CBT.  Insomnia-improving Patient to work on sleep hygiene techniques.  Provided education. Continue Sonata 10 mg p.o. nightly as needed Prazosin 2 mg p.o. nightly Reviewed [place] PMP aware   Bereavement-improving Continue CBT  History of prolonged QT syndrome-patient advised to follow up with cardiology.  Patient will need repeat EKG prior to making more medication changes as needed.   Follow-up in clinic in 2 weeks or sooner if needed.  This note was generated in part or whole with voice recognition software. Voice recognition is usually quite accurate but there are transcription errors that can and very often do occur. I apologize for any typographical errors that were not detected and corrected.    Collaboration of Care: Collaboration of Care: Referral or follow-up with counselor/therapist AEB encouraged to follow up with therapist as well as cardiologist as noted above.  Patient/Guardian was advised Release of Information must be obtained prior to any record release in order to collaborate their care with an outside provider. Patient/Guardian was advised if they have not already done so to contact the registration department to sign all necessary forms in order for [place] to release information regarding their care.   Consent: Patient/Guardian gives verbal consent for treatment and assignment of benefits for services provided during this visit. Patient/Guardian expressed understanding and agreed to proceed.   This note was generated in part or whole with voice recognition software. Voice recognition is usually quite accurate but there are transcription errors that can and very  often do occur. I apologize for any typographical errors that were not detected and corrected.      Ursula Alert, MD 01/25/2022, 12:42 PM

## 2022-02-06 ENCOUNTER — Telehealth (INDEPENDENT_AMBULATORY_CARE_PROVIDER_SITE_OTHER): Payer: Medicare PPO | Admitting: Psychiatry

## 2022-02-06 ENCOUNTER — Encounter: Payer: Self-pay | Admitting: Psychiatry

## 2022-02-06 DIAGNOSIS — F411 Generalized anxiety disorder: Secondary | ICD-10-CM

## 2022-02-06 DIAGNOSIS — G4701 Insomnia due to medical condition: Secondary | ICD-10-CM | POA: Diagnosis not present

## 2022-02-06 DIAGNOSIS — F3176 Bipolar disorder, in full remission, most recent episode depressed: Secondary | ICD-10-CM | POA: Diagnosis not present

## 2022-02-06 DIAGNOSIS — Z634 Disappearance and death of family member: Secondary | ICD-10-CM

## 2022-02-06 DIAGNOSIS — Z87898 Personal history of other specified conditions: Secondary | ICD-10-CM

## 2022-02-06 NOTE — Progress Notes (Unsigned)
Virtual Visit via Video Note  I connected with Kaylee Gordon on 02/06/22 at 11:30 AM EDT by a video enabled telemedicine application and verified that I am speaking with the correct person using two identifiers.  Location Provider Location : [facility] Patient Location : Home  Participants: Patient , Provider    I discussed the limitations of evaluation and management by telemedicine and the availability of in person appointments. The patient expressed understanding and agreed to proceed.   I discussed the assessment and treatment plan with the patient. The patient was provided an opportunity to ask questions and all were answered. The patient agreed with the plan and demonstrated an understanding of the instructions.   The patient was advised to call back or seek an in-person evaluation if the symptoms worsen or if the condition fails to improve as anticipated.    Cayce MD OP Progress Note  02/07/2022 7:04 AM Louann LESTINE RAHE  MRN:  [id number]  Chief Complaint:  Chief Complaint  Patient presents with   Follow-up: 61 year old Caucasian female with history of bipolar disorder, anxiety, insomnia, fibromyalgia, history of QT prolongation presented for medication management.   HPI: Kaylee Gordon is a 61 year old Caucasian female who has a history of bipolar disorder, GAD, fibromyalgia, married, lives in [place] was evaluated by telemedicine today.  Patient today reports she continues to have anxiety about multiple situational stressors.  Her daughter is currently trying to relocate to [facility].  She is also trying to find a new job.  Patient reports she continues to worry about her dad's estate.  She however has been following up with her therapist.  Reports she has upcoming appointment this week with Ms. Miguel Dibble.  Looks forward to that.  Currently compliant on the Lexapro and Lamictal.  Reports the Lamictal increased dosage has helped with her mood, currently denies any  significant sadness or crying spells.  Reports sleep is improving now that she is working on her sleep hygiene.  Does have Sonata available as needed.  Denies side effects.  Patient reports she has not been able to contact her cardiologist for cardiology clearance, repeat EKG prior to readjusting her Lexapro dosage.  She has a history of QT prolongation.  Patient agreeable to contact and get repeat EKG.  Patient denies any suicidality, homicidality or perceptual disturbances.  Patient denies any other concerns today.  Visit Diagnosis:    ICD-10-CM   1. Bipolar disorder, in full remission, most recent episode depressed ([place])  F31.76     2. GAD (generalized anxiety disorder)  F41.1     3. Insomnia due to medical condition  G47.01    Anxiety, grief    4. Bereavement  Z63.4     5. History of prolonged Q-T interval on ECG  Z87.898       Past Psychiatric History: Reviewed past psychiatric history from progress note on 09/11/2018.  Past Medical History:  Past Medical History:  Diagnosis Date   Anxiety    Asthma    B12 deficiency    Cancer ([place])    SKIN CANCER   CHF (congestive heart failure) ([place])    Depression    Hypertension    Thyroid disease     Past Surgical History:  Procedure Laterality Date   ANKLE SURGERY Right 2012   APPENDECTOMY     AUGMENTATION MAMMAPLASTY Bilateral    BREAST ENHANCEMENT SURGERY     COLONOSCOPY WITH PROPOFOL N/A 02/22/2015   Procedure: COLONOSCOPY WITH PROPOFOL;  Surgeon: Rodman Key  Elmyra Ricks, MD;  Location: [facility] ENDOSCOPY;  Service: Endoscopy;  Laterality: N/A;   ESOPHAGOGASTRODUODENOSCOPY (EGD) WITH PROPOFOL N/A 02/22/2015   Procedure: ESOPHAGOGASTRODUODENOSCOPY (EGD) WITH PROPOFOL;  Surgeon: Josefine Class, MD;  Location: [facility] ENDOSCOPY;  Service: Endoscopy;  Laterality: N/A;   ESOPHAGOGASTRODUODENOSCOPY (EGD) WITH PROPOFOL N/A 02/11/2016   Procedure: ESOPHAGOGASTRODUODENOSCOPY (EGD) WITH PROPOFOL;  Surgeon: Manya Silvas, MD;  Location:  [facility] ENDOSCOPY;  Service: Endoscopy;  Laterality: N/A;   HEMORRHOID SURGERY     NASAL SINUS SURGERY     SAVORY DILATION N/A 02/22/2015   Procedure: SAVORY DILATION;  Surgeon: Josefine Class, MD;  Location: [facility] ENDOSCOPY;  Service: Endoscopy;  Laterality: N/A;   SAVORY DILATION N/A 02/11/2016   Procedure: SAVORY DILATION;  Surgeon: Manya Silvas, MD;  Location: [facility] ENDOSCOPY;  Service: Endoscopy;  Laterality: N/A;   SKIN CANCER EXCISION      Family Psychiatric History: Reviewed family psychiatric history from progress note on 09/11/2018  Family History:  Family History  Problem Relation Age of Onset   Cancer Mother        colon cancer   Depression Mother    Anxiety disorder Mother    Heart disease Father    Cancer Father        ? neck cancer- still living.    Anxiety disorder Father    Depression Father    Hypoparathyroidism Son    Colon cancer Maternal Aunt        died in 65s.     Social History: Reviewed social history from progress note on 09/11/2018 Social History   Socioeconomic History   Marital status: Married    Spouse name: Legrand Como   Number of children: Not on file   Years of education: Not on file   Highest education level: Not on file  Occupational History   Not on file  Tobacco Use   Smoking status: Never   Smokeless tobacco: Never  Vaping Use   Vaping Use: Not on file  Substance and Sexual Activity   Alcohol use: No    Alcohol/week: 0.0 standard drinks of alcohol    Comment: MAYBE ONCE OR TWICE A MONTH   Drug use: No   Sexual activity: Not Currently    Partners: Male  Other Topics Concern   Not on file  Social History Narrative   Lives in [place]; taught high school- math; retd; no smoking; social alcohol.    Social Determinants of Health   Financial Resource Strain: Low Risk  (05/16/2018)   Overall Financial Resource Strain (CARDIA)    Difficulty of Paying Living Expenses: Not hard at all  Food Insecurity: No Food Insecurity  (05/16/2018)   Hunger Vital Sign    Worried About Running Out of Food in the Last Year: Never true    Ran Out of Food in the Last Year: Never true  Transportation Needs: No Transportation Needs (05/16/2018)   PRAPARE - Hydrologist (Medical): No    Lack of Transportation (Non-Medical): No  Physical Activity: Unknown (05/16/2018)   Exercise Vital Sign    Days of Exercise per Week: 0 days    Minutes of Exercise per Session: Not on file  Stress: Stress Concern Present (05/16/2018)   [place]    Feeling of Stress : Very much  Social Connections: Unknown (05/16/2018)   Social Connection and Isolation Panel [NHANES]    Frequency of Communication with Friends and Family: Twice a  week    Frequency of Social Gatherings with Friends and Family: Twice a week    Attends Religious Services: More than 4 times per year    Active Member of [employer] or Organizations: Not on file    Attends Archivist Meetings: Not on file    Marital Status: Married    Allergies:  Allergies  Allergen Reactions   Levofloxacin Other (See Comments) and Nausea Only    Lethargic, Flu-like symptoms, Hot flashes    Atorvastatin    Hydrochlorothiazide Other (See Comments)    Intolerance - dry lips   Parafon Forte Dsc [Chlorzoxazone]    Pollen Extract    Sudafed [Pseudoephedrine Hcl]     Heart racing    Metabolic Disorder Labs: Lab Results  Component Value Date   HGBA1C 5.9 (H) 02/16/2021   Lab Results  Component Value Date   PROLACTIN 18.3 07/22/2014   Lab Results  Component Value Date   CHOL 184 02/16/2021   TRIG 179 (H) 02/16/2021   HDL 37 (L) 02/16/2021   CHOLHDL 5.0 (H) 02/16/2021   LDLCALC 115 (H) 02/16/2021   Lab Results  Component Value Date   TSH 3.30 12/05/2021   TSH 2.510 02/16/2021    Therapeutic Level Labs: No results found for: "LITHIUM" Lab Results  Component Value Date    VALPROATE 39 (L) 02/04/2019   No results found for: "CBMZ"  Current Medications: Current Outpatient Medications  Medication Sig Dispense Refill   azelastine (ASTELIN) 0.1 % nasal spray Place into the nose.     calcitRIOL (ROCALTROL) 0.25 MCG capsule TAKE ONE CAPSULE BY MOUTH DAILY 90 capsule 1   [START ON 02/12/2022] clonazePAM (KLONOPIN) 0.5 MG tablet Take 0.5 tablets (0.25 mg total) by mouth daily as needed for anxiety. 15 tablet 1   cyclobenzaprine (FLEXERIL) 5 MG tablet Take 5 mg by mouth 3 (three) times daily as needed.     escitalopram (LEXAPRO) 10 MG tablet Take 1.5 tablets (15 mg total) by mouth daily. 135 tablet 0   fenofibrate 160 MG tablet Take 1 tablet by mouth daily.     fluticasone (FLONASE) 50 MCG/ACT nasal spray      gabapentin (NEURONTIN) 300 MG capsule Take 300 mg by mouth. 2 CAP AM, 1 CAP Q noon, 2 CAP pm,     lamoTRIgine (LAMICTAL) 150 MG tablet Take 0.5 tablets (75 mg total) by mouth 2 (two) times daily. 90 tablet 0   lamoTRIgine (LAMICTAL) 25 MG tablet Take 1 tablet (25 mg total) by mouth daily. Take along with 150 mg daily 90 tablet 0   levothyroxine (SYNTHROID) 50 MCG tablet Take 1 tablet Monday, Wednesday, Friday. Take 1 1/2 all other days 105 tablet 3   levothyroxine (SYNTHROID) 75 MCG tablet TAKE ONE TABLET BY MOUTH EVERY MORNING BEFORE BREAKFAST 90 tablet 2   lisinopril (ZESTRIL) 10 MG tablet Take 10 mg by mouth daily.     lovastatin (MEVACOR) 20 MG tablet Take 20 mg by mouth at bedtime.     pantoprazole (PROTONIX) 40 MG tablet Take 1 tablet (40 mg total) by mouth daily. 30 tablet 1   prazosin (MINIPRESS) 2 MG capsule Take 1 capsule (2 mg total) by mouth at bedtime. 90 capsule 0   propranolol (INNOPRAN XL) 80 MG 24 hr capsule Take by mouth.     Semaglutide, 2 MG/DOSE, 8 MG/3ML SOPN Inject into the skin.     zaleplon (SONATA) 10 MG capsule TAKE ONE CAPSULE BY MOUTH EVERY NIGHT AT BEDTIME AS NEEDED  FOR SLEEP 30 capsule 1   vitamin B-12 (CYANOCOBALAMIN) 1000 MCG  tablet Take by mouth.  (Patient not taking: Reported on 02/06/2022)     No current facility-administered medications for this visit.     Musculoskeletal: Strength & Muscle Tone:  UTA Gait & Station:  Seated Patient leans: N/A  Psychiatric Specialty Exam: Review of Systems  Musculoskeletal:        Hip - left sided pain  Psychiatric/Behavioral:  Positive for decreased concentration, dysphoric mood and sleep disturbance. The patient is nervous/anxious.   All other systems reviewed and are negative.   There were no vitals taken for this visit.There is no height or weight on file to calculate BMI.  General Appearance: Casual  Eye Contact:  Fair  Speech:  Normal Rate  Volume:  Normal  Mood:  Anxious and Depressed  Affect:  Congruent  Thought Process:  Goal Directed and Descriptions of Associations: Intact  Orientation:  Full (Time, Place, and Person)  Thought Content: Logical   Suicidal Thoughts:  No  Homicidal Thoughts:  No  Memory:  Immediate;   Fair Recent;   Fair Remote;   Fair  Judgement:  Intact  Insight:  Fair  Psychomotor Activity:  Normal  Concentration:  Concentration: Fair and Attention Span: Fair  Recall:  [employer] of Knowledge: Fair  Language: Fair  Akathisia:  No  Handed:  Right  AIMS (if indicated): not done  Assets:  Communication Skills Desire for Improvement Housing Social Support  ADL's:  Intact  Cognition: WNL  Sleep:   improving   Screenings: AIMS    Flowsheet Row Video Visit from 12/08/2021 in [place] Total Score 0      GAD-7    Flowsheet Row Video Visit from 02/06/2022 in [place] Video Visit from 01/24/2022 in [place] Video Visit from 12/08/2021 in [place] Video Visit from 10/03/2021 in [place]  Total GAD-7 Score '7 10 4 12      '$ PHQ2-9    Flowsheet Row Video Visit from  02/06/2022 in [place] Video Visit from 01/24/2022 in [place] Video Visit from 12/08/2021 in [place] Video Visit from 11/01/2021 in [place] Video Visit from 10/03/2021 in [place]  PHQ-2 Total Score 0 0 '1 2 2  '$ PHQ-9 Total Score -- -- -- 4 9      Flowsheet Row Video Visit from 02/06/2022 in [place] Video Visit from 01/24/2022 in [place] ED from 01/13/2022 in [place] CATEGORY Low Risk Low Risk No Risk        Assessment and Plan: Kaylee Gordon is a 60 year old Caucasian female, married, lives in [place], has a history of bipolar disorder, GAD, fibromyalgia, IBS, history of QT prolongation on EKG was evaluated by telemedicine today.  Patient with continued anxiety although improving, however will need cardiology clearance prior to readjustment of Lexapro dosage.  Plan as noted below.  Plan Bipolar disorder in remission Lexapro 15 mg p.o. daily Lamotrigine 175 mg p.o. daily in divided dosage Continue CBT with Ms. Miguel Dibble  GAD-some improvement Lexapro 15 mg p.o. daily.  We will consider increasing the dosage of Lexapro however patient to get repeat EKG, cardiology consultation prior to that. I have also send a message to cardiologist Dr.Paraschos. Continue clonazepam 0.5 mg as needed for  severe anxiety-patient to limit use.  Insomnia-improving Continue sleep hygiene techniques Prazosin 2 mg p.o. nightly Sonata 10 mg p.o. nightly as needed  Bereavement-improving Continue CBT.  History of prolonged QT syndrome-patient will need repeat EKG prior to making medication changes.  I have communicated/sent a message to cardiology as noted above.  Patient to follow up with her providers for pain  management.  Follow-up in clinic in 6 to 8 weeks or sooner if needed.   Collaboration of Care: Collaboration of Care: Referral or follow-up with counselor/therapist AEB encouraged to continue to follow up with her therapist and Other encouraged to schedule an appointment with cardiology-I have also sent a message to cardiology regarding this patient.  Patient/Guardian was advised Release of Information must be obtained prior to any record release in order to collaborate their care with an outside provider. Patient/Guardian was advised if they have not already done so to contact the registration department to sign all necessary forms in order for [place] to release information regarding their care.   Consent: Patient/Guardian gives verbal consent for treatment and assignment of benefits for services provided during this visit. Patient/Guardian expressed understanding and agreed to proceed.   This note was generated in part or whole with voice recognition software. Voice recognition is usually quite accurate but there are transcription errors that can and very often do occur. I apologize for any typographical errors that were not detected and corrected.      Ursula Alert, MD 02/07/2022, 7:04 AM

## 2022-02-11 ENCOUNTER — Other Ambulatory Visit: Payer: Self-pay | Admitting: Psychiatry

## 2022-02-11 DIAGNOSIS — F411 Generalized anxiety disorder: Secondary | ICD-10-CM

## 2022-02-11 DIAGNOSIS — F3176 Bipolar disorder, in full remission, most recent episode depressed: Secondary | ICD-10-CM

## 2022-03-10 ENCOUNTER — Telehealth: Payer: Self-pay | Admitting: Psychiatry

## 2022-03-10 NOTE — Telephone Encounter (Signed)
Reviewed most recent EKG received from Dr. Isaias Cowman - Dated March 07, 2022.  Normal sinus rhythm QTc-499.3  Will discuss medication readjustment at next visit.

## 2022-03-13 ENCOUNTER — Telehealth: Payer: Self-pay

## 2022-03-13 DIAGNOSIS — G4701 Insomnia due to medical condition: Secondary | ICD-10-CM

## 2022-03-13 MED ORDER — ZALEPLON 10 MG PO CAPS
ORAL_CAPSULE | ORAL | 0 refills | Status: DC
Start: 2022-03-13 — End: 2022-03-28

## 2022-03-13 NOTE — Telephone Encounter (Signed)
  pt called left a message that she needs a refill on the zaleplon she is completely out. pt last seen on 02-06-22 next  appt 9-12   zaleplon (SONATA) 10 MG capsule Medication Date: 01/11/2022 Department: [facility] Ordering/Authorizing: Ursula Alert, MD   Order Providers  Prescribing Provider Encounter Provider  Ursula Alert, MD Ursula Alert, MD   Outpatient Medication Detail   Disp Refills Start End   zaleplon (SONATA) 10 MG capsule 30 capsule 1 01/11/2022    Sig: TAKE ONE CAPSULE BY MOUTH EVERY NIGHT AT BEDTIME AS NEEDED FOR SLEEP   Sent to pharmacy as: zaleplon (SONATA) 10 MG capsule   E-Prescribing Status: Receipt confirmed by pharmacy (01/11/2022  1:11 PM EDT)

## 2022-03-13 NOTE — Telephone Encounter (Signed)
I have sent Zaleplon to pharmacy.

## 2022-03-13 NOTE — Telephone Encounter (Signed)
left message that rx was sent to pharmacy.

## 2022-03-21 ENCOUNTER — Ambulatory Visit
Admission: EM | Admit: 2022-03-21 | Discharge: 2022-03-21 | Disposition: A | Discharge status: Home / Self Care | Payer: Medicare PPO | Attending: Emergency Medicine | Admitting: Emergency Medicine

## 2022-03-21 DIAGNOSIS — J0100 Acute maxillary sinusitis, unspecified: Secondary | ICD-10-CM

## 2022-03-21 MED ORDER — AMOXICILLIN 875 MG PO TABS: 875.0000 mg | ORAL_TABLET | Freq: Two times a day (BID) | ORAL | 0 refills | Status: AC

## 2022-03-21 NOTE — ED Provider Notes (Signed)
[name]    CSN: [id number] Arrival date & time: 03/21/22  1523      History   Chief Complaint Chief Complaint  Patient presents with   Nasal Congestion   Headache   Otalgia    HPI Kaylee Gordon is a 61 y.o. female.  She presents with 7-day history of sinus pressure, sinus headache, sinus congestion, sinus pain, postnasal drip, runny nose, mild cough.  Her symptoms are similar to previous sinus infections; last occurred a year ago.  Treatment at home with Coricidin HBP and Flonase nasal spray without relief.  She has had chills but no fever.  No ear pain, sore throat, chest pain, shortness of breath, vomiting, diarrhea, or other symptoms.  Her medical history includes hypertension, heart failure, prolonged QT syndrome, atrial fibrillation, CKD, asthma, allergic rhinitis, hypothyroidism, bipolar disorder, anxiety.  The history is provided by the patient and medical records.    Past Medical History:  Diagnosis Date   Anxiety    Asthma    B12 deficiency    Cancer ([place])    SKIN CANCER   CHF (congestive heart failure) ([place])    Depression    Hypertension    Thyroid disease     Patient Active Problem List   Diagnosis Date Noted   History of prolonged Q-T interval on ECG 01/24/2022   Trochanteric bursitis of left hip 01/20/2022   Bereavement 10/03/2021   Bipolar 1 disorder, depressed, moderate ([place]) 10/03/2021   Elevated blood pressure reading 06/06/2021   Prolonged Q-T interval on ECG 06/02/2021   Prolonged QT syndrome 06/01/2021   Bipolar disorder, in full remission, most recent episode depressed ([place]) 05/24/2021   Bipolar 1 disorder, depressed, partial remission ([place]) 02/10/2021   History of colonic polyps 08/16/2020   CKD (chronic kidney disease) stage 3, GFR 30-59 ml/min ([place]) 08/16/2020   Insomnia due to medical condition 06/25/2020   At risk for prolonged QT interval syndrome 06/25/2020   Skull lesion 02/24/2020   Tremor 05/08/2019   Bipolar I  disorder, most recent episode depressed ([place]) 12/30/2018   B12 deficiency 11/22/2018   Low iron 11/22/2018   History of normocytic normochromic anemia 11/21/2018   Iron deficiency anemia secondary to inadequate dietary iron intake 11/21/2018   Multiple lung nodules on CT 04/19/2018   Borderline diabetes mellitus 08/10/2017   Paroxysmal supraventricular tachycardia ([place]) 09/27/2015   Benign essential HTN 06/22/2015   Combined fat and carbohydrate induced hyperlipemia 06/22/2015   Breathlessness on exertion 06/17/2015   Pure hypercholesterolemia 04/22/2015   Congestive heart failure with left ventricular systolic dysfunction ([place]) 03/24/2015   Acquired hypothyroidism 02/04/2015   Heart failure, systolic ([place]) 97/98/9211   Anxiety 12/28/2014   Allergic rhinitis 10/27/2014   Bipolar 1 disorder, depressed ([place]) 10/27/2014   Hypercholesterolemia without hypertriglyceridemia 10/27/2014   Depression, major, recurrent, moderate ([place]) 10/27/2014   Adult hypothyroidism 10/27/2014   GAD (generalized anxiety disorder) 10/27/2014   Essential (primary) hypertension 10/27/2014   Colon polyp 10/27/2014   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    Past Surgical History:  Procedure Laterality Date   ANKLE SURGERY Right 2012   APPENDECTOMY     AUGMENTATION MAMMAPLASTY Bilateral    BREAST ENHANCEMENT SURGERY     COLONOSCOPY WITH PROPOFOL N/A 02/22/2015   Procedure: COLONOSCOPY WITH PROPOFOL;  Surgeon: Josefine Class, MD;  Location: [facility] ENDOSCOPY;  Service: Endoscopy;  Laterality: N/A;   ESOPHAGOGASTRODUODENOSCOPY (EGD) WITH PROPOFOL N/A 02/22/2015   Procedure: ESOPHAGOGASTRODUODENOSCOPY (EGD) WITH PROPOFOL;  Surgeon: Josefine Class, MD;  Location: [facility] ENDOSCOPY;  Service: Endoscopy;  Laterality: N/A;   ESOPHAGOGASTRODUODENOSCOPY (EGD) WITH PROPOFOL N/A 02/11/2016   Procedure: ESOPHAGOGASTRODUODENOSCOPY (EGD) WITH PROPOFOL;  Surgeon: Manya Silvas, MD;  Location: [facility] ENDOSCOPY;  Service:  Endoscopy;  Laterality: N/A;   HEMORRHOID SURGERY     NASAL SINUS SURGERY     SAVORY DILATION N/A 02/22/2015   Procedure: SAVORY DILATION;  Surgeon: Josefine Class, MD;  Location: [facility] ENDOSCOPY;  Service: Endoscopy;  Laterality: N/A;   SAVORY DILATION N/A 02/11/2016   Procedure: SAVORY DILATION;  Surgeon: Manya Silvas, MD;  Location: [facility] ENDOSCOPY;  Service: Endoscopy;  Laterality: N/A;   SKIN CANCER EXCISION      OB History   No obstetric history on file.      Home Medications    Prior to Admission medications   Medication Sig Start Date End Date Taking? Authorizing Provider  amoxicillin (AMOXIL) 875 MG tablet Take 1 tablet (875 mg total) by mouth 2 (two) times daily for 10 days. 03/21/22 03/31/22 Yes Sharion Balloon, NP  azelastine (ASTELIN) 0.1 % nasal spray Place into the nose. 10/11/18 02/06/22  [provider]  calcitRIOL (ROCALTROL) 0.25 MCG capsule TAKE ONE CAPSULE BY MOUTH DAILY 11/11/21   Elayne Snare, MD  clonazePAM (KLONOPIN) 0.5 MG tablet Take 0.5 tablets (0.25 mg total) by mouth daily as needed for anxiety. 02/12/22 04/13/22  Ursula Alert, MD  cyclobenzaprine (FLEXERIL) 5 MG tablet Take 5 mg by mouth 3 (three) times daily as needed. 01/20/22   [provider]  escitalopram (LEXAPRO) 10 MG tablet Take 1.5 tablets (15 mg total) by mouth daily. 12/08/21   Ursula Alert, MD  fenofibrate 160 MG tablet Take 1 tablet by mouth daily. 12/17/20   [provider]  fluticasone Asencion Islam) 50 MCG/ACT nasal spray  12/25/12   [provider]  gabapentin (NEURONTIN) 300 MG capsule Take 300 mg by mouth. 2 CAP AM, 1 CAP Q noon, 2 CAP pm, 10/06/19   [provider]  lamoTRIgine (LAMICTAL) 150 MG tablet TAKE 1/2 TABLET BY MOUTH TWICE A DAY 02/12/22   Norman Clay, MD  lamoTRIgine (LAMICTAL) 25 MG tablet Take 1 tablet (25 mg total) by mouth daily. Take along with 150 mg daily 01/24/22   Ursula Alert, MD  levothyroxine (SYNTHROID) 50 MCG tablet Take 1  tablet Monday, Wednesday, Friday. Take 1 1/2 all other days 12/07/21   Elayne Snare, MD  levothyroxine (SYNTHROID) 75 MCG tablet TAKE ONE TABLET BY MOUTH EVERY MORNING BEFORE BREAKFAST 06/20/21   Elayne Snare, MD  lisinopril (ZESTRIL) 10 MG tablet Take 10 mg by mouth daily. 05/26/19   [provider]  lovastatin (MEVACOR) 20 MG tablet Take 20 mg by mouth at bedtime.    [provider]  pantoprazole (PROTONIX) 40 MG tablet Take 1 tablet (40 mg total) by mouth daily. 12/27/18   Tyler Pita, MD  prazosin (MINIPRESS) 2 MG capsule Take 1 capsule (2 mg total) by mouth at bedtime. 01/24/22   Ursula Alert, MD  propranolol (INNOPRAN XL) 80 MG 24 hr capsule Take by mouth. 07/23/19   [provider]  Semaglutide, 2 MG/DOSE, 8 MG/3ML SOPN Inject into the skin. 01/19/22   [provider]  vitamin B-12 (CYANOCOBALAMIN) 1000 MCG tablet Take by mouth.  Patient not taking: Reported on 02/06/2022    [provider]  zaleplon (SONATA) 10 MG capsule TAKE ONE CAPSULE BY MOUTH EVERY NIGHT AT BEDTIME AS NEEDED FOR SLEEP 03/13/22   Ursula Alert, MD  Family History Family History  Problem Relation Age of Onset   Cancer Mother        colon cancer   Depression Mother    Anxiety disorder Mother    Heart disease Father    Cancer Father        ? neck cancer- still living.    Anxiety disorder Father    Depression Father    Hypoparathyroidism Son    Colon cancer Maternal Aunt        died in 59s.     Social History Social History   Tobacco Use   Smoking status: Never   Smokeless tobacco: Never  Substance Use Topics   Alcohol use: No    Alcohol/week: 0.0 standard drinks of alcohol    Comment: MAYBE ONCE OR TWICE A MONTH   Drug use: No     Allergies   Levofloxacin, Atorvastatin, Hydrochlorothiazide, Parafon forte dsc [chlorzoxazone], Pollen extract, and Sudafed [pseudoephedrine hcl]   Review of Systems Review of Systems  Constitutional:  Positive for  chills. Negative for fever.  HENT:  Positive for congestion, postnasal drip, rhinorrhea, sinus pressure and sinus pain. Negative for ear pain and sore throat.   Respiratory:  Positive for cough. Negative for shortness of breath.   Cardiovascular:  Negative for chest pain and palpitations.  Gastrointestinal:  Negative for diarrhea and vomiting.  Skin:  Negative for color change and rash.  All other systems reviewed and are negative.    Physical Exam Triage Vital Signs ED Triage Vitals  Enc Vitals Group     BP      Pulse      Resp      Temp      Temp src      SpO2      Weight      Height      Head Circumference      Peak Flow      Pain Score      Pain Loc      Pain Edu?      Excl. in [place]?    No data found.  Updated Vital Signs BP 116/78   Pulse 95   Temp 98.6 F (37 C)   Resp 18   Ht 5' 4.5" (1.638 m)   Wt 148 lb (67.1 kg)   SpO2 96%   BMI 25.01 kg/m   Visual Acuity Right Eye Distance:   Left Eye Distance:   Bilateral Distance:    Right Eye Near:   Left Eye Near:    Bilateral Near:     Physical Exam Vitals and nursing note reviewed.  Constitutional:      General: She is not in acute distress.    Appearance: Normal appearance. She is well-developed. She is not ill-appearing.  HENT:     Right Ear: Tympanic membrane normal.     Left Ear: Tympanic membrane normal.     Nose: Congestion and rhinorrhea present.     Mouth/Throat:     Mouth: Mucous membranes are moist.     Pharynx: Oropharynx is clear.  Cardiovascular:     Rate and Rhythm: Normal rate and regular rhythm.     Heart sounds: Normal heart sounds.  Pulmonary:     Effort: Pulmonary effort is normal. No respiratory distress.     Breath sounds: Normal breath sounds.  Musculoskeletal:     Cervical back: Neck supple.  Skin:    General: Skin is warm and dry.  Neurological:  Mental Status: She is alert.  Psychiatric:        Mood and Affect: Mood normal.        Behavior: Behavior normal.       UC Treatments / Results  Labs (all labs ordered are listed, but only abnormal results are displayed) Labs Reviewed - No data to display  EKG   Radiology No results found.  Procedures Procedures (including critical care time)  Medications Ordered in UC Medications - No data to display  Initial Impression / Assessment and Plan / UC Course  I have reviewed the triage vital signs and the nursing notes.  Pertinent labs & imaging results that were available during my care of the patient were reviewed by me and considered in my medical decision making (see chart for details).   Acute sinusitis.  Patient has been symptomatic for 7 days and is not improving with OTC treatment.  Treating with amoxicillin.  Discussed continued symptomatic treatment with Flonase nasal spray and Coricidin.  Also suggested nasal saline spray.  Instructed patient to follow up with her PCP if her symptoms are not improving.  She agrees to plan of care.     Final Clinical Impressions(s) / UC Diagnoses   Final diagnoses:  Acute non-recurrent maxillary sinusitis     Discharge Instructions      Take the amoxicillin as directed for sinus infection.  Follow up with your primary care provider if your symptoms are not improving.        ED Prescriptions     Medication Sig Dispense Auth. Provider   amoxicillin (AMOXIL) 875 MG tablet Take 1 tablet (875 mg total) by mouth 2 (two) times daily for 10 days. 20 tablet Sharion Balloon, NP      PDMP not reviewed this encounter.   Sharion Balloon, NP 03/21/22 1622

## 2022-03-21 NOTE — Discharge Instructions (Addendum)
Take the amoxicillin as directed for sinus infection.  Follow up with your primary care provider if your symptoms are not improving.

## 2022-03-21 NOTE — ED Triage Notes (Signed)
Patient to Urgent Care with complaints of headache, bilateral ear "popping", and sinus congestion and pain. Reports symptoms started on Friday.   Denies any known fevers.

## 2022-03-27 DIAGNOSIS — Z0000 Encounter for general adult medical examination without abnormal findings: Secondary | ICD-10-CM | POA: Insufficient documentation

## 2022-03-28 ENCOUNTER — Encounter: Payer: Self-pay | Admitting: Psychiatry

## 2022-03-28 ENCOUNTER — Telehealth (INDEPENDENT_AMBULATORY_CARE_PROVIDER_SITE_OTHER): Payer: Medicare PPO | Admitting: Psychiatry

## 2022-03-28 DIAGNOSIS — F3176 Bipolar disorder, in full remission, most recent episode depressed: Secondary | ICD-10-CM

## 2022-03-28 DIAGNOSIS — G4701 Insomnia due to medical condition: Secondary | ICD-10-CM

## 2022-03-28 DIAGNOSIS — F411 Generalized anxiety disorder: Secondary | ICD-10-CM | POA: Diagnosis not present

## 2022-03-28 DIAGNOSIS — Z87898 Personal history of other specified conditions: Secondary | ICD-10-CM

## 2022-03-28 DIAGNOSIS — Z634 Disappearance and death of family member: Secondary | ICD-10-CM | POA: Diagnosis not present

## 2022-03-28 MED ORDER — CLONAZEPAM 0.5 MG PO TABS: 0.2500 mg | ORAL_TABLET | Freq: Every day | ORAL | 2 refills | Status: DC | PRN

## 2022-03-28 MED ORDER — ZALEPLON 10 MG PO CAPS: ORAL_CAPSULE | ORAL | 2 refills | Status: DC

## 2022-03-28 MED ORDER — ESCITALOPRAM OXALATE 10 MG PO TABS: 15.0000 mg | ORAL_TABLET | Freq: Every day | ORAL | 0 refills | Status: DC

## 2022-03-28 NOTE — Progress Notes (Unsigned)
Virtual Visit via Video Note  I connected with Kaylee Gordon on 03/28/22 at 11:20 AM EDT by a video enabled telemedicine application and verified that I am speaking with the correct person using two identifiers.  Location Provider Location : [facility] Patient Location : Home  Participants: Patient , Provider    I discussed the limitations of evaluation and management by telemedicine and the availability of in person appointments. The patient expressed understanding and agreed to proceed.    I discussed the assessment and treatment plan with the patient. The patient was provided an opportunity to ask questions and all were answered. The patient agreed with the plan and demonstrated an understanding of the instructions.   The patient was advised to call back or seek an in-person evaluation if the symptoms worsen or if the condition fails to improve as anticipated.   Kaylee Village MD OP Progress Note  03/28/2022 5:33 PM Kaylee Gordon  MRN:  [id number]  Chief Complaint:  Chief Complaint  Patient presents with   Follow-up: 61 year old Caucasian female with history of bipolar disorder, anxiety disorder, fibromyalgia, history of QT prolongation, presented for medication management.   HPI: Kaylee Gordon is a 61 year old Caucasian female who has a history of bipolar disorder, GAD, fibromyalgia, married, lives in [place], was evaluated by telemedicine today.  Patient today reports she is currently doing fairly well with regards to her depression.  Continues to have anxiety mostly triggered by situational stressors.  Patient reports she is currently worried about the fact that her daughter has been unable to find a job.  Patient has been following up with a therapist, reports therapy sessions as beneficial.  Continues to be compliant on Lexapro.  Denies side effects.  Does have clonazepam available reports she has been limiting use.  Patient denies any suicidality or homicidality.  Denies any  perceptual disturbances.  Reports sleep is good on the [place] as needed.  She does have pain which does have an impact on her sleep on and off.  Patient continues to follow-up with cardiology, does have a history of QT prolongation.  Recently coordinated care with her cardiologist and was advised to avoid any medications with QT prolongation effect and not to increase the dosage of her current medication which could potentially cause QT prolongation.  This was discussed with patient.  The patient currently denies any other concerns today.  Visit Diagnosis:    ICD-10-CM   1. Bipolar disorder, in full remission, most recent episode depressed ([place])  F31.76     2. GAD (generalized anxiety disorder)  F41.1 clonazePAM (KLONOPIN) 0.5 MG tablet    escitalopram (LEXAPRO) 10 MG tablet    3. Insomnia due to medical condition  G47.01 zaleplon (SONATA) 10 MG capsule   anxiety    4. Bereavement  Z63.4     5. History of prolonged Q-T interval on ECG  Z87.898       Past Psychiatric History: Reviewed past psychiatric history from progress note on 09/11/2018.  Past Medical History:  Past Medical History:  Diagnosis Date   Anxiety    Asthma    B12 deficiency    Cancer ([place])    SKIN CANCER   CHF (congestive heart failure) ([place])    Depression    Hypertension    Thyroid disease     Past Surgical History:  Procedure Laterality Date   ANKLE SURGERY Right 2012   APPENDECTOMY     AUGMENTATION MAMMAPLASTY Bilateral    BREAST ENHANCEMENT SURGERY  COLONOSCOPY WITH PROPOFOL N/A 02/22/2015   Procedure: COLONOSCOPY WITH PROPOFOL;  Surgeon: Josefine Class, MD;  Location: [facility] ENDOSCOPY;  Service: Endoscopy;  Laterality: N/A;   ESOPHAGOGASTRODUODENOSCOPY (EGD) WITH PROPOFOL N/A 02/22/2015   Procedure: ESOPHAGOGASTRODUODENOSCOPY (EGD) WITH PROPOFOL;  Surgeon: Josefine Class, MD;  Location: [facility] ENDOSCOPY;  Service: Endoscopy;  Laterality: N/A;   ESOPHAGOGASTRODUODENOSCOPY (EGD) WITH PROPOFOL  N/A 02/11/2016   Procedure: ESOPHAGOGASTRODUODENOSCOPY (EGD) WITH PROPOFOL;  Surgeon: Manya Silvas, MD;  Location: [facility] ENDOSCOPY;  Service: Endoscopy;  Laterality: N/A;   HEMORRHOID SURGERY     NASAL SINUS SURGERY     SAVORY DILATION N/A 02/22/2015   Procedure: SAVORY DILATION;  Surgeon: Josefine Class, MD;  Location: [facility] ENDOSCOPY;  Service: Endoscopy;  Laterality: N/A;   SAVORY DILATION N/A 02/11/2016   Procedure: SAVORY DILATION;  Surgeon: Manya Silvas, MD;  Location: [facility] ENDOSCOPY;  Service: Endoscopy;  Laterality: N/A;   SKIN CANCER EXCISION      Family Psychiatric History: Reviewed family psychiatric history from progress note on 09/11/2018.  Family History:  Family History  Problem Relation Age of Onset   Cancer Mother        colon cancer   Depression Mother    Anxiety disorder Mother    Heart disease Father    Cancer Father        ? neck cancer- still living.    Anxiety disorder Father    Depression Father    Hypoparathyroidism Son    Colon cancer Maternal Aunt        died in 69s.     Social History: Reviewed social history from progress note on 09/11/2018. Social History   Socioeconomic History   Marital status: Married    Spouse name: Legrand Como   Number of children: Not on file   Years of education: Not on file   Highest education level: Not on file  Occupational History   Not on file  Tobacco Use   Smoking status: Never   Smokeless tobacco: Never  Vaping Use   Vaping Use: Not on file  Substance and Sexual Activity   Alcohol use: No    Alcohol/week: 0.0 standard drinks of alcohol    Comment: MAYBE ONCE OR TWICE A MONTH   Drug use: No   Sexual activity: Not Currently    Partners: Male  Other Topics Concern   Not on file  Social History Narrative   Lives in [place]; taught high school- math; retd; no smoking; social alcohol.    Social Determinants of Health   Financial Resource Strain: Low Risk  (05/16/2018)   Overall Financial Resource  Strain (CARDIA)    Difficulty of Paying Living Expenses: Not hard at all  Food Insecurity: No Food Insecurity (05/16/2018)   Hunger Vital Sign    Worried About Running Out of Food in the Last Year: Never true    Ran Out of Food in the Last Year: Never true  Transportation Needs: No Transportation Needs (05/16/2018)   PRAPARE - Hydrologist (Medical): No    Lack of Transportation (Non-Medical): No  Physical Activity: Unknown (05/16/2018)   Exercise Vital Sign    Days of Exercise per Week: 0 days    Minutes of Exercise per Session: Not on file  Stress: Stress Concern Present (05/16/2018)   [place]    Feeling of Stress : Very much  Social Connections: Unknown (05/16/2018)   Social Connection and Isolation  Panel [NHANES]    Frequency of Communication with Friends and Family: Twice a week    Frequency of Social Gatherings with Friends and Family: Twice a week    Attends Religious Services: More than 4 times per year    Active Member of [employer] or Organizations: Not on file    Attends Archivist Meetings: Not on file    Marital Status: Married    Allergies:  Allergies  Allergen Reactions   Levofloxacin Other (See Comments) and Nausea Only    Lethargic, Flu-like symptoms, Hot flashes    Atorvastatin    Hydrochlorothiazide Other (See Comments)    Intolerance - dry lips   Parafon Forte Dsc [Chlorzoxazone]    Pollen Extract    Pseudoephedrine Other (See Comments)   Sudafed [Pseudoephedrine Hcl]     Heart racing    Metabolic Disorder Labs: Lab Results  Component Value Date   HGBA1C 5.9 (H) 02/16/2021   Lab Results  Component Value Date   PROLACTIN 18.3 07/22/2014   Lab Results  Component Value Date   CHOL 184 02/16/2021   TRIG 179 (H) 02/16/2021   HDL 37 (L) 02/16/2021   CHOLHDL 5.0 (H) 02/16/2021   LDLCALC 115 (H) 02/16/2021   Lab Results  Component Value Date    TSH 3.30 12/05/2021   TSH 2.510 02/16/2021    Therapeutic Level Labs: No results found for: "LITHIUM" Lab Results  Component Value Date   VALPROATE 39 (L) 02/04/2019   No results found for: "CBMZ"  Current Medications: Current Outpatient Medications  Medication Sig Dispense Refill   amoxicillin (AMOXIL) 875 MG tablet Take 1 tablet (875 mg total) by mouth 2 (two) times daily for 10 days. 20 tablet 0   azelastine (ASTELIN) 0.1 % nasal spray Place into the nose.     calcitRIOL (ROCALTROL) 0.25 MCG capsule TAKE ONE CAPSULE BY MOUTH DAILY 90 capsule 1   cyclobenzaprine (FLEXERIL) 5 MG tablet Take 5 mg by mouth 3 (three) times daily as needed.     fenofibrate 160 MG tablet Take 1 tablet by mouth daily.     fluticasone (FLONASE) 50 MCG/ACT nasal spray      gabapentin (NEURONTIN) 300 MG capsule Take 300 mg by mouth. 2 CAP AM, 1 CAP Q noon, 2 CAP pm,     lamoTRIgine (LAMICTAL) 150 MG tablet TAKE 1/2 TABLET BY MOUTH TWICE A DAY 90 tablet 0   lamoTRIgine (LAMICTAL) 25 MG tablet Take 1 tablet (25 mg total) by mouth daily. Take along with 150 mg daily 90 tablet 0   levothyroxine (SYNTHROID) 50 MCG tablet Take 1 tablet Monday, Wednesday, Friday. Take 1 1/2 all other days 105 tablet 3   lisinopril (ZESTRIL) 10 MG tablet Take 10 mg by mouth daily.     lovastatin (MEVACOR) 20 MG tablet Take 20 mg by mouth at bedtime.     pantoprazole (PROTONIX) 40 MG tablet Take 1 tablet (40 mg total) by mouth daily. 30 tablet 1   prazosin (MINIPRESS) 2 MG capsule Take 1 capsule (2 mg total) by mouth at bedtime. 90 capsule 0   promethazine (PHENERGAN) 25 MG tablet Take 25 mg by mouth every 6 (six) hours as needed for nausea or vomiting.     propranolol (INNOPRAN XL) 80 MG 24 hr capsule Take by mouth.     Semaglutide, 2 MG/DOSE, 8 MG/3ML SOPN Inject into the skin.     [START ON 04/06/2022] clonazePAM (KLONOPIN) 0.5 MG tablet Take 0.5 tablets (0.25 mg total) by  mouth daily as needed for anxiety. 15 tablet 2    escitalopram (LEXAPRO) 10 MG tablet Take 1.5 tablets (15 mg total) by mouth daily. 135 tablet 0   zaleplon (SONATA) 10 MG capsule TAKE ONE CAPSULE BY MOUTH EVERY NIGHT AT BEDTIME AS NEEDED FOR SLEEP 30 capsule 2   No current facility-administered medications for this visit.     Musculoskeletal: Strength & Muscle Tone:  UTA Gait & Station:  Seated Patient leans: N/A  Psychiatric Specialty Exam: Review of Systems  Psychiatric/Behavioral:  The patient is nervous/anxious.   All other systems reviewed and are negative.   There were no vitals taken for this visit.There is no height or weight on file to calculate BMI.  General Appearance: Casual  Eye Contact:  Fair  Speech:  Clear and Coherent  Volume:  Normal  Mood:  Anxious  Affect:  Congruent  Thought Process:  Goal Directed and Descriptions of Associations: Intact  Orientation:  Full (Time, Place, and Person)  Thought Content: Logical   Suicidal Thoughts:  No  Homicidal Thoughts:  No  Memory:  Immediate;   Fair Recent;   Fair Remote;   Fair  Judgement:  Fair  Insight:  Fair  Psychomotor Activity:  Normal  Concentration:  Concentration: Fair and Attention Span: Fair  Recall:  [employer] of Knowledge: Fair  Language: Fair  Akathisia:  No  Handed:  Right  AIMS (if indicated): not done  Assets:  Communication Skills Desire for Improvement Housing Social Support Talents/Skills  ADL's:  Intact  Cognition: WNL  Sleep:  Fair   Screenings: AIMS    Flowsheet Row Video Visit from 12/08/2021 in [place] Total Score 0      GAD-7    Flowsheet Row Video Visit from 03/28/2022 in [facility] Video Visit from 02/06/2022 in [facility] Video Visit from 01/24/2022 in [place] Video Visit from 12/08/2021 in [place] Video Visit from 10/03/2021 in [place]  Total GAD-7 Score '10 7 10 4 12      '$ PHQ2-9    Flowsheet Row Video Visit from 03/28/2022 in [place] [facility] Video Visit from 02/06/2022 in [place] Video Visit from 01/24/2022 in [place] Video Visit from 12/08/2021 in [place] Video Visit from 11/01/2021 in [place]  PHQ-2 Total Score 0 0 0 1 2  PHQ-9 Total Score -- -- -- -- 4      Flowsheet Row Video Visit from 03/28/2022 in [facility] from 03/21/2022 in [facility]  Video Visit from 02/06/2022 in [place] Low Risk No Risk Low Risk        Assessment and Plan: RHEALYN CULLEN is a 61 year old Caucasian female, married, lives in [place], has a history of bipolar disorder, GAD, fibromyalgia, IBS, history of QT prolongation on EKG was evaluated by telemedicine today.  Patient with continued anxiety, will benefit from continued psychotherapy sessions, discussed plan as noted below.  Plan Bipolar disorder in remission Lexapro 15 mg p.o. daily Lamotrigine 175 mg p.o. daily in divided dosage Continue CBT with Ms. Miguel Dibble.  GAD-unstable Increase clonazepam 0.25 daily as needed for severe anxiety attacks.  Patient advised to limit use.  Reviewed [place] PMP aware.  Advised about long-term risk of being on benzodiazepine therapy Lexapro 15 mg p.o. daily.  I have coordinated care with cardiology and per cardiology provider-Ms. Clabe Seal -should avoid increasing the dosage of QT prolonging medications like Lexapro and should stay away from adding those kind of medications due to her recent EKG showing QT prolongation. This was discussed with patient.  Insomnia-improving Continue prazosin 2 mg p.o. nightly Sonata 10 mg p.o. nightly as  needed  Bereavement-improving Continue CBT with Ms. Miguel Dibble.  History of prolonged QT syndrome-we will continue to coordinate with cardiology as noted above.  Follow-up in clinic in 2 to 3 months or sooner if needed.   Collaboration of Care: Collaboration of Care: Referral or follow-up with counselor/therapist AEB continue follow-up with therapist as well as cardiology.  Patient/Guardian was advised Release of Information must be obtained prior to any record release in order to collaborate their care with an outside provider. Patient/Guardian was advised if they have not already done so to contact the registration department to sign all necessary forms in order for [place] to release information regarding their care.   Consent: Patient/Guardian gives verbal consent for treatment and assignment of benefits for services provided during this visit. Patient/Guardian expressed understanding and agreed to proceed.   This note was generated in part or whole with voice recognition software. Voice recognition is usually quite accurate but there are transcription errors that can and very often do occur. I apologize for any typographical errors that were not detected and corrected.      Ursula Alert, MD 03/29/2022, 11:07 AM

## 2022-04-23 ENCOUNTER — Other Ambulatory Visit: Payer: Self-pay | Admitting: Psychiatry

## 2022-04-23 DIAGNOSIS — F411 Generalized anxiety disorder: Secondary | ICD-10-CM

## 2022-05-06 ENCOUNTER — Other Ambulatory Visit: Payer: Self-pay | Admitting: Endocrinology

## 2022-05-09 ENCOUNTER — Other Ambulatory Visit: Payer: Self-pay | Admitting: Psychiatry

## 2022-05-09 DIAGNOSIS — G4701 Insomnia due to medical condition: Secondary | ICD-10-CM

## 2022-06-12 ENCOUNTER — Ambulatory Visit: Payer: Medicare PPO | Admitting: Endocrinology

## 2022-06-12 NOTE — Progress Notes (Deleted)
Patient ID: Kaylee Gordon, female   DOB: September 19, 1960, 61 y.o.   MRN: [id number]    History of Present Illness:  Chief complaint: Endocrinology follow-up   Secondary hypothyroidism:  She was initially given thyroxine supplement with a borderline TSH levels in 2011 However because of complaints of  fatigue on her visit in 10/15 she had thyroid levels done and free T4 was significantly low She was empirically started on 25 g  of levothyroxine Initially she felt less tired with this; however her free T4 continue to be low and her dose had been increased progressively  She is taking 75 mcg levothyroxine when her free T4 was relatively low in 6/21 However because of high normal free T4 in 8/22 she is now taking 50 mcg TSH usually is normal  She has longstanding fatigue which is somewhat better but may be worse when she has more fibromyalgia No hair loss or cold intolerance She has lost weight from various other issues and Ozempic  She is taking her levothyroxine daily in the morning before breakfast  She takes her calcium in the evening after dinner TSH and T4 pending  Wt Readings from Last 3 Encounters:  03/21/22 148 lb (67.1 kg)  01/12/22 156 lb (70.8 kg)  12/05/21 162 lb 9.6 oz (73.8 kg)    Lab Results  Component Value Date   FREET4 0.68 12/05/2021   FREET4 0.93 06/06/2021   FREET4 1.53 02/16/2021   TSH 3.30 12/05/2021   TSH 2.510 02/16/2021   TSH 1.01 08/05/2020    Hypoparathyroidism    Has had hypocalcemia since she was 61 years old and presented with symptoms of cramping in her hands along with twitching of her face and eyes. She was [facility] at [facility] as having primary idiopathic hypoparathyroidism.   No symptoms of twitching/muscle cramping in her hands or feet, has no tingling or numbness in her face  Occasionally has numbness in her hands  She is taking calcitriol 0.25 mcg daily in the morning and her calcium once a day in the  evenings She takes her supplements very regularly  Last calcium normal as below  Lab Results  Component Value Date   CALCIUM 9.1 01/12/2022   CALCIUM 9.1 12/05/2021   CALCIUM 9.1 06/06/2021   CALCIUM 9.6 02/16/2021   CALCIUM 9.1 04/05/2020   Lab Results  Component Value Date   K 3.5 01/12/2022      Allergies as of 06/12/2022       Reactions   Levofloxacin Other (See Comments), Nausea Only   Lethargic, Flu-like symptoms, Hot flashes    Atorvastatin    Hydrochlorothiazide Other (See Comments)   Intolerance - dry lips   Parafon Forte Dsc [chlorzoxazone]    Pollen Extract    Pseudoephedrine Other (See Comments)   Sudafed [pseudoephedrine Hcl]    Heart racing        Medication List        Accurate as of June 12, 2022  9:18 AM. If you have any questions, ask your nurse or doctor.          azelastine 0.1 % nasal spray Commonly known as: ASTELIN Place into the nose.   calcitRIOL 0.25 MCG capsule Commonly known as: ROCALTROL TAKE ONE CAPSULE BY MOUTH DAILY   clonazePAM 0.5 MG tablet Commonly known as: KlonoPIN Take 0.5 tablets (0.25 mg total) by mouth daily as needed for anxiety.   cyclobenzaprine 5 MG tablet Commonly known  as: FLEXERIL Take 5 mg by mouth 3 (three) times daily as needed.   escitalopram 10 MG tablet Commonly known as: Lexapro Take 1.5 tablets (15 mg total) by mouth daily.   fenofibrate 160 MG tablet Take 1 tablet by mouth daily.   fluticasone 50 MCG/ACT nasal spray Commonly known as: FLONASE   gabapentin 300 MG capsule Commonly known as: NEURONTIN Take 300 mg by mouth. 2 CAP AM, 1 CAP Q noon, 2 CAP pm,   lamoTRIgine 150 MG tablet Commonly known as: LAMICTAL TAKE 1/2 TABLET BY MOUTH TWICE A DAY   lamoTRIgine 25 MG tablet Commonly known as: LAMICTAL TAKE 1 TABLET BY MOUTH DAILY WITH 150 MG TABLET   levothyroxine 50 MCG tablet Commonly known as: SYNTHROID Take 1 tablet Monday, Wednesday, Friday. Take 1 1/2 all other days    lisinopril 10 MG tablet Commonly known as: ZESTRIL Take 10 mg by mouth daily.   lovastatin 20 MG tablet Commonly known as: MEVACOR Take 20 mg by mouth at bedtime.   pantoprazole 40 MG tablet Commonly known as: PROTONIX Take 1 tablet (40 mg total) by mouth daily.   prazosin 2 MG capsule Commonly known as: MINIPRESS TAKE 1 CAPSULE BY MOUTH AT BEDTIME   promethazine 25 MG tablet Commonly known as: PHENERGAN Take 25 mg by mouth every 6 (six) hours as needed for nausea or vomiting.   propranolol 80 MG 24 hr capsule Commonly known as: INNOPRAN XL Take by mouth.   Semaglutide (2 MG/DOSE) 8 MG/3ML Sopn Inject into the skin.   zaleplon 10 MG capsule Commonly known as: SONATA TAKE ONE CAPSULE BY MOUTH EVERY NIGHT AT BEDTIME AS NEEDED FOR SLEEP        Allergies:  Allergies  Allergen Reactions   Levofloxacin Other (See Comments) and Nausea Only    Lethargic, Flu-like symptoms, Hot flashes    Atorvastatin    Hydrochlorothiazide Other (See Comments)    Intolerance - dry lips   Parafon Forte Dsc [Chlorzoxazone]    Pollen Extract    Pseudoephedrine Other (See Comments)   Sudafed [Pseudoephedrine Hcl]     Heart racing    Past Medical History:  Diagnosis Date   Anxiety    Asthma    B12 deficiency    Cancer ([place])    SKIN CANCER   CHF (congestive heart failure) ([place])    Depression    Hypertension    Thyroid disease     Past Surgical History:  Procedure Laterality Date   ANKLE SURGERY Right 2012   APPENDECTOMY     AUGMENTATION MAMMAPLASTY Bilateral    BREAST ENHANCEMENT SURGERY     COLONOSCOPY WITH PROPOFOL N/A 02/22/2015   Procedure: COLONOSCOPY WITH PROPOFOL;  Surgeon: Josefine Class, MD;  Location: [facility] ENDOSCOPY;  Service: Endoscopy;  Laterality: N/A;   ESOPHAGOGASTRODUODENOSCOPY (EGD) WITH PROPOFOL N/A 02/22/2015   Procedure: ESOPHAGOGASTRODUODENOSCOPY (EGD) WITH PROPOFOL;  Surgeon: Josefine Class, MD;  Location: [facility] ENDOSCOPY;  Service: Endoscopy;   Laterality: N/A;   ESOPHAGOGASTRODUODENOSCOPY (EGD) WITH PROPOFOL N/A 02/11/2016   Procedure: ESOPHAGOGASTRODUODENOSCOPY (EGD) WITH PROPOFOL;  Surgeon: Manya Silvas, MD;  Location[facility] ENDOSCOPY;  Service: Endoscopy;  Laterality: N/A;   HEMORRHOID SURGERY     NASAL SINUS SURGERY     SAVORY DILATION N/A 02/22/2015   Procedure: SAVORY DILATION;  Surgeon: Josefine Class, MD;  Location: [facility] ENDOSCOPY;  Service: Endoscopy;  Laterality: N/A;   SAVORY DILATION N/A 02/11/2016   Procedure: SAVORY DILATION;  Surgeon: Manya Silvas, MD;  Location:  [facility] ENDOSCOPY;  Service: Endoscopy;  Laterality: N/A;   SKIN CANCER EXCISION      Family History  Problem Relation Age of Onset   Cancer Mother        colon cancer   Depression Mother    Anxiety disorder Mother    Heart disease Father    Cancer Father        ? neck cancer- still living.    Anxiety disorder Father    Depression Father    Hypoparathyroidism Son    Colon cancer Maternal Aunt        died in 3s.     Social History:  reports that she has never smoked. She has never used smokeless tobacco. She reports that she does not drink alcohol and does not use drugs.  Review of Systems    History of depression, is managed by psychiatrist  She takes vitamin B12 supplements, last B12 level normal  HYPERLIPIDEMIA: Previously had triglycerides of about 450, repeat levels were better Unclear whether she is taking fenofibrate from her PCP now  Lab Results  Component Value Date   CHOL 184 02/16/2021   Lab Results  Component Value Date   HDL 37 (L) 02/16/2021   Lab Results  Component Value Date   LDLCALC 115 (H) 02/16/2021   Lab Results  Component Value Date   TRIG 179 (H) 02/16/2021   Lab Results  Component Value Date   CHOLHDL 5.0 (H) 02/16/2021   No results found for: "LDLDIRECT"  PREDIABETES:  A1c has been 5.9, last checked in 3/23  Her PCP is treating her with Ozempic for weight loss  Lab Results  Component  Value Date   HGBA1C 5.9 (H) 02/16/2021   Lab Results  Component Value Date   LDLCALC 115 (H) 02/16/2021   CREATININE 1.40 (H) 01/12/2022     EXAM:  There were no vitals taken for this visit.   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

## 2022-06-27 ENCOUNTER — Telehealth (INDEPENDENT_AMBULATORY_CARE_PROVIDER_SITE_OTHER): Payer: Medicare PPO | Admitting: Psychiatry

## 2022-06-27 ENCOUNTER — Encounter: Payer: Self-pay | Admitting: Psychiatry

## 2022-06-27 DIAGNOSIS — F411 Generalized anxiety disorder: Secondary | ICD-10-CM

## 2022-06-27 DIAGNOSIS — G4701 Insomnia due to medical condition: Secondary | ICD-10-CM | POA: Diagnosis not present

## 2022-06-27 DIAGNOSIS — F3176 Bipolar disorder, in full remission, most recent episode depressed: Secondary | ICD-10-CM

## 2022-06-27 MED ORDER — LAMOTRIGINE 150 MG PO TABS: 75.0000 mg | ORAL_TABLET | Freq: Two times a day (BID) | ORAL | 0 refills | Status: DC

## 2022-06-27 MED ORDER — ZALEPLON 10 MG PO CAPS: ORAL_CAPSULE | ORAL | 2 refills | Status: DC

## 2022-06-27 MED ORDER — ESCITALOPRAM OXALATE 10 MG PO TABS: 15.0000 mg | ORAL_TABLET | Freq: Every day | ORAL | 0 refills | Status: DC

## 2022-06-27 NOTE — Progress Notes (Unsigned)
Virtual Visit via Video Note  I connected with Kaylee Gordon on 06/27/22 at  3:00 PM EST by a video enabled telemedicine application and verified that I am speaking with the correct person using two identifiers.  Location Provider Location : [facility] Patient Location : Home  Participants: Patient , Provider   I discussed the limitations of evaluation and management by telemedicine and the availability of in person appointments. The patient expressed understanding and agreed to proceed.   I discussed the assessment and treatment plan with the patient. The patient was provided an opportunity to ask questions and all were answered. The patient agreed with the plan and demonstrated an understanding of the instructions.   The patient was advised to call back or seek an in-person evaluation if the symptoms worsen or if the condition fails to improve as anticipated.   [facility] MD OP Progress Note  06/27/2022 3:28 PM Kaylee Gordon  MRN:  [id number]  Chief Complaint:  Chief Complaint  Patient presents with   Follow-up   Depression   Anxiety   Medication Refill   HPI: Kaylee Gordon is a 60 year old Caucasian female who has a history of bipolar disorder, GAD, fibromyalgia, married, lives in [place] was evaluated by telemedicine today.  Patient reports she is currently struggling with GI problems, is nauseous, mild lightheadedness ongoing since the past few hours.  Agreeable to follow up with primary care provider.  Reports otherwise she has been doing fairly well with regards to her depression symptoms.  Denies any significant sadness hopelessness.  Continues to have anxiety, calls herself a Research officer, trade union, worries about everything to the extreme.  Reports she used to worry about her daughter not having a job however recently she was able to find a job.  Patient however reports no matter what she always finds something to worry about.  Continues to work with her therapist Ms. Miguel Dibble on  this.  Does have clonazepam available however has been trying to limit use.  Currently compliant on the Lexapro and Lamictal.  Denies side effects.  Reports sleep is overall okay.  Takes the zaleplon, denies side effects.  Patient appeared to be alert, oriented to person place time situation.  Denies any suicidality, homicidality or perceptual disturbances.  Denies any other concerns today.  Visit Diagnosis:    ICD-10-CM   1. Bipolar disorder, in full remission, most recent episode depressed (HCC)  F31.76 lamoTRIgine (LAMICTAL) 150 MG tablet    2. GAD (generalized anxiety disorder)  F41.1 lamoTRIgine (LAMICTAL) 150 MG tablet    escitalopram (LEXAPRO) 10 MG tablet    3. Insomnia due to medical condition  G47.01 zaleplon (SONATA) 10 MG capsule   anxiety      Past Psychiatric History: Reviewed past Juliann Pulse history from progress note on 09/11/2018.  Past Medical History:  Past Medical History:  Diagnosis Date   Anxiety    Asthma    B12 deficiency    Cancer ([place])    SKIN CANCER   CHF (congestive heart failure) ([place])    Depression    Hypertension    Thyroid disease     Past Surgical History:  Procedure Laterality Date   ANKLE SURGERY Right 2012   APPENDECTOMY     AUGMENTATION MAMMAPLASTY Bilateral    BREAST ENHANCEMENT SURGERY     COLONOSCOPY WITH PROPOFOL N/A 02/22/2015   Procedure: COLONOSCOPY WITH PROPOFOL;  Surgeon: Josefine Class, MD;  Location: [facility] ENDOSCOPY;  Service: Endoscopy;  Laterality: N/A;   ESOPHAGOGASTRODUODENOSCOPY (EGD) WITH  PROPOFOL N/A 02/22/2015   Procedure: ESOPHAGOGASTRODUODENOSCOPY (EGD) WITH PROPOFOL;  Surgeon: Josefine Class, MD;  Location: [facility] ENDOSCOPY;  Service: Endoscopy;  Laterality: N/A;   ESOPHAGOGASTRODUODENOSCOPY (EGD) WITH PROPOFOL N/A 02/11/2016   Procedure: ESOPHAGOGASTRODUODENOSCOPY (EGD) WITH PROPOFOL;  Surgeon: Manya Silvas, MD;  Location[facility] ENDOSCOPY;  Service: Endoscopy;  Laterality: N/A;   HEMORRHOID SURGERY     NASAL  SINUS SURGERY     SAVORY DILATION N/A 02/22/2015   Procedure: SAVORY DILATION;  Surgeon: Josefine Class, MD;  Location: [facility] ENDOSCOPY;  Service: Endoscopy;  Laterality: N/A;   SAVORY DILATION N/A 02/11/2016   Procedure: SAVORY DILATION;  Surgeon: Manya Silvas, MD;  Location: [facility] ENDOSCOPY;  Service: Endoscopy;  Laterality: N/A;   SKIN CANCER EXCISION      Family Psychiatric History: Reviewed from the psychiatric history from progress note on 09/11/2018.  Family History:  Family History  Problem Relation Age of Onset   Cancer Mother        colon cancer   Depression Mother    Anxiety disorder Mother    Heart disease Father    Cancer Father        ? neck cancer- still living.    Anxiety disorder Father    Depression Father    Hypoparathyroidism Son    Colon cancer Maternal Aunt        died in 31s.     Social History: Reviewed social history from progress note on 09/11/2018. Social History   Socioeconomic History   Marital status: Married    Spouse name: Legrand Como   Number of children: Not on file   Years of education: Not on file   Highest education level: Not on file  Occupational History   Not on file  Tobacco Use   Smoking status: Never   Smokeless tobacco: Never  Vaping Use   Vaping Use: Not on file  Substance and Sexual Activity   Alcohol use: No    Alcohol/week: 0.0 standard drinks of alcohol    Comment: MAYBE ONCE OR TWICE A MONTH   Drug use: No   Sexual activity: Not Currently    Partners: Male  Other Topics Concern   Not on file  Social History Narrative   Lives in [place]; taught high school- math; retd; no smoking; social alcohol.    Social Determinants of Health   Financial Resource Strain: Low Risk  (05/16/2018)   Overall Financial Resource Strain (CARDIA)    Difficulty of Paying Living Expenses: Not hard at all  Food Insecurity: No Food Insecurity (05/16/2018)   Hunger Vital Sign    Worried About Running Out of Food in the Last Year:  Never true    Ran Out of Food in the Last Year: Never true  Transportation Needs: No Transportation Needs (05/16/2018)   PRAPARE - Hydrologist (Medical): No    Lack of Transportation (Non-Medical): No  Physical Activity: Unknown (05/16/2018)   Exercise Vital Sign    Days of Exercise per Week: 0 days    Minutes of Exercise per Session: Not on file  Stress: Stress Concern Present (05/16/2018)   [place]    Feeling of Stress : Very much  Social Connections: Unknown (05/16/2018)   Social Connection and Isolation Panel [NHANES]    Frequency of Communication with Friends and Family: Twice a week    Frequency of Social Gatherings with Friends and Family: Twice a week  Attends Religious Services: More than 4 times per year    Active Member of Clubs or Organizations: Not on file    Attends Archivist Meetings: Not on file    Marital Status: Married    Allergies:  Allergies  Allergen Reactions   Levofloxacin Other (See Comments) and Nausea Only    Lethargic, Flu-like symptoms, Hot flashes    Atorvastatin    Hydrochlorothiazide Other (See Comments)    Intolerance - dry lips   Parafon Forte Dsc [Chlorzoxazone]    Pollen Extract    Pseudoephedrine Other (See Comments)   Sudafed [Pseudoephedrine Hcl]     Heart racing    Metabolic Disorder Labs: Lab Results  Component Value Date   HGBA1C 5.9 (H) 02/16/2021   Lab Results  Component Value Date   PROLACTIN 18.3 07/22/2014   Lab Results  Component Value Date   CHOL 184 02/16/2021   TRIG 179 (H) 02/16/2021   HDL 37 (L) 02/16/2021   CHOLHDL 5.0 (H) 02/16/2021   LDLCALC 115 (H) 02/16/2021   Lab Results  Component Value Date   TSH 3.30 12/05/2021   TSH 2.510 02/16/2021    Therapeutic Level Labs: No results found for: "LITHIUM" Lab Results  Component Value Date   VALPROATE 39 (L) 02/04/2019   No results found for:  "CBMZ"  Current Medications: Current Outpatient Medications  Medication Sig Dispense Refill   calcitRIOL (ROCALTROL) 0.25 MCG capsule TAKE ONE CAPSULE BY MOUTH DAILY 90 capsule 1   clonazePAM (KLONOPIN) 0.5 MG tablet Take 0.5 tablets (0.25 mg total) by mouth daily as needed for anxiety. 15 tablet 2   cyclobenzaprine (FLEXERIL) 5 MG tablet Take 5 mg by mouth 3 (three) times daily as needed.     esomeprazole (NEXIUM) 40 MG capsule Take 1 capsule by mouth daily.     fenofibrate 160 MG tablet Take 1 tablet by mouth daily.     fluticasone (FLONASE) 50 MCG/ACT nasal spray      gabapentin (NEURONTIN) 300 MG capsule Take 300 mg by mouth. 2 CAP AM, 1 CAP Q noon, 2 CAP pm,     lamoTRIgine (LAMICTAL) 25 MG tablet TAKE 1 TABLET BY MOUTH DAILY WITH 150 MG TABLET 90 tablet 0   levothyroxine (SYNTHROID) 50 MCG tablet Take 1 tablet Monday, Wednesday, Friday. Take 1 1/2 all other days 105 tablet 3   lisinopril (ZESTRIL) 10 MG tablet Take 10 mg by mouth daily.     lovastatin (MEVACOR) 20 MG tablet Take 20 mg by mouth at bedtime.     prazosin (MINIPRESS) 2 MG capsule TAKE 1 CAPSULE BY MOUTH AT BEDTIME 90 capsule 0   promethazine (PHENERGAN) 25 MG tablet Take 25 mg by mouth every 6 (six) hours as needed for nausea or vomiting.     propranolol (INNOPRAN XL) 80 MG 24 hr capsule Take by mouth.     Semaglutide, 2 MG/DOSE, 8 MG/3ML SOPN Inject into the skin.     azelastine (ASTELIN) 0.1 % nasal spray Place into the nose. (Patient not taking: Reported on 06/27/2022)     escitalopram (LEXAPRO) 10 MG tablet Take 1.5 tablets (15 mg total) by mouth daily. 135 tablet 0   lamoTRIgine (LAMICTAL) 150 MG tablet Take 0.5 tablets (75 mg total) by mouth 2 (two) times daily. 90 tablet 0   zaleplon (SONATA) 10 MG capsule TAKE ONE CAPSULE BY MOUTH EVERY NIGHT AT BEDTIME AS NEEDED FOR SLEEP 30 capsule 2   No current facility-administered medications for this visit.  Musculoskeletal: Strength & Muscle Tone:  UTA Gait &  Station:  Seated Patient leans: N/A  Psychiatric Specialty Exam: Review of Systems  Gastrointestinal:  Positive for nausea.  Psychiatric/Behavioral:  The patient is nervous/anxious.   All other systems reviewed and are negative.   There were no vitals taken for this visit.There is no height or weight on file to calculate BMI.  General Appearance: Casual  Eye Contact:  Good  Speech:  Clear and Coherent  Volume:  Normal  Mood:  Anxious  Affect:  Appropriate  Thought Process:  Goal Directed and Descriptions of Associations: Intact  Orientation:  Full (Time, Place, and Person)  Thought Content: Logical   Suicidal Thoughts:  No  Homicidal Thoughts:  No  Memory:  Immediate;   Fair Recent;   Fair Remote;   Fair  Judgement:  Fair  Insight:  Fair  Psychomotor Activity:  Normal  Concentration:  Concentration: Fair and Attention Span: Fair  Recall:  [employer] of Knowledge: Fair  Language: Fair  Akathisia:  No  Handed:  Right  AIMS (if indicated): not done  Assets:  Communication Skills Desire for Improvement Housing Social Support Transportation  ADL's:  Intact  Cognition: WNL  Sleep:  Fair   Screenings: AIMS    Flowsheet Row Video Visit from 12/08/2021 in [facility] Total Score 0      GAD-7    Flowsheet Row Video Visit from 03/28/2022 in [facility] Video Visit from 02/06/2022 in [facility] Video Visit from 01/24/2022 in [facility] [facility] Video Visit from 12/08/2021 in [facility] Video Visit from 10/03/2021 in [place]  Total GAD-7 Score '10 7 10 4 12      '$ PHQ2-9    Flowsheet Row Video Visit from 06/27/2022 in [facility] Video Visit from 03/28/2022 in [place] Video Visit from 02/06/2022 in [facility] Video  Visit from 01/24/2022 in [facility] Video Visit from 12/08/2021 in [facility]  PHQ-2 Total Score 0 0 0 0 1      Flowsheet Row Video Visit from 06/27/2022 in [place] Video Visit from 03/28/2022 in [place] [facility] ED from 03/21/2022 in [facility] [place] No Risk        Assessment and Plan: Kaylee Gordon is a 61 year old Caucasian female, married, lives in [place], has a history of bipolar disorder, GAD, fibromyalgia, IBS, history of QT prolongation on EKG was evaluated by telemedicine today.  Patient with GI symptoms agreeable to follow up with primary care provider, otherwise continues to have anxiety and is motivated to stay in therapy.  Discussed plan as noted below.  Plan Bipolar disorder in remission Lexapro 15 mg p.o. daily Lamotrigine 175 mg p.o. daily in divided dosage Continue CBT with Ms. Miguel Dibble  GAD-improving Clonazepam 0.25 mg daily as needed for severe anxiety attacks only.  Patient limiting use. Reviewed [place] PMP AWARxE Lexapro 15 mg p.o. daily  Insomnia-improving Prazosin 2 mg p.o. nightly Sonata 10 mg p.o. nightly as needed  Coordinated care with Ms. Miguel Dibble.  History of prolonged QT syndrome-patient to continue to follow-up with cardiology.  Patient advised to follow up with primary care provider for GI symptoms.  Follow-up in clinic in 3 months or sooner in person. Collaboration of Care: Collaboration of Care: Primary  Care Provider AEB encouraged to follow up with primary care for GI symptoms, Referral or follow-up with counselor/therapist AEB encouraged to continue to follow-up with therapist Ms. Miguel Dibble, and Other encouraged to follow-up with cardiology for history of QT prolongation.  Patient/Guardian was advised Release of Information must be obtained prior to any  record release in order to collaborate their care with an outside provider. Patient/Guardian was advised if they have not already done so to contact the registration department to sign all necessary forms in order for [place] to release information regarding their care.   Consent: Patient/Guardian gives verbal consent for treatment and assignment of benefits for services provided during this visit. Patient/Guardian expressed understanding and agreed to proceed.   This note was generated in part or whole with voice recognition software. Voice recognition is usually quite accurate but there are transcription errors that can and very often do occur. I apologize for any typographical errors that were not detected and corrected.      Ursula Alert, MD 06/27/2022, 3:28 PM

## 2022-07-11 ENCOUNTER — Ambulatory Visit
Admission: EM | Admit: 2022-07-11 | Discharge: 2022-07-11 | Disposition: A | Discharge status: Home / Self Care | Payer: Medicare PPO | Attending: Emergency Medicine | Admitting: Emergency Medicine

## 2022-07-11 DIAGNOSIS — J029 Acute pharyngitis, unspecified: Secondary | ICD-10-CM | POA: Insufficient documentation

## 2022-07-11 DIAGNOSIS — Z1152 Encounter for screening for COVID-19: Secondary | ICD-10-CM | POA: Diagnosis not present

## 2022-07-11 DIAGNOSIS — B349 Viral infection, unspecified: Secondary | ICD-10-CM | POA: Diagnosis present

## 2022-07-11 MED ORDER — LIDOCAINE VISCOUS HCL 2 % MT SOLN: 15.0000 mL | OROMUCOSAL | 0 refills | Status: DC | PRN

## 2022-07-11 NOTE — ED Provider Notes (Signed)
Kaylee Gordon    CSN: [id number] Arrival date & time: 07/11/22  1746      History   Chief Complaint Chief Complaint  Patient presents with   Sore Throat    Sore throat, nasal congestion, cough, chest congestion and nausea. X4 days    HPI Kaylee Gordon is a 61 y.o. female.  Patient presents with 4 day history of sore throat, congestion, mild cough, nausea, diarrhea.  No fever, wheezing, shortness of breath, vomiting, or other symptoms.  Treatment at home with Tylenol.   Her medical history includes asthma, hypertension, heart failure, prolonged QT syndrome, CKD, hyperlipidemia, hypothyroidism, borderline diabetes, lung nodules on CT, bipolar disorder, anxiety, depression,  The history is provided by the patient and medical records.    Past Medical History:  Diagnosis Date   Anxiety    Asthma    B12 deficiency    Cancer ([place])    SKIN CANCER   CHF (congestive heart failure) ([place])    Depression    Hypertension    Thyroid disease     Patient Active Problem List   Diagnosis Date Noted   Medicare annual wellness visit, initial 03/27/2022   History of prolonged Q-T interval on ECG 01/24/2022   Trochanteric bursitis of left hip 01/20/2022   Bereavement 10/03/2021   Bipolar 1 disorder, depressed, moderate ([place]) 10/03/2021   Elevated blood pressure reading 06/06/2021   Prolonged Q-T interval on ECG 06/02/2021   Prolonged QT syndrome 06/01/2021   Bipolar disorder, in full remission, most recent episode depressed ([place]) 05/24/2021   Bipolar 1 disorder, depressed, partial remission ([place]) 02/10/2021   History of colonic polyps 08/16/2020   CKD (chronic kidney disease) stage 3, GFR 30-59 ml/min ([place]) 08/16/2020   Insomnia due to medical condition 06/25/2020   At risk for prolonged QT interval syndrome 06/25/2020   Skull lesion 02/24/2020   Tremor 05/08/2019   Bipolar I disorder, most recent episode depressed ([place]) 12/30/2018   B12 deficiency 11/22/2018   Low iron  11/22/2018   History of normocytic normochromic anemia 11/21/2018   Iron deficiency anemia secondary to inadequate dietary iron intake 11/21/2018   Multiple lung nodules on CT 04/19/2018   Borderline diabetes mellitus 08/10/2017   Paroxysmal supraventricular tachycardia 09/27/2015   Benign essential HTN 06/22/2015   Combined fat and carbohydrate induced hyperlipemia 06/22/2015   Breathlessness on exertion 06/17/2015   Pure hypercholesterolemia 04/22/2015   Congestive heart failure with left ventricular systolic dysfunction ([place]) 03/24/2015   Acquired hypothyroidism 02/04/2015   Heart failure, systolic ([place]) 76/28/3151   Anxiety 12/28/2014   Allergic rhinitis 10/27/2014   Bipolar 1 disorder, depressed ([place]) 10/27/2014   Hypercholesterolemia without hypertriglyceridemia 10/27/2014   Depression, major, recurrent, moderate ([place]) 10/27/2014   Adult hypothyroidism 10/27/2014   GAD (generalized anxiety disorder) 10/27/2014   Essential (primary) hypertension 10/27/2014   Colon polyp 10/27/2014   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    Past Surgical History:  Procedure Laterality Date   ANKLE SURGERY Right 2012   APPENDECTOMY     AUGMENTATION MAMMAPLASTY Bilateral    BREAST ENHANCEMENT SURGERY     COLONOSCOPY WITH PROPOFOL N/A 02/22/2015   Procedure: COLONOSCOPY WITH PROPOFOL;  Surgeon: Josefine Class, MD;  Location: [facility] ENDOSCOPY;  Service: Endoscopy;  Laterality: N/A;   ESOPHAGOGASTRODUODENOSCOPY (EGD) WITH PROPOFOL N/A 02/22/2015   Procedure: ESOPHAGOGASTRODUODENOSCOPY (EGD) WITH PROPOFOL;  Surgeon: Josefine Class, MD;  Location: [facility] ENDOSCOPY;  Service: Endoscopy;  Laterality: N/A;   ESOPHAGOGASTRODUODENOSCOPY (EGD) WITH PROPOFOL N/A 02/11/2016   Procedure:  ESOPHAGOGASTRODUODENOSCOPY (EGD) WITH PROPOFOL;  Surgeon: Manya Silvas, MD;  Location: [facility] ENDOSCOPY;  Service: Endoscopy;  Laterality: N/A;   HEMORRHOID SURGERY     NASAL SINUS SURGERY     SAVORY DILATION N/A 02/22/2015    Procedure: SAVORY DILATION;  Surgeon: Josefine Class, MD;  Location: [facility] ENDOSCOPY;  Service: Endoscopy;  Laterality: N/A;   SAVORY DILATION N/A 02/11/2016   Procedure: SAVORY DILATION;  Surgeon: Manya Silvas, MD;  Location: [facility] ENDOSCOPY;  Service: Endoscopy;  Laterality: N/A;   SKIN CANCER EXCISION      OB History   No obstetric history on file.      Home Medications    Prior to Admission medications   Medication Sig Start Date End Date Taking? Authorizing Provider  calcitRIOL (ROCALTROL) 0.25 MCG capsule TAKE ONE CAPSULE BY MOUTH DAILY 05/08/22  Yes Elayne Snare, MD  clonazePAM (KLONOPIN) 0.5 MG tablet Take 0.5 tablets (0.25 mg total) by mouth daily as needed for anxiety. 04/06/22  Yes Ursula Alert, MD  cyclobenzaprine (FLEXERIL) 5 MG tablet Take 5 mg by mouth 3 (three) times daily as needed. 01/20/22  Yes [provider]  escitalopram (LEXAPRO) 10 MG tablet Take 1.5 tablets (15 mg total) by mouth daily. 06/27/22  Yes Ursula Alert, MD  esomeprazole (NEXIUM) 40 MG capsule Take 1 capsule by mouth daily. 06/05/22  Yes [provider]  fenofibrate 160 MG tablet Take 1 tablet by mouth daily. 12/17/20  Yes [provider]  fluticasone Asencion Islam) 50 MCG/ACT nasal spray  12/25/12  Yes [provider]  gabapentin (NEURONTIN) 300 MG capsule Take 300 mg by mouth. 2 CAP AM, 1 CAP Q noon, 2 CAP pm, 10/06/19  Yes [provider]  lamoTRIgine (LAMICTAL) 150 MG tablet Take 0.5 tablets (75 mg total) by mouth 2 (two) times daily. 06/27/22  Yes Ursula Alert, MD  lamoTRIgine (LAMICTAL) 25 MG tablet TAKE 1 TABLET BY MOUTH DAILY WITH 150 MG TABLET 04/24/22  Yes Ursula Alert, MD  levothyroxine (SYNTHROID) 50 MCG tablet Take 1 tablet Monday, Wednesday, Friday. Take 1 1/2 all other days 12/07/21  Yes Elayne Snare, MD  lidocaine (XYLOCAINE) 2 % solution Use as directed 15 mLs in the mouth or throat as needed for mouth pain. 07/11/22  Yes Sharion Balloon, NP   lisinopril (ZESTRIL) 10 MG tablet Take 10 mg by mouth daily. 05/26/19  Yes [provider]  lovastatin (MEVACOR) 20 MG tablet Take 20 mg by mouth at bedtime.   Yes [provider]  prazosin (MINIPRESS) 2 MG capsule TAKE 1 CAPSULE BY MOUTH AT BEDTIME 05/09/22  Yes Hisada, Elie Goody, MD  promethazine (PHENERGAN) 25 MG tablet Take 25 mg by mouth every 6 (six) hours as needed for nausea or vomiting.   Yes [provider]  propranolol (INNOPRAN XL) 80 MG 24 hr capsule Take by mouth. 07/23/19  Yes [provider]  Semaglutide, 2 MG/DOSE, 8 MG/3ML SOPN Inject into the skin. 01/19/22  Yes [provider]  zaleplon (SONATA) 10 MG capsule TAKE ONE CAPSULE BY MOUTH EVERY NIGHT AT BEDTIME AS NEEDED FOR SLEEP 06/27/22  Yes Eappen, Ria Clock, MD  azelastine (ASTELIN) 0.1 % nasal spray Place into the nose. Patient not taking: Reported on 06/27/2022 10/11/18 06/27/22  [provider]    Family History Family History  Problem Relation Age of Onset   Cancer Mother        colon cancer   Depression Mother    Anxiety disorder Mother    Heart  disease Father    Cancer Father        ? neck cancer- still living.    Anxiety disorder Father    Depression Father    Hypoparathyroidism Son    Colon cancer Maternal Aunt        died in 106s.     Social History Social History   Tobacco Use   Smoking status: Never   Smokeless tobacco: Never  Substance Use Topics   Alcohol use: No    Alcohol/week: 0.0 standard drinks of alcohol    Comment: MAYBE ONCE OR TWICE A MONTH   Drug use: No     Allergies   Levofloxacin, Atorvastatin, Hydrochlorothiazide, Parafon forte dsc [chlorzoxazone], Pollen extract, Pseudoephedrine, and Sudafed [pseudoephedrine hcl]   Review of Systems Review of Systems  Constitutional:  Negative for chills and fever.  HENT:  Positive for congestion and sore throat. Negative for ear pain.   Respiratory:  Positive for cough. Negative for shortness  of breath.   Cardiovascular:  Negative for chest pain and palpitations.  Gastrointestinal:  Positive for diarrhea and nausea. Negative for abdominal pain and vomiting.  Skin:  Negative for color change and rash.  All other systems reviewed and are negative.    Physical Exam Triage Vital Signs ED Triage Vitals  Enc Vitals Group     BP      Pulse      Resp      Temp      Temp src      SpO2      Weight      Height      Head Circumference      Peak Flow      Pain Score      Pain Loc      Pain Edu?      Excl. in [place]?    No data found.  Updated Vital Signs BP 110/74 (BP Location: Left Arm)   Pulse 85   Temp 99.4 F (37.4 C) (Oral)   Resp 16   Ht 5' 4.5" (1.638 m)   Wt 149 lb (67.6 kg)   SpO2 96%   BMI 25.18 kg/m   Visual Acuity Right Eye Distance:   Left Eye Distance:   Bilateral Distance:    Right Eye Near:   Left Eye Near:    Bilateral Near:     Physical Exam Vitals and nursing note reviewed.  Constitutional:      General: She is not in acute distress.    Appearance: Normal appearance. She is well-developed. She is not ill-appearing.  HENT:     Right Ear: Tympanic membrane normal.     Left Ear: Tympanic membrane normal.     Nose: Nose normal.     Mouth/Throat:     Mouth: Mucous membranes are moist.     Pharynx: Oropharynx is clear.     Comments: Clear PND.  Eyes:     Conjunctiva/sclera: Conjunctivae normal.  Cardiovascular:     Rate and Rhythm: Normal rate and regular rhythm.     Heart sounds: Normal heart sounds.  Pulmonary:     Effort: Pulmonary effort is normal. No respiratory distress.     Breath sounds: Normal breath sounds.  Musculoskeletal:     Cervical back: Neck supple.  Skin:    General: Skin is warm and dry.  Neurological:     Mental Status: She is alert.  Psychiatric:        Mood and Affect: Mood normal.  Behavior: Behavior normal.      UC Treatments / Results  Labs (all labs ordered are listed, but only abnormal  results are displayed) Labs Reviewed  SARS CORONAVIRUS 2 (TAT 6-24 HRS)    EKG   Radiology No results found.  Procedures Procedures (including critical care time)  Medications Ordered in UC Medications - No data to display  Initial Impression / Assessment and Plan / UC Course  I have reviewed the triage vital signs and the nursing notes.  Pertinent labs & imaging results that were available during my care of the patient were reviewed by me and considered in my medical decision making (see chart for details).   Viral illness, sore throat.  COVID pending.  If COVID positive, would recommend treatment with molnupiravir.  Last GFR 43 on 01/12/2022.  Treating sore throat with viscous lidocaine.  Discussed symptomatic treatment including Tylenol, rest, hydration.  Instructed patient to follow up with PCP if symptoms are not improving.  She agrees to plan of care.    Final Clinical Impressions(s) / UC Diagnoses   Final diagnoses:  Viral illness  Sore throat     Discharge Instructions      Use the viscous lidocaine as directed.    Your COVID test is pending.    Take Tylenol as needed for fever or discomfort.  Rest and keep yourself hydrated.    Follow-up with your primary care provider if your symptoms are not improving.            ED Prescriptions     Medication Sig Dispense Auth. Provider   lidocaine (XYLOCAINE) 2 % solution Use as directed 15 mLs in the mouth or throat as needed for mouth pain. 100 mL Sharion Balloon, NP      PDMP not reviewed this encounter.   Sharion Balloon, NP 07/11/22 [place] [place]

## 2022-07-11 NOTE — ED Triage Notes (Signed)
Pt states that she has a sore throat, nasal congestion, cough, chest congestion and nausea. X4 days

## 2022-07-11 NOTE — Discharge Instructions (Addendum)
Use the viscous lidocaine as directed.    Your COVID test is pending.    Take Tylenol as needed for fever or discomfort.  Rest and keep yourself hydrated.    Follow-up with your primary care provider if your symptoms are not improving.

## 2022-07-13 LAB — SARS CORONAVIRUS 2 (TAT 6-24 HRS): SARS Coronavirus 2: NEGATIVE

## 2022-07-14 ENCOUNTER — Telehealth: Payer: Self-pay | Admitting: Emergency Medicine

## 2022-07-14 MED ORDER — AMOXICILLIN 875 MG PO TABS: 875.0000 mg | ORAL_TABLET | Freq: Two times a day (BID) | ORAL | 0 refills | Status: AC

## 2022-07-14 NOTE — Telephone Encounter (Signed)
Patient called UC to report her symptoms are not improving.  She has continued congestion, sore throat, cough.  Treating with amoxicillin and instructed patient to follow up with her PCP.

## 2022-07-18 ENCOUNTER — Ambulatory Visit: Payer: Medicare PPO | Admitting: Endocrinology

## 2022-07-18 VITALS — BP 124/72 | HR 86 | Ht 64.5 in | Wt 152.6 lb

## 2022-07-18 DIAGNOSIS — E200 Idiopathic hypoparathyroidism: Secondary | ICD-10-CM | POA: Diagnosis not present

## 2022-07-18 DIAGNOSIS — E038 Other specified hypothyroidism: Secondary | ICD-10-CM | POA: Diagnosis not present

## 2022-07-18 NOTE — Progress Notes (Unsigned)
Patient ID: Kaylee Gordon, female   DOB: [birth year], 62 y.o.   MRN: [id number]    History of Present Illness:  Chief complaint: Endocrinology follow-up   Secondary hypothyroidism:  She was initially given thyroxine supplement with a borderline TSH levels in 2011 However because of complaints of  fatigue on her visit in 10/15 she had thyroid levels done and free T4 was significantly low She was empirically started on 25 g  of levothyroxine Initially she felt less tired with this; however her free T4 continue to be low and her dose had been increased progressively  She is taking 75 mcg levothyroxine when her free T4 was relatively low in 6/21 However because of high normal free T4 in 8/22 she is now taking 50 mcg TSH usually is normal  She has longstanding fatigue which is somewhat better but may be worse when she has more fibromyalgia No hair loss or cold intolerance  She is taking her levothyroxine daily in the morning before breakfast  She takes her calcium in the evening after dinner TSH and T4 pending  Wt Readings from Last 3 Encounters:  07/18/22 152 lb 9.6 oz (69.2 kg)  07/11/22 149 lb (67.6 kg)  03/21/22 148 lb (67.1 kg)    Lab Results  Component Value Date   FREET4 0.68 12/05/2021   FREET4 0.93 06/06/2021   FREET4 1.53 02/16/2021   TSH 3.30 12/05/2021   TSH 2.510 02/16/2021   TSH 1.01 08/05/2020    Hypoparathyroidism    Has had hypocalcemia since she was 62 years old and presented with symptoms of cramping in her hands along with twitching of her face and eyes. 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   No symptoms of twitching/muscle cramping in her hands or feet, has no tingling or numbness in her face  Occasionally has numbness in her hands  She is taking calcitriol 0.25 mcg daily in the morning and her calcium once a day in the evenings She takes her supplements very regularly  Last calcium  normal as below  Lab Results  Component Value Date   CALCIUM 9.1 01/12/2022   CALCIUM 9.1 12/05/2021   CALCIUM 9.1 06/06/2021   CALCIUM 9.6 02/16/2021   CALCIUM 9.1 04/05/2020   Lab Results  Component Value Date   K 3.5 01/12/2022      Allergies as of 07/18/2022       Reactions   Levofloxacin Other (See Comments), Nausea Only   Lethargic, Flu-like symptoms, Hot flashes    Atorvastatin    Hydrochlorothiazide Other (See Comments)   Intolerance - dry lips   Parafon Forte Dsc [chlorzoxazone]    Pollen Extract    Pseudoephedrine Other (See Comments)   Sudafed [pseudoephedrine Hcl]    Heart racing        Medication List        Accurate as of July 18, 2022  2:54 PM. If you have any questions, ask your nurse or doctor.          amoxicillin 875 MG tablet Commonly known as: AMOXIL Take 1 tablet (875 mg total) by mouth 2 (two) times daily for 10 days.   azelastine 0.1 % nasal spray Commonly known as: ASTELIN Place into the nose.   calcitRIOL 0.25 MCG capsule Commonly known as: ROCALTROL TAKE ONE CAPSULE BY MOUTH DAILY   clonazePAM 0.5 MG tablet Commonly known as: KlonoPIN Take 0.5 tablets (0.25 mg total)  by mouth daily as needed for anxiety.   cyclobenzaprine 5 MG tablet Commonly known as: FLEXERIL Take 5 mg by mouth 3 (three) times daily as needed.   escitalopram 10 MG tablet Commonly known as: Lexapro Take 1.5 tablets (15 mg total) by mouth daily.   esomeprazole 40 MG capsule Commonly known as: NEXIUM Take 1 capsule by mouth daily.   fenofibrate 160 MG tablet Take 1 tablet by mouth daily.   fluticasone 50 MCG/ACT nasal spray Commonly known as: FLONASE   gabapentin 300 MG capsule Commonly known as: NEURONTIN Take 300 mg by mouth. 2 CAP AM, 1 CAP Q noon, 2 CAP pm,   lamoTRIgine 25 MG tablet Commonly known as: LAMICTAL TAKE 1 TABLET BY MOUTH DAILY WITH 150 MG TABLET   lamoTRIgine 150 MG tablet Commonly known as: LAMICTAL Take 0.5 tablets (75  mg total) by mouth 2 (two) times daily.   levothyroxine 50 MCG tablet Commonly known as: SYNTHROID Take 1 tablet Monday, Wednesday, Friday. Take 1 1/2 all other days   lidocaine 2 % solution Commonly known as: XYLOCAINE Use as directed 15 mLs in the mouth or throat as needed for mouth pain.   lisinopril 10 MG tablet Commonly known as: ZESTRIL Take 10 mg by mouth daily.   lovastatin 20 MG tablet Commonly known as: MEVACOR Take 20 mg by mouth at bedtime.   prazosin 2 MG capsule Commonly known as: MINIPRESS TAKE 1 CAPSULE BY MOUTH AT BEDTIME   promethazine 25 MG tablet Commonly known as: PHENERGAN Take 25 mg by mouth every 6 (six) hours as needed for nausea or vomiting.   propranolol 80 MG 24 hr capsule Commonly known as: INNOPRAN XL Take by mouth.   Semaglutide (2 MG/DOSE) 8 MG/3ML Sopn Inject into the skin.   zaleplon 10 MG capsule Commonly known as: SONATA TAKE ONE CAPSULE BY MOUTH EVERY NIGHT AT BEDTIME AS NEEDED FOR SLEEP        Allergies:  Allergies  Allergen Reactions   Levofloxacin Other (See Comments) and Nausea Only    Lethargic, Flu-like symptoms, Hot flashes    Atorvastatin    Hydrochlorothiazide Other (See Comments)    Intolerance - dry lips   Parafon Forte Dsc [Chlorzoxazone]    Pollen Extract    Pseudoephedrine Other (See Comments)   Sudafed [Pseudoephedrine Hcl]     Heart racing    Past Medical History:  Diagnosis Date   Anxiety    Asthma    B12 deficiency    Cancer ([place])    SKIN CANCER   CHF (congestive heart failure) ([place])    Depression    Hypertension    Thyroid disease     Past Surgical History:  Procedure Laterality Date   ANKLE SURGERY Right 2012   APPENDECTOMY     AUGMENTATION MAMMAPLASTY Bilateral    BREAST ENHANCEMENT SURGERY     COLONOSCOPY WITH PROPOFOL N/A 02/22/2015   Procedure: COLONOSCOPY WITH PROPOFOL;  Surgeon: Josefine Class, MD;  Location: [facility] ENDOSCOPY;  Service: Endoscopy;  Laterality: N/A;    ESOPHAGOGASTRODUODENOSCOPY (EGD) WITH PROPOFOL N/A 02/22/2015   Procedure: ESOPHAGOGASTRODUODENOSCOPY (EGD) WITH PROPOFOL;  Surgeon: Josefine Class, MD;  Location[facility] ENDOSCOPY;  Service: Endoscopy;  Laterality: N/A;   ESOPHAGOGASTRODUODENOSCOPY (EGD) WITH PROPOFOL N/A 02/11/2016   Procedure: ESOPHAGOGASTRODUODENOSCOPY (EGD) WITH PROPOFOL;  Surgeon: Manya Silvas, MD;  Location: [facility] ENDOSCOPY;  Service: Endoscopy;  Laterality: N/A;   HEMORRHOID SURGERY     NASAL SINUS SURGERY     SAVORY DILATION N/A  02/22/2015   Procedure: SAVORY DILATION;  Surgeon: Josefine Class, MD;  Location: [facility] ENDOSCOPY;  Service: Endoscopy;  Laterality: N/A;   SAVORY DILATION N/A 02/11/2016   Procedure: SAVORY DILATION;  Surgeon: Manya Silvas, MD;  Location: [facility] ENDOSCOPY;  Service: Endoscopy;  Laterality: N/A;   SKIN CANCER EXCISION      Family History  Problem Relation Age of Onset   Cancer Mother        colon cancer   Depression Mother    Anxiety disorder Mother    Heart disease Father    Cancer Father        ? neck cancer- still living.    Anxiety disorder Father    Depression Father    Hypoparathyroidism Son    Colon cancer Maternal Aunt        died in 62s.     Social History:  reports that she has never smoked. She has never used smokeless tobacco. She reports that she does not drink alcohol and does not use drugs.  Review of Systems    History of depression, is managed by psychiatrist  She takes vitamin B12 supplements, last B12 level normal  HYPERLIPIDEMIA: Previously had triglycerides of about 450, repeat levels were better Unclear whether she is taking fenofibrate from her PCP now  Lab Results  Component Value Date   CHOL 184 02/16/2021   Lab Results  Component Value Date   HDL 37 (L) 02/16/2021   Lab Results  Component Value Date   LDLCALC 115 (H) 02/16/2021   Lab Results  Component Value Date   TRIG 179 (H) 02/16/2021   Lab Results  Component Value Date    CHOLHDL 5.0 (H) 02/16/2021   No results found for: "LDLDIRECT"  PREDIABETES:  A1c has been 5.9, last checked in 3/23  Her PCP is treating her with Ozempic for weight loss  Lab Results  Component Value Date   HGBA1C 5.9 (H) 02/16/2021   Lab Results  Component Value Date   LDLCALC 115 (H) 02/16/2021   CREATININE 1.40 (H) 01/12/2022     EXAM:  BP 124/72 (BP Location: Left Arm, Patient Position: Sitting, Cuff Size: Normal)   Pulse 86   Ht 5' 4.5" (1.638 m)   Wt 152 lb 9.6 oz (69.2 kg)   SpO2 98%   BMI 25.79 kg/m     Assessment/Plan:    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

## 2022-07-19 LAB — BASIC METABOLIC PANEL
BUN: 18 mg/dL (ref 6–23)
CO2: 24 mEq/L (ref 19–32)
Calcium: 9.1 mg/dL (ref 8.4–10.5)
Chloride: 105 mEq/L (ref 96–112)
Creatinine, Ser: 1.08 mg/dL (ref 0.40–1.20)
GFR: 54.91 mL/min — ABNORMAL LOW (ref >60.00)
Glucose, Bld: 112 mg/dL — ABNORMAL HIGH (ref 70–99)
Potassium: 3.7 mEq/L (ref 3.5–5.1)
Sodium: 139 mEq/L (ref 135–145)

## 2022-07-19 LAB — TSH: TSH: 1.19 u[IU]/mL (ref 0.35–5.50)

## 2022-07-19 LAB — T4, FREE: Free T4: 1.08 ng/dL (ref 0.60–1.60)

## 2022-07-20 MED ORDER — LEVOTHYROXINE SODIUM 75 MCG PO TABS: 75.0000 ug | ORAL_TABLET | Freq: Every day | ORAL | 3 refills | Status: DC

## 2022-07-20 NOTE — Addendum Note (Signed)
Addended by: Cinda Quest on: 07/20/2022 04:24 PM   Modules accepted: Orders

## 2022-07-23 ENCOUNTER — Other Ambulatory Visit: Payer: Self-pay | Admitting: Psychiatry

## 2022-07-23 DIAGNOSIS — F411 Generalized anxiety disorder: Secondary | ICD-10-CM

## 2022-08-07 ENCOUNTER — Encounter: Payer: Self-pay | Admitting: *Deleted

## 2022-08-08 ENCOUNTER — Ambulatory Visit: Payer: Medicare PPO | Admitting: Certified Registered"

## 2022-08-08 ENCOUNTER — Encounter
Admission: RE | Disposition: A | Discharge status: Home / Self Care | Payer: Self-pay | Source: Home / Self Care | Attending: Gastroenterology

## 2022-08-08 ENCOUNTER — Ambulatory Visit
Admission: RE | Admit: 2022-08-08 | Discharge: 2022-08-08 | Disposition: A | Discharge status: Home / Self Care | Payer: Medicare PPO | Attending: Gastroenterology | Admitting: Gastroenterology

## 2022-08-08 ENCOUNTER — Encounter: Payer: Self-pay | Admitting: *Deleted

## 2022-08-08 DIAGNOSIS — F419 Anxiety disorder, unspecified: Secondary | ICD-10-CM | POA: Diagnosis not present

## 2022-08-08 DIAGNOSIS — D125 Benign neoplasm of sigmoid colon: Secondary | ICD-10-CM | POA: Diagnosis not present

## 2022-08-08 DIAGNOSIS — F319 Bipolar disorder, unspecified: Secondary | ICD-10-CM | POA: Insufficient documentation

## 2022-08-08 DIAGNOSIS — E039 Hypothyroidism, unspecified: Secondary | ICD-10-CM | POA: Insufficient documentation

## 2022-08-08 DIAGNOSIS — K641 Second degree hemorrhoids: Secondary | ICD-10-CM | POA: Diagnosis not present

## 2022-08-08 DIAGNOSIS — I509 Heart failure, unspecified: Secondary | ICD-10-CM | POA: Diagnosis not present

## 2022-08-08 DIAGNOSIS — R1310 Dysphagia, unspecified: Secondary | ICD-10-CM | POA: Diagnosis not present

## 2022-08-08 DIAGNOSIS — Z800 Family history of malignant neoplasm of digestive organs: Secondary | ICD-10-CM | POA: Insufficient documentation

## 2022-08-08 DIAGNOSIS — Z1211 Encounter for screening for malignant neoplasm of colon: Secondary | ICD-10-CM | POA: Insufficient documentation

## 2022-08-08 DIAGNOSIS — I110 Hypertensive heart disease with heart failure: Secondary | ICD-10-CM | POA: Insufficient documentation

## 2022-08-08 DIAGNOSIS — E538 Deficiency of other specified B group vitamins: Secondary | ICD-10-CM | POA: Diagnosis not present

## 2022-08-08 DIAGNOSIS — Z86010 Personal history of colonic polyps: Secondary | ICD-10-CM | POA: Diagnosis not present

## 2022-08-08 HISTORY — PX: ESOPHAGOGASTRODUODENOSCOPY (EGD) WITH PROPOFOL: SHX5813

## 2022-08-08 HISTORY — PX: COLONOSCOPY WITH PROPOFOL: SHX5780

## 2022-08-08 SURGERY — COLONOSCOPY WITH PROPOFOL
Anesthesia: General

## 2022-08-08 MED ORDER — LIDOCAINE HCL (CARDIAC) PF 100 MG/5ML IV SOSY
PREFILLED_SYRINGE | INTRAVENOUS | Status: DC | PRN
  Administered 2022-08-08: 100 mg via INTRAVENOUS

## 2022-08-08 MED ORDER — PROPOFOL 10 MG/ML IV BOLUS
INTRAVENOUS | Status: DC | PRN
  Administered 2022-08-08: 200 mg via INTRAVENOUS

## 2022-08-08 MED ORDER — ROCURONIUM BROMIDE 100 MG/10ML IV SOLN
INTRAVENOUS | Status: DC | PRN
  Administered 2022-08-08: 10 mg via INTRAVENOUS

## 2022-08-08 MED ORDER — SODIUM CHLORIDE 0.9 % IV SOLN: INTRAVENOUS | Status: DC

## 2022-08-08 MED ORDER — SUCCINYLCHOLINE CHLORIDE 200 MG/10ML IV SOSY
PREFILLED_SYRINGE | INTRAVENOUS | Status: DC | PRN
  Administered 2022-08-08: 100 mg via INTRAVENOUS

## 2022-08-08 MED ORDER — SUGAMMADEX SODIUM 200 MG/2ML IV SOLN
INTRAVENOUS | Status: DC | PRN
  Administered 2022-08-08: 100 mg via INTRAVENOUS

## 2022-08-08 MED ORDER — PHENYLEPHRINE 80 MCG/ML (10ML) SYRINGE FOR IV PUSH (FOR BLOOD PRESSURE SUPPORT)
PREFILLED_SYRINGE | INTRAVENOUS | Status: DC | PRN
  Administered 2022-08-08: 80 ug via INTRAVENOUS

## 2022-08-08 MED ORDER — GLYCOPYRROLATE 0.2 MG/ML IJ SOLN
INTRAMUSCULAR | Status: DC | PRN
  Administered 2022-08-08: .2 mg via INTRAVENOUS

## 2022-08-08 MED ORDER — LACTATED RINGERS IV SOLN: INTRAVENOUS | Status: DC | PRN

## 2022-08-08 MED ORDER — ONDANSETRON HCL 4 MG/2ML IJ SOLN
INTRAMUSCULAR | Status: DC | PRN
  Administered 2022-08-08: 4 mg via INTRAVENOUS

## 2022-08-08 NOTE — Op Note (Signed)
[facility] [place] [facility] Patient Name: Kaylee Gordon Procedure Date: 08/08/2022 9:12 AM MRN: [id number] Account #: [id number] Date of Birth: 1961/01/08 Admit Type: Outpatient Age: 62 Room: [facility] ENDO ROOM 1 Gender: Female Note Status: Finalized Instrument Name: Altamese Cabal Endoscope [id number] Procedure:             Upper GI endoscopy Indications:           Dysphagia Providers:             Andrey Farmer MD, MD Medicines:             General Anesthesia Complications:         No immediate complications. Estimated blood loss:                         Minimal. Procedure:             Pre-Anesthesia Assessment:                        - Prior to the procedure, a History and Physical was                         performed, and patient medications and allergies were                         reviewed. The patient is competent. The risks and                         benefits of the procedure and the sedation options and                         risks were discussed with the patient. All questions                         were answered and informed consent was obtained.                         Patient identification and proposed procedure were                         verified by the physician, the nurse, the                         anesthesiologist, the anesthetist and the technician                         in the endoscopy suite. Mental Status Examination:                         alert and oriented. Airway Examination: normal                         oropharyngeal airway and neck mobility. Respiratory                         Examination: clear to auscultation. CV Examination:                         normal. Prophylactic Antibiotics: The patient does not  require prophylactic antibiotics. Prior                         Anticoagulants: The patient has taken no anticoagulant                         or antiplatelet agents. ASA Grade Assessment: II - A                          patient with mild systemic disease. After reviewing                         the risks and benefits, the patient was deemed in                         satisfactory condition to undergo the procedure. The                         anesthesia plan was to use monitored anesthesia care                         (MAC). Immediately prior to administration of                         medications, the patient was re-assessed for adequacy                         to receive sedatives. The heart rate, respiratory                         rate, oxygen saturations, blood pressure, adequacy of                         pulmonary ventilation, and response to care were                         monitored throughout the procedure. The physical                         status of the patient was re-assessed after the                         procedure.                        After obtaining informed consent, the endoscope was                         passed under direct vision. Throughout the procedure,                         the patient's blood pressure, pulse, and oxygen                         saturations were monitored continuously. The Endoscope                         was introduced through the mouth, and advanced to the  second part of duodenum. The upper GI endoscopy was                         accomplished without difficulty. The patient tolerated                         the procedure well. Findings:      No endoscopic abnormality was evident in the esophagus to explain the       patient's complaint of dysphagia. Biopsies were obtained from the       proximal and distal esophagus with cold forceps for histology of       suspected eosinophilic esophagitis. Estimated blood loss was minimal.      The exam of the esophagus was otherwise normal.      The entire examined stomach was normal.      The examined duodenum was normal. Impression:            - No endoscopic esophageal  abnormality to explain                         patient's dysphagia.                        - Normal stomach.                        - Normal examined duodenum.                        - Biopsies were taken with a cold forceps for                         evaluation of eosinophilic esophagitis. Recommendation:        - Await pathology results. If further issues with                         dysphagia, would recommend barium swallow.                        - Perform a colonoscopy today. Procedure Code(s):     --- Professional ---                        [phone], Esophagogastroduodenoscopy, flexible,                         transoral; with biopsy, single or multiple Diagnosis Code(s):     --- Professional ---                        R13.10, Dysphagia, unspecified CPT copyright 2022 American Medical Association. All rights reserved. The codes documented in this report are preliminary and upon coder review may  be revised to meet current compliance requirements. Andrey Farmer MD, MD 08/08/2022 10:28:56 AM Number of Addenda: 0 Note Initiated On: 08/08/2022 9:12 AM Estimated Blood Loss:  Estimated blood loss was minimal.      [facility]

## 2022-08-08 NOTE — Transfer of Care (Signed)
Immediate Anesthesia Transfer of Care Note  Patient: Kaylee Gordon  Procedure(s) Performed: COLONOSCOPY WITH PROPOFOL ESOPHAGOGASTRODUODENOSCOPY (EGD) WITH PROPOFOL  Patient Location: Endoscopy Unit  Anesthesia Type:General  Level of Consciousness: awake, drowsy, and patient cooperative  Airway & Oxygen Therapy: Patient Spontanous Breathing  Post-op Assessment: Report given to RN and Post -op Vital signs reviewed and stable  Post vital signs: Reviewed and stable  Last Vitals:  Vitals Value Taken Time  BP 101/59 08/08/22 1053  Temp    Pulse 87 08/08/22 1053  Resp 24 08/08/22 1055  SpO2 99 % 08/08/22 1053  Vitals shown include unvalidated device data.  Last Pain:  Vitals:   08/08/22 1052  TempSrc:   PainSc: 0-No pain         Complications: No notable events documented.

## 2022-08-08 NOTE — H&P (Signed)
Outpatient short stay form Pre-procedure 08/08/2022  Kaylee Rubenstein, MD  Primary Physician: Kaylee Body, MD  Reason for visit:  Dysphagia/Surveillance colonoscopy  History of present illness:    62 y/o lady with hypothyroidism, bipolar, and hypertension here for EGD/Colonoscopy for dysphagia and history of large (12 mm polyp) on her last colonoscopy in 2019. No blood thinners. Mother with colon cancer in her 44's. History of appendectomy.    Current Facility-Administered Medications:    0.9 %  sodium chloride infusion, , Intravenous, Continuous, Nechuma Boven, Hilton Cork, MD, Last Rate: 20 mL/hr at 08/08/22 0934, New Bag at 08/08/22 0934  Medications Prior to Admission  Medication Sig Dispense Refill Last Dose   gabapentin (NEURONTIN) 300 MG capsule Take 300 mg by mouth. 2 CAP AM, 1 CAP Q noon, 2 CAP pm,   08/08/2022   levothyroxine (SYNTHROID) 75 MCG tablet Take 1 tablet (75 mcg total) by mouth daily. 90 tablet 3 08/08/2022   lisinopril (ZESTRIL) 10 MG tablet Take 10 mg by mouth daily.   08/08/2022   propranolol (INNOPRAN XL) 80 MG 24 hr capsule Take by mouth.   08/08/2022 at 0730   azelastine (ASTELIN) 0.1 % nasal spray Place into the nose.      calcitRIOL (ROCALTROL) 0.25 MCG capsule TAKE ONE CAPSULE BY MOUTH DAILY 90 capsule 1    clonazePAM (KLONOPIN) 0.5 MG tablet Take 0.5 tablets (0.25 mg total) by mouth daily as needed for anxiety. 15 tablet 2    cyclobenzaprine (FLEXERIL) 5 MG tablet Take 5 mg by mouth 3 (three) times daily as needed.      escitalopram (LEXAPRO) 10 MG tablet Take 1.5 tablets (15 mg total) by mouth daily. 135 tablet 0    esomeprazole (NEXIUM) 40 MG capsule Take 1 capsule by mouth daily.      fenofibrate 160 MG tablet Take 1 tablet by mouth daily.      fluticasone (FLONASE) 50 MCG/ACT nasal spray       lamoTRIgine (LAMICTAL) 150 MG tablet Take 0.5 tablets (75 mg total) by mouth 2 (two) times daily. 90 tablet 0    lamoTRIgine (LAMICTAL) 25 MG tablet TAKE 1 TABLET  BY MOUTH DAILY WITH THE '150MG'$  TABLET 90 tablet 0    lidocaine (XYLOCAINE) 2 % solution Use as directed 15 mLs in the mouth or throat as needed for mouth pain. 100 mL 0    lovastatin (MEVACOR) 20 MG tablet Take 20 mg by mouth at bedtime.      prazosin (MINIPRESS) 2 MG capsule TAKE 1 CAPSULE BY MOUTH AT BEDTIME 90 capsule 0    promethazine (PHENERGAN) 25 MG tablet Take 25 mg by mouth every 6 (six) hours as needed for nausea or vomiting.      Semaglutide, 2 MG/DOSE, 8 MG/3ML SOPN Inject into the skin.   08/02/2022   zaleplon (SONATA) 10 MG capsule TAKE ONE CAPSULE BY MOUTH EVERY NIGHT AT BEDTIME AS NEEDED FOR SLEEP 30 capsule 2 08/06/2022     Allergies  Allergen Reactions   Levofloxacin Other (See Comments) and Nausea Only    Lethargic, Flu-like symptoms, Hot flashes    Atorvastatin    Hydrochlorothiazide Other (See Comments)    Intolerance - dry lips   Parafon Forte Dsc [Chlorzoxazone]    Pollen Extract    Pseudoephedrine Other (See Comments)   Sudafed [Pseudoephedrine Hcl]     Heart racing     Past Medical History:  Diagnosis Date   Anxiety    Asthma    B12 deficiency  Cancer [facility])    SKIN CANCER   CHF (congestive heart failure) ([place])    Depression    Hypertension    Thyroid disease     Review of systems:  Otherwise negative.    Physical Exam  Gen: Alert, oriented. Appears stated age.  HEENT: PERRLA. Lungs: No respiratory distress CV: RRR Abd: soft, benign, no masses Ext: No edema    Planned procedures: Proceed with EGD/colonoscopy. The patient understands the nature of the planned procedure, indications, risks, alternatives and potential complications including but not limited to bleeding, infection, perforation, damage to internal organs and possible oversedation/side effects from anesthesia. The patient agrees and gives consent to proceed.  Please refer to procedure notes for findings, recommendations and patient disposition/instructions.     Kaylee Rubenstein, MD [facility]

## 2022-08-08 NOTE — Anesthesia Procedure Notes (Addendum)
Procedure Name: Intubation Date/Time: 08/08/2022 9:53 AM  Performed by: Kelton Pillar, CRNAPre-anesthesia Checklist: Patient identified, Emergency Drugs available, Suction available and Patient being monitored Patient Re-evaluated:Patient Re-evaluated prior to induction Oxygen Delivery Method: Circle system utilized Preoxygenation: Pre-oxygenation with 100% oxygen Induction Type: IV induction Ventilation: Mask ventilation without difficulty Laryngoscope Size: McGraph and 3 Grade View: Grade I Tube type: Oral Tube size: 6.5 mm Number of attempts: 1 Airway Equipment and Method: Stylet and Oral airway Placement Confirmation: ETT inserted through vocal cords under direct vision, positive ETCO2, breath sounds checked- equal and bilateral and CO2 detector Secured at: 21 cm Tube secured with: Tape Dental Injury: Teeth and Oropharynx as per pre-operative assessment  Comments: Modified RSI, one gentle MV breath given

## 2022-08-08 NOTE — Anesthesia Preprocedure Evaluation (Signed)
Anesthesia Evaluation  Patient identified by MRN, date of birth, ID band Patient awake    Reviewed: Allergy & Precautions, NPO status , Patient's Chart, lab work & pertinent test results  History of Anesthesia Complications Negative for: history of anesthetic complications  Airway Mallampati: II  TM Distance: >3 FB Neck ROM: Full    Dental no notable dental hx. (+) Teeth Intact   Pulmonary asthma , neg sleep apnea, neg COPD, Patient abstained from smoking.Not current smoker   Pulmonary exam normal breath sounds clear to auscultation       Cardiovascular Exercise Tolerance: Good METShypertension, (-) CAD, (-) Past MI and (-) CHF (-) dysrhythmias  Rhythm:Regular Rate:Normal - Systolic murmurs    Neuro/Psych  PSYCHIATRIC DISORDERS Anxiety Depression Bipolar Disorder   negative neurological ROS     GI/Hepatic ,neg GERD  ,,(+)     (-) substance abuse    Endo/Other  neg diabetes  Patient took ozempic 6 days ago  Renal/GU CRFRenal disease     Musculoskeletal   Abdominal   Peds  Hematology   Anesthesia Other Findings Past Medical History: No date: Anxiety No date: Asthma No date: B12 deficiency No date: Cancer [facility])     Comment:  SKIN CANCER No date: CHF (congestive heart failure) (HCC) No date: Depression No date: Hypertension No date: Thyroid disease  Reproductive/Obstetrics                              Anesthesia Physical Anesthesia Plan  ASA: 2  Anesthesia Plan: General   Post-op Pain Management:    Induction: Intravenous  PONV Risk Score and Plan: 2 and Ondansetron and Dexamethasone  Airway Management Planned: Oral ETT and Video Laryngoscope Planned  Additional Equipment: None  Intra-op Plan:   Post-operative Plan: Extubation in OR  Informed Consent: I have reviewed the patients History and Physical, chart, labs and discussed the procedure including the risks,  benefits and alternatives for the proposed anesthesia with the patient or authorized representative who has indicated his/her understanding and acceptance.     Dental advisory given  Plan Discussed with: CRNA and Surgeon  Anesthesia Plan Comments: (Discussed risks of anesthesia with patient, including PONV, sore throat, lip/dental/eye damage. Rare risks discussed as well, such as cardiorespiratory and neurological sequelae, and allergic reactions. Discussed the role of CRNA in patient's perioperative care. Patient understands. Patient currently takes a GLP-1 agonist medication. Recent guidelines from [employer] recommend for cessation of weekly injectable medication for one week prior to elective procedure, and of daily medication for one day prior to elective procedure. If these guidelines have not been adhered to, a risk/benefit discussion should be had, and full stomach precautions should be assumed. I spoke with the patient and proceduralist about the r/b/a of proceeding with elective surgeries in these instances, and both understand and accept the risks of proceeding with full stomach precautions. Patient is 6 days out, and has been nauseated all last night. )         Anesthesia Quick Evaluation

## 2022-08-08 NOTE — Op Note (Signed)
[facility] Patient Name: Kaylee Gordon Procedure Date: 08/08/2022 9:12 AM MRN: [id number] Account #: [id number] Date of Birth: 1960-10-03 Admit Type: Outpatient Age: 62 Room: [facility] ENDO ROOM 1 Gender: Female Note Status: Finalized Instrument Name: Jasper Riling [id number] Procedure:             Colonoscopy Indications:           Surveillance: Personal history of adenomatous polyps                         on last colonoscopy > 3 years ago Providers:             Andrey Farmer MD, MD Medicines:             Monitored Anesthesia Care Complications:         No immediate complications. Estimated blood loss:                         Minimal. Procedure:             Pre-Anesthesia Assessment:                        - Prior to the procedure, a History and Physical was                         performed, and patient medications and allergies were                         reviewed. The patient is competent. The risks and                         benefits of the procedure and the sedation options and                         risks were discussed with the patient. All questions                         were answered and informed consent was obtained.                         Patient identification and proposed procedure were                         verified by the physician, the nurse, the                         anesthesiologist, the anesthetist and the technician                         in the endoscopy suite. Mental Status Examination:                         alert and oriented. Airway Examination: normal                         oropharyngeal airway and neck mobility. Respiratory                         Examination: clear to auscultation. CV Examination:  normal. Prophylactic Antibiotics: The patient does not                         require prophylactic antibiotics. Prior                         Anticoagulants: The patient has taken no  anticoagulant                         or antiplatelet agents. ASA Grade Assessment: II - A                         patient with mild systemic disease. After reviewing                         the risks and benefits, the patient was deemed in                         satisfactory condition to undergo the procedure. The                         anesthesia plan was to use general anesthesia.                         Immediately prior to administration of medications,                         the patient was re-assessed for adequacy to receive                         sedatives. The heart rate, respiratory rate, oxygen                         saturations, blood pressure, adequacy of pulmonary                         ventilation, and response to care were monitored                         throughout the procedure. The physical status of the                         patient was re-assessed after the procedure.                        After obtaining informed consent, the colonoscope was                         passed under direct vision. Throughout the procedure,                         the patient's blood pressure, pulse, and oxygen                         saturations were monitored continuously. The                         Colonoscope was introduced through the anus and  advanced to the the cecum, identified by appendiceal                         orifice and ileocecal valve. The colonoscopy was                         performed without difficulty. The patient tolerated                         the procedure well. The quality of the bowel                         preparation was adequate to identify polyps. Findings:      The perianal and digital rectal examinations were normal.      A 4 mm polyp was found in the distal sigmoid colon. The polyp was       sessile. The polyp was removed with a cold snare. Resection and       retrieval were complete. Estimated blood loss was  minimal.      Internal hemorrhoids were found during retroflexion. The hemorrhoids       were Grade II (internal hemorrhoids that prolapse but reduce       spontaneously).      The exam was otherwise without abnormality on direct and retroflexion       views. Impression:            - One 4 mm polyp in the distal sigmoid colon, removed                         with a cold snare. Resected and retrieved.                        - Internal hemorrhoids.                        - The examination was otherwise normal on direct and                         retroflexion views. Recommendation:        - Discharge patient to home.                        - Resume previous diet.                        - Continue present medications.                        - Await pathology results.                        - Repeat colonoscopy for surveillance based on                         pathology results.                        - Return to referring physician as previously                         scheduled. Procedure Code(s):     ---  Professional ---                        [id number], Colonoscopy, flexible; with removal of                         tumor(s), polyp(s), or other lesion(s) by snare                         technique Diagnosis Code(s):     --- Professional ---                        Z86.010, Personal history of colonic polyps                        D12.5, Benign neoplasm of sigmoid colon                        K64.1, Second degree hemorrhoids CPT copyright 2022 American Medical Association. All rights reserved. The codes documented in this report are preliminary and upon coder review may  be revised to meet current compliance requirements. Andrey Farmer MD, MD 08/08/2022 10:33:07 AM Number of Addenda: 0 Note Initiated On: 08/08/2022 9:12 AM Scope Withdrawal Time: 0 hours 11 minutes 22 seconds  Total Procedure Duration: 0 hours 20 minutes 14 seconds  Estimated Blood Loss:  Estimated blood loss was  minimal.      [facility]

## 2022-08-08 NOTE — Interval H&P Note (Signed)
History and Physical Interval Note:  08/08/2022 9:48 AM  Kaylee Gordon  has presented today for surgery, with the diagnosis of HISTORY OF ADENOMATOUS POLYP OF [place].  The various methods of treatment have been discussed with the patient and family. After consideration of risks, benefits and other options for treatment, the patient has consented to  Procedure(s): COLONOSCOPY WITH PROPOFOL (N/A) ESOPHAGOGASTRODUODENOSCOPY (EGD) WITH PROPOFOL (N/A) as a surgical intervention.  The patient's history has been reviewed, patient examined, no change in status, stable for surgery.  I have reviewed the patient's chart and labs.  Questions were answered to the patient's satisfaction.     Lesly Rubenstein  Ok to proceed with EGD/Colonoscopy

## 2022-08-08 NOTE — Anesthesia Postprocedure Evaluation (Signed)
Anesthesia Post Note  Patient: Kaylee Gordon  Procedure(s) Performed: COLONOSCOPY WITH PROPOFOL ESOPHAGOGASTRODUODENOSCOPY (EGD) WITH PROPOFOL  Patient location during evaluation: Endoscopy Anesthesia Type: General Level of consciousness: awake and alert Pain management: pain level controlled Vital Signs Assessment: post-procedure vital signs reviewed and stable Respiratory status: spontaneous breathing, nonlabored ventilation, respiratory function stable and patient connected to nasal cannula oxygen Cardiovascular status: blood pressure returned to baseline and stable Postop Assessment: no apparent nausea or vomiting Anesthetic complications: no   No notable events documented.   Last Vitals:  Vitals:   08/08/22 1047 08/08/22 1052  BP: (!) 109/57 (!) 101/59  Pulse: 90 86  Resp: 17 13  Temp:    SpO2: 99% 99%    Last Pain:  Vitals:   08/08/22 1052  TempSrc:   PainSc: 0-No pain                 Arita Miss

## 2022-08-09 ENCOUNTER — Encounter: Payer: Self-pay | Admitting: Gastroenterology

## 2022-08-09 LAB — SURGICAL PATHOLOGY

## 2022-08-10 ENCOUNTER — Telehealth: Payer: Self-pay

## 2022-08-10 DIAGNOSIS — G4701 Insomnia due to medical condition: Secondary | ICD-10-CM

## 2022-08-10 MED ORDER — PRAZOSIN HCL 2 MG PO CAPS: 2.0000 mg | ORAL_CAPSULE | Freq: Every day | ORAL | 0 refills | Status: DC

## 2022-08-10 NOTE — Telephone Encounter (Signed)
Received fax requesting a refill for the following medication Last visit 06/27/22 Next visit 10/03/22    Disp Refills Start End    prazosin (MINIPRESS) 2 MG capsule 90 capsule 0 05/09/2022    Sig - Route: TAKE 1 CAPSULE BY MOUTH AT BEDTIME - Oral   Sent to pharmacy as: prazosin (MINIPRESS) 2 MG capsule   E-Prescribing Status: Receipt confirmed by pharmacy (05/09/2022  7:43 AM EDT)

## 2022-08-10 NOTE — Telephone Encounter (Signed)
I have sent prazosin 2 mg to pharmacy.

## 2022-08-10 NOTE — Addendum Note (Signed)
Addended byUrsula Alert on: 08/10/2022 03:00 PM   Modules accepted: Orders

## 2022-09-18 ENCOUNTER — Other Ambulatory Visit: Payer: Self-pay | Admitting: Family Medicine

## 2022-09-18 DIAGNOSIS — Z1231 Encounter for screening mammogram for malignant neoplasm of breast: Secondary | ICD-10-CM

## 2022-09-25 DIAGNOSIS — N1831 Chronic kidney disease, stage 3a: Secondary | ICD-10-CM | POA: Insufficient documentation

## 2022-09-30 ENCOUNTER — Other Ambulatory Visit: Payer: Self-pay

## 2022-09-30 DIAGNOSIS — Z5321 Procedure and treatment not carried out due to patient leaving prior to being seen by health care provider: Secondary | ICD-10-CM | POA: Diagnosis not present

## 2022-09-30 DIAGNOSIS — Z1152 Encounter for screening for COVID-19: Secondary | ICD-10-CM | POA: Insufficient documentation

## 2022-09-30 DIAGNOSIS — R112 Nausea with vomiting, unspecified: Secondary | ICD-10-CM | POA: Insufficient documentation

## 2022-09-30 LAB — CBG MONITORING, ED: Glucose-Capillary: 164 mg/dL — ABNORMAL HIGH (ref 70–99)

## 2022-09-30 LAB — COMPREHENSIVE METABOLIC PANEL
ALT: 19 U/L (ref 0–44)
AST: 30 U/L (ref 15–41)
Albumin: 4.8 g/dL (ref 3.5–5.0)
Alkaline Phosphatase: 83 U/L (ref 38–126)
Anion gap: 11 (ref 5–15)
BUN: 21 mg/dL (ref 8–23)
CO2: 19 mmol/L — ABNORMAL LOW (ref 22–32)
Calcium: 9.4 mg/dL (ref 8.9–10.3)
Chloride: 106 mmol/L (ref 98–111)
Creatinine, Ser: 1.02 mg/dL — ABNORMAL HIGH (ref 0.44–1.00)
GFR, Estimated: >60 mL/min (ref >60)
Glucose, Bld: 160 mg/dL — ABNORMAL HIGH (ref 70–99)
Potassium: 3.1 mmol/L — ABNORMAL LOW (ref 3.5–5.1)
Sodium: 136 mmol/L (ref 135–145)
Total Bilirubin: 1 mg/dL (ref 0.3–1.2)
Total Protein: 8.8 g/dL — ABNORMAL HIGH (ref 6.5–8.1)

## 2022-09-30 LAB — MAGNESIUM: Magnesium: 2 mg/dL (ref 1.7–2.4)

## 2022-09-30 LAB — CBC
HCT: 40.3 % (ref 36.0–46.0)
Hemoglobin: 13.3 g/dL (ref 12.0–15.0)
MCH: 28.4 pg (ref 26.0–34.0)
MCHC: 33 g/dL (ref 30.0–36.0)
MCV: 85.9 fL (ref 80.0–100.0)
Platelets: 403 10*3/uL — ABNORMAL HIGH (ref 150–400)
RBC: 4.69 MIL/uL (ref 3.87–5.11)
RDW: 14.3 % (ref 11.5–15.5)
WBC: 13.6 10*3/uL — ABNORMAL HIGH (ref 4.0–10.5)
nRBC: 0 % (ref 0.0–0.2)

## 2022-09-30 LAB — RESP PANEL BY RT-PCR (RSV, FLU A&B, COVID)  RVPGX2
Influenza A by PCR: NEGATIVE
Influenza B by PCR: NEGATIVE
Resp Syncytial Virus by PCR: NEGATIVE
SARS Coronavirus 2 by RT PCR: NEGATIVE

## 2022-09-30 LAB — LIPASE, BLOOD: Lipase: 37 U/L (ref 11–51)

## 2022-09-30 NOTE — ED Triage Notes (Addendum)
Patient brought in by EMS. Patient has been taking ozempic at home however insurance stopped covering the medication. Patient began taking semaglutide (ordered online from a [employer]) and has had nausea and vomiting ever since. Patient took 80 Units at 1800.

## 2022-10-01 ENCOUNTER — Emergency Department
Admission: EM | Admit: 2022-10-01 | Discharge: 2022-10-01 | Discharge status: Against Medical Advice | Payer: Medicare PPO | Attending: Emergency Medicine | Admitting: Emergency Medicine

## 2022-10-01 LAB — URINALYSIS, ROUTINE W REFLEX MICROSCOPIC
Bacteria, UA: NONE SEEN
Bilirubin Urine: NEGATIVE
Glucose, UA: NEGATIVE mg/dL
Hgb urine dipstick: NEGATIVE
Ketones, ur: 20 mg/dL — AB
Leukocytes,Ua: NEGATIVE
Nitrite: NEGATIVE
Protein, ur: 100 mg/dL — AB
Specific Gravity, Urine: 1.029 (ref 1.005–1.030)
Squamous Epithelial / HPF: NONE SEEN /HPF (ref 0–5)
pH: 7 (ref 5.0–8.0)

## 2022-10-02 ENCOUNTER — Telehealth: Payer: Self-pay | Admitting: Psychiatry

## 2022-10-02 DIAGNOSIS — F411 Generalized anxiety disorder: Secondary | ICD-10-CM

## 2022-10-02 DIAGNOSIS — G4701 Insomnia due to medical condition: Secondary | ICD-10-CM

## 2022-10-02 NOTE — Telephone Encounter (Signed)
Patient called to reschedule appointment due to sickness. Scheduled to 11-21-22 but needs a refill on Zaleplon 10 mg, klonopin 0.5 mg, and will call for others if needed. Please send to pharmacy, Janine Limbo

## 2022-10-03 ENCOUNTER — Ambulatory Visit: Payer: Medicare PPO | Admitting: Psychiatry

## 2022-10-03 MED ORDER — ZALEPLON 10 MG PO CAPS: ORAL_CAPSULE | ORAL | 1 refills | Status: DC

## 2022-10-03 MED ORDER — CLONAZEPAM 0.5 MG PO TABS: 0.2500 mg | ORAL_TABLET | Freq: Every day | ORAL | 1 refills | Status: DC | PRN

## 2022-10-03 NOTE — Telephone Encounter (Signed)
Called to make aware no answer.

## 2022-10-03 NOTE — Telephone Encounter (Signed)
I have sent Klonopin, zaleplon to pharmacy.

## 2022-10-11 ENCOUNTER — Ambulatory Visit
Admission: RE | Admit: 2022-10-11 | Discharge: 2022-10-11 | Disposition: A | Discharge status: Home / Self Care | Payer: Medicare PPO | Source: Ambulatory Visit | Attending: Family Medicine | Admitting: Family Medicine

## 2022-10-11 DIAGNOSIS — Z1231 Encounter for screening mammogram for malignant neoplasm of breast: Secondary | ICD-10-CM | POA: Diagnosis not present

## 2022-10-18 ENCOUNTER — Telehealth: Payer: Self-pay | Admitting: Psychiatry

## 2022-10-18 ENCOUNTER — Other Ambulatory Visit: Payer: Self-pay | Admitting: Psychiatry

## 2022-10-18 DIAGNOSIS — F3176 Bipolar disorder, in full remission, most recent episode depressed: Secondary | ICD-10-CM

## 2022-10-18 DIAGNOSIS — F411 Generalized anxiety disorder: Secondary | ICD-10-CM

## 2022-10-18 MED ORDER — LAMOTRIGINE 25 MG PO TABS: ORAL_TABLET | ORAL | 0 refills | Status: DC

## 2022-10-18 NOTE — Telephone Encounter (Signed)
I have sent lamotrigine 25 mg to pharmacy.

## 2022-10-18 NOTE — Telephone Encounter (Signed)
Received fax requesting a refill on the lamictal 25mg .  Pt last seen on 06-27-22 next appt 11-21-22    Disp Refills Start End   lamoTRIgine (LAMICTAL) 25 MG tablet 90 tablet 0 07/24/2022    Sig: TAKE 1 TABLET BY MOUTH DAILY WITH THE 150MG  TABLET   Sent to pharmacy as: lamoTRIgine (LAMICTAL) 25 MG tablet   E-Prescribing Status: Receipt confirmed by pharmacy (07/24/2022  8:32 AM EST)

## 2022-10-19 ENCOUNTER — Ambulatory Visit: Payer: Medicare PPO | Admitting: Dermatology

## 2022-11-03 ENCOUNTER — Other Ambulatory Visit: Payer: Self-pay | Admitting: Endocrinology

## 2022-11-05 ENCOUNTER — Other Ambulatory Visit: Payer: Self-pay | Admitting: Psychiatry

## 2022-11-05 DIAGNOSIS — G4701 Insomnia due to medical condition: Secondary | ICD-10-CM

## 2022-11-08 ENCOUNTER — Other Ambulatory Visit: Payer: Self-pay | Admitting: Gastroenterology

## 2022-11-08 DIAGNOSIS — R053 Chronic cough: Secondary | ICD-10-CM

## 2022-11-08 DIAGNOSIS — R1310 Dysphagia, unspecified: Secondary | ICD-10-CM

## 2022-11-10 ENCOUNTER — Other Ambulatory Visit: Payer: Self-pay | Admitting: Gastroenterology

## 2022-11-10 DIAGNOSIS — K219 Gastro-esophageal reflux disease without esophagitis: Secondary | ICD-10-CM

## 2022-11-10 DIAGNOSIS — R053 Chronic cough: Secondary | ICD-10-CM

## 2022-11-16 ENCOUNTER — Ambulatory Visit
Admission: RE | Admit: 2022-11-16 | Discharge: 2022-11-16 | Disposition: A | Discharge status: Home / Self Care | Payer: Medicare PPO | Source: Ambulatory Visit | Attending: Gastroenterology | Admitting: Gastroenterology

## 2022-11-16 DIAGNOSIS — R053 Chronic cough: Secondary | ICD-10-CM

## 2022-11-16 DIAGNOSIS — K219 Gastro-esophageal reflux disease without esophagitis: Secondary | ICD-10-CM

## 2022-11-16 MED ORDER — IOPAMIDOL (ISOVUE-300) INJECTION 61%
75.0000 mL | Freq: Once | INTRAVENOUS | Status: AC | PRN
  Administered 2022-11-16: 75 mL via INTRAVENOUS

## 2022-11-17 ENCOUNTER — Inpatient Hospital Stay: Admission: RE | Admit: 2022-11-17 | Payer: Medicare PPO | Source: Ambulatory Visit

## 2022-11-21 ENCOUNTER — Ambulatory Visit (INDEPENDENT_AMBULATORY_CARE_PROVIDER_SITE_OTHER): Payer: Medicare PPO | Admitting: Psychiatry

## 2022-11-21 ENCOUNTER — Encounter: Payer: Self-pay | Admitting: Psychiatry

## 2022-11-21 VITALS — BP 136/78 | HR 80 | Temp 98.7°F | Ht 64.5 in | Wt 154.2 lb

## 2022-11-21 DIAGNOSIS — F411 Generalized anxiety disorder: Secondary | ICD-10-CM

## 2022-11-21 DIAGNOSIS — G4701 Insomnia due to medical condition: Secondary | ICD-10-CM

## 2022-11-21 DIAGNOSIS — F3132 Bipolar disorder, current episode depressed, moderate: Secondary | ICD-10-CM

## 2022-11-21 MED ORDER — QUVIVIQ 25 MG PO TABS
25.0000 mg | ORAL_TABLET | Freq: Every day | ORAL | 1 refills | Status: DC
Start: 2022-11-21 — End: 2022-11-24

## 2022-11-21 MED ORDER — CLONAZEPAM 0.5 MG PO TABS
0.2500 mg | ORAL_TABLET | Freq: Every day | ORAL | 1 refills | Status: DC | PRN
Start: 2022-11-21 — End: 2023-02-15

## 2022-11-21 MED ORDER — ESCITALOPRAM OXALATE 10 MG PO TABS
15.0000 mg | ORAL_TABLET | Freq: Every day | ORAL | 0 refills | Status: DC
Start: 2022-11-21 — End: 2023-02-15

## 2022-11-21 NOTE — Patient Instructions (Signed)
Daridorexant Tablets What is this medication? DARIDOREXANT (DAR i doe REX ant) treats insomnia. It helps you go to sleep faster and stay asleep through the night. This medicine may be used for other purposes; ask your health care provider or pharmacist if you have questions. COMMON BRAND NAME(S): QUVIVIQ What should I tell my care team before I take this medication? They need to know if you have any of these conditions: Depression Frequently drink alcohol History of a sudden onset of muscle weakness (cataplexy) History of falling asleep often at unexpected times (narcolepsy) History of substance use disorder Liver disease Lung or breathing disease Sleep apnea Sleep-walking, driving, eating or other activity while not fully awake after taking a sleep medication Suicidal thoughts, plans, or attempt by you or a family member An unusual or allergic reaction to daridorexant, other medications, foods, dyes, or preservatives Pregnant or trying to get pregnant Breast-feeding How should I use this medication? Take this medication by mouth with water 30 minutes before going to bed. Take it as directed on the prescription label. It is better to take this medication on an empty stomach. A special MedGuide will be given to you by the pharmacist with each prescription and refill. Be sure to read this information carefully each time. Talk to your care team about the use of this medication in children. Special care may be needed. Overdosage: If you think you have taken too much of this medicine contact a poison control center or emergency room at once. NOTE: This medicine is only for you. Do not share this medicine with others. What if I miss a dose? This does not apply. This medication should only be taken as directed before going to sleep. Do not take double or extra doses. What may interact with this medication? Alcohol Benzodiazepines, such as alprazolam, diazepam, or lorazepam Bosentan Certain  antibiotics, such as erythromycin or clarithromycin Certain antihistamines Certain antivirals for HIV or hepatitis Certain medications for depression, anxiety, or mental health conditions Certain medications for fungal infections, such as itraconazole, ketoconazole, posaconazole, voriconazole Certain medications for seizures, such as carbamazepine, phenytoin, phenobarbital Diltiazem Grapefruit juice Medications that cause drowsiness before a procedure, such as propofol Medications that relax muscles Opioids for pain or cough Other medications that help you fall asleep Phenothiazines, such as chlorpromazine, prochlorperazine, thioridazine Rifampin St. John's wort Verapamil This list may not describe all possible interactions. Give your health care provider a list of all the medicines, herbs, non-prescription drugs, or dietary supplements you use. Also tell them if you smoke, drink alcohol, or use illegal drugs. Some items may interact with your medicine. What should I watch for while using this medication? Visit your care team for regular checks on your progress. Keep a regular sleep schedule by going to bed at about the same time each night. Avoid caffeine-containing drinks in the evening hours. Talk to your care team if your insomnia worsens or is not better within 7 to 10 days. You may do unusual sleep behaviors or activities you do not remember the day after taking this medication. Activities include driving, making or eating food, talking on the phone, sexual activity, or sleep walking. Stop taking this medication and call your care team right away if you find out you have done activities like this. Plan to go to bed and stay in bed for a full night (7 to 8 hours) after you take this medication. You may still be drowsy the morning after taking this medication. This medication may affect your coordination,   reaction time, or judgment. Do not drive or operate machinery until you know how this  medication affects you. Sit up or stand slowly to reduce the risk of dizzy or fainting spells. Watch for new or worsening thoughts of suicide or depression. This includes sudden changes in mood, behaviors, or thoughts. These changes can happen at any time but are more common in the beginning of treatment or after a change in dose. Call your care team right away if you experience these thoughts or worsening depression. After you stop taking this medication, you may have trouble falling asleep. This is called rebound insomnia. This problem usually goes away on its own after 1 or 2 nights. What side effects may I notice from receiving this medication? Side effects that you should report to your care team as soon as possible: Allergic reactions or angioedema--skin rash, itching or hives, swelling of the face, eyes, lips, tongue, arms, or legs, trouble swallowing or breathing CNS depression--slow or shallow breathing, shortness of breath, feeling faint, dizziness, confusion, trouble staying awake Mood and behavior changes--anxiety, nervousness, confusion, hallucinations, irritability, hostility, thoughts of suicide or self-harm, worsening mood, feelings of depression Sudden and temporary muscle weakness Unable to move or speak for several minutes upon waking or going to sleep Unusual sleep behaviors or activities you do not remember such as driving, eating, or sexual activity Side effects that usually do not require medical attention (report these to your care team if they continue or are bothersome): Drowsiness the day after use Fatigue Headache This list may not describe all possible side effects. Call your doctor for medical advice about side effects. You may report side effects to FDA at 1-800-FDA-1088. Where should I keep my medication? Keep out of the reach of children and pets. This medication can be abused. Keep it in a safe place to protect it from theft. Do not share it with anyone. It is only  for you. Selling or giving away this medication is dangerous and against the law. Store between 20 and 25 degrees C (68 and 77 degrees F). Get rid of any unused medication after the expiration date. This medication may cause harm and death if it is taken by other adults, children, or pets. It is important to get rid of the medication as soon as you no longer need it or it is expired. You can do this in two ways: Take the medication to a medication take-back program. Check with your pharmacy or law enforcement to find a location. If you cannot return the medication, check the label or package insert to see if the medication should be thrown out in the garbage or flushed down the toilet. If you are not sure, ask your care team. If it is safe to put it in the trash, take the medication out of the container. Mix the medication with cat litter, dirt, coffee grounds, or other unwanted substance. Seal the mixture in a bag or container. Put it in the trash. NOTE: This sheet is a summary. It may not cover all possible information. If you have questions about this medicine, talk to your doctor, pharmacist, or health care provider.  2023 Elsevier/Gold Standard (2022-05-18 00:00:00)  

## 2022-11-21 NOTE — Progress Notes (Signed)
BH MD OP Progress Note  11/21/2022 4:28 PM Kaylee Gordon  MRN:  [id number]  Chief Complaint:  Chief Complaint  Patient presents with   Follow-up   Depression   Anxiety   HPI: Kaylee Gordon is a 62 year old Caucasian female who has a history of bipolar disorder, GAD, fibromyalgia, married, lives in [place] was evaluated in office today.  Patient today reports she is currently anxious, restless, on edge, worsening since the past several weeks.  She also feels depressed, has sadness, low motivation, sleep problems.  Patient reports she is currently worried about her son who struggles with depression.  Patient showed a social media post that her son made recently.  She reports she has been worried about him since then.  He is currently with a psychiatrist and a therapist and she does not know what to do to help him anymore.  Patient reports sleep likely affected since she has a lot of racing thoughts and anxiety regarding her situation.  She is currently compliant on the [place] however it does not seem to help.  She has tried and failed multiple other medications including Belsomra, Ambien, Lunesta.  Patient currently follows up with her therapist every 4 to 6 weeks or so.  Agrees to reach out to have more frequent sessions.  She follows up with Ms. Felecia Jan.  Patient denies any suicidality, homicidality or perceptual disturbances.  Patient with history of QT prolongation, continues to follow-up with cardiology.  Cardiology recommended to avoid cardiotoxic medications which can have an effect on her QT.    Patient currently compliant on medications like Lexapro, Lamictal.  Denies side effects.  Visit Diagnosis:    ICD-10-CM   1. Bipolar 1 disorder, depressed, moderate (HCC)  F31.32     2. GAD (generalized anxiety disorder)  F41.1 clonazePAM (KLONOPIN) 0.5 MG tablet    escitalopram (LEXAPRO) 10 MG tablet    3. Insomnia due to medical condition  G47.01 Daridorexant HCl  (QUVIVIQ) 25 MG TABS   Anxiety      Past Psychiatric History: I have reviewed past psychiatric history from progress note on 09/11/2018.  Past trials of medications multiple including Ambien, Lunesta, Sonata, Belsomra.  Past Medical History:  Past Medical History:  Diagnosis Date   Anxiety    Asthma    B12 deficiency    Cancer (HCC)    SKIN CANCER   CHF (congestive heart failure) (HCC)    Depression    Hypertension    Thyroid disease     Past Surgical History:  Procedure Laterality Date   ANKLE SURGERY Right 2012   APPENDECTOMY     AUGMENTATION MAMMAPLASTY Bilateral    BREAST ENHANCEMENT SURGERY     COLONOSCOPY WITH PROPOFOL N/A 02/22/2015   Procedure: COLONOSCOPY WITH PROPOFOL;  Surgeon: Elnita Maxwell, MD;  [facility] ENDOSCOPY;  Service: Endoscopy;  Laterality: N/A;   COLONOSCOPY WITH PROPOFOL N/A 08/08/2022   Procedure: COLONOSCOPY WITH PROPOFOL;  Surgeon: Regis Bill, MD;  Location: [facility] ENDOSCOPY;  Service: Endoscopy;  Laterality: N/A;   ESOPHAGOGASTRODUODENOSCOPY (EGD) WITH PROPOFOL N/A 02/22/2015   Procedure: ESOPHAGOGASTRODUODENOSCOPY (EGD) WITH PROPOFOL;  Surgeon: Elnita Maxwell, MD;  Location: [facility] ENDOSCOPY;  Service: Endoscopy;  Laterality: N/A;   ESOPHAGOGASTRODUODENOSCOPY (EGD) WITH PROPOFOL N/A 02/11/2016   Procedure: ESOPHAGOGASTRODUODENOSCOPY (EGD) WITH PROPOFOL;  Surgeon: Scot Jun, MD;  Location: [facility] ENDOSCOPY;  Service: Endoscopy;  Laterality: N/A;   ESOPHAGOGASTRODUODENOSCOPY (EGD) WITH PROPOFOL N/A 08/08/2022   Procedure: ESOPHAGOGASTRODUODENOSCOPY (EGD) WITH PROPOFOL;  Surgeon:  Regis Bill, MD;  Location: [facility] ENDOSCOPY;  Service: Endoscopy;  Laterality: N/A;   HEMORRHOID SURGERY     NASAL SINUS SURGERY     SAVORY DILATION N/A 02/22/2015   Procedure: SAVORY DILATION;  Surgeon: Elnita Maxwell, MD;  Location: [facility] ENDOSCOPY;  Service: Endoscopy;  Laterality: N/A;   SAVORY DILATION N/A 02/11/2016   Procedure: SAVORY  DILATION;  Surgeon: Scot Jun, MD;  Location: [facility] ENDOSCOPY;  Service: Endoscopy;  Laterality: N/A;   SKIN CANCER EXCISION      Family Psychiatric History: I have reviewed family psychiatric history from progress note on 09/11/2018.  Family History:  Family History  Problem Relation Age of Onset   Cancer Mother        colon cancer   Depression Mother    Anxiety disorder Mother    Heart disease Father    Cancer Father        ? neck cancer- still living.    Anxiety disorder Father    Depression Father    Hypoparathyroidism Son    Colon cancer Maternal Aunt        died in 26s.     Social History: I have reviewed social history from progress note on 09/11/2018. Social History   Socioeconomic History   Marital status: Married    Spouse name: Casimiro Needle   Number of children: Not on file   Years of education: Not on file   Highest education level: Not on file  Occupational History   Not on file  Tobacco Use   Smoking status: Never   Smokeless tobacco: Never  Vaping Use   Vaping Use: Not on file  Substance and Sexual Activity   Alcohol use: Not Currently   Drug use: No   Sexual activity: Not Currently    Partners: Male  Other Topics Concern   Not on file  Social History Narrative   Lives in [place]; taught high school- math; retd; no smoking; social alcohol.    Social Determinants of Health   Financial Resource Strain: Low Risk  (05/16/2018)   Overall Financial Resource Strain (CARDIA)    Difficulty of Paying Living Expenses: Not hard at all  Food Insecurity: No Food Insecurity (05/16/2018)   Hunger Vital Sign    Worried About Running Out of Food in the Last Year: Never true    Ran Out of Food in the Last Year: Never true  Transportation Needs: No Transportation Needs (05/16/2018)   PRAPARE - Administrator, Civil Service (Medical): No    Lack of Transportation (Non-Medical): No  Physical Activity: Unknown (05/16/2018)   Exercise Vital Sign     Days of Exercise per Week: 0 days    Minutes of Exercise per Session: Not on file  Stress: Stress Concern Present (05/16/2018)   [employer] of Occupational Health - Occupational Stress Questionnaire    Feeling of Stress : Very much  Social Connections: Unknown (05/16/2018)   Social Connection and Isolation Panel [NHANES]    Frequency of Communication with Friends and Family: Twice a week    Frequency of Social Gatherings with Friends and Family: Twice a week    Attends Religious Services: More than 4 times per year    Active Member of [employer] or Organizations: Not on file    Attends Banker Meetings: Not on file    Marital Status: Married    Allergies:  Allergies  Allergen Reactions   Levofloxacin Other (See Comments) and Nausea Only  Lethargic, Flu-like symptoms, Hot flashes    Atorvastatin    Hydrochlorothiazide Other (See Comments)    Intolerance - dry lips   Parafon Forte Dsc [Chlorzoxazone]    Pollen Extract    Pseudoephedrine Other (See Comments)   Sudafed [Pseudoephedrine Hcl]     Heart racing    Metabolic Disorder Labs: Lab Results  Component Value Date   HGBA1C 5.9 (H) 02/16/2021   Lab Results  Component Value Date   PROLACTIN 18.3 07/22/2014   Lab Results  Component Value Date   CHOL 184 02/16/2021   TRIG 179 (H) 02/16/2021   HDL 37 (L) 02/16/2021   CHOLHDL 5.0 (H) 02/16/2021   LDLCALC 115 (H) 02/16/2021   Lab Results  Component Value Date   TSH 1.19 07/18/2022   TSH 3.30 12/05/2021    Therapeutic Level Labs: No results found for: "LITHIUM" Lab Results  Component Value Date   VALPROATE 39 (L) 02/04/2019   No results found for: "CBMZ"  Current Medications: Current Outpatient Medications  Medication Sig Dispense Refill   calcitRIOL (ROCALTROL) 0.25 MCG capsule TAKE 1 CAPSULE BY MOUTH DAILY 90 capsule 1   cyclobenzaprine (FLEXERIL) 5 MG tablet Take 5 mg by mouth 3 (three) times daily as needed.     Daridorexant HCl (QUVIVIQ)  25 MG TABS Take 1 tablet (25 mg total) by mouth at bedtime. 30 tablet 1   esomeprazole (NEXIUM) 40 MG capsule Take 1 capsule by mouth daily.     fenofibrate 160 MG tablet Take 1 tablet by mouth daily.     fluticasone (FLONASE) 50 MCG/ACT nasal spray      gabapentin (NEURONTIN) 300 MG capsule Take 300 mg by mouth. 2 CAP AM, 1 CAP Q noon, 2 CAP pm,     lamoTRIgine (LAMICTAL) 150 MG tablet TAKE 1/2 TABLET BY MOUTH TWICE A DAY 90 tablet 0   lamoTRIgine (LAMICTAL) 25 MG tablet TAKE 1 TABLET BY MOUTH DAILY WITH THE 150MG  TABLET 90 tablet 0   levothyroxine (SYNTHROID) 75 MCG tablet Take 1 tablet (75 mcg total) by mouth daily. 90 tablet 3   lidocaine (XYLOCAINE) 2 % solution Use as directed 15 mLs in the mouth or throat as needed for mouth pain. 100 mL 0   lisinopril (ZESTRIL) 10 MG tablet Take 10 mg by mouth daily.     lovastatin (MEVACOR) 20 MG tablet Take 20 mg by mouth at bedtime.     prazosin (MINIPRESS) 2 MG capsule TAKE 1 CAPSULE BY MOUTH AT BEDTIME 90 capsule 0   promethazine (PHENERGAN) 25 MG tablet Take 25 mg by mouth every 6 (six) hours as needed for nausea or vomiting.     propranolol ER (INDERAL LA) 80 MG 24 hr capsule Take 80 mg by mouth daily.     Semaglutide, 2 MG/DOSE, 8 MG/3ML SOPN Inject into the skin.     azelastine (ASTELIN) 0.1 % nasal spray Place into the nose.     clonazePAM (KLONOPIN) 0.5 MG tablet Take 0.5-1 tablets (0.25-0.5 mg total) by mouth daily as needed for anxiety. 30 tablet 1   escitalopram (LEXAPRO) 10 MG tablet Take 1.5 tablets (15 mg total) by mouth daily. 135 tablet 0   No current facility-administered medications for this visit.     Musculoskeletal: Strength & Muscle Tone: within normal limits Gait & Station: normal Patient leans: N/A  Psychiatric Specialty Exam: Review of Systems  Psychiatric/Behavioral:  Positive for decreased concentration, dysphoric mood and sleep disturbance. The patient is nervous/anxious.   All other systems  reviewed and are  negative.   Blood pressure 136/78, pulse 80, temperature 98.7 F (37.1 C), temperature source Temporal, height 5' 4.5" (1.638 m), weight 154 lb 3.2 oz (69.9 kg), SpO2 97 %.Body mass index is 26.06 kg/m.  General Appearance: Casual  Eye Contact:  Fair  Speech:  Clear and Coherent  Volume:  Normal  Mood:  Anxious and Depressed  Affect:  Congruent  Thought Process:  Goal Directed and Descriptions of Associations: Intact  Orientation:  Full (Time, Place, and Person)  Thought Content: Rumination   Suicidal Thoughts:  No  Homicidal Thoughts:  No  Memory:  Immediate;   Fair Recent;   Fair Remote;   Fair  Judgement:  Fair  Insight:  Fair  Psychomotor Activity:  Normal  Concentration:  Concentration: Fair and Attention Span: Fair  Recall:  [employer] of Knowledge: Fair  Language: Fair  Akathisia:  No  Handed:  Right  AIMS (if indicated): not done  Assets:  Communication Skills Desire for Improvement Housing Intimacy Social Support  ADL's:  Intact  Cognition: WNL  Sleep:  Poor   Screenings: AIMS    Flowsheet Row Video Visit from 12/08/2021 in [facility]  AIMS Total Score 0      GAD-7    Flowsheet Row Office Visit from 11/21/2022 in [facility] Video Visit from 03/28/2022 in [facility] Video Visit from 02/06/2022 in [facility] Video Visit from 01/24/2022 in [facility] Video Visit from 12/08/2021 in [facility]  Total GAD-7 Score 14 10 7 10 4       PHQ2-9    Flowsheet Row Office Visit from 11/21/2022 in [facility] Video Visit from 06/27/2022 in [facility] Video Visit from 03/28/2022 in [facility] Video Visit from  02/06/2022 in [facility] Video Visit from 01/24/2022 in [facility]  PHQ-2 Total Score 2 0 0 0 0  PHQ-9 Total Score 11 -- -- -- --      Flowsheet Row Office Visit from 11/21/2022 in [facility] ED from 10/01/2022 in [facility] at [facility] Admission (Discharged) from 08/08/2022 in [facility]  C-SSRS RISK CATEGORY No Risk No Risk No Risk        Assessment and Plan: IVANELL MARCINKO is a 62 year old Caucasian female, married, lives in [place], has a history of bipolar disorder, GAD, fibromyalgia, IBS, history of QT prolongation on EKG was evaluated in office today.  Patient currently struggling with depression, anxiety, multiple situational stressors, will benefit from more frequent psychotherapy sessions as well as medication management-plan as noted below.  Plan Bipolar disorder type I depressed moderate-unstable Lexapro 15 mg p.o. daily Will consider increasing lamotrigine, however will continue 175 mg p.o. daily in divided dosage for now.  Will change sleep medication today and will consider going up on the lamotrigine in the future. Continue CBT with Ms. Felecia Jan, patient advised to have more frequent session.  I have coordinated care.  GAD-unstable Increase clonazepam to 0.25 mg - 0.5 mg daily as needed for severe anxiety attacks Reviewed [place] PMP AWARxE Continue Lexapro 15 mg p.o. daily.   Insomnia-unstable Prazosin 2 mg p.o. nightly Discontinue Sonata for lack of benefit Start Daridorexant 25 mg p.o.  nightly. Reviewed [place] PMP AWARxE  Patient advised to follow-up with cardiology for history of prolonged QT syndrome.  Follow-up in clinic in 4 to 6 weeks or sooner if needed.  Collaboration of Care: Collaboration of Care: Other I have coordinate care with Ms. Felecia Jan.  Patient/Guardian  was advised Release of Information must be obtained prior to any record release in order to collaborate their care with an outside provider. Patient/Guardian was advised if they have not already done so to contact the registration department to sign all necessary forms in order for [place] to release information regarding their care.   Consent: Patient/Guardian gives verbal consent for treatment and assignment of benefits for services provided during this visit. Patient/Guardian expressed understanding and agreed to proceed.   This note was generated in part or whole with voice recognition software. Voice recognition is usually quite accurate but there are transcription errors that can and very often do occur. I apologize for any typographical errors that were not detected and corrected.    Jomarie Longs, MD 11/22/2022, 11:01 AM

## 2022-11-24 ENCOUNTER — Telehealth: Payer: Self-pay

## 2022-11-24 DIAGNOSIS — G4701 Insomnia due to medical condition: Secondary | ICD-10-CM

## 2022-11-24 MED ORDER — QUVIVIQ 25 MG PO TABS
25.0000 mg | ORAL_TABLET | Freq: Every day | ORAL | 1 refills | Status: DC
Start: 2022-11-24 — End: 2022-11-27

## 2022-11-24 NOTE — Telephone Encounter (Signed)
Noted , please let me know once you hear back from the PA you submitted.

## 2022-11-24 NOTE — Telephone Encounter (Signed)
i call the [facility] pharmacy to see if they carried medication. they could have monday. they state that it would be a copay of $64. (I went online and printed forms to see if we can do a tier reduction for patient.

## 2022-11-24 NOTE — Telephone Encounter (Signed)
I have sent Daridorexant / Quviviq 25 mg to [facility] pharmacy.

## 2022-11-24 NOTE — Telephone Encounter (Signed)
called patient to let her know what the status of everthing was.

## 2022-11-24 NOTE — Telephone Encounter (Signed)
called pharmacy they state that the patient insurance is not in network with them and that they could not override it.

## 2022-11-24 NOTE — Telephone Encounter (Signed)
For that particular medication, this specialty pharmacy will help by patient assistance and other support . That is what was discussed in session and that is why it was sent out to the pharmacy . Did they call her and tell her otherwise ?

## 2022-11-24 NOTE — Telephone Encounter (Signed)
faxed and confirmed tier reduction form to [employer]. - pending.

## 2022-11-24 NOTE — Telephone Encounter (Signed)
Noted  

## 2022-11-24 NOTE — Telephone Encounter (Signed)
called [facility] to see if rx went threw. they stated that they did received the rx but that they don't care that medcation at all and they could not order.

## 2022-11-24 NOTE — Telephone Encounter (Signed)
can you send the new sleep med daridorexant to the [facility]. the other pharmacy you sent it too is not in her network and insurance would not cover at all.

## 2022-11-24 NOTE — Telephone Encounter (Signed)
pt states that they told her that they would not be able to help her. that why she wanted you to send to the new pharmacy.

## 2022-11-27 ENCOUNTER — Ambulatory Visit
Admission: RE | Admit: 2022-11-27 | Discharge: 2022-11-27 | Disposition: A | Discharge status: Home / Self Care | Payer: Medicare PPO | Source: Ambulatory Visit | Attending: Gastroenterology | Admitting: Gastroenterology

## 2022-11-27 ENCOUNTER — Telehealth: Payer: Self-pay | Admitting: Psychiatry

## 2022-11-27 DIAGNOSIS — G4701 Insomnia due to medical condition: Secondary | ICD-10-CM

## 2022-11-27 DIAGNOSIS — R053 Chronic cough: Secondary | ICD-10-CM

## 2022-11-27 DIAGNOSIS — R1310 Dysphagia, unspecified: Secondary | ICD-10-CM

## 2022-11-27 MED ORDER — QUVIVIQ 25 MG PO TABS
25.0000 mg | ORAL_TABLET | Freq: Every day | ORAL | 1 refills | Status: DC
Start: 2022-11-27 — End: 2022-12-18

## 2022-11-27 NOTE — Telephone Encounter (Signed)
received fax from [employer] that a tier exception was approved and quviviq will be a tier 2 copay. authorized until 07-17-23

## 2022-11-27 NOTE — Telephone Encounter (Signed)
Pt was notified that rx was approved and the tier exception was also approved and that [facility] was going to order medication should get there tomorrow and it is $40

## 2022-11-27 NOTE — Telephone Encounter (Signed)
I have sent Quviviq 25 mg to [place] her request.

## 2022-12-07 DIAGNOSIS — M25562 Pain in left knee: Secondary | ICD-10-CM | POA: Insufficient documentation

## 2022-12-07 DIAGNOSIS — M1712 Unilateral primary osteoarthritis, left knee: Secondary | ICD-10-CM | POA: Insufficient documentation

## 2022-12-14 ENCOUNTER — Telehealth: Payer: Self-pay

## 2022-12-14 NOTE — Telephone Encounter (Signed)
pt left a message that the sleep medication is not working she states that the one she was on before worked better than this one. do you have something else   pt was last seen on 5-7 next appt 6-25

## 2022-12-15 ENCOUNTER — Encounter (INDEPENDENT_AMBULATORY_CARE_PROVIDER_SITE_OTHER): Payer: Medicare PPO

## 2022-12-15 DIAGNOSIS — G4701 Insomnia due to medical condition: Secondary | ICD-10-CM | POA: Diagnosis not present

## 2022-12-15 NOTE — Telephone Encounter (Signed)
Attempted to contact patient to discuss medication problem.  Left a message for patient to return call.

## 2022-12-18 MED ORDER — DARIDOREXANT HCL 50 MG PO TABS
50.0000 mg | ORAL_TABLET | Freq: Every day | ORAL | 1 refills | Status: DC
Start: 2022-12-18 — End: 2023-01-24

## 2022-12-18 NOTE — Telephone Encounter (Signed)
Patient with sleep problems, will go ahead and increase daridorexant to 50 mg p.o. nightly.  Reviewed [place] PMP AWARxE. Reviewed current medications, problem list.  Reviewed current allergy list.   I have spent 5 minutes known face-to-face with patient today.

## 2022-12-19 NOTE — Telephone Encounter (Signed)
Yes I sent her a my chart message and she responded yesterday . Medication was sent to pharmacy and she was sent a message through [place] regarding that.

## 2023-01-09 ENCOUNTER — Ambulatory Visit: Payer: Medicare PPO | Admitting: Psychiatry

## 2023-01-14 ENCOUNTER — Other Ambulatory Visit: Payer: Self-pay | Admitting: Psychiatry

## 2023-01-14 DIAGNOSIS — F411 Generalized anxiety disorder: Secondary | ICD-10-CM

## 2023-01-14 DIAGNOSIS — F3176 Bipolar disorder, in full remission, most recent episode depressed: Secondary | ICD-10-CM

## 2023-01-22 ENCOUNTER — Ambulatory Visit: Payer: Medicare PPO | Admitting: Endocrinology

## 2023-01-22 VITALS — BP 97/64 | HR 69 | Ht 64.5 in | Wt 158.8 lb

## 2023-01-22 DIAGNOSIS — E038 Other specified hypothyroidism: Secondary | ICD-10-CM | POA: Diagnosis not present

## 2023-01-22 DIAGNOSIS — E200 Idiopathic hypoparathyroidism: Secondary | ICD-10-CM

## 2023-01-22 NOTE — Progress Notes (Signed)
Patient ID: Kaylee Gordon, female   DOB: 1961-06-12, 62 y.o.   MRN: [id number]    History of Present Illness:  Chief complaint: Endocrinology follow-up   Secondary hypothyroidism:  She was initially given thyroxine supplement with a borderline TSH levels in 2011 However because of complaints of  fatigue on her visit in 10/15 she had thyroid levels done and free T4 was significantly low She was empirically started on 25 g  of levothyroxine Initially she felt less tired with this; however her free T4 continue to be low and her dose had been increased progressively  She is taking 75 mcg levothyroxine when her free T4 was relatively low in 6/21 However because of high normal free T4 in 8/22 she is now taking 50 mcg TSH usually is normal  She has longstanding fatigue related to fibromyalgia No change in usual level of fatigue, last seen in 1/24 Has gained weight for other reasons  She is taking her levothyroxine daily in the morning before breakfast More recently has been on a consistent dose of 75 mcg  She takes her calcium in the evening after dinner TSH and T4 pending  Wt Readings from Last 3 Encounters:  01/22/23 158 lb 12.8 oz (72 kg)  09/30/22 150 lb (68 kg)  08/08/22 150 lb (68 kg)    Lab Results  Component Value Date   FREET4 1.08 07/18/2022   FREET4 0.68 12/05/2021   FREET4 0.93 06/06/2021   TSH 1.19 07/18/2022   TSH 3.30 12/05/2021   TSH 2.510 02/16/2021    Hypoparathyroidism    Has had hypocalcemia since she was 62 years old and presented with symptoms of cramping in her hands along with twitching of her face and eyes. She was diagnosed at [facility] as having primary idiopathic hypoparathyroidism.   Again has no symptoms of twitching/muscle cramping in her hands or feet, has no tingling or numbness in her face   She is taking calcitriol 0.25 mcg daily in the morning and 1200 mg calcium once a day in the evenings She takes her  supplements very regularly  Last calcium normal as below  Lab Results  Component Value Date   CALCIUM 9.4 09/30/2022   CALCIUM 9.1 07/18/2022   CALCIUM 9.1 01/12/2022   CALCIUM 9.1 12/05/2021   CALCIUM 9.1 06/06/2021   Lab Results  Component Value Date   K 3.1 (L) 09/30/2022      Allergies as of 01/22/2023       Reactions   Levofloxacin Other (See Comments), Nausea Only   Lethargic, Flu-like symptoms, Hot flashes    Atorvastatin    Hydrochlorothiazide Other (See Comments)   Intolerance - dry lips   Parafon Forte Dsc [chlorzoxazone]    Pollen Extract    Pseudoephedrine Other (See Comments)   Sudafed [pseudoephedrine Hcl]    Heart racing        Medication List        Accurate as of January 22, 2023  2:53 PM. If you have any questions, ask your nurse or doctor.          azelastine 0.1 % nasal spray Commonly known as: ASTELIN Place into the nose.   calcitRIOL 0.25 MCG capsule Commonly known as: ROCALTROL TAKE 1 CAPSULE BY MOUTH DAILY   clonazePAM 0.5 MG tablet Commonly known as: KlonoPIN Take 0.5-1 tablets (0.25-0.5 mg total) by mouth daily as needed for anxiety.   cyclobenzaprine 5 MG tablet Commonly known  as: FLEXERIL Take 5 mg by mouth 3 (three) times daily as needed.   Daridorexant HCl 50 MG Tabs Take 1 tablet (50 mg total) by mouth at bedtime.   escitalopram 10 MG tablet Commonly known as: Lexapro Take 1.5 tablets (15 mg total) by mouth daily.   esomeprazole 40 MG capsule Commonly known as: NEXIUM Take 1 capsule by mouth daily.   fenofibrate 160 MG tablet Take 1 tablet by mouth daily.   fluticasone 50 MCG/ACT nasal spray Commonly known as: FLONASE   gabapentin 300 MG capsule Commonly known as: NEURONTIN Take 300 mg by mouth. 2 CAP AM, 1 CAP Q noon, 2 CAP pm,   lamoTRIgine 150 MG tablet Commonly known as: LAMICTAL TAKE 1/2 TABLET BY MOUTH TWICE A DAY   lamoTRIgine 25 MG tablet Commonly known as: LAMICTAL TAKE 1 TABLET BY MOUTH DAILY    levothyroxine 75 MCG tablet Commonly known as: SYNTHROID Take 1 tablet (75 mcg total) by mouth daily.   lidocaine 2 % solution Commonly known as: XYLOCAINE Use as directed 15 mLs in the mouth or throat as needed for mouth pain.   lisinopril 10 MG tablet Commonly known as: ZESTRIL Take 10 mg by mouth daily.   lovastatin 20 MG tablet Commonly known as: MEVACOR Take 20 mg by mouth at bedtime.   prazosin 2 MG capsule Commonly known as: MINIPRESS TAKE 1 CAPSULE BY MOUTH AT BEDTIME   promethazine 25 MG tablet Commonly known as: PHENERGAN Take 25 mg by mouth every 6 (six) hours as needed for nausea or vomiting.   propranolol ER 80 MG 24 hr capsule Commonly known as: INDERAL LA Take 80 mg by mouth daily.   Semaglutide (2 MG/DOSE) 8 MG/3ML Sopn Inject into the skin.        Allergies:  Allergies  Allergen Reactions   Levofloxacin Other (See Comments) and Nausea Only    Lethargic, Flu-like symptoms, Hot flashes    Atorvastatin    Hydrochlorothiazide Other (See Comments)    Intolerance - dry lips   Parafon Forte Dsc [Chlorzoxazone]    Pollen Extract    Pseudoephedrine Other (See Comments)   Sudafed [Pseudoephedrine Hcl]     Heart racing    Past Medical History:  Diagnosis Date   Anxiety    Asthma    B12 deficiency    Cancer (HCC)    SKIN CANCER   CHF (congestive heart failure) (HCC)    Depression    Hypertension    Thyroid disease     Past Surgical History:  Procedure Laterality Date   ANKLE SURGERY Right 2012   APPENDECTOMY     AUGMENTATION MAMMAPLASTY Bilateral    BREAST ENHANCEMENT SURGERY     COLONOSCOPY WITH PROPOFOL N/A 02/22/2015   Procedure: COLONOSCOPY WITH PROPOFOL;  Surgeon: Elnita Maxwell, MD;  Location: [facility] ENDOSCOPY;  Service: Endoscopy;  Laterality: N/A;   COLONOSCOPY WITH PROPOFOL N/A 08/08/2022   Procedure: COLONOSCOPY WITH PROPOFOL;  Surgeon: Regis Bill, MD;  Location: [facility] ENDOSCOPY;  Service: Endoscopy;  Laterality: N/A;    ESOPHAGOGASTRODUODENOSCOPY (EGD) WITH PROPOFOL N/A 02/22/2015   Procedure: ESOPHAGOGASTRODUODENOSCOPY (EGD) WITH PROPOFOL;  Surgeon: Elnita Maxwell, MD;  Location: [facility] ENDOSCOPY;  Service: Endoscopy;  Laterality: N/A;   ESOPHAGOGASTRODUODENOSCOPY (EGD) WITH PROPOFOL N/A 02/11/2016   Procedure: ESOPHAGOGASTRODUODENOSCOPY (EGD) WITH PROPOFOL;  Surgeon: Scot Jun, MD;  Location: [facility] ENDOSCOPY;  Service: Endoscopy;  Laterality: N/A;   ESOPHAGOGASTRODUODENOSCOPY (EGD) WITH PROPOFOL N/A 08/08/2022   Procedure: ESOPHAGOGASTRODUODENOSCOPY (EGD) WITH PROPOFOL;  Surgeon:  Regis Bill, MD;  Location: [facility] ENDOSCOPY;  Service: Endoscopy;  Laterality: N/A;   HEMORRHOID SURGERY     NASAL SINUS SURGERY     SAVORY DILATION N/A 02/22/2015   Procedure: SAVORY DILATION;  Surgeon: Elnita Maxwell, MD;  Location: [facility] ENDOSCOPY;  Service: Endoscopy;  Laterality: N/A;   SAVORY DILATION N/A 02/11/2016   Procedure: SAVORY DILATION;  Surgeon: Scot Jun, MD;  Location: [facility] ENDOSCOPY;  Service: Endoscopy;  Laterality: N/A;   SKIN CANCER EXCISION      Family History  Problem Relation Age of Onset   Cancer Mother        colon cancer   Depression Mother    Anxiety disorder Mother    Heart disease Father    Cancer Father        ? neck cancer- still living.    Anxiety disorder Father    Depression Father    Hypoparathyroidism Son    Colon cancer Maternal Aunt        died in 89s.     Social History:  reports that she has never smoked. She has never used smokeless tobacco. She reports that she does not currently use alcohol. She reports that she does not use drugs.  Review of Systems    History of depression, is managed by psychiatrist  She takes vitamin B12 supplements, last B12 level normal  HYPERLIPIDEMIA: Previously had triglycerides of about 450, followed by PCP  Lab Results  Component Value Date   CHOL 184 02/16/2021   Lab Results  Component Value Date   HDL 37 (L)  02/16/2021   Lab Results  Component Value Date   LDLCALC 115 (H) 02/16/2021   Lab Results  Component Value Date   TRIG 179 (H) 02/16/2021   Lab Results  Component Value Date   CHOLHDL 5.0 (H) 02/16/2021   No results found for: "LDLDIRECT"  PREDIABETES:  A1c checked by PCP and was 5.6 as of 9/23  Her PCP was treating her with Ozempic for weight loss also  Lab Results  Component Value Date   HGBA1C 5.9 (H) 02/16/2021   Lab Results  Component Value Date   LDLCALC 115 (H) 02/16/2021   CREATININE 1.02 (H) 09/30/2022     EXAM:  BP 97/64 (BP Location: Left Arm, Patient Position: Sitting)   Pulse 69   Ht 5' 4.5" (1.638 m)   Wt 158 lb 12.8 oz (72 kg)   SpO2 97%   BMI 26.84 kg/m   No tremor Biceps reflexes show normal relaxation [place] sign negative  Assessment/Plan:    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

## 2023-01-23 ENCOUNTER — Telehealth: Payer: Self-pay

## 2023-01-23 DIAGNOSIS — G4701 Insomnia due to medical condition: Secondary | ICD-10-CM

## 2023-01-23 LAB — BASIC METABOLIC PANEL
BUN: 17 mg/dL (ref 6–23)
CO2: 25 mEq/L (ref 19–32)
Calcium: 9.1 mg/dL (ref 8.4–10.5)
Chloride: 104 mEq/L (ref 96–112)
Creatinine, Ser: 1.12 mg/dL (ref 0.40–1.20)
GFR: 52.38 mL/min — ABNORMAL LOW (ref >60.00)
Glucose, Bld: 74 mg/dL (ref 70–99)
Potassium: 4.1 mEq/L (ref 3.5–5.1)
Sodium: 138 mEq/L (ref 135–145)

## 2023-01-23 LAB — TSH: TSH: 1.59 u[IU]/mL (ref 0.35–5.50)

## 2023-01-23 LAB — T4, FREE: Free T4: 0.73 ng/dL (ref 0.60–1.60)

## 2023-01-23 NOTE — Telephone Encounter (Signed)
left message for patient to call office back.  

## 2023-01-23 NOTE — Telephone Encounter (Signed)
Please check with patient if she is interested in starting a trial of Temazepam or Restoril. If she is , then will have to discontinue Klonopin. We do not have a lot of other options since she has a history of cardiac /QT prolongation syndrome.

## 2023-01-23 NOTE — Telephone Encounter (Signed)
Noted  

## 2023-01-23 NOTE — Telephone Encounter (Signed)
called pharmacy he stated that there is no generic for the quiviq and that the rx will cost patient $64.

## 2023-01-23 NOTE — Telephone Encounter (Signed)
called patient again. pt states she can not keep paying over $60 for that medication and she states that it really doesn't work that well anyway. can you send in something cheaper.

## 2023-01-23 NOTE — Progress Notes (Signed)
Please call to let patient know that all the lab results are normal and no further action needed

## 2023-01-23 NOTE — Telephone Encounter (Signed)
pt had left a message that it was a issues with her sleep medication at the pharmacy.

## 2023-01-24 ENCOUNTER — Other Ambulatory Visit: Payer: Self-pay

## 2023-01-24 DIAGNOSIS — E200 Idiopathic hypoparathyroidism: Secondary | ICD-10-CM

## 2023-01-24 MED ORDER — TEMAZEPAM 15 MG PO CAPS
15.0000 mg | ORAL_CAPSULE | Freq: Every evening | ORAL | 0 refills | Status: DC | PRN
Start: 2023-01-24 — End: 2023-02-15

## 2023-01-24 MED ORDER — LEVOTHYROXINE SODIUM 75 MCG PO TABS
75.0000 ug | ORAL_TABLET | Freq: Every day | ORAL | 3 refills | Status: DC
Start: 2023-01-24 — End: 2023-05-07

## 2023-01-24 MED ORDER — CALCITRIOL 0.25 MCG PO CAPS
0.2500 ug | ORAL_CAPSULE | Freq: Every day | ORAL | 1 refills | Status: DC
Start: 2023-01-24 — End: 2023-07-25

## 2023-01-24 NOTE — Telephone Encounter (Signed)
I have sent temazepam to [facility] pharmacy.  Patient to stop clonazepam 0.5 mg tablet.

## 2023-01-24 NOTE — Telephone Encounter (Signed)
pt called back she states that is fine and to send rx to the [facility]

## 2023-02-04 ENCOUNTER — Other Ambulatory Visit: Payer: Self-pay | Admitting: Psychiatry

## 2023-02-04 DIAGNOSIS — G4701 Insomnia due to medical condition: Secondary | ICD-10-CM

## 2023-02-05 ENCOUNTER — Telehealth: Payer: Self-pay

## 2023-02-05 NOTE — Telephone Encounter (Signed)
pt is requesting something else for sleep. she states that the sleept medication is not working. pt was last seen on 5-7 next appt 8-1

## 2023-02-05 NOTE — Telephone Encounter (Signed)
Please let patient know she needs to have an appointment for further medication changes.

## 2023-02-06 NOTE — Telephone Encounter (Signed)
called patient back and gave her the info that she would need to come in for medication changes. pt does have appt on 8-1 but i asked front desk to put patient on wait list. pt states that last night she took 2 pills and it worked she was able to sleep.

## 2023-02-06 NOTE — Telephone Encounter (Signed)
Noted , please let her know to keep [place] posted and if she needs to stay on 2 pills , its OK to do so. But please advise not to increase further. Please let us know if she needs a refill.

## 2023-02-13 DIAGNOSIS — S92343A Displaced fracture of fourth metatarsal bone, unspecified foot, initial encounter for closed fracture: Secondary | ICD-10-CM | POA: Insufficient documentation

## 2023-02-13 DIAGNOSIS — R600 Localized edema: Secondary | ICD-10-CM | POA: Insufficient documentation

## 2023-02-13 DIAGNOSIS — S92323A Displaced fracture of second metatarsal bone, unspecified foot, initial encounter for closed fracture: Secondary | ICD-10-CM | POA: Insufficient documentation

## 2023-02-13 DIAGNOSIS — S92333A Displaced fracture of third metatarsal bone, unspecified foot, initial encounter for closed fracture: Secondary | ICD-10-CM | POA: Insufficient documentation

## 2023-02-15 ENCOUNTER — Encounter: Payer: Self-pay | Admitting: Psychiatry

## 2023-02-15 ENCOUNTER — Telehealth: Payer: Medicare PPO | Admitting: Psychiatry

## 2023-02-15 DIAGNOSIS — F411 Generalized anxiety disorder: Secondary | ICD-10-CM | POA: Diagnosis not present

## 2023-02-15 DIAGNOSIS — G4701 Insomnia due to medical condition: Secondary | ICD-10-CM

## 2023-02-15 DIAGNOSIS — F3132 Bipolar disorder, current episode depressed, moderate: Secondary | ICD-10-CM | POA: Diagnosis not present

## 2023-02-15 MED ORDER — LAMOTRIGINE 200 MG PO TABS
100.0000 mg | ORAL_TABLET | Freq: Two times a day (BID) | ORAL | 0 refills | Status: DC
Start: 2023-02-15 — End: 2023-05-14

## 2023-02-15 MED ORDER — ESCITALOPRAM OXALATE 10 MG PO TABS: 15.0000 mg | ORAL_TABLET | Freq: Every day | ORAL | 0 refills | Status: DC

## 2023-02-15 MED ORDER — TEMAZEPAM 30 MG PO CAPS
30.0000 mg | ORAL_CAPSULE | Freq: Every evening | ORAL | 1 refills | Status: DC | PRN
Start: 2023-02-15 — End: 2023-04-11

## 2023-02-15 NOTE — Progress Notes (Signed)
Virtual Visit via Video Note  I connected with Kaylee Gordon on 02/15/23 at 10:30 AM EDT by a video enabled telemedicine application and verified that I am speaking with the correct person using two identifiers.  Location Provider Location : [facility] Patient Location : Home  Participants: Patient , Provider   I discussed the limitations of evaluation and management by telemedicine and the availability of in person appointments. The patient expressed understanding and agreed to proceed.    I discussed the assessment and treatment plan with the patient. The patient was provided an opportunity to ask questions and all were answered. The patient agreed with the plan and demonstrated an understanding of the instructions.   The patient was advised to call back or seek an in-person evaluation if the symptoms worsen or if the condition fails to improve as anticipated.   BH MD OP Progress Note  02/16/2023 7:52 AM Kaylee Gordon  MRN:  [id number]  Chief Complaint:  Chief Complaint  Patient presents with   Follow-up   Anxiety   Depression   Medication Refill   HPI: Kaylee Gordon is a 62 year old Caucasian female who has a history of bipolar disorder, GAD, fibromyalgia, married, lives in [place] was evaluated by telemedicine today.  Patient today reports she is currently struggling with multiple situational stressors.  She tripped and fell last week while she was walking to church, she reports she has fracture of her right foot.  I have reviewed medical records in epic-patient with close fracture of the second and third fourth and fifth metatarsal right foot.  [facility] and is currently in a boot.  She has upcoming appointment with a foot specialist.  She was on pain medication every few hours for a couple of days after the fracture and she reports she was completely out of it since it made her sedated.  She is currently limiting use.  She does continue to  struggle with anxiety symptoms.  She does also have mood swings given her situational stressors.  Patient reports sleep is better when she takes 2 of the temazepam 15 mg.  She would like to go up on the temazepam.    Patient denies any suicidality, homicidality or perceptual disturbances.  Patient reports she continues to be motivated to stay in therapy.  She is compliant on medications like prazosin, Lamictal, Lexapro.  Denies side effects.  Patient reports her daughter had COVID-19 infection recently and her husband is currently sick likely has COVID-19.  That also worries her.  Patient denies any other concerns today.  Visit Diagnosis:    ICD-10-CM   1. Bipolar 1 disorder, depressed, moderate (HCC)  F31.32 lamoTRIgine (LAMICTAL) 200 MG tablet    2. GAD (generalized anxiety disorder)  F41.1 escitalopram (LEXAPRO) 10 MG tablet    3. Insomnia due to medical condition  G47.01 temazepam (RESTORIL) 30 MG capsule   anxiety, pain      Past Psychiatric History: I have reviewed past psychiatric history from progress note on 09/11/2018.  Past trials of medications multiple including Ambien, Lunesta, Sonata, Belsomra.  Past Medical History:  Past Medical History:  Diagnosis Date   Anxiety    Asthma    B12 deficiency    Cancer (HCC)    SKIN CANCER   CHF (congestive heart failure) (HCC)    Depression    Hypertension    Thyroid disease     Past Surgical History:  Procedure Laterality Date   ANKLE  SURGERY Right 2012   APPENDECTOMY     AUGMENTATION MAMMAPLASTY Bilateral    BREAST ENHANCEMENT SURGERY     COLONOSCOPY WITH PROPOFOL N/A 02/22/2015   Procedure: COLONOSCOPY WITH PROPOFOL;  Surgeon: Elnita Maxwell, MD;  Location: [facility] ENDOSCOPY;  Service: Endoscopy;  Laterality: N/A;   COLONOSCOPY WITH PROPOFOL N/A 08/08/2022   Procedure: COLONOSCOPY WITH PROPOFOL;  Surgeon: Regis Bill, MD;  Location: [facility] ENDOSCOPY;  Service: Endoscopy;  Laterality: N/A;    ESOPHAGOGASTRODUODENOSCOPY (EGD) WITH PROPOFOL N/A 02/22/2015   Procedure: ESOPHAGOGASTRODUODENOSCOPY (EGD) WITH PROPOFOL;  Surgeon: Elnita Maxwell, MD;  Location: [facility] ENDOSCOPY;  Service: Endoscopy;  Laterality: N/A;   ESOPHAGOGASTRODUODENOSCOPY (EGD) WITH PROPOFOL N/A 02/11/2016   Procedure: ESOPHAGOGASTRODUODENOSCOPY (EGD) WITH PROPOFOL;  Surgeon: Scot Jun, MD;  Location: [facility] ENDOSCOPY;  Service: Endoscopy;  Laterality: N/A;   ESOPHAGOGASTRODUODENOSCOPY (EGD) WITH PROPOFOL N/A 08/08/2022   Procedure: ESOPHAGOGASTRODUODENOSCOPY (EGD) WITH PROPOFOL;  Surgeon: Regis Bill, MD;  Location: [facility] ENDOSCOPY;  Service: Endoscopy;  Laterality: N/A;   HEMORRHOID SURGERY     NASAL SINUS SURGERY     SAVORY DILATION N/A 02/22/2015   Procedure: SAVORY DILATION;  Surgeon: Elnita Maxwell, MD;  Location: [facility] ENDOSCOPY;  Service: Endoscopy;  Laterality: N/A;   SAVORY DILATION N/A 02/11/2016   Procedure: SAVORY DILATION;  Surgeon: Scot Jun, MD;  Location: [facility] ENDOSCOPY;  Service: Endoscopy;  Laterality: N/A;   SKIN CANCER EXCISION      Family Psychiatric History: Reviewed family psychiatric history from progress note on 09/11/2018.  Family History:  Family History  Problem Relation Age of Onset   Cancer Mother        colon cancer   Depression Mother    Anxiety disorder Mother    Heart disease Father    Cancer Father        ? neck cancer- still living.    Anxiety disorder Father    Depression Father    Hypoparathyroidism Son    Colon cancer Maternal Aunt        died in 31s.     Social History: Reviewed social history from progress note on 09/11/2018.   Social History   Socioeconomic History   Marital status: Married    Spouse name: Casimiro Needle   Number of children: Not on file   Years of education: Not on file   Highest education level: Not on file  Occupational History   Not on file  Tobacco Use   Smoking status: Never   Smokeless tobacco: Never  Vaping Use    Vaping status: Not on file  Substance and Sexual Activity   Alcohol use: Not Currently   Drug use: No   Sexual activity: Not Currently    Partners: Male  Other Topics Concern   Not on file  Social History Narrative   Lives in [place]; taught high school- math; retd; no smoking; social alcohol.    Social Determinants of Health   Financial Resource Strain: Low Risk  (05/16/2018)   Overall Financial Resource Strain (CARDIA)    Difficulty of Paying Living Expenses: Not hard at all  Food Insecurity: No Food Insecurity (05/16/2018)   Hunger Vital Sign    Worried About Running Out of Food in the Last Year: Never true    Ran Out of Food in the Last Year: Never true  Transportation Needs: No Transportation Needs (05/16/2018)   PRAPARE - Administrator, Civil Service (Medical): No    Lack of Transportation (Non-Medical): No  Physical Activity: Unknown (05/16/2018)   Exercise Vital Sign    Days of Exercise per Week: 0 days    Minutes of Exercise per Session: Not on file  Stress: Stress Concern Present (05/16/2018)   [employer] of Occupational Health - Occupational Stress Questionnaire    Feeling of Stress : Very much  Social Connections: Unknown (05/16/2018)   Social Connection and Isolation Panel [NHANES]    Frequency of Communication with Friends and Family: Twice a week    Frequency of Social Gatherings with Friends and Family: Twice a week    Attends Religious Services: More than 4 times per year    Active Member of [employer] or Organizations: Not on file    Attends Banker Meetings: Not on file    Marital Status: Married    Allergies:  Allergies  Allergen Reactions   Levofloxacin Other (See Comments) and Nausea Only    Lethargic, Flu-like symptoms, Hot flashes    Atorvastatin    Hydrochlorothiazide Other (See Comments)    Intolerance - dry lips   Parafon Forte Dsc [Chlorzoxazone]    Pollen Extract    Pseudoephedrine Other (See Comments)    Sudafed [Pseudoephedrine Hcl]     Heart racing    Metabolic Disorder Labs: Lab Results  Component Value Date   HGBA1C 5.9 (H) 02/16/2021   Lab Results  Component Value Date   PROLACTIN 18.3 07/22/2014   Lab Results  Component Value Date   CHOL 184 02/16/2021   TRIG 179 (H) 02/16/2021   HDL 37 (L) 02/16/2021   CHOLHDL 5.0 (H) 02/16/2021   LDLCALC 115 (H) 02/16/2021   Lab Results  Component Value Date   TSH 1.59 01/22/2023   TSH 1.19 07/18/2022    Therapeutic Level Labs: No results found for: "LITHIUM" Lab Results  Component Value Date   VALPROATE 39 (L) 02/04/2019   No results found for: "CBMZ"  Current Medications: Current Outpatient Medications  Medication Sig Dispense Refill   calcitRIOL (ROCALTROL) 0.25 MCG capsule Take 1 capsule (0.25 mcg total) by mouth daily. 90 capsule 1   fenofibrate 160 MG tablet Take 1 tablet by mouth daily.     fluticasone (FLONASE) 50 MCG/ACT nasal spray      gabapentin (NEURONTIN) 300 MG capsule Take 300 mg by mouth. 2 CAP AM, 1 CAP Q noon, 2 CAP pm,     lamoTRIgine (LAMICTAL) 200 MG tablet Take 0.5 tablets (100 mg total) by mouth 2 (two) times daily. 90 tablet 0   levothyroxine (SYNTHROID) 75 MCG tablet Take 1 tablet (75 mcg total) by mouth daily. 90 tablet 3   lidocaine (XYLOCAINE) 2 % solution Use as directed 15 mLs in the mouth or throat as needed for mouth pain. 100 mL 0   lisinopril (ZESTRIL) 10 MG tablet Take 10 mg by mouth daily.     lovastatin (MEVACOR) 20 MG tablet Take 20 mg by mouth at bedtime.     prazosin (MINIPRESS) 2 MG capsule TAKE 1 CAPSULE BY MOUTH AT BEDTIME 90 capsule 0   promethazine (PHENERGAN) 25 MG tablet Take 25 mg by mouth every 6 (six) hours as needed for nausea or vomiting.     propranolol ER (INDERAL LA) 80 MG 24 hr capsule Take 80 mg by mouth daily.     temazepam (RESTORIL) 30 MG capsule Take 1 capsule (30 mg total) by mouth at bedtime as needed for sleep. 30 capsule 1   escitalopram (LEXAPRO) 10 MG tablet  Take 1.5 tablets (15 mg  total) by mouth daily. 135 tablet 0   No current facility-administered medications for this visit.     Musculoskeletal: Strength & Muscle Tone:  UTA Gait & Station:  Seated Patient leans: N/A  Psychiatric Specialty Exam: Review of Systems  Psychiatric/Behavioral:  The patient is nervous/anxious.     There were no vitals taken for this visit.There is no height or weight on file to calculate BMI.  General Appearance: Casual  Eye Contact:  Fair  Speech:  Clear and Coherent  Volume:  Normal  Mood:  Anxious  Affect:  Congruent  Thought Process:  Goal Directed and Descriptions of Associations: Intact  Orientation:  Full (Time, Place, and Person)  Thought Content: Logical   Suicidal Thoughts:  No  Homicidal Thoughts:  No  Memory:  Immediate;   Fair Recent;   Fair Remote;   Fair  Judgement:  Fair  Insight:  Fair  Psychomotor Activity:  Normal  Concentration:  Concentration: Fair and Attention Span: Fair  Recall:  [employer] of Knowledge: Fair  Language: Fair  Akathisia:  No  Handed:  Right  AIMS (if indicated): not done  Assets:  Communication Skills Desire for Improvement Housing Social Support  ADL's:  Intact  Cognition: WNL  Sleep:  Fair   Screenings: AIMS    Flowsheet Row Video Visit from 12/08/2021 in [facility]  AIMS Total Score 0      GAD-7    Flowsheet Row Office Visit from 11/21/2022 in [facility] Video Visit from 03/28/2022 in [facility] Video Visit from 02/06/2022 in [facility] Video Visit from 01/24/2022 in [facility] Video Visit from 12/08/2021 in [facility]  Total GAD-7 Score 14 10 7 10 4       PHQ2-9    Flowsheet Row Office Visit from 11/21/2022 in [facility] Video Visit from 06/27/2022 in [facility] Video Visit from 03/28/2022 in [facility] Video Visit from 02/06/2022 in [facility] Video Visit from 01/24/2022 in [facility]  PHQ-2 Total Score 2 0 0 0 0  PHQ-9 Total Score 11 -- -- -- --      Flowsheet Row Video Visit from 02/15/2023 in [facility] Office Visit from 11/21/2022 in [facility] ED from 10/01/2022 in [facility]  C-SSRS RISK CATEGORY No Risk No Risk No Risk        Assessment and Plan: Kaylee Gordon is a 62 year old Caucasian female, married, lives in [place], has a history of bipolar disorder, GAD, fibromyalgia, IBS, history of QT prolongation on EKG currently under the care of cardiology, recent closed fracture of right foot, as well as other situational stresses including family being sick with COVID-19 infection, currently struggling with mood, sleep, will benefit from the following plan.  Plan Bipolar disorder type I depressed moderate-unstable Continue Lexapro 15 mg p.o. daily Increase lamotrigine to 200 mg p.o. daily in divided dosage Continue CBT with Ms. Felecia Jan.  GAD-improving Continue Lexapro 15 mg p.o. daily Lamotrigine increased as noted above. Given situational stressors she will benefit from more frequent psychotherapy sessions. Discontinue clonazepam since patient is currently on temazepam, I do not recommend combining these 2 medications.  Insomnia-unstable Increase temazepam to  30 mg p.o. nightly Prazosin 2 mg p.o. nightly Reviewed [place] PMP AWARxE   I have reviewed notes per PA Ferri dated 02/11/2023-patient with closed fracture of the metatarsal right foot.  Collaboration of Care: Collaboration of  Care: Referral or follow-up with counselor/therapist AEB patient encouraged to continue CBT.  Patient/Guardian was advised Release of Information must be obtained prior to any record release in order to collaborate their care with an outside provider. Patient/Guardian was advised if they have not already done so to contact the registration department to sign all necessary forms in order for [place] to release information regarding their care.   Consent: Patient/Guardian gives verbal consent for treatment and assignment of benefits for services provided during this visit. Patient/Guardian expressed understanding and agreed to proceed.   Follow-up in clinic in 7 to 8 weeks or sooner in person.  This note was generated in part or whole with voice recognition software. Voice recognition is usually quite accurate but there are transcription errors that can and very often do occur. I apologize for any typographical errors that were not detected and corrected.    Jomarie Longs, MD 02/16/2023, 7:52 AM

## 2023-03-13 DIAGNOSIS — S92353A Displaced fracture of fifth metatarsal bone, unspecified foot, initial encounter for closed fracture: Secondary | ICD-10-CM | POA: Insufficient documentation

## 2023-04-03 DIAGNOSIS — S92413A Displaced fracture of proximal phalanx of unspecified great toe, initial encounter for closed fracture: Secondary | ICD-10-CM | POA: Insufficient documentation

## 2023-04-03 DIAGNOSIS — S92514A Nondisplaced fracture of proximal phalanx of right lesser toe(s), initial encounter for closed fracture: Secondary | ICD-10-CM | POA: Insufficient documentation

## 2023-04-08 ENCOUNTER — Ambulatory Visit
Admission: RE | Admit: 2023-04-08 | Discharge: 2023-04-08 | Disposition: A | Discharge status: Home / Self Care | Payer: Medicare PPO | Source: Ambulatory Visit

## 2023-04-08 VITALS — BP 122/86 | HR 100 | Temp 98.1°F | Resp 18

## 2023-04-08 DIAGNOSIS — J0100 Acute maxillary sinusitis, unspecified: Secondary | ICD-10-CM | POA: Diagnosis not present

## 2023-04-08 DIAGNOSIS — R051 Acute cough: Secondary | ICD-10-CM | POA: Diagnosis not present

## 2023-04-08 MED ORDER — IPRATROPIUM BROMIDE 0.03 % NA SOLN: 2.0000 | Freq: Two times a day (BID) | NASAL | 12 refills | Status: DC

## 2023-04-08 MED ORDER — AMOXICILLIN 875 MG PO TABS: 875.0000 mg | ORAL_TABLET | Freq: Two times a day (BID) | ORAL | 0 refills | Status: AC

## 2023-04-08 MED ORDER — BENZONATATE 100 MG PO CAPS: 100.0000 mg | ORAL_CAPSULE | Freq: Three times a day (TID) | ORAL | 0 refills | Status: DC | PRN

## 2023-04-08 NOTE — ED Provider Notes (Signed)
[name]    CSN: [id number] Arrival date & time: 04/08/23  1035      History   Chief Complaint Chief Complaint  Patient presents with   Nasal Congestion    Entered by patient    HPI Kaylee Gordon is a 62 y.o. female.  Patient presents with 5-day history of sinus congestion, sinus pressure, postnasal drip, cough, fatigue, intermittent cold chills and warm flashes.  Treating symptoms with Coricidin.  No fever, chest pain, shortness of breath, wheezing, or other symptoms.  Two negative COVID tests at home.  The history is provided by the patient and medical records.    Past Medical History:  Diagnosis Date   Anxiety    Asthma    B12 deficiency    Cancer (HCC)    SKIN CANCER   CHF (congestive heart failure) (HCC)    Depression    Hypertension    Thyroid disease     Patient Active Problem List   Diagnosis Date Noted   Medicare annual wellness visit, initial 03/27/2022   History of prolonged Q-T interval on ECG 01/24/2022   Trochanteric bursitis of left hip 01/20/2022   Bereavement 10/03/2021   Bipolar 1 disorder, depressed, moderate (HCC) 10/03/2021   Elevated blood pressure reading 06/06/2021   Prolonged Q-T interval on ECG 06/02/2021   Prolonged QT syndrome 06/01/2021   Bipolar disorder, in full remission, most recent episode depressed (HCC) 05/24/2021   Bipolar 1 disorder, depressed, partial remission (HCC) 02/10/2021   History of colonic polyps 08/16/2020   CKD (chronic kidney disease) stage 3, GFR 30-59 ml/min (HCC) 08/16/2020   Insomnia due to medical condition 06/25/2020   At risk for prolonged QT interval syndrome 06/25/2020   Skull lesion 02/24/2020   Tremor 05/08/2019   Bipolar I disorder, most recent episode depressed (HCC) 12/30/2018   B12 deficiency 11/22/2018   Low iron 11/22/2018   History of normocytic normochromic anemia 11/21/2018   Iron deficiency anemia secondary to inadequate dietary iron intake 11/21/2018   Multiple lung  nodules on CT 04/19/2018   Borderline diabetes mellitus 08/10/2017   Paroxysmal supraventricular tachycardia 09/27/2015   Benign essential HTN 06/22/2015   Combined fat and carbohydrate induced hyperlipemia 06/22/2015   Breathlessness on exertion 06/17/2015   Pure hypercholesterolemia 04/22/2015   Congestive heart failure with left ventricular systolic dysfunction (HCC) 03/24/2015   Acquired hypothyroidism 02/04/2015   Heart failure, systolic (HCC) 02/04/2015   Anxiety 12/28/2014   Allergic rhinitis 10/27/2014   Bipolar 1 disorder, depressed (HCC) 10/27/2014   Hypercholesterolemia without hypertriglyceridemia 10/27/2014   Depression, major, recurrent, moderate (HCC) 10/27/2014   Adult hypothyroidism 10/27/2014   GAD (generalized anxiety disorder) 10/27/2014   Essential (primary) hypertension 10/27/2014   Colon polyp 10/27/2014   Hypoparathyroidism (HCC) 02/14/2013    Past Surgical History:  Procedure Laterality Date   ANKLE SURGERY Right 2012   APPENDECTOMY     AUGMENTATION MAMMAPLASTY Bilateral    BREAST ENHANCEMENT SURGERY     COLONOSCOPY WITH PROPOFOL N/A 02/22/2015   Procedure: COLONOSCOPY WITH PROPOFOL;  Surgeon: Elnita Maxwell, MD;  Location: [facility] ENDOSCOPY;  Service: Endoscopy;  Laterality: N/A;   COLONOSCOPY WITH PROPOFOL N/A 08/08/2022   Procedure: COLONOSCOPY WITH PROPOFOL;  Surgeon: Regis Bill, MD;  Location: [facility] ENDOSCOPY;  Service: Endoscopy;  Laterality: N/A;   ESOPHAGOGASTRODUODENOSCOPY (EGD) WITH PROPOFOL N/A 02/22/2015   Procedure: ESOPHAGOGASTRODUODENOSCOPY (EGD) WITH PROPOFOL;  Surgeon: Elnita Maxwell, MD;  Location[facility] ENDOSCOPY;  Service: Endoscopy;  Laterality: N/A;   ESOPHAGOGASTRODUODENOSCOPY (  EGD) WITH PROPOFOL N/A 02/11/2016   Procedure: ESOPHAGOGASTRODUODENOSCOPY (EGD) WITH PROPOFOL;  Surgeon: Scot Jun, MD;  Location: [facility] ENDOSCOPY;  Service: Endoscopy;  Laterality: N/A;   ESOPHAGOGASTRODUODENOSCOPY (EGD) WITH PROPOFOL N/A  08/08/2022   Procedure: ESOPHAGOGASTRODUODENOSCOPY (EGD) WITH PROPOFOL;  Surgeon: Regis Bill, MD;  Location: [facility] ENDOSCOPY;  Service: Endoscopy;  Laterality: N/A;   HEMORRHOID SURGERY     NASAL SINUS SURGERY     SAVORY DILATION N/A 02/22/2015   Procedure: SAVORY DILATION;  Surgeon: Elnita Maxwell, MD;  Location: [facility] ENDOSCOPY;  Service: Endoscopy;  Laterality: N/A;   SAVORY DILATION N/A 02/11/2016   Procedure: SAVORY DILATION;  Surgeon: Scot Jun, MD;  Location: [facility] ENDOSCOPY;  Service: Endoscopy;  Laterality: N/A;   SKIN CANCER EXCISION      OB History   No obstetric history on file.      Home Medications    Prior to Admission medications   Medication Sig Start Date End Date Taking? Authorizing Provider  amoxicillin (AMOXIL) 875 MG tablet Take 1 tablet (875 mg total) by mouth 2 (two) times daily for 10 days. 04/08/23 04/18/23 Yes Mickie Bail, NP  benzonatate (TESSALON) 100 MG capsule Take 1 capsule (100 mg total) by mouth 3 (three) times daily as needed for cough. 04/08/23  Yes Mickie Bail, NP  ipratropium (ATROVENT) 0.03 % nasal spray Place 2 sprays into both nostrils every 12 (twelve) hours. 04/08/23  Yes Mickie Bail, NP  isosorbide mononitrate (IMDUR) 30 MG 24 hr tablet  02/06/23  Yes [provider]  calcitRIOL (ROCALTROL) 0.25 MCG capsule Take 1 capsule (0.25 mcg total) by mouth daily. 01/24/23   Reather Littler, MD  escitalopram (LEXAPRO) 10 MG tablet Take 1.5 tablets (15 mg total) by mouth daily. 02/15/23   Jomarie Longs, MD  fenofibrate 160 MG tablet Take 1 tablet by mouth daily. 12/17/20   [provider]  fluticasone Aleda Grana) 50 MCG/ACT nasal spray  12/25/12   [provider]  gabapentin (NEURONTIN) 300 MG capsule Take 300 mg by mouth. 2 CAP AM, 1 CAP Q noon, 2 CAP pm, 10/06/19   [provider]  lamoTRIgine (LAMICTAL) 200 MG tablet Take 0.5 tablets (100 mg total) by mouth 2 (two) times daily. 02/15/23   Jomarie Longs, MD   levothyroxine (SYNTHROID) 75 MCG tablet Take 1 tablet (75 mcg total) by mouth daily. 01/24/23   Reather Littler, MD  lidocaine (XYLOCAINE) 2 % solution Use as directed 15 mLs in the mouth or throat as needed for mouth pain. 07/11/22   Mickie Bail, NP  lisinopril (ZESTRIL) 10 MG tablet Take 10 mg by mouth daily. 05/26/19   [provider]  lovastatin (MEVACOR) 20 MG tablet Take 20 mg by mouth at bedtime.    [provider]  prazosin (MINIPRESS) 2 MG capsule TAKE 1 CAPSULE BY MOUTH AT BEDTIME 02/05/23   Jomarie Longs, MD  promethazine (PHENERGAN) 25 MG tablet Take 25 mg by mouth every 6 (six) hours as needed for nausea or vomiting.    [provider]  propranolol ER (INDERAL LA) 80 MG 24 hr capsule Take 80 mg by mouth daily. 10/08/22   [provider]  temazepam (RESTORIL) 30 MG capsule Take 1 capsule (30 mg total) by mouth at bedtime as needed for sleep. 02/15/23 04/16/23  Jomarie Longs, MD    Family History Family History  Problem Relation Age of Onset   Cancer Mother        colon cancer  Depression Mother    Anxiety disorder Mother    Heart disease Father    Cancer Father        ? neck cancer- still living.    Anxiety disorder Father    Depression Father    Hypoparathyroidism Son    Colon cancer Maternal Aunt        died in 52s.     Social History Social History   Tobacco Use   Smoking status: Never   Smokeless tobacco: Never  Substance Use Topics   Alcohol use: Not Currently   Drug use: No     Allergies   Levofloxacin, Atorvastatin, Hydrochlorothiazide, Parafon forte dsc [chlorzoxazone], Pollen extract, Pseudoephedrine, and Sudafed [pseudoephedrine hcl]   Review of Systems Review of Systems  Constitutional:  Positive for chills and fatigue. Negative for fever.  HENT:  Positive for congestion, ear pain, postnasal drip, rhinorrhea and sinus pressure. Negative for sore throat.   Respiratory:  Positive for cough. Negative for shortness of  breath and wheezing.   Cardiovascular:  Negative for chest pain and palpitations.     Physical Exam Triage Vital Signs ED Triage Vitals  Encounter Vitals Group     BP 04/08/23 1111 122/86     Systolic BP Percentile --      Diastolic BP Percentile --      Pulse Rate 04/08/23 1057 100     Resp 04/08/23 1057 18     Temp 04/08/23 1057 98.1 F (36.7 C)     Temp src --      SpO2 04/08/23 1057 95 %     Weight --      Height --      Head Circumference --      Peak Flow --      Pain Score 04/08/23 1104 7     Pain Loc --      Pain Education --      Exclude from Growth Chart --    No data found.  Updated Vital Signs BP 122/86   Pulse 100   Temp 98.1 F (36.7 C)   Resp 18   SpO2 95%   Visual Acuity Right Eye Distance:   Left Eye Distance:   Bilateral Distance:    Right Eye Near:   Left Eye Near:    Bilateral Near:     Physical Exam Constitutional:      General: She is not in acute distress. HENT:     Right Ear: Tympanic membrane normal.     Left Ear: Tympanic membrane normal.     Nose: Congestion and rhinorrhea present.     Mouth/Throat:     Mouth: Mucous membranes are moist.     Pharynx: Oropharynx is clear.  Cardiovascular:     Rate and Rhythm: Normal rate and regular rhythm.     Heart sounds: Normal heart sounds.  Pulmonary:     Effort: Pulmonary effort is normal. No respiratory distress.     Breath sounds: Normal breath sounds.  Skin:    General: Skin is warm and dry.  Neurological:     Mental Status: She is alert.  Psychiatric:        Mood and Affect: Mood normal.        Behavior: Behavior normal.      UC Treatments / Results  Labs (all labs ordered are listed, but only abnormal results are displayed) Labs Reviewed - No data to display  EKG   Radiology No results found.  Procedures Procedures (including  critical care time)  Medications Ordered in UC Medications - No data to display  Initial Impression / Assessment and Plan / UC Course   I have reviewed the triage vital signs and the nursing notes.  Pertinent labs & imaging results that were available during my care of the patient were reviewed by me and considered in my medical decision making (see chart for details).    Acute sinusitis, cough.  Patient has been symptomatic for 5 days and is not improving with OTC treatment.  Afebrile and vital signs are stable.  Lungs are clear, O2 sat 95% on room air.  Treating with amoxicillin, ipratropium nasal spray, Tessalon Perles.  Instructed her to follow-up with her PCP tomorrow.  Education provided on sinus infection.  She agrees to plan of care.  Final Clinical Impressions(s) / UC Diagnoses   Final diagnoses:  Acute non-recurrent maxillary sinusitis  Acute cough     Discharge Instructions      Take the amoxicillin as directed.  Use the ipratropium nasal spray as directed.  Take the [facility] as directed.  Follow-up with your primary care provider tomorrow.     ED Prescriptions     Medication Sig Dispense Auth. Provider   amoxicillin (AMOXIL) 875 MG tablet Take 1 tablet (875 mg total) by mouth 2 (two) times daily for 10 days. 20 tablet Wendee Beavers H, NP   ipratropium (ATROVENT) 0.03 % nasal spray Place 2 sprays into both nostrils every 12 (twelve) hours. 30 mL Mickie Bail, NP   benzonatate (TESSALON) 100 MG capsule Take 1 capsule (100 mg total) by mouth 3 (three) times daily as needed for cough. 21 capsule Mickie Bail, NP      PDMP not reviewed this encounter.   Mickie Bail, NP 04/08/23 1133

## 2023-04-08 NOTE — Discharge Instructions (Addendum)
Take the amoxicillin as directed.  Use the ipratropium nasal spray as directed.  Take the [facility] as directed.  Follow-up with your primary care provider tomorrow.

## 2023-04-08 NOTE — ED Triage Notes (Signed)
Patient to Urgent Care with complaints of headaches/ post-nasal drip/ bilateral ear fullness/ cough. Has had episodes of feeling hot then cold chills. Extended sleeping.   Symptoms started Tuesday night. Worsened throughout the week. Taking corcidin.   Negative home Covid tests. Family sick w/ same symptoms.

## 2023-04-11 ENCOUNTER — Ambulatory Visit (INDEPENDENT_AMBULATORY_CARE_PROVIDER_SITE_OTHER): Payer: Medicare PPO | Admitting: Psychiatry

## 2023-04-11 ENCOUNTER — Encounter: Payer: Self-pay | Admitting: Psychiatry

## 2023-04-11 VITALS — BP 117/78 | HR 92 | Temp 97.1°F | Ht 64.5 in | Wt 160.8 lb

## 2023-04-11 DIAGNOSIS — G4701 Insomnia due to medical condition: Secondary | ICD-10-CM | POA: Diagnosis not present

## 2023-04-11 DIAGNOSIS — F411 Generalized anxiety disorder: Secondary | ICD-10-CM | POA: Diagnosis not present

## 2023-04-11 DIAGNOSIS — F3176 Bipolar disorder, in full remission, most recent episode depressed: Secondary | ICD-10-CM | POA: Diagnosis not present

## 2023-04-11 MED ORDER — TEMAZEPAM 30 MG PO CAPS
30.0000 mg | ORAL_CAPSULE | Freq: Every evening | ORAL | 3 refills | Status: DC | PRN
Start: 2023-04-11 — End: 2023-08-20

## 2023-04-11 NOTE — Progress Notes (Unsigned)
BH MD OP Progress Note  04/11/2023 11:31 AM Kaylee Gordon  MRN:  [id number]  Chief Complaint:  Chief Complaint  Patient presents with   Follow-up   Depression   Anxiety   Medication Refill   HPI: Kaylee Gordon is a 62 year old Caucasian female who has a history of bipolar disorder, GAD, fibromyalgia, married, lives in [place] was evaluated in office today.  Patient today reports she has recovered from her right foot fracture.  That was hard for her since she had to wear a boot.  She did go through an episode of depression at that time due to her physical limitations and pain.  She however completely recovered and is currently feeling better.  Denies any depression symptoms at this time.  Patient however does report anxiety, mostly worrying about her adult son and daughter.  She reports they fight with each other and that has been stressful for her.  She is trying to get her son to move out so he can grow and explore and be an adult.  She has been having some trouble with that.  She continues to work with her therapist Ms. Felecia Jan her last visit was yesterday.  Reports therapy sessions as beneficial.  Patient reports sleep is overall okay except for last 2 nights when sleep was restless due to her sinus congestion.  She is currently on amoxicillin.  Continues to be compliant on the temazepam.  Denies side effects.  Patient is compliant on Lexapro, Lamictal.  Denies side effects.  Patient denies any suicidality, homicidality or perceptual disturbances.  Patient denies any other concerns today.  Visit Diagnosis:    ICD-10-CM   1. Bipolar 1 disorder, depressed, full remission (HCC)  F31.76    Moderate    2. GAD (generalized anxiety disorder)  F41.1     3. Insomnia due to medical condition  G47.01 temazepam (RESTORIL) 30 MG capsule   anxiety, pain      Past Psychiatric History: I have reviewed past psychiatric history from progress note on 09/11/2018.  Past trials of  medications multiple including Ambien, Lunesta, Sonata, Belsomra.  Past Medical History:  Past Medical History:  Diagnosis Date   Anxiety    Asthma    B12 deficiency    Cancer (HCC)    SKIN CANCER   CHF (congestive heart failure) (HCC)    Depression    Hypertension    Thyroid disease     Past Surgical History:  Procedure Laterality Date   ANKLE SURGERY Right 2012   APPENDECTOMY     AUGMENTATION MAMMAPLASTY Bilateral    BREAST ENHANCEMENT SURGERY     COLONOSCOPY WITH PROPOFOL N/A 02/22/2015   Procedure: COLONOSCOPY WITH PROPOFOL;  Surgeon: Elnita Maxwell, MD;  Location[facility] ENDOSCOPY;  Service: Endoscopy;  Laterality: N/A;   COLONOSCOPY WITH PROPOFOL N/A 08/08/2022   Procedure: COLONOSCOPY WITH PROPOFOL;  Surgeon: Regis Bill, MD;  Location: [facility] ENDOSCOPY;  Service: Endoscopy;  Laterality: N/A;   ESOPHAGOGASTRODUODENOSCOPY (EGD) WITH PROPOFOL N/A 02/22/2015   Procedure: ESOPHAGOGASTRODUODENOSCOPY (EGD) WITH PROPOFOL;  Surgeon: Elnita Maxwell, MD;  Location: [facility] ENDOSCOPY;  Service: Endoscopy;  Laterality: N/A;   ESOPHAGOGASTRODUODENOSCOPY (EGD) WITH PROPOFOL N/A 02/11/2016   Procedure: ESOPHAGOGASTRODUODENOSCOPY (EGD) WITH PROPOFOL;  Surgeon: Scot Jun, MD;  Location: [facility] ENDOSCOPY;  Service: Endoscopy;  Laterality: N/A;   ESOPHAGOGASTRODUODENOSCOPY (EGD) WITH PROPOFOL N/A 08/08/2022   Procedure: ESOPHAGOGASTRODUODENOSCOPY (EGD) WITH PROPOFOL;  Surgeon: Regis Bill, MD;  Location: [facility] ENDOSCOPY;  Service: Endoscopy;  Laterality:  N/A;   HEMORRHOID SURGERY     NASAL SINUS SURGERY     SAVORY DILATION N/A 02/22/2015   Procedure: SAVORY DILATION;  Surgeon: Elnita Maxwell, MD;  Location: [facility] ENDOSCOPY;  Service: Endoscopy;  Laterality: N/A;   SAVORY DILATION N/A 02/11/2016   Procedure: SAVORY DILATION;  Surgeon: Scot Jun, MD;  Location: [facility] ENDOSCOPY;  Service: Endoscopy;  Laterality: N/A;   SKIN CANCER EXCISION      Family Psychiatric  History: I have reviewed family psychiatric history from progress note on 09/11/2018.  Family History:  Family History  Problem Relation Age of Onset   Cancer Mother        colon cancer   Depression Mother    Anxiety disorder Mother    Heart disease Father    Cancer Father        ? neck cancer- still living.    Anxiety disorder Father    Depression Father    Hypoparathyroidism Son    Colon cancer Maternal Aunt        died in 35s.     Social History: I have reviewed social history from progress note on 09/11/2018. Social History   Socioeconomic History   Marital status: Married    Spouse name: Casimiro Needle   Number of children: Not on file   Years of education: Not on file   Highest education level: Not on file  Occupational History   Not on file  Tobacco Use   Smoking status: Never   Smokeless tobacco: Never  Vaping Use   Vaping status: Not on file  Substance and Sexual Activity   Alcohol use: Not Currently   Drug use: No   Sexual activity: Not Currently    Partners: Male  Other Topics Concern   Not on file  Social History Narrative   Lives in [place]; taught high school- math; retd; no smoking; social alcohol.    Social Determinants of Health   Financial Resource Strain: Low Risk  (05/16/2018)   Overall Financial Resource Strain (CARDIA)    Difficulty of Paying Living Expenses: Not hard at all  Food Insecurity: No Food Insecurity (05/16/2018)   Hunger Vital Sign    Worried About Running Out of Food in the Last Year: Never true    Ran Out of Food in the Last Year: Never true  Transportation Needs: No Transportation Needs (05/16/2018)   PRAPARE - Administrator, Civil Service (Medical): No    Lack of Transportation (Non-Medical): No  Physical Activity: Unknown (05/16/2018)   Exercise Vital Sign    Days of Exercise per Week: 0 days    Minutes of Exercise per Session: Not on file  Stress: Stress Concern Present (05/16/2018)   [employer] of  Occupational Health - Occupational Stress Questionnaire    Feeling of Stress : Very much  Social Connections: Unknown (05/16/2018)   Social Connection and Isolation Panel [NHANES]    Frequency of Communication with Friends and Family: Twice a week    Frequency of Social Gatherings with Friends and Family: Twice a week    Attends Religious Services: More than 4 times per year    Active Member of [employer] or Organizations: Not on file    Attends Banker Meetings: Not on file    Marital Status: Married    Allergies:  Allergies  Allergen Reactions   Levofloxacin Other (See Comments) and Nausea Only    Lethargic, Flu-like symptoms, Hot flashes    Atorvastatin  Hydrochlorothiazide Other (See Comments)    Intolerance - dry lips   Parafon Forte Dsc [Chlorzoxazone]    Pollen Extract    Pseudoephedrine Other (See Comments)   Sudafed [Pseudoephedrine Hcl]     Heart racing    Metabolic Disorder Labs: Lab Results  Component Value Date   HGBA1C 5.9 (H) 02/16/2021   Lab Results  Component Value Date   PROLACTIN 18.3 07/22/2014   Lab Results  Component Value Date   CHOL 184 02/16/2021   TRIG 179 (H) 02/16/2021   HDL 37 (L) 02/16/2021   CHOLHDL 5.0 (H) 02/16/2021   LDLCALC 115 (H) 02/16/2021   Lab Results  Component Value Date   TSH 1.59 01/22/2023   TSH 1.19 07/18/2022    Therapeutic Level Labs: No results found for: "LITHIUM" Lab Results  Component Value Date   VALPROATE 39 (L) 02/04/2019   No results found for: "CBMZ"  Current Medications: Current Outpatient Medications  Medication Sig Dispense Refill   amoxicillin (AMOXIL) 875 MG tablet Take 1 tablet (875 mg total) by mouth 2 (two) times daily for 10 days. 20 tablet 0   calcitRIOL (ROCALTROL) 0.25 MCG capsule Take 1 capsule (0.25 mcg total) by mouth daily. 90 capsule 1   escitalopram (LEXAPRO) 10 MG tablet Take 1.5 tablets (15 mg total) by mouth daily. 135 tablet 0   fenofibrate 160 MG tablet Take 1  tablet by mouth daily.     fluticasone (FLONASE) 50 MCG/ACT nasal spray      gabapentin (NEURONTIN) 300 MG capsule Take 300 mg by mouth. 2 CAP AM, 1 CAP Q noon, 2 CAP pm,     ipratropium (ATROVENT) 0.03 % nasal spray Place 2 sprays into both nostrils every 12 (twelve) hours. 30 mL 12   isosorbide mononitrate (IMDUR) 30 MG 24 hr tablet      lamoTRIgine (LAMICTAL) 200 MG tablet Take 0.5 tablets (100 mg total) by mouth 2 (two) times daily. 90 tablet 0   levothyroxine (SYNTHROID) 75 MCG tablet Take 1 tablet (75 mcg total) by mouth daily. 90 tablet 3   lidocaine (XYLOCAINE) 2 % solution Use as directed 15 mLs in the mouth or throat as needed for mouth pain. 100 mL 0   lisinopril (ZESTRIL) 10 MG tablet Take 10 mg by mouth daily.     lovastatin (MEVACOR) 20 MG tablet Take 20 mg by mouth at bedtime.     prazosin (MINIPRESS) 2 MG capsule TAKE 1 CAPSULE BY MOUTH AT BEDTIME 90 capsule 0   promethazine (PHENERGAN) 25 MG tablet Take 25 mg by mouth every 6 (six) hours as needed for nausea or vomiting.     propranolol ER (INDERAL LA) 80 MG 24 hr capsule Take 80 mg by mouth daily.     benzonatate (TESSALON) 100 MG capsule Take 1 capsule (100 mg total) by mouth 3 (three) times daily as needed for cough. (Patient not taking: Reported on 04/11/2023) 21 capsule 0   temazepam (RESTORIL) 30 MG capsule Take 1 capsule (30 mg total) by mouth at bedtime as needed for sleep. 30 capsule 3   No current facility-administered medications for this visit.     Musculoskeletal: Strength & Muscle Tone: within normal limits Gait & Station: normal Patient leans: N/A  Psychiatric Specialty Exam: Review of Systems  HENT:  Positive for sinus pressure.   Psychiatric/Behavioral:  Positive for sleep disturbance. The patient is nervous/anxious.     Blood pressure 117/78, pulse 92, temperature (!) 97.1 F (36.2 C), temperature source Skin, height  5' 4.5" (1.638 m), weight 160 lb 12.8 oz (72.9 kg).Body mass index is 27.17 kg/m.   General Appearance: Fairly Groomed  Eye Contact:  Fair  Speech:  Normal Rate  Volume:  Normal  Mood:  Anxious  Affect:  Congruent  Thought Process:  Goal Directed and Descriptions of Associations: Intact  Orientation:  Full (Time, Place, and Person)  Thought Content: Logical   Suicidal Thoughts:  No  Homicidal Thoughts:  No  Memory:  Immediate;   Fair Recent;   Fair Remote;   Fair  Judgement:  Fair  Insight:  Fair  Psychomotor Activity:  Normal  Concentration:  Concentration: Fair and Attention Span: Fair  Recall:  [employer] of Knowledge: Fair  Language: Fair  Akathisia:  No  Handed:  Right  AIMS (if indicated): not done  Assets:  Communication Skills Desire for Improvement Housing Intimacy Social Support Transportation  ADL's:  Intact  Cognition: WNL  Sleep:   restless last 2 nights due to sinus congestion   Screenings: AIMS    Flowsheet Row Video Visit from 12/08/2021 in [facility]  AIMS Total Score 0      GAD-7    Flowsheet Row Office Visit from 11/21/2022 in [facility] Video Visit from 03/28/2022 in [facility] Video Visit from 02/06/2022 in [facility] Video Visit from 01/24/2022 in [facility] Video Visit from 12/08/2021 in [facility]  Total GAD-7 Score 14 10 7 10 4       PHQ2-9    Flowsheet Row Office Visit from 11/21/2022 in [facility] Video Visit from 06/27/2022 in [facility] Video Visit from 03/28/2022 in [facility] Video Visit from 02/06/2022 in [facility] Video Visit from 01/24/2022 in [facility]  PHQ-2 Total  Score 2 0 0 0 0  PHQ-9 Total Score 11 -- -- -- --      Flowsheet Row ED from 04/08/2023 in [facility]  Video Visit from 02/15/2023 in [facility] Office Visit from 11/21/2022 in [facility]  C-SSRS RISK CATEGORY No Risk No Risk No Risk        Assessment and Plan: Kaylee Gordon is a 62 year old Caucasian female, married, lives in [place], has a history of bipolar disorder, GAD, fibromyalgia, IBS, history of QT prolongation on EKG under the care of cardiology, recent closed fracture of right foot currently recovered, currently with situational stressors as noted above, will benefit from psychotherapy sessions, continue medications as noted below.  Plan Bipolar disorder type I depressed in remission Lexapro 15 mg p.o. daily Lamotrigine 200 mg p.o. daily in divided dosage. Continue CBT with Ms. Felecia Jan.  GAD-improving Lexapro 15 mg p.o. daily Lamotrigine 200 mg p.o. daily in divided dosage Continue CBT  Insomnia-stable Patient currently with sinus congestion on amoxicillin. Continue sleep hygiene techniques Temazepam 30 mg p.o. nightly Prazosin 2 mg p.o. nightly for nightmares Reviewed [place] PMP AWARxE      Collaboration of Care: Collaboration of Care: Referral or follow-up with counselor/therapist AEB patient encouraged to follow up with therapist.  Patient/Guardian was advised Release of Information must be obtained prior to any record release in order to collaborate their care with an outside provider. Patient/Guardian was  advised if they have not already done so to contact the registration department to sign all necessary forms in order for [place] to release information regarding their care.   Consent: Patient/Guardian gives verbal consent for treatment and assignment of benefits for services provided during this visit. Patient/Guardian expressed understanding and  agreed to proceed.   Follow-up in clinic in 3 months or sooner if needed.  This note was generated in part or whole with voice recognition software. Voice recognition is usually quite accurate but there are transcription errors that can and very often do occur. I apologize for any typographical errors that were not detected and corrected.    Jomarie Longs, MD 04/11/2023, 11:31 AM

## 2023-04-21 ENCOUNTER — Other Ambulatory Visit: Payer: Self-pay

## 2023-04-21 ENCOUNTER — Ambulatory Visit
Admission: RE | Admit: 2023-04-21 | Discharge: 2023-04-21 | Disposition: A | Discharge status: Home / Self Care | Payer: Medicare PPO | Source: Ambulatory Visit | Attending: Emergency Medicine | Admitting: Emergency Medicine

## 2023-04-21 VITALS — BP 119/69 | HR 85 | Temp 99.3°F | Resp 16 | Ht 64.0 in | Wt 157.0 lb

## 2023-04-21 DIAGNOSIS — J0101 Acute recurrent maxillary sinusitis: Secondary | ICD-10-CM | POA: Diagnosis not present

## 2023-04-21 MED ORDER — CEFDINIR 300 MG PO CAPS: 300.0000 mg | ORAL_CAPSULE | Freq: Two times a day (BID) | ORAL | 0 refills | Status: AC

## 2023-04-21 NOTE — ED Provider Notes (Signed)
Kaylee Gordon    CSN: [id number] Arrival date & time: 04/21/23  1410      History   Chief Complaint Chief Complaint  Patient presents with   Nasal Congestion    Entered by patient    HPI Kaylee Gordon is a 62 y.o. female.  Patient presents with recurrent sinus congestion, sinus pressure, sinus pain, postnasal drip, ear fullness, mild cough.  No fever, chills, shortness of breath, or other symptoms.  Patient was seen here on 04/08/2023; diagnosed with acute sinusitis and cough; treated with amoxicillin, Tessalon Perles, ipratropium nasal spray.  Her symptoms improved with treatment but then returned 2 days ago and are worse.    The history is provided by the patient and medical records.    Past Medical History:  Diagnosis Date   Anxiety    Asthma    B12 deficiency    Cancer (HCC)    SKIN CANCER   CHF (congestive heart failure) (HCC)    Depression    Hypertension    Thyroid disease     Patient Active Problem List   Diagnosis Date Noted   Medicare annual wellness visit, initial 03/27/2022   History of prolonged Q-T interval on ECG 01/24/2022   Trochanteric bursitis of left hip 01/20/2022   Bereavement 10/03/2021   Bipolar 1 disorder, depressed, moderate (HCC) 10/03/2021   Elevated blood pressure reading 06/06/2021   Prolonged Q-T interval on ECG 06/02/2021   Prolonged QT syndrome 06/01/2021   Bipolar disorder, in full remission, most recent episode depressed (HCC) 05/24/2021   Bipolar 1 disorder, depressed, partial remission (HCC) 02/10/2021   History of colonic polyps 08/16/2020   CKD (chronic kidney disease) stage 3, GFR 30-59 ml/min (HCC) 08/16/2020   Insomnia due to medical condition 06/25/2020   At risk for prolonged QT interval syndrome 06/25/2020   Skull lesion 02/24/2020   Tremor 05/08/2019   Bipolar I disorder, most recent episode depressed (HCC) 12/30/2018   B12 deficiency 11/22/2018   Low iron 11/22/2018   History of normocytic normochromic  anemia 11/21/2018   Iron deficiency anemia secondary to inadequate dietary iron intake 11/21/2018   Multiple lung nodules on CT 04/19/2018   Borderline diabetes mellitus 08/10/2017   Paroxysmal supraventricular tachycardia (HCC) 09/27/2015   Benign essential HTN 06/22/2015   Combined fat and carbohydrate induced hyperlipemia 06/22/2015   Breathlessness on exertion 06/17/2015   Pure hypercholesterolemia 04/22/2015   Congestive heart failure with left ventricular systolic dysfunction (HCC) 03/24/2015   Acquired hypothyroidism 02/04/2015   Heart failure, systolic (HCC) 02/04/2015   Anxiety 12/28/2014   Allergic rhinitis 10/27/2014   Bipolar 1 disorder, depressed (HCC) 10/27/2014   Hypercholesterolemia without hypertriglyceridemia 10/27/2014   Depression, major, recurrent, moderate (HCC) 10/27/2014   Adult hypothyroidism 10/27/2014   GAD (generalized anxiety disorder) 10/27/2014   Essential (primary) hypertension 10/27/2014   Colon polyp 10/27/2014   Hypoparathyroidism (HCC) 02/14/2013    Past Surgical History:  Procedure Laterality Date   ANKLE SURGERY Right 2012   APPENDECTOMY     AUGMENTATION MAMMAPLASTY Bilateral    BREAST ENHANCEMENT SURGERY     COLONOSCOPY WITH PROPOFOL N/A 02/22/2015   Procedure: COLONOSCOPY WITH PROPOFOL;  Surgeon: Elnita Maxwell, MD;  Location: [facility] ENDOSCOPY;  Service: Endoscopy;  Laterality: N/A;   COLONOSCOPY WITH PROPOFOL N/A 08/08/2022   Procedure: COLONOSCOPY WITH PROPOFOL;  Surgeon: Regis Bill, MD;  Location: [facility] ENDOSCOPY;  Service: Endoscopy;  Laterality: N/A;   ESOPHAGOGASTRODUODENOSCOPY (EGD) WITH PROPOFOL N/A 02/22/2015   Procedure: ESOPHAGOGASTRODUODENOSCOPY (EGD)  WITH PROPOFOL;  Surgeon: Elnita Maxwell, MD;  Location: [facility] ENDOSCOPY;  Service: Endoscopy;  Laterality: N/A;   ESOPHAGOGASTRODUODENOSCOPY (EGD) WITH PROPOFOL N/A 02/11/2016   Procedure: ESOPHAGOGASTRODUODENOSCOPY (EGD) WITH PROPOFOL;  Surgeon: Scot Jun, MD;   Location: [facility] ENDOSCOPY;  Service: Endoscopy;  Laterality: N/A;   ESOPHAGOGASTRODUODENOSCOPY (EGD) WITH PROPOFOL N/A 08/08/2022   Procedure: ESOPHAGOGASTRODUODENOSCOPY (EGD) WITH PROPOFOL;  Surgeon: Regis Bill, MD;  Location: [facility] ENDOSCOPY;  Service: Endoscopy;  Laterality: N/A;   HEMORRHOID SURGERY     NASAL SINUS SURGERY     SAVORY DILATION N/A 02/22/2015   Procedure: SAVORY DILATION;  Surgeon: Elnita Maxwell, MD;  Location: [facility] ENDOSCOPY;  Service: Endoscopy;  Laterality: N/A;   SAVORY DILATION N/A 02/11/2016   Procedure: SAVORY DILATION;  Surgeon: Scot Jun, MD;  Location: [facility] ENDOSCOPY;  Service: Endoscopy;  Laterality: N/A;   SKIN CANCER EXCISION      OB History   No obstetric history on file.      Home Medications    Prior to Admission medications   Medication Sig Start Date End Date Taking? Authorizing Provider  cefdinir (OMNICEF) 300 MG capsule Take 1 capsule (300 mg total) by mouth 2 (two) times daily for 10 days. 04/21/23 05/01/23 Yes Mickie Bail, NP  benzonatate (TESSALON) 100 MG capsule Take 1 capsule (100 mg total) by mouth 3 (three) times daily as needed for cough. Patient not taking: Reported on 04/11/2023 04/08/23   Mickie Bail, NP  calcitRIOL (ROCALTROL) 0.25 MCG capsule Take 1 capsule (0.25 mcg total) by mouth daily. 01/24/23   Reather Littler, MD  escitalopram (LEXAPRO) 10 MG tablet Take 1.5 tablets (15 mg total) by mouth daily. 02/15/23   Jomarie Longs, MD  fenofibrate 160 MG tablet Take 1 tablet by mouth daily. 12/17/20   [provider]  fluticasone Aleda Grana) 50 MCG/ACT nasal spray  12/25/12   [provider]  gabapentin (NEURONTIN) 300 MG capsule Take 300 mg by mouth. 2 CAP AM, 1 CAP Q noon, 2 CAP pm, 10/06/19   [provider]  ipratropium (ATROVENT) 0.03 % nasal spray Place 2 sprays into both nostrils every 12 (twelve) hours. 04/08/23   Mickie Bail, NP  isosorbide mononitrate (IMDUR) 30 MG 24 hr tablet  02/06/23    [provider]  lamoTRIgine (LAMICTAL) 200 MG tablet Take 0.5 tablets (100 mg total) by mouth 2 (two) times daily. 02/15/23   Jomarie Longs, MD  levothyroxine (SYNTHROID) 75 MCG tablet Take 1 tablet (75 mcg total) by mouth daily. 01/24/23   Reather Littler, MD  lidocaine (XYLOCAINE) 2 % solution Use as directed 15 mLs in the mouth or throat as needed for mouth pain. 07/11/22   Mickie Bail, NP  lisinopril (ZESTRIL) 10 MG tablet Take 10 mg by mouth daily. 05/26/19   [provider]  lovastatin (MEVACOR) 20 MG tablet Take 20 mg by mouth at bedtime.    [provider]  prazosin (MINIPRESS) 2 MG capsule TAKE 1 CAPSULE BY MOUTH AT BEDTIME 02/05/23   Jomarie Longs, MD  promethazine (PHENERGAN) 25 MG tablet Take 25 mg by mouth every 6 (six) hours as needed for nausea or vomiting.    [provider]  propranolol ER (INDERAL LA) 80 MG 24 hr capsule Take 80 mg by mouth daily. 10/08/22   [provider]  temazepam (RESTORIL) 30 MG capsule Take 1 capsule (30 mg total) by mouth at bedtime as needed for sleep. 04/11/23 08/09/23  Jomarie Longs, MD  Family History Family History  Problem Relation Age of Onset   Cancer Mother        colon cancer   Depression Mother    Anxiety disorder Mother    Heart disease Father    Cancer Father        ? neck cancer- still living.    Anxiety disorder Father    Depression Father    Hypoparathyroidism Son    Colon cancer Maternal Aunt        died in 62s.     Social History Social History   Tobacco Use   Smoking status: Never   Smokeless tobacco: Never  Substance Use Topics   Alcohol use: Not Currently   Drug use: No     Allergies   Levofloxacin, Atorvastatin, Hydrochlorothiazide, Parafon forte dsc [chlorzoxazone], Pollen extract, Pseudoephedrine, and Sudafed [pseudoephedrine hcl]   Review of Systems Review of Systems  Constitutional:  Negative for chills and fever.  HENT:  Positive for congestion, ear pain,  postnasal drip, rhinorrhea, sinus pressure and sinus pain. Negative for sore throat.   Respiratory:  Positive for cough. Negative for shortness of breath.   Cardiovascular:  Negative for chest pain and palpitations.     Physical Exam Triage Vital Signs ED Triage Vitals  Encounter Vitals Group     BP 04/21/23 1440 119/69     Systolic BP Percentile --      Diastolic BP Percentile --      Pulse Rate 04/21/23 1440 85     Resp 04/21/23 1440 16     Temp 04/21/23 1440 99.3 F (37.4 C)     Temp Source 04/21/23 1440 Oral     SpO2 04/21/23 1440 97 %     Weight 04/21/23 1441 157 lb (71.2 kg)     Height 04/21/23 1441 5\' 4"  (1.626 m)     Head Circumference --      Peak Flow --      Pain Score 04/21/23 1441 7     Pain Loc --      Pain Education --      Exclude from Growth Chart --    No data found.  Updated Vital Signs BP 119/69   Pulse 85   Temp 99.3 F (37.4 C) (Oral)   Resp 16   Ht 5\' 4"  (1.626 m)   Wt 157 lb (71.2 kg)   SpO2 97%   BMI 26.95 kg/m   Visual Acuity Right Eye Distance:   Left Eye Distance:   Bilateral Distance:    Right Eye Near:   Left Eye Near:    Bilateral Near:     Physical Exam Constitutional:      General: She is not in acute distress. HENT:     Right Ear: Tympanic membrane normal.     Left Ear: Tympanic membrane normal.     Nose: Congestion and rhinorrhea present.     Mouth/Throat:     Mouth: Mucous membranes are moist.     Pharynx: Oropharynx is clear.  Cardiovascular:     Rate and Rhythm: Normal rate and regular rhythm.     Heart sounds: Normal heart sounds.  Pulmonary:     Effort: Pulmonary effort is normal. No respiratory distress.     Breath sounds: Normal breath sounds.  Skin:    General: Skin is warm and dry.  Neurological:     Mental Status: She is alert.      UC Treatments / Results  Labs (all labs ordered  are listed, but only abnormal results are displayed) Labs Reviewed - No data to display  EKG   Radiology No  results found.  Procedures Procedures (including critical care time)  Medications Ordered in UC Medications - No data to display  Initial Impression / Assessment and Plan / UC Course  I have reviewed the triage vital signs and the nursing notes.  Pertinent labs & imaging results that were available during my care of the patient were reviewed by me and considered in my medical decision making (see chart for details).    Acute recurrent sinusitis.  Patient recently completed a course of amoxicillin and her symptoms had improved until 2 days ago when they returned and are worse.  Treating today with cefdinir.  Instructed patient to follow-up with her PCP on Monday.  Education provided on sinus infection.  She agrees to plan of care.  Final Clinical Impressions(s) / UC Diagnoses   Final diagnoses:  Acute recurrent maxillary sinusitis     Discharge Instructions      Take the cefdinir as directed.  Follow-up with your primary care provider on Monday.     ED Prescriptions     Medication Sig Dispense Auth. Provider   cefdinir (OMNICEF) 300 MG capsule Take 1 capsule (300 mg total) by mouth 2 (two) times daily for 10 days. 20 capsule Mickie Bail, NP      PDMP not reviewed this encounter.   Mickie Bail, NP 04/21/23 1501

## 2023-04-21 NOTE — Discharge Instructions (Addendum)
Take the cefdinir as directed.  Follow-up with your primary care provider on Monday.

## 2023-04-21 NOTE — ED Triage Notes (Signed)
Pt seen here recently for sinus infection and finished abx Tuesday.  Thursday she reports feels like the sx came back worse than initially.  C/o severe congestion, face pain, and drainage in throat.  Ears feel stopped up per pt.

## 2023-05-06 ENCOUNTER — Other Ambulatory Visit: Payer: Self-pay | Admitting: Psychiatry

## 2023-05-06 DIAGNOSIS — G4701 Insomnia due to medical condition: Secondary | ICD-10-CM

## 2023-05-07 ENCOUNTER — Telehealth: Payer: Self-pay | Admitting: Endocrinology

## 2023-05-07 ENCOUNTER — Other Ambulatory Visit: Payer: Self-pay

## 2023-05-07 DIAGNOSIS — E200 Idiopathic hypoparathyroidism: Secondary | ICD-10-CM

## 2023-05-07 MED ORDER — LEVOTHYROXINE SODIUM 75 MCG PO TABS
75.0000 ug | ORAL_TABLET | Freq: Every day | ORAL | 3 refills | Status: DC
Start: 2023-05-07 — End: 2024-02-14

## 2023-05-07 NOTE — Telephone Encounter (Signed)
Patient is almost out of her Levothyroxine 75MG , please call Kaylee Gordon in [place], [address]. Advise patient when done.

## 2023-05-07 NOTE — Telephone Encounter (Signed)
Levothyroxine refill request complete

## 2023-05-13 ENCOUNTER — Other Ambulatory Visit: Payer: Self-pay | Admitting: Psychiatry

## 2023-05-13 DIAGNOSIS — F3132 Bipolar disorder, current episode depressed, moderate: Secondary | ICD-10-CM

## 2023-07-05 ENCOUNTER — Other Ambulatory Visit: Payer: Self-pay | Admitting: Psychiatry

## 2023-07-05 ENCOUNTER — Telehealth: Payer: Self-pay

## 2023-07-05 DIAGNOSIS — F411 Generalized anxiety disorder: Secondary | ICD-10-CM

## 2023-07-05 NOTE — Telephone Encounter (Signed)
I have sent Lexapro to pharmacy as requested.

## 2023-07-05 NOTE — Telephone Encounter (Signed)
Medication refill - Call wth pt, after she left a message she is in need of a new Escitalopram 10 mg order, last provided for 90 days on 02/15/23. Patient stated she returns 07/16/23 but is now out of Escitalopram and needs a new order to be sent into her preferred [facility] on [address].  Patient reported she was not aware she would need a new order until she called her pharmacy and was told she was out of refills.  Patient requests a new order be sent into today so she will not have to go any days without prior to seeing Dr. Elna Breslow on 07/16/23. Patient reported no other concerns or issues with medications.

## 2023-07-16 ENCOUNTER — Encounter: Payer: Self-pay | Admitting: Psychiatry

## 2023-07-16 ENCOUNTER — Telehealth: Payer: Medicare PPO | Admitting: Psychiatry

## 2023-07-16 DIAGNOSIS — F411 Generalized anxiety disorder: Secondary | ICD-10-CM

## 2023-07-16 DIAGNOSIS — F3176 Bipolar disorder, in full remission, most recent episode depressed: Secondary | ICD-10-CM | POA: Diagnosis not present

## 2023-07-16 DIAGNOSIS — G4701 Insomnia due to medical condition: Secondary | ICD-10-CM

## 2023-07-16 DIAGNOSIS — R41840 Attention and concentration deficit: Secondary | ICD-10-CM | POA: Diagnosis not present

## 2023-07-16 DIAGNOSIS — Z9189 Other specified personal risk factors, not elsewhere classified: Secondary | ICD-10-CM

## 2023-07-16 MED ORDER — LAMOTRIGINE 200 MG PO TABS: 100.0000 mg | ORAL_TABLET | Freq: Two times a day (BID) | ORAL | 1 refills | Status: DC

## 2023-07-16 MED ORDER — PRAZOSIN HCL 2 MG PO CAPS: 2.0000 mg | ORAL_CAPSULE | Freq: Every day | ORAL | 1 refills | Status: DC

## 2023-07-24 ENCOUNTER — Encounter: Payer: Self-pay | Admitting: Endocrinology

## 2023-07-24 ENCOUNTER — Ambulatory Visit: Payer: Medicare PPO | Admitting: Endocrinology

## 2023-07-24 VITALS — BP 122/80 | HR 72 | Resp 16 | Ht 64.0 in | Wt 162.0 lb

## 2023-07-24 DIAGNOSIS — E039 Hypothyroidism, unspecified: Secondary | ICD-10-CM | POA: Diagnosis not present

## 2023-07-24 DIAGNOSIS — E200 Idiopathic hypoparathyroidism: Secondary | ICD-10-CM

## 2023-07-24 NOTE — Progress Notes (Signed)
 Outpatient Endocrinology Note Kaylee Thivierge, MD   Patient's Name: Kaylee Gordon    DOB: 01/21/1961    MRN: [id number]  REASON OF VISIT:  Follow-up for hypothyroidism /hypoparathyroidism  PCP: Alla Amis, MD  HISTORY OF PRESENT ILLNESS:   Kaylee Gordon is a 63 y.o. old female with past medical history as listed below is presented for a follow up for hypothyroidism /hypoparathyroidism.   Pertinent History: Patient was previously seen by Dr. Von and was last time seen in July 2024.  # Hypothyroidism : ?  Secondary She was initially given thyroxine supplement with a borderline TSH levels in 2011. However because of complaints of  fatigue on her visit in 10/15 she had thyroid  levels done and free T4 was significantly low She was empirically started on 25 g  of levothyroxine . Initially she felt less tired with this; however her free T4 continue to be low and her dose had been increased progressively.  There was question of secondary hypothyroidism, based on prior endocrinology office note.  Lately she has been taking levothyroxine  75 mcg daily.    # Hypoparathyroidism -Patient has history of hypercalcemia since age of 29.  She initially presented with symptoms of cramping in the hands along with twitching of the face and eyes.  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  She has been taking calcitriol  0.25 mcg daily in the morning and calcium 1200 mcg daily in the evening as needed additional calcium.  She eats fairly good amount of dairy products  /milk daily.  She does not take vitamin D3 supplement.  No history of kidney stone.   Interval history Patient has been taking levothyroxine  75 mcg daily, calcitriol  0.25 mcg daily and calcium 1200 mg daily.  She takes as needed calcium in case of numbness and muscle cramps.  She does not take vitamin D3 supplement.  Overall feeling good.  Denies hypo and hyperthyroid symptoms.  Denies numbness and  tingling and muscle cramps.  No other complaints today.  She is not sure about timing of levothyroxine , probably taking in the morning before breakfast, calcitriol  and calcium supplement.  She takes some of the medicines in the morning and some at bedtime.  REVIEW OF SYSTEMS:  As per history of present illness.   PAST MEDICAL HISTORY: Past Medical History:  Diagnosis Date   Anxiety    Asthma    B12 deficiency    Cancer (HCC)    SKIN CANCER   CHF (congestive heart failure) (HCC)    Depression    Hypertension    Thyroid  disease     PAST SURGICAL HISTORY: Past Surgical History:  Procedure Laterality Date   ANKLE SURGERY Right 2012   APPENDECTOMY     AUGMENTATION MAMMAPLASTY Bilateral    BREAST ENHANCEMENT SURGERY     COLONOSCOPY WITH PROPOFOL  N/A 02/22/2015   Procedure: COLONOSCOPY WITH PROPOFOL ;  Surgeon: Donnice Vaughn Manes, MD;  Location[facility]COPY;  Service: Endoscopy;  Laterality: N/A;   COLONOSCOPY WITH PROPOFOL  N/A 08/08/2022   Procedure: COLONOSCOPY WITH PROPOFOL ;  Surgeon: Maryruth Ole DASEN, MD;  Location: [facility] ENDOSCOPY;  Service: Endoscopy;  Laterality: N/A;   ESOPHAGOGASTRODUODENOSCOPY (EGD) WITH PROPOFOL  N/A 02/22/2015   Procedure: ESOPHAGOGASTRODUODENOSCOPY (EGD) WITH PROPOFOL ;  Surgeon: Donnice Vaughn Manes, MD;  Location[facility] ENDOSCOPY;  Service: Endoscopy;  Laterality: N/A;   ESOPHAGOGASTRODUODENOSCOPY (EGD) WITH PROPOFOL  N/A 02/11/2016   Procedure: ESOPHAGOGASTRODUODENOSCOPY (EGD) WITH PROPOFOL ;  Surgeon: Lamar DASEN Holmes, MD;  Location[facility] END[facility]COPY;  Service: Endoscopy;  Laterality: N/A;   ESOPHAGOGASTRODUODENOSCOPY (EGD) WITH PROPOFOL  N/A 08/08/2022   Procedure: ESOPHAGOGASTRODUODENOSCOPY (EGD) WITH PROPOFOL ;  Surgeon: Maryruth Ole DASEN, MD;  Location: [facility] ENDOSCOPY;  Service: Endoscopy;  Laterality: N/A;   HEMORRHOID SURGERY     NASAL SINUS SURGERY     SAVORY DILATION N/A 02/22/2015   Procedure: SAVORY DILATION;  Surgeon: Donnice Vaughn Manes, MD;   Location: [facility] ENDOSCOPY;  Service: Endoscopy;  Laterality: N/A;   SAVORY DILATION N/A 02/11/2016   Procedure: SAVORY DILATION;  Surgeon: Lamar DASEN Holmes, MD;  Location: [facility] ENDOSCOPY;  Service: Endoscopy;  Laterality: N/A;   SKIN CANCER EXCISION      ALLERGIES: Allergies  Allergen Reactions   Levofloxacin Other (See Comments) and Nausea Only    Lethargic, Flu-like symptoms, Hot flashes    Atorvastatin    Hydrochlorothiazide Other (See Comments)    Intolerance - dry lips   Parafon Forte Dsc [Chlorzoxazone]    Pollen Extract    Pseudoephedrine Other (See Comments)   Sudafed [Pseudoephedrine Hcl]     Heart racing    FAMILY HISTORY:  Family History  Problem Relation Age of Onset   Cancer Mother        colon cancer   Depression Mother    Anxiety disorder Mother    Heart disease Father    Cancer Father        ? neck cancer- still living.    Anxiety disorder Father    Depression Father    Hypoparathyroidism Son    Colon cancer Maternal Aunt        died in 43s.     SOCIAL HISTORY: Social History   Socioeconomic History   Marital status: Married    Spouse name: Ozell   Number of children: Not on file   Years of education: Not on file   Highest education level: Not on file  Occupational History   Not on file  Tobacco Use   Smoking status: Never   Smokeless tobacco: Never  Vaping Use   Vaping status: Not on file  Substance and Sexual Activity   Alcohol use: Not Currently   Drug use: No   Sexual activity: Not Currently    Partners: Male  Other Topics Concern   Not on file  Social History Narrative   Lives in [place]; taught high school- math; retd; no smoking; social alcohol.    Social Drivers of Corporate Investment Banker Strain: Low Risk  (05/16/2018)   Overall Financial Resource Strain (CARDIA)    Difficulty of Paying Living Expenses: Not hard at all  Food Insecurity: No Food Insecurity (05/16/2018)   Hunger Vital Sign    Worried About Running  Out of Food in the Last Year: Never true    Ran Out of Food in the Last Year: Never true  Transportation Needs: No Transportation Needs (05/16/2018)   PRAPARE - Administrator, Civil Service (Medical): No    Lack of Transportation (Non-Medical): No  Physical Activity: Unknown (05/16/2018)   Exercise Vital Sign    Days of Exercise per Week: 0 days    Minutes of Exercise per Session: Not on file  Stress: Stress Concern Present (05/16/2018)   [employer] of Occupational Health - Occupational Stress Questionnaire    Feeling of Stress : Very much  Social Connections: Unknown (05/16/2018)   Social Connection and Isolation Panel [NHANES]    Frequency of Communication with Friends and Family: Twice a week    Frequency of Social Gatherings with  Friends and Family: Twice a week    Attends Religious Services: More than 4 times per year    Active Member of Clubs or Organizations: Not on file    Attends Banker Meetings: Not on file    Marital Status: Married    MEDICATIONS:  Current Outpatient Medications  Medication Sig Dispense Refill   benzonatate  (TESSALON ) 100 MG capsule Take 1 capsule (100 mg total) by mouth 3 (three) times daily as needed for cough. (Patient not taking: Reported on 04/11/2023) 21 capsule 0   calcitRIOL  (ROCALTROL ) 0.25 MCG capsule Take 1 capsule (0.25 mcg total) by mouth daily. 90 capsule 1   escitalopram  (LEXAPRO ) 10 MG tablet TAKE 1 AND 1/2 TABLET BY MOUTH DAILY 135 tablet 0   esomeprazole (NEXIUM) 40 MG capsule Take 1 capsule by mouth daily.     fenofibrate 160 MG tablet Take 1 tablet by mouth daily.     fluticasone (FLONASE) 50 MCG/ACT nasal spray      gabapentin  (NEURONTIN ) 300 MG capsule Take 300 mg by mouth. 2 CAP AM, 1 CAP Q noon, 2 CAP pm,     ipratropium (ATROVENT ) 0.03 % nasal spray Place 2 sprays into both nostrils every 12 (twelve) hours. 30 mL 12   isosorbide mononitrate (IMDUR) 30 MG 24 hr tablet      lamoTRIgine  (LAMICTAL )  200 MG tablet Take 0.5 tablets (100 mg total) by mouth 2 (two) times daily. 90 tablet 1   levothyroxine  (SYNTHROID ) 75 MCG tablet Take 1 tablet (75 mcg total) by mouth daily. 90 tablet 3   lisinopril (ZESTRIL) 10 MG tablet Take 10 mg by mouth daily.     lovastatin (MEVACOR) 20 MG tablet Take 20 mg by mouth at bedtime.     prazosin  (MINIPRESS ) 2 MG capsule Take 1 capsule (2 mg total) by mouth at bedtime. 90 capsule 1   promethazine (PHENERGAN) 25 MG tablet Take 25 mg by mouth every 6 (six) hours as needed for nausea or vomiting.     propranolol ER (INDERAL LA) 80 MG 24 hr capsule Take 80 mg by mouth daily.     temazepam  (RESTORIL ) 30 MG capsule Take 1 capsule (30 mg total) by mouth at bedtime as needed for sleep. 30 capsule 3   No current facility-administered medications for this visit.    PHYSICAL EXAM: Vitals:   07/24/23 1511  BP: 122/80  Pulse: 72  Resp: 16  SpO2: 97%  Weight: 162 lb (73.5 kg)  Height: 5' 4 (1.626 m)   Body mass index is 27.81 kg/m.  Wt Readings from Last 3 Encounters:  07/24/23 162 lb (73.5 kg)  04/21/23 157 lb (71.2 kg)  01/22/23 158 lb 12.8 oz (72 kg)    General: Well developed, well nourished female in no apparent distress.  HEENT: AT/[place], no external lesions. Hearing intact to the spoken word Eyes: EOMI. No stare, proptosis or lid lag. Conjunctiva clear and no icterus. Neck: Trachea midline, neck supple without appreciable thyromegaly or lymphadenopathy and no palpable thyroid  nodules Lungs: Clear to auscultation, no wheeze. Respirations not labored Heart: S1S2, Regular in rate and rhythm. Abdomen: Soft, non tender, non distended Neurologic: Alert, oriented, normal speech, deep tendon biceps reflexes normal,  no gross focal neurological deficit Extremities: No pedal pitting edema, no tremors of outstretched hands.  Chovstek's sign negative. Skin: Warm, color good.  Psychiatric: Does not appear depressed or anxious  PERTINENT HISTORIC LABORATORY AND  IMAGING STUDIES:  All pertinent laboratory results were reviewed. Please see HPI  also for further details.   TSH  Date Value Ref Range Status  01/22/2023 1.59 0.35 - 5.50 uIU/mL Final  07/18/2022 1.19 0.35 - 5.50 uIU/mL Final  12/05/2021 3.30 0.35 - 5.50 uIU/mL Final     ASSESSMENT / PLAN  1. Idiopathic hypoparathyroidism (HCC)   2. Hypothyroidism, unspecified type    # Hypothyroidism: -Question of secondary hypothyroidism based on prior endocrinology office note.  She has been on thyroid  hormone replacement since her diagnosis seems to be around 2015. -She is currently taking levothyroxine  75 mcg daily.  # Hypoparathyroidism :  -Diagnosed at the age of 16, at [facility], has primary idiopathic hypoparathyroidism. -Currently taking calcitriol  0.25 mcg daily and calcium 1200 mg daily.  She takes as needed calcium for symptoms.  Plan: -Check thyroid  function test -Check BMP, albumin, phosphorus and vitamin D  level today.  We discussed the medical need for compliance with levothyroxine  therapy, that it is a hormone necessary for life, and that serious consequences may result from noncompliance. Discussed the proper method of levothyroxine  administration: take on an empty stomach in the morning, with water, waiting thirty to sixty minutes before taking any other beverages or food. Also reviewed the need to take calcium or iron  supplements or multivitamin (that may contain iron  or calcium) at least 4 hours after levothyroxine  administration.  Especially advised to take calcium supplement at least 2 to 3 hours after taking levothyroxine .   Diagnoses and all orders for this visit:  Idiopathic hypoparathyroidism (HCC) -     BASIC METABOLIC PANEL WITH GFR -     Albumin -     Phosphorus -     VITAMIN D  25 Hydroxy (Vit-D Deficiency, Fractures)  Hypothyroidism, unspecified type -     T4, free -     TSH    DISPOSITION Follow up in clinic in 6 months suggested.  Labs today and  labs on same day of the visit.  All questions answered and patient verbalized understanding of the plan.  Kaylee Robles, MD [facility] Endocrinology [facility] [address] [place], [address] [place], [place] [address] Phone # [phone]  At least part of this note was generated using voice recognition software. Inadvertent word errors may have occurred, which were not recognized during the proofreading process.

## 2023-07-25 LAB — BASIC METABOLIC PANEL WITH GFR
BUN/Creatinine Ratio: 17 (calc) (ref 6–22)
BUN: 21 mg/dL (ref 7–25)
CO2: 26 mmol/L (ref 20–32)
Calcium: 9 mg/dL (ref 8.6–10.4)
Chloride: 106 mmol/L (ref 98–110)
Creat: 1.26 mg/dL — ABNORMAL HIGH (ref 0.50–1.05)
Glucose, Bld: 149 mg/dL — ABNORMAL HIGH (ref 65–99)
Potassium: 3.8 mmol/L (ref 3.5–5.3)
Sodium: 142 mmol/L (ref 135–146)
eGFR: 48 mL/min/{1.73_m2} — ABNORMAL LOW (ref >=60)

## 2023-07-25 LAB — VITAMIN D 25 HYDROXY (VIT D DEFICIENCY, FRACTURES): Vit D, 25-Hydroxy: 32 ng/mL (ref 30–100)

## 2023-07-25 LAB — PHOSPHORUS: Phosphorus: 4.1 mg/dL (ref 2.5–4.5)

## 2023-07-25 LAB — ALBUMIN: Albumin: 4.3 g/dL (ref 3.6–5.1)

## 2023-07-25 LAB — TSH: TSH: 2.03 m[IU]/L (ref 0.40–4.50)

## 2023-07-25 LAB — T4, FREE: Free T4: 1.3 ng/dL (ref 0.8–1.8)

## 2023-07-25 MED ORDER — CALCITRIOL 0.25 MCG PO CAPS
0.2500 ug | ORAL_CAPSULE | Freq: Every day | ORAL | 3 refills | Status: AC
Start: 2023-07-25 — End: ?

## 2023-07-25 NOTE — Addendum Note (Signed)
 Addended by: Massiel Stipp, [place] on: 07/25/2023 08:15 AM   Modules accepted: Orders

## 2023-07-26 ENCOUNTER — Ambulatory Visit: Payer: Medicare PPO

## 2023-07-26 DIAGNOSIS — K219 Gastro-esophageal reflux disease without esophagitis: Secondary | ICD-10-CM | POA: Diagnosis not present

## 2023-07-26 DIAGNOSIS — K449 Diaphragmatic hernia without obstruction or gangrene: Secondary | ICD-10-CM | POA: Diagnosis not present

## 2023-07-26 DIAGNOSIS — R1319 Other dysphagia: Secondary | ICD-10-CM | POA: Diagnosis not present

## 2023-07-30 ENCOUNTER — Encounter: Payer: Self-pay | Admitting: Psychology

## 2023-07-31 ENCOUNTER — Encounter: Payer: Self-pay | Admitting: Endocrinology

## 2023-08-19 ENCOUNTER — Other Ambulatory Visit: Payer: Self-pay | Admitting: Psychiatry

## 2023-08-19 DIAGNOSIS — G4701 Insomnia due to medical condition: Secondary | ICD-10-CM

## 2023-09-13 ENCOUNTER — Ambulatory Visit
Admission: RE | Admit: 2023-09-13 | Discharge: 2023-09-13 | Disposition: A | Discharge status: Home / Self Care | Payer: Self-pay | Source: Ambulatory Visit | Attending: Emergency Medicine | Admitting: Emergency Medicine

## 2023-09-13 ENCOUNTER — Ambulatory Visit: Payer: Self-pay

## 2023-09-13 VITALS — BP 110/70 | HR 90 | Temp 100.4°F | Resp 18 | Wt 157.0 lb

## 2023-09-13 DIAGNOSIS — N1831 Chronic kidney disease, stage 3a: Secondary | ICD-10-CM

## 2023-09-13 DIAGNOSIS — U071 COVID-19: Secondary | ICD-10-CM | POA: Diagnosis not present

## 2023-09-13 LAB — POC COVID19/FLU A&B COMBO
Covid Antigen, POC: POSITIVE — AB
Influenza A Antigen, POC: NEGATIVE
Influenza B Antigen, POC: NEGATIVE

## 2023-09-13 MED ORDER — PAXLOVID (150/100) 10 X 150 MG & 10 X 100MG PO TBPK: 1.0000 | ORAL_TABLET | Freq: Two times a day (BID) | ORAL | 0 refills | Status: DC

## 2023-09-13 MED ORDER — PAXLOVID (150/100) 10 X 150 MG & 10 X 100MG PO TBPK
2.0000 | ORAL_TABLET | Freq: Two times a day (BID) | ORAL | 0 refills | Status: AC
Start: 2023-09-13 — End: 2023-09-18

## 2023-09-13 NOTE — ED Triage Notes (Signed)
 Cough, headache, chills, barking cough that started yesterday. Taking coricidin, tylenol, mucinex, OTC cough syrup.

## 2023-09-13 NOTE — ED Provider Notes (Signed)
 Kaylee Gordon    CSN: [id number] Arrival date & time: 09/13/23  1112      History   Chief Complaint Chief Complaint  Patient presents with   Cough    Entered by patient   Headache    HPI Kaylee Gordon is a 63 y.o. female.  Patient presents with chills, headache, cough since yesterday.  No OTC medication taken today.  Yesterday she was treating her symptoms with Tylenol, Coricidin, Mucinex, OTC cough syrup.  No fever, shortness of breath, vomiting, diarrhea.  Her medical history includes CKD, paroxysmal supraventricular tachycardia, hypertension, heart failure.  The history is provided by the patient and medical records.    Past Medical History:  Diagnosis Date   Anxiety    Asthma    B12 deficiency    Cancer (HCC)    SKIN CANCER   CHF (congestive heart failure) (HCC)    Depression    Hypertension    Thyroid disease     Patient Active Problem List   Diagnosis Date Noted   Attention and concentration deficit 07/16/2023   Medicare annual wellness visit, initial 03/27/2022   History of prolonged Q-T interval on ECG 01/24/2022   Trochanteric bursitis of left hip 01/20/2022   Bereavement 10/03/2021   Bipolar 1 disorder, depressed, moderate (HCC) 10/03/2021   Elevated blood pressure reading 06/06/2021   Prolonged Q-T interval on ECG 06/02/2021   Prolonged QT syndrome 06/01/2021   Bipolar 1 disorder, depressed, full remission (HCC) 05/24/2021   Bipolar 1 disorder, depressed, partial remission (HCC) 02/10/2021   History of colonic polyps 08/16/2020   CKD (chronic kidney disease) stage 3, GFR 30-59 ml/min (HCC) 08/16/2020   Insomnia due to medical condition 06/25/2020   At risk for prolonged QT interval syndrome 06/25/2020   Skull lesion 02/24/2020   Tremor 05/08/2019   Bipolar I disorder, most recent episode depressed (HCC) 12/30/2018   B12 deficiency 11/22/2018   Low iron 11/22/2018   History of normocytic normochromic anemia 11/21/2018   Iron deficiency  anemia secondary to inadequate dietary iron intake 11/21/2018   Multiple lung nodules on CT 04/19/2018   Borderline diabetes mellitus 08/10/2017   Paroxysmal supraventricular tachycardia (HCC) 09/27/2015   Benign essential HTN 06/22/2015   Combined fat and carbohydrate induced hyperlipemia 06/22/2015   Breathlessness on exertion 06/17/2015   Pure hypercholesterolemia 04/22/2015   Congestive heart failure with left ventricular systolic dysfunction (HCC) 03/24/2015   Acquired hypothyroidism 02/04/2015   Heart failure, systolic (HCC) 02/04/2015   Anxiety 12/28/2014   Allergic rhinitis 10/27/2014   Bipolar 1 disorder, depressed (HCC) 10/27/2014   Hypercholesterolemia without hypertriglyceridemia 10/27/2014   Depression, major, recurrent, moderate (HCC) 10/27/2014   Adult hypothyroidism 10/27/2014   GAD (generalized anxiety disorder) 10/27/2014   Essential (primary) hypertension 10/27/2014   Colon polyp 10/27/2014   Hypoparathyroidism (HCC) 02/14/2013    Past Surgical History:  Procedure Laterality Date   ANKLE SURGERY Right 2012   APPENDECTOMY     AUGMENTATION MAMMAPLASTY Bilateral    BREAST ENHANCEMENT SURGERY     COLONOSCOPY WITH PROPOFOL N/A 02/22/2015   Procedure: COLONOSCOPY WITH PROPOFOL;  Surgeon: Elnita Maxwell, MD;  Location: [facility] ENDOSCOPY;  Service: Endoscopy;  Laterality: N/A;   COLONOSCOPY WITH PROPOFOL N/A 08/08/2022   Procedure: COLONOSCOPY WITH PROPOFOL;  Surgeon: Regis Bill, MD;  Location: [facility] ENDOSCOPY;  Service: Endoscopy;  Laterality: N/A;   ESOPHAGOGASTRODUODENOSCOPY (EGD) WITH PROPOFOL N/A 02/22/2015   Procedure: ESOPHAGOGASTRODUODENOSCOPY (EGD) WITH PROPOFOL;  Surgeon: Elnita Maxwell, MD;  Location:  [facility] ENDOSCOPY;  Service: Endoscopy;  Laterality: N/A;   ESOPHAGOGASTRODUODENOSCOPY (EGD) WITH PROPOFOL N/A 02/11/2016   Procedure: ESOPHAGOGASTRODUODENOSCOPY (EGD) WITH PROPOFOL;  Surgeon: Scot Jun, MD;  Location: [facility] ENDOSCOPY;  Service:  Endoscopy;  Laterality: N/A;   ESOPHAGOGASTRODUODENOSCOPY (EGD) WITH PROPOFOL N/A 08/08/2022   Procedure: ESOPHAGOGASTRODUODENOSCOPY (EGD) WITH PROPOFOL;  Surgeon: Regis Bill, MD;  Location: [facility] ENDOSCOPY;  Service: Endoscopy;  Laterality: N/A;   HEMORRHOID SURGERY     NASAL SINUS SURGERY     SAVORY DILATION N/A 02/22/2015   Procedure: SAVORY DILATION;  Surgeon: Elnita Maxwell, MD;  Location: [facility] ENDOSCOPY;  Service: Endoscopy;  Laterality: N/A;   SAVORY DILATION N/A 02/11/2016   Procedure: SAVORY DILATION;  Surgeon: Scot Jun, MD;  Location: [facility] ENDOSCOPY;  Service: Endoscopy;  Laterality: N/A;   SKIN CANCER EXCISION      OB History   No obstetric history on file.      Home Medications    Prior to Admission medications   Medication Sig Start Date End Date Taking? Authorizing Provider  calcitRIOL (ROCALTROL) 0.25 MCG capsule Take 1 capsule (0.25 mcg total) by mouth daily. 07/25/23  Yes Thapa, [place], MD  escitalopram (LEXAPRO) 10 MG tablet TAKE 1 AND 1/2 TABLET BY MOUTH DAILY 07/05/23  Yes Eappen, Levin Bacon, MD  esomeprazole (NEXIUM) 40 MG capsule Take 1 capsule by mouth daily. 06/04/23  Yes [provider]  fenofibrate 160 MG tablet Take 1 tablet by mouth daily. 12/17/20  Yes [provider]  fluticasone Aleda Grana) 50 MCG/ACT nasal spray  12/25/12  Yes [provider]  gabapentin (NEURONTIN) 300 MG capsule Take 300 mg by mouth. 2 CAP AM, 1 CAP Q noon, 2 CAP pm, 10/06/19  Yes [provider]  isosorbide mononitrate (IMDUR) 30 MG 24 hr tablet  02/06/23  Yes [provider]  lamoTRIgine (LAMICTAL) 200 MG tablet Take 0.5 tablets (100 mg total) by mouth 2 (two) times daily. 07/16/23  Yes Jomarie Longs, MD  levothyroxine (SYNTHROID) 75 MCG tablet Take 1 tablet (75 mcg total) by mouth daily. 05/07/23  Yes Thapa, [place], MD  lisinopril (ZESTRIL) 10 MG tablet Take 10 mg by mouth daily. 05/26/19  Yes [provider]  lovastatin  (MEVACOR) 20 MG tablet Take 20 mg by mouth at bedtime.   Yes [provider]  prazosin (MINIPRESS) 2 MG capsule Take 1 capsule (2 mg total) by mouth at bedtime. 07/16/23  Yes Jomarie Longs, MD  propranolol ER (INDERAL LA) 80 MG 24 hr capsule Take 80 mg by mouth daily. 10/08/22  Yes [provider]  temazepam (RESTORIL) 30 MG capsule TAKE 1 CAPSULE BY MOUTH EVERY NIGHT AT BEDTIME AS NEEDED FOR SLEEP 08/20/23  Yes Eappen, Levin Bacon, MD  benzonatate (TESSALON) 100 MG capsule Take 1 capsule (100 mg total) by mouth 3 (three) times daily as needed for cough. Patient not taking: Reported on 04/11/2023 04/08/23   Mickie Bail, NP  ipratropium (ATROVENT) 0.03 % nasal spray Place 2 sprays into both nostrils every 12 (twelve) hours. 04/08/23   Mickie Bail, NP  nirmatrelvir/ritonavir, renal dosing, (PAXLOVID, 150/100,) 10 x 150 MG & 10 x 100MG  TBPK Take 2 tablets by mouth 2 (two) times daily for 5 days. 09/13/23 09/18/23  Mickie Bail, NP  promethazine (PHENERGAN) 25 MG tablet Take 25 mg by mouth every 6 (six) hours as needed for nausea or vomiting.    [provider]    Family History Family History  Problem Relation Age of Onset  Cancer Mother        colon cancer   Depression Mother    Anxiety disorder Mother    Heart disease Father    Cancer Father        ? neck cancer- still living.    Anxiety disorder Father    Depression Father    Hypoparathyroidism Son    Colon cancer Maternal Aunt        died in 24s.     Social History Social History   Tobacco Use   Smoking status: Never   Smokeless tobacco: Never  Substance Use Topics   Alcohol use: Not Currently   Drug use: No     Allergies   Levofloxacin, Atorvastatin, Hydrochlorothiazide, Parafon forte dsc [chlorzoxazone], Pollen extract, Pseudoephedrine, and Sudafed [pseudoephedrine hcl]   Review of Systems Review of Systems  Constitutional:  Positive for chills. Negative for fever.  HENT:  Negative for ear pain  and sore throat.   Respiratory:  Positive for cough. Negative for shortness of breath.   Gastrointestinal:  Negative for diarrhea and vomiting.  Neurological:  Positive for headaches.     Physical Exam Triage Vital Signs ED Triage Vitals  Encounter Vitals Group     BP 09/13/23 1124 110/70     Systolic BP Percentile --      Diastolic BP Percentile --      Pulse Rate 09/13/23 1124 90     Resp 09/13/23 1124 18     Temp 09/13/23 1124 (!) 100.4 F (38 C)     Temp src --      SpO2 09/13/23 1124 96 %     Weight --      Height --      Head Circumference --      Peak Flow --      Pain Score 09/13/23 1131 8     Pain Loc --      Pain Education --      Exclude from Growth Chart --    No data found.  Updated Vital Signs BP 110/70   Pulse 90   Temp (!) 100.4 F (38 C)   Resp 18   Wt 157 lb (71.2 kg)   SpO2 96%   BMI 26.95 kg/m   Visual Acuity Right Eye Distance:   Left Eye Distance:   Bilateral Distance:    Right Eye Near:   Left Eye Near:    Bilateral Near:     Physical Exam Constitutional:      General: She is not in acute distress. HENT:     Right Ear: Tympanic membrane normal.     Left Ear: Tympanic membrane normal.     Nose: Nose normal.     Mouth/Throat:     Mouth: Mucous membranes are moist.     Pharynx: Oropharynx is clear.  Cardiovascular:     Rate and Rhythm: Normal rate and regular rhythm.     Heart sounds: Normal heart sounds.  Pulmonary:     Effort: Pulmonary effort is normal. No respiratory distress.     Breath sounds: Normal breath sounds.  Neurological:     Mental Status: She is alert.      UC Treatments / Results  Labs (all labs ordered are listed, but only abnormal results are displayed) Labs Reviewed  POC COVID19/FLU A&B COMBO - Abnormal; Notable for the following components:      Result Value   Covid Antigen, POC Positive (*)    All other components within normal  limits    EKG   Radiology No results  found.  Procedures Procedures (including critical care time)  Medications Ordered in UC Medications - No data to display  Initial Impression / Assessment and Plan / UC Course  I have reviewed the triage vital signs and the nursing notes.  Pertinent labs & imaging results that were available during my care of the patient were reviewed by me and considered in my medical decision making (see chart for details).   COVID-19, CKD 3.  Rapid COVID-positive.  Flu negative.  GFR 56 on 08/15/2023. Creatinine clearance approximately 58 based on current weight.  Treating with Paxlovid (renally dosed).  Discussed that this medication is emergency authorized for treatment of COVID.  Discussed the side effects of Paxlovid, including dysgeusia, diarrhea, myalgias, hypertension.  Also discussed the possibility of rebound COVID.  Instructed her to pause her statin while on Paxlovid and then for 5 additional days.  Instructed patient to notify her PCP that she is COVID positive and taking Paxlovid.  ED precautions given.  Patient agrees to plan of care.    Final Clinical Impressions(s) / UC Diagnoses   Final diagnoses:  COVID-19  Stage 3a chronic kidney disease (HCC)     Discharge Instructions      Take the Paxlovid as directed.  Take Tylenol as needed for fever or discomfort.  Rest and keep yourself hydrated.    Stop taking your lovastatin while taking Paxlovid and for 5 additional days after the Paxlovid is finished.   Go to the emergency department if you have shortness of breath or other concerning symptoms.    Contact your primary care provider to let them know that you are COVID positive and taking Paxlovid.         ED Prescriptions     Medication Sig Dispense Auth. Provider   nirmatrelvir/ritonavir, renal dosing, (PAXLOVID, 150/100,) 10 x 150 MG & 10 x 100MG  TBPK  (Status: Discontinued) Take 1 tablet by mouth 2 (two) times daily for 5 days. 10 tablet Mickie Bail, NP    nirmatrelvir/ritonavir, renal dosing, (PAXLOVID, 150/100,) 10 x 150 MG & 10 x 100MG  TBPK Take 2 tablets by mouth 2 (two) times daily for 5 days. 20 tablet Mickie Bail, NP      PDMP not reviewed this encounter.   Mickie Bail, NP 09/13/23 [phone]

## 2023-09-13 NOTE — Discharge Instructions (Addendum)
 Take the Paxlovid as directed.  Take Tylenol as needed for fever or discomfort.  Rest and keep yourself hydrated.    Stop taking your lovastatin while taking Paxlovid and for 5 additional days after the Paxlovid is finished.   Go to the emergency department if you have shortness of breath or other concerning symptoms.    Contact your primary care provider to let them know that you are COVID positive and taking Paxlovid.

## 2023-09-16 NOTE — Progress Notes (Unsigned)
 Virtual Visit via Video Note  I connected with Kaylee Gordon on 09/18/23 at 11:00 AM EST by a video enabled telemedicine application and verified that I am speaking with the correct person using two identifiers.  Location Provider Location : [facility] Patient Location : Home  Participants: Patient , Provider   I discussed the limitations of evaluation and management by telemedicine and the availability of in person appointments. The patient expressed understanding and agreed to proceed.   I discussed the assessment and treatment plan with the patient. The patient was provided an opportunity to ask questions and all were answered. The patient agreed with the plan and demonstrated an understanding of the instructions.   The patient was advised to call back or seek an in-person evaluation if the symptoms worsen or if the condition fails to improve as anticipated.   BH MD OP Progress Note  09/18/2023 11:26 AM Kaylee Gordon  MRN:  [id number]  Chief Complaint:  Chief Complaint  Patient presents with   Follow-up   Depression   Anxiety   Medication Refill   HPI: Kaylee Gordon is a 63 year old Caucasian female who has a history of bipolar disorder, GAD, fibromyalgia, married, lives in [place] was evaluated by telemedicine today.  She tested positive for COVID-19 on Thursday after initially testing negative with a home test and a flu/COVID test on Wednesday. She has been bedridden since last Wednesday due to severe fatigue, sleeping most of the day and staying awake for only an hour or two at a time. She experiences an upset stomach but no nausea or diarrhea. She is not very hungry but stays hydrated with her husband's help. She is concerned about her husband and son contracting the virus, although her son is vaccinated. She received a flu shot but did not receive a COVID vaccine this year.  She has a history of bipolar disorder, which was in remission at her last visit in December. She  is currently taking Lexapro 15 mg and lamotrigine 200 mg daily. No current mood changes are reported, but a full assessment is difficult due to her COVID-19 symptoms.  She has generalized anxiety disorder and continues on her current medication regimen. No specific anxiety symptoms are discussed during this visit.  She has insomnia, managed with temazepam and prazosin. She reports sleeping well despite COVID-19 symptoms but requires a refill of temazepam.  She is dealing with impending cataract surgery and uses eye drops for corneal abnormalities in preparation for the surgery.  She currently denies any suicidality, homicidality or perceptual disturbances.   Visit Diagnosis:    ICD-10-CM   1. Bipolar 1 disorder, depressed, full remission (HCC)  F31.76     2. GAD (generalized anxiety disorder)  F41.1 escitalopram (LEXAPRO) 10 MG tablet    3. Insomnia due to medical condition  G47.01 temazepam (RESTORIL) 30 MG capsule   anxiety, pain    4. Attention and concentration deficit  R41.840       Past Psychiatric History: I have reviewed past psychiatric history from progress note on 09/11/2018.  Past trials of medications like Ambien, Lunesta, Sonata, Belsomra.  Past Medical History:  Past Medical History:  Diagnosis Date   Anxiety    Asthma    B12 deficiency    Cancer (HCC)    SKIN CANCER   CHF (congestive heart failure) (HCC)    Depression    Hypertension    Thyroid disease     Past Surgical History:  Procedure Laterality Date  ANKLE SURGERY Right 2012   APPENDECTOMY     AUGMENTATION MAMMAPLASTY Bilateral    BREAST ENHANCEMENT SURGERY     COLONOSCOPY WITH PROPOFOL N/A 02/22/2015   Procedure: COLONOSCOPY WITH PROPOFOL;  Surgeon: Elnita Maxwell, MD;  Location: [facility] ENDOSCOPY;  Service: Endoscopy;  Laterality: N/A;   COLONOSCOPY WITH PROPOFOL N/A 08/08/2022   Procedure: COLONOSCOPY WITH PROPOFOL;  Surgeon: Regis Bill, MD;  Location: [facility] ENDOSCOPY;  Service:  Endoscopy;  Laterality: N/A;   ESOPHAGOGASTRODUODENOSCOPY (EGD) WITH PROPOFOL N/A 02/22/2015   Procedure: ESOPHAGOGASTRODUODENOSCOPY (EGD) WITH PROPOFOL;  Surgeon: Elnita Maxwell, MD;  Location[facility] ENDOSCOPY;  Service: Endoscopy;  Laterality: N/A;   ESOPHAGOGASTRODUODENOSCOPY (EGD) WITH PROPOFOL N/A 02/11/2016   Procedure: ESOPHAGOGASTRODUODENOSCOPY (EGD) WITH PROPOFOL;  Surgeon: Scot Jun, MD;  Location: [facility] ENDOSCOPY;  Service: Endoscopy;  Laterality: N/A;   ESOPHAGOGASTRODUODENOSCOPY (EGD) WITH PROPOFOL N/A 08/08/2022   Procedure: ESOPHAGOGASTRODUODENOSCOPY (EGD) WITH PROPOFOL;  Surgeon: Regis Bill, MD;  Location: [facility] ENDOSCOPY;  Service: Endoscopy;  Laterality: N/A;   HEMORRHOID SURGERY     NASAL SINUS SURGERY     SAVORY DILATION N/A 02/22/2015   Procedure: SAVORY DILATION;  Surgeon: Elnita Maxwell, MD;  Location: [facility] ENDOSCOPY;  Service: Endoscopy;  Laterality: N/A;   SAVORY DILATION N/A 02/11/2016   Procedure: SAVORY DILATION;  Surgeon: Scot Jun, MD;  Location: [facility] ENDOSCOPY;  Service: Endoscopy;  Laterality: N/A;   SKIN CANCER EXCISION      Family Psychiatric History: I have reviewed family psychiatric history from progress note on 09/11/2018.  Family History:  Family History  Problem Relation Age of Onset   Cancer Mother        colon cancer   Depression Mother    Anxiety disorder Mother    Heart disease Father    Cancer Father        ? neck cancer- still living.    Anxiety disorder Father    Depression Father    Hypoparathyroidism Son    Colon cancer Maternal Aunt        died in 71s.     Social History: I have reviewed social history from progress note on 09/11/2018. Social History   Socioeconomic History   Marital status: Married    Spouse name: Casimiro Needle   Number of children: Not on file   Years of education: Not on file   Highest education level: Not on file  Occupational History   Not on file  Tobacco Use   Smoking status: Never    Smokeless tobacco: Never  Vaping Use   Vaping status: Not on file  Substance and Sexual Activity   Alcohol use: Not Currently   Drug use: No   Sexual activity: Not Currently    Partners: Male  Other Topics Concern   Not on file  Social History Narrative   Lives in [place]; taught high school- math; retd; no smoking; social alcohol.    Social Drivers of Corporate investment banker Strain: Low Risk  (08/07/2023)   Received from [facility]   Overall Financial Resource Strain (CARDIA)    Difficulty of Paying Living Expenses: Not hard at all  Food Insecurity: No Food Insecurity (08/07/2023)   Received from [facility]   Hunger Vital Sign    Worried About Running Out of Food in the Last Year: Never true    Ran Out of Food in the Last Year: Never true  Transportation Needs: No Transportation Needs (08/07/2023)   Received  from [facility]   PRAPARE - Transportation    Lack of Transportation (Non-Medical): No    In the past 12 months, has lack of transportation kept you from medical appointments or from getting medications?: No  Physical Activity: Unknown (05/16/2018)   Exercise Vital Sign    Days of Exercise per Week: 0 days    Minutes of Exercise per Session: Not on file  Stress: Stress Concern Present (05/16/2018)   [employer] of Occupational Health - Occupational Stress Questionnaire    Feeling of Stress : Very much  Social Connections: Unknown (05/16/2018)   Social Connection and Isolation Panel [NHANES]    Frequency of Communication with Friends and Family: Twice a week    Frequency of Social Gatherings with Friends and Family: Twice a week    Attends Religious Services: More than 4 times per year    Active Member of [employer] or Organizations: Not on file    Attends Banker Meetings: Not on file    Marital Status: Married    Allergies:  Allergies  Allergen Reactions   Levofloxacin Other (See  Comments) and Nausea Only    Lethargic, Flu-like symptoms, Hot flashes    Atorvastatin    Hydrochlorothiazide Other (See Comments)    Intolerance - dry lips   Parafon Forte Dsc [Chlorzoxazone]    Pollen Extract    Pseudoephedrine Other (See Comments)   Sudafed [Pseudoephedrine Hcl]     Heart racing    Metabolic Disorder Labs: Lab Results  Component Value Date   HGBA1C 5.9 (H) 02/16/2021   Lab Results  Component Value Date   PROLACTIN 18.3 07/22/2014   Lab Results  Component Value Date   CHOL 184 02/16/2021   TRIG 179 (H) 02/16/2021   HDL 37 (L) 02/16/2021   CHOLHDL 5.0 (H) 02/16/2021   LDLCALC 115 (H) 02/16/2021   Lab Results  Component Value Date   TSH 2.03 07/24/2023   TSH 1.59 01/22/2023    Therapeutic Level Labs: No results found for: "LITHIUM" Lab Results  Component Value Date   VALPROATE 39 (L) 02/04/2019   No results found for: "CBMZ"  Current Medications: Current Outpatient Medications  Medication Sig Dispense Refill   ALREX 0.2 % SUSP Apply 1 drop to eye 3 (three) times daily.     cyanocobalamin (VITAMIN B12) 1000 MCG/ML injection Inject into the muscle.     EYSUVIS 0.25 % SUSP Instill ONE drop IN EACH EYE THREE TIMES DAILY     FLUBLOK 0.5 ML SOSY Inject into the muscle.     benzonatate (TESSALON) 100 MG capsule Take 1 capsule (100 mg total) by mouth 3 (three) times daily as needed for cough. (Patient not taking: Reported on 04/11/2023) 21 capsule 0   calcitRIOL (ROCALTROL) 0.25 MCG capsule Take 1 capsule (0.25 mcg total) by mouth daily. 90 capsule 3   escitalopram (LEXAPRO) 10 MG tablet Take 1.5 tablets (15 mg total) by mouth daily. 135 tablet 0   esomeprazole (NEXIUM) 40 MG capsule Take 1 capsule by mouth daily.     fenofibrate 160 MG tablet Take 1 tablet by mouth daily.     fluticasone (FLONASE) 50 MCG/ACT nasal spray      gabapentin (NEURONTIN) 300 MG capsule Take 300 mg by mouth. 2 CAP AM, 1 CAP Q noon, 2 CAP pm,     ipratropium (ATROVENT) 0.03 %  nasal spray Place 2 sprays into both nostrils every 12 (twelve) hours. 30 mL 12   isosorbide mononitrate (IMDUR)  30 MG 24 hr tablet      lamoTRIgine (LAMICTAL) 200 MG tablet Take 0.5 tablets (100 mg total) by mouth 2 (two) times daily. 90 tablet 1   levothyroxine (SYNTHROID) 75 MCG tablet Take 1 tablet (75 mcg total) by mouth daily. 90 tablet 3   lisinopril (ZESTRIL) 10 MG tablet Take 10 mg by mouth daily.     lovastatin (MEVACOR) 20 MG tablet Take 20 mg by mouth at bedtime.     nirmatrelvir/ritonavir, renal dosing, (PAXLOVID, 150/100,) 10 x 150 MG & 10 x 100MG  TBPK Take 2 tablets by mouth 2 (two) times daily for 5 days. 20 tablet 0   prazosin (MINIPRESS) 2 MG capsule Take 1 capsule (2 mg total) by mouth at bedtime. 90 capsule 1   promethazine (PHENERGAN) 25 MG tablet Take 25 mg by mouth every 6 (six) hours as needed for nausea or vomiting.     propranolol ER (INDERAL LA) 80 MG 24 hr capsule Take 80 mg by mouth daily.     temazepam (RESTORIL) 30 MG capsule Take 1 capsule (30 mg total) by mouth at bedtime. 30 capsule 2   VEVYE 0.1 % SOLN Apply 1 drop to eye 2 (two) times daily.     No current facility-administered medications for this visit.     Musculoskeletal: Strength & Muscle Tone:  UTA Gait & Station:  Seated Patient leans: N/A  Psychiatric Specialty Exam: Review of Systems  Psychiatric/Behavioral:  Positive for sleep disturbance. The patient is nervous/anxious.     There were no vitals taken for this visit.There is no height or weight on file to calculate BMI.  General Appearance: Casual  Eye Contact:  Fair  Speech:  Clear and Coherent  Volume:  Normal  Mood:  Anxious  Affect:  Congruent  Thought Process:  Goal Directed and Descriptions of Associations: Intact  Orientation:  Full (Time, Place, and Person)  Thought Content: Logical   Suicidal Thoughts:  No  Homicidal Thoughts:  No  Memory:  Immediate;   Fair Recent;   Fair Remote;   Fair  Judgement:  Fair  Insight:  Fair   Psychomotor Activity:  Normal  Concentration:  Concentration: Fair and Attention Span: Fair  Recall:  [employer] of Knowledge: Fair  Language: Fair  Akathisia:  No  Handed:  Right  AIMS (if indicated): not done  Assets:  Desire for Improvement Housing Social Support Transportation  ADL's:  Intact  Cognition: WNL  Sleep:   Excessive due to COVID-19 infection   Screenings: AIMS    Flowsheet Row Video Visit from 12/08/2021 in [facility]  AIMS Total Score 0      GAD-7    Flowsheet Row Office Visit from 04/11/2023 in [facility] Office Visit from 11/21/2022 in [facility] Video Visit from 03/28/2022 in [facility] Video Visit from 02/06/2022 in [facility] Video Visit from 01/24/2022 in [facility]  Total GAD-7 Score 21 14 10 7 10       PHQ2-9    Flowsheet Row Office Visit from 04/11/2023 in [facility] Office Visit from 11/21/2022 in [facility] Video Visit from 06/27/2022 in [facility] Video Visit from 03/28/2022 in [facility] Video Visit from 02/06/2022 in [facility]  PHQ-2 Total Score  2 2 0 0 0  PHQ-9 Total Score 11 11 -- -- --      Flowsheet Row Video Visit from 09/18/2023 in [facility] ED from 09/13/2023 in [facility] at [facility]  Video Visit from 07/16/2023 in [facility]  C-SSRS RISK CATEGORY No Risk No Risk No Risk        Assessment and Plan: Kaylee Gordon is a 63 year old Caucasian female who has a history of bipolar disorder,  generalized anxiety disorder, insomnia was evaluated by telemedicine today for a follow-up.  Discussed assessment and plan as noted below.  Bipolar Disorder in remission Bipolar disorder, currently in remission. No current mood changes reported, does have fatigue and excessive sleepiness due to COVID-19 symptoms. - Continue Lexapro 15 mg daily - Continue Lamotrigine 200 mg daily  Generalized Anxiety Disorder-stable Generalized anxiety disorder. No specific concerns or medication questions raised during this visit. - Continue current management - Continue CBT with Ms. Felecia Jan  Insomnia-currently sleep excessive due to COVID-19 infection Insomnia managed with temazepam 30 mg and prazosin 2 mg at bedtime. Reports running out of temazepam. - Continue Temazepam 30 mg at bedtime-hold if excessive sleepiness. - Continue prazosin 2 mg at bedtime for nightmares  Attention and concentration deficit-has upcoming appointment with Dr. Kieth Brightly for testing to rule out ADHD.  At risk for prolonged QT syndrome-noncompliant with EKG.  Patient with COVID-19 symptoms-advised to follow up with primary care provider or go to nearest emergency department with any changes in her symptoms advised to stay well hydrated and get enough rest.  She has good support system from her partner.  Follow-up - Follow-up in clinic on April 4 11:20 AM by video.   Collaboration of Care: Collaboration of Care: Referral or follow-up with counselor/therapist AEB patient encouraged to follow up with therapist.  Patient/Guardian was advised Release of Information must be obtained prior to any record release in order to collaborate their care with an outside provider. Patient/Guardian was advised if they have not already done so to contact the registration department to sign all necessary forms in order for [place] to release information regarding their care.   Consent: Patient/Guardian gives verbal consent for treatment and  assignment of benefits for services provided during this visit. Patient/Guardian expressed understanding and agreed to proceed.  Discussed the use of a AI scribe software for clinical note transcription with the patient, who gave verbal consent to proceed. This note was generated in part or whole with voice recognition software. Voice recognition is usually quite accurate but there are transcription errors that can and very often do occur. I apologize for any typographical errors that were not detected and corrected.      Jomarie Longs, MD 09/18/2023, 11:26 AM

## 2023-09-18 ENCOUNTER — Telehealth: Payer: Medicare PPO | Admitting: Psychiatry

## 2023-09-18 ENCOUNTER — Encounter: Payer: Self-pay | Admitting: Psychiatry

## 2023-09-18 DIAGNOSIS — G4701 Insomnia due to medical condition: Secondary | ICD-10-CM

## 2023-09-18 DIAGNOSIS — R41840 Attention and concentration deficit: Secondary | ICD-10-CM | POA: Diagnosis not present

## 2023-09-18 DIAGNOSIS — F3176 Bipolar disorder, in full remission, most recent episode depressed: Secondary | ICD-10-CM | POA: Diagnosis not present

## 2023-09-18 DIAGNOSIS — F411 Generalized anxiety disorder: Secondary | ICD-10-CM

## 2023-09-18 MED ORDER — ESCITALOPRAM OXALATE 10 MG PO TABS: 15.0000 mg | ORAL_TABLET | Freq: Every day | ORAL | 0 refills | Status: DC

## 2023-09-18 MED ORDER — TEMAZEPAM 30 MG PO CAPS: 30.0000 mg | ORAL_CAPSULE | Freq: Every day | ORAL | 2 refills | Status: DC

## 2023-09-27 ENCOUNTER — Ambulatory Visit
Admission: RE | Admit: 2023-09-27 | Discharge: 2023-09-27 | Disposition: A | Discharge status: Home / Self Care | Source: Ambulatory Visit | Attending: Emergency Medicine | Admitting: Emergency Medicine

## 2023-09-27 VITALS — BP 133/74 | HR 75 | Temp 99.0°F | Resp 18

## 2023-09-27 DIAGNOSIS — R519 Headache, unspecified: Secondary | ICD-10-CM | POA: Diagnosis not present

## 2023-09-27 DIAGNOSIS — J0110 Acute frontal sinusitis, unspecified: Secondary | ICD-10-CM

## 2023-09-27 MED ORDER — AZITHROMYCIN 250 MG PO TABS: 250.0000 mg | ORAL_TABLET | Freq: Every day | ORAL | 0 refills | Status: DC

## 2023-09-27 NOTE — ED Triage Notes (Signed)
 Patient to Urgent Care with complaints of a frontal/ sinus headache that started Monday. Recent Covid.  Meds: Coricidin.   Reports temp of 99 is fever for her.

## 2023-09-27 NOTE — Discharge Instructions (Addendum)
 Take the Zithromax as directed.  Follow up with your primary care provider tomorrow.  Go to the emergency department if you have worsening symptoms.

## 2023-09-27 NOTE — ED Provider Notes (Signed)
 Kaylee Gordon    CSN: [id number] Arrival date & time: 09/27/23  1724      History   Chief Complaint Chief Complaint  Patient presents with   Headache    Entered by patient    HPI Kaylee Gordon is a 63 y.o. female.  Patient presents with 4-day history of headache, sinus pressure, congestion, postnasal drip, runny nose.  She has mild lingering cough from recent COVID.  No OTC medications taken today but previously attempted treatment with Coricidin.  No shortness of breath.  Patient was seen here on 09/13/2023; diagnosed with COVID-19; treated with renally dosed Paxlovid.  The history is provided by the patient and medical records.    Past Medical History:  Diagnosis Date   Anxiety    Asthma    B12 deficiency    Cancer (HCC)    SKIN CANCER   CHF (congestive heart failure) (HCC)    Depression    Hypertension    Thyroid disease     Patient Active Problem List   Diagnosis Date Noted   Attention and concentration deficit 07/16/2023   Medicare annual wellness visit, initial 03/27/2022   History of prolonged Q-T interval on ECG 01/24/2022   Trochanteric bursitis of left hip 01/20/2022   Bereavement 10/03/2021   Bipolar 1 disorder, depressed, moderate (HCC) 10/03/2021   Elevated blood pressure reading 06/06/2021   Prolonged Q-T interval on ECG 06/02/2021   Prolonged QT syndrome 06/01/2021   Bipolar 1 disorder, depressed, full remission (HCC) 05/24/2021   Bipolar 1 disorder, depressed, partial remission (HCC) 02/10/2021   History of colonic polyps 08/16/2020   CKD (chronic kidney disease) stage 3, GFR 30-59 ml/min (HCC) 08/16/2020   Insomnia due to medical condition 06/25/2020   At risk for prolonged QT interval syndrome 06/25/2020   Skull lesion 02/24/2020   Tremor 05/08/2019   Bipolar I disorder, most recent episode depressed (HCC) 12/30/2018   B12 deficiency 11/22/2018   Low iron 11/22/2018   History of normocytic normochromic anemia 11/21/2018   Iron  deficiency anemia secondary to inadequate dietary iron intake 11/21/2018   Multiple lung nodules on CT 04/19/2018   Borderline diabetes mellitus 08/10/2017   Paroxysmal supraventricular tachycardia (HCC) 09/27/2015   Benign essential HTN 06/22/2015   Combined fat and carbohydrate induced hyperlipemia 06/22/2015   Breathlessness on exertion 06/17/2015   Pure hypercholesterolemia 04/22/2015   Congestive heart failure with left ventricular systolic dysfunction (HCC) 03/24/2015   Acquired hypothyroidism 02/04/2015   Heart failure, systolic (HCC) 02/04/2015   Anxiety 12/28/2014   Allergic rhinitis 10/27/2014   Bipolar 1 disorder, depressed (HCC) 10/27/2014   Hypercholesterolemia without hypertriglyceridemia 10/27/2014   Depression, major, recurrent, moderate (HCC) 10/27/2014   Adult hypothyroidism 10/27/2014   GAD (generalized anxiety disorder) 10/27/2014   Essential (primary) hypertension 10/27/2014   Colon polyp 10/27/2014   Hypoparathyroidism (HCC) 02/14/2013    Past Surgical History:  Procedure Laterality Date   ANKLE SURGERY Right 2012   APPENDECTOMY     AUGMENTATION MAMMAPLASTY Bilateral    BREAST ENHANCEMENT SURGERY     COLONOSCOPY WITH PROPOFOL N/A 02/22/2015   Procedure: COLONOSCOPY WITH PROPOFOL;  Surgeon: Elnita Maxwell, MD;  Location: [facility] ENDOSCOPY;  Service: Endoscopy;  Laterality: N/A;   COLONOSCOPY WITH PROPOFOL N/A 08/08/2022   Procedure: COLONOSCOPY WITH PROPOFOL;  Surgeon: Regis Bill, MD;  Location: [facility] ENDOSCOPY;  Service: Endoscopy;  Laterality: N/A;   ESOPHAGOGASTRODUODENOSCOPY (EGD) WITH PROPOFOL N/A 02/22/2015   Procedure: ESOPHAGOGASTRODUODENOSCOPY (EGD) WITH PROPOFOL;  Surgeon: Addison Naegeli  Shelle Iron, MD;  Location: [facility] ENDOSCOPY;  Service: Endoscopy;  Laterality: N/A;   ESOPHAGOGASTRODUODENOSCOPY (EGD) WITH PROPOFOL N/A 02/11/2016   Procedure: ESOPHAGOGASTRODUODENOSCOPY (EGD) WITH PROPOFOL;  Surgeon: Scot Jun, MD;  Location: [facility] ENDOSCOPY;   Service: Endoscopy;  Laterality: N/A;   ESOPHAGOGASTRODUODENOSCOPY (EGD) WITH PROPOFOL N/A 08/08/2022   Procedure: ESOPHAGOGASTRODUODENOSCOPY (EGD) WITH PROPOFOL;  Surgeon: Regis Bill, MD;  Location: [facility] ENDOSCOPY;  Service: Endoscopy;  Laterality: N/A;   HEMORRHOID SURGERY     NASAL SINUS SURGERY     SAVORY DILATION N/A 02/22/2015   Procedure: SAVORY DILATION;  Surgeon: Elnita Maxwell, MD;  Location: [facility] ENDOSCOPY;  Service: Endoscopy;  Laterality: N/A;   SAVORY DILATION N/A 02/11/2016   Procedure: SAVORY DILATION;  Surgeon: Scot Jun, MD;  Location: [facility] ENDOSCOPY;  Service: Endoscopy;  Laterality: N/A;   SKIN CANCER EXCISION      OB History   No obstetric history on file.      Home Medications    Prior to Admission medications   Medication Sig Start Date End Date Taking? Authorizing Provider  azithromycin (ZITHROMAX) 250 MG tablet Take 1 tablet (250 mg total) by mouth daily. Take first 2 tablets together, then 1 every day until finished. 09/27/23  Yes Mickie Bail, NP  ALREX 0.2 % SUSP Apply 1 drop to eye 3 (three) times daily. 09/11/23   [provider]  benzonatate (TESSALON) 100 MG capsule Take 1 capsule (100 mg total) by mouth 3 (three) times daily as needed for cough. Patient not taking: Reported on 04/11/2023 04/08/23   Mickie Bail, NP  calcitRIOL (ROCALTROL) 0.25 MCG capsule Take 1 capsule (0.25 mcg total) by mouth daily. 07/25/23   Thapa, [place], MD  cyanocobalamin (VITAMIN B12) 1000 MCG/ML injection Inject into the muscle. 08/20/23 04/16/24  [provider]  escitalopram (LEXAPRO) 10 MG tablet Take 1.5 tablets (15 mg total) by mouth daily. 09/18/23   Jomarie Longs, MD  esomeprazole (NEXIUM) 40 MG capsule Take 1 capsule by mouth daily. 06/04/23   [provider]  EYSUVIS 0.25 % SUSP Instill ONE drop IN [facility] EYE THREE TIMES DAILY 07/24/23   [provider]  fenofibrate 160 MG tablet Take 1 tablet by mouth daily. 12/17/20   [provider]  FLUBLOK 0.5 ML SOSY Inject into the muscle. 05/24/23   [provider]  fluticasone Aleda Grana) 50 MCG/ACT nasal spray  12/25/12   [provider]  gabapentin (NEURONTIN) 300 MG capsule Take 300 mg by mouth. 2 CAP AM, 1 CAP Q noon, 2 CAP pm, 10/06/19   [provider]  ipratropium (ATROVENT) 0.03 % nasal spray Place 2 sprays into both nostrils every 12 (twelve) hours. 04/08/23   Mickie Bail, NP  isosorbide mononitrate (IMDUR) 30 MG 24 hr tablet  02/06/23   [provider]  lamoTRIgine (LAMICTAL) 200 MG tablet Take 0.5 tablets (100 mg total) by mouth 2 (two) times daily. 07/16/23   Jomarie Longs, MD  levothyroxine (SYNTHROID) 75 MCG tablet Take 1 tablet (75 mcg total) by mouth daily. 05/07/23   Thapa, [place], MD  lisinopril (ZESTRIL) 10 MG tablet Take 10 mg by mouth daily. 05/26/19   [provider]  lovastatin (MEVACOR) 20 MG tablet Take 20 mg by mouth at bedtime.    [provider]  prazosin (MINIPRESS) 2 MG capsule Take 1 capsule (2 mg total) by mouth at bedtime. 07/16/23   Jomarie Longs, MD  promethazine (PHENERGAN) 25 MG tablet Take 25 mg by mouth  every 6 (six) hours as needed for nausea or vomiting.    [provider]  propranolol ER (INDERAL LA) 80 MG 24 hr capsule Take 80 mg by mouth daily. 10/08/22   [provider]  temazepam (RESTORIL) 30 MG capsule Take 1 capsule (30 mg total) by mouth at bedtime. 09/18/23 12/17/23  Jomarie Longs, MD  VEVYE 0.1 % SOLN Apply 1 drop to eye 2 (two) times daily.    [provider]    Family History Family History  Problem Relation Age of Onset   Cancer Mother        colon cancer   Depression Mother    Anxiety disorder Mother    Heart disease Father    Cancer Father        ? neck cancer- still living.    Anxiety disorder Father    Depression Father    Hypoparathyroidism Son    Colon cancer Maternal Aunt        died in 29s.     Social History Social  History   Tobacco Use   Smoking status: Never   Smokeless tobacco: Never  Substance Use Topics   Alcohol use: Not Currently   Drug use: No     Allergies   Levofloxacin, Atorvastatin, Hydrochlorothiazide, Parafon forte dsc [chlorzoxazone], Pollen extract, Pseudoephedrine, and Sudafed [pseudoephedrine hcl]   Review of Systems Review of Systems  Constitutional:  Negative for chills and fever.  HENT:  Positive for congestion, postnasal drip, rhinorrhea and sinus pressure. Negative for ear pain and sore throat.   Respiratory:  Positive for cough. Negative for shortness of breath.   Neurological:  Positive for headaches.     Physical Exam Triage Vital Signs ED Triage Vitals  Encounter Vitals Group     BP      Systolic BP Percentile      Diastolic BP Percentile      Pulse      Resp      Temp      Temp src      SpO2      Weight      Height      Head Circumference      Peak Flow      Pain Score      Pain Loc      Pain Education      Exclude from Growth Chart    No data found.  Updated Vital Signs BP 133/74   Pulse 75   Temp 99 F (37.2 C)   Resp 18   SpO2 95%   Visual Acuity Right Eye Distance:   Left Eye Distance:   Bilateral Distance:    Right Eye Near:   Left Eye Near:    Bilateral Near:     Physical Exam Constitutional:      General: She is not in acute distress. HENT:     Right Ear: Tympanic membrane normal.     Left Ear: Tympanic membrane normal.     Nose: Rhinorrhea present.     Mouth/Throat:     Mouth: Mucous membranes are moist.     Pharynx: Oropharynx is clear.  Cardiovascular:     Rate and Rhythm: Normal rate and regular rhythm.     Heart sounds: Normal heart sounds.  Pulmonary:     Effort: Pulmonary effort is normal. No respiratory distress.     Breath sounds: Normal breath sounds.  Neurological:     General: No focal deficit present.  Mental Status: She is alert and oriented to person, place, and time.     Sensory: No sensory  deficit.     Motor: No weakness.     Gait: Gait normal.      UC Treatments / Results  Labs (all labs ordered are listed, but only abnormal results are displayed) Labs Reviewed - No data to display  EKG   Radiology No results found.  Procedures Procedures (including critical care time)  Medications Ordered in UC Medications - No data to display  Initial Impression / Assessment and Plan / UC Course  I have reviewed the triage vital signs and the nursing notes.  Pertinent labs & imaging results that were available during my care of the patient were reviewed by me and considered in my medical decision making (see chart for details).    Headache, sinus infection.  Afebrile, VSS.  Treating today with Zithromax as patient reports this is worked well for her in the past.  Instructed her to follow-up with her PCP tomorrow.  ED precautions given.  Education provided on headache and sinus infection.  She agrees to plan of care.  Final Clinical Impressions(s) / UC Diagnoses   Final diagnoses:  Acute nonintractable headache, unspecified headache type  Acute non-recurrent frontal sinusitis     Discharge Instructions      Take the Zithromax as directed.  Follow up with your primary care provider tomorrow.  Go to the emergency department if you have worsening symptoms.        ED Prescriptions     Medication Sig Dispense Auth. Provider   azithromycin (ZITHROMAX) 250 MG tablet Take 1 tablet (250 mg total) by mouth daily. Take first 2 tablets together, then 1 every day until finished. 6 tablet Mickie Bail, NP      PDMP not reviewed this encounter.   Mickie Bail, NP 09/27/23 [phone]

## 2023-10-19 ENCOUNTER — Telehealth: Admitting: Psychiatry

## 2023-10-19 ENCOUNTER — Encounter: Payer: Self-pay | Admitting: Psychiatry

## 2023-10-19 DIAGNOSIS — G2581 Restless legs syndrome: Secondary | ICD-10-CM

## 2023-10-19 DIAGNOSIS — F411 Generalized anxiety disorder: Secondary | ICD-10-CM | POA: Diagnosis not present

## 2023-10-19 DIAGNOSIS — G4701 Insomnia due to medical condition: Secondary | ICD-10-CM | POA: Diagnosis not present

## 2023-10-19 DIAGNOSIS — F3131 Bipolar disorder, current episode depressed, mild: Secondary | ICD-10-CM | POA: Diagnosis not present

## 2023-10-19 DIAGNOSIS — Z79899 Other long term (current) drug therapy: Secondary | ICD-10-CM

## 2023-10-19 DIAGNOSIS — R41840 Attention and concentration deficit: Secondary | ICD-10-CM

## 2023-10-19 MED ORDER — ROPINIROLE HCL 0.25 MG PO TABS: 0.2500 mg | ORAL_TABLET | Freq: Three times a day (TID) | ORAL | 1 refills | Status: DC

## 2023-10-19 NOTE — Progress Notes (Signed)
 Virtual Visit via Video Note  I connected with Kaylee Gordon on 10/19/23 at 11:20 AM EDT by a video enabled telemedicine application and verified that I am speaking with the correct person using two identifiers.  Location Provider Location : [facility] Patient Location : Home  Participants: Patient , Provider   I discussed the limitations of evaluation and management by telemedicine and the availability of in person appointments. The patient expressed understanding and agreed to proceed   I discussed the assessment and treatment plan with the patient. The patient was provided an opportunity to ask questions and all were answered. The patient agreed with the plan and demonstrated an understanding of the instructions.   The patient was advised to call back or seek an in-person evaluation if the symptoms worsen or if the condition fails to improve as anticipated.     BH MD OP Progress Note  10/19/2023 5:04 PM ELAN MCELVAIN  MRN:  [id number]  Chief Complaint:  Chief Complaint  Patient presents with   Follow-up   Depression   Anxiety   Medication Refill   Insomnia   HPI: Kaylee Gordon is a 63 year old Caucasian female who has a history of bipolar disorder, GAD, fibromyalgia, currently married, lives in [place] was evaluated by telemedicine today.  She has a history of bipolar disorder and generalized anxiety, with her anxiety currently being extremely high. She is seeing her therapist Inetta Fermo, although recent appointments were missed due to a sinus infection and Inetta Fermo moving her office. She is scheduled to see Inetta Fermo next week. She also reports depression, exacerbated by her inability to drive post-surgery, despite her husband and son assisting with transportation. Her current medications include Lexapro 15 mg, Lamictal 100 mg (half tablet twice a day), Minipress 2 mg, and temazepam 30 mg.  She was informed during her surgery that she has restless leg syndrome, which she was unaware  of, attributing her leg movements to a lifelong habit. She experiences agitation at night.She experiences difficulty sleeping, which is exacerbated by anxiety and restless leg symptoms. She has been using CBD oil.  She is recovering from cataract surgery, having had one eye operated on March 18th and the other on the previous Tuesday. Her eyes are still a little blurry, which she understands can last for a few days. She experienced a scratch in the first eye, which caused panic about the second surgery, but it has since healed.  She has a history of attention and focus issues and was referred for ADHD testing. She completed at-home testing and is awaiting further evaluation. She describes her attention span as requiring her to do two things at one time.  She is currently taking gabapentin for fibromyalgia, which she finds very helpful.  She denies any suicidality, homicidality or perceptual disturbances.  Visit Diagnosis:    ICD-10-CM   1. Bipolar 1 disorder, depressed, mild (HCC)  F31.31     2. GAD (generalized anxiety disorder)  F41.1     3. Insomnia due to medical condition  G47.01 rOPINIRole (REQUIP) 0.25 MG tablet   Anxiety,pain,RLS    4. RLS (restless legs syndrome)  G25.81 rOPINIRole (REQUIP) 0.25 MG tablet    5. Attention and concentration deficit  R41.840     6. High risk medication use  Z79.899 Urine drugs of abuse scrn w alc, routine (Ref Lab)      Past Psychiatric History: I have reviewed past psychiatric history from progress note on 09/11/2018.  Past trials of medications like Ambien, Lunesta,  Sonata, Belsomra.  Past Medical History:  Past Medical History:  Diagnosis Date   Anxiety    Asthma    B12 deficiency    Cancer (HCC)    SKIN CANCER   CHF (congestive heart failure) (HCC)    Depression    Hypertension    Thyroid disease     Past Surgical History:  Procedure Laterality Date   ANKLE SURGERY Right 2012   APPENDECTOMY     AUGMENTATION MAMMAPLASTY Bilateral     BREAST ENHANCEMENT SURGERY     COLONOSCOPY WITH PROPOFOL N/A 02/22/2015   Procedure: COLONOSCOPY WITH PROPOFOL;  Surgeon: Elnita Maxwell, MD;  Location: [facility] ENDOSCOPY;  Service: Endoscopy;  Laterality: N/A;   COLONOSCOPY WITH PROPOFOL N/A 08/08/2022   Procedure: COLONOSCOPY WITH PROPOFOL;  Surgeon: Regis Bill, MD;  Location: [facility] ENDOSCOPY;  Service: Endoscopy;  Laterality: N/A;   ESOPHAGOGASTRODUODENOSCOPY (EGD) WITH PROPOFOL N/A 02/22/2015   Procedure: ESOPHAGOGASTRODUODENOSCOPY (EGD) WITH PROPOFOL;  Surgeon: Elnita Maxwell, MD;  Location[facility] ENDOSCOPY;  Service: Endoscopy;  Laterality: N/A;   ESOPHAGOGASTRODUODENOSCOPY (EGD) WITH PROPOFOL N/A 02/11/2016   Procedure: ESOPHAGOGASTRODUODENOSCOPY (EGD) WITH PROPOFOL;  Surgeon: Scot Jun, MD;  Location: [facility] ENDOSCOPY;  Service: Endoscopy;  Laterality: N/A;   ESOPHAGOGASTRODUODENOSCOPY (EGD) WITH PROPOFOL N/A 08/08/2022   Procedure: ESOPHAGOGASTRODUODENOSCOPY (EGD) WITH PROPOFOL;  Surgeon: Regis Bill, MD;  Location: [facility] ENDOSCOPY;  Service: Endoscopy;  Laterality: N/A;   HEMORRHOID SURGERY     NASAL SINUS SURGERY     SAVORY DILATION N/A 02/22/2015   Procedure: SAVORY DILATION;  Surgeon: Elnita Maxwell, MD;  Location: [facility] ENDOSCOPY;  Service: Endoscopy;  Laterality: N/A;   SAVORY DILATION N/A 02/11/2016   Procedure: SAVORY DILATION;  Surgeon: Scot Jun, MD;  Location: [facility] ENDOSCOPY;  Service: Endoscopy;  Laterality: N/A;   SKIN CANCER EXCISION      Family Psychiatric History: I have reviewed family psychiatric history from progress note on 09/11/2018.  Family History:  Family History  Problem Relation Age of Onset   Cancer Mother        colon cancer   Depression Mother    Anxiety disorder Mother    Heart disease Father    Cancer Father        ? neck cancer- still living.    Anxiety disorder Father    Depression Father    Hypoparathyroidism Son    Colon cancer Maternal Aunt        died in  59s.     Social History: I have reviewed social history from progress note on 09/11/2018. Social History   Socioeconomic History   Marital status: Married    Spouse name: Casimiro Needle   Number of children: Not on file   Years of education: Not on file   Highest education level: Not on file  Occupational History   Not on file  Tobacco Use   Smoking status: Never   Smokeless tobacco: Never  Vaping Use   Vaping status: Not on file  Substance and Sexual Activity   Alcohol use: Not Currently   Drug use: No   Sexual activity: Not Currently    Partners: Male  Other Topics Concern   Not on file  Social History Narrative   Lives in [place]; taught high school- math; retd; no smoking; social alcohol.    Social Drivers of Corporate investment banker Strain: Low Risk  (08/07/2023)   Received from [facility]   Overall Financial Resource Strain (CARDIA)  Difficulty of Paying Living Expenses: Not hard at all  Food Insecurity: No Food Insecurity (08/07/2023)   Received from [facility]   Hunger Vital Sign    Worried About Running Out of Food in the Last Year: Never true    Ran Out of Food in the Last Year: Never true  Transportation Needs: No Transportation Needs (08/07/2023)   Received from [facility]   PRAPARE - Transportation    Lack of Transportation (Non-Medical): No    In the past 12 months, has lack of transportation kept you from medical appointments or from getting medications?: No  Physical Activity: Unknown (05/16/2018)   Exercise Vital Sign    Days of Exercise per Week: 0 days    Minutes of Exercise per Session: Not on file  Stress: Stress Concern Present (05/16/2018)   [employer] of Occupational Health - Occupational Stress Questionnaire    Feeling of Stress : Very much  Social Connections: Unknown (05/16/2018)   Social Connection and Isolation Panel [NHANES]    Frequency of Communication with Friends and  Family: Twice a week    Frequency of Social Gatherings with Friends and Family: Twice a week    Attends Religious Services: More than 4 times per year    Active Member of [employer] or Organizations: Not on file    Attends Banker Meetings: Not on file    Marital Status: Married    Allergies:  Allergies  Allergen Reactions   Levofloxacin Other (See Comments) and Nausea Only    Lethargic, Flu-like symptoms, Hot flashes    Atorvastatin    Hydrochlorothiazide Other (See Comments)    Intolerance - dry lips   Parafon Forte Dsc [Chlorzoxazone]    Pollen Extract    Pseudoephedrine Other (See Comments)   Sudafed [Pseudoephedrine Hcl]     Heart racing    Metabolic Disorder Labs: Lab Results  Component Value Date   HGBA1C 5.9 (H) 02/16/2021   Lab Results  Component Value Date   PROLACTIN 18.3 07/22/2014   Lab Results  Component Value Date   CHOL 184 02/16/2021   TRIG 179 (H) 02/16/2021   HDL 37 (L) 02/16/2021   CHOLHDL 5.0 (H) 02/16/2021   LDLCALC 115 (H) 02/16/2021   Lab Results  Component Value Date   TSH 2.03 07/24/2023   TSH 1.59 01/22/2023    Therapeutic Level Labs: No results found for: "LITHIUM" Lab Results  Component Value Date   VALPROATE 39 (L) 02/04/2019   No results found for: "CBMZ"  Current Medications: Current Outpatient Medications  Medication Sig Dispense Refill   BESIVANCE 0.6 % SUSP Apply 1 drop to eye 3 (three) times daily.     bimatoprost (LUMIGAN) 0.03 % ophthalmic solution SMARTSIG:In Eye(s)     Bromfenac Sodium 0.07 % SOLN Apply 1 drop to eye 2 (two) times daily.     Difluprednate 0.05 % EMUL INSTILL 1 DROP INTO OPERATIVE EYE THREE TIMES DAILY STARTING 3 DAYS PRIOR TO SURGERY AND CONTINUE FOR 1 WEEK AFTER SURGERY, THEN 2 TIMES A DAY FOR 1 WEEK, THEN 1 TIME A DAY FOR 1 WEEK. THEN STOP     rOPINIRole (REQUIP) 0.25 MG tablet Take 1 tablet (0.25 mg total) by mouth 3 (three) times daily. 90 tablet 1   ALREX 0.2 % SUSP Apply 1 drop to eye  3 (three) times daily.     azithromycin (ZITHROMAX) 250 MG tablet Take 1 tablet (250 mg total) by mouth daily. Take first 2  tablets together, then 1 every day until finished. 6 tablet 0   benzonatate (TESSALON) 100 MG capsule Take 1 capsule (100 mg total) by mouth 3 (three) times daily as needed for cough. (Patient not taking: Reported on 04/11/2023) 21 capsule 0   calcitRIOL (ROCALTROL) 0.25 MCG capsule Take 1 capsule (0.25 mcg total) by mouth daily. 90 capsule 3   cyanocobalamin (VITAMIN B12) 1000 MCG/ML injection Inject into the muscle.     escitalopram (LEXAPRO) 10 MG tablet Take 1.5 tablets (15 mg total) by mouth daily. 135 tablet 0   esomeprazole (NEXIUM) 40 MG capsule Take 1 capsule by mouth daily.     EYSUVIS 0.25 % SUSP Instill ONE drop IN [facility] EYE THREE TIMES DAILY     fenofibrate 160 MG tablet Take 1 tablet by mouth daily.     FLUBLOK 0.5 ML SOSY Inject into the muscle.     fluticasone (FLONASE) 50 MCG/ACT nasal spray      gabapentin (NEURONTIN) 300 MG capsule Take 300 mg by mouth. 2 CAP AM, 1 CAP Q noon, 2 CAP pm,     ipratropium (ATROVENT) 0.03 % nasal spray Place 2 sprays into both nostrils every 12 (twelve) hours. 30 mL 12   isosorbide mononitrate (IMDUR) 30 MG 24 hr tablet      lamoTRIgine (LAMICTAL) 200 MG tablet Take 0.5 tablets (100 mg total) by mouth 2 (two) times daily. 90 tablet 1   levothyroxine (SYNTHROID) 75 MCG tablet Take 1 tablet (75 mcg total) by mouth daily. 90 tablet 3   lisinopril (ZESTRIL) 10 MG tablet Take 10 mg by mouth daily.     lovastatin (MEVACOR) 20 MG tablet Take 20 mg by mouth at bedtime.     prazosin (MINIPRESS) 2 MG capsule Take 1 capsule (2 mg total) by mouth at bedtime. 90 capsule 1   promethazine (PHENERGAN) 25 MG tablet Take 25 mg by mouth every 6 (six) hours as needed for nausea or vomiting.     propranolol ER (INDERAL LA) 80 MG 24 hr capsule Take 80 mg by mouth daily.     temazepam (RESTORIL) 30 MG capsule Take 1 capsule (30 mg total) by mouth at  bedtime. 30 capsule 2   VEVYE 0.1 % SOLN Apply 1 drop to eye 2 (two) times daily.     No current facility-administered medications for this visit.     Musculoskeletal: Strength & Muscle Tone:  UTA Gait & Station:  seated Patient leans: N/A  Psychiatric Specialty Exam: Review of Systems  Psychiatric/Behavioral:  Positive for decreased concentration, dysphoric mood and sleep disturbance. The patient is nervous/anxious.     There were no vitals taken for this visit.There is no height or weight on file to calculate BMI.  General Appearance: Casual  Eye Contact:  Fair  Speech:  Clear and Coherent  Volume:  Normal  Mood:  Anxious, sadness due to current physical limitation  Affect:  Congruent  Thought Process:  Goal Directed and Descriptions of Associations: Intact  Orientation:  Full (Time, Place, and Person)  Thought Content: Logical   Suicidal Thoughts:  No  Homicidal Thoughts:  No  Memory:  Immediate;   Fair Recent;   Fair Remote;   Fair  Judgement:  Fair  Insight:  Good  Psychomotor Activity:  Normal  Concentration:  Concentration: Fair and Attention Span: Fair  Recall:  [employer] of Knowledge: Fair  Language: Fair  Akathisia:  No  Handed:  Right  AIMS (if indicated): not done  Assets:  Desire for Improvement Housing Social Support  ADL's:  Intact  Cognition: WNL  Sleep:  Poor   Screenings: AIMS    Flowsheet Row Video Visit from 12/08/2021 in [facility]  AIMS Total Score 0      GAD-7    Flowsheet Row Office Visit from 04/11/2023 in [facility] Office Visit from 11/21/2022 in [facility] Video Visit from 03/28/2022 in [facility] Video Visit from 02/06/2022 in [facility] Video Visit from 01/24/2022 in [facility]  Total  GAD-7 Score 21 14 10 7 10       PHQ2-9    Flowsheet Row Office Visit from 04/11/2023 in [facility] Office Visit from 11/21/2022 in [facility] Video Visit from 06/27/2022 in [facility] Video Visit from 03/28/2022 in [facility] Video Visit from 02/06/2022 in [facility]  PHQ-2 Total Score 2 2 0 0 0  PHQ-9 Total Score 11 11 -- -- --      Flowsheet Row Video Visit from 10/19/2023 in [facility] ED from 09/27/2023 in [facility]  Video Visit from 09/18/2023 in [facility]  C-SSRS RISK CATEGORY No Risk No Risk No Risk        Assessment and Plan: Kaylee Gordon is a 63 year old Caucasian female who has a history of bipolar disorder, generalized anxiety disorder, insomnia was evaluated by telemedicine today.  Discussed assessment and plan as noted below.  Generalized Anxiety Disorder-unstable Extremely high anxiety levels persist despite Lexapro 15 mg. Dosage increase is restricted due to prolonged QT history, pending cardiology clearance. Therapy with Inetta Fermo is ongoing but recent sessions were missed due to illness and surgery. Management options are limited by cardiac concerns, awaiting cardiology evaluation. - Continue Lexapro 15 mg daily - Encourage continuation of therapy - Coordinate with cardiology for potential Lexapro dosage adjustment  Bipolar Disorder most recent episode depressed mild-unstable Bipolar disorder with current mood symptoms of high anxiety and depression, exacerbated by recent health issues and inability to drive post-cataract surgery. - Continue Lamictal 200 mg daily - Continue Lexapro 15 mg daily - Adjust treatment based on mood symptom  monitoring  Insomnia-unstable Experiences difficulty sleeping and nighttime agitation. CBD oil use has been beneficial but is contraindicated due to potential THC contamination affecting temazepam prescription. Urine drug screen required to continue temazepam. - Discontinue CBD oil use - Continue Temazepam 30 mg at bedtime as prescribed - Continue Prazosin 2 mg at bedtime for nightmares. - Order urine drug screen at [facility] lab post-cataract recovery - Reviewed [place] PMP AWARxE  Restless Leg Syndrome-unstable Significant agitation and sleep difficulty potentially exacerbated by restless leg symptoms. Gabapentin for fibromyalgia may aid symptoms. Initiating Requip (ropinirole) to address restless leg symptoms. - Initiate Requip (ropinirole) 0.25 mg three times a day - Provide Requip education and include information in after-visit summary - Also on Gabapentin for fibromyalgia.  Attention and concentration deficit-patient has been referred to Dr. Kieth Brightly, pending report.  Follow-up Follow-up in clinic in 4 weeks or sooner if needed.  Collaboration of Care: Collaboration of Care: Referral or follow-up with counselor/therapist AEB encouraged to continue CBT as well as follow up with cardiology for history of prolonged QT.  May need to repeat EKG  prior to making more adjustment with psychotropics.  Patient/Guardian was advised Release of Information must be obtained prior to any record release in order to collaborate their care with an outside provider. Patient/Guardian was advised if they have not already done so to contact the registration department to sign all necessary forms in order for [place] to release information regarding their care.   Consent: Patient/Guardian gives verbal consent for treatment and assignment of benefits for services provided during this visit. Patient/Guardian expressed understanding and agreed to proceed.   This note was generated in part or whole with voice recognition  software. Voice recognition is usually quite accurate but there are transcription errors that can and very often do occur. I apologize for any typographical errors that were not detected and corrected.   Discussed the use of a AI scribe software for clinical note transcription with the patient, who gave verbal consent to proceed.   Jomarie Longs, MD 10/19/2023, 5:04 PM

## 2023-11-29 ENCOUNTER — Other Ambulatory Visit
Admission: RE | Admit: 2023-11-29 | Discharge: 2023-11-29 | Disposition: A | Discharge status: Home / Self Care | Source: Ambulatory Visit | Attending: Psychiatry | Admitting: Psychiatry

## 2023-11-29 DIAGNOSIS — Z79899 Other long term (current) drug therapy: Secondary | ICD-10-CM | POA: Diagnosis present

## 2023-12-03 ENCOUNTER — Telehealth: Payer: Self-pay | Admitting: Psychiatry

## 2023-12-03 NOTE — Telephone Encounter (Signed)
 I have reviewed EKG dated Nov 16, 2023, normal sinus rhythm, QTc-469.

## 2023-12-04 LAB — URINE DRUGS OF ABUSE SCREEN W ALC, ROUTINE (REF LAB)
Amphetamines, Urine: NEGATIVE ng/mL
Barbiturate, Ur: NEGATIVE ng/mL
Cannabinoid Quant, Ur: NEGATIVE ng/mL
Cocaine (Metab.): NEGATIVE ng/mL
Creatinine, Urine: 202.2 mg/dL (ref 20.0–300.0)
Ethanol U, Quan: NEGATIVE %
Methadone Screen, Urine: NEGATIVE ng/mL
Nitrite Urine, Quantitative: NEGATIVE ug/mL
OPIATE SCREEN URINE: NEGATIVE ng/mL
Phencyclidine, Ur: NEGATIVE ng/mL
Propoxyphene, Urine: NEGATIVE ng/mL
pH, Urine: 5.4 (ref 4.5–8.9)

## 2023-12-04 LAB — DRUG PROFILE 799031
BENZODIAZEPINES: POSITIVE — AB
HYDROXYALPRAZOLAM: NEGATIVE
NORDIAZEPAM: NEGATIVE
Oxazepam Gc/Ms Conf, Ur: 5913 ng/mL
Oxazepam: POSITIVE — AB

## 2023-12-06 ENCOUNTER — Ambulatory Visit: Payer: Self-pay | Admitting: Psychiatry

## 2023-12-07 ENCOUNTER — Encounter: Payer: Self-pay | Admitting: Psychiatry

## 2023-12-07 ENCOUNTER — Telehealth (INDEPENDENT_AMBULATORY_CARE_PROVIDER_SITE_OTHER): Admitting: Psychiatry

## 2023-12-07 DIAGNOSIS — F3131 Bipolar disorder, current episode depressed, mild: Secondary | ICD-10-CM

## 2023-12-07 DIAGNOSIS — G2581 Restless legs syndrome: Secondary | ICD-10-CM

## 2023-12-07 DIAGNOSIS — R41840 Attention and concentration deficit: Secondary | ICD-10-CM

## 2023-12-07 DIAGNOSIS — F411 Generalized anxiety disorder: Secondary | ICD-10-CM | POA: Diagnosis not present

## 2023-12-07 DIAGNOSIS — G4701 Insomnia due to medical condition: Secondary | ICD-10-CM | POA: Diagnosis not present

## 2023-12-07 DIAGNOSIS — Z9189 Other specified personal risk factors, not elsewhere classified: Secondary | ICD-10-CM

## 2023-12-07 MED ORDER — ESCITALOPRAM OXALATE 10 MG PO TABS: 15.0000 mg | ORAL_TABLET | Freq: Every day | ORAL | 0 refills | Status: DC

## 2023-12-07 MED ORDER — BUSPIRONE HCL 10 MG PO TABS: 10.0000 mg | ORAL_TABLET | ORAL | 1 refills | Status: DC

## 2023-12-07 MED ORDER — BUSPIRONE HCL 10 MG PO TABS: 40.0000 mg | ORAL_TABLET | ORAL | 1 refills | Status: DC

## 2023-12-07 MED ORDER — TEMAZEPAM 30 MG PO CAPS: 30.0000 mg | ORAL_CAPSULE | Freq: Every day | ORAL | 2 refills | Status: DC

## 2023-12-07 NOTE — Patient Instructions (Signed)
 Please call for [phone]

## 2023-12-07 NOTE — Progress Notes (Signed)
 Virtual Visit via Video Note  I connected with Kaylee Gordon on 12/07/23 at  9:20 AM EDT by a video enabled telemedicine application and verified that I am speaking with the correct person using two identifiers.  Location Provider Location : [facility] Patient Location : Home  Participants: Patient , Provider    I discussed the limitations of evaluation and management by telemedicine and the availability of in person appointments. The patient expressed understanding and agreed to proceed.   I discussed the assessment and treatment plan with the patient. The patient was provided an opportunity to ask questions and all were answered. The patient agreed with the plan and demonstrated an understanding of the instructions.   The patient was advised to call back or seek an in-person evaluation if the symptoms worsen or if the condition fails to improve as anticipated.   BH MD OP Progress Note  12/07/2023 1:03 PM Kaylee Gordon  MRN:  [id number]  Chief Complaint:  Chief Complaint  Patient presents with   Follow-up   Depression   Anxiety   Medication Refill   Discussed the use of AI scribe software for clinical note transcription with the patient, who gave verbal consent to proceed.  History of Present Illness Kaylee Gordon is a 63 year old Caucasian female who has a history of bipolar disorder, GAD, fibromyalgia, married, lives in [place] was evaluated by telemedicine today.    She experiences very high anxiety, feeling nervous and on edge, and constantly anticipates something going wrong. Much of her anxiety is attributed to situational stressors at home, including conflicts between her children and longstanding issues with her husband. May (the month) has been particularly difficult due to Mother's Day, as her mother passed away in 1995-12-22.  Having no control over her anxiety in turn leads to sadness, low motivation, mood swings.  She also has a history of bipolar disorder and  reports mood symptoms as unmanaged on the current medication regimen.  Her current medications include Lexapro  15 mg, lamotrigine  200 mg, temazepam  30 mg at bedtime, and ropinirole  0.25 mg. She also takes gabapentin  for fibromyalgia. Ropinirole  has improved her restless leg symptoms at night, slightly improving her sleep.  She engages in therapy and last spoke with her therapist, Brian Campanile, one day last week, with another appointment scheduled for June 2nd. She is working on addressing the root causes of her anxiety in therapy.  She recently underwent testing at [facility], which indicated she does not have ADHD but rather extremely high anxiety. She is awaiting the official report to be sent .  Denies thoughts of self-harm or harm to others.    Visit Diagnosis:    ICD-10-CM   1. Bipolar 1 disorder, depressed, mild (HCC)  F31.31 busPIRone (BUSPAR) 10 MG tablet    DISCONTINUED: busPIRone (BUSPAR) 10 MG tablet    2. GAD (generalized anxiety disorder)  F41.1 busPIRone (BUSPAR) 10 MG tablet    escitalopram  (LEXAPRO ) 10 MG tablet    DISCONTINUED: busPIRone (BUSPAR) 10 MG tablet    3. Insomnia due to medical condition  G47.01 temazepam  (RESTORIL ) 30 MG capsule   anxiety, pain,RLS    4. RLS (restless legs syndrome)  G25.81     5. Attention and concentration deficit  R41.840     6. At risk for prolonged QT interval syndrome  Z91.89 EKG 12-Lead      Past Psychiatric History: I have reviewed past psychiatric history from progress note on 09/11/2018.  Past trials of medications  like Ambien, Lunesta , Sonata , Belsomra .  Past Medical History:  Past Medical History:  Diagnosis Date   Anxiety    Asthma    B12 deficiency    Cancer (HCC)    SKIN CANCER   CHF (congestive heart failure) (HCC)    Depression    Hypertension    Thyroid  disease     Past Surgical History:  Procedure Laterality Date   ANKLE SURGERY Right 2012   APPENDECTOMY     AUGMENTATION MAMMAPLASTY Bilateral     BREAST ENHANCEMENT SURGERY     COLONOSCOPY WITH PROPOFOL  N/A 02/22/2015   Procedure: COLONOSCOPY WITH PROPOFOL ;  Surgeon: Luella Sager, MD;  Location: [facility] ENDOSCOPY;  Service: Endoscopy;  Laterality: N/A;   COLONOSCOPY WITH PROPOFOL  N/A 08/08/2022   Procedure: COLONOSCOPY WITH PROPOFOL ;  Surgeon: Shane Darling, MD;  Location: [facility] ENDOSCOPY;  Service: Endoscopy;  Laterality: N/A;   ESOPHAGOGASTRODUODENOSCOPY (EGD) WITH PROPOFOL  N/A 02/22/2015   Procedure: ESOPHAGOGASTRODUODENOSCOPY (EGD) WITH PROPOFOL ;  Surgeon: Luella Sager, MD;  Location: [facility] ENDOSCOPY;  Service: Endoscopy;  Laterality: N/A;   ESOPHAGOGASTRODUODENOSCOPY (EGD) WITH PROPOFOL  N/A 02/11/2016   Procedure: ESOPHAGOGASTRODUODENOSCOPY (EGD) WITH PROPOFOL ;  Surgeon: Cassie Click, MD;  Location: [facility] ENDOSCOPY;  Service: Endoscopy;  Laterality: N/A;   ESOPHAGOGASTRODUODENOSCOPY (EGD) WITH PROPOFOL  N/A 08/08/2022   Procedure: ESOPHAGOGASTRODUODENOSCOPY (EGD) WITH PROPOFOL ;  Surgeon: Shane Darling, MD;  Location: [facility] ENDOSCOPY;  Service: Endoscopy;  Laterality: N/A;   HEMORRHOID SURGERY     NASAL SINUS SURGERY     SAVORY DILATION N/A 02/22/2015   Procedure: SAVORY DILATION;  Surgeon: Luella Sager, MD;  Location: [facility] ENDOSCOPY;  Service: Endoscopy;  Laterality: N/A;   SAVORY DILATION N/A 02/11/2016   Procedure: SAVORY DILATION;  Surgeon: Cassie Click, MD;  Location: [facility] ENDOSCOPY;  Service: Endoscopy;  Laterality: N/A;   SKIN CANCER EXCISION      Family Psychiatric History: I have reviewed family psychiatric history from progress note on 09/11/2018.  Family History:  Family History  Problem Relation Age of Onset   Cancer Mother        colon cancer   Depression Mother    Anxiety disorder Mother    Heart disease Father    Cancer Father        ? neck cancer- still living.    Anxiety disorder Father    Depression Father    Hypoparathyroidism Son    Colon cancer Maternal Aunt        died in  39s.     Social History: I have reviewed social history from progress note on 09/11/2018. Social History   Socioeconomic History   Marital status: Married    Spouse name: Bambi Lever   Number of children: Not on file   Years of education: Not on file   Highest education level: Not on file  Occupational History   Not on file  Tobacco Use   Smoking status: Never   Smokeless tobacco: Never  Vaping Use   Vaping status: Not on file  Substance and Sexual Activity   Alcohol use: Not Currently   Drug use: No   Sexual activity: Not Currently    Partners: Male  Other Topics Concern   Not on file  Social History Narrative   Lives in [place]; taught high school- math; retd; no smoking; social alcohol.    Social Drivers of Corporate investment banker Strain: Low Risk  (08/07/2023)   Received from [facility]   Overall Financial Resource Strain (  CARDIA)    Difficulty of Paying Living Expenses: Not hard at all  Food Insecurity: No Food Insecurity (08/07/2023)   [facility]   Hunger Vital Sign    Worried About Running Out of Food in the Last Year: Never true    Ran Out of Food in the Last Year: Never true  Transportation Needs: No Transportation Needs (08/07/2023)   Received [facility]   PRAPARE - Transportation    Lack of Transportation (Non-Medical): No    In the past 12 months, has lack of transportation kept you from medical appointments or from getting medications?: No  Physical Activity: Unknown (05/16/2018)   Exercise Vital Sign    Days of Exercise per Week: 0 days    Minutes of Exercise per Session: Not on file  Stress: Stress Concern Present (05/16/2018)   [employer] of Occupational Health - Occupational Stress Questionnaire    Feeling of Stress : Very much  Social Connections: Unknown (05/16/2018)   Social Connection and Isolation Panel [NHANES]    Frequency of Communication with Friends and  Family: Twice a week    Frequency of Social Gatherings with Friends and Family: Twice a week    Attends Religious Services: More than 4 times per year    Active Member of [employer] or Organizations: Not on file    Attends Banker Meetings: Not on file    Marital Status: Married    Allergies:  Allergies  Allergen Reactions   Levofloxacin Other (See Comments) and Nausea Only    Lethargic, Flu-like symptoms, Hot flashes    Atorvastatin    Hydrochlorothiazide Other (See Comments)    Intolerance - dry lips   Parafon Forte Dsc [Chlorzoxazone]    Pollen Extract    Pseudoephedrine Other (See Comments)   Sudafed [Pseudoephedrine Hcl]     Heart racing    Metabolic Disorder Labs: Lab Results  Component Value Date   HGBA1C 5.9 (H) 02/16/2021   Lab Results  Component Value Date   PROLACTIN 18.3 07/22/2014   Lab Results  Component Value Date   CHOL 184 02/16/2021   TRIG 179 (H) 02/16/2021   HDL 37 (L) 02/16/2021   CHOLHDL 5.0 (H) 02/16/2021   LDLCALC 115 (H) 02/16/2021   Lab Results  Component Value Date   TSH 2.03 07/24/2023   TSH 1.59 01/22/2023    Therapeutic Level Labs: No results found for: "LITHIUM" Lab Results  Component Value Date   VALPROATE 39 (L) 02/04/2019   No results found for: "CBMZ"  Current Medications: Current Outpatient Medications  Medication Sig Dispense Refill   ALREX 0.2 % SUSP Apply 1 drop to eye 3 (three) times daily.     azithromycin  (ZITHROMAX ) 250 MG tablet Take 1 tablet (250 mg total) by mouth daily. Take first 2 tablets together, then 1 every day until finished. 6 tablet 0   benzonatate  (TESSALON ) 100 MG capsule Take 1 capsule (100 mg total) by mouth 3 (three) times daily as needed for cough. (Patient not taking: Reported on 04/11/2023) 21 capsule 0   BESIVANCE 0.6 % SUSP Apply 1 drop to eye 3 (three) times daily.     bimatoprost (LUMIGAN) 0.03 % ophthalmic solution SMARTSIG:In Eye(s)     Bromfenac Sodium 0.07 % SOLN Apply 1 drop  to eye 2 (two) times daily.     busPIRone (BUSPAR) 10 MG tablet Take 4 tablets (40 mg total) by mouth as directed. Take 1 tablet three times a  day , could take an additional 10 mg daily as needed for severe anxiety 120 tablet 1   calcitRIOL  (ROCALTROL ) 0.25 MCG capsule Take 1 capsule (0.25 mcg total) by mouth daily. 90 capsule 3   cyanocobalamin (VITAMIN B12) 1000 MCG/ML injection Inject into the muscle.     Difluprednate 0.05 % EMUL INSTILL 1 DROP INTO OPERATIVE EYE THREE TIMES DAILY STARTING 3 DAYS PRIOR TO SURGERY AND CONTINUE FOR 1 WEEK AFTER SURGERY, THEN 2 TIMES A DAY FOR 1 WEEK, THEN 1 TIME A DAY FOR 1 WEEK. THEN STOP     escitalopram  (LEXAPRO ) 10 MG tablet Take 1.5 tablets (15 mg total) by mouth daily. 135 tablet 0   esomeprazole (NEXIUM) 40 MG capsule Take 1 capsule by mouth daily.     EYSUVIS 0.25 % SUSP Instill ONE drop IN EACH EYE THREE TIMES DAILY     fenofibrate 160 MG tablet Take 1 tablet by mouth daily.     FLUBLOK 0.5 ML SOSY Inject into the muscle.     fluticasone (FLONASE) 50 MCG/ACT nasal spray      gabapentin  (NEURONTIN ) 300 MG capsule Take 300 mg by mouth. 2 CAP AM, 1 CAP Q noon, 2 CAP pm,     ipratropium (ATROVENT ) 0.03 % nasal spray Place 2 sprays into both nostrils every 12 (twelve) hours. 30 mL 12   isosorbide mononitrate (IMDUR) 30 MG 24 hr tablet      lamoTRIgine  (LAMICTAL ) 200 MG tablet Take 0.5 tablets (100 mg total) by mouth 2 (two) times daily. 90 tablet 1   levothyroxine  (SYNTHROID ) 75 MCG tablet Take 1 tablet (75 mcg total) by mouth daily. 90 tablet 3   lisinopril (ZESTRIL) 10 MG tablet Take 10 mg by mouth daily.     lovastatin (MEVACOR) 20 MG tablet Take 20 mg by mouth at bedtime.     prazosin  (MINIPRESS ) 2 MG capsule Take 1 capsule (2 mg total) by mouth at bedtime. 90 capsule 1   promethazine (PHENERGAN) 25 MG tablet Take 25 mg by mouth every 6 (six) hours as needed for nausea or vomiting.     propranolol ER (INDERAL LA) 80 MG 24 hr capsule Take 80 mg by mouth  daily.     rOPINIRole  (REQUIP ) 0.25 MG tablet Take 1 tablet (0.25 mg total) by mouth 3 (three) times daily. 90 tablet 1   temazepam  (RESTORIL ) 30 MG capsule Take 1 capsule (30 mg total) by mouth at bedtime. 30 capsule 2   VEVYE 0.1 % SOLN Apply 1 drop to eye 2 (two) times daily.     No current facility-administered medications for this visit.     Musculoskeletal: Strength & Muscle Tone: UTA Gait & Station: Seated Patient leans: N/A  Psychiatric Specialty Exam: Review of Systems  Psychiatric/Behavioral:  Positive for dysphoric mood and sleep disturbance. The patient is nervous/anxious.        Mood swings    There were no vitals taken for this visit.There is no height or weight on file to calculate BMI.  General Appearance: Casual  Eye Contact:  Good  Speech:  Clear and Coherent  Volume:  Normal  Mood:  Anxious and Depressed  Affect:  Congruent  Thought Process:  Goal Directed and Descriptions of Associations: Intact  Orientation:  Full (Time, Place, and Person)  Thought Content: Logical   Suicidal Thoughts:  No  Homicidal Thoughts:  No  Memory:  Immediate;   Fair Recent;   Fair Remote;   Fair  Judgement:  Fair  Insight:  Fair  Psychomotor Activity:  Normal  Concentration:  Concentration: Fair and Attention Span: Fair  Recall:  [employer] of Knowledge: Fair  Language: Fair  Akathisia:  No  Handed:  Right  AIMS (if indicated): not done  Assets:  Communication Skills Desire for Improvement Housing Social Support Talents/Skills Transportation  ADL's:  Intact  Cognition: WNL  Sleep:  Improving   Screenings: AIMS    Flowsheet Row Video Visit from 12/08/2021 in [facility]  AIMS Total Score 0      GAD-7    Flowsheet Row Office Visit from 04/11/2023 in [facility] Office Visit from 11/21/2022 in [facility] Video Visit from 03/28/2022 in [facility] Video Visit from 02/06/2022 in [facility] Video Visit from 01/24/2022 in [facility]  Total GAD-7 Score 21 14 10 7 10       PHQ2-9    Flowsheet Row Office Visit from 04/11/2023 in [facility] Office Visit from 11/21/2022 in [facility] Video Visit from 06/27/2022 in [facility] Video Visit from 03/28/2022 in [facility] Video Visit from 02/06/2022 in [facility]  PHQ-2 Total Score 2 2 0 0 0  PHQ-9 Total Score 11 11 -- -- --      Flowsheet Row Video Visit from 12/07/2023 in [facility] Video Visit from 10/19/2023 in [facility] UC from 09/27/2023 in [facility]   C-SSRS RISK CATEGORY No Risk No Risk No Risk        Assessment and Plan: Kaylee Gordon is a 63 year old Caucasian female who has a history of bipolar disorder, GAD, insomnia was evaluated by telemedicine today.  Discussed assessment and plan as noted below.  Generalized anxiety disorder-unstable Continues to have anxiety symptoms exacerbated by medical problems as well as situational stressors.  - Recently completed EKG, reviewed with patient. - Continue Lexapro  15 mg daily - Add BuSpar 30 mg daily in divided dosage with option to take an extra 10 mg as needed for anxiety daily. - Continue psychotherapy sessions with Ms. Quirino Buckles. - Continue to coordinate with cardiology regarding Lexapro /cardiac effect.  Bipolar disorder most recent episode depressed mild-unstable Continues to have mood symptoms exacerbated by situational stressors and medical illness including pain. - Continue Lamotrigine  200  mg daily.  Will consider dosage increase in the future. - Continue Lexapro  15 mg daily - Continue psychotherapy sessions.  Insomnia-improving Continues to have sleep problems exacerbated by pain. - Continue Temazepam  30 mg at bedtime - Continue Prazosin  2 mg at bedtime for nightmares - Reviewed and discussed urine drug screen-dated 11/29/2023-positive for benzodiazepines otherwise negative.  Restless leg syndrome-improving Reports improvement of restless leg symptoms on the addition of Requip . - Continue Requip  0.25 mg 3 times a day. - Also on Gabapentin  for fibromyalgia.  Attention and concentration deficit-completed testing at [facility].  Patient to sign an ROI to obtain medical records.  At risk for prolonged QT syndrome-EKG reviewed and discussed dated 11/16/2023-normal sinus rhythm, QTc-469.  Will repeat EKG after initiation of BuSpar for a week.  Patient to call [phone].  EKG ordered.  Follow-up Follow-up in clinic in 4 weeks or sooner if needed in person.  Collaboration of Care: Collaboration of  Care: Referral or follow-up with counselor/therapist AEB encouraged to continue to follow up with therapist and Other continue cardiology follow-ups as needed  Patient/Guardian was advised Release of Information must be obtained prior to any record release in order to collaborate their care with an outside provider. Patient/Guardian was advised if they have not already done so to contact the registration department to sign all necessary forms in order for us  to release information regarding their care.   Consent: Patient/Guardian gives verbal consent for treatment and assignment of benefits for services provided during this visit. Patient/Guardian expressed understanding and agreed to proceed.   This note was generated in part or whole with voice recognition software. Voice recognition is usually quite accurate but there are transcription errors that can and very often  do occur. I apologize for any typographical errors that were not detected and corrected.    Ellena Kamen, MD 12/07/2023, 1:03 PM

## 2023-12-11 ENCOUNTER — Ambulatory Visit
Admission: RE | Admit: 2023-12-11 | Discharge: 2023-12-11 | Disposition: A | Discharge status: Home / Self Care | Source: Ambulatory Visit | Attending: Emergency Medicine | Admitting: Emergency Medicine

## 2023-12-11 VITALS — BP 130/61 | HR 78 | Temp 97.8°F | Resp 18

## 2023-12-11 DIAGNOSIS — L299 Pruritus, unspecified: Secondary | ICD-10-CM

## 2023-12-11 MED ORDER — PREDNISONE 10 MG (21) PO TBPK: ORAL_TABLET | Freq: Every day | ORAL | 0 refills | Status: DC

## 2023-12-11 NOTE — ED Provider Notes (Signed)
 Kaylee Gordon    CSN: [id number] Arrival date & time: 12/11/23  1550      History   Chief Complaint Chief Complaint  Patient presents with   Allergic Reaction    Itching - Entered by patient    HPI Kaylee Gordon is a 63 y.o. female.  Patient presents with intense skin itching on her trunk and extremities x 2 days, worse last night.  No rash, fever, chills, difficulty swallowing, shortness of breath, abdominal pain.  No new products, medications, foods.  Patient states the itching was so intense last night that she could not sleep.  She took 5 tablets of Benadryl last night which helped with the itching and allowed her to sleep.  The history is provided by the patient and medical records.    Past Medical History:  Diagnosis Date   Anxiety    Asthma    B12 deficiency    Cancer (HCC)    SKIN CANCER   CHF (congestive heart failure) (HCC)    Depression    Hypertension    Thyroid  disease     Patient Active Problem List   Diagnosis Date Noted   RLS (restless legs syndrome) 10/19/2023   High risk medication use 10/19/2023   Attention and concentration deficit 07/16/2023   Closed fracture of proximal phalanx of great toe 04/03/2023   Closed fracture of fifth metatarsal bone 03/13/2023   Arthritis of left knee 12/07/2022   Medicare annual wellness visit, initial 03/27/2022   History of prolonged Q-T interval on ECG 01/24/2022   Trochanteric bursitis of left hip 01/20/2022   Bereavement 10/03/2021   Bipolar 1 disorder, depressed, moderate (HCC) 10/03/2021   Elevated blood pressure reading 06/06/2021   Prolonged Q-T interval on ECG 06/02/2021   Prolonged QT syndrome 06/01/2021   Bipolar 1 disorder, depressed, full remission (HCC) 05/24/2021   Bipolar 1 disorder, depressed, partial remission (HCC) 02/10/2021   History of colonic polyps 08/16/2020   CKD (chronic kidney disease) stage 3, GFR 30-59 ml/min (HCC) 08/16/2020   Insomnia due to medical condition  06/25/2020   At risk for prolonged QT interval syndrome 06/25/2020   Skull lesion 02/24/2020   Tremor 05/08/2019   Bipolar I disorder, most recent episode depressed (HCC) 12/30/2018   B12 deficiency 11/22/2018   Low iron 11/22/2018   History of normocytic normochromic anemia 11/21/2018   Iron deficiency anemia secondary to inadequate dietary iron intake 11/21/2018   Multiple lung nodules on CT 04/19/2018   Borderline diabetes mellitus 08/10/2017   Paroxysmal supraventricular tachycardia (HCC) 09/27/2015   Benign essential HTN 06/22/2015   Combined fat and carbohydrate induced hyperlipemia 06/22/2015   Breathlessness on exertion 06/17/2015   Pure hypercholesterolemia 04/22/2015   Congestive heart failure with left ventricular systolic dysfunction (HCC) 03/24/2015   Acquired hypothyroidism 02/04/2015   Heart failure, systolic (HCC) 02/04/2015   Anxiety 12/28/2014   Allergic rhinitis 10/27/2014   Bipolar 1 disorder, depressed (HCC) 10/27/2014   Hypercholesterolemia without hypertriglyceridemia 10/27/2014   Depression, major, recurrent, moderate (HCC) 10/27/2014   Adult hypothyroidism 10/27/2014   GAD (generalized anxiety disorder) 10/27/2014   Essential (primary) hypertension 10/27/2014   Colon polyp 10/27/2014   Hypoparathyroidism (HCC) 02/14/2013    Past Surgical History:  Procedure Laterality Date   ANKLE SURGERY Right 2012   APPENDECTOMY     AUGMENTATION MAMMAPLASTY Bilateral    BREAST ENHANCEMENT SURGERY     COLONOSCOPY WITH PROPOFOL  N/A 02/22/2015   Procedure: COLONOSCOPY WITH PROPOFOL ;  Surgeon: Luella Sager, MD;  Location: [facility] ENDOSCOPY;  Service: Endoscopy;  Laterality: N/A;   COLONOSCOPY WITH PROPOFOL  N/A 08/08/2022   Procedure: COLONOSCOPY WITH PROPOFOL ;  Surgeon: Shane Darling, MD;  Location: [facility] ENDOSCOPY;  Service: Endoscopy;  Laterality: N/A;   ESOPHAGOGASTRODUODENOSCOPY (EGD) WITH PROPOFOL  N/A 02/22/2015   Procedure: ESOPHAGOGASTRODUODENOSCOPY (EGD)  WITH PROPOFOL ;  Surgeon: Luella Sager, MD;  Location: [facility] ENDOSCOPY;  Service: Endoscopy;  Laterality: N/A;   ESOPHAGOGASTRODUODENOSCOPY (EGD) WITH PROPOFOL  N/A 02/11/2016   Procedure: ESOPHAGOGASTRODUODENOSCOPY (EGD) WITH PROPOFOL ;  Surgeon: Cassie Click, MD;  Location: [facility] ENDOSCOPY;  Service: Endoscopy;  Laterality: N/A;   ESOPHAGOGASTRODUODENOSCOPY (EGD) WITH PROPOFOL  N/A 08/08/2022   Procedure: ESOPHAGOGASTRODUODENOSCOPY (EGD) WITH PROPOFOL ;  Surgeon: Shane Darling, MD;  Location: [facility] ENDOSCOPY;  Service: Endoscopy;  Laterality: N/A;   HEMORRHOID SURGERY     NASAL SINUS SURGERY     SAVORY DILATION N/A 02/22/2015   Procedure: SAVORY DILATION;  Surgeon: Luella Sager, MD;  Location: [facility] ENDOSCOPY;  Service: Endoscopy;  Laterality: N/A;   SAVORY DILATION N/A 02/11/2016   Procedure: SAVORY DILATION;  Surgeon: Cassie Click, MD;  Location: [facility] ENDOSCOPY;  Service: Endoscopy;  Laterality: N/A;   SKIN CANCER EXCISION      OB History   No obstetric history on file.      Home Medications    Prior to Admission medications   Medication Sig Start Date End Date Taking? Authorizing Provider  predniSONE (STERAPRED UNI-PAK 21 TAB) 10 MG (21) TBPK tablet Take by mouth daily. As directed 12/11/23  Yes Wellington Half, NP  ALREX 0.2 % SUSP Apply 1 drop to eye 3 (three) times daily. 09/11/23   [provider]  azithromycin  (ZITHROMAX ) 250 MG tablet Take 1 tablet (250 mg total) by mouth daily. Take first 2 tablets together, then 1 every day until finished. Patient not taking: Reported on 12/11/2023 09/27/23   Wellington Half, NP  benzonatate  (TESSALON ) 100 MG capsule Take 1 capsule (100 mg total) by mouth 3 (three) times daily as needed for cough. Patient not taking: Reported on 04/11/2023 04/08/23   Wellington Half, NP  BESIVANCE 0.6 % SUSP Apply 1 drop to eye 3 (three) times daily. 10/04/23   [provider]  bimatoprost (LUMIGAN) 0.03 % ophthalmic solution SMARTSIG:In  Eye(s) 10/16/23   [provider]  Bromfenac Sodium 0.07 % SOLN Apply 1 drop to eye 2 (two) times daily. 10/04/23   [provider]  busPIRone (BUSPAR) 10 MG tablet Take 4 tablets (40 mg total) by mouth as directed. Take 1 tablet three times a day , could take an additional 10 mg daily as needed for severe anxiety 12/07/23   Eappen, Saramma, MD  calcitRIOL  (ROCALTROL ) 0.25 MCG capsule Take 1 capsule (0.25 mcg total) by mouth daily. 07/25/23   Thapa, [place], MD  cyanocobalamin (VITAMIN B12) 1000 MCG/ML injection Inject into the muscle. 08/20/23 04/16/24  [provider]  Difluprednate 0.05 % EMUL INSTILL 1 DROP INTO OPERATIVE EYE THREE TIMES DAILY STARTING 3 DAYS PRIOR TO SURGERY AND CONTINUE FOR 1 WEEK AFTER SURGERY, THEN 2 TIMES A DAY FOR 1 WEEK, THEN 1 TIME A DAY FOR 1 WEEK. THEN STOP 10/04/23   [provider]  escitalopram  (LEXAPRO ) 10 MG tablet Take 1.5 tablets (15 mg total) by mouth daily. 12/07/23   Eappen, Saramma, MD  esomeprazole (NEXIUM) 40 MG capsule Take 1 capsule by mouth daily. 06/04/23   [provider]  EYSUVIS 0.25 % SUSP Instill ONE drop IN [facility] EYE THREE  TIMES DAILY 07/24/23   [provider]  fenofibrate 160 MG tablet Take 1 tablet by mouth daily. 12/17/20   [provider]  FLUBLOK 0.5 ML SOSY Inject into the muscle. 05/24/23   [provider]  fluticasone Odis Bennetts) 50 MCG/ACT nasal spray  12/25/12   [provider]  gabapentin  (NEURONTIN ) 300 MG capsule Take 300 mg by mouth. 2 CAP AM, 1 CAP Q noon, 2 CAP pm, 10/06/19   [provider]  ipratropium (ATROVENT ) 0.03 % nasal spray Place 2 sprays into both nostrils every 12 (twelve) hours. 04/08/23   Wellington Half, NP  isosorbide mononitrate (IMDUR) 30 MG 24 hr tablet  02/06/23   [provider]  lamoTRIgine  (LAMICTAL ) 200 MG tablet Take 0.5 tablets (100 mg total) by mouth 2 (two) times daily. 07/16/23   Eappen, Saramma, MD  levothyroxine  (SYNTHROID ) 75  MCG tablet Take 1 tablet (75 mcg total) by mouth daily. 05/07/23   Thapa, [place], MD  lisinopril (ZESTRIL) 10 MG tablet Take 10 mg by mouth daily. 05/26/19   [provider]  lovastatin (MEVACOR) 20 MG tablet Take 20 mg by mouth at bedtime.    [provider]  prazosin  (MINIPRESS ) 2 MG capsule Take 1 capsule (2 mg total) by mouth at bedtime. 07/16/23   Eappen, Saramma, MD  promethazine (PHENERGAN) 25 MG tablet Take 25 mg by mouth every 6 (six) hours as needed for nausea or vomiting.    [provider]  propranolol ER (INDERAL LA) 80 MG 24 hr capsule Take 80 mg by mouth daily. 10/08/22   [provider]  rOPINIRole  (REQUIP ) 0.25 MG tablet Take 1 tablet (0.25 mg total) by mouth 3 (three) times daily. 10/19/23   Eappen, Saramma, MD  temazepam  (RESTORIL ) 30 MG capsule Take 1 capsule (30 mg total) by mouth at bedtime. 12/07/23 03/06/24  Eappen, Saramma, MD  VEVYE 0.1 % SOLN Apply 1 drop to eye 2 (two) times daily.    [provider]    Family History Family History  Problem Relation Age of Onset   Cancer Mother        colon cancer   Depression Mother    Anxiety disorder Mother    Heart disease Father    Cancer Father        ? neck cancer- still living.    Anxiety disorder Father    Depression Father    Hypoparathyroidism Son    Colon cancer Maternal Aunt        died in 40s.     Social History Social History   Tobacco Use   Smoking status: Never   Smokeless tobacco: Never  Substance Use Topics   Alcohol use: Not Currently   Drug use: No     Allergies   Levofloxacin, Atorvastatin, Hydrochlorothiazide, Parafon forte dsc [chlorzoxazone], Pollen extract, Pseudoephedrine, and Sudafed [pseudoephedrine hcl]   Review of Systems Review of Systems  Constitutional:  Negative for chills and fever.  HENT:  Negative for sore throat, trouble swallowing and voice change.   Respiratory:  Negative for cough and shortness of breath.   Gastrointestinal:   Negative for abdominal pain.  Skin:  Negative for color change, rash and wound.     Physical Exam Triage Vital Signs ED Triage Vitals [12/11/23 1611]  Encounter Vitals Group     BP      Systolic BP Percentile      Diastolic BP Percentile      Pulse      Resp  Temp      Temp src      SpO2      Weight      Height      Head Circumference      Peak Flow      Pain Score 0     Pain Loc      Pain Education      Exclude from Growth Chart    No data found.  Updated Vital Signs BP 130/61   Pulse 78   Temp 97.8 F (36.6 C)   Resp 18   SpO2 95%   Visual Acuity Right Eye Distance:   Left Eye Distance:   Bilateral Distance:    Right Eye Near:   Left Eye Near:    Bilateral Near:     Physical Exam Constitutional:      General: She is not in acute distress. HENT:     Mouth/Throat:     Mouth: Mucous membranes are moist.     Pharynx: Oropharynx is clear.  Cardiovascular:     Rate and Rhythm: Normal rate and regular rhythm.     Heart sounds: Normal heart sounds.  Pulmonary:     Effort: Pulmonary effort is normal. No respiratory distress.     Breath sounds: Normal breath sounds.  Skin:    General: Skin is warm and dry.     Findings: No erythema, lesion or rash.  Neurological:     Mental Status: She is alert.      UC Treatments / Results  Labs (all labs ordered are listed, but only abnormal results are displayed) Labs Reviewed - No data to display  EKG   Radiology No results found.  Procedures Procedures (including critical care time)  Medications Ordered in UC Medications - No data to display  Initial Impression / Assessment and Plan / UC Course  I have reviewed the triage vital signs and the nursing notes.  Pertinent labs & imaging results that were available during my care of the patient were reviewed by me and considered in my medical decision making (see chart for details).    Pruritus.  Afebrile and vital signs are stable.  LFTs normal on  08/15/2023.  Patient states she took 5 tablets of Benadryl last night when she could not sleep due to the intense itching.  Discussed with patient that she should not take this much Benadryl again as this could cause respiratory depression and even death.  Treating today with prednisone taper and encourage patient to take Zyrtec instead of Benadryl.  Instructed her to follow-up with her PCP tomorrow.  Education provided on pruritus.  She agrees to plan of care.  Final Clinical Impressions(s) / UC Diagnoses   Final diagnoses:  Pruritus     Discharge Instructions      Follow up with your primary care provider tomorrow.    Take the prednisone as directed.  Continue taking your antihistamine.    ED Prescriptions     Medication Sig Dispense Auth. Provider   predniSONE (STERAPRED UNI-PAK 21 TAB) 10 MG (21) TBPK tablet Take by mouth daily. As directed 21 tablet Wellington Half, NP      PDMP not reviewed this encounter.   Wellington Half, NP 12/11/23 [phone]

## 2023-12-11 NOTE — ED Triage Notes (Addendum)
 Patient to Urgent Care with complaints of a possible allergic reaction. Unsure what she used that she could be allergic too. Itchy all over.   Symptoms started 2-3 days ago- worse last night. Reports husband has shingles.   Meds: benadryl/ shower provided some relief.

## 2023-12-11 NOTE — Discharge Instructions (Addendum)
 Follow up with your primary care provider tomorrow.    Take the prednisone as directed.  Continue taking your antihistamine.

## 2023-12-13 ENCOUNTER — Other Ambulatory Visit: Payer: Self-pay | Admitting: Psychiatry

## 2023-12-13 DIAGNOSIS — G2581 Restless legs syndrome: Secondary | ICD-10-CM

## 2023-12-13 DIAGNOSIS — G4701 Insomnia due to medical condition: Secondary | ICD-10-CM

## 2024-01-02 ENCOUNTER — Telehealth: Admitting: Psychiatry

## 2024-01-02 ENCOUNTER — Encounter: Payer: Self-pay | Admitting: Psychiatry

## 2024-01-02 DIAGNOSIS — F411 Generalized anxiety disorder: Secondary | ICD-10-CM

## 2024-01-02 DIAGNOSIS — G4701 Insomnia due to medical condition: Secondary | ICD-10-CM | POA: Diagnosis not present

## 2024-01-02 DIAGNOSIS — F3176 Bipolar disorder, in full remission, most recent episode depressed: Secondary | ICD-10-CM | POA: Diagnosis not present

## 2024-01-02 DIAGNOSIS — G2581 Restless legs syndrome: Secondary | ICD-10-CM

## 2024-01-02 MED ORDER — PRAZOSIN HCL 2 MG PO CAPS: 2.0000 mg | ORAL_CAPSULE | Freq: Every day | ORAL | 1 refills | Status: DC

## 2024-01-02 NOTE — Progress Notes (Signed)
 Virtual Visit via Video Note  I connected with Kaylee Gordon on 01/02/24 at  4:20 PM EDT by a video enabled telemedicine application and verified that I am speaking with the correct person using two identifiers.  Location Provider Location : [facility] Patient Location : Home  Participants: Patient , Provider   I discussed the limitations of evaluation and management by telemedicine and the availability of in person appointments. The patient expressed understanding and agreed to proceed   I discussed the assessment and treatment plan with the patient. The patient was provided an opportunity to ask questions and all were answered. The patient agreed with the plan and demonstrated an understanding of the instructions.   The patient was advised to call back or seek an in-person evaluation if the symptoms worsen or if the condition fails to improve as anticipated.  [facility] MD OP Progress Note  01/04/2024 6:41 AM Kaylee Gordon  MRN:  [id number]  Chief Complaint:  Chief Complaint  Patient presents with   Follow-up   Depression   Anxiety   Medication Refill   Discussed the use of AI scribe software for clinical note transcription with the patient, who gave verbal consent to proceed.  History of Present Illness Kaylee Gordon is a 63 year old Caucasian female who has a history of bipolar disorder, GAD, fibromyalgia, married, lives in [place], was evaluated by telemedicine today.  Her anxiety symptoms have been improving with the current medication regimen, which includes Lexapro  and Buspar . She finds the medication very helpful in managing her symptoms.  She especially likes the additional dosage of BuSpar  which has been helpful with her anxiety symptoms.  Denies suicidal thoughts.  Denies any homicidality or perceptual disturbances.  Reports therapy sessions are progressing well and has upcoming appointment with Ellouise Hummer.  She is currently compliant on all her medications as  prescribed.  Denies side effects.    She recently experienced a fibromyalgia event characterized by extreme fatigue, leading her to sleep for several hours during the day. These episodes occur occasionally and 'knock her out,' but she feels better the following day. She is on gabapentin  300 mg for this condition.  Her sleep is generally better, although she occasionally experiences excessive fatigue related to fibromyalgia. She takes temazepam  30 mg at bedtime to aid her sleep.  Her son is preparing to move to [place]  for work, and her daughter is moving nearby. These changes are happening in the same week, contributing to a busy period in her life.  She denies any other concerns today.   Visit Diagnosis:    ICD-10-CM   1. Bipolar disorder, in full remission, most recent episode depressed (HCC)  F31.76     2. GAD (generalized anxiety disorder)  F41.1     3. Insomnia due to medical condition  G47.01 prazosin  (MINIPRESS ) 2 MG capsule   anxiety, RLS      Past Psychiatric History: I have reviewed past psychiatric history from progress note on 09/11/2018.  Past trials of medications like Ambien, Lunesta , Sonata , Belsomra .  Past Medical History:  Past Medical History:  Diagnosis Date   Anxiety    Asthma    B12 deficiency    Cancer (HCC)    SKIN CANCER   CHF (congestive heart failure) (HCC)    Depression    Hypertension    Thyroid  disease     Past Surgical History:  Procedure Laterality Date   ANKLE SURGERY Right 2012   APPENDECTOMY     AUGMENTATION MAMMAPLASTY  Bilateral    BREAST ENHANCEMENT SURGERY     COLONOSCOPY WITH PROPOFOL  N/A 02/22/2015   Procedure: COLONOSCOPY WITH PROPOFOL ;  Surgeon: Donnice Vaughn Manes, MD;  Location: [facility] ENDOSCOPY;  Service: Endoscopy;  Laterality: N/A;   COLONOSCOPY WITH PROPOFOL  N/A 08/08/2022   Procedure: COLONOSCOPY WITH PROPOFOL ;  Surgeon: Maryruth Ole DASEN, MD;  Location: [facility] ENDOSCOPY;  Service: Endoscopy;  Laterality: N/A;    ESOPHAGOGASTRODUODENOSCOPY (EGD) WITH PROPOFOL  N/A 02/22/2015   Procedure: ESOPHAGOGASTRODUODENOSCOPY (EGD) WITH PROPOFOL ;  Surgeon: Donnice Vaughn Manes, MD;  Location: [facility] ENDOSCOPY;  Service: Endoscopy;  Laterality: N/A;   ESOPHAGOGASTRODUODENOSCOPY (EGD) WITH PROPOFOL  N/A 02/11/2016   Procedure: ESOPHAGOGASTRODUODENOSCOPY (EGD) WITH PROPOFOL ;  Surgeon: Lamar DASEN Holmes, MD;  Location: [facility] ENDOSCOPY;  Service: Endoscopy;  Laterality: N/A;   ESOPHAGOGASTRODUODENOSCOPY (EGD) WITH PROPOFOL  N/A 08/08/2022   Procedure: ESOPHAGOGASTRODUODENOSCOPY (EGD) WITH PROPOFOL ;  Surgeon: Maryruth Ole DASEN, MD;  Location: [facility] ENDOSCOPY;  Service: Endoscopy;  Laterality: N/A;   HEMORRHOID SURGERY     NASAL SINUS SURGERY     SAVORY DILATION N/A 02/22/2015   Procedure: SAVORY DILATION;  Surgeon: Donnice Vaughn Manes, MD;  Location: [facility] ENDOSCOPY;  Service: Endoscopy;  Laterality: N/A;   SAVORY DILATION N/A 02/11/2016   Procedure: SAVORY DILATION;  Surgeon: Lamar DASEN Holmes, MD;  Location: [facility] ENDOSCOPY;  Service: Endoscopy;  Laterality: N/A;   SKIN CANCER EXCISION      Family Psychiatric History: I have reviewed family psychiatric history from progress note on 09/11/2018.  Family History:  Family History  Problem Relation Age of Onset   Cancer Mother        colon cancer   Depression Mother    Anxiety disorder Mother    Heart disease Father    Cancer Father        ? neck cancer- still living.    Anxiety disorder Father    Depression Father    Hypoparathyroidism Son    Colon cancer Maternal Aunt        died in 60s.     Social History: I have reviewed social history from progress note on 09/11/2018. Social History   Socioeconomic History   Marital status: Married    Spouse name: Ozell   Number of children: Not on file   Years of education: Not on file   Highest education level: Not on file  Occupational History   Not on file  Tobacco Use   Smoking status: Never   Smokeless tobacco: Never   Vaping Use   Vaping status: Not on file  Substance and Sexual Activity   Alcohol use: Not Currently   Drug use: No   Sexual activity: Not Currently    Partners: Male  Other Topics Concern   Not on file  Social History Narrative   Lives in [place]; taught high school- math; retd; no smoking; social alcohol.    Social Drivers of Corporate investment banker Strain: Low Risk  (08/07/2023)   Received [facility]   Overall Financial Resource Strain (CARDIA)    Difficulty of Paying Living Expenses: Not hard at all  Food Insecurity: No Food Insecurity (08/07/2023)   Received from [facility]   Hunger Vital Sign    Within the past 12 months, you worried that your food would run out before you got the money to buy more.: Never true    Within the past 12 months, the food you bought just didn't last and you didn't have money to get more.: Never true  Transportation Needs: No Transportation Needs (08/07/2023)   Received from [facility]   PRAPARE - Transportation    Lack of Transportation (Non-Medical): No    In the past 12 months, has lack of transportation kept you from medical appointments or from getting medications?: No  Physical Activity: Unknown (05/16/2018)   Exercise Vital Sign    Days of Exercise per Week: 0 days    Minutes of Exercise per Session: Not on file  Stress: Stress Concern Present (05/16/2018)   [employer] of Occupational Health - Occupational Stress Questionnaire    Feeling of Stress : Very much  Social Connections: Unknown (05/16/2018)   Social Connection and Isolation Panel    Frequency of Communication with Friends and Family: Twice a week    Frequency of Social Gatherings with Friends and Family: Twice a week    Attends Religious Services: More than 4 times per year    Active Member of [employer] or Organizations: Not on file    Attends Banker Meetings: Not on file    Marital Status:  Married    Allergies:  Allergies  Allergen Reactions   Levofloxacin Other (See Comments) and Nausea Only    Lethargic, Flu-like symptoms, Hot flashes    Atorvastatin    Hydrochlorothiazide Other (See Comments)    Intolerance - dry lips   Parafon Forte Dsc [Chlorzoxazone]    Pollen Extract    Pseudoephedrine Other (See Comments)   Sudafed [Pseudoephedrine Hcl]     Heart racing    Metabolic Disorder Labs: Lab Results  Component Value Date   HGBA1C 5.9 (H) 02/16/2021   Lab Results  Component Value Date   PROLACTIN 18.3 07/22/2014   Lab Results  Component Value Date   CHOL 184 02/16/2021   TRIG 179 (H) 02/16/2021   HDL 37 (L) 02/16/2021   CHOLHDL 5.0 (H) 02/16/2021   LDLCALC 115 (H) 02/16/2021   Lab Results  Component Value Date   TSH 2.03 07/24/2023   TSH 1.59 01/22/2023    Therapeutic Level Labs: No results found for: LITHIUM Lab Results  Component Value Date   VALPROATE 39 (L) 02/04/2019   No results found for: CBMZ  Current Medications: Current Outpatient Medications  Medication Sig Dispense Refill   ALREX 0.2 % SUSP Apply 1 drop to eye 3 (three) times daily.     busPIRone  (BUSPAR ) 10 MG tablet Take 4 tablets (40 mg total) by mouth as directed. Take 1 tablet three times a day , could take an additional 10 mg daily as needed for severe anxiety 120 tablet 1   calcitRIOL  (ROCALTROL ) 0.25 MCG capsule Take 1 capsule (0.25 mcg total) by mouth daily. 90 capsule 3   cyanocobalamin (VITAMIN B12) 1000 MCG/ML injection Inject into the muscle.     escitalopram  (LEXAPRO ) 10 MG tablet Take 1.5 tablets (15 mg total) by mouth daily. 135 tablet 0   esomeprazole (NEXIUM) 40 MG capsule Take 1 capsule by mouth daily.     fenofibrate 160 MG tablet Take 1 tablet by mouth daily.     fluticasone (FLONASE) 50 MCG/ACT nasal spray      gabapentin  (NEURONTIN ) 300 MG capsule Take 300 mg by mouth. 2 CAP AM, 1 CAP Q noon, 2 CAP pm,     isosorbide mononitrate (IMDUR) 30 MG 24 hr  tablet      lamoTRIgine  (LAMICTAL ) 200 MG tablet Take 0.5 tablets (100 mg total) by mouth 2 (two) times daily. 90 tablet 1   levothyroxine  (SYNTHROID )  75 MCG tablet Take 1 tablet (75 mcg total) by mouth daily. 90 tablet 3   lisinopril (ZESTRIL) 10 MG tablet Take 10 mg by mouth daily.     lovastatin (MEVACOR) 20 MG tablet Take 20 mg by mouth at bedtime.     propranolol ER (INDERAL LA) 80 MG 24 hr capsule Take 80 mg by mouth daily.     rOPINIRole  (REQUIP ) 0.25 MG tablet TAKE 1 TABLET BY MOUTH 3 TIMES A DAY 90 tablet 1   temazepam  (RESTORIL ) 30 MG capsule Take 1 capsule (30 mg total) by mouth at bedtime. 30 capsule 2   prazosin  (MINIPRESS ) 2 MG capsule Take 1 capsule (2 mg total) by mouth at bedtime. 90 capsule 1   No current facility-administered medications for this visit.     Musculoskeletal: Strength & Muscle Tone: UTA Gait & Station: Seated Patient leans: N/A  Psychiatric Specialty Exam: Review of Systems  Psychiatric/Behavioral:  Positive for sleep disturbance. The patient is nervous/anxious.     There were no vitals taken for this visit.There is no height or weight on file to calculate BMI.  General Appearance: Casual  Eye Contact:  Fair  Speech:  Clear and Coherent  Volume:  Normal  Mood:  Anxious  Affect:  Congruent  Thought Process:  Goal Directed and Descriptions of Associations: Intact  Orientation:  Full (Time, Place, and Person)  Thought Content: Logical   Suicidal Thoughts:  No  Homicidal Thoughts:  No  Memory:  Immediate;   Fair Recent;   Fair Remote;   Fair  Judgement:  Fair  Insight:  Fair  Psychomotor Activity:  Normal  Concentration:  Concentration: Fair and Attention Span: Fair  Recall:  [employer] of Knowledge: Fair  Language: Fair  Akathisia:  No  Handed:  Right  AIMS (if indicated): not done  Assets:  Communication Skills Desire for Improvement Housing Social Support Transportation  ADL's:  Intact  Cognition: WNL  Sleep:  improving    Screenings: AIMS    Flowsheet Row Video Visit from 12/08/2021 in [facility]  AIMS Total Score 0   GAD-7    Flowsheet Row Office Visit from 04/11/2023 in [facility] Office Visit from 11/21/2022 in [facility] Video Visit from 03/28/2022 in [facility] Video Visit from 02/06/2022 in [facility] Video Visit from 01/24/2022 in [facility]  Total GAD-7 Score 21 14 10 7 10    PHQ2-9    Flowsheet Row Office Visit from 04/11/2023 in [facility] Office Visit from 11/21/2022 in [facility] Video Visit from 06/27/2022 in [facility] Video Visit from 03/28/2022 in [facility] Video Visit from 02/06/2022 in [facility]  PHQ-2 Total Score 2 2 0 0 0  PHQ-9 Total Score 11 11 -- -- --   Flowsheet Row Video Visit from 01/02/2024 in [facility] UC from 12/11/2023 in [facility]  Video Visit from 12/07/2023 in [facility]  C-SSRS RISK CATEGORY No Risk No Risk No Risk     Assessment and Plan: JACQUELYN SHADRICK is a 63 year old Caucasian female who has a history of bipolar disorder, GAD, insomnia was evaluated by telemedicine today.  Discussed assessment and plan as noted below.  Generalized anxiety disorder-improving Currently reports anxiety symptoms as responding to the additional dosage of BuSpar .  She continues to be compliant on her medications as well as has been compliant with therapy sessions which are helpful. Continue Lexapro  15 mg daily Continue BuSpar  30 mg  daily in divided dosage with option to take an extra 10 mg as needed for anxiety daily. Continue psychotherapy sessions with Ms. Ellouise Hummer.  Bipolar disorder most recent episode depressed in remission Currently denies any significant mood lability. Continue Lamotrigine  200 mg daily Continue Lexapro  15 mg daily Continue psychotherapy sessions.  Insomnia-improving Does have sleep problems especially with fibromyalgia flareups which causes excessive sleepiness and pain affecting sleep at night on and off.  However currently reports sleep is overall improving. Continue Temazepam  30 mg at bedtime Continue Prazosin  2 mg at bedtime for nightmares Continue Requip  0.25 mg 3 times a day Continue Gabapentin  for fibromyalgia as prescribed. Reviewed [place] PMP AWARxE  Will consider repeating EKG in the future.  Most recent EKG dated 11/16/2023-[id number].  Normal sinus rhythm.  Follow-up Follow-up in clinic in 3 months or sooner if needed.  Collaboration of Care: Collaboration of Care: Referral or follow-up with counselor/therapist AEB encouraged to continue therapy , has upcoming appointment with Ms.Ellouise Hummer  Patient/Guardian was advised Release of Information must be obtained prior to any record release in order to collaborate their care with an outside provider. Patient/Guardian was advised if they have not already done so to contact the registration department to sign all necessary forms in order for us  to release information regarding their care.   Consent: Patient/Guardian gives verbal consent for treatment and assignment of benefits for services provided during this visit. Patient/Guardian expressed understanding and agreed to proceed.   This note was generated in part or whole with voice recognition software. Voice recognition is usually quite accurate but there are transcription errors that can and very often do occur. I apologize for any typographical errors that were not detected and  corrected.    Kamorah Nevils, MD 01/04/2024, 6:41 AM

## 2024-01-29 ENCOUNTER — Other Ambulatory Visit: Payer: Self-pay | Admitting: Psychiatry

## 2024-01-29 DIAGNOSIS — F411 Generalized anxiety disorder: Secondary | ICD-10-CM

## 2024-01-29 DIAGNOSIS — F3131 Bipolar disorder, current episode depressed, mild: Secondary | ICD-10-CM

## 2024-02-04 ENCOUNTER — Other Ambulatory Visit: Payer: Self-pay | Admitting: Family Medicine

## 2024-02-04 DIAGNOSIS — Z1231 Encounter for screening mammogram for malignant neoplasm of breast: Secondary | ICD-10-CM

## 2024-02-08 ENCOUNTER — Ambulatory Visit
Admission: RE | Admit: 2024-02-08 | Discharge: 2024-02-08 | Disposition: A | Discharge status: Home / Self Care | Source: Ambulatory Visit | Attending: Family Medicine | Admitting: Family Medicine

## 2024-02-08 ENCOUNTER — Other Ambulatory Visit: Payer: Self-pay | Admitting: Psychiatry

## 2024-02-08 DIAGNOSIS — Z1231 Encounter for screening mammogram for malignant neoplasm of breast: Secondary | ICD-10-CM | POA: Diagnosis present

## 2024-02-08 DIAGNOSIS — G2581 Restless legs syndrome: Secondary | ICD-10-CM

## 2024-02-08 DIAGNOSIS — G4701 Insomnia due to medical condition: Secondary | ICD-10-CM

## 2024-02-12 ENCOUNTER — Ambulatory Visit: Payer: Medicare PPO | Admitting: Endocrinology

## 2024-02-12 ENCOUNTER — Encounter: Payer: Self-pay | Admitting: Endocrinology

## 2024-02-12 ENCOUNTER — Telehealth: Payer: Self-pay

## 2024-02-12 VITALS — BP 124/60 | HR 82 | Resp 20 | Ht 64.0 in | Wt 162.6 lb

## 2024-02-12 DIAGNOSIS — E039 Hypothyroidism, unspecified: Secondary | ICD-10-CM | POA: Diagnosis not present

## 2024-02-12 DIAGNOSIS — E200 Idiopathic hypoparathyroidism: Secondary | ICD-10-CM | POA: Diagnosis not present

## 2024-02-12 DIAGNOSIS — F3176 Bipolar disorder, in full remission, most recent episode depressed: Secondary | ICD-10-CM

## 2024-02-12 LAB — TSH: TSH: 1.94 m[IU]/L (ref 0.40–4.50)

## 2024-02-12 LAB — T4, FREE: Free T4: 1.3 ng/dL (ref 0.8–1.8)

## 2024-02-12 MED ORDER — LAMOTRIGINE 200 MG PO TABS: 100.0000 mg | ORAL_TABLET | Freq: Two times a day (BID) | ORAL | 1 refills | Status: DC

## 2024-02-12 NOTE — Telephone Encounter (Signed)
I have sent Lamictal to pharmacy. °

## 2024-02-12 NOTE — Progress Notes (Signed)
 Outpatient Endocrinology Note [place] Caylen Yardley, MD   Patient's Name: Kaylee Gordon    DOB: 06-18-1961    MRN: [id number]  REASON OF VISIT:  Follow-up for hypothyroidism / hypoparathyroidism  PCP: Alla Amis, MD  HISTORY OF PRESENT ILLNESS:   Kaylee Gordon is a 63 y.o. old female with past medical history as listed below is presented for a follow up for hypothyroidism / hypoparathyroidism.   Pertinent History: Patient was previously seen by Dr. Von and was last time seen in July 2024.  # Hypothyroidism : ?  Secondary She was initially given thyroxine supplement with a borderline TSH levels in 2011. However because of complaints of  fatigue on her visit in 10/15 she had thyroid  levels done and free T4 was significantly low She was empirically started on 25 g  of levothyroxine . Initially she felt less tired with this; however her free T4 continue to be low and her dose had been increased progressively.  There was question of secondary hypothyroidism, based on prior endocrinology office note.  Lately she has been taking levothyroxine  75 mcg daily.    # Hypoparathyroidism -Patient has history of hypercalcemia since age of 69.  She initially presented with symptoms of cramping in the hands along with twitching of the face and eyes.  She was diagnosed at [facility] as primary idiopathic hypoparathyroidism.  She has been taking calcitriol  0.25 mcg daily in the morning and calcium 1200 mcg daily in the evening as needed additional calcium.  She eats fairly good amount of dairy products  /milk daily.  She does not take vitamin D3 supplement.  No history of kidney stone.   Interval history Patient has been taking levothyroxine  75 mcg daily.  She takes in the morning before breakfast.  She ran out of it yesterday.  She denies palpitation or heat intolerance.  Overall feeling usual energy.  She has been taking calcitriol  0.25 mcg daily and calcium 1200 mg daily in total.  She  takes calcium as needed as well.  She complains of occasional right foot numbness however she thinks is related to her position.  She does not get numbness in other areas.  She had lab on July 17 with PCP, serum calcium was 9.5 and albumin was 4.5.  Reviewed from her phone app.  She did not have thyroid  function test at that time.   REVIEW OF SYSTEMS:  As per history of present illness.   PAST MEDICAL HISTORY: Past Medical History:  Diagnosis Date   Anxiety    Asthma    B12 deficiency    Cancer (HCC)    SKIN CANCER   CHF (congestive heart failure) (HCC)    Depression    Hypertension    Thyroid  disease     PAST SURGICAL HISTORY: Past Surgical History:  Procedure Laterality Date   ANKLE SURGERY Right 2012   APPENDECTOMY     AUGMENTATION MAMMAPLASTY Bilateral    BREAST ENHANCEMENT SURGERY     COLONOSCOPY WITH PROPOFOL  N/A 02/22/2015   Procedure: COLONOSCOPY WITH PROPOFOL ;  Surgeon: Donnice Vaughn Manes, MD;  Location: [facility] ENDOSCOPY;  Service: Endoscopy;  Laterality: N/A;   COLONOSCOPY WITH PROPOFOL  N/A 08/08/2022   Procedure: COLONOSCOPY WITH PROPOFOL ;  Surgeon: Maryruth Ole DASEN, MD;  Location[facility] ENDOSCOPY;  Service: Endoscopy;  Laterality: N/A;   ESOPHAGOGASTRODUODENOSCOPY (EGD) WITH PROPOFOL  N/A 02/22/2015   Procedure: ESOPHAGOGASTRODUODENOSCOPY (EGD) WITH PROPOFOL ;  Surgeon: Donnice Vaughn Manes, MD;  Location: [facility] ENDOSCOPY;  Service: Endoscopy;  Laterality: N/A;  ESOPHAGOGASTRODUODENOSCOPY (EGD) WITH PROPOFOL  N/A 02/11/2016   Procedure: ESOPHAGOGASTRODUODENOSCOPY (EGD) WITH PROPOFOL ;  Surgeon: Lamar ONEIDA Holmes, MD;  Location: [facility] ENDOSCOPY;  Service: Endoscopy;  Laterality: N/A;   ESOPHAGOGASTRODUODENOSCOPY (EGD) WITH PROPOFOL  N/A 08/08/2022   Procedure: ESOPHAGOGASTRODUODENOSCOPY (EGD) WITH PROPOFOL ;  Surgeon: Maryruth Ole ONEIDA, MD;  Location: [facility] ENDOSCOPY;  Service: Endoscopy;  Laterality: N/A;   HEMORRHOID SURGERY     NASAL SINUS SURGERY     SAVORY DILATION N/A  02/22/2015   Procedure: SAVORY DILATION;  Surgeon: Donnice Vaughn Manes, MD;  Location: [facility] ENDOSCOPY;  Service: Endoscopy;  Laterality: N/A;   SAVORY DILATION N/A 02/11/2016   Procedure: SAVORY DILATION;  Surgeon: Lamar ONEIDA Holmes, MD;  Location: [facility] ENDOSCOPY;  Service: Endoscopy;  Laterality: N/A;   SKIN CANCER EXCISION      ALLERGIES: Allergies  Allergen Reactions   Levofloxacin Other (See Comments) and Nausea Only    Lethargic, Flu-like symptoms, Hot flashes    Atorvastatin    Hydrochlorothiazide Other (See Comments)    Intolerance - dry lips   Parafon Forte Dsc [Chlorzoxazone]    Pollen Extract    Pseudoephedrine Other (See Comments)   Sudafed [Pseudoephedrine Hcl]     Heart racing    FAMILY HISTORY:  Family History  Problem Relation Age of Onset   Cancer Mother        colon cancer   Depression Mother    Anxiety disorder Mother    Heart disease Father    Cancer Father        ? neck cancer- still living.    Anxiety disorder Father    Depression Father    Hypoparathyroidism Son    Colon cancer Maternal Aunt        died in 17s.     SOCIAL HISTORY: Social History   Socioeconomic History   Marital status: Married    Spouse name: Ozell   Number of children: Not on file   Years of education: Not on file   Highest education level: Not on file  Occupational History   Not on file  Tobacco Use   Smoking status: Never   Smokeless tobacco: Never  Vaping Use   Vaping status: Not on file  Substance and Sexual Activity   Alcohol use: Not Currently   Drug use: No   Sexual activity: Not Currently    Partners: Male  Other Topics Concern   Not on file  Social History Narrative   Lives in [place]; taught high school- math; retd; no smoking; social alcohol.    Social Drivers of Corporate investment banker Strain: Low Risk  (08/07/2023)   Received from [facility]   Overall Financial Resource Strain (CARDIA)    Difficulty of Paying Living  Expenses: Not hard at all  Food Insecurity: No Food Insecurity (08/07/2023)   Received [facility]   Hunger Vital Sign    Within the past 12 months, you worried that your food would run out before you got the money to buy more.: Never true    Within the past 12 months, the food you bought just didn't last and you didn't have money to get more.: Never true  Transportation Needs: No Transportation Needs (08/07/2023)   Received from [facility]   PRAPARE - Transportation    Lack of Transportation (Non-Medical): No    In the past 12 months, has lack of transportation kept you from medical appointments or from getting medications?: No  Physical Activity:  Unknown (05/16/2018)   Exercise Vital Sign    Days of Exercise per Week: 0 days    Minutes of Exercise per Session: Not on file  Stress: Stress Concern Present (05/16/2018)   [employer] of Occupational Health - Occupational Stress Questionnaire    Feeling of Stress : Very much  Social Connections: Unknown (05/16/2018)   Social Connection and Isolation Panel    Frequency of Communication with Friends and Family: Twice a week    Frequency of Social Gatherings with Friends and Family: Twice a week    Attends Religious Services: More than 4 times per year    Active Member of [employer] or Organizations: Not on file    Attends Banker Meetings: Not on file    Marital Status: Married    MEDICATIONS:  Current Outpatient Medications  Medication Sig Dispense Refill   ALREX 0.2 % SUSP Apply 1 drop to eye 3 (three) times daily.     busPIRone  (BUSPAR ) 10 MG tablet TAKE 1 TABLET BY MOUTH 3 TIMES A DAY AND 1 EXTRA TABLET AS NEEDED FOR SEVERE ANXIETY 120 tablet 1   calcitRIOL  (ROCALTROL ) 0.25 MCG capsule Take 1 capsule (0.25 mcg total) by mouth daily. 90 capsule 3   cyanocobalamin (VITAMIN B12) 1000 MCG/ML injection Inject into the muscle.     escitalopram  (LEXAPRO ) 10 MG tablet Take 1.5 tablets  (15 mg total) by mouth daily. 135 tablet 0   esomeprazole (NEXIUM) 40 MG capsule Take 1 capsule by mouth daily.     fenofibrate 160 MG tablet Take 1 tablet by mouth daily.     fluticasone (FLONASE) 50 MCG/ACT nasal spray      gabapentin  (NEURONTIN ) 300 MG capsule Take 300 mg by mouth. 2 CAP AM, 1 CAP Q noon, 2 CAP pm,     isosorbide mononitrate (IMDUR) 30 MG 24 hr tablet      lamoTRIgine  (LAMICTAL ) 200 MG tablet Take 0.5 tablets (100 mg total) by mouth 2 (two) times daily. 90 tablet 1   levothyroxine  (SYNTHROID ) 75 MCG tablet Take 1 tablet (75 mcg total) by mouth daily. 90 tablet 3   lisinopril (ZESTRIL) 10 MG tablet Take 10 mg by mouth daily.     lovastatin (MEVACOR) 20 MG tablet Take 20 mg by mouth at bedtime.     prazosin  (MINIPRESS ) 2 MG capsule Take 1 capsule (2 mg total) by mouth at bedtime. 90 capsule 1   propranolol ER (INDERAL LA) 80 MG 24 hr capsule Take 80 mg by mouth daily.     rOPINIRole  (REQUIP ) 0.25 MG tablet TAKE 1 TABLET BY MOUTH 3 TIMES A DAY 90 tablet 1   temazepam  (RESTORIL ) 30 MG capsule Take 1 capsule (30 mg total) by mouth at bedtime. 30 capsule 2   No current facility-administered medications for this visit.    PHYSICAL EXAM: Vitals:   02/12/24 1506  BP: 124/60  Pulse: 82  Resp: 20  SpO2: 98%  Weight: 162 lb 9.6 oz (73.8 kg)  Height: 5' 4 (1.626 m)   Body mass index is 27.91 kg/m.  Wt Readings from Last 3 Encounters:  02/12/24 162 lb 9.6 oz (73.8 kg)  09/13/23 157 lb (71.2 kg)  07/24/23 162 lb (73.5 kg)    General: Well developed, well nourished female in no apparent distress.  HEENT: AT/[place], no external lesions. Hearing intact to the spoken word Eyes: EOMI.Conjunctiva clear and no icterus. Neck: Trachea midline, neck supple without appreciable thyromegaly or lymphadenopathy and no palpable thyroid  nodules Lungs: Clear  to auscultation, no wheeze. Respirations not labored Heart: S1S2, Regular in rate and rhythm. Abdomen: Soft, non tender, non  distended Neurologic: Alert, oriented, normal speech, deep tendon biceps reflexes normal,  no gross focal neurological deficit Extremities: No pedal pitting edema, no tremors of outstretched hands.  Chovstek's sign negative. Skin: Warm, color good.  Psychiatric: Does not appear depressed or anxious  PERTINENT HISTORIC LABORATORY AND IMAGING STUDIES:  All pertinent laboratory results were reviewed. Please see HPI also for further details.   TSH  Date Value Ref Range Status  07/24/2023 2.03 0.40 - 4.50 mIU/L Final  01/22/2023 1.59 0.35 - 5.50 uIU/mL Final  07/18/2022 1.19 0.35 - 5.50 uIU/mL Final     ASSESSMENT / PLAN  1. Idiopathic hypoparathyroidism (HCC)   2. Hypothyroidism, unspecified type     # Hypothyroidism: -Question of secondary hypothyroidism based on prior endocrinology office note.  She has been on thyroid  hormone replacement since her diagnosis seems to be around 2015. -She is currently taking levothyroxine  75 mcg daily. - Will check thyroid  function test today.  She needs refill.  # Hypoparathyroidism :  -Diagnosed at the age of 60, at [facility], has primary idiopathic hypoparathyroidism. -Currently taking calcitriol  0.25 mcg daily and calcium 1200 mg daily.  She takes as needed calcium for symptoms.  Plan: -Check thyroid  function test - She had normal serum calcium few days ago, no plan for calcium lab today.    Diagnoses and all orders for this visit:  Idiopathic hypoparathyroidism (HCC)  Hypothyroidism, unspecified type -     T4, free -     TSH    DISPOSITION Follow up in clinic in 6 months suggested.  Labs today and labs on same day of the visit.  All questions answered and patient verbalized understanding of the plan.  Iraq Dontez Hauss, MD [facility] End[facility] [address] [place], [address] [place], [place] [address] Phone # [phone]  At least part of this note was generated using voice recognition software.  Inadvertent word errors may have occurred, which were not recognized during the proofreading process.

## 2024-02-12 NOTE — Telephone Encounter (Signed)
 received fax requesting a refill on the lamotrigine  200mg . pt was last seen on 6-18 next appt 9-2

## 2024-02-13 NOTE — Telephone Encounter (Signed)
pt was notified that rx was sent to the pharmacy

## 2024-02-14 ENCOUNTER — Ambulatory Visit: Payer: Self-pay | Admitting: Endocrinology

## 2024-02-14 ENCOUNTER — Other Ambulatory Visit: Payer: Self-pay | Admitting: Endocrinology

## 2024-02-14 DIAGNOSIS — E200 Idiopathic hypoparathyroidism: Secondary | ICD-10-CM

## 2024-02-14 MED ORDER — LEVOTHYROXINE SODIUM 75 MCG PO TABS
75.0000 ug | ORAL_TABLET | Freq: Every day | ORAL | 3 refills | Status: AC
Start: 2024-02-14 — End: ?

## 2024-02-25 ENCOUNTER — Inpatient Hospital Stay: Attending: Internal Medicine | Admitting: Internal Medicine

## 2024-02-25 ENCOUNTER — Inpatient Hospital Stay

## 2024-02-25 ENCOUNTER — Encounter: Payer: Self-pay | Admitting: Internal Medicine

## 2024-02-25 VITALS — BP 103/61 | HR 78 | Temp 99.0°F | Resp 20 | Ht 64.0 in | Wt 161.0 lb

## 2024-02-25 DIAGNOSIS — D649 Anemia, unspecified: Secondary | ICD-10-CM | POA: Diagnosis present

## 2024-02-25 DIAGNOSIS — K921 Melena: Secondary | ICD-10-CM | POA: Diagnosis not present

## 2024-02-25 DIAGNOSIS — R0789 Other chest pain: Secondary | ICD-10-CM | POA: Diagnosis not present

## 2024-02-25 DIAGNOSIS — G8929 Other chronic pain: Secondary | ICD-10-CM | POA: Diagnosis not present

## 2024-02-25 DIAGNOSIS — Z79899 Other long term (current) drug therapy: Secondary | ICD-10-CM | POA: Insufficient documentation

## 2024-02-25 DIAGNOSIS — F319 Bipolar disorder, unspecified: Secondary | ICD-10-CM | POA: Diagnosis not present

## 2024-02-25 DIAGNOSIS — Z8719 Personal history of other diseases of the digestive system: Secondary | ICD-10-CM | POA: Insufficient documentation

## 2024-02-25 DIAGNOSIS — Z800 Family history of malignant neoplasm of digestive organs: Secondary | ICD-10-CM | POA: Insufficient documentation

## 2024-02-25 LAB — IRON AND TIBC
Iron: 26 ug/dL — ABNORMAL LOW (ref 28–170)
Saturation Ratios: 5 % — ABNORMAL LOW (ref 10.4–31.8)
TIBC: 543 ug/dL — ABNORMAL HIGH (ref 250–450)
UIBC: 517 ug/dL

## 2024-02-25 LAB — CBC WITH DIFFERENTIAL/PLATELET
Abs Immature Granulocytes: 0.03 K/uL (ref 0.00–0.07)
Basophils Absolute: 0.1 K/uL (ref 0.0–0.1)
Basophils Relative: 1 %
Eosinophils Absolute: 0.2 K/uL (ref 0.0–0.5)
Eosinophils Relative: 3 %
HCT: 32.5 % — ABNORMAL LOW (ref 36.0–46.0)
Hemoglobin: 10.1 g/dL — ABNORMAL LOW (ref 12.0–15.0)
Immature Granulocytes: 1 %
Lymphocytes Relative: 21 %
Lymphs Abs: 1.3 K/uL (ref 0.7–4.0)
MCH: 24.6 pg — ABNORMAL LOW (ref 26.0–34.0)
MCHC: 31.1 g/dL (ref 30.0–36.0)
MCV: 79.3 fL — ABNORMAL LOW (ref 80.0–100.0)
Monocytes Absolute: 0.8 K/uL (ref 0.1–1.0)
Monocytes Relative: 13 %
Neutro Abs: 3.8 K/uL (ref 1.7–7.7)
Neutrophils Relative %: 61 %
Platelets: 269 K/uL (ref 150–400)
RBC: 4.1 MIL/uL (ref 3.87–5.11)
RDW: 14.6 % (ref 11.5–15.5)
WBC: 6.2 K/uL (ref 4.0–10.5)
nRBC: 0 % (ref 0.0–0.2)

## 2024-02-25 LAB — COMPREHENSIVE METABOLIC PANEL WITH GFR
ALT: 12 U/L (ref 0–44)
AST: 22 U/L (ref 15–41)
Albumin: 4 g/dL (ref 3.5–5.0)
Alkaline Phosphatase: 71 U/L (ref 38–126)
Anion gap: 9 (ref 5–15)
BUN: 24 mg/dL — ABNORMAL HIGH (ref 8–23)
CO2: 23 mmol/L (ref 22–32)
Calcium: 9 mg/dL (ref 8.9–10.3)
Chloride: 106 mmol/L (ref 98–111)
Creatinine, Ser: 1.18 mg/dL — ABNORMAL HIGH (ref 0.44–1.00)
GFR, Estimated: 52 mL/min — ABNORMAL LOW (ref >60)
Glucose, Bld: 89 mg/dL (ref 70–99)
Potassium: 4 mmol/L (ref 3.5–5.1)
Sodium: 138 mmol/L (ref 135–145)
Total Bilirubin: 0.5 mg/dL (ref 0.0–1.2)
Total Protein: 6.8 g/dL (ref 6.5–8.1)

## 2024-02-25 LAB — FERRITIN: Ferritin: 6 ng/mL — ABNORMAL LOW (ref 11–307)

## 2024-02-25 LAB — FOLATE: Folate: 24 ng/mL (ref >5.9)

## 2024-02-25 LAB — LACTATE DEHYDROGENASE: LDH: 203 U/L — ABNORMAL HIGH (ref 98–192)

## 2024-02-25 NOTE — Progress Notes (Signed)
 Fatigue/weakness: YES Dyspena: YES Light headedness: YES Blood in stool: NO   Will need to recheck BP before she leaves.

## 2024-02-25 NOTE — Assessment & Plan Note (Addendum)
#   Moderate anemia hemoglobin 10- ; platelets -WNL.  [JULY 2025-PCP]; GFR  54  calcium normal.  However I had a long discussion with the patient regarding etiology of unexplained anemia-benign etiologies like iron  deficiency etc..  Low clinical concern for any malignant process.   Recommend checking CBC CMP LDH haptoglobin B12 folic acid reticulocyte count; Iron  studies; ferritin.myeloma panel kappa lambda light chain ratio.  HOLD bone marrow biopsy until further evaluation pending above.  2025- [place]/EGD- [facility]  # Fatigue-question related to anemia versus others.  Await iron  infusions  # posterior chest wall pain-chronic intermittent for the last 6 months.  Low clinical concern for PE.  Suspect musculoskeletal versus fibromyalgia-declined x-ray.  Follow-up with PCP  # History of bipolar disorder/anxiety depression stable-   Thank you Dr. Alla MD for allowing me to participate in the care of your pleasant patient. Please do not hesitate to contact me with questions or concerns in the interim.  # DISPOSITION: # labs today # weekly venofer  x3  # follow up  2 months-- MD; labs- cbc/bmp; possible venofer - Dr.B  # 45 minutes face-to-face with the patient discussing the above plan of care; more than 50% of time spent on counseling and coordination. My contact information was given; and all questions were answered. The patient knows to call the clinic with any problems, questions or concerns.

## 2024-02-25 NOTE — Progress Notes (Signed)
 [facility] CONSULT NOTE  Patient Care Team: Alla Amis, [name], [name]  CHIEF COMPLAINTS/PURPOSE OF CONSULTATION: ANEMIA   HEMATOLOGY HISTORY  # ANEMIA[Hb; MCV-platelets- WBC; Iron  sat; ferritin;  GFR- CT/US ; EGD/colonoscopy-  HISTORY OF PRESENTING ILLNESS:  Kaylee Gordon 62 y.o.  female with  pleasant patient with history of bipolar disorder/fibromyalgia was been referred to us  for further evaluation of anemia.  Patient noted to be anemic for at least 2 to 3 years.  Also on iron  because of constipation stomach upset.   Blood in stools: intermittent- hx of hemorrhoids [2 times /month]; 2025- Colo/EGD- [[facility]] Blood in urine: none Difficulty swallowing: Kidney or Liver disease: none Alcohol: none Bariatric surgery:none Vaginal bleeding:  none.  Prior evaluation with hematology: none   Review of Systems  Constitutional:  Positive for malaise/fatigue. Negative for chills, diaphoresis, fever and weight loss.  HENT:  Negative for nosebleeds and sore throat.   Eyes:  Negative for double vision.  Respiratory:  Negative for cough, hemoptysis, sputum production, shortness of breath and wheezing.   Cardiovascular:  Negative for chest pain, palpitations, orthopnea and leg swelling.  Gastrointestinal:  Negative for abdominal pain, blood in stool, constipation, diarrhea, heartburn, melena, nausea and vomiting.  Genitourinary:  Negative for dysuria, frequency and urgency.  Musculoskeletal:  Positive for back pain and joint pain.  Skin: Negative.  Negative for itching and rash.  Neurological:  Negative for dizziness, tingling, focal weakness, weakness and headaches.  Endo/Heme/Allergies:  Does not bruise/bleed easily.  Psychiatric/Behavioral:  Negative for depression. The patient is not nervous/anxious and does not have insomnia.      MEDICAL HISTORY:  Past Medical  History:  Diagnosis Date   Anxiety    Asthma    B12 deficiency    Cancer (HCC)    SKIN CANCER   CHF (congestive heart failure) (HCC)    Depression    Hypertension    Thyroid  disease     SURGICAL HISTORY: Past Surgical History:  Procedure Laterality Date   ANKLE SURGERY Right 2012   APPENDECTOMY     AUGMENTATION MAMMAPLASTY Bilateral    BREAST ENHANCEMENT SURGERY     COLONOSCOPY WITH PROPOFOL  N/A 02/22/2015   Procedure: COLONOSCOPY WITH PROPOFOL ;  Surgeon: Donnice Vaughn Manes, MD;  Location: [facility] ENDOSCOPY;  Service: Endoscopy;  Laterality: N/A;   COLONOSCOPY WITH PROPOFOL  N/A 08/08/2022   Procedure: COLONOSCOPY WITH PROPOFOL ;  Surgeon: Maryruth Ole DASEN, MD;  Location: [facility] ENDOSCOPY;  Service: Endoscopy;  Laterality: N/A;   ESOPHAGOGASTRODUODENOSCOPY (EGD) WITH PROPOFOL  N/A 02/22/2015   Procedure: ESOPHAGOGASTRODUODENOSCOPY (EGD) WITH PROPOFOL ;  Surgeon: Donnice Vaughn Manes, MD;  Location: [facility] ENDOSCOPY;  Service: Endoscopy;  Laterality: N/A;   ESOPHAGOGASTRODUODENOSCOPY (EGD) WITH PROPOFOL  N/A 02/11/2016   Procedure: ESOPHAGOGASTRODUODENOSCOPY (EGD) WITH PROPOFOL ;  Surgeon: Lamar DASEN Holmes, MD;  Location: [facility] ENDOSCOPY;  Service: Endoscopy;  Laterality: N/A;   ESOPHAGOGASTRODUODENOSCOPY (EGD) WITH PROPOFOL  N/A 08/08/2022   Procedure: ESOPHAGOGASTRODUODENOSCOPY (EGD) WITH PROPOFOL ;  Surgeon: Maryruth Ole DASEN, MD;  Location: [facility] ENDOSCOPY;  Service: Endoscopy;  Laterality: N/A;   HEMORRHOID SURGERY     NASAL SINUS SURGERY     SAVORY DILATION N/A 02/22/2015   Procedure: SAVORY DILATION;  Surgeon: Donnice Vaughn Manes, MD;  Location: [facility] ENDOSCOPY;  Service: Endoscopy;  Laterality: N/A;   SAVORY DILATION N/A 02/11/2016   Procedure: SAVORY DILATION;  Surgeon: Lamar DASEN Holmes, MD;  Location: [facility] ENDOSCOPY;  Service: Endoscopy;  Laterality: N/A;  SKIN CANCER EXCISION      SOCIAL HISTORY: Social History   Socioeconomic History   Marital status: Married    Spouse name: Ozell    Number of children: Not on file   Years of education: Not on file   Highest education level: Not on file  Occupational History   Not on file  Tobacco Use   Smoking status: Never   Smokeless tobacco: Never  Vaping Use   Vaping status: Never Used  Substance and Sexual Activity   Alcohol use: Not Currently   Drug use: No   Sexual activity: Not Currently    Partners: Male  Other Topics Concern   Not on file  Social History Narrative   Lives in [place]; taught high school- math; retd; no smoking; social alcohol.    Social Drivers of Corporate investment banker Strain: Low Risk  (08/07/2023)   Received from [facility]   Overall Financial Resource Strain (CARDIA)    Difficulty of Paying Living Expenses: Not hard at all  Food Insecurity: No Food Insecurity (02/25/2024)   Hunger Vital Sign    Worried About Running Out of Food in the Last Year: Never true    Ran Out of Food in the Last Year: Never true  Transportation Needs: No Transportation Needs (02/25/2024)   PRAPARE - Administrator, Civil Service (Medical): No    Lack of Transportation (Non-Medical): No  Physical Activity: Unknown (05/16/2018)   Exercise Vital Sign    Days of Exercise per Week: 0 days    Minutes of Exercise per Session: Not on file  Stress: Stress Concern Present (05/16/2018)   [employer] of Occupational Health - Occupational Stress Questionnaire    Feeling of Stress : Very much  Social Connections: Unknown (05/16/2018)   Social Connection and Isolation Panel    Frequency of Communication with Friends and Family: Twice a week    Frequency of Social Gatherings with Friends and Family: Twice a week    Attends Religious Services: More than 4 times per year    Active Member of [employer] or Organizations: Not on file    Attends Banker Meetings: Not on file    Marital Status: Married  Intimate Partner Violence: Not At Risk (02/25/2024)   Humiliation, Afraid,  Rape, and Kick questionnaire    Fear of Current or Ex-Partner: No    Emotionally Abused: No    Physically Abused: No    Sexually Abused: No    FAMILY HISTORY: Family History  Problem Relation Age of Onset   Cancer Mother        colon cancer   Depression Mother    Anxiety disorder Mother    Heart disease Father    Cancer Father        ? neck cancer- still living.    Anxiety disorder Father    Depression Father    Colon cancer Maternal Aunt        died in 66s.    Hypoparathyroidism Son     ALLERGIES:  is allergic to levofloxacin, atorvastatin, hydrochlorothiazide, parafon forte dsc [chlorzoxazone], pollen extract, pseudoephedrine, and sudafed [pseudoephedrine hcl].  MEDICATIONS:  Current Outpatient Medications  Medication Sig Dispense Refill   ALREX 0.2 % SUSP Apply 1 drop to eye 3 (three) times daily.     busPIRone  (BUSPAR ) 10 MG tablet TAKE 1 TABLET BY MOUTH 3 TIMES A DAY AND 1 EXTRA TABLET AS NEEDED FOR SEVERE ANXIETY 120 tablet 1  calcitRIOL  (ROCALTROL ) 0.25 MCG capsule Take 1 capsule (0.25 mcg total) by mouth daily. 90 capsule 3   cyanocobalamin (VITAMIN B12) 1000 MCG tablet Take 1,000 mcg by mouth daily.     escitalopram  (LEXAPRO ) 10 MG tablet Take 1.5 tablets (15 mg total) by mouth daily. 135 tablet 0   esomeprazole (NEXIUM) 40 MG capsule Take 1 capsule by mouth daily.     fenofibrate 160 MG tablet Take 1 tablet by mouth daily.     fluticasone (FLONASE) 50 MCG/ACT nasal spray      gabapentin  (NEURONTIN ) 300 MG capsule Take 300 mg by mouth. 2 CAP AM, 1 CAP Q noon, 2 CAP pm,     isosorbide mononitrate (IMDUR) 30 MG 24 hr tablet      lamoTRIgine  (LAMICTAL ) 200 MG tablet Take 0.5 tablets (100 mg total) by mouth 2 (two) times daily. 90 tablet 1   levothyroxine  (SYNTHROID ) 75 MCG tablet Take 1 tablet (75 mcg total) by mouth daily. 90 tablet 3   lisinopril (ZESTRIL) 10 MG tablet Take 10 mg by mouth daily.     lovastatin (MEVACOR) 20 MG tablet Take 20 mg by mouth at bedtime.      prazosin  (MINIPRESS ) 2 MG capsule Take 1 capsule (2 mg total) by mouth at bedtime. 90 capsule 1   propranolol ER (INDERAL LA) 80 MG 24 hr capsule Take 80 mg by mouth daily.     rOPINIRole  (REQUIP ) 0.25 MG tablet TAKE 1 TABLET BY MOUTH 3 TIMES A DAY 90 tablet 1   temazepam  (RESTORIL ) 30 MG capsule Take 1 capsule (30 mg total) by mouth at bedtime. 30 capsule 2   No current facility-administered medications for this visit.   PHYSICAL EXAMINATION:   Vitals:   02/25/24 1357 02/25/24 1444  BP: (!) 145/129 103/61  Pulse: 66 78  Resp: 20   Temp: 99 F (37.2 C)   SpO2: 98%    Filed Weights   02/25/24 1357  Weight: 161 lb (73 kg)    Physical Exam Vitals and nursing note reviewed.  HENT:     Head: Normocephalic and atraumatic.     Mouth/Throat:     Pharynx: Oropharynx is clear.  Eyes:     Extraocular Movements: Extraocular movements intact.     Pupils: Pupils are equal, round, and reactive to light.  Cardiovascular:     Rate and Rhythm: Normal rate and regular rhythm.  Pulmonary:     Comments: Decreased breath sounds bilaterally.  Abdominal:     Palpations: Abdomen is soft.  Musculoskeletal:        General: Normal range of motion.     Cervical back: Normal range of motion.  Skin:    General: Skin is warm.  Neurological:     General: No focal deficit present.     Mental Status: She is alert and oriented to person, place, and time.  Psychiatric:        Behavior: Behavior normal.        Judgment: Judgment normal.      LABORATORY DATA:  I have reviewed the data as listed Lab Results  Component Value Date   WBC 6.2 02/25/2024   HGB 10.1 (L) 02/25/2024   HCT 32.5 (L) 02/25/2024   MCV 79.3 (L) 02/25/2024   PLT 269 02/25/2024   Recent Labs    07/24/23 1539 02/25/24 1501  NA 142 138  K 3.8 4.0  CL 106 106  CO2 26 23  GLUCOSE 149* 89  BUN 21 24*  CREATININE  1.26* 1.18*  CALCIUM 9.0 9.0  GFRNONAA  --  52*  PROT  --  6.8  ALBUMIN  --  4.0  AST  --  22  ALT   --  12  ALKPHOS  --  71  BILITOT  --  0.5     MM 3D SCREEN BREAST W/IMPLANT BILATERAL Result Date: 02/13/2024 CLINICAL DATA:  Screening. EXAM: DIGITAL SCREENING BILATERAL MAMMOGRAM WITH IMPLANTS, CAD AND TOMOSYNTHESIS TECHNIQUE: Bilateral screening digital craniocaudal and mediolateral oblique mammograms were obtained. Bilateral screening digital breast tomosynthesis was performed. The images were evaluated with computer-aided detection. Standard and/or implant displaced views were performed. COMPARISON:  Previous exam(s). ACR Breast Density Category b: There are scattered areas of fibroglandular density. FINDINGS: The patient has retropectoral implants. There are no findings suspicious for malignancy. IMPRESSION: No mammographic evidence of malignancy. A result letter of this screening mammogram will be mailed directly to the patient. RECOMMENDATION: Screening mammogram in one year. (Code:SM-B-01Y) BI-RADS CATEGORY  1: Negative. Electronically Signed   By: Reyes Phi M.D.   On: 02/13/2024 07:50    ASSESSMENT & PLAN:   Symptomatic anemia # Moderate anemia hemoglobin 10- ; platelets -WNL.  [JULY 2025-PCP]; GFR  54  calcium normal.  However I had a long discussion with the patient regarding etiology of unexplained anemia-benign etiologies like iron  deficiency etc..  Low clinical concern for any malignant process.   Recommend checking CBC CMP LDH haptoglobin B12 folic acid reticulocyte count; Iron  studies; ferritin.myeloma panel kappa lambda light chain ratio.  HOLD bone marrow biopsy until further evaluation pending above.  2025- [facility]o/EGD- [facility]  # Fatigue-question related to anemia versus others.  Await iron  infusions  # posterior chest wall pain-chronic intermittent for the last 6 months.  Low clinical concern for PE.  Suspect musculoskeletal versus fibromyalgia-declined x-ray.  Follow-up with PCP  # History of bipolar disorder/anxiety depression stable-   Thank you Dr.  Alla MD for allowing me to participate in the care of your pleasant patient. Please do not hesitate to contact me with questions or concerns in the interim.  # DISPOSITION: # labs today # weekly venofer  x3  # follow up  2 months-- MD; labs- cbc/bmp; possible venofer - Dr.B  # 45 minutes face-to-face with the patient discussing the above plan of care; more than 50% of time spent on counseling and coordination. My contact information was given; and all questions were answered. The patient knows to call the clinic with any problems, questions or concerns.    Cindy JONELLE Joe, MD 02/25/2024 3:45 PM

## 2024-02-26 LAB — KAPPA/LAMBDA LIGHT CHAINS
Kappa free light chain: 61.2 mg/L — ABNORMAL HIGH (ref 3.3–19.4)
Kappa, lambda light chain ratio: 9.71 — ABNORMAL HIGH (ref 0.26–1.65)
Lambda free light chains: 6.3 mg/L (ref 5.7–26.3)

## 2024-02-26 LAB — HAPTOGLOBIN: Haptoglobin: 175 mg/dL (ref 37–355)

## 2024-02-26 LAB — ERYTHROPOIETIN: Erythropoietin: 45.5 m[IU]/mL — ABNORMAL HIGH (ref 2.6–18.5)

## 2024-02-27 ENCOUNTER — Ambulatory Visit: Payer: Self-pay | Admitting: Internal Medicine

## 2024-02-28 LAB — MULTIPLE MYELOMA PANEL, SERUM
Albumin SerPl Elph-Mcnc: 3.6 g/dL (ref 2.9–4.4)
Albumin/Glob SerPl: 1.3 (ref 0.7–1.7)
Alpha 1: 0.3 g/dL (ref 0.0–0.4)
Alpha2 Glob SerPl Elph-Mcnc: 0.8 g/dL (ref 0.4–1.0)
B-Globulin SerPl Elph-Mcnc: 1.2 g/dL (ref 0.7–1.3)
Gamma Glob SerPl Elph-Mcnc: 0.6 g/dL (ref 0.4–1.8)
Globulin, Total: 2.9 g/dL (ref 2.2–3.9)
IgA: 50 mg/dL — ABNORMAL LOW (ref 87–352)
IgG (Immunoglobin G), Serum: 586 mg/dL (ref 586–1602)
IgM (Immunoglobulin M), Srm: 87 mg/dL (ref 26–217)
Total Protein ELP: 6.5 g/dL (ref 6.0–8.5)

## 2024-02-29 ENCOUNTER — Inpatient Hospital Stay

## 2024-02-29 ENCOUNTER — Encounter: Payer: Self-pay | Admitting: Internal Medicine

## 2024-02-29 VITALS — BP 112/69 | HR 77 | Temp 99.0°F | Resp 18

## 2024-02-29 DIAGNOSIS — D649 Anemia, unspecified: Secondary | ICD-10-CM

## 2024-02-29 MED ORDER — IRON SUCROSE 20 MG/ML IV SOLN
200.0000 mg | Freq: Once | INTRAVENOUS | Status: AC
  Administered 2024-02-29: 200 mg via INTRAVENOUS
  Filled 2024-02-29: qty 10

## 2024-02-29 MED ORDER — SODIUM CHLORIDE 0.9% FLUSH
10.0000 mL | Freq: Once | INTRAVENOUS | Status: AC | PRN
  Administered 2024-02-29: 10 mL
  Filled 2024-02-29: qty 10

## 2024-02-29 NOTE — Progress Notes (Signed)
 Spoke to patient and husband regarding the abnormal results of the myeloma panel-not concerning for any significant hematologic problem at this time.  Discussed regarding possible need for a bone marrow biopsy for imaging-down the line.   For now continue with iron  infusions follow-up as planned.

## 2024-03-03 ENCOUNTER — Encounter: Payer: Self-pay | Admitting: Internal Medicine

## 2024-03-03 ENCOUNTER — Other Ambulatory Visit: Payer: Self-pay | Admitting: *Deleted

## 2024-03-03 ENCOUNTER — Encounter: Payer: Self-pay | Admitting: *Deleted

## 2024-03-03 MED ORDER — ONDANSETRON HCL 8 MG PO TABS: 8.0000 mg | ORAL_TABLET | Freq: Three times a day (TID) | ORAL | 0 refills | Status: DC | PRN

## 2024-03-05 ENCOUNTER — Ambulatory Visit: Payer: Self-pay

## 2024-03-07 ENCOUNTER — Telehealth: Payer: Self-pay | Admitting: Internal Medicine

## 2024-03-07 ENCOUNTER — Inpatient Hospital Stay

## 2024-03-07 ENCOUNTER — Other Ambulatory Visit: Payer: Self-pay | Admitting: Internal Medicine

## 2024-03-07 NOTE — Progress Notes (Signed)
 Poor tolerance to IV Venofer -significant diarrhea as per patient.  Hold Venofer  today- 8/22.  Switch to Feraheme-starting next week.  Please schedule Feraheme next visit  GB

## 2024-03-07 NOTE — Telephone Encounter (Signed)
 Notes changed on appt notes for 8/29. Per Roderick, RN, Dr.B is switching pt to feraheme.

## 2024-03-07 NOTE — Progress Notes (Signed)
 Pt presented to clinic today for venofer .  Pt stated that after treatment last Friday she had uncontrollable nausea and diarrhea x 3 days followed by 2 days of complete exhaustion.  Pt stated she was petrified to come to treatment today and her son is visiting from [place]  and she is worried she won't be able to enjoy his visit.  Dr Rennie notified of pt's concerns.  Plan to switch treatment to feraheme next week and hold venofer  today.  Dr Rennie to change treatment plan.  Pt agree's with plan.

## 2024-03-10 ENCOUNTER — Other Ambulatory Visit: Payer: Self-pay | Admitting: Psychiatry

## 2024-03-10 DIAGNOSIS — G4701 Insomnia due to medical condition: Secondary | ICD-10-CM

## 2024-03-10 NOTE — Telephone Encounter (Signed)
 pt left a message that she needs a refill on the temazepam . pt was last seen on 6-18 next appt 9-2

## 2024-03-12 ENCOUNTER — Telehealth: Payer: Self-pay | Admitting: Internal Medicine

## 2024-03-12 NOTE — Telephone Encounter (Signed)
 Called patient to cancel infusion appointment for 8/29 due to insurance denial. Left patient a voicemail informing her of cancellation. Asked her to call clinic if there were any questions.

## 2024-03-14 ENCOUNTER — Inpatient Hospital Stay

## 2024-03-18 ENCOUNTER — Encounter: Payer: Self-pay | Admitting: Psychiatry

## 2024-03-18 ENCOUNTER — Ambulatory Visit (INDEPENDENT_AMBULATORY_CARE_PROVIDER_SITE_OTHER): Admitting: Psychiatry

## 2024-03-18 VITALS — BP 124/82 | HR 89 | Temp 97.3°F | Ht 64.0 in | Wt 157.4 lb

## 2024-03-18 DIAGNOSIS — F411 Generalized anxiety disorder: Secondary | ICD-10-CM

## 2024-03-18 DIAGNOSIS — F3176 Bipolar disorder, in full remission, most recent episode depressed: Secondary | ICD-10-CM | POA: Diagnosis not present

## 2024-03-18 DIAGNOSIS — G4701 Insomnia due to medical condition: Secondary | ICD-10-CM

## 2024-03-18 MED ORDER — BUSPIRONE HCL 10 MG PO TABS: 40.0000 mg | ORAL_TABLET | ORAL | 2 refills | Status: DC

## 2024-03-18 MED ORDER — ESCITALOPRAM OXALATE 10 MG PO TABS: 15.0000 mg | ORAL_TABLET | Freq: Every day | ORAL | 0 refills | Status: DC

## 2024-03-18 NOTE — Progress Notes (Signed)
 [facility] MD OP Progress Note  03/18/2024 4:33 PM Kaylee Gordon  MRN:  [id number]  Chief Complaint:  Chief Complaint  Patient presents with   Follow-up   Depression   Anxiety   Medication Refill   Discussed the use of AI scribe software for clinical note transcription with the patient, who gave verbal consent to proceed.  History of Present Illness Kaylee Gordon is a 63 year old presenting with anxiety, insomnia, and psychosocial stressors.  Describing significant anxiety, she links her symptoms to multiple ongoing stressors, including her daughter's severe health anxiety and medical issues, her own recent health concerns, and anticipatory grief related to her elderly dog's declining health. She notes that her daughter's anxiety has been severe, with daily crying and elevated blood pressure, and she has taken an active role in securing care for her daughter, which has contributed to her own distress. After reviewing her own medical test results, she experienced a recent period of heightened anxiety, fearing she might have blood cancer. She states that these stressors have left her and her family feeling overwhelmed, except for her son, who lives in [place]  and reportedly is doing well.  She reports that her sleep remains poor, with difficulty falling asleep, staying asleep, and waking up in the morning. She estimates she gets 7 to 8 hours of sleep per night with the aid of temazepam , but feels she functions better with 10 hours. She notes poor sleep quality despite medication. She continues to use temazepam  and states that she would not be able to sleep without it.  Her current medication regimen includes temazepam , ropinirole , prazosin  2 mg, propranolol 80 mg, lamotrigine  100 mg half tablet twice daily, gabapentin  300 mg (1 capsule in the afternoon and 2 at bedtime), and buspirone . She also uses clonazepam  as needed for severe anxiety, but clarifies that she primarily takes buspirone . She  describes that her current medications help her manage her symptoms, particularly sleep and anxiety.  Identifying several psychosocial stressors, she reports her husband's lack of support regarding her medical issues and the impending loss of her dog, who is 54 years old and in declining health. She remains engaged in therapy with [facility] and finds it helpful, though she recently missed an appointment due to her daughter's health crisis. She denies any thoughts of self-harm or suicidality, stating that she is too focused on her daughter's needs.  Substance History: She denies current or past alcohol use, binge drinking, cannabis, or cocaine use.  Social History: She lives with her husband and has two adult children, one of whom lives in [place] . Her husband accompanies her to medical appointments. She owns a dog. She reports difficulty with physical exertion such as climbing stairs.  Medical History: Anemia. Cataract surgery in March and April.  Visit Diagnosis:    ICD-10-CM   1. Bipolar 1 disorder, depressed, full remission (HCC)  F31.76     2. GAD (generalized anxiety disorder)  F41.1 escitalopram  (LEXAPRO ) 10 MG tablet    busPIRone  (BUSPAR ) 10 MG tablet    3. 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  Past trials of medications like Ambien, Lunesta , Sonata , Belsomra   Past Medical History:  Past Medical History:  Diagnosis Date   Anxiety    Asthma    B12 deficiency    Cancer (HCC)    SKIN CANCER   CHF (congestive  heart failure) (HCC)    Depression    Hypertension    Thyroid  disease     Past Surgical History:  Procedure Laterality Date   ANKLE SURGERY Right 2012   APPENDECTOMY     AUGMENTATION MAMMAPLASTY Bilateral    BREAST ENHANCEMENT SURGERY     COLONOSCOPY WITH PROPOFOL  N/A 02/22/2015   Procedure: COLONOSCOPY WITH PROPOFOL ;  Surgeon: Donnice Vaughn Manes, MD;  Location: [facility] ENDOSCOPY;  Service: Endoscopy;  Laterality: N/A;   COLONOSCOPY WITH PROPOFOL  N/A 08/08/2022   Procedure: COLONOSCOPY WITH PROPOFOL ;  Surgeon: Maryruth Ole DASEN, MD;  Location: [facility] ENDOSCOPY;  Service: Endoscopy;  Laterality: N/A;   ESOPHAGOGASTRODUODENOSCOPY (EGD) WITH PROPOFOL  N/A 02/22/2015   Procedure: ESOPHAGOGASTRODUODENOSCOPY (EGD) WITH PROPOFOL ;  Surgeon: Donnice Vaughn Manes, MD;  Location[facility] ENDOSCOPY;  Service: Endoscopy;  Laterality: N/A;   ESOPHAGOGASTRODUODENOSCOPY (EGD) WITH PROPOFOL  N/A 02/11/2016   Procedure: ESOPHAGOGASTRODUODENOSCOPY (EGD) WITH PROPOFOL ;  Surgeon: Lamar DASEN Holmes, MD;  Location: [facility] ENDOSCOPY;  Service: Endoscopy;  Laterality: N/A;   ESOPHAGOGASTRODUODENOSCOPY (EGD) WITH PROPOFOL  N/A 08/08/2022   Procedure: ESOPHAGOGASTRODUODENOSCOPY (EGD) WITH PROPOFOL ;  Surgeon: Maryruth Ole DASEN, MD;  Location: [facility] ENDOSCOPY;  Service: Endoscopy;  Laterality: N/A;   HEMORRHOID SURGERY     NASAL SINUS SURGERY     SAVORY DILATION N/A 02/22/2015   Procedure: SAVORY DILATION;  Surgeon: Donnice Vaughn Manes, MD;  Location: [facility] ENDOSCOPY;  Service: Endoscopy;  Laterality: N/A;   SAVORY DILATION N/A 02/11/2016   Procedure: SAVORY DILATION;  Surgeon: Lamar DASEN Holmes, MD;  Location: [facility] ENDOSCOPY;  Service: Endoscopy;  Laterality: N/A;   SKIN CANCER EXCISION      Family Psychiatric History: I have reviewed family psychiatric history from progress note on 09/11/2018  Family History:  Family History  Problem Relation Age of Onset   Cancer Mother        colon cancer   Depression Mother    Anxiety disorder Mother    Heart disease Father    Cancer Father        ? neck cancer- still living.    Anxiety disorder Father    Depression Father    Colon cancer Maternal Aunt        died in 23s.    Hypoparathyroidism Son     Social History: I have reviewed social history from progress note on 09/11/2018 Social History   Socioeconomic History    Marital status: Married    Spouse name: Ozell   Number of children: Not on file   Years of education: Not on file   Highest education level: Not on file  Occupational History   Not on file  Tobacco Use   Smoking status: Never   Smokeless tobacco: Never  Vaping Use   Vaping status: Never Used  Substance and Sexual Activity   Alcohol use: Not Currently   Drug use: No   Sexual activity: Not Currently    Partners: Male  Other Topics Concern   Not on file  Social History Narrative   Lives in [place]; taught high school- math; retd; no smoking; social alcohol.    Social Drivers of Corporate investment banker Strain: Low Risk  (08/07/2023)   Received from [facility]   Overall Financial Resource Strain (CARDIA)    Difficulty of Paying Living Expenses: Not hard at all  Food Insecurity: No Food Insecurity (02/25/2024)   Hunger Vital Sign    Worried About Running Out of Food in the Last Year: Never true  Ran Out of Food in the Last Year: Never true  Transportation Needs: No Transportation Needs (02/25/2024)   PRAPARE - Administrator, Civil Service (Medical): No    Lack of Transportation (Non-Medical): No  Physical Activity: Unknown (05/16/2018)   Exercise Vital Sign    Days of Exercise per Week: 0 days    Minutes of Exercise per Session: Not on file  Stress: Stress Concern Present (05/16/2018)   [employer] of Occupational Health - Occupational Stress Questionnaire    Feeling of Stress : Very much  Social Connections: Unknown (05/16/2018)   Social Connection and Isolation Panel    Frequency of Communication with Friends and Family: Twice a week    Frequency of Social Gatherings with Friends and Family: Twice a week    Attends Religious Services: More than 4 times per year    Active Member of [employer] or Organizations: Not on file    Attends Banker Meetings: Not on file    Marital Status: Married    Allergies:  Allergies   Allergen Reactions   Levofloxacin Other (See Comments) and Nausea Only    Lethargic, Flu-like symptoms, Hot flashes    Atorvastatin    Hydrochlorothiazide Other (See Comments)    Intolerance - dry lips   Parafon Forte Dsc [Chlorzoxazone]    Pollen Extract    Pseudoephedrine Other (See Comments)   Sudafed [Pseudoephedrine Hcl]     Heart racing    Metabolic Disorder Labs: Lab Results  Component Value Date   HGBA1C 5.9 (H) 02/16/2021   Lab Results  Component Value Date   PROLACTIN 18.3 07/22/2014   Lab Results  Component Value Date   CHOL 184 02/16/2021   TRIG 179 (H) 02/16/2021   HDL 37 (L) 02/16/2021   CHOLHDL 5.0 (H) 02/16/2021   LDLCALC 115 (H) 02/16/2021   Lab Results  Component Value Date   TSH 1.94 02/12/2024   TSH 2.03 07/24/2023    Therapeutic Level Labs: No results found for: LITHIUM Lab Results  Component Value Date   VALPROATE 39 (L) 02/04/2019   No results found for: CBMZ  Current Medications: Current Outpatient Medications  Medication Sig Dispense Refill   ALREX 0.2 % SUSP Apply 1 drop to eye 3 (three) times daily.     Besifloxacin HCl (BESIVANCE) 0.6 % SUSP STARTING 3 DAYS PRIOR TO SURGERY INSTILL 1 DROP 3 TIMES PER DAY INTO OPERATIVE EYE AS DIRECTED     bimatoprost (LUMIGAN) 0.03 % ophthalmic solution SMARTSIG:In Eye(s)     Bromfenac Sodium 0.07 % SOLN STARTING 3 DAYS PRIOR TO SURGERY INSTILL 1 DROP INTO OPERATIVE EYE TWICE DAILY AS DIRECTED     calcitRIOL  (ROCALTROL ) 0.25 MCG capsule Take 1 capsule (0.25 mcg total) by mouth daily. 90 capsule 3   cyanocobalamin (VITAMIN B12) 1000 MCG tablet Take 1,000 mcg by mouth daily.     esomeprazole (NEXIUM) 40 MG capsule Take 1 capsule by mouth daily.     fenofibrate 160 MG tablet Take 1 tablet by mouth daily.     fluticasone (FLONASE) 50 MCG/ACT nasal spray      gabapentin  (NEURONTIN ) 300 MG capsule Take 300 mg by mouth. 2 CAP AM, 1 CAP Q noon, 2 CAP pm,     isosorbide mononitrate (IMDUR) 30 MG 24 hr  tablet      lamoTRIgine  (LAMICTAL ) 200 MG tablet Take 0.5 tablets (100 mg total) by mouth 2 (two) times daily. 90 tablet 1   levothyroxine  (SYNTHROID ) 75 MCG tablet  Take 1 tablet (75 mcg total) by mouth daily. 90 tablet 3   lisinopril (ZESTRIL) 10 MG tablet Take 10 mg by mouth daily.     lovastatin (MEVACOR) 20 MG tablet Take 20 mg by mouth at bedtime.     ondansetron  (ZOFRAN ) 8 MG tablet Take 1 tablet (8 mg total) by mouth every 8 (eight) hours as needed for nausea or vomiting (nausea related to iron  infusions). 60 tablet 0   prazosin  (MINIPRESS ) 2 MG capsule Take 1 capsule (2 mg total) by mouth at bedtime. 90 capsule 1   propranolol ER (INDERAL LA) 80 MG 24 hr capsule Take 80 mg by mouth daily.     RESTASIS 0.05 % ophthalmic emulsion 1 drop 2 (two) times daily.     rOPINIRole  (REQUIP ) 0.25 MG tablet TAKE 1 TABLET BY MOUTH 3 TIMES A DAY 90 tablet 1   temazepam  (RESTORIL ) 30 MG capsule Take 1 capsule (30 mg total) by mouth at bedtime. 30 capsule 2   busPIRone  (BUSPAR ) 10 MG tablet Take 4 tablets (40 mg total) by mouth as directed. TAKE 1 TABLET BY MOUTH 3 TIMES A DAY AND 1 EXTRA TABLET AS NEEDED FOR SEVERE ANXIETY 120 tablet 2   cycloSPORINE (VEVYE) 0.1 % SOLN Instill ONE drop IN EACH EYE TWICE DAILY     escitalopram  (LEXAPRO ) 10 MG tablet Take 1.5 tablets (15 mg total) by mouth daily. 135 tablet 0   No current facility-administered medications for this visit.     Musculoskeletal: Strength & Muscle Tone: within normal limits Gait & Station: normal Patient leans: N/A  Psychiatric Specialty Exam: Review of Systems  Blood pressure 124/82, pulse 89, temperature (!) 97.3 F (36.3 C), temperature source Temporal, height 5' 4 (1.626 m), weight 157 lb 6.4 oz (71.4 kg), SpO2 92%.Body mass index is 27.02 kg/m.  General Appearance: Casual  Eye Contact:  Good  Speech:  Clear and Coherent  Volume:  Normal  Mood:  Anxious  Affect:  Congruent  Thought Process:  Goal Directed and Descriptions of  Associations: Intact  Orientation:  Full (Time, Place, and Person)  Thought Content: Logical   Suicidal Thoughts:  No  Homicidal Thoughts:  No  Memory:  Immediate;   Fair Recent;   Fair Remote;   Fair  Judgement:  Fair  Insight:  Fair  Psychomotor Activity:  Normal  Concentration:  Concentration: Fair and Attention Span: Fair  Recall:  [employer] of Knowledge: Fair  Language: Fair  Akathisia:  No  Handed:  Right  AIMS (if indicated): not done  Assets:  Communication Skills Desire for Improvement Housing Talents/Skills  ADL's:  Intact  Cognition: WNL  Sleep:  Fair   Screenings: AIMS    Flowsheet Row Video Visit from 12/08/2021 in [facility]  AIMS Total Score 0   GAD-7    Flowsheet Row Office Visit from 03/18/2024 in [facility] Office Visit from 04/11/2023 in [facility] Office Visit from 11/21/2022 in [facility] Video Visit from 03/28/2022 in [facility] Video Visit from 02/06/2022 in [facility]  Total GAD-7 Score 21 21 14 10 7    PHQ2-9    Flowsheet Row Office Visit from 03/18/2024 in [facility] Office Visit from 02/25/2024 in [facility] Office Visit from 04/11/2023 in [facility] Office Visit from 11/21/2022 in [facility] Video Visit from 06/27/2022 in [facility]  PHQ-2 Total Score 3 6 2 2  0  PHQ-9 Total Score 18 15 11 11  --   Flowsheet Row Video Visit from 01/02/2024 in [facility] UC from 12/11/2023 in [facility]  Video Visit from 12/07/2023 in [facility]  C-SSRS RISK CATEGORY No Risk No Risk No Risk     Assessment and Plan: ***  Collaboration of Care: Collaboration of Care: [facility] Collaboration of Rjmz:[id number]}  Patient/Guardian was advised Release of Information must be obtained prior to any record release in order to collaborate their care with an outside provider. Patient/Guardian was advised if they have not already done so to contact the registration department to sign all necessary forms in order for us  to release information regarding their care.   Consent: Patient/Guardian gives verbal consent for treatment and assignment of benefits for services provided during this visit. Patient/Guardian expressed understanding and agreed to proceed.    Lizza Huffaker, MD 03/18/2024, 4:33 PM

## 2024-03-19 ENCOUNTER — Encounter: Payer: Self-pay | Admitting: Internal Medicine

## 2024-03-19 ENCOUNTER — Telehealth: Payer: Self-pay | Admitting: *Deleted

## 2024-03-19 DIAGNOSIS — D649 Anemia, unspecified: Secondary | ICD-10-CM

## 2024-03-19 NOTE — Telephone Encounter (Signed)
 Patient called saying that she is having some breathing issues and the breathing is one of the problems that the doctor told her low iron .  He says that it is worse when she has to go up or down steps and when in the shower with the hot water.  Sent over here for IV iron .  The first 1 they tried to give her she had some nausea.  And they are working on trying to get her another IV iron  that the insurance covers for her.  She feels that all of this is because of the iron  and she would like to get the next IV iron  to see if that helps her

## 2024-03-20 ENCOUNTER — Telehealth: Payer: Self-pay | Admitting: *Deleted

## 2024-03-20 NOTE — Telephone Encounter (Signed)
 Patient calling to inquire about her Iron  infusion appts. She wonders if insurance has authorized iron  for her. Per a secure chat conversation on 03/19/24; [facility] is working on drug assistance since [place] denied feraheme. [employer] wants patient to try and fail venofer  and infed (both preferred) before they will approve a non preferred . I was told we do not administer infed at this facility.

## 2024-03-21 NOTE — Progress Notes (Signed)
 Chief Complaint  Patient presents with  . Nasal Congestion    CLEAR  . Shortness of Breath  . Fatigue    Patient is agreeable to [employer] scribe.   History of Present Illness Kaylee Gordon is a 63 year old female with anemia and asthma who presents with worsening shortness of breath.  She experiences progressively worsening shortness of breath, impacting daily activities such as climbing stairs and walking short distances. She feels exhausted and unable to breathe properly during simple activities like brushing her teeth. An emergency inhaler provides some relief but causes jitteriness, and she uses it every two to three days when symptoms are severe.  No chest pains or recent long distance travel.  No leg edema.  She has a history of anemia and recently had an iron  infusion at the cancer center, which resulted in a reaction. She is awaiting insurance approval for another infusion. Significant fatigue accompanies her breathing difficulties. She has contacted the cancer center regarding her symptoms but has been told they are waiting on insurance.  No chest pain is present, but she reports a low-grade fever when visiting the cancer center, although her temperature has been normal at home and during a recent visit to her psychiatrist. She has a history of asthma, for which she previously used an inhaler like Advair three times a day between 2008 and 2016.  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 She last saw a pulmonologist in 2019 for lung nodules, which were biopsied and found to be benign.  She has undergone extensive testing at the cancer center, including a CT scan last year for swallowing issues, which showed dependent atelectasis. She has not had a chest x-ray in several years.    ROS  Review of systems is unremarkable for  any active cardiac, respiratory, GI, GU, hematologic, neurologic, dermatologic, HEENT, or psychiatric symptoms except as noted above.  No fevers, chills, or constitutional symptoms.   Current Outpatient Medications  Medication Sig Dispense Refill  . albuterol  MDI, PROVENTIL , VENTOLIN , PROAIR , HFA 90 mcg/actuation inhaler Inhale 2 inhalations into the lungs every 4 (four) hours as needed for Wheezing or Shortness of Breath 1 each 0  . ALREX 0.2 % DrpS ophthalmic suspension Instill ONE drop IN EACH EYE THREE TIMES DAILY    . BESIVANCE 0.6 % STARTING 3 DAYS PRIOR TO SURGERY INSTILL 1 DROP 3 TIMES PER DAY INTO OPERATIVE EYE AS DIRECTED    . bimatoprost (LUMIGAN) 0.03 % ophthalmic solution SMARTSIG:In Eye(s)    . bromfenac (PROLENSA) 0.07 % ophthalmic solution STARTING 3 DAYS PRIOR TO SURGERY INSTILL 1 DROP INTO OPERATIVE EYE TWICE DAILY AS DIRECTED    . busPIRone  (BUSPAR ) 10 MG tablet Take 10 mg by mouth 2 (two) times daily    . calcitRIOL  (ROCALTROL ) 0.25 MCG capsule Take 0.25 mcg by mouth once daily. Reported on 09/14/2015     . CALCIUM CARBONATE (CALCIUM 500 ORAL) Take 1 tablet by mouth once daily.      . cyanocobalamin (VITAMIN B12) 1000 MCG tablet Take 1 tablet (1,000 mcg total) by mouth once daily    . cycloSPORINE (VEVYE) 0.1 % ophthalmic solution Place 1 drop into both eyes 2 (two) times daily    . escitalopram  oxalate (LEXAPRO ) 10 MG tablet Take 10 mg by mouth once daily    . esomeprazole (NEXIUM)  40 MG DR capsule TAKE 1 CAPSULE BY MOUTH DAILY 90 capsule 3  . fenofibrate 160 MG tablet Take 1 tablet (160 mg total) by mouth once daily 100 tablet 1  . fluticasone propionate (FLONASE) 50 mcg/actuation nasal spray Place 2 sprays into both nostrils 2 (two) times daily    . gabapentin  (NEURONTIN ) 300 MG capsule TAKE TWO CAPSULES BY MOUTH EVERY MORNING, ONE CAPSULE DAILY AT MIDDAY, THEN TWO CAPSULES EVERY EVENING 450 capsule 1  . lamoTRIgine  (LAMICTAL ) 100 MG tablet Take 150 mg by mouth 2 (two)  times daily    . levothyroxine  (SYNTHROID ) 75 MCG tablet Take 75 mcg by mouth once daily Take 1 tablet (75 mcg total) by mouth daily before breakfast.    . lisinopriL (ZESTRIL) 5 MG tablet Take 5 mg by mouth once daily    . lovastatin (MEVACOR) 20 MG tablet TAKE 1 TABLET BY MOUTH AT BEDTIME 90 tablet 1  . ondansetron  (ZOFRAN ) 8 MG tablet Take 8 mg by mouth    . peg 400-propylene glycol, PF, (SYSTANE ULTRA) 0.4-0.3 % ophthalmic drops Place 1 drop into both eyes as needed for Dry Eyes    . prazosin  (MINIPRESS ) 2 MG capsule Take 2 mg by mouth at bedtime    . prednisoLONE acetate (PRED FORTE) 1 % ophthalmic suspension     . propranoloL (INDERAL LA) 80 MG 24 hr capsule Take 1 capsule (80 mg total) by mouth once daily 100 capsule 1  . RESTASIS 0.05 % ophthalmic emulsion 1 drop 2 (two) times daily    . rOPINIRole  (REQUIP ) 0.25 MG immediate release tablet Take 0.25 mg by mouth 3 (three) times daily    . temazepam  (RESTORIL ) 15 mg capsule Take 15 mg by mouth at bedtime    . fluticasone propion-salmeteroL (ADVAIR DISKUS) 250-50 mcg/dose diskus inhaler Inhale 1 Puff into the lungs every 12 (twelve) hours 60 each 0   No current facility-administered medications for this visit.    Allergies as of 03/21/2024 - Reviewed 03/21/2024  Allergen Reaction Noted  . Levofloxacin Dizziness and Nausea 10/04/2020  . Atorvastatin Other (See Comments) 01/29/2014  . Hydrochlorothiazide Other (See Comments) 03/17/2019  . Parafon forte [chlorzoxazone] Hives 01/29/2014  . Pollens extract Other (See Comments) 02/03/2015  . Pseudoephedrine hcl Palpitations 02/14/2013    Patient Active Problem List  Diagnosis  . Allergic rhinitis  . Acquired hypothyroidism, unspecified (TSH 1 - 08/05/20) - followed by Dr. Salena Sor ([place])  . Anxiety - followed by psychiatry (Dr. Coby)  . Benign essential hypertension  . Mixed hyperlipidemia (LDL 96 - 01/31/24)  . Polyarthralgia  . Fibromyalgia syndrome (Dx 01/2017 by Dr. Ione - not  currently seeing Rheumatology)  . Iron  deficiency anemia secondary to inadequate dietary iron  intake (Hgb 10.3, Iron  30 - 01/31/24) - intolerant to oral iron   . B12 deficiency (700 - 01/31/24)  . Bipolar affective disorder, currently depressed, mild (CMS-HCC) - followed by Dr. Coby  . Depression, major, recurrent, moderate (CMS/HHS-HCC)  . Tremor  . Skull lesion  . History of colonic polyps  . Prolonged Q-T interval on ECG  . Medicare annual wellness visit, initial 03/27/22  . Stage 3a chronic kidney disease (Cr 1.1, GFR 56 - 01/31/24)  . Arthralgia of left knee  . Arthritis of left hip  . Arthritis of left knee  . At risk for prolonged QT interval syndrome  . Bereavement  . Bipolar 1 disorder, depressed, partial remission (CMS/HHS-HCC)  . Bipolar disorder, in full remission, most recent episode depressed (CMS/HHS-HCC)  .  Closed fracture of fifth metatarsal bone  . Closed fracture of fourth metatarsal bone  . Closed fracture of second metatarsal bone  . Closed fracture of third metatarsal bone  . Elevated blood pressure reading  . History of prolonged Q-T interval on ECG  . Insomnia due to medical condition  . Localized edema  . Muscle strain of left hip  . Pain of left hip joint  . Prolonged QT syndrome  . Trochanteric bursitis of left hip  . Bipolar 1 disorder, depressed, moderate (CMS/HHS-HCC)  . Fibromyalgia  . Closed fracture of proximal phalanx of great toe    Past Medical History:  Diagnosis Date  . Allergic rhinitis   . Anxiety   . Arthritis   . Asthma without status asthmaticus (HHS-HCC)   . Atypical chest pain    on/off x 1 year  . Cancer (CMS/HHS-HCC)    melanoma  . Colon polyps    hyperplastic as well as adenomatous  . Depression   . External hemorrhoids 02/22/2015  . Fibromyalgia   . Gastritis 02/22/2015  . GERD (gastroesophageal reflux disease)   . HH (hiatus hernia) 02/22/2015  . History of melanoma   . Hyperlipidemia   . Hypertension   .  Hypoparathyroidism (CMS/HHS-HCC)   . Hypothyroidism diagnosed 06/2010  . Internal hemorrhoids 02/22/2015  . Reflux esophagitis 02/22/2015  . Tubular adenoma of colon 02/22/2015    Past Surgical History:  Procedure Laterality Date  . COLONOSCOPY N/A 11/01/2009   Dr. SHAUNNA. Oh @ [facility] (09/12/2004, 08/12/2002) - Adenomatous Polyps, FHCC, FHPolyps  . COLONOSCOPY  02/22/2015   Dr. EMERSON Manes @ [facility]- Adenomatous Polyps, FHCC(m), FHPolyps(f): [facility] 02/2018  . EGD  02/22/2015   Reflux Esophagitis: No repeat per [facility]  . EGD  02/11/2016   Gastritis: No repeat per [facility]  . COLONOSCOPY N/A 03/19/2018   Dr. EMERSON Manes @ [facility] - Multi. Adenomatous Polyps, one Sessile Serrated, rpt 2 yrs per [facility]  . Colon @ [facility]  08/08/2022   Tubular adenoma/PHx CP/Repeat 46yrs/CTL  . EGD @ [facility]  08/08/2022   Normal EGD biopsies/Repeat PRN/CTL  . EGD@[facility]   07/26/2023   HH/RptPRN/CTL  . Anal fissure repair    . APPENDECTOMY    . ARTHROSCOPY ANKLE FOR REPAIR FRACTURE Right   . AUGMENTATION MAMMAPLASTY    . Breast implant 1990    . BREAST SURGERY    . CESAREAN SECTION  1995, 1999  . Deviated septum with rhinoplasty 1996    . HEMORRHOIDECTOMY BY SIMPLE LIGATION    . KNEE ARTHROSCOPY Left    cartilage repair  . Melanoma excision from left arm, 1983    . tooth Implant      Vitals:   03/21/24 1353  BP: 112/62  Pulse: 61  SpO2: 91%  Weight: 71.3 kg (157 lb 3.2 oz)  Height: 163.8 cm (5' 4.5)  PainSc: 0-No pain   Body mass index is 26.57 kg/m.  Exam BP 112/62 (BP Location: Left upper arm, Patient Position: Sitting, BP Cuff Size: Adult)   Pulse 61   Ht 163.8 cm (5' 4.5)   Wt 71.3 kg (157 lb 3.2 oz)   SpO2 91%   BMI 26.57 kg/m   General. Well appearing; NAD; VS reviewed     Eyes. Sclera and conjunctiva clear; Vision grossly intact; extraocular movements intact Oropharynx. No suspicious lesions Neck. Supple. No swelling, masses, thyroid  normal size, no masses palpated.   Lungs. Respirations unlabored; mild intermittent  rhonchi but no pronounced crackle. Cardiovascular. Heart regular rate  and rhythm without murmurs, gallops, or rubs Extremities: without edema and with 2+ pulses bilaterally Skin. Normal color and turgor Neurologic. Alert and oriented x3; CN 2-12 grossly intact; no focal deficits  Assessment & Plan  Shortness of breath and fatigue, likely multifactorial (anemia, possible asthma or COPD) Worsening shortness of breath and fatigue likely due to anemia and possible obstructive airway disease. Emergency inhaler provides some relief but causes jitteriness. Anemia management delayed due to insurance. Chest x-ray needed due to history of mold and asbestos exposure and previous lung nodules. - Order chest x-ray to evaluate for other causes of shortness of breath. - Prescribe Advair inhaler, use twice daily, instruct to wash mouth after use. - Allow use of albuterol  inhaler as needed between Advair doses. - Advise to seek emergency care if shortness of breath worsens or if chest pain occurs. - Recent labs at the beginning of the month of August stable at hematology except for known anemia.  Anemia, under evaluation and management Anemia managed by hematologist with planned iron  infusion delayed due to insurance. Anemia may contribute to symptoms, but additional factors likely involved.  Suspected obstructive airway disease (asthma vs. COPD) Suspicion of obstructive airway disease due to asthma history and environmental exposure. Past use of Advair effective. Further evaluation needed to confirm diagnosis and guide treatment. - Refer to pulmonologist for further evaluation and lung function testing.  Anxiety disorder Anxiety may exacerbate shortness of breath. Albuterol  use can increase anxiety symptoms.     F/U: Patient follow-up to be determined based on x-ray.  JASON HESTLE WHITAKER, PA  This note has been created using automated tools and reviewed for accuracy by JASON HESTLE WHITAKER.    Note: This dictation was prepared with Dragon dictation along with smaller phrase technology. Any transcriptional errors that result from this process are unintentional.

## 2024-03-24 ENCOUNTER — Telehealth: Payer: Self-pay | Admitting: *Deleted

## 2024-03-24 NOTE — Telephone Encounter (Signed)
 Spoke with patient by phone.Kaylee Gordon She went to PCP on Friday and was diagnosed with pneumonia. She was given steroid and antibiotic. That definitely makes sense as the culprit for her dyspnea. Suggested she see our NP to see if we can facilitate iron  infusions. Labs and APP visit scheduled for tomorrow.

## 2024-03-25 ENCOUNTER — Inpatient Hospital Stay: Admitting: Nurse Practitioner

## 2024-03-25 ENCOUNTER — Inpatient Hospital Stay: Attending: Internal Medicine

## 2024-03-25 VITALS — BP 116/72 | HR 74 | Temp 99.2°F | Resp 24 | Wt 156.3 lb

## 2024-03-25 DIAGNOSIS — Z79899 Other long term (current) drug therapy: Secondary | ICD-10-CM | POA: Insufficient documentation

## 2024-03-25 DIAGNOSIS — Z7951 Long term (current) use of inhaled steroids: Secondary | ICD-10-CM | POA: Insufficient documentation

## 2024-03-25 DIAGNOSIS — I509 Heart failure, unspecified: Secondary | ICD-10-CM | POA: Diagnosis not present

## 2024-03-25 DIAGNOSIS — D649 Anemia, unspecified: Secondary | ICD-10-CM | POA: Diagnosis present

## 2024-03-25 DIAGNOSIS — Z79621 Long term (current) use of calcineurin inhibitor: Secondary | ICD-10-CM | POA: Diagnosis not present

## 2024-03-25 DIAGNOSIS — D508 Other iron deficiency anemias: Secondary | ICD-10-CM

## 2024-03-25 DIAGNOSIS — I110 Hypertensive heart disease with heart failure: Secondary | ICD-10-CM | POA: Diagnosis not present

## 2024-03-25 DIAGNOSIS — Z800 Family history of malignant neoplasm of digestive organs: Secondary | ICD-10-CM | POA: Insufficient documentation

## 2024-03-25 DIAGNOSIS — D509 Iron deficiency anemia, unspecified: Secondary | ICD-10-CM | POA: Insufficient documentation

## 2024-03-25 LAB — SAMPLE TO BLOOD BANK

## 2024-03-25 LAB — CBC WITH DIFFERENTIAL (CANCER CENTER ONLY)
Abs Immature Granulocytes: 0.03 K/uL (ref 0.00–0.07)
Basophils Absolute: 0.1 K/uL (ref 0.0–0.1)
Basophils Relative: 1 %
Eosinophils Absolute: 0.1 K/uL (ref 0.0–0.5)
Eosinophils Relative: 2 %
HCT: 36.1 % (ref 36.0–46.0)
Hemoglobin: 11.1 g/dL — ABNORMAL LOW (ref 12.0–15.0)
Immature Granulocytes: 1 %
Lymphocytes Relative: 30 %
Lymphs Abs: 1.9 K/uL (ref 0.7–4.0)
MCH: 25 pg — ABNORMAL LOW (ref 26.0–34.0)
MCHC: 30.7 g/dL (ref 30.0–36.0)
MCV: 81.3 fL (ref 80.0–100.0)
Monocytes Absolute: 0.7 K/uL (ref 0.1–1.0)
Monocytes Relative: 12 %
Neutro Abs: 3.5 K/uL (ref 1.7–7.7)
Neutrophils Relative %: 54 %
Platelet Count: 247 K/uL (ref 150–400)
RBC: 4.44 MIL/uL (ref 3.87–5.11)
RDW: 16 % — ABNORMAL HIGH (ref 11.5–15.5)
WBC Count: 6.3 K/uL (ref 4.0–10.5)
nRBC: 0 % (ref 0.0–0.2)

## 2024-03-25 LAB — IRON AND TIBC
Iron: 35 ug/dL (ref 28–170)
Saturation Ratios: 7 % — ABNORMAL LOW (ref 10.4–31.8)
TIBC: 501 ug/dL — ABNORMAL HIGH (ref 250–450)
UIBC: 466 ug/dL

## 2024-03-25 LAB — FERRITIN: Ferritin: 15 ng/mL (ref 11–307)

## 2024-03-25 MED ORDER — PRENATAL/IRON PO TABS: 1.0000 | ORAL_TABLET | Freq: Every day | ORAL | Status: DC

## 2024-03-25 NOTE — Progress Notes (Signed)
 [facility] CONSULT NOTE  Patient Care Team: Alla Amis, [name], [name]  CHIEF COMPLAINTS/PURPOSE OF CONSULTATION: ANEMIA  HEMATOLOGY HISTORY  # ANEMIA[Hb; MCV-platelets- WBC; Iron  sat; ferritin;  GFR- CT/US ; EGD/colonoscopy-  Blood in stools: intermittent- hx of hemorrhoids [2 times /month]; 2025- [facility]/EGD- [[facility]]  HISTORY OF PRESENTING ILLNESS:  Kaylee Gordon 63 y.o.  female with  pleasant patient with history of bipolar disorder, GAD, fibromyalgia who returns to clinic for follow up of her anemia. She could not take oral iron  d/t GI side effects. Venofer  was recommended. She took one dose and complains of a week of nausea and somnolence for week after. She did not require medical care fo rsymptoms but did take antiemetics that she had on hand. She was diagnosed with pneumonia in the interim and continues to recover. She complains of ongoing shortness of breath worse with exertion. Denies chest pain. Denies any neurologic complaints. Denies recent fevers or illnesses. Denies any easy bleeding or bruising. No melena or hematochezia. No pica or restless leg. Reports good appetite and denies weight loss. Denies chest pain. Denies any nausea, vomiting, constipation, or diarrhea. Denies urinary complaints. Patient offers no further specific complaints today.   Review of Systems  Constitutional:  Positive for malaise/fatigue. Negative for chills, diaphoresis, fever and weight loss.  HENT:  Negative for nosebleeds and sore throat.   Eyes:  Negative for double vision.  Respiratory:  Positive for shortness of breath. Negative for cough, hemoptysis, sputum production and wheezing.   Cardiovascular:  Negative for chest pain, palpitations, orthopnea and leg swelling.  Gastrointestinal:  Negative for abdominal pain, blood in stool, constipation, diarrhea, heartburn, melena, nausea and  vomiting.  Genitourinary:  Negative for dysuria, frequency, hematuria and urgency.  Musculoskeletal:  Positive for back pain and joint pain. Negative for falls.  Skin: Negative.  Negative for itching and rash.  Neurological:  Negative for dizziness, tingling, focal weakness, weakness and headaches.  Endo/Heme/Allergies:  Does not bruise/bleed easily.  Psychiatric/Behavioral:  Positive for depression. The patient is nervous/anxious and has insomnia.    MEDICAL HISTORY:  Past Medical History:  Diagnosis Date   Anxiety    Asthma    B12 deficiency    Cancer (HCC)    SKIN CANCER   CHF (congestive heart failure) (HCC)    Depression    Hypertension    Thyroid  disease    SURGICAL HISTORY: Past Surgical History:  Procedure Laterality Date   ANKLE SURGERY Right 2012   APPENDECTOMY     AUGMENTATION MAMMAPLASTY Bilateral    BREAST ENHANCEMENT SURGERY     COLONOSCOPY WITH PROPOFOL  N/A 02/22/2015   Procedure: COLONOSCOPY WITH PROPOFOL ;  Surgeon: Donnice Vaughn Manes, MD;  Location[facility] ENDOSCOPY;  Service: Endoscopy;  Laterality: N/A;   COLONOSCOPY WITH PROPOFOL  N/A 08/08/2022   Procedure: COLONOSCOPY WITH PROPOFOL ;  Surgeon: Maryruth Ole DASEN, MD;  Location: [facility] ENDOSCOPY;  Service: Endoscopy;  Laterality: N/A;   ESOPHAGOGASTRODUODENOSCOPY (EGD) WITH PROPOFOL  N/A 02/22/2015   Procedure: ESOPHAGOGASTRODUODENOSCOPY (EGD) WITH PROPOFOL ;  Surgeon: Donnice Vaughn Manes, MD;  Location: [facility] ENDOSCOPY;  Service: Endoscopy;  Laterality: N/A;   ESOPHAGOGASTRODUODENOSCOPY (EGD) WITH PROPOFOL  N/A 02/11/2016   Procedure: ESOPHAGOGASTRODUODENOSCOPY (EGD) WITH PROPOFOL ;  Surgeon: Lamar DASEN Holmes, MD;  Location: [facility] ENDOSCOPY;  Service: Endoscopy;  Laterality: N/A;   ESOPHAGOGASTRODUODENOSCOPY (EGD) WITH PROPOFOL  N/A 08/08/2022   Procedure: ESOPHAGOGASTRODUODENOSCOPY (EGD) WITH PROPOFOL ;  Surgeon: Maryruth Ole DASEN, MD;  Location:  [facility] ENDOSCOPY;  Service: Endoscopy;  Laterality: N/A;   HEMORRHOID SURGERY      NASAL SINUS SURGERY     SAVORY DILATION N/A 02/22/2015   Procedure: SAVORY DILATION;  Surgeon: Donnice Vaughn Manes, MD;  Location: [facility] ENDOSCOPY;  Service: Endoscopy;  Laterality: N/A;   SAVORY DILATION N/A 02/11/2016   Procedure: SAVORY DILATION;  Surgeon: Lamar ONEIDA Holmes, MD;  Location: [facility] ENDOSCOPY;  Service: Endoscopy;  Laterality: N/A;   SKIN CANCER EXCISION     SOCIAL HISTORY: Social History   Socioeconomic History   Marital status: Married    Spouse name: Ozell   Number of children: Not on file   Years of education: Not on file   Highest education level: Not on file  Occupational History   Not on file  Tobacco Use   Smoking status: Never   Smokeless tobacco: Never  Vaping Use   Vaping status: Never Used  Substance and Sexual Activity   Alcohol use: Not Currently   Drug use: No   Sexual activity: Not Currently    Partners: Male  Other Topics Concern   Not on file  Social History Narrative   Lives in [place]; taught high school- math; retd; no smoking; social alcohol.    Social Drivers of Corporate investment banker Strain: Low Risk  (08/07/2023)   Received from [facility]   Overall Financial Resource Strain (CARDIA)    Difficulty of Paying Living Expenses: Not hard at all  Food Insecurity: No Food Insecurity (02/25/2024)   Hunger Vital Sign    Worried About Running Out of Food in the Last Year: Never true    Ran Out of Food in the Last Year: Never true  Transportation Needs: No Transportation Needs (02/25/2024)   PRAPARE - Administrator, Civil Service (Medical): No    Lack of Transportation (Non-Medical): No  Physical Activity: Unknown (05/16/2018)   Exercise Vital Sign    Days of Exercise per Week: 0 days    Minutes of Exercise per Session: Not on file  Stress: Stress Concern Present (05/16/2018)   [employer] of Occupational Health - Occupational Stress Questionnaire    Feeling of Stress : Very much  Social  Connections: Unknown (05/16/2018)   Social Connection and Isolation Panel    Frequency of Communication with Friends and Family: Twice a week    Frequency of Social Gatherings with Friends and Family: Twice a week    Attends Religious Services: More than 4 times per year    Active Member of [employer] or Organizations: Not on file    Attends Banker Meetings: Not on file    Marital Status: Married  Intimate Partner Violence: Not At Risk (02/25/2024)   Humiliation, Afraid, Rape, and Kick questionnaire    Fear of Current or Ex-Partner: No    Emotionally Abused: No    Physically Abused: No    Sexually Abused: No   FAMILY HISTORY: Family History  Problem Relation Age of Onset   Cancer Mother        colon cancer   Depression Mother    Anxiety disorder Mother    Heart disease Father    Cancer Father        ? neck cancer- still living.    Anxiety disorder Father    Depression Father    Colon cancer Maternal Aunt        died in 43s.    Hypoparathyroidism Son    ALLERGIES:  is allergic to chlorzoxazone, levofloxacin, atorvastatin, hydrochlorothiazide, pollen extract, pseudoephedrine, and sudafed [pseudoephedrine hcl].  MEDICATIONS:  Current Outpatient Medications  Medication Sig Dispense Refill   Bromfenac Sodium 0.07 % SOLN STARTING 3 DAYS PRIOR TO SURGERY INSTILL 1 DROP INTO OPERATIVE EYE TWICE DAILY AS DIRECTED     busPIRone  (BUSPAR ) 10 MG tablet Take 4 tablets (40 mg total) by mouth as directed. TAKE 1 TABLET BY MOUTH 3 TIMES A DAY AND 1 EXTRA TABLET AS NEEDED FOR SEVERE ANXIETY 120 tablet 2   calcitRIOL  (ROCALTROL ) 0.25 MCG capsule Take 1 capsule (0.25 mcg total) by mouth daily. 90 capsule 3   cyanocobalamin (VITAMIN B12) 1000 MCG tablet Take 1,000 mcg by mouth daily.     doxycycline  (VIBRA -TABS) 100 MG tablet Take 100 mg by mouth.     escitalopram  (LEXAPRO ) 10 MG tablet Take 1.5 tablets (15 mg total) by mouth daily. 135 tablet 0   esomeprazole (NEXIUM) 40 MG capsule Take  1 capsule by mouth daily.     fenofibrate 160 MG tablet Take 1 tablet by mouth daily.     fluticasone (FLONASE) 50 MCG/ACT nasal spray      fluticasone-salmeterol (ADVAIR) 250-50 MCG/ACT AEPB Inhale 1 puff into the lungs.     gabapentin  (NEURONTIN ) 300 MG capsule Take 300 mg by mouth. 2 CAP AM, 1 CAP Q noon, 2 CAP pm,     isosorbide mononitrate (IMDUR) 30 MG 24 hr tablet      lamoTRIgine  (LAMICTAL ) 200 MG tablet Take 0.5 tablets (100 mg total) by mouth 2 (two) times daily. 90 tablet 1   levothyroxine  (SYNTHROID ) 75 MCG tablet Take 1 tablet (75 mcg total) by mouth daily. 90 tablet 3   lisinopril (ZESTRIL) 10 MG tablet Take 10 mg by mouth daily.     lovastatin (MEVACOR) 20 MG tablet Take 20 mg by mouth at bedtime.     ondansetron  (ZOFRAN ) 8 MG tablet Take 1 tablet (8 mg total) by mouth every 8 (eight) hours as needed for nausea or vomiting (nausea related to iron  infusions). 60 tablet 0   prazosin  (MINIPRESS ) 2 MG capsule Take 1 capsule (2 mg total) by mouth at bedtime. 90 capsule 1   prednisoLONE acetate (PRED FORTE) 1 % ophthalmic suspension      predniSONE  (DELTASONE ) 20 MG tablet Take 20 mg by mouth.     Prenatal Multivit-Min-Fe-FA (PRENATAL/IRON ) TABS Take 1 tablet by mouth daily at 12 noon.     propranolol ER (INDERAL LA) 80 MG 24 hr capsule Take 80 mg by mouth daily.     RESTASIS 0.05 % ophthalmic emulsion 1 drop 2 (two) times daily.     rOPINIRole  (REQUIP ) 0.25 MG tablet TAKE 1 TABLET BY MOUTH 3 TIMES A DAY 90 tablet 1   temazepam  (RESTORIL ) 30 MG capsule Take 1 capsule (30 mg total) by mouth at bedtime. 30 capsule 2   TRYPTYR 0.003 % SOLN Apply 1 drop to eye 2 (two) times daily.     ALREX 0.2 % SUSP Apply 1 drop to eye 3 (three) times daily. (Patient not taking: Reported on 03/25/2024)     Besifloxacin HCl (BESIVANCE) 0.6 % SUSP STARTING 3 DAYS PRIOR TO SURGERY INSTILL 1 DROP 3 TIMES PER DAY INTO OPERATIVE EYE AS DIRECTED (Patient not taking: Reported on 03/25/2024)     bimatoprost (LUMIGAN)  0.03 % ophthalmic solution SMARTSIG:In Eye(s) (Patient not taking: Reported on 03/25/2024)     cycloSPORINE (VEVYE) 0.1 % SOLN Instill ONE drop IN EACH EYE TWICE DAILY (Patient  not taking: Reported on 03/25/2024)     Daridorexant  HCl (QUVIVIQ ) 50 MG TABS      Difluprednate 0.05 % EMUL INSTILL 1 DROP INTO OPERATIVE EYE THREE TIMES DAILY STARTING 3 DAYS PRIOR TO SURGERY AND CONTINUE FOR 1 WEEK AFTER SURGERY, THEN 2 TIMES A DAY FOR 1 WEEK, THEN 1 TIME A DAY FOR 1 WEEK. THEN STOP     ipratropium (ATROVENT ) 0.03 % nasal spray      No current facility-administered medications for this visit.   PHYSICAL EXAMINATION: Vitals:   03/25/24 1116  BP: 116/72  Pulse: 74  Resp: (!) 24  Temp: 99.2 F (37.3 C)   Filed Weights   03/25/24 1116  Weight: 156 lb 4.8 oz (70.9 kg)   Physical Exam Vitals reviewed.  Constitutional:      Appearance: She is not ill-appearing.  HENT:     Head: Normocephalic and atraumatic.  Cardiovascular:     Rate and Rhythm: Normal rate and regular rhythm.  Pulmonary:     Effort: No respiratory distress.  Abdominal:     General: There is no distension.     Palpations: Abdomen is soft.  Skin:    General: Skin is warm.     Coloration: Skin is not pale.  Neurological:     Mental Status: She is alert and oriented to person, place, and time.  Psychiatric:        Mood and Affect: Mood normal.        Behavior: Behavior normal.     LABORATORY DATA:  I have reviewed the data as listed Lab Results  Component Value Date   WBC 6.3 03/25/2024   HGB 11.1 (L) 03/25/2024   HCT 36.1 03/25/2024   MCV 81.3 03/25/2024   PLT 247 03/25/2024   Iron /TIBC/Ferritin/ %Sat    Component Value Date/Time   IRON  35 03/25/2024 1058   TIBC 501 (H) 03/25/2024 1058   FERRITIN 15 03/25/2024 1058   IRONPCTSAT 7 (L) 03/25/2024 1058    No results found.  ASSESSMENT & PLAN:   Anemia- hemoglobin 10. Platelets WNL. July 2025- PCP. GFR 54. Calcium normal. Etiology- thought to be benign  etiology- low concern for any malignant processes. Hold off on bone marrow biopsy. 2025- Colonoscopy/EGD ([facility] botox-[facility]).  She suffered side effects to Venofer -nausea, diarrhea, malaise x 1 week. Did not require medical intervention. Atypical side effect profile for venofer  and duration. Patient declines retrial.  Allegra has been declined by insurance. Currently undergoing appeal of denial of monoferric. Discussed with Dr Rennie - Given her history of allergic reactions and previous intolerance to venofer  we feel that Infed which is preferred by insurance is too high risk and not on formulary at our institution. Her hemoglobin has improved, now 11.1. Ferritin improved to 15, iron  sat improved but decreased at 7%. Recommend trial of oral prenatal vitamin with iron  which is usually well tolerated.  Fatigue-question related to anemia versus comorbidities.  Posterior chest wall pain-chronic intermittent for the last 6 months.  Low clinical concern for PE.  Suspect musculoskeletal versus fibromyalgia-declined x-ray.  She does not complain o fthis today. Recommend f/u with pcp if recurrent symptoms.  History of bipolar disorder/anxiety depression. Managed by Dr Coby.   Disposition:  2 mo- lab (cbc, ferritin, iron  studies) Day later- see Dr Rennie- la   Thank you Dr. Alla MD for allowing me to participate in the care of your pleasant patient. Please do not hesitate to contact me with questions or concerns in the interim.  No problem-specific Assessment & Plan notes found for this encounter.  Tinnie KANDICE Dawn, NP 03/25/2024

## 2024-03-27 ENCOUNTER — Other Ambulatory Visit: Payer: Self-pay | Admitting: Emergency Medicine

## 2024-03-27 ENCOUNTER — Other Ambulatory Visit
Admission: RE | Admit: 2024-03-27 | Discharge: 2024-03-27 | Disposition: A | Discharge status: Home / Self Care | Source: Ambulatory Visit | Attending: Emergency Medicine | Admitting: Emergency Medicine

## 2024-03-27 DIAGNOSIS — J4541 Moderate persistent asthma with (acute) exacerbation: Secondary | ICD-10-CM | POA: Insufficient documentation

## 2024-03-27 DIAGNOSIS — R0602 Shortness of breath: Secondary | ICD-10-CM | POA: Diagnosis present

## 2024-03-27 LAB — D-DIMER, QUANTITATIVE: D-Dimer, Quant: 0.59 ug{FEU}/mL — ABNORMAL HIGH (ref 0.00–0.50)

## 2024-03-28 ENCOUNTER — Other Ambulatory Visit: Payer: Self-pay | Admitting: Emergency Medicine

## 2024-03-28 ENCOUNTER — Ambulatory Visit
Admission: RE | Admit: 2024-03-28 | Discharge: 2024-03-28 | Disposition: A | Discharge status: Home / Self Care | Source: Ambulatory Visit | Attending: Emergency Medicine | Admitting: Emergency Medicine

## 2024-03-28 DIAGNOSIS — R0602 Shortness of breath: Secondary | ICD-10-CM

## 2024-03-28 MED ORDER — IOHEXOL 350 MG/ML SOLN
75.0000 mL | Freq: Once | INTRAVENOUS | Status: AC | PRN
  Administered 2024-03-28: 75 mL via INTRAVENOUS

## 2024-04-02 ENCOUNTER — Other Ambulatory Visit: Payer: Self-pay | Admitting: Pulmonary Disease

## 2024-04-05 ENCOUNTER — Other Ambulatory Visit: Payer: Self-pay | Admitting: Psychiatry

## 2024-04-05 DIAGNOSIS — G4701 Insomnia due to medical condition: Secondary | ICD-10-CM

## 2024-04-05 DIAGNOSIS — G2581 Restless legs syndrome: Secondary | ICD-10-CM

## 2024-04-07 ENCOUNTER — Ambulatory Visit: Payer: Medicare PPO | Admitting: Psychology

## 2024-04-07 ENCOUNTER — Encounter
Admission: RE | Admit: 2024-04-07 | Discharge: 2024-04-07 | Disposition: A | Discharge status: Home / Self Care | Source: Ambulatory Visit | Attending: Pulmonary Disease | Admitting: Pulmonary Disease

## 2024-04-07 HISTORY — DX: Personal history of other diseases of the digestive system: Z87.19

## 2024-04-07 HISTORY — DX: Abnormal electrocardiogram (ECG) (EKG): R94.31

## 2024-04-07 HISTORY — DX: Dyspnea, unspecified: R06.00

## 2024-04-07 HISTORY — DX: Polyp of colon: K63.5

## 2024-04-07 HISTORY — DX: Other specified postprocedural states: Z98.890

## 2024-04-07 HISTORY — DX: Iron deficiency anemia, unspecified: D50.9

## 2024-04-07 HISTORY — DX: Fibromyalgia: M79.7

## 2024-04-07 HISTORY — DX: Other nonspecific abnormal finding of lung field: R91.8

## 2024-04-07 HISTORY — DX: Localized enlarged lymph nodes: R59.0

## 2024-04-07 HISTORY — DX: Bipolar disorder, unspecified: F31.9

## 2024-04-07 HISTORY — DX: Nausea with vomiting, unspecified: R11.2

## 2024-04-07 HISTORY — DX: Prediabetes: R73.03

## 2024-04-07 HISTORY — DX: Personal history of malignant melanoma of skin: Z85.820

## 2024-04-07 HISTORY — DX: Hypoparathyroidism, unspecified: E20.9

## 2024-04-07 HISTORY — DX: Hypothyroidism, unspecified: E03.9

## 2024-04-07 HISTORY — DX: Family history of other specified conditions: Z84.89

## 2024-04-07 NOTE — Patient Instructions (Signed)
 Your procedure is scheduled on:04-11-24 Friday Report to the Registration Desk on the 1st floor of the Medical Mall.Then proceed to the 2nd floor Surgery Desk To find out your arrival time, please call [phone] between 1PM - 3PM on:04-10-24 Thursday If your arrival time is 6:00 am, do not arrive before that time as the Medical Mall entrance doors do not open until 6:00 am.  REMEMBER: Instructions that are not followed completely may result in serious medical risk, up to and including death; or upon the discretion of your surgeon and anesthesiologist your surgery may need to be rescheduled.  Do not eat food OR drink liquids after midnight the night before surgery.  No gum chewing or hard candies.  One week prior to surgery:Stop NOW (04-07-24) Stop Anti-inflammatories (NSAIDS) such as Advil, Aleve, Ibuprofen, Motrin, Naproxen, Naprosyn and Aspirin based products such as Excedrin, Goody's Powder, BC Powder. Stop ANY OVER THE COUNTER supplements until after surgery (Vitamin B12)  You may however, continue to take Tylenol  if needed for pain up until the day of surgery.  Continue taking all of your other prescription medications up until the day of surgery.  ON THE DAY OF SURGERY ONLY TAKE THESE MEDICATIONS WITH SIPS OF WATER: -busPIRone  (BUSPAR )  -escitalopram  (LEXAPRO )  -esomeprazole (NEXIUM)  -gabapentin  (NEURONTIN )  -isosorbide mononitrate (IMDUR)  -lamoTRIgine  (LAMICTAL )  -levothyroxine  (SYNTHROID )  -propranolol ER (INDERAL LA)  -rOPINIRole  (REQUIP )   Use your Advair Inhaler the morning of surgery  No Alcohol for 24 hours before or after surgery.  No Smoking including e-cigarettes for 24 hours before surgery.  No chewable tobacco products for at least 6 hours before surgery.  No nicotine patches on the day of surgery.  Do not use any recreational drugs for at least a week (preferably 2 weeks) before your surgery.  Please be advised that the combination of cocaine and  anesthesia may have negative outcomes, up to and including death. If you test positive for cocaine, your surgery will be cancelled.  On the morning of surgery brush your teeth with toothpaste and water, you may rinse your mouth with mouthwash if you wish. Do not swallow any toothpaste or mouthwash.  Do not wear jewelry, make-up, hairpins, clips or nail polish.  For welded (permanent) jewelry: bracelets, anklets, waist bands, etc.  Please have this removed prior to surgery.  If it is not removed, there is a chance that hospital personnel will need to cut it off on the day of surgery.  Do not wear lotions, powders, or perfumes.   Do not shave body hair from the neck down 48 hours before surgery.  Contact lenses, hearing aids and dentures may not be worn into surgery.  Do not bring valuables to the hospital. [facility] is not responsible for any missing/lost belongings or valuables.   Notify your doctor if there is any change in your medical condition (cold, fever, infection).  Wear comfortable clothing (specific to your surgery type) to the hospital.  After surgery, you can help prevent lung complications by doing breathing exercises.  Take deep breaths and cough every 1-2 hours. Your doctor may order a device called an Incentive Spirometer to help you take deep breaths. When coughing or sneezing, hold a pillow firmly against your incision with both hands. This is called "splinting." Doing this helps protect your incision. It also decreases belly discomfort.  If you are being admitted to the hospital overnight, leave your suitcase in the car. After surgery it may be brought to your room.  In case of increased patient census, it may be necessary for you, the patient, to continue your postoperative care in the Same Day Surgery department.  If you are being discharged the day of surgery, you will not be allowed to drive home. You will need a responsible individual to drive you home and  stay with you for 24 hours after surgery.   If you are taking public transportation, you will need to have a responsible individual with you.  Please call the Pre-admissions Testing Dept. at [phone] if you have any questions about these instructions.  Surgery Visitation Policy:  Patients having surgery or a procedure may have two visitors.  Children under the age of 9 must have an adult with them who is not the patient.   Merchandiser, retail to address health-related social needs:  [email or web address]

## 2024-04-08 ENCOUNTER — Other Ambulatory Visit: Payer: Self-pay | Admitting: Pulmonary Disease

## 2024-04-08 DIAGNOSIS — Z85820 Personal history of malignant melanoma of skin: Secondary | ICD-10-CM

## 2024-04-08 DIAGNOSIS — R590 Localized enlarged lymph nodes: Secondary | ICD-10-CM

## 2024-04-08 DIAGNOSIS — R918 Other nonspecific abnormal finding of lung field: Secondary | ICD-10-CM

## 2024-04-09 ENCOUNTER — Ambulatory Visit
Admission: RE | Admit: 2024-04-09 | Discharge: 2024-04-09 | Disposition: A | Discharge status: Home / Self Care | Source: Ambulatory Visit | Attending: Pulmonary Disease | Admitting: Pulmonary Disease

## 2024-04-09 DIAGNOSIS — R590 Localized enlarged lymph nodes: Secondary | ICD-10-CM | POA: Diagnosis present

## 2024-04-09 DIAGNOSIS — R918 Other nonspecific abnormal finding of lung field: Secondary | ICD-10-CM | POA: Insufficient documentation

## 2024-04-09 DIAGNOSIS — Z85820 Personal history of malignant melanoma of skin: Secondary | ICD-10-CM | POA: Insufficient documentation

## 2024-04-09 MED ORDER — PROPOFOL 1000 MG/100ML IV EMUL
INTRAVENOUS | Status: AC
Start: 2024-04-09 — End: 2024-04-09
  Filled 2024-04-09: qty 100

## 2024-04-09 MED ORDER — PROPOFOL 1000 MG/100ML IV EMUL
INTRAVENOUS | Status: AC
  Filled 2024-04-09: qty 300

## 2024-04-09 MED ORDER — PROPOFOL 10 MG/ML IV BOLUS
INTRAVENOUS | Status: AC
  Filled 2024-04-09: qty 20

## 2024-04-09 MED ORDER — EPHEDRINE 5 MG/ML INJ
INTRAVENOUS | Status: AC
Start: 2024-04-09 — End: 2024-04-09
  Filled 2024-04-09: qty 5

## 2024-04-09 MED ORDER — METOCLOPRAMIDE HCL 5 MG/ML IJ SOLN
INTRAMUSCULAR | Status: AC
  Filled 2024-04-09: qty 2

## 2024-04-10 MED ORDER — ORAL CARE MOUTH RINSE
15.0000 mL | Freq: Once | OROMUCOSAL | Status: AC
Start: 2024-04-10 — End: 2024-04-11

## 2024-04-10 MED ORDER — LACTATED RINGERS IV SOLN: INTRAVENOUS | Status: DC

## 2024-04-10 MED ORDER — CHLORHEXIDINE GLUCONATE 0.12 % MT SOLN
15.0000 mL | Freq: Once | OROMUCOSAL | Status: AC
Start: 2024-04-10 — End: 2024-04-11
  Administered 2024-04-11: 15 mL via OROMUCOSAL

## 2024-04-11 ENCOUNTER — Ambulatory Visit

## 2024-04-11 ENCOUNTER — Ambulatory Visit
Admission: RE | Admit: 2024-04-11 | Discharge: 2024-04-11 | Disposition: A | Discharge status: Home / Self Care | Attending: Pulmonary Disease | Admitting: Pulmonary Disease

## 2024-04-11 ENCOUNTER — Ambulatory Visit: Admitting: Anesthesiology

## 2024-04-11 ENCOUNTER — Encounter
Admission: RE | Disposition: A | Discharge status: Home / Self Care | Payer: Self-pay | Source: Home / Self Care | Attending: Pulmonary Disease

## 2024-04-11 ENCOUNTER — Other Ambulatory Visit: Payer: Self-pay

## 2024-04-11 ENCOUNTER — Encounter: Payer: Self-pay | Admitting: Pulmonary Disease

## 2024-04-11 DIAGNOSIS — M797 Fibromyalgia: Secondary | ICD-10-CM | POA: Diagnosis not present

## 2024-04-11 DIAGNOSIS — Z85820 Personal history of malignant melanoma of skin: Secondary | ICD-10-CM | POA: Insufficient documentation

## 2024-04-11 DIAGNOSIS — N183 Chronic kidney disease, stage 3 unspecified: Secondary | ICD-10-CM | POA: Diagnosis not present

## 2024-04-11 DIAGNOSIS — M1990 Unspecified osteoarthritis, unspecified site: Secondary | ICD-10-CM | POA: Insufficient documentation

## 2024-04-11 DIAGNOSIS — E209 Hypoparathyroidism, unspecified: Secondary | ICD-10-CM | POA: Diagnosis not present

## 2024-04-11 DIAGNOSIS — F319 Bipolar disorder, unspecified: Secondary | ICD-10-CM | POA: Diagnosis not present

## 2024-04-11 DIAGNOSIS — I130 Hypertensive heart and chronic kidney disease with heart failure and stage 1 through stage 4 chronic kidney disease, or unspecified chronic kidney disease: Secondary | ICD-10-CM | POA: Diagnosis not present

## 2024-04-11 DIAGNOSIS — I509 Heart failure, unspecified: Secondary | ICD-10-CM | POA: Insufficient documentation

## 2024-04-11 DIAGNOSIS — J45909 Unspecified asthma, uncomplicated: Secondary | ICD-10-CM | POA: Insufficient documentation

## 2024-04-11 DIAGNOSIS — R7303 Prediabetes: Secondary | ICD-10-CM | POA: Diagnosis not present

## 2024-04-11 DIAGNOSIS — C779 Secondary and unspecified malignant neoplasm of lymph node, unspecified: Secondary | ICD-10-CM | POA: Diagnosis not present

## 2024-04-11 DIAGNOSIS — C3431 Malignant neoplasm of lower lobe, right bronchus or lung: Secondary | ICD-10-CM | POA: Diagnosis not present

## 2024-04-11 DIAGNOSIS — R918 Other nonspecific abnormal finding of lung field: Secondary | ICD-10-CM | POA: Diagnosis present

## 2024-04-11 DIAGNOSIS — R590 Localized enlarged lymph nodes: Secondary | ICD-10-CM | POA: Diagnosis present

## 2024-04-11 HISTORY — PX: VIDEO BRONCHOSCOPY WITH ENDOBRONCHIAL ULTRASOUND: SHX6177

## 2024-04-11 HISTORY — PX: VIDEO BRONCHOSCOPY WITH ENDOBRONCHIAL NAVIGATION: SHX6175

## 2024-04-11 LAB — BODY FLUID CELL COUNT WITH DIFFERENTIAL: Total Nucleated Cell Count, Fluid: 5 uL

## 2024-04-11 SURGERY — VIDEO BRONCHOSCOPY WITH ENDOBRONCHIAL NAVIGATION
Anesthesia: General

## 2024-04-11 MED ORDER — FENTANYL CITRATE (PF) 100 MCG/2ML IJ SOLN
INTRAMUSCULAR | Status: DC | PRN
  Administered 2024-04-11 (×2): 50 ug via INTRAVENOUS

## 2024-04-11 MED ORDER — ACETAMINOPHEN 10 MG/ML IV SOLN: 1000.0000 mg | Freq: Once | INTRAVENOUS | Status: DC | PRN

## 2024-04-11 MED ORDER — MIDAZOLAM HCL 2 MG/2ML IJ SOLN
INTRAMUSCULAR | Status: DC | PRN
  Administered 2024-04-11: 2 mg via INTRAVENOUS

## 2024-04-11 MED ORDER — OXYCODONE HCL 5 MG PO TABS: 5.0000 mg | ORAL_TABLET | Freq: Once | ORAL | Status: DC | PRN

## 2024-04-11 MED ORDER — SUGAMMADEX SODIUM 200 MG/2ML IV SOLN
INTRAVENOUS | Status: DC | PRN
  Administered 2024-04-11: 200 mg via INTRAVENOUS

## 2024-04-11 MED ORDER — LIDOCAINE HCL (PF) 2 % IJ SOLN
INTRAMUSCULAR | Status: AC
  Filled 2024-04-11: qty 5

## 2024-04-11 MED ORDER — DEXAMETHASONE SODIUM PHOSPHATE 10 MG/ML IJ SOLN
INTRAMUSCULAR | Status: AC
  Filled 2024-04-11: qty 1

## 2024-04-11 MED ORDER — CHLORHEXIDINE GLUCONATE 0.12 % MT SOLN
OROMUCOSAL | Status: AC
Start: 2024-04-11 — End: 2024-04-11
  Filled 2024-04-11: qty 15

## 2024-04-11 MED ORDER — PHENYLEPHRINE 80 MCG/ML (10ML) SYRINGE FOR IV PUSH (FOR BLOOD PRESSURE SUPPORT)
PREFILLED_SYRINGE | INTRAVENOUS | Status: AC
  Filled 2024-04-11: qty 10

## 2024-04-11 MED ORDER — EPHEDRINE SULFATE-NACL 50-0.9 MG/10ML-% IV SOSY
PREFILLED_SYRINGE | INTRAVENOUS | Status: DC | PRN
  Administered 2024-04-11 (×5): 5 mg via INTRAVENOUS

## 2024-04-11 MED ORDER — IPRATROPIUM-ALBUTEROL 0.5-2.5 (3) MG/3ML IN SOLN
3.0000 mL | Freq: Once | RESPIRATORY_TRACT | Status: AC
  Administered 2024-04-11: 3 mL via RESPIRATORY_TRACT

## 2024-04-11 MED ORDER — EPHEDRINE 5 MG/ML INJ
INTRAVENOUS | Status: AC
  Filled 2024-04-11: qty 5

## 2024-04-11 MED ORDER — FENTANYL CITRATE (PF) 100 MCG/2ML IJ SOLN: 25.0000 ug | INTRAMUSCULAR | Status: DC | PRN

## 2024-04-11 MED ORDER — OXYCODONE HCL 5 MG/5ML PO SOLN: 5.0000 mg | Freq: Once | ORAL | Status: DC | PRN

## 2024-04-11 MED ORDER — DEXAMETHASONE SODIUM PHOSPHATE 10 MG/ML IJ SOLN
INTRAMUSCULAR | Status: DC | PRN
Start: 2024-04-11 — End: 2024-04-11
  Administered 2024-04-11: 10 mg via INTRAVENOUS

## 2024-04-11 MED ORDER — LIDOCAINE HCL (CARDIAC) PF 100 MG/5ML IV SOSY
PREFILLED_SYRINGE | INTRAVENOUS | Status: DC | PRN
  Administered 2024-04-11: 40 mg via INTRAVENOUS
  Administered 2024-04-11: 60 mg via INTRAVENOUS

## 2024-04-11 MED ORDER — MIDAZOLAM HCL 2 MG/2ML IJ SOLN
INTRAMUSCULAR | Status: AC
  Filled 2024-04-11: qty 2

## 2024-04-11 MED ORDER — IPRATROPIUM-ALBUTEROL 0.5-2.5 (3) MG/3ML IN SOLN
RESPIRATORY_TRACT | Status: AC
Start: 2024-04-11 — End: 2024-04-11
  Filled 2024-04-11: qty 3

## 2024-04-11 MED ORDER — PROPOFOL 10 MG/ML IV BOLUS
INTRAVENOUS | Status: DC | PRN
  Administered 2024-04-11: 40 mg via INTRAVENOUS
  Administered 2024-04-11: 160 mg via INTRAVENOUS

## 2024-04-11 MED ORDER — SCOPOLAMINE 1 MG/3DAYS TD PT72
1.0000 | MEDICATED_PATCH | TRANSDERMAL | Status: DC
  Administered 2024-04-11: 1 mg via TRANSDERMAL

## 2024-04-11 MED ORDER — PHENYLEPHRINE 80 MCG/ML (10ML) SYRINGE FOR IV PUSH (FOR BLOOD PRESSURE SUPPORT)
PREFILLED_SYRINGE | INTRAVENOUS | Status: DC | PRN
  Administered 2024-04-11: 80 ug via INTRAVENOUS
  Administered 2024-04-11 (×3): 160 ug via INTRAVENOUS
  Administered 2024-04-11: 80 ug via INTRAVENOUS
  Administered 2024-04-11: 160 ug via INTRAVENOUS

## 2024-04-11 MED ORDER — GLYCOPYRROLATE 0.2 MG/ML IJ SOLN
INTRAMUSCULAR | Status: DC | PRN
  Administered 2024-04-11: .2 mg via INTRAVENOUS

## 2024-04-11 MED ORDER — FENTANYL CITRATE (PF) 100 MCG/2ML IJ SOLN
INTRAMUSCULAR | Status: AC
  Filled 2024-04-11: qty 2

## 2024-04-11 MED ORDER — ROCURONIUM BROMIDE 100 MG/10ML IV SOLN
INTRAVENOUS | Status: DC | PRN
  Administered 2024-04-11: 60 mg via INTRAVENOUS

## 2024-04-11 MED ORDER — LACTATED RINGERS IV SOLN: INTRAVENOUS | Status: DC

## 2024-04-11 MED ORDER — GLYCOPYRROLATE 0.2 MG/ML IJ SOLN
INTRAMUSCULAR | Status: AC
Start: 2024-04-11 — End: 2024-04-11
  Filled 2024-04-11: qty 1

## 2024-04-11 MED ORDER — ROCURONIUM BROMIDE 10 MG/ML (PF) SYRINGE
PREFILLED_SYRINGE | INTRAVENOUS | Status: AC
Start: 2024-04-11 — End: 2024-04-11
  Filled 2024-04-11: qty 10

## 2024-04-11 MED ORDER — ONDANSETRON HCL 4 MG/2ML IJ SOLN
INTRAMUSCULAR | Status: DC | PRN
  Administered 2024-04-11: 4 mg via INTRAVENOUS

## 2024-04-11 MED ORDER — SCOPOLAMINE 1 MG/3DAYS TD PT72
MEDICATED_PATCH | TRANSDERMAL | Status: AC
  Filled 2024-04-11: qty 1

## 2024-04-11 NOTE — Anesthesia Postprocedure Evaluation (Signed)
 Anesthesia Post Note  Patient: Elisse M Schorsch  Procedure(s) Performed: VIDEO BRONCHOSCOPY WITH ENDOBRONCHIAL NAVIGATION BRONCHOSCOPY, WITH EBUS  Patient location during evaluation: PACU Anesthesia Type: General Level of consciousness: awake and alert, oriented and patient cooperative Pain management: pain level controlled Vital Signs Assessment: post-procedure vital signs reviewed and stable Respiratory status: spontaneous breathing, nonlabored ventilation and respiratory function stable Cardiovascular status: blood pressure returned to baseline and stable Postop Assessment: adequate PO intake Anesthetic complications: no   No notable events documented.   Last Vitals:  Vitals:   04/11/24 1015 04/11/24 1030  BP: 100/62   Pulse: 86   Resp: (!) 21   Temp:    SpO2: 93% 92%    Last Pain:  Vitals:   04/11/24 1015  TempSrc:   PainSc: 0-No pain                 Alfonso Ruths

## 2024-04-11 NOTE — Transfer of Care (Signed)
 Immediate Anesthesia Transfer of Care Note  Patient: Kaylee Gordon  Procedure(s) Performed: VIDEO BRONCHOSCOPY WITH ENDOBRONCHIAL NAVIGATION BRONCHOSCOPY, WITH EBUS  Patient Location: PACU  Anesthesia Type:General  Level of Consciousness: drowsy  Airway & Oxygen Therapy: Patient Spontanous Breathing and Patient connected to face mask oxygen  Post-op Assessment: Report given to RN and Post -op Vital signs reviewed and stable  Post vital signs: stable  Last Vitals:  Vitals Value Taken Time  BP 96/57 04/11/24 09:19  Temp    Pulse 87 04/11/24 09:21  Resp 23 04/11/24 09:21  SpO2 93 % 04/11/24 09:21  Vitals shown include unfiled device data.  Last Pain:  Vitals:   04/11/24 0650  TempSrc: Temporal  PainSc: 0-No pain      Patients Stated Pain Goal: 0 (04/11/24 0650)  Complications: No notable events documented.

## 2024-04-11 NOTE — Anesthesia Preprocedure Evaluation (Addendum)
 Anesthesia Evaluation  Patient identified by MRN, date of birth, ID band Patient awake    Reviewed: Allergy & Precautions, NPO status , Patient's Chart, lab work & pertinent test results  History of Anesthesia Complications (+) PONV, PROLONGED EMERGENCE and history of anesthetic complications  Airway Mallampati: IV   Neck ROM: Full    Dental  (+) Caps, Implants   Pulmonary asthma    Pulmonary exam normal breath sounds clear to auscultation       Cardiovascular hypertension, +CHF  Normal cardiovascular exam Rhythm:Regular Rate:Normal  ECG 11/16/23: NSR; nonspecific ST and T abnormality   Neuro/Psych  PSYCHIATRIC DISORDERS Anxiety Depression Bipolar Disorder   negative neurological ROS     GI/Hepatic   Endo/Other  Hypothyroidism  Prediabetes   Renal/GU Renal disease (stage III CKD)     Musculoskeletal  (+) Arthritis ,  Fibromyalgia -  Abdominal   Peds  Hematology  (+) Blood dyscrasia, anemia   Anesthesia Other Findings   Reproductive/Obstetrics                              Anesthesia Physical Anesthesia Plan  ASA: 2  Anesthesia Plan: General   Post-op Pain Management:    Induction: Intravenous  PONV Risk Score and Plan: 4 or greater and Ondansetron , Dexamethasone , Treatment may vary due to age or medical condition and Scopolamine  patch - Pre-op  Airway Management Planned: Oral ETT  Additional Equipment:   Intra-op Plan:   Post-operative Plan: Extubation in OR  Informed Consent: I have reviewed the patients History and Physical, chart, labs and discussed the procedure including the risks, benefits and alternatives for the proposed anesthesia with the patient or authorized representative who has indicated his/her understanding and acceptance.     Dental advisory given  Plan Discussed with: CRNA  Anesthesia Plan Comments: (Patient consented for risks of anesthesia including  but not limited to:  - adverse reactions to medications - damage to eyes, teeth, lips or other oral mucosa - nerve damage due to positioning  - sore throat or hoarseness - damage to heart, brain, nerves, lungs, other parts of body or loss of life  Informed patient about role of CRNA in peri- and intra-operative care.  Patient voiced understanding.)         Anesthesia Quick Evaluation

## 2024-04-11 NOTE — Progress Notes (Signed)
 After getting dressed/ getting off of stretcher patient became SOB on exertion, 02 in 80s on room air. Called Dr. Mazzoni for duoneb, patient states she gets winded at home on exertion. Lungs clear on auscultation in pacu. O2 Improving with duoneb. Incentive spirometer instructed and given to patient.

## 2024-04-11 NOTE — Procedures (Signed)
 ROBOTIC NAVIGATIONAL BRONCHOSCOPY PROCEDURE NOTE  FIBEROPTIC BRONCHOSCOPY WITH BRONCHOALVEOLAR LAVAGE PROCEDURE NOTE  THERAPEUTIC ASPIRATION OF TRACHEOBRONCHIAL TREE  ENDOBRONCHIAL ULTRASOUND X >/1 LYMPH NODE PROCEDURE NOTE  TRANSBRONCHIAL FNA X 1 LOBE  TRANSBRONCHIAL SURGICAL LUNG BIOPSY X 1 LOBE  RADIAL ENDOBRONCHIAL ULTRASOUND  TRANSBRONCHIAL CYTOPATHOLOGY BRUSHINGS    Flexible bronchoscopy was performed  by : Parris MD  assistance by : 1)Repiratory therapist  and 2)cytotech staff and 3) Anesthesia team and 4) Flouroscopy team and 5) IT supporting staff for robotic platform   Indication for the procedure was :  Pre-procedural H&P. The following assessment was performed on the day of the procedure prior to initiating sedation History:  Chest pain n Dyspnea y Hemoptysis n Cough y Fever n Other pertinent items n  Examination Vital signs -reviewed as per nursing documentation today Cardiac    Murmurs: n  Rubs : n  Gallop: n Lungs Wheezing: n Rales : n Rhonchi :y  Other pertinent findings: SOB/hypoxemia due to chronic lung disease   Pre-procedural assessment for Procedural Sedation included: Depth of sedation: As per anesthesia team  ASA Classification:  2 Mallampati airway assessment: 3    Medication list reviewed: y  The patient's interval history was taken and revealed: no new complaints The pre- procedure physical examination revealed: No new findings Refer to prior clinic note for details.  Informed Consent: Informed consent was obtained from:  patient after explanation of procedure and risks, benefits, as well as alternative procedures available.  Explanation of level of sedation and possible transfusion was also provided.    Procedural Preparation: Time out was performed and patient was identified by name and birthdate and procedure to be performed and side for sampling, if any, was specified. Pt was intubated by anesthesia.  The patient was  appropriately draped.   Fiberoptic bronchoscopy with airway inspection, therapeutic aspiration of tracheobronchial tree and BAL Procedure findings:  Bronchoscope was inserted via ETT  without difficulty.  Posterior oropharynx, epiglottis, arytenoids, false cords and vocal cords were not visualized as these were bypassed by endotracheal tube. The distal trachea was normal in circumference and appearance without mucosal, cartilaginous or branching abnormalities.  The main carina was mildly splayed . All right and left lobar airways were NOT visualized to the Subsegmental level due to obstructed airways from phlegm and mucus  plugging. Mucus plugging with partial to complete airway obstruction was noted bilaterally and treated with therapeutic aspiration prior to beginning of robotic bronchoscopy to allow adequate visualization and accurate lung biopsy and improve patients respiratory status.    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  Imaging was performed utilizing improvada.    Mucus plugging was aspirated and evacuated bilaterally  BAL was done at [facility] with cellular return  BAL was submitted for cell count and differential, cytology, microbiology  The mucosa was : friable at target segment  Airways were notable for:        exophytic lesions :n       extrinsic compression in the following distributions: n.       Friable mucosa: y       Teacher, music /pigmentation: n     Post procedure Diagnosis:   Mucus plugging of tracheobronchial tree with numerous well rounded subcentimeter tumorlets     Navigational Bronchoscopy Procedure Findings:   Ion robotic platform utilized for this procedure. Post appropriate planning and registration peripheral navigation was used to visualize target lesion.   Radial endobronchial US  technology utilized during this procedure  for confirmation of lesion.  3D Fluoroscopy utilized for tool in lesion  confirmation    Target 1 Right lower lobe posterior nodule - Transbronchial cytobrushing x 1  ,   Transbronchial FNA x 4,   Transbronchial surgical pathology x 3,    BAL was performed at this location and sent for processing with cytology and microbiology  Post procedure diagnosis:  nodule of right lower lobe       Endobronchial ultrasound assisted hilar and mediastinal lymph node biopsies procedure findings: The fiberoptic bronchoscope was removed and the EBUS scope was introduced. Examination began to evaluate for pathologically enlarged lymph nodes starting on the left side progressing to the right side.  All lymph node biopsies performed with 21g needle. Lymph node biopsies were sent in cytolite for all stations.  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   Total dosage of Lidocaine  was 1 mg  Estimated Blood loss: EXPECTED < 5cc.  Complications included:  NONE   Preliminary CXR findings :  IN PROCESS  Disposition: HOME WITH FAMILY   Follow up with Dr. Taquanna Borras in 5 days for result discussion.     Halina Picking MD  [facility] Division of Pulmonary & Critical Care Medicine

## 2024-04-11 NOTE — Progress Notes (Signed)
 Per Dr. Parris chest xray looks to be clear, patient can be discharged home once on room air and ambulatory.

## 2024-04-11 NOTE — Anesthesia Procedure Notes (Signed)
 Procedure Name: Intubation Date/Time: 04/11/2024 7:53 AM  Performed by: Trudy Rankin LABOR, CRNAPre-anesthesia Checklist: Patient identified, Patient being monitored, Timeout performed, Emergency Drugs available and Suction available Patient Re-evaluated:Patient Re-evaluated prior to induction Oxygen Delivery Method: Circle System Utilized Preoxygenation: Pre-oxygenation with 100% oxygen Induction Type: IV induction and Rapid sequence Laryngoscope Size: Mac, 3 and McGrath Grade View: Grade I Tube type: Oral Tube size: 8.5 mm Number of attempts: 1 Airway Equipment and Method: Stylet Placement Confirmation: ETT inserted through vocal cords under direct vision, positive ETCO2 and breath sounds checked- equal and bilateral Secured at: 20 cm Tube secured with: Tape Dental Injury: Teeth and Oropharynx as per pre-operative assessment

## 2024-04-11 NOTE — H&P (Signed)
 PULMONOLOGY         Date: 04/11/2024,   MRN# [id number] Kaylee Gordon 20-Apr-1961     Admission                  Current   Referring provider: Dr Theotis   CHIEF COMPLAINT:   Bilateral lung nodules with hilar adenopathy   HISTORY OF PRESENT ILLNESS   This is a 63 yo female with a history of CHF, colonic polyps, depression, hypertension, hypothyroidism, iron  deficiency anemia prolonged QT, fibromyalgia personal history of malignant melanoma as well as lung nodules recent worsening upper respiratory status with inability to lay flat worsening cough as well as new lung nodules, in addition she has hilar and mediastinal adenopathy.  Today patient is here for tissue diagnosis to rule out possible neoplasm as she does have a strong family history of cancer as well as personal history of cancer.  Plan to perform bronchoscopic air weight examination as well as aspiration of any noted mucous plugging, bronchoalveolar lavage to rule out atypical indolent infection such as AFB, fungal and bacterial etiologies.  Will also perform robotic assisted navigational bronchoscopy for noted new lung nodules which are also bilateral and followed bronchial ultrasound evaluation for lymphadenopathy.  Will utilize GE 3D fluoroscopy for confirmation during the procedure as well as radial ultrasound.  Will perform transbronchial FNA as well as surgical pathology transbronchial forcep biopsies and cytopathologic brushings for cytology.  Will have rapid onsite evaluation with cytotechnologist present throughout the case.Reviewed risks/complications and benefits with patient, risks include infection, pneumothorax/pneumomediastinum which may require chest tube placement as well as overnight/prolonged hospitalization and possible mechanical ventilation. Other risks include bleeding and very rarely death.  Patient understands risks and wishes to proceed.  Additional questions were answered, and patient is aware  that post procedure patient will be going home with family and may experience cough with possible clots on expectoration as well as phlegm which may last few days as well as hoarseness of voice post intubation and mechanical ventilation.    PAST MEDICAL HISTORY   Past Medical History:  Diagnosis Date   Anxiety    Asthma    B12 deficiency    Bipolar disorder (HCC)    Borderline diabetes    CHF (congestive heart failure) (HCC)    Colon polyps    Depression    Dyspnea    Family history of adverse reaction to anesthesia    mom and dad-delayed emergence   Fibromyalgia    Hilar adenopathy    History of hiatal hernia    History of melanoma    History of methicillin resistant staphylococcus aureus (MRSA) 2010   Hypertension    Hypoparathyroidism    Hypothyroidism    IDA (iron  deficiency anemia)    Lung nodules    Mediastinal adenopathy    PONV (postoperative nausea and vomiting)    delayed emergence   Prolonged Q-T interval on ECG    Thyroid  disease      SURGICAL HISTORY   Past Surgical History:  Procedure Laterality Date   ANKLE SURGERY Right 2012   APPENDECTOMY     AUGMENTATION MAMMAPLASTY Bilateral    CESAREAN SECTION     x2   COLONOSCOPY WITH PROPOFOL  N/A 02/22/2015   Procedure: COLONOSCOPY WITH PROPOFOL ;  Surgeon: Donnice Vaughn Manes, MD;  Location[facility] ENDOSCOPY;  Service: Endoscopy;  Laterality: N/A;   COLONOSCOPY WITH PROPOFOL  N/A 08/08/2022   Procedure: COLONOSCOPY WITH PROPOFOL ;  Surgeon: Maryruth Ole DASEN, MD;  Location: [facility] ENDOSCOPY;  Service: Endoscopy;  Laterality: N/A;   ESOPHAGOGASTRODUODENOSCOPY (EGD) WITH PROPOFOL  N/A 02/22/2015   Procedure: ESOPHAGOGASTRODUODENOSCOPY (EGD) WITH PROPOFOL ;  Surgeon: Donnice Vaughn Manes, MD;  Location: [facility] ENDOSCOPY;  Service: Endoscopy;  Laterality: N/A;   ESOPHAGOGASTRODUODENOSCOPY (EGD) WITH PROPOFOL  N/A 02/11/2016   Procedure: ESOPHAGOGASTRODUODENOSCOPY (EGD) WITH PROPOFOL ;  Surgeon: Lamar ONEIDA Holmes, MD;   Location: [facility] ENDOSCOPY;  Service: Endoscopy;  Laterality: N/A;   ESOPHAGOGASTRODUODENOSCOPY (EGD) WITH PROPOFOL  N/A 08/08/2022   Procedure: ESOPHAGOGASTRODUODENOSCOPY (EGD) WITH PROPOFOL ;  Surgeon: Maryruth Ole ONEIDA, MD;  Location: [facility] ENDOSCOPY;  Service: Endoscopy;  Laterality: N/A;   HEMORRHOID SURGERY     NASAL SINUS SURGERY     SAVORY DILATION N/A 02/22/2015   Procedure: SAVORY DILATION;  Surgeon: Donnice Vaughn Manes, MD;  Location: [facility] ENDOSCOPY;  Service: Endoscopy;  Laterality: N/A;   SAVORY DILATION N/A 02/11/2016   Procedure: SAVORY DILATION;  Surgeon: Lamar ONEIDA Holmes, MD;  Location: [facility] ENDOSCOPY;  Service: Endoscopy;  Laterality: N/A;   SKIN CANCER EXCISION       FAMILY HISTORY   Family History  Problem Relation Age of Onset   Cancer Mother        colon cancer   Depression Mother    Anxiety disorder Mother    Heart disease Father    Cancer Father        ? neck cancer- still living.    Anxiety disorder Father    Depression Father    Colon cancer Maternal Aunt        died in 93s.    Hypoparathyroidism Son      SOCIAL HISTORY   Social History   Tobacco Use   Smoking status: Never   Smokeless tobacco: Never  Vaping Use   Vaping status: Never Used  Substance Use Topics   Alcohol use: Yes    Comment: rare wine   Drug use: No     MEDICATIONS    Home Medication:    Current Medication:  Current Facility-Administered Medications:    lactated ringers  infusion, , Intravenous, Continuous, Mazzoni, Andrea, MD   scopolamine  (TRANSDERM-SCOP) 1 MG/3DAYS 1 mg, 1 patch, Transdermal, Q72H, Mazzoni, Andrea, MD, 1 mg at 04/11/24 0710    ALLERGIES   Chlorzoxazone, Levofloxacin, Atorvastatin, Hydrochlorothiazide, Pollen extract, Pseudoephedrine, and Sudafed [pseudoephedrine hcl]     REVIEW OF SYSTEMS    Review of Systems:  Gen:  Denies  fever, sweats, chills weigh loss  HEENT: Denies blurred vision, double vision, ear pain, eye pain, hearing loss,  nose bleeds, sore throat Cardiac:  No dizziness, chest pain or heaviness, chest tightness,edema Resp:   reports dyspnea chronically  Gi: Denies swallowing difficulty, stomach pain, nausea or vomiting, diarrhea, constipation, bowel incontinence Gu:  Denies bladder incontinence, burning urine Ext:   Denies Joint pain, stiffness or swelling Skin: Denies  skin rash, easy bruising or bleeding or hives Endoc:  Denies polyuria, polydipsia , polyphagia or weight change Psych:   Denies depression, insomnia or hallucinations   Other:  All other systems negative   VS: Pulse (P) 79   Temp (!) (P) 97.4 F (36.3 C) (Temporal)   Resp (P) 20   SpO2 (P) 96%      PHYSICAL EXAM    GENERAL:NAD, no fevers, chills, no weakness no fatigue HEAD: Normocephalic, atraumatic.  EYES: Pupils equal, round, reactive to light. Extraocular muscles intact. No scleral icterus.  MOUTH: Moist mucosal membrane. Dentition intact. No abscess noted.  EAR, NOSE, THROAT: Clear without exudates. No external lesions.  NECK: Supple. No thyromegaly. No nodules. No JVD.  PULMONARY: decreased breath sounds with mild rhonchi worse at bases bilaterally.  CARDIOVASCULAR: S1 and S2. Regular rate and rhythm. No murmurs, rubs, or gallops. No edema. Pedal pulses 2+ bilaterally.  GASTROINTESTINAL: Soft, nontender, nondistended. No masses. Positive bowel sounds. No hepatosplenomegaly.  MUSCULOSKELETAL: No swelling, clubbing, or edema. Range of motion full in all extremities.  NEUROLOGIC: Cranial nerves II through XII are intact. No gross focal neurological deficits. Sensation intact. Reflexes intact.  SKIN: No ulceration, lesions, rashes, or cyanosis. Skin warm and dry. Turgor intact.  PSYCHIATRIC: Mood, affect within normal limits. The patient is awake, alert and oriented x 3. Insight, judgment intact.       IMAGING   LINICAL DATA:  Lung nodules, melanoma, mediastinal adenopathy.   EXAM: CT CHEST WITHOUT CONTRAST    TECHNIQUE: Multidetector CT imaging of the chest was performed using thin slice collimation for electromagnetic bronchoscopy planning purposes, without intravenous contrast.   RADIATION DOSE REDUCTION: This exam was performed according to the departmental dose-optimization program which includes automated exposure control, adjustment of the mA and/or kV according to patient size and/or use of iterative reconstruction technique.   COMPARISON:  03/28/2024, 11/16/2022 and PET 03/10/2020.   FINDINGS: Cardiovascular: Enlarged pulmonic trunk and heart. No pericardial effusion.   Mediastinum/Nodes: Thoracic inlet lymph nodes are not enlarged by CT size criteria. Mediastinal lymph nodes measure up to 11 mm in the low right paratracheal station. Hilar regions are difficult to definitively evaluate without IV contrast. No axillary adenopathy. Esophagus is grossly unremarkable.   Lungs/Pleura: Diffuse perilymphatic nodularity with areas of nodular and masslike consolidation in the lower lobes, as on 03/28/2024 but new from 11/16/2022. No pleural fluid. Airway is unremarkable.   Upper Abdomen: Moderate hiatal hernia. Visualized portions of the liver, adrenal glands, kidneys, spleen, pancreas, stomach and bowel are otherwise grossly unremarkable. No upper abdominal adenopathy.   Musculoskeletal: Degenerative changes in the spine.   IMPRESSION: 1. Extensive perilymphatic nodularity with areas of nodular and masslike consolidation in both lower lobes, as on 03/28/2024 but new from 11/16/2022. Findings are suspicious for sarcoid. However, lymphangitic carcinomatosis, in this patient with a history of melanoma, is also considered. 2. Moderate hiatal hernia. 3. Enlarged pulmonic trunk, indicative of pulmonary arterial hypertension.     Electronically Signed   By: Newell Eke M.D.   On: 04/09/2024 15:27    ASSESSMENT/PLAN     Bilateral lung nodules with hilar and mediastinal  adenopathy Plan to perform bronchoscopic airway examination as well as aspiration of any noted mucous plugging, bronchoalveolar lavage to rule out atypical indolent infection such as AFB, fungal and bacterial etiologies.  Will also perform robotic assisted navigational bronchoscopy for noted new lung nodules which are also bilateral and followed bronchial ultrasound evaluation for lymphadenopathy.  Will utilize GE 3D fluoroscopy for confirmation during the procedure as well as radial ultrasound.  Will perform transbronchial FNA as well as surgical pathology transbronchial forcep biopsies and cytopathologic brushings for cytology.  Will have rapid onsite evaluation with cytotechnologist present throughout the case.Reviewed risks/complications and benefits with patient, risks include infection, pneumothorax/pneumomediastinum which may require chest tube placement as well as overnight/prolonged hospitalization and possible mechanical ventilation. Other risks include bleeding and very rarely death.  Patient understands risks and wishes to proceed.  Additional questions were answered, and patient is aware that post procedure patient will be going home with family and may experience cough with possible clots on expectoration as well as phlegm which may last  few days as well as hoarseness of voice post intubation and mechanical ventilation.             Thank you for allowing me to participate in the care of this patient.   Patient/Family are satisfied with care plan and all questions have been answered.    Provider disclosure: Patient with at least one acute or chronic illness or injury that poses a threat to life or bodily function and is being managed actively during this encounter.  All of the below services have been performed independently by signing provider:  review of prior documentation from internal and or external health records.  Review of previous and current lab results.  Interview and  comprehensive assessment during patient visit today. Review of current and previous chest radiographs/CT scans. Discussion of management and test interpretation with health care team and patient/family.   This document was prepared using Dragon voice recognition software and may include unintentional dictation errors.     Kenyetta Fife, M.D.  Division of Pulmonary & Critical Care Medicine

## 2024-04-12 ENCOUNTER — Encounter: Payer: Self-pay | Admitting: Pulmonary Disease

## 2024-04-13 LAB — ACID FAST SMEAR (AFB, MYCOBACTERIA): Acid Fast Smear: NEGATIVE

## 2024-04-14 ENCOUNTER — Encounter: Payer: Self-pay | Admitting: Nurse Practitioner

## 2024-04-14 ENCOUNTER — Encounter: Payer: Self-pay | Admitting: Pulmonary Disease

## 2024-04-14 LAB — CULTURE, BAL-QUANTITATIVE W GRAM STAIN: Culture: NO GROWTH

## 2024-04-15 LAB — CYTOLOGY - NON PAP

## 2024-04-15 NOTE — Telephone Encounter (Signed)
Lauren will call pt

## 2024-04-16 ENCOUNTER — Encounter: Payer: Self-pay | Admitting: *Deleted

## 2024-04-16 ENCOUNTER — Other Ambulatory Visit: Payer: Self-pay | Admitting: Pulmonary Disease

## 2024-04-16 DIAGNOSIS — R918 Other nonspecific abnormal finding of lung field: Secondary | ICD-10-CM

## 2024-04-16 DIAGNOSIS — C3490 Malignant neoplasm of unspecified part of unspecified bronchus or lung: Secondary | ICD-10-CM

## 2024-04-16 DIAGNOSIS — R0602 Shortness of breath: Secondary | ICD-10-CM

## 2024-04-16 NOTE — Progress Notes (Signed)
 Called and spoke with patient to review upcoming appt with Dr. Anibal on Fri 10/3 at 10:30am. All question answered during call. Will follow up at clinic visit.

## 2024-04-17 LAB — SURGICAL PATHOLOGY

## 2024-04-18 ENCOUNTER — Ambulatory Visit
Admission: RE | Admit: 2024-04-18 | Discharge: 2024-04-18 | Disposition: A | Discharge status: Home / Self Care | Source: Ambulatory Visit | Attending: Pulmonary Disease | Admitting: Pulmonary Disease

## 2024-04-18 ENCOUNTER — Encounter: Payer: Self-pay | Admitting: Internal Medicine

## 2024-04-18 ENCOUNTER — Inpatient Hospital Stay

## 2024-04-18 ENCOUNTER — Telehealth: Admitting: Psychiatry

## 2024-04-18 ENCOUNTER — Encounter: Payer: Self-pay | Admitting: *Deleted

## 2024-04-18 ENCOUNTER — Inpatient Hospital Stay: Attending: Internal Medicine | Admitting: Internal Medicine

## 2024-04-18 VITALS — BP 116/74 | HR 75 | Temp 97.6°F | Resp 20 | Ht 64.5 in

## 2024-04-18 DIAGNOSIS — D47Z9 Other specified neoplasms of uncertain behavior of lymphoid, hematopoietic and related tissue: Secondary | ICD-10-CM | POA: Diagnosis not present

## 2024-04-18 DIAGNOSIS — Z79899 Other long term (current) drug therapy: Secondary | ICD-10-CM | POA: Diagnosis not present

## 2024-04-18 DIAGNOSIS — D509 Iron deficiency anemia, unspecified: Secondary | ICD-10-CM | POA: Diagnosis present

## 2024-04-18 DIAGNOSIS — R769 Abnormal immunological finding in serum, unspecified: Secondary | ICD-10-CM | POA: Insufficient documentation

## 2024-04-18 DIAGNOSIS — R5381 Other malaise: Secondary | ICD-10-CM | POA: Insufficient documentation

## 2024-04-18 DIAGNOSIS — Z85828 Personal history of other malignant neoplasm of skin: Secondary | ICD-10-CM | POA: Diagnosis not present

## 2024-04-18 DIAGNOSIS — Z860100 Personal history of colon polyps, unspecified: Secondary | ICD-10-CM | POA: Diagnosis not present

## 2024-04-18 DIAGNOSIS — E039 Hypothyroidism, unspecified: Secondary | ICD-10-CM | POA: Diagnosis not present

## 2024-04-18 DIAGNOSIS — C3490 Malignant neoplasm of unspecified part of unspecified bronchus or lung: Secondary | ICD-10-CM | POA: Insufficient documentation

## 2024-04-18 DIAGNOSIS — R5383 Other fatigue: Secondary | ICD-10-CM | POA: Insufficient documentation

## 2024-04-18 DIAGNOSIS — Z85820 Personal history of malignant melanoma of skin: Secondary | ICD-10-CM | POA: Diagnosis not present

## 2024-04-18 DIAGNOSIS — Z800 Family history of malignant neoplasm of digestive organs: Secondary | ICD-10-CM | POA: Diagnosis not present

## 2024-04-18 DIAGNOSIS — R918 Other nonspecific abnormal finding of lung field: Secondary | ICD-10-CM

## 2024-04-18 DIAGNOSIS — R0602 Shortness of breath: Secondary | ICD-10-CM | POA: Diagnosis not present

## 2024-04-18 DIAGNOSIS — K449 Diaphragmatic hernia without obstruction or gangrene: Secondary | ICD-10-CM | POA: Diagnosis not present

## 2024-04-18 DIAGNOSIS — I110 Hypertensive heart disease with heart failure: Secondary | ICD-10-CM | POA: Insufficient documentation

## 2024-04-18 DIAGNOSIS — Z86711 Personal history of pulmonary embolism: Secondary | ICD-10-CM | POA: Diagnosis not present

## 2024-04-18 DIAGNOSIS — R590 Localized enlarged lymph nodes: Secondary | ICD-10-CM | POA: Insufficient documentation

## 2024-04-18 DIAGNOSIS — Z808 Family history of malignant neoplasm of other organs or systems: Secondary | ICD-10-CM | POA: Diagnosis not present

## 2024-04-18 LAB — IRON AND TIBC
Iron: 47 ug/dL (ref 28–170)
Saturation Ratios: 10 % — ABNORMAL LOW (ref 10.4–31.8)
TIBC: 466 ug/dL — ABNORMAL HIGH (ref 250–450)
UIBC: 419 ug/dL

## 2024-04-18 LAB — GLUCOSE, CAPILLARY: Glucose-Capillary: 100 mg/dL — ABNORMAL HIGH (ref 70–99)

## 2024-04-18 LAB — HEPATITIS PANEL, ACUTE
HCV Ab: NONREACTIVE
Hep A IgM: NONREACTIVE
Hep B C IgM: NONREACTIVE
Hepatitis B Surface Ag: NONREACTIVE

## 2024-04-18 LAB — MISCELLANEOUS TEST

## 2024-04-18 LAB — LACTATE DEHYDROGENASE: LDH: 190 U/L (ref 98–192)

## 2024-04-18 LAB — FERRITIN: Ferritin: 9 ng/mL — ABNORMAL LOW (ref 11–307)

## 2024-04-18 MED ORDER — FLUDEOXYGLUCOSE F - 18 (FDG) INJECTION
8.7900 | Freq: Once | INTRAVENOUS | Status: AC | PRN
Start: 2024-04-18 — End: 2024-04-18
  Administered 2024-04-18: 8.79 via INTRAVENOUS

## 2024-04-18 NOTE — Progress Notes (Signed)
 Met with patient and her husband during follow up visit with Dr. Rennie. All questions answered during visit. Informed that will be called with further appts once scheduled. Contact info given and instructed to call with any questions or needs. Pt verbalized understanding.

## 2024-04-18 NOTE — Progress Notes (Signed)
 Fatigue/weakness: YES Dyspena: YES  Light headedness:  YES Blood in stool:  YES BRIGHT RED(CONSTIPATION/HEMORRHOIDS)  PET this morning. Fell Sunday, only bruises, legs felt weak/gave out, hit head ans shoulder.  Dr. Aleskerov has her on doxy for mucus/infection in her lungs. Bronchoscopy last Friday.

## 2024-04-18 NOTE — Progress Notes (Signed)
 [facility] CONSULT NOTE  Patient Care Team: Alla Amis, [name], [name], RN as Oncology Nurse Navigator  CHIEF COMPLAINTS/PURPOSE OF CONSULTATION: LUNG NODULES/ANEMIA-    HEMATOLOGY HISTORY  # ANEMIA[Hb; MCV-platelets- WBC; Iron  sat; ferritin;  GFR- CT/US ; EGD/colonoscopy-  HISTORY OF PRESENTING ILLNESS: Patient ambulating-independently. With husband.    Kaylee Gordon 63 y.o.  female with  pleasant patient with history of bipolar disorder/fibromyalgia with iron  deficiency anemia of unclear etiology question hiatal hernia-and abnormal Ig M-and kappa lambda light chain ratio is here for follow-up.  Since the last visit patient had a ongoing cough and shortness of breath which led to further workup-including a CT chest-followed by bronchoscopy and a PET scan.  Patient continues to complain of cough and difficulty breathing with exertion.  No significant night sweats or significant weight loss.   Review of Systems  Constitutional:  Positive for malaise/fatigue. Negative for chills, diaphoresis, fever and weight loss.  HENT:  Negative for nosebleeds and sore throat.   Eyes:  Negative for double vision.  Respiratory:  Negative for cough, hemoptysis, sputum production, shortness of breath and wheezing.   Cardiovascular:  Negative for chest pain, palpitations, orthopnea and leg swelling.  Gastrointestinal:  Negative for abdominal pain, blood in stool, constipation, diarrhea, heartburn, melena, nausea and vomiting.  Genitourinary:  Negative for dysuria, frequency and urgency.  Musculoskeletal:  Positive for back pain and joint pain.  Skin: Negative.  Negative for itching and rash.  Neurological:  Negative for dizziness, tingling, focal weakness, weakness and headaches.  Endo/Heme/Allergies:  Does not bruise/bleed easily.  Psychiatric/Behavioral:  Negative for  depression. The patient is not nervous/anxious and does not have insomnia.      MEDICAL HISTORY:  Past Medical History:  Diagnosis Date   Anxiety    Asthma    B12 deficiency    Bipolar disorder (HCC)    Borderline diabetes    CHF (congestive heart failure) (HCC)    Colon polyps    Depression    Dyspnea    Family history of adverse reaction to anesthesia    mom and dad-delayed emergence   Fibromyalgia    Hilar adenopathy    History of hiatal hernia    History of melanoma    History of methicillin resistant staphylococcus aureus (MRSA) 2010   Hypertension    Hypoparathyroidism    Hypothyroidism    IDA (iron  deficiency anemia)    Lung nodules    Mediastinal adenopathy    PONV (postoperative nausea and vomiting)    delayed emergence   Prolonged Q-T interval on ECG    Thyroid  disease     SURGICAL HISTORY: Past Surgical History:  Procedure Laterality Date   ANKLE SURGERY Right 2012   APPENDECTOMY     AUGMENTATION MAMMAPLASTY Bilateral    CESAREAN SECTION     x2   COLONOSCOPY WITH PROPOFOL  N/A 02/22/2015   Procedure: COLONOSCOPY WITH PROPOFOL ;  Surgeon: Donnice Vaughn Manes, MD;  Location: [facility] ENDOSCOPY;  Service: Endoscopy;  Laterality: N/A;   COLONOSCOPY WITH PROPOFOL  N/A 08/08/2022   Procedure: COLONOSCOPY WITH PROPOFOL ;  Surgeon: Maryruth Ole DASEN, MD;  Location: [facility] ENDOSCOPY;  Service: Endoscopy;  Laterality: N/A;   ESOPHAGOGASTRODUODENOSCOPY (EGD) WITH PROPOFOL  N/A 02/22/2015   Procedure: ESOPHAGOGASTRODUODENOSCOPY (EGD) WITH PROPOFOL ;  Surgeon: Donnice Vaughn Manes, MD;  Location: [facility] ENDOSCOPY;  Service: Endoscopy;  Laterality: N/A;   ESOPHAGOGASTRODUODENOSCOPY (EGD) WITH PROPOFOL  N/A 02/11/2016   Procedure:  ESOPHAGOGASTRODUODENOSCOPY (EGD) WITH PROPOFOL ;  Surgeon: Lamar ONEIDA Holmes, MD;  Location: [facility] ENDOSCOPY;  Service: Endoscopy;  Laterality: N/A;   ESOPHAGOGASTRODUODENOSCOPY (EGD) WITH PROPOFOL  N/A 08/08/2022   Procedure: ESOPHAGOGASTRODUODENOSCOPY (EGD)  WITH PROPOFOL ;  Surgeon: Maryruth Ole ONEIDA, MD;  Location: [facility] ENDOSCOPY;  Service: Endoscopy;  Laterality: N/A;   HEMORRHOID SURGERY     NASAL SINUS SURGERY     SAVORY DILATION N/A 02/22/2015   Procedure: SAVORY DILATION;  Surgeon: Donnice Vaughn Manes, MD;  Location: [facility] ENDOSCOPY;  Service: Endoscopy;  Laterality: N/A;   SAVORY DILATION N/A 02/11/2016   Procedure: SAVORY DILATION;  Surgeon: Lamar ONEIDA Holmes, MD;  Location: [facility] ENDOSCOPY;  Service: Endoscopy;  Laterality: N/A;   SKIN CANCER EXCISION     VIDEO BRONCHOSCOPY WITH ENDOBRONCHIAL NAVIGATION N/A 04/11/2024   Procedure: VIDEO BRONCHOSCOPY WITH ENDOBRONCHIAL NAVIGATION;  Surgeon: Parris Manna, MD;  Location: [facility] ORS;  Service: Thoracic;  Laterality: N/A;   VIDEO BRONCHOSCOPY WITH ENDOBRONCHIAL ULTRASOUND N/A 04/11/2024   Procedure: BRONCHOSCOPY, WITH EBUS;  Surgeon: Parris Manna, MD;  Location: [facility] ORS;  Service: Thoracic;  Laterality: N/A;    SOCIAL HISTORY: Social History   Socioeconomic History   Marital status: Married    Spouse name: Ozell   Number of children: Not on file   Years of education: Not on file   Highest education level: Not on file  Occupational History   Not on file  Tobacco Use   Smoking status: Never   Smokeless tobacco: Never  Vaping Use   Vaping status: Never Used  Substance and Sexual Activity   Alcohol use: Yes    Comment: rare wine   Drug use: No   Sexual activity: Not Currently    Partners: Male  Other Topics Concern   Not on file  Social History Narrative   Lives in [place]; taught high school- math; retd; no smoking; social alcohol.    Social Drivers of Corporate investment banker Strain: Low Risk  (08/07/2023)   Received from [facility]   Overall Financial Resource Strain (CARDIA)    Difficulty of Paying Living Expenses: Not hard at all  Food Insecurity: No Food Insecurity (02/25/2024)   Hunger Vital Sign    Worried About Running Out of Food in  the Last Year: Never true    Ran Out of Food in the Last Year: Never true  Transportation Needs: No Transportation Needs (02/25/2024)   PRAPARE - Administrator, Civil Service (Medical): No    Lack of Transportation (Non-Medical): No  Physical Activity: Unknown (05/16/2018)   Exercise Vital Sign    Days of Exercise per Week: 0 days    Minutes of Exercise per Session: Not on file  Stress: Stress Concern Present (05/16/2018)   [employer] of Occupational Health - Occupational Stress Questionnaire    Feeling of Stress : Very much  Social Connections: Unknown (05/16/2018)   Social Connection and Isolation Panel    Frequency of Communication with Friends and Family: Twice a week    Frequency of Social Gatherings with Friends and Family: Twice a week    Attends Religious Services: More than 4 times per year    Active Member of [employer] or Organizations: Not on file    Attends Banker Meetings: Not on file    Marital Status: Married  Intimate Partner Violence: Not At Risk (02/25/2024)   Humiliation, Afraid, Rape, and Kick questionnaire    Fear of Current or Ex-Partner: No  Emotionally Abused: No    Physically Abused: No    Sexually Abused: No    FAMILY HISTORY: Family History  Problem Relation Age of Onset   Cancer Mother        colon cancer   Depression Mother    Anxiety disorder Mother    Heart disease Father    Cancer Father        ? neck cancer- still living.    Anxiety disorder Father    Depression Father    Colon cancer Maternal Aunt        died in 58s.    Hypoparathyroidism Son     ALLERGIES:  is allergic to chlorzoxazone, levofloxacin, atorvastatin, hydrochlorothiazide, pollen extract, pseudoephedrine, and sudafed [pseudoephedrine hcl].  MEDICATIONS:  Current Outpatient Medications  Medication Sig Dispense Refill   busPIRone  (BUSPAR ) 10 MG tablet Take 4 tablets (40 mg total) by mouth as directed. TAKE 1 TABLET BY MOUTH 3 TIMES A DAY AND 1  EXTRA TABLET AS NEEDED FOR SEVERE ANXIETY 120 tablet 2   calcitRIOL  (ROCALTROL ) 0.25 MCG capsule Take 1 capsule (0.25 mcg total) by mouth daily. 90 capsule 3   doxycycline  (VIBRA -TABS) 100 MG tablet Take 100 mg by mouth 2 (two) times daily.     escitalopram  (LEXAPRO ) 10 MG tablet Take 1.5 tablets (15 mg total) by mouth daily. (Patient taking differently: Take 15 mg by mouth every morning.) 135 tablet 0   esomeprazole (NEXIUM) 40 MG capsule Take 1 capsule by mouth every morning.     fenofibrate 160 MG tablet Take 1 tablet by mouth daily.     fluticasone-salmeterol (ADVAIR) 250-50 MCG/ACT AEPB Inhale 1 puff into the lungs in the morning and at bedtime.     gabapentin  (NEURONTIN ) 300 MG capsule Take 300 mg by mouth See admin instructions. 2 CAP AM, 1 CAP Q noon, 2 CAP pm,     ipratropium (ATROVENT ) 0.03 % nasal spray Place 1 spray into both nostrils 2 (two) times daily.     isosorbide mononitrate (IMDUR) 30 MG 24 hr tablet Take 30 mg by mouth 2 (two) times daily.     lamoTRIgine  (LAMICTAL ) 200 MG tablet Take 0.5 tablets (100 mg total) by mouth 2 (two) times daily. 90 tablet 1   levothyroxine  (SYNTHROID ) 75 MCG tablet Take 1 tablet (75 mcg total) by mouth daily. (Patient taking differently: Take 75 mcg by mouth daily before breakfast.) 90 tablet 3   lisinopril (ZESTRIL) 10 MG tablet Take 10 mg by mouth daily.     lovastatin (MEVACOR) 20 MG tablet Take 20 mg by mouth at bedtime.     montelukast (SINGULAIR) 10 MG tablet Take 10 mg by mouth at bedtime.     ondansetron  (ZOFRAN ) 8 MG tablet Take 1 tablet (8 mg total) by mouth every 8 (eight) hours as needed for nausea or vomiting (nausea related to iron  infusions). 60 tablet 0   prazosin  (MINIPRESS ) 2 MG capsule Take 1 capsule (2 mg total) by mouth at bedtime. (Patient taking differently: Take 2 mg by mouth at bedtime. For Nightmares) 90 capsule 1   propranolol ER (INDERAL LA) 80 MG 24 hr capsule Take 80 mg by mouth every morning.     RESTASIS 0.05 %  ophthalmic emulsion Place 1 drop into both eyes 2 (two) times daily.     rOPINIRole  (REQUIP ) 0.25 MG tablet TAKE 1 TABLET BY MOUTH 3 TIMES A DAY 90 tablet 2   temazepam  (RESTORIL ) 30 MG capsule Take 1 capsule (30 mg total) by mouth  at bedtime. 30 capsule 2   TRYPTYR 0.003 % SOLN Apply 1 drop to eye 2 (two) times daily.     No current facility-administered medications for this visit.   PHYSICAL EXAMINATION:   Vitals:   04/18/24 1025  BP: 116/74  Pulse: 75  Resp: 20  Temp: 97.6 F (36.4 C)  SpO2: 95%   There were no vitals filed for this visit.   Physical Exam Vitals and nursing note reviewed.  HENT:     Head: Normocephalic and atraumatic.     Mouth/Throat:     Pharynx: Oropharynx is clear.  Eyes:     Extraocular Movements: Extraocular movements intact.     Pupils: Pupils are equal, round, and reactive to light.  Cardiovascular:     Rate and Rhythm: Normal rate and regular rhythm.  Pulmonary:     Comments: Decreased breath sounds bilaterally.  Abdominal:     Palpations: Abdomen is soft.  Musculoskeletal:        General: Normal range of motion.     Cervical back: Normal range of motion.  Skin:    General: Skin is warm.  Neurological:     General: No focal deficit present.     Mental Status: She is alert and oriented to person, place, and time.  Psychiatric:        Behavior: Behavior normal.        Judgment: Judgment normal.      LABORATORY DATA:  I have reviewed the data as listed Lab Results  Component Value Date   WBC 6.3 03/25/2024   HGB 11.1 (L) 03/25/2024   HCT 36.1 03/25/2024   MCV 81.3 03/25/2024   PLT 247 03/25/2024   Recent Labs    07/24/23 1539 02/25/24 1501  NA 142 138  K 3.8 4.0  CL 106 106  CO2 26 23  GLUCOSE 149* 89  BUN 21 24*  CREATININE 1.26* 1.18*  CALCIUM 9.0 9.0  GFRNONAA  --  52*  PROT  --  6.8  ALBUMIN  --  4.0  AST  --  22  ALT  --  12  ALKPHOS  --  71  BILITOT  --  0.5     NM PET Image Initial (PI) Skull Base To  Thigh (F-18 FDG) Result Date: 04/18/2024 CLINICAL DATA:  Subsequent treatment strategy for lung nodules. EXAM: NUCLEAR MEDICINE PET SKULL BASE TO THIGH TECHNIQUE: 8.8 mCi F-18 FDG was injected intravenously. Full-ring PET imaging was performed from the skull base to thigh after the radiotracer. CT data was obtained and used for attenuation correction and anatomic localization. Fasting blood glucose: 100 mg/dl COMPARISON:  CT chest 90/75/7974, PET 03/10/2020. CT abdomen pelvis 01/13/2022. FINDINGS: Mediastinal blood pool activity: SUV max 2.5 Liver activity: SUV max NA NECK: No abnormal hypermetabolism. Incidental CT findings: None. CHEST: Borderline hypermetabolic small mediastinal lymph nodes with index low right paratracheal lymph node measuring 6 mm, SUV max 2.5. Extensive hypermetabolic perilymphatic nodularity with nodular masslike areas of consolidation bilaterally, measuring up to 1.8 x 3.0 cm in the superior segment right lower lobe (6/54), SUV max 18.0. Incidental CT findings: Heart is enlarged.  No pericardial or pleural effusion. ABDOMEN/PELVIS: Hypermetabolic abdominal retroperitoneal and small bowel mesenteric adenopathy. Index nodal mass in the small bowel mesentery measures 3.3 x 4.7 cm (6/110), SUV max 6.0. Focal uptake in the proximal ascending colon, without a CT correlate. Incidental CT findings: Kidneys are non rotated.  Large hiatal hernia. SKELETON: No abnormal hypermetabolism. Incidental CT findings: Degenerative changes  in the spine. IMPRESSION: 1. Extensive hypermetabolic perilymphatic nodularity with nodular and masslike areas of consolidation in the lungs as well as borderline hypermetabolic mediastinal lymph nodes and hypermetabolic abdominal retroperitoneal and mesenteric lymph nodes, findings worrisome for lymphoma. 2. Focal hypermetabolism in the proximal ascending colon, without a definitive CT correlate. Please correlate with recent colonoscopy. 3. Large hiatal hernia.  Electronically Signed   By: Newell Eke M.D.   On: 04/18/2024 09:59   DG Chest Port 1 View Result Date: 04/11/2024 CLINICAL DATA:  Post bronchoscopy. EXAM: PORTABLE CHEST 1 VIEW COMPARISON:  Chest CT 04/09/2024, chest x-ray 05/16/2018 FINDINGS: Lungs are hypoinflated demonstrate mild diffuse coarse interstitial changes compatible recent CT findings. No lobar consolidation or effusion. No pneumothorax post bronchoscopy. Cardiomediastinal silhouette is within normal. Degenerative change of the spine. Remainder of the exam is unremarkable. IMPRESSION: Hypoinflation with mild diffuse coarse interstitial changes compatible with recent CT findings. No pneumothorax post bronchoscopy. Electronically Signed   By: Toribio Agreste M.D.   On: 04/11/2024 09:47   DG C-Arm 1-60 Min-No Report Result Date: 04/11/2024 Fluoroscopy was utilized by the requesting physician.  No radiographic interpretation.   CT SUPER D CHEST WO MONARCH PILOT Result Date: 04/09/2024 CLINICAL DATA:  Lung nodules, melanoma, mediastinal adenopathy. EXAM: CT CHEST WITHOUT CONTRAST TECHNIQUE: Multidetector CT imaging of the chest was performed using thin slice collimation for electromagnetic bronchoscopy planning purposes, without intravenous contrast. RADIATION DOSE REDUCTION: This exam was performed according to the departmental dose-optimization program which includes automated exposure control, adjustment of the mA and/or kV according to patient size and/or use of iterative reconstruction technique. COMPARISON:  03/28/2024, 11/16/2022 and PET 03/10/2020. FINDINGS: Cardiovascular: Enlarged pulmonic trunk and heart. No pericardial effusion. Mediastinum/Nodes: Thoracic inlet lymph nodes are not enlarged by CT size criteria. Mediastinal lymph nodes measure up to 11 mm in the low right paratracheal station. Hilar regions are difficult to definitively evaluate without IV contrast. No axillary adenopathy. Esophagus is grossly unremarkable.  Lungs/Pleura: Diffuse perilymphatic nodularity with areas of nodular and masslike consolidation in the lower lobes, as on 03/28/2024 but new from 11/16/2022. No pleural fluid. Airway is unremarkable. Upper Abdomen: Moderate hiatal hernia. Visualized portions of the liver, adrenal glands, kidneys, spleen, pancreas, stomach and bowel are otherwise grossly unremarkable. No upper abdominal adenopathy. Musculoskeletal: Degenerative changes in the spine. IMPRESSION: 1. Extensive perilymphatic nodularity with areas of nodular and masslike consolidation in both lower lobes, as on 03/28/2024 but new from 11/16/2022. Findings are suspicious for sarcoid. However, lymphangitic carcinomatosis, in this patient with a history of melanoma, is also considered. 2. Moderate hiatal hernia. 3. Enlarged pulmonic trunk, indicative of pulmonary arterial hypertension. Electronically Signed   By: Newell Eke M.D.   On: 04/09/2024 15:27   CT Angio Chest Pulmonary Embolism (PE) W or WO Contrast Result Date: 03/28/2024 CLINICAL DATA:  Shortness of breath, elevated D-dimer EXAM: CT ANGIOGRAPHY CHEST WITH CONTRAST TECHNIQUE: Multidetector CT imaging of the chest was performed using the standard protocol during bolus administration of intravenous contrast. Multiplanar CT image reconstructions and MIPs were obtained to evaluate the vascular anatomy. RADIATION DOSE REDUCTION: This exam was performed according to the departmental dose-optimization program which includes automated exposure control, adjustment of the mA and/or kV according to patient size and/or use of iterative reconstruction technique. CONTRAST:  75mL OMNIPAQUE  IOHEXOL  350 MG/ML SOLN COMPARISON:  CT chest 11/16/2022 FINDINGS: Cardiovascular: Satisfactory opacification of the pulmonary arteries to the segmental level. No evidence of pulmonary embolism. Borderline to mild cardiomegaly. No pericardial effusion. No pericardial effusion.  Nonaneurysmal aorta. Mediastinum/Nodes:  Patent trachea. No thyroid  mass. Interval mediastinal and hilar adenopathy. Right hilar nodes measuring up to 14 mm. Left hilar nodes measuring up to 11 mm. Vague right paratracheal node measuring up to 17 mm. Right posterior hilar node measures 12 mm on series 6, image 54. Esophagus within normal limits Lungs/Pleura: No sizable pleural effusion. No pneumothorax. Interval widespread diffuse reticulo nodular densities, peribronchovascular nodularity and miliary pattern of pulmonary nodules. Slightly larger pulmonary nodules are also visualized, for example 11 mm left upper lobe pulmonary nodule on series 8, image 49 or 8 mm right middle lobe pulmonary nodule on series 8, image 59. Ovoid bilateral lower lobe irregular consolidations, for example 3 cm ovoid consolidation in the right lower lobe on series 8, image 66. Upper Abdomen: No acute finding Musculoskeletal: Bilateral breast implants. No acute or suspicious osseous abnormality. Review of the MIP images confirms the above findings. IMPRESSION: 1. Negative for acute pulmonary embolus. 2. Interval widespread diffuse reticulonodular densities, peribronchovascular nodularity and miliary pattern of pulmonary nodules. Slightly larger pulmonary nodules are also visualized. Ovoid bilateral lower lobe irregular consolidations. Primary concern is for metastatic disease, however differential includes infection (viral or potential mycobacterial organism) and other causes of miliary nodules. Ovoid masslike consolidations in the lower lobes are probably neoplastic in etiology but nodular configuration of pneumonia is also possible. 3. Interval mediastinal and hilar adenopathy. Findings could be reactive or metastatic in etiology. These results will be called to the ordering clinician or representative by the Radiologist Assistant, and communication documented in the PACS or [employer]. Electronically Signed   By: Luke Bun M.D.   On: 03/28/2024 17:20    ASSESSMENT  & PLAN:   Multiple lung nodules on CT # SEP 2025- Interval widespread diffuse reticulonodular densities, peribronchovascular nodularity and miliary pattern of pulmonary nodules. Slightly larger pulmonary nodules are also visualized. Ovoid bilateral lower lobe irregular consolidations. Primary concern is for metastatic disease, however differential includes infection (viral or potential mycobacterial organism) and other causes of miliary nodules. Ovoid masslike consolidations in the lower lobes are probably neoplastic in etiology but nodular configuration of pneumonia is also possible;  Interval mediastinal and hilar adenopathy.   # PET OCT 3rd. 2025- Extensive hypermetabolic perilymphatic nodularity with nodular and masslike areas of consolidation in the lungs as well as borderline hypermetabolic mediastinal lymph nodes and hypermetabolic abdominal retroperitoneal and mesenteric lymph nodes, findings worrisome for lymphoma; Focal hypermetabolism in the proximal ascending colon, without a definitive CT correlate. JAN 2024- COLO_NEG. 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  CD10 IS NEGATIVE.  BCL6 IS PARTIALLY POSITIVE; BCL-2 IS POSITIVE IN BOTH THE B AND T CELLS.  CD5 AND CD43 HIGHLIGHT THE T      CELLS.  CYCLIN D1 IS APPROPRIATELY NEGATIVE.  CD23 HIGHLIGHTS A MINIMAL      FOLLICULAR DENDRITIC CELL [place].  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  GIVEN THE LIMITED NATURE OF THE      BIOPSY TO FURTHER WORKUP IS NOT POSSIBLE.    #  RARE STRIPS OF BRONCHIAL EPITHELIAL CELLS AND PREDOMINANTLY SCANT SMALL  LYMPHOCYTES POSITIVE FOR  MALIGNANCY- CANNOT RULE OUT NON-SMALL CELL CARCINOMA [limited sample]   # August 2025 -  Iron  deficiency continue oral iron . [part of anemia workup]IGM-immunofixation; kappa/lambda light chain ratio abnormal-suspect this is related to possible lymphoproliferative disorder.  Will discuss with radiology regarding repeating an biopsy of the abdominal/retroperitoneal lymph nodes.   # Patient was obviously very nervous and tearful-however unable to give any further prognostic/treatment information based on above ambiguous biopsy.  Discussed with Dr.A patient's pulmonary.-   # DISPOSITION: # labs today- cbc/cmp; LDH: beta 2 microglobulin; hepatitis B panel;Ghep C MM panel; K/L light chains;; iron  studies; ferratin; CEA; Ca 19-9; peripheral flow cytometry   # follow up TBd- Dr.B  # I reviewed the blood work- with the patient in detail; also reviewed the imaging independently [as summarized above]; and with the patient in detail.       Cindy JONELLE Joe, MD 04/18/2024 1:51 PM

## 2024-04-18 NOTE — Progress Notes (Signed)
 Blood collected for [employer] xF+. Order placed. Financial assistance completed and approved for $100 out of pocket cost.

## 2024-04-18 NOTE — Assessment & Plan Note (Addendum)
#   SEP 2025- Interval widespread diffuse reticulonodular densities, peribronchovascular nodularity and miliary pattern of pulmonary nodules. Slightly larger pulmonary nodules are also visualized. Ovoid bilateral lower lobe irregular consolidations. Primary concern is for metastatic disease, however differential includes infection (viral or potential mycobacterial organism) and other causes of miliary nodules. Ovoid masslike consolidations in the lower lobes are probably neoplastic in etiology but nodular configuration of pneumonia is also possible;  Interval mediastinal and hilar adenopathy.   # PET OCT 3rd. 2025- Extensive hypermetabolic perilymphatic nodularity with nodular and masslike areas of consolidation in the lungs as well as borderline hypermetabolic mediastinal lymph nodes and hypermetabolic abdominal retroperitoneal and mesenteric lymph nodes, findings worrisome for lymphoma; Focal hypermetabolism in the proximal ascending colon, without a definitive CT correlate. JAN 2024- COLO_NEG. 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  CD10 IS NEGATIVE.  BCL6 IS PARTIALLY POSITIVE; BCL-2 IS POSITIVE IN BOTH THE B AND T CELLS.  CD5 AND CD43 HIGHLIGHT THE T      CELLS.  CYCLIN D1 IS APPROPRIATELY NEGATIVE.  CD23 HIGHLIGHTS A MINIMAL      FOLLICULAR DENDRITIC CELL MESHWORK.  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  GIVEN THE LIMITED NATURE OF THE      BIOPSY TO FURTHER WORKUP IS NOT POSSIBLE.    #  RARE STRIPS OF BRONCHIAL EPITHELIAL CELLS AND PREDOMINANTLY SCANT SMALL  LYMPHOCYTES POSITIVE FOR MALIGNANCY- CANNOT RULE OUT NON-SMALL CELL  CARCINOMA [limited sample]   # August 2025 -  Iron  deficiency continue oral iron . [part of anemia workup]IGM-immunofixation; kappa/lambda light chain ratio abnormal-suspect this is related to possible lymphoproliferative disorder.  Will discuss with radiology regarding repeating an biopsy of the abdominal/retroperitoneal lymph nodes.   # Patient was obviously very nervous and tearful-however unable to give any further prognostic/treatment information based on above ambiguous biopsy.  Discussed with Dr.A patient's pulmonary.-   # DISPOSITION: # labs today- cbc/cmp; LDH: beta 2 microglobulin; hepatitis B panel;Ghep C MM panel; K/L light chains;; iron  studies; ferratin; CEA; Ca 19-9; peripheral flow cytometry   # follow up [facility]- Dr.B  # I reviewed the blood work- with the patient in detail; also reviewed the imaging independently [as summarized above]; and with the patient in detail.

## 2024-04-19 ENCOUNTER — Telehealth: Payer: Self-pay | Admitting: Internal Medicine

## 2024-04-19 NOTE — Telephone Encounter (Signed)
 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  Discussed with the patient and husband-reviewed the bone marrow biopsy procedure pros and cons.  They are in agreement.  Currently awaiting-to hear back from radiology regarding retroperitoneal lymph node biopsy.   Hayley/Michelle-please order bone marrow biopsy/ [check if this could be done along with LN biopsy].  In my absence-Please have patient follow up with MD- Nilsa.Rao/Yu]- Lauren can coordinate the follow up with MDs- based on the biopsy results.   Hayley- please talk to me on Monday.   Thanks, GB

## 2024-04-21 ENCOUNTER — Other Ambulatory Visit: Payer: Self-pay | Admitting: *Deleted

## 2024-04-21 DIAGNOSIS — Z862 Personal history of diseases of the blood and blood-forming organs and certain disorders involving the immune mechanism: Secondary | ICD-10-CM

## 2024-04-21 DIAGNOSIS — R590 Localized enlarged lymph nodes: Secondary | ICD-10-CM

## 2024-04-21 DIAGNOSIS — R918 Other nonspecific abnormal finding of lung field: Secondary | ICD-10-CM

## 2024-04-21 NOTE — Addendum Note (Signed)
 Addended by: VERDENE GILLS on: 04/21/2024 08:46 AM   Modules accepted: Orders

## 2024-04-22 LAB — MULTIPLE MYELOMA PANEL, SERUM
Albumin SerPl Elph-Mcnc: 3.5 g/dL (ref 2.9–4.4)
Albumin/Glob SerPl: 1.3 (ref 0.7–1.7)
Alpha 1: 0.3 g/dL (ref 0.0–0.4)
Alpha2 Glob SerPl Elph-Mcnc: 0.8 g/dL (ref 0.4–1.0)
B-Globulin SerPl Elph-Mcnc: 1.1 g/dL (ref 0.7–1.3)
Gamma Glob SerPl Elph-Mcnc: 0.5 g/dL (ref 0.4–1.8)
Globulin, Total: 2.7 g/dL (ref 2.2–3.9)
IgA: 52 mg/dL — ABNORMAL LOW (ref 87–352)
IgG (Immunoglobin G), Serum: 482 mg/dL — ABNORMAL LOW (ref 586–1602)
IgM (Immunoglobulin M), Srm: 100 mg/dL (ref 26–217)
M Protein SerPl Elph-Mcnc: 0.1 g/dL — ABNORMAL HIGH
Total Protein ELP: 6.2 g/dL (ref 6.0–8.5)

## 2024-04-24 NOTE — Progress Notes (Signed)
 Philip Cornet, MD sent to [facility] PROCEDURE / BIOPSY REVIEW Date: 04/24/24  Requested Biopsy site: Bone marrow and RP lymph node Reason for request: Needs diagnosis Imaging review: PET image 110 and 114 (mesenteric nodes)  Decision: Approved Imaging modality to perform: CT Schedule with: Moderate Sedation Schedule for: Any VIR  Additional comments: The large mesenteric lymph nodes look amendable but depends on bowel (anterior approach).  There is also a request for bone marrow biopsy.  Can try to do both in CT on same day.  Please contact me with questions, concerns, or if issue pertaining to this request arise.  Cornet JONELLE Philip, MD [facility]

## 2024-04-25 ENCOUNTER — Telehealth: Payer: Self-pay | Admitting: *Deleted

## 2024-04-25 LAB — COMP PANEL: LEUKEMIA/LYMPHOMA

## 2024-04-25 LAB — CANCER ANTIGEN 19-9: CA 19-9: 4 U/mL (ref 0–35)

## 2024-04-25 LAB — KAPPA/LAMBDA LIGHT CHAINS
Kappa free light chain: 57.2 mg/L — ABNORMAL HIGH (ref 3.3–19.4)
Kappa, lambda light chain ratio: 7.53 — ABNORMAL HIGH (ref 0.26–1.65)
Lambda free light chains: 7.6 mg/L (ref 5.7–26.3)

## 2024-04-25 LAB — CEA: CEA: 0.7 ng/mL (ref 0.0–4.7)

## 2024-04-25 LAB — BETA 2 MICROGLOBULIN, SERUM: Beta-2 Microglobulin: 2.7 mg/L — ABNORMAL HIGH (ref 0.6–2.4)

## 2024-04-25 NOTE — Telephone Encounter (Signed)
 Contacted patient by phone.  She is currently waiting for a definitive diagnosis from oncology.  She has good support system from her family right now.  She wants to keep her appointment on Monday with this provider and would like to change it in and in person if possible.  I will route this message to the staff to change the appointment to an in person visit on October 13.

## 2024-04-25 NOTE — Telephone Encounter (Signed)
 [facility] called to credit LLYMPL TEST it was collected 10.3.25. Message sent by [facility] in [facility] lab.

## 2024-04-28 ENCOUNTER — Ambulatory Visit (INDEPENDENT_AMBULATORY_CARE_PROVIDER_SITE_OTHER): Admitting: Psychiatry

## 2024-04-28 ENCOUNTER — Other Ambulatory Visit: Payer: Self-pay

## 2024-04-28 ENCOUNTER — Encounter: Payer: Self-pay | Admitting: Psychiatry

## 2024-04-28 VITALS — BP 130/81 | HR 84 | Temp 97.2°F | Ht 64.5 in | Wt 161.8 lb

## 2024-04-28 DIAGNOSIS — G4701 Insomnia due to medical condition: Secondary | ICD-10-CM

## 2024-04-28 DIAGNOSIS — F3176 Bipolar disorder, in full remission, most recent episode depressed: Secondary | ICD-10-CM

## 2024-04-28 DIAGNOSIS — F411 Generalized anxiety disorder: Secondary | ICD-10-CM

## 2024-04-28 NOTE — Progress Notes (Signed)
 BH MD OP Progress Note  04/28/2024 5:02 PM Kaylee Gordon  MRN:  [id number]  Chief Complaint:  Chief Complaint  Patient presents with   Follow-up   Anxiety   Depression   Medication Refill   Discussed the use of AI scribe software for clinical note transcription with the patient, who gave verbal consent to proceed.  History of Present Illness Kaylee Gordon is a 63 year old Caucasian female, has a history of bipolar disorder, GAD, fibromyalgia, currently under the care of oncology to rule out lymphoma, here for evaluation of anxiety and stress related to a recent cancer diagnosis and ongoing medical workup.  Significant anxiety and emotional distress related to her recent cancer diagnosis and the ongoing diagnostic process affect her daily life. She feels scared about upcoming treatments and expresses particular concern about potential side effects and their impact on her functioning. Waiting for test results and uncertainty about her prognosis increase her anxiety.   To cope, she uses distraction strategies such as spending time with friends, attending concerts, and listening to music. She engages in activities like going to the beach and attending musicals with her family to help manage her anxiety. Her faith and involvement in church provide additional comfort and support during this period.  She currently takes Buspar , which she finds very helpful for managing anxiety, especially during periods of heightened stress related to her medical situation. Her medication regimen also includes Lexapro  15 mg, Lamictal  200 mg in the morning and 100 mg at night, Minipress , ropinirole , and temazepam . She reports that her current medications help her cope with anxiety and emotional distress.  Exhaustion and low energy, particularly after social activities or outings, impact her well-being. She sleeps a lot after recent events, and she attributes this to both physical exhaustion and emotional  overwhelm. Generally, she sleeps well except when she feels unwell or particularly anxious.   Recent bereavement after the loss of her family dog adds to her emotional burden during an already stressful period. Her cats provide comfort and attentiveness.     Visit Diagnosis:    ICD-10-CM   1. Bipolar 1 disorder, depressed, full remission  F31.76     2. GAD (generalized anxiety disorder)  F41.1     3. 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  Past trials of medications like Ambien, Lunesta , Sonata , Belsomra   Past Medical History:  Past Medical History:  Diagnosis Date   Anxiety    Asthma    B12 deficiency    Bipolar disorder (HCC)    Borderline diabetes    CHF (congestive heart failure) (HCC)    Colon polyps    Depression    Dyspnea    Family history of adverse reaction to anesthesia    mom and dad-delayed emergence   Fibromyalgia    Hilar adenopathy    History of hiatal hernia    History of melanoma    History of methicillin resistant staphylococcus aureus (MRSA) 2010   Hypertension    Hypoparathyroidism    Hypothyroidism    IDA (iron  deficiency anemia)    Lung nodules    Mediastinal adenopathy    PONV (postoperative nausea and vomiting)    delayed emergence   Prolonged Q-T interval on ECG    Thyroid  disease     Past Surgical History:  Procedure Laterality Date   ANKLE SURGERY Right  2012   APPENDECTOMY     AUGMENTATION MAMMAPLASTY Bilateral    CESAREAN SECTION     x2   COLONOSCOPY WITH PROPOFOL  N/A 02/22/2015   Procedure: COLONOSCOPY WITH PROPOFOL ;  Surgeon: Donnice Vaughn Manes, MD;  Location: [facility] ENDOSCOPY;  Service: Endoscopy;  Laterality: N/A;   COLONOSCOPY WITH PROPOFOL  N/A 08/08/2022   Procedure: COLONOSCOPY WITH PROPOFOL ;  Surgeon: Maryruth Ole DASEN, MD;  Location: [facility] ENDOSCOPY;  Service: Endoscopy;  Laterality: N/A;    ESOPHAGOGASTRODUODENOSCOPY (EGD) WITH PROPOFOL  N/A 02/22/2015   Procedure: ESOPHAGOGASTRODUODENOSCOPY (EGD) WITH PROPOFOL ;  Surgeon: Donnice Vaughn Manes, MD;  Location: [facility] ENDOSCOPY;  Service: Endoscopy;  Laterality: N/A;   ESOPHAGOGASTRODUODENOSCOPY (EGD) WITH PROPOFOL  N/A 02/11/2016   Procedure: ESOPHAGOGASTRODUODENOSCOPY (EGD) WITH PROPOFOL ;  Surgeon: Lamar DASEN Holmes, MD;  Location: [facility] ENDOSCOPY;  Service: Endoscopy;  Laterality: N/A;   ESOPHAGOGASTRODUODENOSCOPY (EGD) WITH PROPOFOL  N/A 08/08/2022   Procedure: ESOPHAGOGASTRODUODENOSCOPY (EGD) WITH PROPOFOL ;  Surgeon: Maryruth Ole DASEN, MD;  Location: [facility] ENDOSCOPY;  Service: Endoscopy;  Laterality: N/A;   HEMORRHOID SURGERY     NASAL SINUS SURGERY     SAVORY DILATION N/A 02/22/2015   Procedure: SAVORY DILATION;  Surgeon: Donnice Vaughn Manes, MD;  Location: [facility] ENDOSCOPY;  Service: Endoscopy;  Laterality: N/A;   SAVORY DILATION N/A 02/11/2016   Procedure: SAVORY DILATION;  Surgeon: Lamar DASEN Holmes, MD;  Location: [facility] ENDOSCOPY;  Service: Endoscopy;  Laterality: N/A;   SKIN CANCER EXCISION     VIDEO BRONCHOSCOPY WITH ENDOBRONCHIAL NAVIGATION N/A 04/11/2024   Procedure: VIDEO BRONCHOSCOPY WITH ENDOBRONCHIAL NAVIGATION;  Surgeon: Parris Manna, MD;  Location: [facility] ORS;  Service: Thoracic;  Laterality: N/A;   VIDEO BRONCHOSCOPY WITH ENDOBRONCHIAL ULTRASOUND N/A 04/11/2024   Procedure: BRONCHOSCOPY, WITH EBUS;  Surgeon: Parris Manna, MD;  Location: [facility] ORS;  Service: Thoracic;  Laterality: N/A;    Family Psychiatric History: I have reviewed family psychiatric history from progress note on 09/11/2018.  Family History:  Family History  Problem Relation Age of Onset   Cancer Mother        colon cancer   Depression Mother    Anxiety disorder Mother    Heart disease Father    Cancer Father        ? neck cancer- still living.    Anxiety disorder Father    Depression Father    Colon cancer Maternal Aunt        died in 56s.     Hypoparathyroidism Son     Social History: I have reviewed social history from progress note on 09/11/2018. Social History   Socioeconomic History   Marital status: Married    Spouse name: Ozell   Number of children: Not on file   Years of education: Not on file   Highest education level: Not on file  Occupational History   Not on file  Tobacco Use   Smoking status: Never   Smokeless tobacco: Never  Vaping Use   Vaping status: Never Used  Substance and Sexual Activity   Alcohol use: Yes    Comment: rare wine   Drug use: No   Sexual activity: Not Currently    Partners: Male  Other Topics Concern   Not on file  Social History Narrative   Lives in [place]; taught high school- math; retd; no smoking; social alcohol.    Social Drivers of Corporate Investment Banker Strain: Low Risk  (08/07/2023)   Received from [facility]   Overall Financial Resource Strain (CARDIA)    Difficulty of  Paying Living Expenses: Not hard at all  Food Insecurity: No Food Insecurity (02/25/2024)   Hunger Vital Sign    Worried About Running Out of Food in the Last Year: Never true    Ran Out of Food in the Last Year: Never true  Transportation Needs: No Transportation Needs (02/25/2024)   PRAPARE - Administrator, Civil Service (Medical): No    Lack of Transportation (Non-Medical): No  Physical Activity: Unknown (05/16/2018)   Exercise Vital Sign    Days of Exercise per Week: 0 days    Minutes of Exercise per Session: Not on file  Stress: Stress Concern Present (05/16/2018)   [employer] of Occupational Health - Occupational Stress Questionnaire    Feeling of Stress : Very much  Social Connections: Unknown (05/16/2018)   Social Connection and Isolation Panel    Frequency of Communication with Friends and Family: Twice a week    Frequency of Social Gatherings with Friends and Family: Twice a week    Attends Religious Services: More than 4 times per year     Active Member of [employer] or Organizations: Not on file    Attends Banker Meetings: Not on file    Marital Status: Married    Allergies:  Allergies  Allergen Reactions   Chlorzoxazone Anaphylaxis and Hives   Levofloxacin Other (See Comments) and Nausea Only    Lethargic, Flu-like symptoms, Hot flashes    Atorvastatin    Hydrochlorothiazide Other (See Comments)    Intolerance - dry lips   Pollen Extract    Pseudoephedrine Other (See Comments)   Sudafed [Pseudoephedrine Hcl]     Heart racing    Metabolic Disorder Labs: Lab Results  Component Value Date   HGBA1C 5.9 (H) 02/16/2021   Lab Results  Component Value Date   PROLACTIN 18.3 07/22/2014   Lab Results  Component Value Date   CHOL 184 02/16/2021   TRIG 179 (H) 02/16/2021   HDL 37 (L) 02/16/2021   CHOLHDL 5.0 (H) 02/16/2021   LDLCALC 115 (H) 02/16/2021   Lab Results  Component Value Date   TSH 1.94 02/12/2024   TSH 2.03 07/24/2023    Therapeutic Level Labs: No results found for: LITHIUM Lab Results  Component Value Date   VALPROATE 39 (L) 02/04/2019   No results found for: CBMZ  Current Medications: Current Outpatient Medications  Medication Sig Dispense Refill   busPIRone  (BUSPAR ) 10 MG tablet Take 4 tablets (40 mg total) by mouth as directed. TAKE 1 TABLET BY MOUTH 3 TIMES A DAY AND 1 EXTRA TABLET AS NEEDED FOR SEVERE ANXIETY 120 tablet 2   calcitRIOL  (ROCALTROL ) 0.25 MCG capsule Take 1 capsule (0.25 mcg total) by mouth daily. 90 capsule 3   escitalopram  (LEXAPRO ) 10 MG tablet Take 1.5 tablets (15 mg total) by mouth daily. (Patient taking differently: Take 15 mg by mouth every morning.) 135 tablet 0   esomeprazole (NEXIUM) 40 MG capsule Take 1 capsule by mouth every morning.     fenofibrate 160 MG tablet Take 1 tablet by mouth daily.     fluticasone-salmeterol (ADVAIR) 250-50 MCG/ACT AEPB Inhale 1 puff into the lungs in the morning and at bedtime.     gabapentin  (NEURONTIN ) 300 MG capsule  Take 300 mg by mouth See admin instructions. 2 CAP AM, 1 CAP Q noon, 2 CAP pm,     ipratropium (ATROVENT ) 0.03 % nasal spray Place 1 spray into both nostrils 2 (two) times daily.     isosorbide  mononitrate (IMDUR) 30 MG 24 hr tablet Take 30 mg by mouth 2 (two) times daily.     lamoTRIgine  (LAMICTAL ) 200 MG tablet Take 0.5 tablets (100 mg total) by mouth 2 (two) times daily. 90 tablet 1   levothyroxine  (SYNTHROID ) 75 MCG tablet Take 1 tablet (75 mcg total) by mouth daily. (Patient taking differently: Take 75 mcg by mouth daily before breakfast.) 90 tablet 3   lisinopril (ZESTRIL) 10 MG tablet Take 10 mg by mouth daily.     lovastatin (MEVACOR) 20 MG tablet Take 20 mg by mouth at bedtime.     montelukast (SINGULAIR) 10 MG tablet Take 10 mg by mouth at bedtime.     ondansetron  (ZOFRAN ) 8 MG tablet Take 1 tablet (8 mg total) by mouth every 8 (eight) hours as needed for nausea or vomiting (nausea related to iron  infusions). 60 tablet 0   prazosin  (MINIPRESS ) 2 MG capsule Take 1 capsule (2 mg total) by mouth at bedtime. (Patient taking differently: Take 2 mg by mouth at bedtime. For Nightmares) 90 capsule 1   propranolol ER (INDERAL LA) 80 MG 24 hr capsule Take 80 mg by mouth every morning.     RESTASIS 0.05 % ophthalmic emulsion Place 1 drop into both eyes 2 (two) times daily.     rOPINIRole  (REQUIP ) 0.25 MG tablet TAKE 1 TABLET BY MOUTH 3 TIMES A DAY 90 tablet 2   temazepam  (RESTORIL ) 30 MG capsule Take 1 capsule (30 mg total) by mouth at bedtime. 30 capsule 2   TRYPTYR 0.003 % SOLN Apply 1 drop to eye 2 (two) times daily.     No current facility-administered medications for this visit.     Musculoskeletal: Strength & Muscle Tone: within normal limits Gait & Station: normal Patient leans: N/A  Psychiatric Specialty Exam: Review of Systems  HENT:  Negative for sinus pressure.   Psychiatric/Behavioral:  The patient is nervous/anxious.     Blood pressure 130/81, pulse 84, temperature (!) 97.2  F (36.2 C), temperature source Temporal, height 5' 4.5 (1.638 m), weight 161 lb 12.8 oz (73.4 kg).Body mass index is 27.34 kg/m.  General Appearance: Casual  Eye Contact:  Fair  Speech:  Clear and Coherent  Volume:  Normal  Mood:  Anxious  Affect:  Congruent  Thought Process:  Goal Directed and Descriptions of Associations: Intact  Orientation:  Full (Time, Place, and Person)  Thought Content: Logical   Suicidal Thoughts:  No  Homicidal Thoughts:  No  Memory:  Immediate;   Fair Recent;   Fair Remote;   Fair  Judgement:  Fair  Insight:  Fair  Psychomotor Activity:  Normal  Concentration:  Concentration: Fair and Attention Span: Fair  Recall:  [employer] of Knowledge: Fair  Language: Fair  Akathisia:  No  Handed:  Right  AIMS (if indicated): not done  Assets:  Communication Skills Desire for Improvement Housing Social Support Talents/Skills Transportation  ADL's:  Intact  Cognition: WNL  Sleep:  varies , excessive at times due to fatigue, recent cancer diagnosis   Screenings: AIMS    Flowsheet Row Video Visit from 12/08/2021 in [facility]  AIMS Total Score 0   GAD-7    Flowsheet Row Office Visit from 03/18/2024 in [facility] Office Visit from 04/11/2023 in [facility] Office Visit from 11/21/2022 in [facility] Video Visit from 03/28/2022 in [facility] Video Visit  from 02/06/2022 in [facility]  Total GAD-7 Score 21 21 14 10 7    PHQ2-9    Flowsheet Row Office Visit from 04/18/2024 in [facility] Dept Of [place]. [facility] Office Visit from 03/25/2024 in [facility] Dept Of [place]. [facility] Office Visit from 03/18/2024 in [facility] Office Visit from 02/25/2024 in [facility] Dept Of [place]. [facility] Office Visit from 04/11/2023 in [facility]  PHQ-2 Total Score 6 2 3 6 2   PHQ-9 Total Score 15 18 18 15 11    Flowsheet Row Office Visit from 04/28/2024 in [facility] Office Visit from 04/18/2024 in [facility] Of [place]. [facility] Admission (Discharged) from 04/11/2024 in [facility]  C-SSRS RISK CATEGORY No Risk No Risk No Risk     Assessment and Plan: Kaylee Gordon is a 63 year old Caucasian female who has a history of bipolar disorder, GAD, insomnia was evaluated in office today.  Discussed assessment and plan as noted below.  1. Bipolar 1 disorder, depressed, full remission Currently denies any significant mood swings mania or depression symptoms. Continue Lamotrigine  200 mg daily  2. GAD (generalized anxiety disorder)-unstable Ongoing anxiety due to recent cancer diagnosis and ongoing workup. Continue Lexapro  15 mg daily Continue BuSpar  30 mg daily in divided dosage with option to take an extra 10 mg as needed for severe anxiety Encouraged to continue psychotherapy sessions with Ms. Ellouise Hummer, has upcoming appointment.  3. Insomnia due to medical condition - stable Currently denies any significant sleep problems although does have excessive sleepiness due to recent fatigue and cancer diagnosis on and off. Continue sleep hygiene techniques. Continue Temazepam  30 mg at bedtime, could hold the temazepam  if excessive sleepiness. Continue Prazosin  2 mg at bedtime for nightmares Continue Requip  0.25 mg 3 times a day Continue Gabapentin  for fibromyalgia as prescribed. Reviewed [place] PMP AWARxE  Follow-up Follow-up in clinic in 4 weeks or sooner if needed.    Collaboration of Care: Collaboration of Care: Referral  or follow-up with counselor/therapist AEB Encouraged to continue psychotherapy sessions, I have coordinated care with Ms. Ellouise Hummer.  Patient/Guardian was advised Release of Information must be obtained prior to any record release in order to collaborate their care with an outside provider. Patient/Guardian was advised if they have not already done so to contact the registration department to sign all necessary forms in order for us  to release information regarding their care.   Consent: Patient/Guardian gives verbal consent for treatment and assignment of benefits for services provided during this visit. Patient/Guardian expressed understanding and agreed to proceed.   This note was generated in part or whole with voice recognition software. Voice recognition is usually quite accurate but there are transcription errors that can and very often do occur. I apologize for any typographical errors that were not detected and corrected.    Lorimer Tiberio, MD 04/29/2024, 8:45 AM

## 2024-04-29 ENCOUNTER — Encounter: Payer: Self-pay | Admitting: Internal Medicine

## 2024-04-29 NOTE — Telephone Encounter (Signed)
 Pt scheduled for lymph node biopsy and bone marrow biopsy on 10/22. Pt will follow up with Dr. Melanee on 10/29 to discuss results.

## 2024-05-02 ENCOUNTER — Other Ambulatory Visit

## 2024-05-02 ENCOUNTER — Ambulatory Visit: Admitting: Internal Medicine

## 2024-05-02 ENCOUNTER — Ambulatory Visit: Admitting: Nurse Practitioner

## 2024-05-02 ENCOUNTER — Ambulatory Visit

## 2024-05-06 ENCOUNTER — Other Ambulatory Visit: Payer: Self-pay | Admitting: Physician Assistant

## 2024-05-06 DIAGNOSIS — Z01818 Encounter for other preprocedural examination: Secondary | ICD-10-CM

## 2024-05-06 NOTE — Progress Notes (Signed)
 Patient for CT RP biopsy & BMB on Wed 05/07/24, I called and spoke with the patient on the phone and gave pre-procedure instructions. Pt was made aware to be here at 8:30a, NPO after MN prior to procedure as well as driver post procedure/recovery/discharge. Pt stated understanding.  Called 05/06/24

## 2024-05-07 ENCOUNTER — Other Ambulatory Visit: Payer: Self-pay

## 2024-05-07 ENCOUNTER — Ambulatory Visit
Admission: RE | Admit: 2024-05-07 | Discharge: 2024-05-07 | Disposition: A | Discharge status: Home / Self Care | Source: Ambulatory Visit | Attending: Internal Medicine | Admitting: Internal Medicine

## 2024-05-07 DIAGNOSIS — Z9049 Acquired absence of other specified parts of digestive tract: Secondary | ICD-10-CM | POA: Diagnosis not present

## 2024-05-07 DIAGNOSIS — Z79899 Other long term (current) drug therapy: Secondary | ICD-10-CM | POA: Diagnosis not present

## 2024-05-07 DIAGNOSIS — Z85820 Personal history of malignant melanoma of skin: Secondary | ICD-10-CM | POA: Diagnosis not present

## 2024-05-07 DIAGNOSIS — K449 Diaphragmatic hernia without obstruction or gangrene: Secondary | ICD-10-CM | POA: Insufficient documentation

## 2024-05-07 DIAGNOSIS — R918 Other nonspecific abnormal finding of lung field: Secondary | ICD-10-CM | POA: Insufficient documentation

## 2024-05-07 DIAGNOSIS — C8510 Unspecified B-cell lymphoma, unspecified site: Secondary | ICD-10-CM | POA: Diagnosis not present

## 2024-05-07 DIAGNOSIS — R590 Localized enlarged lymph nodes: Secondary | ICD-10-CM | POA: Diagnosis not present

## 2024-05-07 DIAGNOSIS — Z862 Personal history of diseases of the blood and blood-forming organs and certain disorders involving the immune mechanism: Secondary | ICD-10-CM | POA: Diagnosis not present

## 2024-05-07 DIAGNOSIS — M797 Fibromyalgia: Secondary | ICD-10-CM | POA: Insufficient documentation

## 2024-05-07 DIAGNOSIS — I509 Heart failure, unspecified: Secondary | ICD-10-CM | POA: Diagnosis not present

## 2024-05-07 DIAGNOSIS — I110 Hypertensive heart disease with heart failure: Secondary | ICD-10-CM | POA: Diagnosis not present

## 2024-05-07 DIAGNOSIS — Z79890 Hormone replacement therapy (postmenopausal): Secondary | ICD-10-CM | POA: Insufficient documentation

## 2024-05-07 DIAGNOSIS — R1909 Other intra-abdominal and pelvic swelling, mass and lump: Secondary | ICD-10-CM | POA: Diagnosis not present

## 2024-05-07 DIAGNOSIS — D649 Anemia, unspecified: Secondary | ICD-10-CM | POA: Insufficient documentation

## 2024-05-07 DIAGNOSIS — Z7951 Long term (current) use of inhaled steroids: Secondary | ICD-10-CM | POA: Insufficient documentation

## 2024-05-07 DIAGNOSIS — F319 Bipolar disorder, unspecified: Secondary | ICD-10-CM | POA: Diagnosis not present

## 2024-05-07 DIAGNOSIS — Z01818 Encounter for other preprocedural examination: Secondary | ICD-10-CM | POA: Diagnosis present

## 2024-05-07 DIAGNOSIS — F419 Anxiety disorder, unspecified: Secondary | ICD-10-CM | POA: Diagnosis not present

## 2024-05-07 DIAGNOSIS — D509 Iron deficiency anemia, unspecified: Secondary | ICD-10-CM | POA: Insufficient documentation

## 2024-05-07 LAB — CBC WITH DIFFERENTIAL/PLATELET
Abs Immature Granulocytes: 0.02 K/uL (ref 0.00–0.07)
Basophils Absolute: 0.1 K/uL (ref 0.0–0.1)
Basophils Relative: 1 %
Eosinophils Absolute: 0.2 K/uL (ref 0.0–0.5)
Eosinophils Relative: 3 %
HCT: 35.5 % — ABNORMAL LOW (ref 36.0–46.0)
Hemoglobin: 11.2 g/dL — ABNORMAL LOW (ref 12.0–15.0)
Immature Granulocytes: 0 %
Lymphocytes Relative: 24 %
Lymphs Abs: 1.2 K/uL (ref 0.7–4.0)
MCH: 25.4 pg — ABNORMAL LOW (ref 26.0–34.0)
MCHC: 31.5 g/dL (ref 30.0–36.0)
MCV: 80.5 fL (ref 80.0–100.0)
Monocytes Absolute: 0.7 K/uL (ref 0.1–1.0)
Monocytes Relative: 14 %
Neutro Abs: 2.9 K/uL (ref 1.7–7.7)
Neutrophils Relative %: 58 %
Platelets: 272 K/uL (ref 150–400)
RBC: 4.41 MIL/uL (ref 3.87–5.11)
RDW: 15.8 % — ABNORMAL HIGH (ref 11.5–15.5)
WBC: 5 K/uL (ref 4.0–10.5)
nRBC: 0 % (ref 0.0–0.2)

## 2024-05-07 LAB — PROTIME-INR
INR: 0.9 (ref 0.8–1.2)
Prothrombin Time: 12.9 s (ref 11.4–15.2)

## 2024-05-07 MED ORDER — FENTANYL CITRATE (PF) 100 MCG/2ML IJ SOLN
INTRAMUSCULAR | Status: AC
  Filled 2024-05-07: qty 2

## 2024-05-07 MED ORDER — HEPARIN SOD (PORK) LOCK FLUSH 100 UNIT/ML IV SOLN
INTRAVENOUS | Status: AC
  Filled 2024-05-07: qty 5

## 2024-05-07 MED ORDER — MIDAZOLAM HCL (PF) 2 MG/2ML IJ SOLN
INTRAMUSCULAR | Status: AC | PRN
  Administered 2024-05-07: 1 mg via INTRAVENOUS

## 2024-05-07 MED ORDER — MIDAZOLAM HCL 2 MG/2ML IJ SOLN
INTRAMUSCULAR | Status: AC
  Filled 2024-05-07: qty 4

## 2024-05-07 MED ORDER — SODIUM CHLORIDE 0.9 % IV SOLN: INTRAVENOUS | Status: DC

## 2024-05-07 MED ORDER — LIDOCAINE HCL (PF) 1 % IJ SOLN
20.0000 mL | Freq: Once | INTRAMUSCULAR | Status: AC
  Administered 2024-05-07: 20 mL
  Filled 2024-05-07: qty 20

## 2024-05-07 MED ORDER — HEPARIN SOD (PORK) LOCK FLUSH 100 UNIT/ML IV SOLN
500.0000 [IU] | Freq: Once | INTRAVENOUS | Status: AC
  Administered 2024-05-07: 500 [IU] via INTRAVENOUS
  Filled 2024-05-07: qty 5

## 2024-05-07 MED ORDER — FENTANYL CITRATE (PF) 100 MCG/2ML IJ SOLN
INTRAMUSCULAR | Status: AC | PRN
  Administered 2024-05-07: 50 ug via INTRAVENOUS

## 2024-05-07 NOTE — Procedures (Signed)
 Interventional Radiology Procedure Note  Procedure: Abdominal mass and bone marrow biopsy  Complications: None  Estimated Blood Loss: < 10 mL  Findings: 18 G core biopsy of central abdominal mass performed under CT guidance.  2 core samples obtained and sent to Pathology.  13Ga core biopsy and aspirate of right iliac bone marrow under CT guidance.  Care and aspirate sent to pathology.  Cordella DELENA Banner, MD

## 2024-05-07 NOTE — H&P (Signed)
 Chief Complaint: Patient was seen in consultation today for retroperitoneal mass  Referring Physician(s): Rennie Cindy SAUNDERS  Supervising Physician: Jenna Hacker  Patient Status: [facility] - Out-pt  History of Present Illness: Kaylee Gordon is a 63 y.o. female with history of anxiety, bipolar disorder, fibromyalgia, CHF, melanoma, iron  deficiency anemia who was recently evaluated by Dr. Rennie for low blood counts as well as  abnormal Ig M-and kappa lambda light chain ratio.  CT Chest 04/09/24 showed: 1. Extensive perilymphatic nodularity with areas of nodular and masslike consolidation in both lower lobes, as on 03/28/2024 but new from 11/16/2022. Findings are suspicious for sarcoid. However, lymphangitic carcinomatosis, in this patient with a history of melanoma, is also considered. 2. Moderate hiatal hernia. 3. Enlarged pulmonic trunk, indicative of pulmonary arterial hypertension.  Subsequent PET 04/18/24 showed: 1. Extensive hypermetabolic perilymphatic nodularity with nodular and masslike areas of consolidation in the lungs as well as borderline hypermetabolic mediastinal lymph nodes and hypermetabolic abdominal retroperitoneal and mesenteric lymph nodes, findings worrisome for lymphoma. 2. Focal hypermetabolism in the proximal ascending colon, without a definitive CT correlate. Please correlate with recent colonoscopy. 3. Large hiatal hernia.  Patient presents today for retroperitoneal mass and bone marrow biopsy.  Case reviewed by Dr. Jenna who approves retroperitoneal lymph node biopsy as the most likely to yield a complete diagnosis.  Kaylee Gordon is in her usual state of health today  She has have O2 sats of 87-88% on presentation.  She is not on O2 at home, but states she has been monitoring her O2 and this has been her baseline.  She has been NPO.  She understands risks,benefits, and goals of hte procedure today and is agreeable toproceed.   Her husband  is available for post-procedure care and transportation.   She is a FULL CODE.  Past Medical History:  Diagnosis Date   Anxiety    Asthma    B12 deficiency    Bipolar disorder (HCC)    Borderline diabetes    CHF (congestive heart failure) (HCC)    Colon polyps    Depression    Dyspnea    Family history of adverse reaction to anesthesia    mom and dad-delayed emergence   Fibromyalgia    Hilar adenopathy    History of hiatal hernia    History of melanoma    History of methicillin resistant staphylococcus aureus (MRSA) 2010   Hypertension    Hypoparathyroidism    Hypothyroidism    IDA (iron  deficiency anemia)    Lung nodules    Mediastinal adenopathy    PONV (postoperative nausea and vomiting)    delayed emergence   Prolonged Q-T interval on ECG    Thyroid  disease     Past Surgical History:  Procedure Laterality Date   ANKLE SURGERY Right 2012   APPENDECTOMY     AUGMENTATION MAMMAPLASTY Bilateral    CESAREAN SECTION     x2   COLONOSCOPY WITH PROPOFOL  N/A 02/22/2015   Procedure: COLONOSCOPY WITH PROPOFOL ;  Surgeon: Donnice Vaughn Manes, MD;  Location: [facility] ENDOSCOPY;  Service: Endoscopy;  Laterality: N/A;   COLONOSCOPY WITH PROPOFOL  N/A 08/08/2022   Procedure: COLONOSCOPY WITH PROPOFOL ;  Surgeon: Maryruth Ole DASEN, MD;  Location: [facility] ENDOSCOPY;  Service: Endoscopy;  Laterality: N/A;   ESOPHAGOGASTRODUODENOSCOPY (EGD) WITH PROPOFOL  N/A 02/22/2015   Procedure: ESOPHAGOGASTRODUODENOSCOPY (EGD) WITH PROPOFOL ;  Surgeon: Donnice Vaughn Manes, MD;  Location: [facility] ENDOSCOPY;  Service: Endoscopy;  Laterality: N/A;   ESOPHAGOGASTRODUODENOSCOPY (EGD) WITH PROPOFOL  N/A 02/11/2016   Procedure:  ESOPHAGOGASTRODUODENOSCOPY (EGD) WITH PROPOFOL ;  Surgeon: Lamar ONEIDA Holmes, MD;  Location: [facility] ENDOSCOPY;  Service: Endoscopy;  Laterality: N/A;   ESOPHAGOGASTRODUODENOSCOPY (EGD) WITH PROPOFOL  N/A 08/08/2022   Procedure: ESOPHAGOGASTRODUODENOSCOPY (EGD) WITH PROPOFOL ;  Surgeon: Maryruth Ole ONEIDA, MD;  Location: [facility] ENDOSCOPY;  Service: Endoscopy;  Laterality: N/A;   HEMORRHOID SURGERY     NASAL SINUS SURGERY     SAVORY DILATION N/A 02/22/2015   Procedure: SAVORY DILATION;  Surgeon: Donnice Vaughn Manes, MD;  Location: [facility] ENDOSCOPY;  Service: Endoscopy;  Laterality: N/A;   SAVORY DILATION N/A 02/11/2016   Procedure: SAVORY DILATION;  Surgeon: Lamar ONEIDA Holmes, MD;  Location: [facility] ENDOSCOPY;  Service: Endoscopy;  Laterality: N/A;   SKIN CANCER EXCISION     VIDEO BRONCHOSCOPY WITH ENDOBRONCHIAL NAVIGATION N/A 04/11/2024   Procedure: VIDEO BRONCHOSCOPY WITH ENDOBRONCHIAL NAVIGATION;  Surgeon: Parris Manna, MD;  Location: [facility] ORS;  Service: Thoracic;  Laterality: N/A;   VIDEO BRONCHOSCOPY WITH ENDOBRONCHIAL ULTRASOUND N/A 04/11/2024   Procedure: BRONCHOSCOPY, WITH EBUS;  Surgeon: Parris Manna, MD;  Location: [facility] ORS;  Service: Thoracic;  Laterality: N/A;    Allergies: Chlorzoxazone, Levofloxacin, Atorvastatin, Hydrochlorothiazide, Pollen extract, Pseudoephedrine, and Sudafed [pseudoephedrine hcl]  Medications: Prior to Admission medications   Medication Sig Start Date End Date Taking? Authorizing Provider  busPIRone  (BUSPAR ) 10 MG tablet Take 4 tablets (40 mg total) by mouth as directed. TAKE 1 TABLET BY MOUTH 3 TIMES A DAY AND 1 EXTRA TABLET AS NEEDED FOR SEVERE ANXIETY 03/18/24   Eappen, Saramma, MD  calcitRIOL  (ROCALTROL ) 0.25 MCG capsule Take 1 capsule (0.25 mcg total) by mouth daily. 07/25/23   Thapa, [place], MD  escitalopram  (LEXAPRO ) 10 MG tablet Take 1.5 tablets (15 mg total) by mouth daily. Patient taking differently: Take 15 mg by mouth every morning. 03/18/24   Eappen, Saramma, MD  esomeprazole (NEXIUM) 40 MG capsule Take 1 capsule by mouth every morning. 06/04/23   [provider]  fenofibrate 160 MG tablet Take 1 tablet by mouth daily. 12/17/20   [provider]  fluticasone-salmeterol (ADVAIR) 250-50 MCG/ACT AEPB Inhale 1 puff into the lungs in  the morning and at bedtime. 03/21/24 03/21/25  [provider]  gabapentin  (NEURONTIN ) 300 MG capsule Take 300 mg by mouth See admin instructions. 2 CAP AM, 1 CAP Q noon, 2 CAP pm, 10/06/19   [provider]  ipratropium (ATROVENT ) 0.03 % nasal spray Place 1 spray into both nostrils 2 (two) times daily.    [provider]  isosorbide mononitrate (IMDUR) 30 MG 24 hr tablet Take 30 mg by mouth 2 (two) times daily. 02/06/23   [provider]  lamoTRIgine  (LAMICTAL ) 200 MG tablet Take 0.5 tablets (100 mg total) by mouth 2 (two) times daily. 02/12/24   Eappen, Saramma, MD  levothyroxine  (SYNTHROID ) 75 MCG tablet Take 1 tablet (75 mcg total) by mouth daily. Patient taking differently: Take 75 mcg by mouth daily before breakfast. 02/14/24   Thapa, [place], MD  lisinopril (ZESTRIL) 10 MG tablet Take 10 mg by mouth daily. 05/26/19   [provider]  lovastatin (MEVACOR) 20 MG tablet Take 20 mg by mouth at bedtime.    [provider]  montelukast (SINGULAIR) 10 MG tablet Take 10 mg by mouth at bedtime. 03/27/24 03/27/25  [provider]  ondansetron  (ZOFRAN ) 8 MG tablet Take 1 tablet (8 mg total) by mouth every 8 (eight) hours as needed for nausea or vomiting (nausea related to iron  infusions). 03/03/24   Brahmanday, Govinda R, MD  prazosin  (  MINIPRESS ) 2 MG capsule Take 1 capsule (2 mg total) by mouth at bedtime. Patient taking differently: Take 2 mg by mouth at bedtime. For Nightmares 01/02/24   Eappen, Saramma, MD  propranolol ER (INDERAL LA) 80 MG 24 hr capsule Take 80 mg by mouth every morning. 10/08/22   [provider]  RESTASIS 0.05 % ophthalmic emulsion Place 1 drop into both eyes 2 (two) times daily. 03/07/24   [provider]  rOPINIRole  (REQUIP ) 0.25 MG tablet TAKE 1 TABLET BY MOUTH 3 TIMES A DAY 04/06/24   Eappen, Saramma, MD  temazepam  (RESTORIL ) 30 MG capsule Take 1 capsule (30 mg total) by mouth at bedtime. 03/10/24 06/08/24  Eappen,  Saramma, MD  TRYPTYR 0.003 % SOLN Apply 1 drop to eye 2 (two) times daily. 03/05/24   [provider]     Family History  Problem Relation Age of Onset   Cancer Mother        colon cancer   Depression Mother    Anxiety disorder Mother    Heart disease Father    Cancer Father        ? neck cancer- still living.    Anxiety disorder Father    Depression Father    Colon cancer Maternal Aunt        died in 78s.    Hypoparathyroidism Son     Social History   Socioeconomic History   Marital status: Married    Spouse name: Ozell   Number of children: Not on file   Years of education: Not on file   Highest education level: Not on file  Occupational History   Not on file  Tobacco Use   Smoking status: Never   Smokeless tobacco: Never  Vaping Use   Vaping status: Never Used  Substance and Sexual Activity   Alcohol use: Yes    Comment: rare wine   Drug use: No   Sexual activity: Not Currently    Partners: Male  Other Topics Concern   Not on file  Social History Narrative   Lives in [place]; taught high school- math; retd; no smoking; social alcohol.    Social Drivers of Corporate investment banker Strain: Low Risk  (08/07/2023)   Received from [facility]   Overall Financial Resource Strain (CARDIA)    Difficulty of Paying Living Expenses: Not hard at all  Food Insecurity: No Food Insecurity (02/25/2024)   Hunger Vital Sign    Worried About Running Out of Food in the Last Year: Never true    Ran Out of Food in the Last Year: Never true  Transportation Needs: No Transportation Needs (02/25/2024)   PRAPARE - Administrator, Civil Service (Medical): No    Lack of Transportation (Non-Medical): No  Physical Activity: Unknown (05/16/2018)   Exercise Vital Sign    Days of Exercise per Week: 0 days    Minutes of Exercise per Session: Not on file  Stress: Stress Concern Present (05/16/2018)   [employer] of Occupational Health  - Occupational Stress Questionnaire    Feeling of Stress : Very much  Social Connections: Unknown (05/16/2018)   Social Connection and Isolation Panel    Frequency of Communication with Friends and Family: Twice a week    Frequency of Social Gatherings with Friends and Family: Twice a week    Attends Religious Services: More than 4 times per year    Active Member of [employer] or Organizations: Not on file  Attends Banker Meetings: Not on file    Marital Status: Married     Review of Systems: A 12 point ROS discussed and pertinent positives are indicated in the HPI above.  All other systems are negative.  Review of Systems  Constitutional:  Negative for fatigue and fever.  Respiratory:  Negative for cough and shortness of breath.   Cardiovascular:  Negative for chest pain.  Gastrointestinal:  Negative for abdominal pain, nausea and vomiting.  Musculoskeletal:  Negative for back pain.  Psychiatric/Behavioral:  Negative for behavioral problems and confusion.     Vital Signs: BP (!) 112/97   Pulse 77   Temp 98.2 F (36.8 C) (Oral)   Resp 17   Ht 5' 4 (1.626 m)   Wt 155 lb 12.8 oz (70.7 kg)   SpO2 92%   BMI 26.74 kg/m   Physical Exam Vitals and nursing note reviewed.  Constitutional:      General: She is not in acute distress.    Appearance: Normal appearance. She is not ill-appearing.  HENT:     Mouth/Throat:     Mouth: Mucous membranes are moist.     Pharynx: Oropharynx is clear.  Cardiovascular:     Rate and Rhythm: Normal rate and regular rhythm.  Pulmonary:     Effort: Pulmonary effort is normal.     Breath sounds: Normal breath sounds.  Abdominal:     General: Abdomen is flat. There is no distension.     Palpations: Abdomen is soft.  Skin:    General: Skin is warm and dry.  Neurological:     General: No focal deficit present.     Mental Status: She is alert and oriented to person, place, and time. Mental status is at baseline.  Psychiatric:         Mood and Affect: Mood normal.        Behavior: Behavior normal.        Thought Content: Thought content normal.        Judgment: Judgment normal.      MD Evaluation Airway: WNL Heart: WNL Abdomen: WNL Chest/ Lungs: WNL ASA  Classification: 3 Mallampati/Airway Score: Two   Imaging: NM PET Image Initial (PI) Skull Base To Thigh (F-18 FDG) Result Date: 04/18/2024 CLINICAL DATA:  Subsequent treatment strategy for lung nodules. EXAM: NUCLEAR MEDICINE PET SKULL BASE TO THIGH TECHNIQUE: 8.8 mCi F-18 FDG was injected intravenously. Full-ring PET imaging was performed from the skull base to thigh after the radiotracer. CT data was obtained and used for attenuation correction and anatomic localization. Fasting blood glucose: 100 mg/dl COMPARISON:  CT chest 90/75/7974, PET 03/10/2020. CT abdomen pelvis 01/13/2022. FINDINGS: Mediastinal blood pool activity: SUV max 2.5 Liver activity: SUV max NA NECK: No abnormal hypermetabolism. Incidental CT findings: None. CHEST: Borderline hypermetabolic small mediastinal lymph nodes with index low right paratracheal lymph node measuring 6 mm, SUV max 2.5. Extensive hypermetabolic perilymphatic nodularity with nodular masslike areas of consolidation bilaterally, measuring up to 1.8 x 3.0 cm in the superior segment right lower lobe (6/54), SUV max 18.0. Incidental CT findings: Heart is enlarged.  No pericardial or pleural effusion. ABDOMEN/PELVIS: Hypermetabolic abdominal retroperitoneal and small bowel mesenteric adenopathy. Index nodal mass in the small bowel mesentery measures 3.3 x 4.7 cm (6/110), SUV max 6.0. Focal uptake in the proximal ascending colon, without a CT correlate. Incidental CT findings: Kidneys are non rotated.  Large hiatal hernia. SKELETON: No abnormal hypermetabolism. Incidental CT findings: Degenerative changes in the spine. IMPRESSION:  1. Extensive hypermetabolic perilymphatic nodularity with nodular and masslike areas of consolidation in the  lungs as well as borderline hypermetabolic mediastinal lymph nodes and hypermetabolic abdominal retroperitoneal and mesenteric lymph nodes, findings worrisome for lymphoma. 2. Focal hypermetabolism in the proximal ascending colon, without a definitive CT correlate. Please correlate with recent colonoscopy. 3. Large hiatal hernia. Electronically Signed   By: Newell Eke M.D.   On: 04/18/2024 09:59   DG Chest Port 1 View Result Date: 04/11/2024 CLINICAL DATA:  Post bronchoscopy. EXAM: PORTABLE CHEST 1 VIEW COMPARISON:  Chest CT 04/09/2024, chest x-ray 05/16/2018 FINDINGS: Lungs are hypoinflated demonstrate mild diffuse coarse interstitial changes compatible recent CT findings. No lobar consolidation or effusion. No pneumothorax post bronchoscopy. Cardiomediastinal silhouette is within normal. Degenerative change of the spine. Remainder of the exam is unremarkable. IMPRESSION: Hypoinflation with mild diffuse coarse interstitial changes compatible with recent CT findings. No pneumothorax post bronchoscopy. Electronically Signed   By: Toribio Agreste M.D.   On: 04/11/2024 09:47   DG C-Arm 1-60 Min-No Report Result Date: 04/11/2024 Fluoroscopy was utilized by the requesting physician.  No radiographic interpretation.   CT SUPER D CHEST WO MONARCH PILOT Result Date: 04/09/2024 CLINICAL DATA:  Lung nodules, melanoma, mediastinal adenopathy. EXAM: CT CHEST WITHOUT CONTRAST TECHNIQUE: Multidetector CT imaging of the chest was performed using thin slice collimation for electromagnetic bronchoscopy planning purposes, without intravenous contrast. RADIATION DOSE REDUCTION: This exam was performed according to the departmental dose-optimization program which includes automated exposure control, adjustment of the mA and/or kV according to patient size and/or use of iterative reconstruction technique. COMPARISON:  03/28/2024, 11/16/2022 and PET 03/10/2020. FINDINGS: Cardiovascular: Enlarged pulmonic trunk and heart. No  pericardial effusion. Mediastinum/Nodes: Thoracic inlet lymph nodes are not enlarged by CT size criteria. Mediastinal lymph nodes measure up to 11 mm in the low right paratracheal station. Hilar regions are difficult to definitively evaluate without IV contrast. No axillary adenopathy. Esophagus is grossly unremarkable. Lungs/Pleura: Diffuse perilymphatic nodularity with areas of nodular and masslike consolidation in the lower lobes, as on 03/28/2024 but new from 11/16/2022. No pleural fluid. Airway is unremarkable. Upper Abdomen: Moderate hiatal hernia. Visualized portions of the liver, adrenal glands, kidneys, spleen, pancreas, stomach and bowel are otherwise grossly unremarkable. No upper abdominal adenopathy. Musculoskeletal: Degenerative changes in the spine. IMPRESSION: 1. Extensive perilymphatic nodularity with areas of nodular and masslike consolidation in both lower lobes, as on 03/28/2024 but new from 11/16/2022. Findings are suspicious for sarcoid. However, lymphangitic carcinomatosis, in this patient with a history of melanoma, is also considered. 2. Moderate hiatal hernia. 3. Enlarged pulmonic trunk, indicative of pulmonary arterial hypertension. Electronically Signed   By: Newell Eke M.D.   On: 04/09/2024 15:27    Labs:  CBC: Recent Labs    02/25/24 1501 03/25/24 1058 05/07/24 0857  WBC 6.2 6.3 5.0  HGB 10.1* 11.1* 11.2*  HCT 32.5* 36.1 35.5*  PLT 269 247 272    COAGS: Recent Labs    05/07/24 0857  INR 0.9    BMP: Recent Labs    07/24/23 1539 02/25/24 1501  NA 142 138  K 3.8 4.0  CL 106 106  CO2 26 23  GLUCOSE 149* 89  BUN 21 24*  CALCIUM 9.0 9.0  CREATININE 1.26* 1.18*  GFRNONAA  --  52*    LIVER FUNCTION TESTS: Recent Labs    02/25/24 1501  BILITOT 0.5  AST 22  ALT 12  ALKPHOS 71  PROT 6.8  ALBUMIN 4.0    TUMOR MARKERS: No  results for input(s): AFPTM, CEA, CA199, CHROMGRNA in the last 8760 hours.  Assessment and Plan: Patient with  past medical history of anemia, melanoma presents with complaint of widespread abdominopelvic lymphadenopathy as well as lung mass.  IR consulted for bone marrow biopsy at the request of Dr. Rennie. Case reviewed by Dr. Jenna who approves patient for procedure.  Patient presents today in their usual state of health.  She has been NPO and is not currently on blood thinners.   Message sent to Dr. Damaris office re: O2 sats today.  As long as patient is breathing well without shortness of breath with O2 sats per her usual will plan to discharge home for follow up with PCP vs. Med Onc as needed.  Risks and benefits of bone marrow biopsy as well as lymph node biopsy was discussed with the patient and/or patient's family including, but not limited to bleeding, infection, damage to adjacent structures or low yield requiring additional tests.  All of the questions were answered and there is agreement to proceed.  Consent signed and in chart.   Thank you for this interesting consult.  I greatly enjoyed meeting Kaylee Gordon and look forward to participating in their care.  A copy of this report was sent to the requesting provider on this date.  Electronically Signed: Ula Couvillon Sue-Ellen Aboubacar Matsuo, PA 05/07/2024, 9:46 AM   I spent a total of  30 Minutes   in face to face in clinical consultation, greater than 50% of which was counseling/coordinating care for lymphadenopathy.

## 2024-05-14 ENCOUNTER — Encounter: Payer: Self-pay | Admitting: *Deleted

## 2024-05-14 ENCOUNTER — Encounter: Payer: Self-pay | Admitting: Oncology

## 2024-05-14 ENCOUNTER — Inpatient Hospital Stay: Admitting: Oncology

## 2024-05-14 VITALS — BP 113/65 | HR 80 | Temp 98.6°F | Resp 16 | Wt 156.0 lb

## 2024-05-14 DIAGNOSIS — D47Z9 Other specified neoplasms of uncertain behavior of lymphoid, hematopoietic and related tissue: Secondary | ICD-10-CM

## 2024-05-14 LAB — SURGICAL PATHOLOGY

## 2024-05-14 NOTE — Progress Notes (Signed)
 Met with patient during follow up visit with Dr. Melanee. All questions answered during visit. Informed that will be in touch next week to schedule further appts for treatment. Instructed pt to call with any questions or needs. Pt verbalized understanding.

## 2024-05-15 NOTE — Progress Notes (Unsigned)
 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 Mardella in the past for iron  deficiency anemia.  Over the last 2 to 3 months patient had been having exertional shortness of breath and cough which led to further evaluation by pulmonary.  She had CT angio chest for pulmonary embolism which did not show any PE but showed widespread diffuse reticular nodular densities and peribronchovascular nodularity and miliary pattern of pulmonary nodules.  Infection versus neoplastic.    PET CT scan on 04/18/2024 showed extensive hypermetabolic perilymphatic nodularity with nodular and masslike areas of consolidation in the lungs as well as borderline hypermetabolic mediastinal lymph nodes and hypermetabolic abdominal retroperitoneal and mesenteric lymph nodes concerning for lymphoma.    Patient underwent bronchoscopy with biopsies.  Cytology showed predominant scant lymphocytes positive for malignancy.  Left lower lobe lung biopsy showed atypical lymphoid infiltrate containing a mix of CD20 positive B cells and CD3 positive T cells.  CD10 negative.  Bcl-2 positive and BCL6 partially positive.  Patient then underwent bone marrow biopsy on 05/07/2024 which showed hypocellular bone marrow with orderly trilineage hematopoiesis and few scattered lymphoid aggregates consistent with involvement of low-grade B-cell lymphoproliferative  disorder.  Patient also underwent soft tissue biopsy of the retroperitoneal mass which was consistent with low-grade B-cell lymphoma with nonspecific phenotype.  Large cells not identified.  Immunohistochemistry positive for CD20 and negative for CD10, BCL6, CD5, cyclin D1 and CD43.  Ki-67 less than 5%.  Morphologic and immunophenotypic findings are consistent with low-grade B-cell lymphoma with nonspecific immunophenotype.  2 main differentials include marginal zone lymphoma and lymphoplasmacytic lymphoma.  MYD 88 currently pending.  Of note patient found to have 0.1 g IgM kappa light chain spike on myeloma panel and serum free kappa light chain mildly elevated at 57 with a ratio of 7.53.   Interval history-patient is anxious about the next steps after her diagnosis.  She has myalgias from her underlying fibromyalgia.  She has ongoing chronic cough and fatigue  ECOG PS- 1 Pain scale- 0   Review of systems- Review of Systems  Constitutional:  Positive for malaise/fatigue. Negative for chills, fever and weight loss.  HENT:  Negative for congestion, ear discharge and nosebleeds.   Eyes:  Negative for blurred vision.  Respiratory:  Positive for cough. Negative for hemoptysis, sputum production, shortness of breath and wheezing.   Cardiovascular:  Negative for chest pain, palpitations, orthopnea and claudication.  Gastrointestinal:  Negative for abdominal pain, blood in stool, constipation, diarrhea, heartburn, melena, nausea and vomiting.  Genitourinary:  Negative for dysuria, flank pain, frequency, hematuria and urgency.  Musculoskeletal:  Positive for myalgias. Negative for back pain and joint pain.  Skin:  Negative for rash.  Neurological:  Negative for dizziness, tingling, focal weakness, seizures, weakness and headaches.  Endo/Heme/Allergies:  Does not bruise/bleed easily.  Psychiatric/Behavioral:  Negative for depression and suicidal ideas. The patient does not have insomnia.        Allergies  Allergen Reactions   Chlorzoxazone Anaphylaxis and Hives   Levofloxacin  Other (See Comments) and Nausea Only    Lethargic, Flu-like symptoms, Hot flashes    Atorvastatin    Hydrochlorothiazide Other (See Comments)    Intolerance - dry lips   Pollen Extract    Pseudoephedrine Other (See Comments)   Sudafed [Pseudoephedrine Hcl]     Heart racing     Past Medical History:  Diagnosis Date   Anxiety    Asthma    B12 deficiency    Bipolar disorder (HCC)    Borderline diabetes    CHF (congestive heart failure) (HCC)    Colon polyps    Depression    Dyspnea    Family history of adverse reaction to anesthesia    mom and dad-delayed emergence   Fibromyalgia    Hilar adenopathy    History of hiatal hernia    History of melanoma    History of methicillin resistant staphylococcus aureus (MRSA) 2010   Hypertension    Hypoparathyroidism    Hypothyroidism    IDA (iron  deficiency anemia)    Lung nodules    Mediastinal adenopathy    PONV (postoperative nausea and vomiting)    delayed emergence   Prolonged Q-T interval on ECG    Thyroid  disease      Past Surgical History:  Procedure Laterality Date   ANKLE SURGERY Right 2012   APPENDECTOMY     AUGMENTATION MAMMAPLASTY Bilateral    CESAREAN SECTION     x2   COLONOSCOPY WITH PROPOFOL  N/A 02/22/2015   Procedure: COLONOSCOPY WITH PROPOFOL ;  Surgeon: Donnice Vaughn Manes, MD;  Location: [facility] ENDOSCOPY;  Service: Endoscopy;  Laterality: N/A;   COLONOSCOPY WITH PROPOFOL  N/A 08/08/2022   Procedure: COLONOSCOPY WITH PROPOFOL ;  Surgeon: Maryruth Ole DASEN, MD;  Location: [facility] ENDOSCOPY;  Service: Endoscopy;  Laterality: N/A;   ESOPHAGOGASTRODUODENOSCOPY (EGD) WITH PROPOFOL  N/A 02/22/2015   Procedure: ESOPHAGOGASTRODUODENOSCOPY (EGD) WITH PROPOFOL ;  Surgeon: Donnice Vaughn Manes, MD;  Location: [facility] ENDOSCOPY;  Service: Endoscopy;  Laterality: N/A;   ESOPHAGOGASTRODUODENOSCOPY (EGD) WITH PROPOFOL  N/A 02/11/2016    Procedure: ESOPHAGOGASTRODUODENOSCOPY (EGD) WITH PROPOFOL ;  Surgeon: Lamar DASEN Holmes, MD;  Location: [facility] ENDOSCOPY;  Service: Endoscopy;  Laterality: N/A;   ESOPHAGOGASTRODUODENOSCOPY (EGD) WITH PROPOFOL  N/A 08/08/2022   Procedure: ESOPHAGOGASTRODUODENOSCOPY (EGD) WITH PROPOFOL ;  Surgeon: Maryruth Ole DASEN, MD;  Location: [facility] ENDOSCOPY;  Service: Endoscopy;  Laterality: N/A;   HEMORRHOID SURGERY     NASAL SINUS SURGERY     SAVORY DILATION N/A 02/22/2015   Procedure: SAVORY DILATION;  Surgeon: Donnice Vaughn Manes, MD;  Location: [facility] ENDOSCOPY;  Service: Endoscopy;  Laterality: N/A;   SAVORY DILATION N/A 02/11/2016   Procedure: SAVORY DILATION;  Surgeon: Lamar DASEN Holmes, MD;  Location: [facility] ENDOSCOPY;  Service: Endoscopy;  Laterality: N/A;   SKIN CANCER EXCISION     VIDEO BRONCHOSCOPY WITH ENDOBRONCHIAL NAVIGATION N/A 04/11/2024   Procedure: VIDEO BRONCHOSCOPY WITH ENDOBRONCHIAL NAVIGATION;  Surgeon: Parris Manna, MD;  Location: [facility] ORS;  Service: Thoracic;  Laterality: N/A;   VIDEO BRONCHOSCOPY WITH ENDOBRONCHIAL ULTRASOUND N/A 04/11/2024   Procedure: BRONCHOSCOPY, WITH EBUS;  Surgeon: Parris Manna, MD;  Location: [facility] ORS;  Service: Thoracic;  Laterality: N/A;    Social History   Socioeconomic History   Marital status: Married    Spouse name: Ozell   Number of children: Not on file   Years of education: Not on file   Highest education level: Not on file  Occupational History   Not on file  Tobacco Use   Smoking status: Never   Smokeless tobacco:  Never  Vaping Use   Vaping status: Never Used  Substance and Sexual Activity   Alcohol use: Yes    Comment: rare wine   Drug use: No   Sexual activity: Not Currently    Partners: Male  Other Topics Concern   Not on file  Social History Narrative   Lives in [place]; taught high school- math; retd; no smoking; social alcohol.    Social Drivers of Corporate Investment Banker Strain: Low Risk  (08/07/2023)   Received  from [facility]   Overall Financial Resource Strain (CARDIA)    Difficulty of Paying Living Expenses: Not hard at all  Food Insecurity: No Food Insecurity (02/25/2024)   Hunger Vital Sign    Worried About Running Out of Food in the Last Year: Never true    Ran Out of Food in the Last Year: Never true  Transportation Needs: No Transportation Needs (02/25/2024)   PRAPARE - Administrator, Civil Service (Medical): No    Lack of Transportation (Non-Medical): No  Physical Activity: Unknown (05/16/2018)   Exercise Vital Sign    Days of Exercise per Week: 0 days    Minutes of Exercise per Session: Not on file  Stress: Stress Concern Present (05/16/2018)   [employer] of Occupational Health - Occupational Stress Questionnaire    Feeling of Stress : Very much  Social Connections: Unknown (05/16/2018)   Social Connection and Isolation Panel    Frequency of Communication with Friends and Family: Twice a week    Frequency of Social Gatherings with Friends and Family: Twice a week    Attends Religious Services: More than 4 times per year    Active Member of [employer] or Organizations: Not on file    Attends Banker Meetings: Not on file    Marital Status: Married  Intimate Partner Violence: Not At Risk (02/25/2024)   Humiliation, Afraid, Rape, and Kick questionnaire    Fear of Current or Ex-Partner: No    Emotionally Abused: No    Physically Abused: No    Sexually Abused: No    Family History  Problem Relation Age of Onset   Cancer Mother        colon cancer   Depression Mother    Anxiety disorder Mother    Heart disease Father    Cancer Father        ? neck cancer- still living.    Anxiety disorder Father    Depression Father    Colon cancer Maternal Aunt        died in 46s.    Hypoparathyroidism Son      Current Outpatient Medications:    busPIRone  (BUSPAR ) 10 MG tablet, Take 4 tablets (40 mg total) by mouth as directed. TAKE 1 TABLET  BY MOUTH 3 TIMES A DAY AND 1 EXTRA TABLET AS NEEDED FOR SEVERE ANXIETY, Disp: 120 tablet, Rfl: 2   calcitRIOL  (ROCALTROL ) 0.25 MCG capsule, Take 1 capsule (0.25 mcg total) by mouth daily., Disp: 90 capsule, Rfl: 3   escitalopram  (LEXAPRO ) 10 MG tablet, Take 1.5 tablets (15 mg total) by mouth daily. (Patient taking differently: Take 15 mg by mouth every morning.), Disp: 135 tablet, Rfl: 0   esomeprazole (NEXIUM) 40 MG capsule, Take 1 capsule by mouth every morning., Disp: , Rfl:    fenofibrate 160 MG tablet, Take 1 tablet by mouth daily., Disp: , Rfl:    fluticasone-salmeterol (ADVAIR) 250-50 MCG/ACT AEPB, Inhale 1 puff into the lungs  in the morning and at bedtime., Disp: , Rfl:    gabapentin  (NEURONTIN ) 300 MG capsule, Take 300 mg by mouth See admin instructions. 2 CAP AM, 1 CAP Q noon, 2 CAP pm,, Disp: , Rfl:    ipratropium (ATROVENT ) 0.03 % nasal spray, Place 1 spray into both nostrils 2 (two) times daily., Disp: , Rfl:    isosorbide mononitrate (IMDUR) 30 MG 24 hr tablet, Take 30 mg by mouth 2 (two) times daily., Disp: , Rfl:    lamoTRIgine  (LAMICTAL ) 200 MG tablet, Take 0.5 tablets (100 mg total) by mouth 2 (two) times daily., Disp: 90 tablet, Rfl: 1   levothyroxine  (SYNTHROID ) 75 MCG tablet, Take 1 tablet (75 mcg total) by mouth daily. (Patient taking differently: Take 75 mcg by mouth daily before breakfast.), Disp: 90 tablet, Rfl: 3   lisinopril (ZESTRIL) 10 MG tablet, Take 10 mg by mouth daily., Disp: , Rfl:    lovastatin (MEVACOR) 20 MG tablet, Take 20 mg by mouth at bedtime., Disp: , Rfl:    montelukast (SINGULAIR) 10 MG tablet, Take 10 mg by mouth at bedtime., Disp: , Rfl:    ondansetron  (ZOFRAN ) 8 MG tablet, Take 1 tablet (8 mg total) by mouth every 8 (eight) hours as needed for nausea or vomiting (nausea related to iron  infusions)., Disp: 60 tablet, Rfl: 0   prazosin  (MINIPRESS ) 2 MG capsule, Take 1 capsule (2 mg total) by mouth at bedtime. (Patient taking differently: Take 2 mg by mouth  at bedtime. For Nightmares), Disp: 90 capsule, Rfl: 1   propranolol ER (INDERAL LA) 80 MG 24 hr capsule, Take 80 mg by mouth every morning., Disp: , Rfl:    RESTASIS 0.05 % ophthalmic emulsion, Place 1 drop into both eyes 2 (two) times daily., Disp: , Rfl:    rOPINIRole  (REQUIP ) 0.25 MG tablet, TAKE 1 TABLET BY MOUTH 3 TIMES A DAY, Disp: 90 tablet, Rfl: 2   temazepam  (RESTORIL ) 30 MG capsule, Take 1 capsule (30 mg total) by mouth at bedtime., Disp: 30 capsule, Rfl: 2   TRYPTYR 0.003 % SOLN, Apply 1 drop to eye 2 (two) times daily., Disp: , Rfl:   Physical exam:  Vitals:   05/14/24 1413  BP: 113/65  Pulse: 80  Resp: 16  Temp: 98.6 F (37 C)  TempSrc: Tympanic  SpO2: 95%  Weight: 156 lb (70.8 kg)   Physical Exam Cardiovascular:     Rate and Rhythm: Normal rate and regular rhythm.     Heart sounds: Normal heart sounds.  Pulmonary:     Effort: Pulmonary effort is normal.     Breath sounds: Normal breath sounds.  Skin:    General: Skin is warm and dry.  Neurological:     Mental Status: She is alert and oriented to person, place, and time.      I have personally reviewed labs listed below:    Latest Ref Rng & Units 02/25/2024    3:01 PM  CMP  Glucose 70 - 99 mg/dL 89   BUN 8 - 23 mg/dL 24   Creatinine 9.55 - 1.00 mg/dL 8.81   Sodium 864 - 854 mmol/L 138   Potassium 3.5 - 5.1 mmol/L 4.0   Chloride 98 - 111 mmol/L 106   CO2 22 - 32 mmol/L 23   Calcium 8.9 - 10.3 mg/dL 9.0   Total Protein 6.5 - 8.1 g/dL 6.8   Total Bilirubin 0.0 - 1.2 mg/dL 0.5   Alkaline Phos 38 - 126 U/L 71   AST  15 - 41 U/L 22   ALT 0 - 44 U/L 12       Latest Ref Rng & Units 05/07/2024    8:57 AM  CBC  WBC 4.0 - 10.5 K/uL 5.0   Hemoglobin 12.0 - 15.0 g/dL 88.7   Hematocrit 63.9 - 46.0 % 35.5   Platelets 150 - 400 K/uL 272    I have personally reviewed Radiology images listed below: No images are attached to the encounter.  CT ABDOMINAL MASS BIOPSY Result Date: 05/08/2024 INDICATION: Central  abdominal mass. EXAM: CT-guided biopsy TECHNIQUE: Multidetector CT imaging of the abdomen was performed following the standard protocol without IV contrast. RADIATION DOSE REDUCTION: This exam was performed according to the departmental dose-optimization program which includes automated exposure control, adjustment of the mA and/or kV according to patient size and/or use of iterative reconstruction technique. MEDICATIONS: None. ANESTHESIA/SEDATION: Moderate (conscious) sedation was employed during this procedure. A total of Versed  1.5 mg and Fentanyl  75 mcg was administered intravenously by the radiology nurse. Total intra-service moderate Sedation Time: 57 minutes. The patient's level of consciousness and vital signs were monitored continuously by radiology nursing throughout the procedure under my direct supervision. COMPLICATIONS: None immediate. PROCEDURE: Informed written consent was obtained from the patient after a thorough discussion of the procedural risks, benefits and alternatives. All questions were addressed. Maximal Sterile Barrier Technique was utilized including caps, mask, sterile gowns, sterile gloves, sterile drape, hand hygiene and skin antiseptic. A timeout was performed prior to the initiation of the procedure. In a supine position radiopaque markers were placed on the patient's skin and axial images were obtained. The central mass is again identified. The patient's skin was marked, prepped, and draped in the usual sterile fashion. A 17 gauge introducer was advanced through a small incision near the umbilicus and advanced into the peritoneal cavity. Position was suboptimal. The needle was withdrawn and readvanced into the lesion in a more satisfactory location. Once the needle was verified by reimaging to be within the lesion, 2 core samples were obtained. After the core samples were obtained, the lesion was hemorrhaging and therefore Gel-Foam was injected into the introducer. Final image was  obtained demonstrating no hemorrhage. Sterile dressing was applied. IMPRESSION: Satisfactory core needle biopsy of a central abdominal mass. Electronically Signed   By: Cordella Banner   On: 05/08/2024 08:55   CT BONE MARROW BIOPSY & ASPIRATION Result Date: 05/08/2024 CLINICAL DATA:  Iron -deficiency anemia. EXAM: CT-guided bone marrow biopsy TECHNIQUE: CT pelvis CONTRAST:  None RADIOPHARMACEUTICALS:  None COMPARISON:  None FINDINGS: The patient was placed in prone position on the CT gantry. Radiopaque markers were placed on the patient's skin and initial imaging of the pelvis was performed. The patient's skin was then prepped and draped in the usual sterile fashion. Moderate sedation was provided for by the nursing staff under my supervision utilizing intravenous Versed  and fentanyl . The nurse had no other duties other than monitoring the patient and providing sedation during the procedure. I was present for the entire procedure. 1.5 mg intravenous Versed  and 75 mcg of intravenous fentanyl  for a total sedation time of 57 minutes. 1% lidocaine  was used to infiltrate the skin at the access site prior to a stab incision. Local anesthesia was then used to infiltrate the region of soft tissue from the skin to the right iliac bone. The bone marrow needle was then advanced and imaging demonstrated the needle tip to be in the cortex of the right iliac bone. The bone was then  penetrated and a sample was obtained. After the sample was evaluated, approximately 5 mL of heparinized bone marrow sample was obtained by aspiration. A core sample was then obtained. Multiple attempts at sampling was performed in order to get 2 1 cm segments. All needles were then removed from the patient. Sterile dressing was applied. IMPRESSION: Satisfactory core needle biopsy and aspiration of the right iliac bone marrow under CT guidance. Electronically Signed   By: Cordella Banner   On: 05/08/2024 08:14   NM PET Image Initial (PI) Skull  Base To Thigh (F-18 FDG) Result Date: 04/18/2024 CLINICAL DATA:  Subsequent treatment strategy for lung nodules. EXAM: NUCLEAR MEDICINE PET SKULL BASE TO THIGH TECHNIQUE: 8.8 mCi F-18 FDG was injected intravenously. Full-ring PET imaging was performed from the skull base to thigh after the radiotracer. CT data was obtained and used for attenuation correction and anatomic localization. Fasting blood glucose: 100 mg/dl COMPARISON:  CT chest 90/75/7974, PET 03/10/2020. CT abdomen pelvis 01/13/2022. FINDINGS: Mediastinal blood pool activity: SUV max 2.5 Liver activity: SUV max NA NECK: No abnormal hypermetabolism. Incidental CT findings: None. CHEST: Borderline hypermetabolic small mediastinal lymph nodes with index low right paratracheal lymph node measuring 6 mm, SUV max 2.5. Extensive hypermetabolic perilymphatic nodularity with nodular masslike areas of consolidation bilaterally, measuring up to 1.8 x 3.0 cm in the superior segment right lower lobe (6/54), SUV max 18.0. Incidental CT findings: Heart is enlarged.  No pericardial or pleural effusion. ABDOMEN/PELVIS: Hypermetabolic abdominal retroperitoneal and small bowel mesenteric adenopathy. Index nodal mass in the small bowel mesentery measures 3.3 x 4.7 cm (6/110), SUV max 6.0. Focal uptake in the proximal ascending colon, without a CT correlate. Incidental CT findings: Kidneys are non rotated.  Large hiatal hernia. SKELETON: No abnormal hypermetabolism. Incidental CT findings: Degenerative changes in the spine. IMPRESSION: 1. Extensive hypermetabolic perilymphatic nodularity with nodular and masslike areas of consolidation in the lungs as well as borderline hypermetabolic mediastinal lymph nodes and hypermetabolic abdominal retroperitoneal and mesenteric lymph nodes, findings worrisome for lymphoma. 2. Focal hypermetabolism in the proximal ascending colon, without a definitive CT correlate. Please correlate with recent colonoscopy. 3. Large hiatal hernia.  Electronically Signed   By: Newell Eke M.D.   On: 04/18/2024 09:59     Assessment and plan- Patient is a 63 y.o. female diagnosed with low-grade B-cell lymphoproliferative disorder  I have reviewed PET CT scan images independently and I have discussed the results of bone marrow biopsy and abdominal mass biopsy with the patient and her husband.  Based on lung biopsy as well as both the biopsies about the overall clinical picture is consistent with low-grade B-cell lymphoproliferative disorder with nonspecific immunophenotype.  Main differential diagnosis is presently marginal zone lymphoma versus lymphoplasmacytic lymphoma.  MYD 88 is pending and if positive we will more likely be in the direction of lymphoplasmacytic lymphoma.  Regardless given the perilymphatic nodularity in the lungs as well as large retroperitoneal mass I do think that treatment is warranted over watchful monitoring.   Visit Diagnosis 1. 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 Annah Skene, MD, MPH [facility] at [facility] [phone] 05/15/2024 12:00 PM

## 2024-05-16 ENCOUNTER — Encounter (HOSPITAL_COMMUNITY): Payer: Self-pay | Admitting: Internal Medicine

## 2024-05-18 ENCOUNTER — Telehealth: Payer: Self-pay | Admitting: Internal Medicine

## 2024-05-18 NOTE — Telephone Encounter (Signed)
 Please Move the pt from Lauren to my schedule on 11/06- no labs; cancel venofer .   GB

## 2024-05-19 ENCOUNTER — Encounter: Payer: Self-pay | Admitting: *Deleted

## 2024-05-19 DIAGNOSIS — D47Z9 Other specified neoplasms of uncertain behavior of lymphoid, hematopoietic and related tissue: Secondary | ICD-10-CM

## 2024-05-19 NOTE — Progress Notes (Signed)
 Received call from pt requesting appts for port placement and chemo education while awaiting further follow up with Dr. KATHEE. Spoke to Dr. B who is in agreement with scheduling appts. Orders placed and will call pt with appt once scheduled.

## 2024-05-21 ENCOUNTER — Inpatient Hospital Stay

## 2024-05-21 ENCOUNTER — Other Ambulatory Visit: Payer: Self-pay

## 2024-05-21 NOTE — H&P (Signed)
 Chief Complaint: Patient was seen in consultation today for low grade B cell lymphoproliferative disorder with consideration for Port-A-Cath placement for chemotherapy administration.  Referring Provider(s): Dr. Cindy Joe, MD   Supervising Physician: Luverne Aran  Patient Status: [facility] - Out-pt  Patient is Full Code  History of Present Illness: Kaylee Gordon is a 63 y.o. female  with PMHx notable for prediabetes, CHF, HTN, hypothyroidism, hypoparathyroidism, anxiety, bipolar disorder, fibromyalgia, melanoma, iron  deficiency anemia, and others as delineated below.  Patient is know to IR service, having most recently undergone abdominal mass and bone marrow biopsies on 05/07/24 by Dr. Jenna.  Per Dr. Darold progress note on 10/29: Over the last 2 to 3 months patient had been having exertional shortness of breath and cough which led to further evaluation by pulmonary.  She had CT angio chest for pulmonary embolism which did not show any PE but showed widespread diffuse reticular nodular densities and peribronchovascular nodularity and miliary pattern of pulmonary nodules.  Infection versus neoplastic.     PET CT scan on 04/18/2024 showed extensive hypermetabolic perilymphatic nodularity with nodular and masslike areas of consolidation in the lungs as well as borderline hypermetabolic mediastinal lymph nodes and hypermetabolic abdominal retroperitoneal and mesenteric lymph nodes concerning for lymphoma.     Patient underwent bronchoscopy with biopsies.  Cytology showed predominant scant lymphocytes positive for malignancy.  Left lower lobe lung biopsy showed atypical lymphoid infiltrate [...].   Patient then underwent bone marrow biopsy on 05/07/2024 which showed hypocellular bone marrow with orderly trilineage hematopoiesis and few scattered lymphoid aggregates consistent with involvement of low-grade B-cell lymphoproliferative disorder.   Patient also underwent soft  tissue biopsy of the retroperitoneal mass which was consistent with low-grade B-cell lymphoma with nonspecific phenotype.  [...] Morphologic and immunophenotypic findings are consistent with low-grade B-cell lymphoma with nonspecific immunophenotype [...].  Patient is recommended for chemotherapy administration, and is in agreement with plan.   Interventional Radiology was requested for Port-A-Cath placement. Patient is scheduled for same in IR today.   Patient is alert and laying in bed, calm.  Patient is currently without any significant complaints.  Patient denies any fevers, headache, chest pain, SOB, cough, abdominal pain, nausea, vomiting or bleeding.    Past Medical History:  Diagnosis Date   Anxiety    Asthma    B12 deficiency    Bipolar disorder (HCC)    Borderline diabetes    CHF (congestive heart failure) (HCC)    Colon polyps    Depression    Dyspnea    Family history of adverse reaction to anesthesia    mom and dad-delayed emergence   Fibromyalgia    Hilar adenopathy    History of hiatal hernia    History of melanoma    History of methicillin resistant staphylococcus aureus (MRSA) 2010   Hypertension    Hypoparathyroidism    Hypothyroidism    IDA (iron  deficiency anemia)    Lung nodules    Mediastinal adenopathy    PONV (postoperative nausea and vomiting)    delayed emergence   Prolonged Q-T interval on ECG    Thyroid  disease     Past Surgical History:  Procedure Laterality Date   ANKLE SURGERY Right 2012   APPENDECTOMY     AUGMENTATION MAMMAPLASTY Bilateral    CESAREAN SECTION     x2   COLONOSCOPY WITH PROPOFOL  N/A 02/22/2015   Procedure: COLONOSCOPY WITH PROPOFOL ;  Surgeon: Donnice Vaughn Manes, MD;  Location: [facility] ENDOSCOPY;  Service: Endoscopy;  Laterality: N/A;   COLONOSCOPY WITH PROPOFOL  N/A 08/08/2022   Procedure: COLONOSCOPY WITH PROPOFOL ;  Surgeon: Maryruth Ole DASEN, MD;  Location: [facility] ENDOSCOPY;  Service: Endoscopy;  Laterality: N/A;    ESOPHAGOGASTRODUODENOSCOPY (EGD) WITH PROPOFOL  N/A 02/22/2015   Procedure: ESOPHAGOGASTRODUODENOSCOPY (EGD) WITH PROPOFOL ;  Surgeon: Donnice Vaughn Manes, MD;  Location: [facility] ENDOSCOPY;  Service: Endoscopy;  Laterality: N/A;   ESOPHAGOGASTRODUODENOSCOPY (EGD) WITH PROPOFOL  N/A 02/11/2016   Procedure: ESOPHAGOGASTRODUODENOSCOPY (EGD) WITH PROPOFOL ;  Surgeon: Lamar DASEN Holmes, MD;  Location: [facility] ENDOSCOPY;  Service: Endoscopy;  Laterality: N/A;   ESOPHAGOGASTRODUODENOSCOPY (EGD) WITH PROPOFOL  N/A 08/08/2022   Procedure: ESOPHAGOGASTRODUODENOSCOPY (EGD) WITH PROPOFOL ;  Surgeon: Maryruth Ole DASEN, MD;  Location: [facility] ENDOSCOPY;  Service: Endoscopy;  Laterality: N/A;   HEMORRHOID SURGERY     NASAL SINUS SURGERY     SAVORY DILATION N/A 02/22/2015   Procedure: SAVORY DILATION;  Surgeon: Donnice Vaughn Manes, MD;  Location: [facility] ENDOSCOPY;  Service: Endoscopy;  Laterality: N/A;   SAVORY DILATION N/A 02/11/2016   Procedure: SAVORY DILATION;  Surgeon: Lamar DASEN Holmes, MD;  Location: [facility] ENDOSCOPY;  Service: Endoscopy;  Laterality: N/A;   SKIN CANCER EXCISION     VIDEO BRONCHOSCOPY WITH ENDOBRONCHIAL NAVIGATION N/A 04/11/2024   Procedure: VIDEO BRONCHOSCOPY WITH ENDOBRONCHIAL NAVIGATION;  Surgeon: Parris Manna, MD;  Location: [facility] ORS;  Service: Thoracic;  Laterality: N/A;   VIDEO BRONCHOSCOPY WITH ENDOBRONCHIAL ULTRASOUND N/A 04/11/2024   Procedure: BRONCHOSCOPY, WITH EBUS;  Surgeon: Parris Manna, MD;  Location: [facility] ORS;  Service: Thoracic;  Laterality: N/A;    Allergies: Chlorzoxazone, Levofloxacin, Atorvastatin, Hydrochlorothiazide, Pollen extract, and Pseudoephedrine  Medications: Prior to Admission medications   Medication Sig Start Date End Date Taking? Authorizing Provider  busPIRone  (BUSPAR ) 10 MG tablet Take 4 tablets (40 mg total) by mouth as directed. TAKE 1 TABLET BY MOUTH 3 TIMES A DAY AND 1 EXTRA TABLET AS NEEDED FOR SEVERE ANXIETY 03/18/24   Eappen, Saramma, MD  calcitRIOL   (ROCALTROL ) 0.25 MCG capsule Take 1 capsule (0.25 mcg total) by mouth daily. 07/25/23   Thapa, Sudan, MD  escitalopram  (LEXAPRO ) 10 MG tablet Take 1.5 tablets (15 mg total) by mouth daily. Patient taking differently: Take 15 mg by mouth every morning. 03/18/24   Eappen, Saramma, MD  esomeprazole (NEXIUM) 40 MG capsule Take 1 capsule by mouth every morning. 06/04/23   [provider]  fenofibrate 160 MG tablet Take 1 tablet by mouth daily. 12/17/20   [provider]  fluticasone-salmeterol (ADVAIR) 250-50 MCG/ACT AEPB Inhale 1 puff into the lungs in the morning and at bedtime. 03/21/24 03/21/25  [provider]  gabapentin  (NEURONTIN ) 300 MG capsule Take 300 mg by mouth See admin instructions. 2 CAP AM, 1 CAP Q noon, 2 CAP pm, 10/06/19   [provider]  ipratropium (ATROVENT ) 0.03 % nasal spray Place 1 spray into both nostrils 2 (two) times daily.    [provider]  isosorbide mononitrate (IMDUR) 30 MG 24 hr tablet Take 30 mg by mouth 2 (two) times daily. 02/06/23   [provider]  lamoTRIgine  (LAMICTAL ) 200 MG tablet Take 0.5 tablets (100 mg total) by mouth 2 (two) times daily. 02/12/24   Eappen, Saramma, MD  levothyroxine  (SYNTHROID ) 75 MCG tablet Take 1 tablet (75 mcg total) by mouth daily. Patient taking differently: Take 75 mcg by mouth daily before breakfast. 02/14/24   Thapa, Sudan, MD  lisinopril (ZESTRIL) 10 MG tablet Take 10 mg by mouth daily. 05/26/19   [provider]  lovastatin (MEVACOR) 20 MG tablet  Take 20 mg by mouth at bedtime.    [provider]  montelukast (SINGULAIR) 10 MG tablet Take 10 mg by mouth at bedtime. 03/27/24 03/27/25  [provider]  ondansetron  (ZOFRAN ) 8 MG tablet Take 1 tablet (8 mg total) by mouth every 8 (eight) hours as needed for nausea or vomiting (nausea related to iron  infusions). 03/03/24   Brahmanday, Govinda R, MD  prazosin  (MINIPRESS ) 2 MG capsule Take 1 capsule (2 mg total) by mouth at  bedtime. Patient taking differently: Take 2 mg by mouth at bedtime. For Nightmares 01/02/24   Eappen, Saramma, MD  propranolol ER (INDERAL LA) 80 MG 24 hr capsule Take 80 mg by mouth every morning. 10/08/22   [provider]  RESTASIS 0.05 % ophthalmic emulsion Place 1 drop into both eyes 2 (two) times daily. 03/07/24   [provider]  rOPINIRole  (REQUIP ) 0.25 MG tablet TAKE 1 TABLET BY MOUTH 3 TIMES A DAY 04/06/24   Eappen, Saramma, MD  temazepam  (RESTORIL ) 30 MG capsule Take 1 capsule (30 mg total) by mouth at bedtime. 03/10/24 06/08/24  Eappen, Saramma, MD  TRYPTYR 0.003 % SOLN Apply 1 drop to eye 2 (two) times daily. 03/05/24   [provider]     Family History  Problem Relation Age of Onset   Cancer Mother        colon cancer   Depression Mother    Anxiety disorder Mother    Heart disease Father    Cancer Father        ? neck cancer- still living.    Anxiety disorder Father    Depression Father    Colon cancer Maternal Aunt        died in 69s.    Hypoparathyroidism Son     Social History   Socioeconomic History   Marital status: Married    Spouse name: Ozell   Number of children: Not on file   Years of education: Not on file   Highest education level: Not on file  Occupational History   Not on file  Tobacco Use   Smoking status: Never   Smokeless tobacco: Never  Vaping Use   Vaping status: Never Used  Substance and Sexual Activity   Alcohol use: Yes    Comment: rare wine   Drug use: No   Sexual activity: Not Currently    Partners: Male  Other Topics Concern   Not on file  Social History Narrative   Lives in [place]; taught high school- math; retd; no smoking; social alcohol.    Social Drivers of Corporate Investment Banker Strain: Low Risk  (08/07/2023)   Received [facility]   Overall Financial Resource Strain (CARDIA)    Difficulty of Paying Living Expenses: Not hard at all  Food Insecurity: No Food  Insecurity (02/25/2024)   Hunger Vital Sign    Worried About Running Out of Food in the Last Year: Never true    Ran Out of Food in the Last Year: Never true  Transportation Needs: No Transportation Needs (02/25/2024)   PRAPARE - Administrator, Civil Service (Medical): No    Lack of Transportation (Non-Medical): No  Physical Activity: Unknown (05/16/2018)   Exercise Vital Sign    Days of Exercise per Week: 0 days    Minutes of Exercise per Session: Not on file  Stress: Stress Concern Present (05/16/2018)   [employer] of Occupational Health - Occupational Stress Questionnaire    Feeling  of Stress : Very much  Social Connections: Unknown (05/16/2018)   Social Connection and Isolation Panel    Frequency of Communication with Friends and Family: Twice a week    Frequency of Social Gatherings with Friends and Family: Twice a week    Attends Religious Services: More than 4 times per year    Active Member of [employer] or Organizations: Not on file    Attends Banker Meetings: Not on file    Marital Status: Married     Review of Systems: A 12 point ROS discussed and pertinent positives are indicated in the HPI above.  All other systems are negative.  Vital Signs: BP 109/66   Pulse 74   Temp 98 F (36.7 C) (Temporal)   Resp 18   Ht 5' 4 (1.626 m)   Wt 153 lb 9.6 oz (69.7 kg)   SpO2 91%   BMI 26.37 kg/m   Advance Care Plan: The advanced care place/surrogate decision maker was discussed at the time of visit and the patient did not wish to discuss or was not able to name a surrogate decision maker or provide an advance care plan.  Physical Exam Constitutional:      General: She is not in acute distress.    Appearance: Normal appearance.  HENT:     Mouth/Throat:     Mouth: Mucous membranes are dry.  Cardiovascular:     Rate and Rhythm: Normal rate and regular rhythm.     Pulses: Normal pulses.     Heart sounds: Normal heart sounds. No murmur  heard. Pulmonary:     Effort: Pulmonary effort is normal.     Breath sounds: Normal breath sounds.  Musculoskeletal:        General: Normal range of motion.     Cervical back: Normal range of motion.  Skin:    General: Skin is warm and dry.  Neurological:     Mental Status: She is alert and oriented to person, place, and time.  Psychiatric:        Mood and Affect: Mood normal.        Behavior: Behavior normal.        Thought Content: Thought content normal.        Judgment: Judgment normal.     Imaging: CT ABDOMINAL MASS BIOPSY Result Date: 05/08/2024 INDICATION: Central abdominal mass. EXAM: CT-guided biopsy TECHNIQUE: Multidetector CT imaging of the abdomen was performed following the standard protocol without IV contrast. RADIATION DOSE REDUCTION: This exam was performed according to the departmental dose-optimization program which includes automated exposure control, adjustment of the mA and/or kV according to patient size and/or use of iterative reconstruction technique. MEDICATIONS: None. ANESTHESIA/SEDATION: Moderate (conscious) sedation was employed during this procedure. A total of Versed  1.5 mg and Fentanyl  75 mcg was administered intravenously by the radiology nurse. Total intra-service moderate Sedation Time: 57 minutes. The patient's level of consciousness and vital signs were monitored continuously by radiology nursing throughout the procedure under my direct supervision. COMPLICATIONS: None immediate. PROCEDURE: Informed written consent was obtained from the patient after a thorough discussion of the procedural risks, benefits and alternatives. All questions were addressed. Maximal Sterile Barrier Technique was utilized including caps, mask, sterile gowns, sterile gloves, sterile drape, hand hygiene and skin antiseptic. A timeout was performed prior to the initiation of the procedure. In a supine position radiopaque markers were placed on the patient's skin and axial images were  obtained. The central mass is again identified. The patient's skin  was marked, prepped, and draped in the usual sterile fashion. A 17 gauge introducer was advanced through a small incision near the umbilicus and advanced into the peritoneal cavity. Position was suboptimal. The needle was withdrawn and readvanced into the lesion in a more satisfactory location. Once the needle was verified by reimaging to be within the lesion, 2 core samples were obtained. After the core samples were obtained, the lesion was hemorrhaging and therefore Gel-Foam was injected into the introducer. Final image was obtained demonstrating no hemorrhage. Sterile dressing was applied. IMPRESSION: Satisfactory core needle biopsy of a central abdominal mass. Electronically Signed   By: Cordella Banner   On: 05/08/2024 08:55   CT BONE MARROW BIOPSY & ASPIRATION Result Date: 05/08/2024 CLINICAL DATA:  Iron -deficiency anemia. EXAM: CT-guided bone marrow biopsy TECHNIQUE: CT pelvis CONTRAST:  None RADIOPHARMACEUTICALS:  None COMPARISON:  None FINDINGS: The patient was placed in prone position on the CT gantry. Radiopaque markers were placed on the patient's skin and initial imaging of the pelvis was performed. The patient's skin was then prepped and draped in the usual sterile fashion. Moderate sedation was provided for by the nursing staff under my supervision utilizing intravenous Versed  and fentanyl . The nurse had no other duties other than monitoring the patient and providing sedation during the procedure. I was present for the entire procedure. 1.5 mg intravenous Versed  and 75 mcg of intravenous fentanyl  for a total sedation time of 57 minutes. 1% lidocaine  was used to infiltrate the skin at the access site prior to a stab incision. Local anesthesia was then used to infiltrate the region of soft tissue from the skin to the right iliac bone. The bone marrow needle was then advanced and imaging demonstrated the needle tip to be in the  cortex of the right iliac bone. The bone was then penetrated and a sample was obtained. After the sample was evaluated, approximately 5 mL of heparinized bone marrow sample was obtained by aspiration. A core sample was then obtained. Multiple attempts at sampling was performed in order to get 2 1 cm segments. All needles were then removed from the patient. Sterile dressing was applied. IMPRESSION: Satisfactory core needle biopsy and aspiration of the right iliac bone marrow under CT guidance. Electronically Signed   By: Cordella Banner   On: 05/08/2024 08:14    Labs:  CBC: Recent Labs    02/25/24 1501 03/25/24 1058 05/07/24 0857  WBC 6.2 6.3 5.0  HGB 10.1* 11.1* 11.2*  HCT 32.5* 36.1 35.5*  PLT 269 247 272    COAGS: Recent Labs    05/07/24 0857  INR 0.9    BMP: Recent Labs    07/24/23 1539 02/25/24 1501  NA 142 138  K 3.8 4.0  CL 106 106  CO2 26 23  GLUCOSE 149* 89  BUN 21 24*  CALCIUM 9.0 9.0  CREATININE 1.26* 1.18*  GFRNONAA  --  52*    LIVER FUNCTION TESTS: Recent Labs    02/25/24 1501  BILITOT 0.5  AST 22  ALT 12  ALKPHOS 71  PROT 6.8  ALBUMIN 4.0    TUMOR MARKERS: No results for input(s): AFPTM, CEA, CA199, CHROMGRNA in the last 8760 hours.  Assessment and Plan: Per Dr. Darold progress note on 10/29: Patient is a 63 y.o. female diagnosed with low-grade B-cell lymphoproliferative disorder [...].   [...] I would recommend combination chemoimmunotherapy with Bendamustine Rituxan or single agent Rituxan.  [...] Treatment will be given with a curative intent. Patient understands and  agrees to proceed as planned. Baseline hepatitis testing was negative. Patient is not keen on port placement but will likely require his BR regimen is chosen for her.  Patient presents for scheduled Port-A-Cath placement in IR today.  Patient has been NPO since midnight.  All labs and medications are within acceptable parameters.  No pertinent allergies.    Risks and benefits of image guided Port-A-Catheter placement was discussed with the patient including, but not limited to bleeding, infection, pneumothorax, or fibrin sheath development and need for additional procedures.  All of the patient's questions were answered, patient is agreeable to proceed. Consent signed and in chart.    Thank you for allowing our service to participate in [facility] Kaylee Gordon 's care.  Electronically Signed: Carlin DELENA Griffon, PA-C   05/22/2024, 1:24 PM      I spent a total of 30 Minutes in face to face in clinical consultation, greater than 50% of which was counseling/coordinating care for chemotherapy administration  with consideration for Port-A-Cath placement.

## 2024-05-21 NOTE — Progress Notes (Signed)
 Patient for IR Port Placement on Thurs 05/22/24, I called and spoke with the patient on the phone and gave pre-procedure instructions. Pt was made aware to be here at 12:30p, NPO at least 6 hours prior to procedure as well as driver post procedure/recovery/discharge. Pt stated understanding.  Called 05/21/24

## 2024-05-22 ENCOUNTER — Inpatient Hospital Stay

## 2024-05-22 ENCOUNTER — Ambulatory Visit
Admission: RE | Admit: 2024-05-22 | Discharge: 2024-05-22 | Disposition: A | Discharge status: Home / Self Care | Source: Ambulatory Visit | Attending: Internal Medicine | Admitting: Internal Medicine

## 2024-05-22 ENCOUNTER — Encounter: Payer: Self-pay | Admitting: Radiology

## 2024-05-22 ENCOUNTER — Inpatient Hospital Stay: Admitting: Nurse Practitioner

## 2024-05-22 ENCOUNTER — Inpatient Hospital Stay: Attending: Internal Medicine

## 2024-05-22 ENCOUNTER — Other Ambulatory Visit: Payer: Self-pay

## 2024-05-22 DIAGNOSIS — D47Z9 Other specified neoplasms of uncertain behavior of lymphoid, hematopoietic and related tissue: Secondary | ICD-10-CM

## 2024-05-22 DIAGNOSIS — I110 Hypertensive heart disease with heart failure: Secondary | ICD-10-CM | POA: Diagnosis not present

## 2024-05-22 DIAGNOSIS — F319 Bipolar disorder, unspecified: Secondary | ICD-10-CM | POA: Insufficient documentation

## 2024-05-22 DIAGNOSIS — D509 Iron deficiency anemia, unspecified: Secondary | ICD-10-CM | POA: Diagnosis not present

## 2024-05-22 DIAGNOSIS — E209 Hypoparathyroidism, unspecified: Secondary | ICD-10-CM | POA: Insufficient documentation

## 2024-05-22 DIAGNOSIS — F419 Anxiety disorder, unspecified: Secondary | ICD-10-CM | POA: Insufficient documentation

## 2024-05-22 DIAGNOSIS — Z79899 Other long term (current) drug therapy: Secondary | ICD-10-CM | POA: Diagnosis not present

## 2024-05-22 DIAGNOSIS — C9110 Chronic lymphocytic leukemia of B-cell type not having achieved remission: Secondary | ICD-10-CM | POA: Insufficient documentation

## 2024-05-22 DIAGNOSIS — E039 Hypothyroidism, unspecified: Secondary | ICD-10-CM | POA: Diagnosis not present

## 2024-05-22 DIAGNOSIS — M797 Fibromyalgia: Secondary | ICD-10-CM | POA: Diagnosis not present

## 2024-05-22 DIAGNOSIS — I509 Heart failure, unspecified: Secondary | ICD-10-CM | POA: Insufficient documentation

## 2024-05-22 DIAGNOSIS — Z5111 Encounter for antineoplastic chemotherapy: Secondary | ICD-10-CM | POA: Insufficient documentation

## 2024-05-22 HISTORY — PX: IR IMAGING GUIDED PORT INSERTION: IMG5740

## 2024-05-22 MED ORDER — LIDOCAINE HCL 1 % IJ SOLN
INTRAMUSCULAR | Status: AC
Start: 2024-05-22 — End: 2024-05-22
  Filled 2024-05-22: qty 20

## 2024-05-22 MED ORDER — FENTANYL CITRATE (PF) 100 MCG/2ML IJ SOLN
INTRAMUSCULAR | Status: AC | PRN
  Administered 2024-05-22 (×2): 50 ug via INTRAVENOUS

## 2024-05-22 MED ORDER — SODIUM CHLORIDE 0.9 % IV SOLN: INTRAVENOUS | Status: DC

## 2024-05-22 MED ORDER — HEPARIN SOD (PORK) LOCK FLUSH 100 UNIT/ML IV SOLN
500.0000 [IU] | Freq: Once | INTRAVENOUS | Status: AC
Start: 2024-05-22 — End: 2024-05-22
  Administered 2024-05-22: 500 [IU] via INTRAVENOUS

## 2024-05-22 MED ORDER — LIDOCAINE HCL 1 % IJ SOLN
14.0000 mL | Freq: Once | INTRAMUSCULAR | Status: AC
  Administered 2024-05-22: 14 mL via INTRADERMAL

## 2024-05-22 MED ORDER — MIDAZOLAM HCL (PF) 2 MG/2ML IJ SOLN
INTRAMUSCULAR | Status: AC | PRN
  Administered 2024-05-22 (×2): 1 mg via INTRAVENOUS

## 2024-05-22 MED ORDER — FENTANYL CITRATE (PF) 100 MCG/2ML IJ SOLN
INTRAMUSCULAR | Status: AC
  Filled 2024-05-22: qty 2

## 2024-05-22 MED ORDER — MIDAZOLAM HCL 2 MG/2ML IJ SOLN
INTRAMUSCULAR | Status: AC
  Filled 2024-05-22: qty 2

## 2024-05-22 MED ORDER — ONDANSETRON HCL 4 MG/2ML IJ SOLN
INTRAMUSCULAR | Status: AC
  Filled 2024-05-22: qty 2

## 2024-05-22 MED ORDER — ONDANSETRON HCL 4 MG/2ML IJ SOLN
INTRAMUSCULAR | Status: AC | PRN
  Administered 2024-05-22: 4 mg via INTRAVENOUS

## 2024-05-22 MED ORDER — HEPARIN SOD (PORK) LOCK FLUSH 100 UNIT/ML IV SOLN
INTRAVENOUS | Status: AC
  Filled 2024-05-22: qty 5

## 2024-05-22 NOTE — Progress Notes (Signed)
 Carlin Griffon PA at bedside to assess/speak with patient.  Per Carlin, OK to discharge patient home with O2 sat in the 80's.

## 2024-05-22 NOTE — Progress Notes (Signed)
 Patient clinically stable post IR Port placement per Dr Luverne, tolerated well. Vitals stable pre and post procedure. Received Versed  2 mg along with Fentanyl  100 mcg IV for procedure. Patient's baseline pre procedure o2 sats 87-89%, with both PA and DR Yamagata made aware, patient at that time sr no resp distress, no tachycardia. Will monitor post procedure and if patient gets back to baseline 87-90 % on room air, may be discharged. Discussed with Rutha Stacks RN with report given post procedure/specials/9

## 2024-05-22 NOTE — Procedures (Signed)
 Interventional Radiology Procedure Note  Procedure: Single Lumen Power Port Placement    Access:  Right IJ vein.  Findings: Catheter tip positioned at SVC/RA junction. Port is ready for immediate use.   Complications: None  EBL: < 10 mL  Recommendations:  - Ok to shower in 24 hours - Do not submerge for 7 days - Routine line care   Kaylee Gordon T. Fredia Sorrow, M.D Pager:  [phone]

## 2024-05-22 NOTE — Discharge Instructions (Addendum)
 Kaylee Gordon

## 2024-05-22 NOTE — Progress Notes (Addendum)
  IR BRIEF PROGRESS NOTE:  Providers were made aware by RN that patient is doing well and is at baseline presentation one hour after recovery from Port-A-Cath placement. However, patient's O2 saturation on room air remains between 82% and 85% (baseline 86% - 90%).  On examination, patient is alert x 3, has good tone, and is otherwise with stable vital signs. She is not tachypneic, and moving air well. She states she feels at baseline. Her husband, at bedside, corroborates, and neither patient nor husband appear concerned about patient's presentation. Patient and her husband both express the wish to go home.  I advised patient that her oxygen saturation remains below her already low baseline. I advised patient that I would need to admit her for observation due to this. Patient and her husband still expressed their wish to go home.  I reviewed this information with Dr. Luverne, who agrees to discharge patient home, with the following caveats:  - Patient and her husband were both given very strict precautions to call 911 for an ambulance or present to ED emergently should the patient experience any of the following: dizziness, grogginess, somnolence refractory to attempting to wake patient up, chest pain, chest tightness, shortness of breath, rapid or persistent shallow breathing, use of accessory muscles to breathe, blurry vision, falling, difficulty walking, weakness worse than baseline, or any general malaise.   - Patient and her husband do endorse having a pulse oximeter at home, and state they do use it. I advised patient and her husband to use it regularly and often. Should patient's saturation dip into the 70%'s, patient's husband was advised to call 911 immediately.  - Patient and her husband were further advised to limit patient's mobility today, as she is a high fall risk due to recent sedation. Patient should elevate her head of bed for the next 24 hours, and any time he oxygen saturation is  down trending.   - I encouraged the patient and her husband to maintain close communication, and encouraged that talking to each other will in and of itself help improve patient's oxygen saturation.  - I encouraged patient's husband to check on patient often overnight.  - I encouraged patient to consider a bedside commode to minimize her fall risk.   - I encouraged patient and her husband to address patient's low oxygen saturation baseline with her care team. I advised she may benefit from intermittent or permanent O2 supplementation going forward.  Per patient request and her husband's, Dr. Luverne approves discharge home, given these precautions. RNs advised to discharge.     Electronically Signed: Carlin DELENA Griffon, PA-C 05/22/2024, 3:37 PM

## 2024-05-22 NOTE — Progress Notes (Signed)
 [facility] CSW Progress Note  Clinical Social Work introduced self to patient during Patient Education with Raoul Moats, Charity fundraiser.  Provided information regarding CSW role, including counseling, advanced care planning and support group.  Answered questions as needed.  Follow Up Plan:  CSW will follow-up with patient by phone     Macario CHRISTELLA Au, LCSW Clinical Social Worker [facility]

## 2024-05-23 ENCOUNTER — Inpatient Hospital Stay: Admitting: Internal Medicine

## 2024-05-23 ENCOUNTER — Telehealth: Payer: Self-pay

## 2024-05-23 ENCOUNTER — Inpatient Hospital Stay

## 2024-05-23 ENCOUNTER — Encounter: Payer: Self-pay | Admitting: Internal Medicine

## 2024-05-23 ENCOUNTER — Encounter: Payer: Self-pay | Admitting: *Deleted

## 2024-05-23 VITALS — BP 127/73 | HR 78 | Temp 99.1°F | Resp 20 | Ht 64.0 in | Wt 155.9 lb

## 2024-05-23 DIAGNOSIS — D47Z9 Other specified neoplasms of uncertain behavior of lymphoid, hematopoietic and related tissue: Secondary | ICD-10-CM | POA: Diagnosis not present

## 2024-05-23 DIAGNOSIS — D509 Iron deficiency anemia, unspecified: Secondary | ICD-10-CM | POA: Diagnosis not present

## 2024-05-23 DIAGNOSIS — Z5111 Encounter for antineoplastic chemotherapy: Secondary | ICD-10-CM | POA: Diagnosis present

## 2024-05-23 DIAGNOSIS — C9110 Chronic lymphocytic leukemia of B-cell type not having achieved remission: Secondary | ICD-10-CM | POA: Diagnosis not present

## 2024-05-23 DIAGNOSIS — F419 Anxiety disorder, unspecified: Secondary | ICD-10-CM | POA: Diagnosis not present

## 2024-05-23 DIAGNOSIS — D649 Anemia, unspecified: Secondary | ICD-10-CM

## 2024-05-23 LAB — CBC WITH DIFFERENTIAL (CANCER CENTER ONLY)
Abs Immature Granulocytes: 0.04 K/uL (ref 0.00–0.07)
Basophils Absolute: 0.1 K/uL (ref 0.0–0.1)
Basophils Relative: 1 %
Eosinophils Absolute: 0.1 K/uL (ref 0.0–0.5)
Eosinophils Relative: 3 %
HCT: 34.7 % — ABNORMAL LOW (ref 36.0–46.0)
Hemoglobin: 11.1 g/dL — ABNORMAL LOW (ref 12.0–15.0)
Immature Granulocytes: 1 %
Lymphocytes Relative: 20 %
Lymphs Abs: 1.1 K/uL (ref 0.7–4.0)
MCH: 25.5 pg — ABNORMAL LOW (ref 26.0–34.0)
MCHC: 32 g/dL (ref 30.0–36.0)
MCV: 79.6 fL — ABNORMAL LOW (ref 80.0–100.0)
Monocytes Absolute: 0.7 K/uL (ref 0.1–1.0)
Monocytes Relative: 14 %
Neutro Abs: 3.4 K/uL (ref 1.7–7.7)
Neutrophils Relative %: 61 %
Platelet Count: 251 K/uL (ref 150–400)
RBC: 4.36 MIL/uL (ref 3.87–5.11)
RDW: 14.9 % (ref 11.5–15.5)
WBC Count: 5.4 K/uL (ref 4.0–10.5)
nRBC: 0 % (ref 0.0–0.2)

## 2024-05-23 LAB — BASIC METABOLIC PANEL - CANCER CENTER ONLY
Anion gap: 10 (ref 5–15)
BUN: 19 mg/dL (ref 8–23)
CO2: 21 mmol/L — ABNORMAL LOW (ref 22–32)
Calcium: 8.9 mg/dL (ref 8.9–10.3)
Chloride: 103 mmol/L (ref 98–111)
Creatinine: 1.06 mg/dL — ABNORMAL HIGH (ref 0.44–1.00)
GFR, Estimated: 59 mL/min — ABNORMAL LOW (ref >60)
Glucose, Bld: 127 mg/dL — ABNORMAL HIGH (ref 70–99)
Potassium: 3.7 mmol/L (ref 3.5–5.1)
Sodium: 134 mmol/L — ABNORMAL LOW (ref 135–145)

## 2024-05-23 MED ORDER — LIDOCAINE-PRILOCAINE 2.5-2.5 % EX CREA: 1.0000 | TOPICAL_CREAM | CUTANEOUS | 1 refills | Status: AC | PRN

## 2024-05-23 MED ORDER — PROCHLORPERAZINE MALEATE 10 MG PO TABS: 10.0000 mg | ORAL_TABLET | Freq: Four times a day (QID) | ORAL | 1 refills | Status: AC | PRN

## 2024-05-23 MED ORDER — ONDANSETRON HCL 8 MG PO TABS: 8.0000 mg | ORAL_TABLET | Freq: Three times a day (TID) | ORAL | 0 refills | Status: AC | PRN

## 2024-05-23 NOTE — Progress Notes (Signed)
START ON PATHWAY REGIMEN - Lymphoma and CLL     A cycle is every 28 days:     Rituximab-xxxx      Bendamustine   **Always confirm dose/schedule in your pharmacy ordering system**  Patient Characteristics: Waldenstrom Macroglobulinemia/Lymphoplasmacytic Lymphoma, Symptomatic, First Line Disease Type: Waldenstrom Macroglobulinemia/Lymphoplasmacytic Lymphoma Disease Type: Not Applicable Disease Type: Not Applicable Asymptomatic or Symptomatic<= Symptomatic Line of Therapy: First Line Intent of Therapy: Non-Curative / Palliative Intent, Discussed with Patient 

## 2024-05-23 NOTE — Telephone Encounter (Signed)
 Clinical Social Work was referred by medical provider due to distress screen score.  CSW attempted to contact patient by phone.  Left voicemail with contact information and request for return call.

## 2024-05-23 NOTE — Progress Notes (Signed)
 Met with patient during follow up visit with Dr. Rennie. All questions answered during visit. Informed that will be called with her appt to start chemotherapy. Dressing to port placement removed. Site is clean, dry, and intact. Instructed pt to call with any questions or needs. Pt verbalized understanding.

## 2024-05-23 NOTE — Assessment & Plan Note (Addendum)
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  Coexpression of CLL.  MYD 88 pending OCT 2025-PET scan: extensive hypermetabolic perilymphatic nodularity with nodular and masslike areas of consolidation in the lungs as well as borderline hypermetabolic mediastinal lymph nodes and hypermetabolic abdominal retroperitoneal and mesenteric lymph nodes.   # I recommend Bendamustine-rituximab-Bendamustine day 2 every 28-day cycle.  I reviewed at length the individual components with chemotherapy; and the schedule in detail.  I also discussed the potential side effects including but not limited to-increasing fatigue, nausea vomiting, diarrhea,sores in the mouth, increase risk of infection. Also reviewed the multiple strategies to avoid/mitigate similar side effects including preemptive medications-for nausea vomiting.  Also discussed at length regarding good oral hygiene/hydration and in general precautions regarding duration of infections.  Less likely patient will have hair loss.  Again low risk of neuropathy.  # Understands treatments are palliative/to control the disease unfortunately not curable.  However given the low-grade lymphoma suspect fairly long remission rates in order for years.  # Iron  deficiency anemia-?  Hiatal hernia.  Monitor for now  # Anxiety: Monitor closely on chemotherapy.  # IV access: sp port placement.  Also sent prescriptions for nausea medication.  # DISPOSITION: # next week- APP- port-lab- cbc/cmp; benda-Ritux; LDH-D-2 benda- #  in 5 weeks- MD; port lab- cbc/cmp; benda-Ritux; L[facility];; D-2 benda-Dr.B  # 40 minutes face-to-face with the patient discussing the above plan of care; more than 50% of time spent on prognosis/ natural history; counseling and coordination.

## 2024-05-23 NOTE — Progress Notes (Signed)
 [facility] Psychosocial Distress Screening Clinical Social Work  Kaylee Gordon is a 63 y.o. year old female. Clinical Social Work was referred by nurse navigator for positive distress screening. The patient scored a 10 on the Psychosocial Distress Thermometer which indicates severe distress. Clinical Social Worker met with patient to assess for distress and other psychosocial needs.     Distress Screen:    05/22/2024    2:55 PM  ONCBCN DISTRESS SCREENING  How much distress have you been experiencing in the past week? (0-10) 10  Practical concerns type Housing/Utilities  Social concerns type Relationship with spouse or partner;Relationship with children  Emotional concerns type Worry or anxiety;Sadness or depression;Loss of interest or enjoyment;Grief or loss;Fear;Loneliness;Anger;Feelings of worthlessness or being a burden  Physical Concerns Type  Sleep;Fatigue;Memory or concentration;Sexual health;Changes in eating;Loss or change of physical abilities     Interventions: Provided brief mental health counseling with regard to adjusting to her illness.  She received her port yesterday and attended chemo education, which was overwhelming.  She stated she felt better today, but feels her O2 sats are low.  She is scheduled to see MD today.     CSW and patient discussed common feeling and emotions when being diagnosed with cancer, and the importance of support during treatment.  CSW informed patient of the support team and support services at [facility].  CSW provided contact information and encouraged patient to call with any questions or concerns.   Follow Up Plan: CSW will follow-up with patient by phone  Patient verbalizes understanding of plan: Yes    Kaylee CHRISTELLA Elnora Quizon, LCSW

## 2024-05-23 NOTE — Progress Notes (Signed)
 Needs refills on zofran  and compazine, also, needs rx for emla, pended.  Pt states O2 has been in the 70s. Feeling SOB all the time.   Heart and vascular told her to have us  take her port dressing off. Port was placed yesterday.  No appetite, was drinking fair life but stopped due to making her feel sick.  Can she take her probiotic, align?

## 2024-05-25 ENCOUNTER — Encounter: Payer: Self-pay | Admitting: Internal Medicine

## 2024-05-25 ENCOUNTER — Other Ambulatory Visit: Payer: Self-pay

## 2024-05-25 NOTE — Progress Notes (Signed)
 [name], [name], [name], [name], [name]  CHIEF COMPLAINTS/PURPOSE OF CONSULTATION: Lymphoma   HEMATOLOGY HISTORY  # ANEMIA[Hb; MCV-platelets- WBC; Iron  sat; ferritin;  GFR- CT/US ; EGD/colonoscopy-  Oncology History   No history exists.   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  Coexpression of CLL.  MYD 88 pending OCT 2025-PET scan: extensive hypermetabolic perilymphatic nodularity with nodular and masslike areas of consolidation in the lungs as well as borderline hypermetabolic mediastinal lymph nodes and hypermetabolic abdominal retroperitoneal and mesenteric lymph nodes.   # NOV 17th, 2025-Bendamustine-rituximab-Bendamustine day 2 every 28-day cycle.    HISTORY OF PRESENTING ILLNESS: Patient ambulating-independently. With husband.    Kaylee Gordon 63 y.o.  female with  pleasant patient with history of bipolar disorder/fibromyalgia with iron  deficiency anemia from  hiatal hernia-and abnormal Ig M-and kappa lambda light chain ratio-and a new diagnosis of lymphoma status post retroperitoneal lymph node biopsy/bone marrow biopsy is here for follow-up.  Patient underwent Mediport placement.   Patient continues to complain of cough and difficulty breathing with exertion.  No significant night sweats or significant weight loss.   Review of Systems  Constitutional:  Positive for malaise/fatigue. Negative for chills, diaphoresis, fever and weight loss.  HENT:  Negative for nosebleeds and sore throat.   Eyes:  Negative for double vision.  Respiratory:  Negative for cough, hemoptysis, sputum production, shortness of breath and  wheezing.   Cardiovascular:  Negative for chest pain, palpitations, orthopnea and leg swelling.  Gastrointestinal:  Negative for abdominal pain, blood in stool, constipation, diarrhea, heartburn, melena, nausea and vomiting.  Genitourinary:  Negative for dysuria, frequency and urgency.  Musculoskeletal:  Positive for back pain and joint pain.  Skin: Negative.  Negative for itching and rash.  Neurological:  Negative for dizziness, tingling, focal weakness, weakness and headaches.  Endo/Heme/Allergies:  Does not bruise/bleed easily.  Psychiatric/Behavioral:  Negative for depression. The patient is not nervous/anxious and does not have insomnia.      MEDICAL HISTORY:  Past Medical History:  Diagnosis Date   Anxiety    Asthma    B12 deficiency    Bipolar disorder (HCC)    Borderline diabetes    CHF (congestive heart failure) (HCC)    Colon polyps    Depression    Dyspnea    Family history of adverse reaction to anesthesia    mom and dad-delayed emergence   Fibromyalgia    Hilar adenopathy    History of hiatal hernia    History of melanoma    History of methicillin resistant staphylococcus aureus (MRSA) 2010   Hypertension    Hypoparathyroidism    Hypothyroidism    IDA (iron  deficiency anemia)    Lung nodules    Mediastinal adenopathy    PONV (postoperative nausea and vomiting)    delayed emergence   Prolonged Q-T interval on ECG    Thyroid  disease     SURGICAL HISTORY: Past Surgical History:  Procedure Laterality Date   ANKLE SURGERY Right 2012   APPENDECTOMY     AUGMENTATION MAMMAPLASTY Bilateral    CESAREAN SECTION     x2   COLONOSCOPY WITH PROPOFOL  N/A 02/22/2015   Procedure: COLONOSCOPY WITH PROPOFOL ;  Surgeon: Donnice Vaughn Manes, MD;  Location: [facility]  ENDOSCOPY;  Service: Endoscopy;  Laterality: N/A;   COLONOSCOPY WITH PROPOFOL  N/A 08/08/2022   Procedure: COLONOSCOPY WITH PROPOFOL ;  Surgeon: Maryruth Ole DASEN, MD;  Location: [facility] ENDOSCOPY;  Service: Endoscopy;   Laterality: N/A;   ESOPHAGOGASTRODUODENOSCOPY (EGD) WITH PROPOFOL  N/A 02/22/2015   Procedure: ESOPHAGOGASTRODUODENOSCOPY (EGD) WITH PROPOFOL ;  Surgeon: Donnice Vaughn Manes, MD;  Location: [facility] ENDOSCOPY;  Service: Endoscopy;  Laterality: N/A;   ESOPHAGOGASTRODUODENOSCOPY (EGD) WITH PROPOFOL  N/A 02/11/2016   Procedure: ESOPHAGOGASTRODUODENOSCOPY (EGD) WITH PROPOFOL ;  Surgeon: Lamar DASEN Holmes, MD;  Location: [facility] ENDOSCOPY;  Service: Endoscopy;  Laterality: N/A;   ESOPHAGOGASTRODUODENOSCOPY (EGD) WITH PROPOFOL  N/A 08/08/2022   Procedure: ESOPHAGOGASTRODUODENOSCOPY (EGD) WITH PROPOFOL ;  Surgeon: Maryruth Ole DASEN, MD;  Location: [facility] ENDOSCOPY;  Service: Endoscopy;  Laterality: N/A;   HEMORRHOID SURGERY     IR IMAGING GUIDED PORT INSERTION  05/22/2024   NASAL SINUS SURGERY     SAVORY DILATION N/A 02/22/2015   Procedure: SAVORY DILATION;  Surgeon: Donnice Vaughn Manes, MD;  Location: [facility] ENDOSCOPY;  Service: Endoscopy;  Laterality: N/A;   SAVORY DILATION N/A 02/11/2016   Procedure: SAVORY DILATION;  Surgeon: Lamar DASEN Holmes, MD;  Location: [facility] ENDOSCOPY;  Service: Endoscopy;  Laterality: N/A;   SKIN CANCER EXCISION     VIDEO BRONCHOSCOPY WITH ENDOBRONCHIAL NAVIGATION N/A 04/11/2024   Procedure: VIDEO BRONCHOSCOPY WITH ENDOBRONCHIAL NAVIGATION;  Surgeon: Parris Manna, MD;  Location: [facility] ORS;  Service: Thoracic;  Laterality: N/A;   VIDEO BRONCHOSCOPY WITH ENDOBRONCHIAL ULTRASOUND N/A 04/11/2024   Procedure: BRONCHOSCOPY, WITH EBUS;  Surgeon: Parris Manna, MD;  Location: [facility] ORS;  Service: Thoracic;  Laterality: N/A;    SOCIAL HISTORY: Social History   Socioeconomic History   Marital status: Married    Spouse name: Ozell   Number of children: Not on file   Years of education: Not on file   Highest education level: Not on file  Occupational History   Not on file  Tobacco Use   Smoking status: Never   Smokeless tobacco: Never  Vaping Use   Vaping status: Never Used  Substance  and Sexual Activity   Alcohol use: Yes    Comment: rare wine   Drug use: No   Sexual activity: Not Currently    Partners: Male  Other Topics Concern   Not on file  Social History Narrative   Lives in [place]; taught high school- math; retd; no smoking; social alcohol.    Social Drivers of Corporate Investment Banker Strain: Low Risk  (08/07/2023)   Received from [facility]   Overall Financial Resource Strain (CARDIA)    Difficulty of Paying Living Expenses: Not hard at all  Food Insecurity: No Food Insecurity (02/25/2024)   Hunger Vital Sign    Worried About Running Out of Food in the Last Year: Never true    Ran Out of Food in the Last Year: Never true  Transportation Needs: No Transportation Needs (02/25/2024)   PRAPARE - Administrator, Civil Service (Medical): No    Lack of Transportation (Non-Medical): No  Physical Activity: Unknown (05/16/2018)   Exercise Vital Sign    Days of Exercise per Week: 0 days    Minutes of Exercise per Session: Not on file  Stress: Stress Concern Present (05/16/2018)   [employer] of Occupational Health - Occupational Stress Questionnaire    Feeling of Stress : Very much  Social Connections: Unknown (05/16/2018)   Social Connection and Isolation Panel    Frequency of Communication with Friends and  Family: Twice a week    Frequency of Social Gatherings with Friends and Family: Twice a week    Attends Religious Services: More than 4 times per year    Active Member of [employer] or Organizations: Not on file    Attends Banker Meetings: Not on file    Marital Status: Married  Intimate Partner Violence: Not At Risk (02/25/2024)   Humiliation, Afraid, Rape, and Kick questionnaire    Fear of Current or Ex-Partner: No    Emotionally Abused: No    Physically Abused: No    Sexually Abused: No    FAMILY HISTORY: Family History  Problem Relation Age of Onset   Cancer Mother        colon cancer    Depression Mother    Anxiety disorder Mother    Heart disease Father    Cancer Father        ? neck cancer- still living.    Anxiety disorder Father    Depression Father    Colon cancer Maternal Aunt        died in 71s.    Hypoparathyroidism Son     ALLERGIES:  is allergic to chlorzoxazone, levofloxacin, atorvastatin, hydrochlorothiazide, pollen extract, and pseudoephedrine.  MEDICATIONS:  Current Outpatient Medications  Medication Sig Dispense Refill   busPIRone  (BUSPAR ) 10 MG tablet Take 4 tablets (40 mg total) by mouth as directed. TAKE 1 TABLET BY MOUTH 3 TIMES A DAY AND 1 EXTRA TABLET AS NEEDED FOR SEVERE ANXIETY 120 tablet 2   calcitRIOL  (ROCALTROL ) 0.25 MCG capsule Take 1 capsule (0.25 mcg total) by mouth daily. 90 capsule 3   escitalopram  (LEXAPRO ) 10 MG tablet Take 1.5 tablets (15 mg total) by mouth daily. (Patient taking differently: Take 15 mg by mouth every morning.) 135 tablet 0   esomeprazole (NEXIUM) 40 MG capsule Take 1 capsule by mouth every morning.     fenofibrate 160 MG tablet Take 1 tablet by mouth daily.     fluticasone-salmeterol (ADVAIR) 250-50 MCG/ACT AEPB Inhale 1 puff into the lungs in the morning and at bedtime.     gabapentin  (NEURONTIN ) 300 MG capsule Take 300 mg by mouth See admin instructions. 2 CAP AM, 1 CAP Q noon, 2 CAP pm,     ipratropium (ATROVENT ) 0.03 % nasal spray Place 1 spray into both nostrils 2 (two) times daily.     isosorbide mononitrate (IMDUR) 30 MG 24 hr tablet Take 30 mg by mouth 2 (two) times daily.     lamoTRIgine  (LAMICTAL ) 200 MG tablet Take 0.5 tablets (100 mg total) by mouth 2 (two) times daily. 90 tablet 1   levothyroxine  (SYNTHROID ) 75 MCG tablet Take 1 tablet (75 mcg total) by mouth daily. (Patient taking differently: Take 75 mcg by mouth daily before breakfast.) 90 tablet 3   lisinopril (ZESTRIL) 10 MG tablet Take 10 mg by mouth daily.     lovastatin (MEVACOR) 20 MG tablet Take 20 mg by mouth at bedtime.     montelukast  (SINGULAIR) 10 MG tablet Take 10 mg by mouth at bedtime.     prazosin  (MINIPRESS ) 2 MG capsule Take 1 capsule (2 mg total) by mouth at bedtime. (Patient taking differently: Take 2 mg by mouth at bedtime. For Nightmares) 90 capsule 1   propranolol ER (INDERAL LA) 80 MG 24 hr capsule Take 80 mg by mouth every morning.     RESTASIS 0.05 % ophthalmic emulsion Place 1 drop into both eyes 2 (two) times daily.  rOPINIRole  (REQUIP ) 0.25 MG tablet TAKE 1 TABLET BY MOUTH 3 TIMES A DAY 90 tablet 2   temazepam  (RESTORIL ) 30 MG capsule Take 1 capsule (30 mg total) by mouth at bedtime. 30 capsule 2   TRYPTYR 0.003 % SOLN Apply 1 drop to eye 2 (two) times daily.     lidocaine -prilocaine (EMLA) cream Apply 1 Application topically as needed. 30 g 1   ondansetron  (ZOFRAN ) 8 MG tablet Take 1 tablet (8 mg total) by mouth every 8 (eight) hours as needed for nausea or vomiting (nausea related to iron  infusions). 60 tablet 0   prochlorperazine (COMPAZINE) 10 MG tablet Take 1 tablet (10 mg total) by mouth every 6 (six) hours as needed for nausea or vomiting. 30 tablet 1   No current facility-administered medications for this visit.   PHYSICAL EXAMINATION:   Vitals:   05/23/24 1307  BP: 127/73  Pulse: 78  Resp: 20  Temp: 99.1 F (37.3 C)  SpO2: 93%   Filed Weights   05/23/24 1307  Weight: 155 lb 14.4 oz (70.7 kg)     Physical Exam Vitals and nursing note reviewed.  HENT:     Head: Normocephalic and atraumatic.     Mouth/Throat:     Pharynx: Oropharynx is clear.  Eyes:     Extraocular Movements: Extraocular movements intact.     Pupils: Pupils are equal, round, and reactive to light.  Cardiovascular:     Rate and Rhythm: Normal rate and regular rhythm.  Pulmonary:     Comments: Decreased breath sounds bilaterally.  Abdominal:     Palpations: Abdomen is soft.  Musculoskeletal:        General: Normal range of motion.     Cervical back: Normal range of motion.  Skin:    General: Skin is warm.   Neurological:     General: No focal deficit present.     Mental Status: She is alert and oriented to person, place, and time.  Psychiatric:        Behavior: Behavior normal.        Judgment: Judgment normal.      LABORATORY DATA:  I have reviewed the data as listed Lab Results  Component Value Date   WBC 5.4 05/23/2024   HGB 11.1 (L) 05/23/2024   HCT 34.7 (L) 05/23/2024   MCV 79.6 (L) 05/23/2024   PLT 251 05/23/2024   Recent Labs    07/24/23 1539 02/25/24 1501 05/23/24 1259  NA 142 138 134*  K 3.8 4.0 3.7  CL 106 106 103  CO2 26 23 21*  GLUCOSE 149* 89 127*  BUN 21 24* 19  CREATININE 1.26* 1.18* 1.06*  CALCIUM 9.0 9.0 8.9  GFRNONAA  --  52* 59*  PROT  --  6.8  --   ALBUMIN  --  4.0  --   AST  --  22  --   ALT  --  12  --   ALKPHOS  --  71  --   BILITOT  --  0.5  --      IR IMAGING GUIDED PORT INSERTION Result Date: 05/22/2024 CLINICAL DATA:  Marginal zone B-cell lymphoma and need for porta cath for chemotherapy. EXAM: IMPLANTED PORT A CATH PLACEMENT WITH ULTRASOUND AND FLUOROSCOPIC GUIDANCE ANESTHESIA/SEDATION: Moderate (conscious) sedation was employed during this procedure. A total of Versed  2.0 mg and Fentanyl  100 mcg was administered intravenously. Moderate Sedation Time: 30 minutes. The patient's level of consciousness and vital signs were monitored continuously by radiology nursing  throughout the procedure under my direct supervision. FLUOROSCOPY: Radiation Exposure Index: 2.0 CT mGy Kerma PROCEDURE: The procedure, risks, benefits, and alternatives were explained to the patient. Questions regarding the procedure were encouraged and answered. The patient understands and consents to the procedure. A time-out was performed prior to initiating the procedure. Ultrasound was utilized to confirm patency of the right internal jugular vein. An ultrasound image was saved and recorded. The right neck and chest were prepped with chlorhexidine  in a sterile fashion, and a sterile  drape was applied covering the operative field. Maximum barrier sterile technique with sterile gowns and gloves were used for the procedure. Local anesthesia was provided with 1% lidocaine . After creating a small venotomy incision, a 21 gauge needle was advanced into the right internal jugular vein under direct, real-time ultrasound guidance. Ultrasound image documentation was performed. After securing guidewire access, an 8 Fr dilator was placed. A J-wire was kinked to measure appropriate catheter length. A subcutaneous port pocket was then created along the upper chest wall utilizing sharp and blunt dissection. Portable cautery was utilized. The pocket was irrigated with sterile saline. A single lumen power injectable port was chosen for placement. The 8 Fr catheter was tunneled from the port pocket site to the venotomy incision. The port was placed in the pocket. External catheter was trimmed to appropriate length based on guidewire measurement. At the venotomy, an 8 Fr peel-away sheath was placed over a guidewire. The catheter was then placed through the sheath and the sheath removed. Final catheter positioning was confirmed and documented with a fluoroscopic spot image. The port was accessed with a needle and aspirated and flushed with heparinized saline. The access needle was removed. The venotomy and port pocket incisions were closed with subcutaneous 3-0 Monocryl and subcuticular 4-0 Vicryl. Dermabond was applied to both incisions. COMPLICATIONS: COMPLICATIONS None FINDINGS: After catheter placement, the tip lies at the cavo-atrial junction. The catheter aspirates normally and is ready for immediate use. IMPRESSION: Placement of single lumen port a cath via right internal jugular vein. The catheter tip lies at the cavo-atrial junction. A power injectable port a cath was placed and is ready for immediate use. Electronically Signed   By: Marcey Moan M.D.   On: 05/22/2024 14:29   CT ABDOMINAL MASS  BIOPSY Result Date: 05/08/2024 INDICATION: Central abdominal mass. EXAM: CT-guided biopsy TECHNIQUE: Multidetector CT imaging of the abdomen was performed following the standard protocol without IV contrast. RADIATION DOSE REDUCTION: This exam was performed according to the departmental dose-optimization program which includes automated exposure control, adjustment of the mA and/or kV according to patient size and/or use of iterative reconstruction technique. MEDICATIONS: None. ANESTHESIA/SEDATION: Moderate (conscious) sedation was employed during this procedure. A total of Versed  1.5 mg and Fentanyl  75 mcg was administered intravenously by the radiology nurse. Total intra-service moderate Sedation Time: 57 minutes. The patient's level of consciousness and vital signs were monitored continuously by radiology nursing throughout the procedure under my direct supervision. COMPLICATIONS: None immediate. PROCEDURE: Informed written consent was obtained from the patient after a thorough discussion of the procedural risks, benefits and alternatives. All questions were addressed. Maximal Sterile Barrier Technique was utilized including caps, mask, sterile gowns, sterile gloves, sterile drape, hand hygiene and skin antiseptic. A timeout was performed prior to the initiation of the procedure. In a supine position radiopaque markers were placed on the patient's skin and axial images were obtained. The central mass is again identified. The patient's skin was marked, prepped, and draped in the usual  sterile fashion. A 17 gauge introducer was advanced through a small incision near the umbilicus and advanced into the peritoneal cavity. Position was suboptimal. The needle was withdrawn and readvanced into the lesion in a more satisfactory location. Once the needle was verified by reimaging to be within the lesion, 2 core samples were obtained. After the core samples were obtained, the lesion was hemorrhaging and therefore  Gel-Foam was injected into the introducer. Final image was obtained demonstrating no hemorrhage. Sterile dressing was applied. IMPRESSION: Satisfactory core needle biopsy of a central abdominal mass. Electronically Signed   By: Cordella Banner   On: 05/08/2024 08:55   CT BONE MARROW BIOPSY & ASPIRATION Result Date: 05/08/2024 CLINICAL DATA:  Iron -deficiency anemia. EXAM: CT-guided bone marrow biopsy TECHNIQUE: CT pelvis CONTRAST:  None RADIOPHARMACEUTICALS:  None COMPARISON:  None FINDINGS: The patient was placed in prone position on the CT gantry. Radiopaque markers were placed on the patient's skin and initial imaging of the pelvis was performed. The patient's skin was then prepped and draped in the usual sterile fashion. Moderate sedation was provided for by the nursing staff under my supervision utilizing intravenous Versed  and fentanyl . The nurse had no other duties other than monitoring the patient and providing sedation during the procedure. I was present for the entire procedure. 1.5 mg intravenous Versed  and 75 mcg of intravenous fentanyl  for a total sedation time of 57 minutes. 1% lidocaine  was used to infiltrate the skin at the access site prior to a stab incision. Local anesthesia was then used to infiltrate the region of soft tissue from the skin to the right iliac bone. The bone marrow needle was then advanced and imaging demonstrated the needle tip to be in the cortex of the right iliac bone. The bone was then penetrated and a sample was obtained. After the sample was evaluated, approximately 5 mL of heparinized bone marrow sample was obtained by aspiration. A core sample was then obtained. Multiple attempts at sampling was performed in order to get 2 1 cm segments. All needles were then removed from the patient. Sterile dressing was applied. IMPRESSION: Satisfactory core needle biopsy and aspiration of the right iliac bone marrow under CT guidance. 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  Coexpression of CLL.  MYD 88 pending OCT 2025-PET scan: extensive hypermetabolic perilymphatic nodularity with nodular and masslike areas of consolidation in the lungs as well as borderline hypermetabolic mediastinal lymph nodes and hypermetabolic abdominal retroperitoneal and mesenteric lymph nodes.   # I recommend Bendamustine-rituximab-Bendamustine day 2 every 28-day cycle.  I reviewed at length the individual components with chemotherapy; and the schedule in detail.  I also discussed the potential side effects including but not limited to-increasing fatigue, nausea vomiting, diarrhea,sores in the mouth, increase risk of infection. Also reviewed the multiple strategies to avoid/mitigate similar side effects including preemptive medications-for nausea vomiting.  Also discussed at length regarding good oral hygiene/hydration and in general precautions regarding duration of infections.  Less likely patient will have hair loss.  Again low risk of neuropathy.  # Understands treatments are palliative/to control the disease unfortunately not curable.  However given the low-grade lymphoma suspect fairly long remission rates in order for years.  # Iron  deficiency anemia-?  Hiatal hernia.  Monitor for now  #  Anxiety: Monitor closely on chemotherapy.  # IV access: sp port placement.  Also sent prescriptions for nausea medication.  # DISPOSITION: # next week- APP- port-lab- cbc/cmp; benda-Ritux; LDH-D-2 benda- #  in 5 weeks- MD; port lab- cbc/cmp; benda-Ritux; LDH;; D-2 benda-Dr.B  # 40 minutes face-to-face with the patient discussing the above plan of care; more than 50% of time spent on prognosis/ natural history; counseling and coordination.      Cindy JONELLE Joe, MD 05/25/2024 5:58 PM

## 2024-05-26 ENCOUNTER — Telehealth (INDEPENDENT_AMBULATORY_CARE_PROVIDER_SITE_OTHER): Admitting: Psychiatry

## 2024-05-26 ENCOUNTER — Encounter: Payer: Self-pay | Admitting: Psychiatry

## 2024-05-26 DIAGNOSIS — G2581 Restless legs syndrome: Secondary | ICD-10-CM

## 2024-05-26 DIAGNOSIS — F3131 Bipolar disorder, current episode depressed, mild: Secondary | ICD-10-CM

## 2024-05-26 DIAGNOSIS — R52 Pain, unspecified: Secondary | ICD-10-CM

## 2024-05-26 DIAGNOSIS — F411 Generalized anxiety disorder: Secondary | ICD-10-CM | POA: Diagnosis not present

## 2024-05-26 DIAGNOSIS — G4701 Insomnia due to medical condition: Secondary | ICD-10-CM | POA: Diagnosis not present

## 2024-05-26 LAB — ACID FAST CULTURE WITH REFLEXED SENSITIVITIES (MYCOBACTERIA): Acid Fast Culture: NEGATIVE

## 2024-05-26 MED ORDER — LAMOTRIGINE 25 MG PO TABS: 25.0000 mg | ORAL_TABLET | Freq: Every day | ORAL | 1 refills | Status: DC

## 2024-05-26 MED ORDER — TEMAZEPAM 30 MG PO CAPS: 30.0000 mg | ORAL_CAPSULE | Freq: Every day | ORAL | 2 refills | Status: AC

## 2024-05-26 NOTE — Progress Notes (Signed)
 Virtual Visit via Video Note  I connected with Kaylee Gordon on 05/26/24 at  3:30 PM EST by a video enabled telemedicine application and verified that I am speaking with the correct person using two identifiers.  Location Provider Location : [facility] Patient Location : Home  Participants: Patient , Provider   I discussed the limitations of evaluation and management by telemedicine and the availability of in person appointments. The patient expressed understanding and agreed to proceed.  I discussed the assessment and treatment plan with the patient. The patient was provided an opportunity to ask questions and all were answered. The patient agreed with the plan and demonstrated an understanding of the instructions.   The patient was advised to call back or seek an in-person evaluation if the symptoms worsen or if the condition fails to improve as anticipated.   BH MD OP Progress Note  05/27/2024 2:39 PM Kaylee Gordon  MRN:  [id number]  Chief Complaint:  Chief Complaint  Patient presents with   Follow-up   Depression   Anxiety   Medication Refill   Discussed the use of AI scribe software for clinical note transcription with the patient, who gave verbal consent to proceed.  History of Present Illness Kaylee Gordon is a 63 year old Caucasian female, has a history of bipolar disorder, GAD, fibromyalgia, recent diagnosis of low-grade B-cell lymphoproliferative disorder was evaluated by telemedicine today.  Describing her mood, she reports feeling depressed with low energy and limited motivation, particularly since receiving a recent diagnosis of low-grade B-cell lymphoproliferative disorder. She continues to find processing her diagnosis challenging, as she states that some days or hours she manages to process it, while at other times she does not. She notes a lack of interest in activities, sharing that she did not feel up to doing much when her daughter visited recently. She shares  that recent bereavement has contributed to her stress, as her last living aunt died on 2025-11-24and she managed family dynamics during the funeral. She reports that support from her therapist has helped her process this loss, and she found the session beneficial. She identifies her next-door neighbor, who has had cancer twice, and a friend with advanced cancer as sources of support. She states that her husband is supportive and that her home situation is good. She denies any thoughts of hurting herself or others.  Regarding medications, she reports ongoing use of temazepam  for sleep, prazosin , Requip  for restless legs, Lexapro , Buspar  (30 mg in divided doses with the option to take an extra 10 mg), and lamotrigine  (Lamictal ) 100 mg twice daily. She confirms that her oncology team is aware of her medications. She continues to experience persistent fatigue and low energy, which she attributes to both her mood and her medical condition.  She lives at home. Her husband is supportive.  She denies any suicidality, homicidality or perceptual disturbances.     Visit Diagnosis:    ICD-10-CM   1. Bipolar 1 disorder, depressed, mild (HCC)  F31.31 lamoTRIgine  (LAMICTAL ) 25 MG tablet    2. GAD (generalized anxiety disorder)  F41.1     3. Insomnia due to medical condition  G47.01 temazepam  (RESTORIL ) 30 MG capsule   anxiety, pain,RLS      Past Psychiatric History: I have reviewed past psychiatric history from progress note on 09/11/2018.  Past trials of medications like Ambien, Lunesta , Sonata , Belsomra .  Past Medical History:  Past Medical History:  Diagnosis Date   Anxiety    Asthma  B12 deficiency    Bipolar disorder (HCC)    Borderline diabetes    CHF (congestive heart failure) (HCC)    Colon polyps    Depression    Dyspnea    Family history of adverse reaction to anesthesia    mom and dad-delayed emergence   Fibromyalgia    Hilar adenopathy    History of hiatal hernia    History  of melanoma    History of methicillin resistant staphylococcus aureus (MRSA) 2010   Hypertension    Hypoparathyroidism    Hypothyroidism    IDA (iron  deficiency anemia)    Lung nodules    Mediastinal adenopathy    PONV (postoperative nausea and vomiting)    delayed emergence   Prolonged Q-T interval on ECG    Thyroid  disease     Past Surgical History:  Procedure Laterality Date   ANKLE SURGERY Right 2012   APPENDECTOMY     AUGMENTATION MAMMAPLASTY Bilateral    CESAREAN SECTION     x2   COLONOSCOPY WITH PROPOFOL  N/A 02/22/2015   Procedure: COLONOSCOPY WITH PROPOFOL ;  Surgeon: Donnice Vaughn Manes, MD;  Location: [facility] ENDOSCOPY;  Service: Endoscopy;  Laterality: N/A;   COLONOSCOPY WITH PROPOFOL  N/A 08/08/2022   Procedure: COLONOSCOPY WITH PROPOFOL ;  Surgeon: Maryruth Ole DASEN, MD;  Location: [facility] ENDOSCOPY;  Service: Endoscopy;  Laterality: N/A;   ESOPHAGOGASTRODUODENOSCOPY (EGD) WITH PROPOFOL  N/A 02/22/2015   Procedure: ESOPHAGOGASTRODUODENOSCOPY (EGD) WITH PROPOFOL ;  Surgeon: Donnice Vaughn Manes, MD;  Location[facility] ENDOSCOPY;  Service: Endoscopy;  Laterality: N/A;   ESOPHAGOGASTRODUODENOSCOPY (EGD) WITH PROPOFOL  N/A 02/11/2016   Procedure: ESOPHAGOGASTRODUODENOSCOPY (EGD) WITH PROPOFOL ;  Surgeon: Lamar DASEN Holmes, MD;  Location: [facility] ENDOSCOPY;  Service: Endoscopy;  Laterality: N/A;   ESOPHAGOGASTRODUODENOSCOPY (EGD) WITH PROPOFOL  N/A 08/08/2022   Procedure: ESOPHAGOGASTRODUODENOSCOPY (EGD) WITH PROPOFOL ;  Surgeon: Maryruth Ole DASEN, MD;  Location: [facility] ENDOSCOPY;  Service: Endoscopy;  Laterality: N/A;   HEMORRHOID SURGERY     IR IMAGING GUIDED PORT INSERTION  05/22/2024   NASAL SINUS SURGERY     SAVORY DILATION N/A 02/22/2015   Procedure: SAVORY DILATION;  Surgeon: Donnice Vaughn Manes, MD;  Location: [facility] ENDOSCOPY;  Service: Endoscopy;  Laterality: N/A;   SAVORY DILATION N/A 02/11/2016   Procedure: SAVORY DILATION;  Surgeon: Lamar DASEN Holmes, MD;  Location: [facility] ENDOSCOPY;   Service: Endoscopy;  Laterality: N/A;   SKIN CANCER EXCISION     VIDEO BRONCHOSCOPY WITH ENDOBRONCHIAL NAVIGATION N/A 04/11/2024   Procedure: VIDEO BRONCHOSCOPY WITH ENDOBRONCHIAL NAVIGATION;  Surgeon: Parris Manna, MD;  Location: [facility] ORS;  Service: Thoracic;  Laterality: N/A;   VIDEO BRONCHOSCOPY WITH ENDOBRONCHIAL ULTRASOUND N/A 04/11/2024   Procedure: BRONCHOSCOPY, WITH EBUS;  Surgeon: Parris Manna, MD;  Location: [facility] ORS;  Service: Thoracic;  Laterality: N/A;    Family Psychiatric History: Reviewed family psychiatric history from progress note on 09/11/2018.  Family History:  Family History  Problem Relation Age of Onset   Cancer Mother        colon cancer   Depression Mother    Anxiety disorder Mother    Heart disease Father    Cancer Father        ? neck cancer- still living.    Anxiety disorder Father    Depression Father    Colon cancer Maternal Aunt        died in 72s.    Hypoparathyroidism Son     Social History: Reviewed social history from progress note on 09/11/2018. Social History   Socioeconomic History   Marital  status: Married    Spouse name: Ozell   Number of children: Not on file   Years of education: Not on file   Highest education level: Not on file  Occupational History   Not on file  Tobacco Use   Smoking status: Never   Smokeless tobacco: Never  Vaping Use   Vaping status: Never Used  Substance and Sexual Activity   Alcohol use: Yes    Comment: rare wine   Drug use: No   Sexual activity: Not Currently    Partners: Male  Other Topics Concern   Not on file  Social History Narrative   Lives in [place]; taught high school- math; retd; no smoking; social alcohol.    Social Drivers of Corporate Investment Banker Strain: Low Risk  (08/07/2023)   Received from [facility]   Overall Financial Resource Strain (CARDIA)    Difficulty of Paying Living Expenses: Not hard at all  Food Insecurity: No Food Insecurity  (02/25/2024)   Hunger Vital Sign    Worried About Running Out of Food in the Last Year: Never true    Ran Out of Food in the Last Year: Never true  Transportation Needs: No Transportation Needs (02/25/2024)   PRAPARE - Administrator, Civil Service (Medical): No    Lack of Transportation (Non-Medical): No  Physical Activity: Unknown (05/16/2018)   Exercise Vital Sign    Days of Exercise per Week: 0 days    Minutes of Exercise per Session: Not on file  Stress: Stress Concern Present (05/16/2018)   [employer] of Occupational Health - Occupational Stress Questionnaire    Feeling of Stress : Very much  Social Connections: Unknown (05/16/2018)   Social Connection and Isolation Panel    Frequency of Communication with Friends and Family: Twice a week    Frequency of Social Gatherings with Friends and Family: Twice a week    Attends Religious Services: More than 4 times per year    Active Member of [employer] or Organizations: Not on file    Attends Banker Meetings: Not on file    Marital Status: Married    Allergies:  Allergies  Allergen Reactions   Chlorzoxazone Anaphylaxis and Hives   Levofloxacin Other (See Comments) and Nausea Only    Lethargic, Flu-like symptoms, Hot flashes    Atorvastatin    Hydrochlorothiazide Other (See Comments)    Intolerance - dry lips   Pollen Extract    Pseudoephedrine Other (See Comments)    Heart racing    Metabolic Disorder Labs: Lab Results  Component Value Date   HGBA1C 5.9 (H) 02/16/2021   Lab Results  Component Value Date   PROLACTIN 18.3 07/22/2014   Lab Results  Component Value Date   CHOL 184 02/16/2021   TRIG 179 (H) 02/16/2021   HDL 37 (L) 02/16/2021   CHOLHDL 5.0 (H) 02/16/2021   LDLCALC 115 (H) 02/16/2021   Lab Results  Component Value Date   TSH 1.94 02/12/2024   TSH 2.03 07/24/2023    Therapeutic Level Labs: No results found for: LITHIUM Lab Results  Component Value Date   VALPROATE  39 (L) 02/04/2019   No results found for: CBMZ  Current Medications: Current Outpatient Medications  Medication Sig Dispense Refill   lamoTRIgine  (LAMICTAL ) 25 MG tablet Take 1 tablet (25 mg total) by mouth daily. Take along with 200 mg daily , total of 225 mg daily 30 tablet 1   busPIRone  (BUSPAR ) 10  MG tablet Take 4 tablets (40 mg total) by mouth as directed. TAKE 1 TABLET BY MOUTH 3 TIMES A DAY AND 1 EXTRA TABLET AS NEEDED FOR SEVERE ANXIETY 120 tablet 2   calcitRIOL  (ROCALTROL ) 0.25 MCG capsule Take 1 capsule (0.25 mcg total) by mouth daily. 90 capsule 3   escitalopram  (LEXAPRO ) 10 MG tablet Take 1.5 tablets (15 mg total) by mouth daily. (Patient taking differently: Take 15 mg by mouth every morning.) 135 tablet 0   esomeprazole (NEXIUM) 40 MG capsule Take 1 capsule by mouth every morning.     fenofibrate 160 MG tablet Take 1 tablet by mouth daily.     fluticasone-salmeterol (ADVAIR) 250-50 MCG/ACT AEPB Inhale 1 puff into the lungs in the morning and at bedtime.     gabapentin  (NEURONTIN ) 300 MG capsule Take 300 mg by mouth See admin instructions. 2 CAP AM, 1 CAP Q noon, 2 CAP pm,     ipratropium (ATROVENT ) 0.03 % nasal spray Place 1 spray into both nostrils 2 (two) times daily.     isosorbide mononitrate (IMDUR) 30 MG 24 hr tablet Take 30 mg by mouth 2 (two) times daily.     lamoTRIgine  (LAMICTAL ) 200 MG tablet Take 0.5 tablets (100 mg total) by mouth 2 (two) times daily. 90 tablet 1   levothyroxine  (SYNTHROID ) 75 MCG tablet Take 1 tablet (75 mcg total) by mouth daily. (Patient taking differently: Take 75 mcg by mouth daily before breakfast.) 90 tablet 3   lidocaine -prilocaine  (EMLA ) cream Apply 1 Application topically as needed. 30 g 1   lisinopril (ZESTRIL) 10 MG tablet Take 10 mg by mouth daily.     lovastatin (MEVACOR) 20 MG tablet Take 20 mg by mouth at bedtime.     montelukast (SINGULAIR) 10 MG tablet Take 10 mg by mouth at bedtime.     ondansetron  (ZOFRAN ) 8 MG tablet Take 1  tablet (8 mg total) by mouth every 8 (eight) hours as needed for nausea or vomiting (nausea related to iron  infusions). 60 tablet 0   prazosin  (MINIPRESS ) 2 MG capsule Take 1 capsule (2 mg total) by mouth at bedtime. (Patient taking differently: Take 2 mg by mouth at bedtime. For Nightmares) 90 capsule 1   prochlorperazine  (COMPAZINE ) 10 MG tablet Take 1 tablet (10 mg total) by mouth every 6 (six) hours as needed for nausea or vomiting. 30 tablet 1   propranolol ER (INDERAL LA) 80 MG 24 hr capsule Take 80 mg by mouth every morning.     RESTASIS 0.05 % ophthalmic emulsion Place 1 drop into both eyes 2 (two) times daily.     rOPINIRole  (REQUIP ) 0.25 MG tablet TAKE 1 TABLET BY MOUTH 3 TIMES A DAY 90 tablet 2   temazepam  (RESTORIL ) 30 MG capsule Take 1 capsule (30 mg total) by mouth at bedtime. 30 capsule 2   TRYPTYR 0.003 % SOLN Apply 1 drop to eye 2 (two) times daily.     No current facility-administered medications for this visit.     Musculoskeletal: Strength & Muscle Tone: UTA Gait & Station: Seated Patient leans: N/A  Psychiatric Specialty Exam: Review of Systems  Psychiatric/Behavioral:  Positive for dysphoric mood. The patient is nervous/anxious.     There were no vitals taken for this visit.There is no height or weight on file to calculate BMI.  General Appearance: Casual  Eye Contact:  Fair  Speech:  Clear and Coherent  Volume:  Normal  Mood:  Anxious and Depressed  Affect:  Congruent  Thought Process:  Goal Directed and Descriptions of Associations: Intact  Orientation:  Full (Time, Place, and Person)  Thought Content: Logical   Suicidal Thoughts:  No  Homicidal Thoughts:  No  Memory:  Immediate;   Fair Recent;   Fair Remote;   Fair  Judgement:  Fair  Insight:  Fair  Psychomotor Activity:  Normal  Concentration:  Concentration: Fair and Attention Span: Fair  Recall:  [employer] of Knowledge: Fair  Language: Fair  Akathisia:  No  Handed:  Right  AIMS (if  indicated): not done  Assets:  Communication Skills Desire for Improvement Social Support Transportation  ADL's:  Intact  Cognition: WNL  Sleep:  Fair   Screenings: AIMS    Flowsheet Row Video Visit from 12/08/2021 in [facility]  AIMS Total Score 0   GAD-7    Flowsheet Row Office Visit from 03/18/2024 in [facility] Office Visit from 04/11/2023 in [facility] Office Visit from 11/21/2022 in [facility] Video Visit from 03/28/2022 in [facility] Video Visit from 02/06/2022 in [facility]  Total GAD-7 Score 21 21 14 10 7    PHQ2-9    Flowsheet Row Office Visit from 05/23/2024 in [facility] Dept Of [place]. [facility] Office Visit from 05/14/2024 in [facility] Dept Of [place]. [facility] Office Visit from 04/18/2024 in [facility] Dept Of [place]. [facility] Office Visit from 03/25/2024 in [facility] Dept Of [place]. [facility] Office Visit from 03/18/2024 in [facility]  PHQ-2 Total Score 3 0 6 2 3   PHQ-9 Total Score -- 0 15 18 18    Flowsheet Row Video Visit from 05/26/2024 in [facility] Office Visit from 05/23/2024 in [facility] Dept Of [place]. [facility] Office Visit from 05/14/2024 in [facility] Dept Of [place]. [facility]  C-SSRS RISK CATEGORY No Risk No Risk No Risk     Assessment and Plan: JAXON MYNHIER is a 63 year old Caucasian female who presented for a follow-up appointment, discussed assessment and plan as noted below.  1. Bipolar 1 disorder, depressed, mild  (HCC)-unstable Currently struggling with low mood mostly related to recent diagnosis of cancer and upcoming chemotherapy. Increase Lamotrigine  to 225 mg daily  2. GAD (generalized anxiety disorder)-unstable Current anxiety mostly related to recent cancer diagnosis.  Currently in psychotherapy with Ms. Ellouise Hummer and that has been beneficial. Encouraged to continue psychotherapy with Ms. Ellouise Hummer I have coordinated care. Continue Lexapro  15 mg daily Continue BuSpar  30 mg in divided dosage with option to take an extra 10 mg as needed for severe anxiety  3. Insomnia due to medical condition-stable Currently denies any significant sleep problems although does have fatigue mostly due to recent cancer diagnosis. Continue Temazepam  30 mg at bedtime, could hold if excessive fatigue/sleepiness. Reviewed [place] PMP AWARxE Renew Prazosin  2 mg at bedtime for nightmares Continue Requip  0.25 mg 3 times a day   Follow-up Follow-up in clinic in 2 to 3 weeks or sooner if needed.    Collaboration of Care: Collaboration of Care: Referral or follow-up with counselor/therapist  AEB and reads to continue psychotherapy sessions.  Patient/Guardian was advised Release of Information must be obtained prior to any record release in order to collaborate their care with an outside provider. Patient/Guardian was advised if they have not already done so to contact the registration department to sign all necessary forms in order for us  to release information regarding their care.   Consent: Patient/Guardian gives verbal consent for treatment and assignment of benefits for services provided during this visit. Patient/Guardian expressed understanding and agreed to proceed.   This note was generated in part or whole with voice recognition software. Voice recognition is usually quite accurate but there are transcription errors that can and very often do occur. I apologize for any typographical errors that were not  detected and corrected.    Irini Leet, MD 05/27/2024, 2:39 PM

## 2024-05-26 NOTE — Progress Notes (Signed)
 Pharmacist Chemotherapy Monitoring - Initial Assessment    Anticipated start date: 11/18//25   The following has been reviewed per standard work regarding the patient's treatment regimen: The patient's diagnosis, treatment plan and drug doses, and organ/hematologic function Lab orders and baseline tests specific to treatment regimen  The treatment plan start date, drug sequencing, and pre-medications Prior authorization status  Patient's documented medication list, including drug-drug interaction screen and prescriptions for anti-emetics and supportive care specific to the treatment regimen The drug concentrations, fluid compatibility, administration routes, and timing of the medications to be used The patient's access for treatment and lifetime cumulative dose history, if applicable  The patient's medication allergies and previous infusion related reactions, if applicable  Low grade B cell lymphoproliferative disorder (HCC)  Bendamustine-rituximab-Bendamustine day 2 every 28-day cycle.  Changes made to treatment plan:  N/A  Follow up needed:  Pending authorization for treatment    Redell JINNY Gaskins, [facility], 05/26/2024  3:18 PM

## 2024-05-27 ENCOUNTER — Encounter: Payer: Self-pay | Admitting: Internal Medicine

## 2024-05-29 ENCOUNTER — Encounter: Payer: Self-pay | Admitting: Internal Medicine

## 2024-06-02 ENCOUNTER — Inpatient Hospital Stay

## 2024-06-02 ENCOUNTER — Encounter: Payer: Self-pay | Admitting: *Deleted

## 2024-06-02 ENCOUNTER — Encounter: Payer: Self-pay | Admitting: Nurse Practitioner

## 2024-06-02 ENCOUNTER — Inpatient Hospital Stay: Admitting: Nurse Practitioner

## 2024-06-02 ENCOUNTER — Encounter: Payer: Self-pay | Admitting: Internal Medicine

## 2024-06-02 VITALS — BP 100/64 | HR 79 | Temp 97.9°F | Ht 64.0 in | Wt 155.2 lb

## 2024-06-02 VITALS — BP 110/64 | HR 86 | Temp 98.0°F | Resp 18

## 2024-06-02 DIAGNOSIS — D47Z9 Other specified neoplasms of uncertain behavior of lymphoid, hematopoietic and related tissue: Secondary | ICD-10-CM

## 2024-06-02 DIAGNOSIS — C9100 Acute lymphoblastic leukemia not having achieved remission: Secondary | ICD-10-CM | POA: Diagnosis not present

## 2024-06-02 DIAGNOSIS — Z5111 Encounter for antineoplastic chemotherapy: Secondary | ICD-10-CM | POA: Diagnosis not present

## 2024-06-02 LAB — CBC WITH DIFFERENTIAL (CANCER CENTER ONLY)
Abs Immature Granulocytes: 0.01 K/uL (ref 0.00–0.07)
Basophils Absolute: 0.1 K/uL (ref 0.0–0.1)
Basophils Relative: 1 %
Eosinophils Absolute: 0.2 K/uL (ref 0.0–0.5)
Eosinophils Relative: 4 %
HCT: 34.3 % — ABNORMAL LOW (ref 36.0–46.0)
Hemoglobin: 10.8 g/dL — ABNORMAL LOW (ref 12.0–15.0)
Immature Granulocytes: 0 %
Lymphocytes Relative: 24 %
Lymphs Abs: 1.2 K/uL (ref 0.7–4.0)
MCH: 25.4 pg — ABNORMAL LOW (ref 26.0–34.0)
MCHC: 31.5 g/dL (ref 30.0–36.0)
MCV: 80.7 fL (ref 80.0–100.0)
Monocytes Absolute: 0.8 K/uL (ref 0.1–1.0)
Monocytes Relative: 16 %
Neutro Abs: 2.8 K/uL (ref 1.7–7.7)
Neutrophils Relative %: 55 %
Platelet Count: 261 K/uL (ref 150–400)
RBC: 4.25 MIL/uL (ref 3.87–5.11)
RDW: 15.2 % (ref 11.5–15.5)
WBC Count: 5 K/uL (ref 4.0–10.5)
nRBC: 0 % (ref 0.0–0.2)

## 2024-06-02 LAB — CMP (CANCER CENTER ONLY)
ALT: 15 U/L (ref 0–44)
AST: 24 U/L (ref 15–41)
Albumin: 4.4 g/dL (ref 3.5–5.0)
Alkaline Phosphatase: 84 U/L (ref 38–126)
Anion gap: 11 (ref 5–15)
BUN: 17 mg/dL (ref 8–23)
CO2: 25 mmol/L (ref 22–32)
Calcium: 9.4 mg/dL (ref 8.9–10.3)
Chloride: 107 mmol/L (ref 98–111)
Creatinine: 1.24 mg/dL — ABNORMAL HIGH (ref 0.44–1.00)
GFR, Estimated: 48 mL/min — ABNORMAL LOW (ref >60)
Glucose, Bld: 103 mg/dL — ABNORMAL HIGH (ref 70–99)
Potassium: 3.8 mmol/L (ref 3.5–5.1)
Sodium: 143 mmol/L (ref 135–145)
Total Bilirubin: 0.3 mg/dL (ref 0.0–1.2)
Total Protein: 6.2 g/dL — ABNORMAL LOW (ref 6.5–8.1)

## 2024-06-02 MED ORDER — ACYCLOVIR 400 MG PO TABS: 400.0000 mg | ORAL_TABLET | Freq: Every day | ORAL | 0 refills | Status: AC

## 2024-06-02 MED ORDER — DEXAMETHASONE 4 MG PO TABS: 8.0000 mg | ORAL_TABLET | Freq: Every day | ORAL | 0 refills | Status: AC

## 2024-06-02 MED ORDER — ACETAMINOPHEN 325 MG PO TABS
650.0000 mg | ORAL_TABLET | Freq: Once | ORAL | Status: AC
  Administered 2024-06-02: 650 mg via ORAL
  Filled 2024-06-02: qty 2

## 2024-06-02 MED ORDER — PALONOSETRON HCL INJECTION 0.25 MG/5ML
0.2500 mg | Freq: Once | INTRAVENOUS | Status: AC
  Administered 2024-06-02: 0.25 mg via INTRAVENOUS
  Filled 2024-06-02: qty 5

## 2024-06-02 MED ORDER — SODIUM CHLORIDE 0.9 % IV SOLN
90.0000 mg/m2 | Freq: Once | INTRAVENOUS | Status: AC
  Administered 2024-06-02: 160 mg via INTRAVENOUS
  Filled 2024-06-02: qty 6.4

## 2024-06-02 MED ORDER — DEXAMETHASONE SOD PHOSPHATE PF 10 MG/ML IJ SOLN
10.0000 mg | Freq: Once | INTRAMUSCULAR | Status: AC
  Administered 2024-06-02: 10 mg via INTRAVENOUS

## 2024-06-02 MED ORDER — SODIUM CHLORIDE 0.9 % IV SOLN
INTRAVENOUS | Status: DC
  Filled 2024-06-02: qty 250

## 2024-06-02 MED ORDER — ALLOPURINOL 300 MG PO TABS: 300.0000 mg | ORAL_TABLET | Freq: Every day | ORAL | 1 refills | Status: AC

## 2024-06-02 MED ORDER — SODIUM CHLORIDE 0.9 % IV SOLN
375.0000 mg/m2 | Freq: Once | INTRAVENOUS | Status: AC
  Administered 2024-06-02: 700 mg via INTRAVENOUS
  Filled 2024-06-02: qty 20

## 2024-06-02 MED ORDER — DIPHENHYDRAMINE HCL 25 MG PO CAPS
50.0000 mg | ORAL_CAPSULE | Freq: Once | ORAL | Status: AC
  Administered 2024-06-02: 50 mg via ORAL
  Filled 2024-06-02: qty 2

## 2024-06-02 NOTE — Patient Instructions (Signed)
 [facility] - A DEPT OF [facility]  Discharge Instructions: Thank you for choosing [facility] to provide your oncology and hematology care.  If you have a lab appointment with the Cancer Center, please go directly to the Cancer Center and check in at the registration area.  Wear comfortable clothing and clothing appropriate for easy access to any Portacath or PICC line.   We strive to give you quality time with your provider. You may need to reschedule your appointment if you arrive late (15 or more minutes).  Arriving late affects you and other patients whose appointments are after yours.  Also, if you miss three or more appointments without notifying the office, you may be dismissed from the clinic at the provider's discretion.      For prescription refill requests, have your pharmacy contact our office and allow 72 hours for refills to be completed.    Today you received the following chemotherapy and/or immunotherapy agents rituximab    To help prevent nausea and vomiting after your treatment, we encourage you to take your nausea medication as directed.  BELOW ARE SYMPTOMS THAT SHOULD BE REPORTED IMMEDIATELY: *FEVER GREATER THAN 100.4 F (38 C) OR HIGHER *CHILLS OR SWEATING *NAUSEA AND VOMITING THAT IS NOT CONTROLLED WITH YOUR NAUSEA MEDICATION *UNUSUAL SHORTNESS OF BREATH *UNUSUAL BRUISING OR BLEEDING *URINARY PROBLEMS (pain or burning when urinating, or frequent urination) *BOWEL PROBLEMS (unusual diarrhea, constipation, pain near the anus) TENDERNESS IN MOUTH AND THROAT WITH OR WITHOUT PRESENCE OF ULCERS (sore throat, sores in mouth, or a toothache) UNUSUAL RASH, SWELLING OR PAIN  UNUSUAL VAGINAL DISCHARGE OR ITCHING   Items with * indicate a potential emergency and should be followed up as soon as possible or go to the Emergency Department if any problems should occur.  Please show the CHEMOTHERAPY ALERT CARD or IMMUNOTHERAPY  ALERT CARD at check-in to the Emergency Department and triage nurse.  Should you have questions after your visit or need to cancel or reschedule your appointment, please contact [facility] - A DEPT OF [facility]  [phone] and follow the prompts.  Office hours are 8:00 a.m. to 4:30 p.m. Monday - Friday. Please note that voicemails left after 4:00 p.m. may not be returned until the following business day.  We are closed weekends and major holidays. You have access to a nurse at all times for urgent questions. Please call the main number to the clinic [phone] and follow the prompts.  For any non-urgent questions, you may also contact your provider using MyChart. We now offer e-Visits for anyone 28 and older to request care online for non-urgent symptoms. For details visit [email or web address].   Also download the MyChart app! Go to the app store, search "MyChart", open the app, select [place], and log in with your MyChart username and password.  Rituximab Injection What is this medication? RITUXIMAB (ri TUX i mab) treats leukemia and lymphoma. It works by blocking a protein that causes cancer cells to grow and multiply. This helps to slow or stop the spread of cancer cells. It may also be used to treat autoimmune conditions, such as arthritis. It works by slowing down an overactive immune system. It is a monoclonal antibody. This medicine may be used for other purposes; ask your health care provider or pharmacist if you have questions. COMMON BRAND NAME(S): RIABNI, Rituxan, RUXIENCE, truxima What should I tell my care team before  I take this medication? They need to know if you have any of these conditions: Chest pain Heart disease Immune system problems Infection, such as chickenpox, cold sores, hepatitis B, herpes Irregular heartbeat or rhythm Kidney disease Low blood counts, such as low white cells, platelets, red cells Lung disease Recent or  upcoming vaccine An unusual or allergic reaction to rituximab, other medications, foods, dyes, or preservatives Pregnant or trying to get pregnant Breast-feeding How should I use this medication? This medication is injected into a vein. It is given by a care team in a hospital or clinic setting. A special MedGuide will be given to you before each treatment. Be sure to read this information carefully each time. Talk to your care team about the use of this medication in children. While this medication may be prescribed for children as young as 6 months for selected conditions, precautions do apply. Overdosage: If you think you have taken too much of this medicine contact a poison control center or emergency room at once. NOTE: This medicine is only for you. Do not share this medicine with others. What if I miss a dose? Keep appointments for follow-up doses. It is important not to miss your dose. Call your care team if you are unable to keep an appointment. What may interact with this medication? Do not take this medication with any of the following: Live vaccines This medication may also interact with the following: Cisplatin This list may not describe all possible interactions. Give your health care provider a list of all the medicines, herbs, non-prescription drugs, or dietary supplements you use. Also tell them if you smoke, drink alcohol, or use illegal drugs. Some items may interact with your medicine. What should I watch for while using this medication? Your condition will be monitored carefully while you are receiving this medication. You may need blood work while taking this medication. This medication can cause serious infusion reactions. To reduce the risk your care team may give you other medications to take before receiving this one. Be sure to follow the directions from your care team. This medication may increase your risk of getting an infection. Call your care team for advice if  you get a fever, chills, sore throat, or other symptoms of a cold or flu. Do not treat yourself. Try to avoid being around people who are sick. Call your care team if you are around anyone with measles, chickenpox, or if you develop sores or blisters that do not heal properly. Avoid taking medications that contain aspirin, acetaminophen, ibuprofen, naproxen, or ketoprofen unless instructed by your care team. These medications may hide a fever. This medication may cause serious skin reactions. They can happen weeks to months after starting the medication. Contact your care team right away if you notice fevers or flu-like symptoms with a rash. The rash may be red or purple and then turn into blisters or peeling of the skin. You may also notice a red rash with swelling of the face, lips, or lymph nodes in your neck or under your arms. In some patients, this medication may cause a serious brain infection that may cause death. If you have any problems seeing, thinking, speaking, walking, or standing, tell your care team right away. If you cannot reach your care team, urgently seek another source of medical care. Talk to your care team if you may be pregnant. Serious birth defects can occur if you take this medication during pregnancy and for 12 months after the last dose.  You will need a negative pregnancy test before starting this medication. Contraception is recommended while taking this medication and for 12 months after the last dose. Your care team can help you find the option that works for you. Do not breastfeed while taking this medication and for at least 6 months after the last dose. What side effects may I notice from receiving this medication? Side effects that you should report to your care team as soon as possible: Allergic reactions or angioedema--skin rash, itching or hives, swelling of the face, eyes, lips, tongue, arms, or legs, trouble swallowing or breathing Bowel blockage--stomach cramping,  unable to have a bowel movement or pass gas, loss of appetite, vomiting Dizziness, loss of balance or coordination, confusion or trouble speaking Heart attack--pain or tightness in the chest, shoulders, arms, or jaw, nausea, shortness of breath, cold or clammy skin, feeling faint or lightheaded Heart rhythm changes--fast or irregular heartbeat, dizziness, feeling faint or lightheaded, chest pain, trouble breathing Infection--fever, chills, cough, sore throat, wounds that don't heal, pain or trouble when passing urine, general feeling of discomfort or being unwell Infusion reactions--chest pain, shortness of breath or trouble breathing, feeling faint or lightheaded Kidney injury--decrease in the amount of urine, swelling of the ankles, hands, or feet Liver injury--right upper belly pain, loss of appetite, nausea, light-colored stool, dark yellow or brown urine, yellowing skin or eyes, unusual weakness or fatigue Redness, blistering, peeling, or loosening of the skin, including inside the mouth Stomach pain that is severe, does not go away, or gets worse Tumor lysis syndrome (TLS)--nausea, vomiting, diarrhea, decrease in the amount of urine, dark urine, unusual weakness or fatigue, confusion, muscle pain or cramps, fast or irregular heartbeat, joint pain Side effects that usually do not require medical attention (report to your care team if they continue or are bothersome): Headache Joint pain Nausea Runny or stuffy nose Unusual weakness or fatigue This list may not describe all possible side effects. Call your doctor for medical advice about side effects. You may report side effects to FDA at 1-800-FDA-1088. Where should I keep my medication? This medication is given in a hospital or clinic. It will not be stored at home. NOTE: This sheet is a summary. It may not cover all possible information. If you have questions about this medicine, talk to your doctor, pharmacist, or health care provider.   2024 [employer]/Gold Standard (2021-11-24 00:00:00)

## 2024-06-02 NOTE — Progress Notes (Signed)
 [facility] CONSULT NOTE  Patient Care Team: Alla Amis, [name], [name], [name], [name]  CHIEF COMPLAINTS/PURPOSE OF CONSULTATION: Lymphoma   HEMATOLOGY HISTORY  # ANEMIA[Hb; MCV-platelets- WBC; Iron  sat; ferritin;  GFR- CT/US ; EGD/colonoscopy-  Oncology History   No history exists.   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  Coexpression of CLL.  MYD 88 pending OCT 2025-PET scan: extensive hypermetabolic perilymphatic nodularity with nodular and masslike areas of consolidation in the lungs as well as borderline hypermetabolic mediastinal lymph nodes and hypermetabolic abdominal retroperitoneal and mesenteric lymph nodes.   # NOV 17th, 2025-Bendamustine-rituximab-Bendamustine day 2 every 28-day cycle.    HISTORY OF PRESENTING ILLNESS: Patient ambulating-independently. With husband.    Kaylee Gordon 63 y.o.  female with  pleasant patient with history of bipolar disorder/fibromyalgia with iron  deficiency anemia from  hiatal hernia-and abnormal Ig M-and kappa lambda light chain ratio-and a new diagnosis of lymphoma status post retroperitoneal lymph node biopsy/bone marrow biopsy is here to start treatment for low-grade B-cell lymphoma.     Patient continues to complain of cough and difficulty breathing with exertion.  No significant night sweats or significant weight loss.  She does endorse some anxiety about starting today's treatment.  We reviewed side effects, symptom management clinic, as well as her medications to take at home.  Instructed patient on at home prophylactic medications (acyclovir, allopurinol, and decadron ) and called into pharmacy today.   Review of Systems   Constitutional:  Positive for malaise/fatigue. Negative for chills, diaphoresis, fever and weight loss.  HENT:  Negative for nosebleeds and sore throat.   Eyes:  Negative for double vision.  Respiratory:  Positive for cough. Negative for hemoptysis, sputum production, shortness of breath and wheezing.   Cardiovascular:  Negative for chest pain, palpitations, orthopnea and leg swelling.  Gastrointestinal:  Negative for abdominal pain, blood in stool, constipation, diarrhea, heartburn, melena, nausea and vomiting.  Genitourinary:  Negative for dysuria, frequency and urgency.  Musculoskeletal:  Positive for back pain and joint pain.  Skin: Negative.  Negative for itching and rash.  Neurological:  Negative for dizziness, tingling, focal weakness, weakness and headaches.  Endo/Heme/Allergies:  Does not bruise/bleed easily.  Psychiatric/Behavioral:  Negative for depression. The patient is not nervous/anxious and does not have insomnia.      MEDICAL HISTORY:  Past Medical History:  Diagnosis Date   Anxiety    Asthma    B12 deficiency    Bipolar disorder (HCC)    Borderline diabetes    CHF (congestive heart failure) (HCC)    Colon polyps    Depression    Dyspnea    Family history of adverse reaction to anesthesia    mom and dad-delayed emergence   Fibromyalgia    Hilar adenopathy    History of hiatal hernia    History of melanoma    History of methicillin resistant staphylococcus aureus (MRSA) 2010   Hypertension    Hypoparathyroidism    Hypothyroidism    IDA (iron  deficiency anemia)    Lung nodules    Mediastinal adenopathy    PONV (postoperative nausea and vomiting)    delayed emergence   Prolonged Q-T interval on ECG    Thyroid  disease     SURGICAL HISTORY: Past Surgical History:  Procedure Laterality Date  ANKLE SURGERY Right 2012   APPENDECTOMY     AUGMENTATION MAMMAPLASTY Bilateral    CESAREAN SECTION     x2   COLONOSCOPY WITH PROPOFOL  N/A 02/22/2015    Procedure: COLONOSCOPY WITH PROPOFOL ;  Surgeon: Donnice Vaughn Manes, MD;  Location: [facility] ENDOSCOPY;  Service: Endoscopy;  Laterality: N/A;   COLONOSCOPY WITH PROPOFOL  N/A 08/08/2022   Procedure: COLONOSCOPY WITH PROPOFOL ;  Surgeon: Maryruth Ole DASEN, MD;  Location: [facility] ENDOSCOPY;  Service: Endoscopy;  Laterality: N/A;   ESOPHAGOGASTRODUODENOSCOPY (EGD) WITH PROPOFOL  N/A 02/22/2015   Procedure: ESOPHAGOGASTRODUODENOSCOPY (EGD) WITH PROPOFOL ;  Surgeon: Donnice Vaughn Manes, MD;  Location: [facility] ENDOSCOPY;  Service: Endoscopy;  Laterality: N/A;   ESOPHAGOGASTRODUODENOSCOPY (EGD) WITH PROPOFOL  N/A 02/11/2016   Procedure: ESOPHAGOGASTRODUODENOSCOPY (EGD) WITH PROPOFOL ;  Surgeon: Lamar DASEN Holmes, MD;  Location: [facility] ENDOSCOPY;  Service: Endoscopy;  Laterality: N/A;   ESOPHAGOGASTRODUODENOSCOPY (EGD) WITH PROPOFOL  N/A 08/08/2022   Procedure: ESOPHAGOGASTRODUODENOSCOPY (EGD) WITH PROPOFOL ;  Surgeon: Maryruth Ole DASEN, MD;  Location: [facility] ENDOSCOPY;  Service: Endoscopy;  Laterality: N/A;   HEMORRHOID SURGERY     IR IMAGING GUIDED PORT INSERTION  05/22/2024   NASAL SINUS SURGERY     SAVORY DILATION N/A 02/22/2015   Procedure: SAVORY DILATION;  Surgeon: Donnice Vaughn Manes, MD;  Location: [facility] ENDOSCOPY;  Service: Endoscopy;  Laterality: N/A;   SAVORY DILATION N/A 02/11/2016   Procedure: SAVORY DILATION;  Surgeon: Lamar DASEN Holmes, MD;  Location: [facility] ENDOSCOPY;  Service: Endoscopy;  Laterality: N/A;   SKIN CANCER EXCISION     VIDEO BRONCHOSCOPY WITH ENDOBRONCHIAL NAVIGATION N/A 04/11/2024   Procedure: VIDEO BRONCHOSCOPY WITH ENDOBRONCHIAL NAVIGATION;  Surgeon: Parris Manna, MD;  Location: [facility] ORS;  Service: Thoracic;  Laterality: N/A;   VIDEO BRONCHOSCOPY WITH ENDOBRONCHIAL ULTRASOUND N/A 04/11/2024   Procedure: BRONCHOSCOPY, WITH EBUS;  Surgeon: Parris Manna, MD;  Location: [facility] ORS;  Service: Thoracic;  Laterality: N/A;    SOCIAL HISTORY: Social History   Socioeconomic History   Marital  status: Married    Spouse name: Ozell   Number of children: Not on file   Years of education: Not on file   Highest education level: Not on file  Occupational History   Not on file  Tobacco Use   Smoking status: Never   Smokeless tobacco: Never  Vaping Use   Vaping status: Never Used  Substance and Sexual Activity   Alcohol use: Yes    Comment: rare wine   Drug use: No   Sexual activity: Not Currently    Partners: Male  Other Topics Concern   Not on file  Social History Narrative   Lives in [place]; taught high school- math; retd; no smoking; social alcohol.    Social Drivers of Corporate Investment Banker Strain: Low Risk  (08/07/2023)   Received from [facility]   Overall Financial Resource Strain (CARDIA)    Difficulty of Paying Living Expenses: Not hard at all  Food Insecurity: No Food Insecurity (02/25/2024)   Hunger Vital Sign    Worried About Running Out of Food in the Last Year: Never true    Ran Out of Food in the Last Year: Never true  Transportation Needs: No Transportation Needs (02/25/2024)   PRAPARE - Administrator, Civil Service (Medical): No    Lack of Transportation (Non-Medical): No  Physical Activity: Unknown (05/16/2018)   Exercise Vital Sign    Days of Exercise per Week: 0 days    Minutes of Exercise per Session: Not on file  Stress: Stress Concern Present (05/16/2018)   [employer] of Occupational Health - Occupational Stress Questionnaire    Feeling of Stress : Very much  Social Connections: Unknown (05/16/2018)   Social Connection and Isolation Panel    Frequency of Communication with Friends and Family: Twice a week    Frequency of Social Gatherings with Friends and Family: Twice a week    Attends Religious Services: More than 4 times per year    Active Member of [employer] or Organizations: Not on file    Attends Banker Meetings: Not on file    Marital Status: Married  Intimate Partner  Violence: Not At Risk (02/25/2024)   Humiliation, Afraid, Rape, and Kick questionnaire    Fear of Current or Ex-Partner: No    Emotionally Abused: No    Physically Abused: No    Sexually Abused: No    FAMILY HISTORY: Family History  Problem Relation Age of Onset   Cancer Mother        colon cancer   Depression Mother    Anxiety disorder Mother    Heart disease Father    Cancer Father        ? neck cancer- still living.    Anxiety disorder Father    Depression Father    Colon cancer Maternal Aunt        died in 25s.    Hypoparathyroidism Son     ALLERGIES:  is allergic to chlorzoxazone, levofloxacin, atorvastatin, hydrochlorothiazide, pollen extract, and pseudoephedrine.  MEDICATIONS:  Current Outpatient Medications  Medication Sig Dispense Refill   acyclovir (ZOVIRAX) 400 MG tablet Take 1 tablet (400 mg total) by mouth daily for 90 doses. 90 tablet 0   allopurinol (ZYLOPRIM) 300 MG tablet Take 1 tablet (300 mg total) by mouth daily for 180 doses. 90 tablet 1   busPIRone  (BUSPAR ) 10 MG tablet Take 4 tablets (40 mg total) by mouth as directed. TAKE 1 TABLET BY MOUTH 3 TIMES A DAY AND 1 EXTRA TABLET AS NEEDED FOR SEVERE ANXIETY 120 tablet 2   calcitRIOL  (ROCALTROL ) 0.25 MCG capsule Take 1 capsule (0.25 mcg total) by mouth daily. 90 capsule 3   dexamethasone  (DECADRON ) 4 MG tablet Take 2 tablets (8 mg total) by mouth daily for 2 days. 4 tablet 0   escitalopram  (LEXAPRO ) 10 MG tablet Take 1.5 tablets (15 mg total) by mouth daily. (Patient taking differently: Take 15 mg by mouth every morning.) 135 tablet 0   esomeprazole (NEXIUM) 40 MG capsule Take 1 capsule by mouth every morning.     fenofibrate 160 MG tablet Take 1 tablet by mouth daily.     fluticasone-salmeterol (ADVAIR) 250-50 MCG/ACT AEPB Inhale 1 puff into the lungs in the morning and at bedtime.     gabapentin  (NEURONTIN ) 300 MG capsule Take 300 mg by mouth See admin instructions. 2 CAP AM, 1 CAP Q noon, 2 CAP pm,      ipratropium (ATROVENT ) 0.03 % nasal spray Place 1 spray into both nostrils 2 (two) times daily.     isosorbide mononitrate (IMDUR) 30 MG 24 hr tablet Take 30 mg by mouth 2 (two) times daily.     lamoTRIgine  (LAMICTAL ) 200 MG tablet Take 0.5 tablets (100 mg total) by mouth 2 (two) times daily. 90 tablet 1   lamoTRIgine  (LAMICTAL ) 25 MG tablet Take 1 tablet (25 mg total) by mouth daily. Take along with 200 mg daily , total of 225 mg daily 30 tablet 1   levothyroxine  (SYNTHROID ) 75 MCG  tablet Take 1 tablet (75 mcg total) by mouth daily. (Patient taking differently: Take 75 mcg by mouth daily before breakfast.) 90 tablet 3   lidocaine -prilocaine (EMLA) cream Apply 1 Application topically as needed. 30 g 1   lisinopril (ZESTRIL) 10 MG tablet Take 10 mg by mouth daily.     lovastatin (MEVACOR) 20 MG tablet Take 20 mg by mouth at bedtime.     montelukast (SINGULAIR) 10 MG tablet Take 10 mg by mouth at bedtime.     ondansetron  (ZOFRAN ) 8 MG tablet Take 1 tablet (8 mg total) by mouth every 8 (eight) hours as needed for nausea or vomiting (nausea related to iron  infusions). 60 tablet 0   prazosin  (MINIPRESS ) 2 MG capsule Take 1 capsule (2 mg total) by mouth at bedtime. (Patient taking differently: Take 2 mg by mouth at bedtime. For Nightmares) 90 capsule 1   prochlorperazine (COMPAZINE) 10 MG tablet Take 1 tablet (10 mg total) by mouth every 6 (six) hours as needed for nausea or vomiting. 30 tablet 1   propranolol ER (INDERAL LA) 80 MG 24 hr capsule Take 80 mg by mouth every morning.     RESTASIS 0.05 % ophthalmic emulsion Place 1 drop into both eyes 2 (two) times daily.     rOPINIRole  (REQUIP ) 0.25 MG tablet TAKE 1 TABLET BY MOUTH 3 TIMES A DAY 90 tablet 2   temazepam  (RESTORIL ) 30 MG capsule Take 1 capsule (30 mg total) by mouth at bedtime. 30 capsule 2   TRYPTYR 0.003 % SOLN Apply 1 drop to eye 2 (two) times daily.     No current facility-administered medications for this visit.   Facility-Administered  Medications Ordered in Other Visits  Medication Dose Route Frequency Provider Last Rate Last Admin   0.9 %  sodium chloride  infusion   Intravenous Continuous Brahmanday, Govinda R, MD 10 mL/hr at 06/02/24 0936 New Bag at 06/02/24 0936   bendamustine (BENDEKA) 160 mg in sodium chloride  0.9 % 50 mL (2.8369 mg/mL) chemo infusion  90 mg/m2 (Treatment Plan Recorded) Intravenous Once Brahmanday, Govinda R, MD       PHYSICAL EXAMINATION:   Vitals:   06/02/24 0815  BP: 100/64  Pulse: 79  Temp: 97.9 F (36.6 C)  SpO2: 96%   Filed Weights   06/02/24 0815  Weight: 155 lb 3.2 oz (70.4 kg)     Physical Exam Vitals and nursing note reviewed.  HENT:     Head: Normocephalic and atraumatic.     Mouth/Throat:     Pharynx: Oropharynx is clear.  Eyes:     Extraocular Movements: Extraocular movements intact.     Pupils: Pupils are equal, round, and reactive to light.  Cardiovascular:     Rate and Rhythm: Normal rate and regular rhythm.  Pulmonary:     Comments: Decreased breath sounds bilaterally.  Abdominal:     Palpations: Abdomen is soft.  Musculoskeletal:        General: Normal range of motion.     Cervical back: Normal range of motion.  Skin:    General: Skin is warm.  Neurological:     General: No focal deficit present.     Mental Status: She is alert and oriented to person, place, and time.  Psychiatric:        Behavior: Behavior normal.        Judgment: Judgment normal.      LABORATORY DATA:  I have reviewed the data as listed Lab Results  Component Value Date  WBC 5.0 06/02/2024   HGB 10.8 (L) 06/02/2024   HCT 34.3 (L) 06/02/2024   MCV 80.7 06/02/2024   PLT 261 06/02/2024   Recent Labs    02/25/24 1501 05/23/24 1259 06/02/24 0810  NA 138 134* 143  K 4.0 3.7 3.8  CL 106 103 107  CO2 23 21* 25  GLUCOSE 89 127* 103*  BUN 24* 19 17  CREATININE 1.18* 1.06* 1.24*  CALCIUM 9.0 8.9 9.4  GFRNONAA 52* 59* 48*  PROT 6.8  --  6.2*  ALBUMIN 4.0  --  4.4  AST 22   --  24  ALT 12  --  15  ALKPHOS 71  --  84  BILITOT 0.5  --  0.3     IR IMAGING GUIDED PORT INSERTION Result Date: 05/22/2024 CLINICAL DATA:  Marginal zone B-cell lymphoma and need for porta cath for chemotherapy. EXAM: IMPLANTED PORT A CATH PLACEMENT WITH ULTRASOUND AND FLUOROSCOPIC GUIDANCE ANESTHESIA/SEDATION: Moderate (conscious) sedation was employed during this procedure. A total of Versed  2.0 mg and Fentanyl  100 mcg was administered intravenously. Moderate Sedation Time: 30 minutes. The patient's level of consciousness and vital signs were monitored continuously by radiology nursing throughout the procedure under my direct supervision. FLUOROSCOPY: Radiation Exposure Index: 2.0 CT mGy Kerma PROCEDURE: The procedure, risks, benefits, and alternatives were explained to the patient. Questions regarding the procedure were encouraged and answered. The patient understands and consents to the procedure. A time-out was performed prior to initiating the procedure. Ultrasound was utilized to confirm patency of the right internal jugular vein. An ultrasound image was saved and recorded. The right neck and chest were prepped with chlorhexidine  in a sterile fashion, and a sterile drape was applied covering the operative field. Maximum barrier sterile technique with sterile gowns and gloves were used for the procedure. Local anesthesia was provided with 1% lidocaine . After creating a small venotomy incision, a 21 gauge needle was advanced into the right internal jugular vein under direct, real-time ultrasound guidance. Ultrasound image documentation was performed. After securing guidewire access, an 8 Fr dilator was placed. A J-wire was kinked to measure appropriate catheter length. A subcutaneous port pocket was then created along the upper chest wall utilizing sharp and blunt dissection. Portable cautery was utilized. The pocket was irrigated with sterile saline. A single lumen power injectable port was chosen  for placement. The 8 Fr catheter was tunneled from the port pocket site to the venotomy incision. The port was placed in the pocket. External catheter was trimmed to appropriate length based on guidewire measurement. At the venotomy, an 8 Fr peel-away sheath was placed over a guidewire. The catheter was then placed through the sheath and the sheath removed. Final catheter positioning was confirmed and documented with a fluoroscopic spot image. The port was accessed with a needle and aspirated and flushed with heparinized saline. The access needle was removed. The venotomy and port pocket incisions were closed with subcutaneous 3-0 Monocryl and subcuticular 4-0 Vicryl. Dermabond was applied to both incisions. COMPLICATIONS: COMPLICATIONS None FINDINGS: After catheter placement, the tip lies at the cavo-atrial junction. The catheter aspirates normally and is ready for immediate use. IMPRESSION: Placement of single lumen port a cath via right internal jugular vein. The catheter tip lies at the cavo-atrial junction. A power injectable port a cath was placed and is ready for immediate use. Electronically Signed   By: Marcey Moan M.D.   On: 05/22/2024 14:29   CT ABDOMINAL MASS BIOPSY Result Date: 05/08/2024 INDICATION: Central  abdominal mass. EXAM: CT-guided biopsy TECHNIQUE: Multidetector CT imaging of the abdomen was performed following the standard protocol without IV contrast. RADIATION DOSE REDUCTION: This exam was performed according to the departmental dose-optimization program which includes automated exposure control, adjustment of the mA and/or kV according to patient size and/or use of iterative reconstruction technique. MEDICATIONS: None. ANESTHESIA/SEDATION: Moderate (conscious) sedation was employed during this procedure. A total of Versed  1.5 mg and Fentanyl  75 mcg was administered intravenously by the radiology nurse. Total intra-service moderate Sedation Time: 57 minutes. The patient's level of  consciousness and vital signs were monitored continuously by radiology nursing throughout the procedure under my direct supervision. COMPLICATIONS: None immediate. PROCEDURE: Informed written consent was obtained from the patient after a thorough discussion of the procedural risks, benefits and alternatives. All questions were addressed. Maximal Sterile Barrier Technique was utilized including caps, mask, sterile gowns, sterile gloves, sterile drape, hand hygiene and skin antiseptic. A timeout was performed prior to the initiation of the procedure. In a supine position radiopaque markers were placed on the patient's skin and axial images were obtained. The central mass is again identified. The patient's skin was marked, prepped, and draped in the usual sterile fashion. A 17 gauge introducer was advanced through a small incision near the umbilicus and advanced into the peritoneal cavity. Position was suboptimal. The needle was withdrawn and readvanced into the lesion in a more satisfactory location. Once the needle was verified by reimaging to be within the lesion, 2 core samples were obtained. After the core samples were obtained, the lesion was hemorrhaging and therefore Gel-Foam was injected into the introducer. Final image was obtained demonstrating no hemorrhage. Sterile dressing was applied. IMPRESSION: Satisfactory core needle biopsy of a central abdominal mass. Electronically Signed   By: Cordella Banner   On: 05/08/2024 08:55   CT BONE MARROW BIOPSY & ASPIRATION Result Date: 05/08/2024 CLINICAL DATA:  Iron -deficiency anemia. EXAM: CT-guided bone marrow biopsy TECHNIQUE: CT pelvis CONTRAST:  None RADIOPHARMACEUTICALS:  None COMPARISON:  None FINDINGS: The patient was placed in prone position on the CT gantry. Radiopaque markers were placed on the patient's skin and initial imaging of the pelvis was performed. The patient's skin was then prepped and draped in the usual sterile fashion. Moderate sedation  was provided for by the nursing staff under my supervision utilizing intravenous Versed  and fentanyl . The nurse had no other duties other than monitoring the patient and providing sedation during the procedure. I was present for the entire procedure. 1.5 mg intravenous Versed  and 75 mcg of intravenous fentanyl  for a total sedation time of 57 minutes. 1% lidocaine  was used to infiltrate the skin at the access site prior to a stab incision. Local anesthesia was then used to infiltrate the region of soft tissue from the skin to the right iliac bone. The bone marrow needle was then advanced and imaging demonstrated the needle tip to be in the cortex of the right iliac bone. The bone was then penetrated and a sample was obtained. After the sample was evaluated, approximately 5 mL of heparinized bone marrow sample was obtained by aspiration. A core sample was then obtained. Multiple attempts at sampling was performed in order to get 2 1 cm segments. All needles were then removed from the patient. Sterile dressing was applied. IMPRESSION: Satisfactory core needle biopsy and aspiration of the right iliac bone marrow under CT guidance. 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  Coexpression of CLL.  MYD 88 pending OCT 2025-PET scan: extensive hypermetabolic perilymphatic nodularity with nodular and masslike areas of consolidation in the lungs as well as borderline hypermetabolic mediastinal lymph nodes and hypermetabolic abdominal retroperitoneal and mesenteric lymph nodes.    -Proceed with Bendamustine-rituximab-Bendamustine day 2 every 28-day cycle.  I reviewed at length the individual components with chemotherapy; and the schedule in detail.  I also discussed  the potential side effects including but not limited to-increasing fatigue, nausea vomiting, diarrhea,sores in the mouth, increase risk of infection. Also reviewed the multiple strategies to avoid/mitigate similar side effects including preemptive medications-for nausea vomiting.  Also discussed at length regarding good oral hygiene/hydration and in general precautions regarding duration of infections.  Less likely patient will have hair loss.  Again low risk of neuropathy.  Sent acyclovir, allopurinol, and decadron  to pharmacy for patient to start taking at home.  Instructed patient on proper use.   -reviewed with patient that treatments are palliative/to control the disease unfortunately not curable.  However given the low-grade lymphoma suspect fairly long remission rates in order for years.  She verbalizes understanding.   -Iron  deficiency anemia- ferritin 9, iron  47, iron  saturations 10  Hiatal hernia.  Monitor for now Hg 10.8 today appears to be ranging 10-11.  Unable to tolerate venofer  caused severe GI upset.  Insurance will not approve fereheme.  Continue to monitor    -Anxiety: Monitor closely on chemotherapy.  Stable today continue to monitor   F/U Plan: Proceed with benda-Ritux with D2 benda  F/U as scheduled LP      Morna Husband, NP 06/02/2024 10:44 AM

## 2024-06-03 ENCOUNTER — Inpatient Hospital Stay

## 2024-06-03 ENCOUNTER — Telehealth: Payer: Self-pay

## 2024-06-03 VITALS — BP 109/61 | HR 76 | Temp 96.6°F | Resp 16

## 2024-06-03 DIAGNOSIS — D47Z9 Other specified neoplasms of uncertain behavior of lymphoid, hematopoietic and related tissue: Secondary | ICD-10-CM

## 2024-06-03 DIAGNOSIS — Z5111 Encounter for antineoplastic chemotherapy: Secondary | ICD-10-CM | POA: Diagnosis not present

## 2024-06-03 MED ORDER — SODIUM CHLORIDE 0.9 % IV SOLN
INTRAVENOUS | Status: DC
  Filled 2024-06-03: qty 250

## 2024-06-03 MED ORDER — DEXAMETHASONE SOD PHOSPHATE PF 10 MG/ML IJ SOLN
10.0000 mg | Freq: Once | INTRAMUSCULAR | Status: AC
  Administered 2024-06-03: 10 mg via INTRAVENOUS

## 2024-06-03 MED ORDER — SODIUM CHLORIDE 0.9 % IV SOLN
90.0000 mg/m2 | Freq: Once | INTRAVENOUS | Status: AC
  Administered 2024-06-03: 160 mg via INTRAVENOUS
  Filled 2024-06-03: qty 6.4

## 2024-06-03 NOTE — Telephone Encounter (Signed)
Telephone call to patient for follow up after receiving first infusion.   No answer but left message stating we were calling to check on them.  Encouraged patient to call for any questions or concerns.   

## 2024-06-06 ENCOUNTER — Other Ambulatory Visit: Payer: Self-pay | Admitting: *Deleted

## 2024-06-06 DIAGNOSIS — R051 Acute cough: Secondary | ICD-10-CM

## 2024-06-06 MED ORDER — BENZONATATE 100 MG PO CAPS: 100.0000 mg | ORAL_CAPSULE | Freq: Three times a day (TID) | ORAL | 0 refills | Status: DC | PRN

## 2024-06-06 NOTE — Telephone Encounter (Signed)
 Morna, NP requested that I call patient to follow-up on her cough and to sch an smc apt with Josh on Monday. Pt accepted an apt for 1130 am. She reports a dry cough. Patient wants to know if she can have rx for tessalon  pearles. She stated that this has worked for her in the past. She is concerned that otc meds for cough could elevate her bp. Discussed that otc delysm may be another alternative to otc meds for mgmt of cough. I will send msg to APP re: Tessalon .

## 2024-06-09 ENCOUNTER — Inpatient Hospital Stay (HOSPITAL_BASED_OUTPATIENT_CLINIC_OR_DEPARTMENT_OTHER): Admitting: Hospice and Palliative Medicine

## 2024-06-09 ENCOUNTER — Encounter: Payer: Self-pay | Admitting: Hospice and Palliative Medicine

## 2024-06-09 VITALS — BP 111/66 | HR 76 | Temp 99.2°F | Resp 18 | Ht 64.0 in | Wt 153.0 lb

## 2024-06-09 DIAGNOSIS — R051 Acute cough: Secondary | ICD-10-CM | POA: Diagnosis not present

## 2024-06-09 DIAGNOSIS — Z5111 Encounter for antineoplastic chemotherapy: Secondary | ICD-10-CM | POA: Diagnosis not present

## 2024-06-09 LAB — RESPIRATORY PANEL BY PCR

## 2024-06-09 MED ORDER — AMOXICILLIN 500 MG PO CAPS: 500.0000 mg | ORAL_CAPSULE | Freq: Two times a day (BID) | ORAL | 0 refills | Status: DC

## 2024-06-09 MED ORDER — BENZONATATE 100 MG PO CAPS
100.0000 mg | ORAL_CAPSULE | Freq: Three times a day (TID) | ORAL | 0 refills | Status: AC | PRN
Start: 2024-06-09 — End: ?

## 2024-06-09 NOTE — Progress Notes (Signed)
 C/o cough, sore throat and upset stomach since Friday, ears feel stuffy. Benzonatate  does help. Pt states he O2 dropped to 77 and 80s Saturday. Having diarrhea, imodium not helping.

## 2024-06-09 NOTE — Progress Notes (Signed)
 Symptom Management Clinic [facility] Telephone:([phone] Fax:(336) [phone]  Patient Care Team: Alla Amis, [name], [name], [name], [name]   NAME OF PATIENT: Kaylee Gordon  [id number]  1960-10-08   DATE OF VISIT: 06/09/24  REASON FOR CONSULT: Kaylee Gordon is a 63 y.o. female with multiple medical problems including bipolar/fibromyalgia with history of IDA, recently diagnosed low-grade B-cell lymphoma.   INTERVAL HISTORY: Patient status post cycle 1 Bendamustine -rituximab  on 06/02/2024.  [facility] with complaint of cough, congestion, rhinorrhea, sore throat for 4-5 days.  Patient reports subjective fever.  Denies chills.  Says she feels like she has a sinus infection.  Says she checked her pulse oximetry at home and was in the 70s and 80s but was unsure of the accuracy.  Denies feeling any shortness of breath or chest pain.  Has had fatigue.  No known sick contacts.  Denies any neurologic complaints. Denies any easy bleeding or bruising. Denies chest pain. Denies any nausea, vomiting, constipation, or diarrhea. Denies urinary complaints. Patient offers no further specific complaints today.   PAST MEDICAL HISTORY: Past Medical History:  Diagnosis Date   Anxiety    Asthma    B12 deficiency    Bipolar disorder (HCC)    Borderline diabetes    CHF (congestive heart failure) (HCC)    Colon polyps    Depression    Dyspnea    Family history of adverse reaction to anesthesia    mom and dad-delayed emergence   Fibromyalgia    Hilar adenopathy    History of hiatal hernia    History of melanoma    History of methicillin resistant staphylococcus aureus (MRSA) 2010   Hypertension    Hypoparathyroidism    Hypothyroidism    IDA (iron  deficiency  anemia)    Lung nodules    Mediastinal adenopathy    PONV (postoperative nausea and vomiting)    delayed emergence   Prolonged Q-T interval on ECG    Thyroid  disease     PAST SURGICAL HISTORY:  Past Surgical History:  Procedure Laterality Date   ANKLE SURGERY Right 2012   APPENDECTOMY     AUGMENTATION MAMMAPLASTY Bilateral    CESAREAN SECTION     x2   COLONOSCOPY WITH PROPOFOL  N/A 02/22/2015   Procedure: COLONOSCOPY WITH PROPOFOL ;  Surgeon: Donnice Vaughn Manes, MD;  Location: [facility] ENDOSCOPY;  Service: Endoscopy;  Laterality: N/A;   COLONOSCOPY WITH PROPOFOL  N/A 08/08/2022   Procedure: COLONOSCOPY WITH PROPOFOL ;  Surgeon: Maryruth Ole DASEN, MD;  Location: [facility] ENDOSCOPY;  Service: Endoscopy;  Laterality: N/A;   ESOPHAGOGASTRODUODENOSCOPY (EGD) WITH PROPOFOL  N/A 02/22/2015   Procedure: ESOPHAGOGASTRODUODENOSCOPY (EGD) WITH PROPOFOL ;  Surgeon: Donnice Vaughn Manes, MD;  Location: [facility] ENDOSCOPY;  Service: Endoscopy;  Laterality: N/A;   ESOPHAGOGASTRODUODENOSCOPY (EGD) WITH PROPOFOL  N/A 02/11/2016   Procedure: ESOPHAGOGASTRODUODENOSCOPY (EGD) WITH PROPOFOL ;  Surgeon: Lamar DASEN Holmes, MD;  Location: [facility] ENDOSCOPY;  Service: Endoscopy;  Laterality: N/A;   ESOPHAGOGASTRODUODENOSCOPY (EGD) WITH PROPOFOL  N/A 08/08/2022   Procedure: ESOPHAGOGASTRODUODENOSCOPY (EGD) WITH PROPOFOL ;  Surgeon: Maryruth Ole DASEN, MD;  Location: [facility] ENDOSCOPY;  Service: Endoscopy;  Laterality: N/A;   HEMORRHOID SURGERY     IR IMAGING GUIDED PORT INSERTION  05/22/2024   NASAL SINUS SURGERY     SAVORY DILATION N/A 02/22/2015   Procedure: SAVORY DILATION;  Surgeon: Donnice Vaughn  Jeri, MD;  Location: [facility] ENDOSCOPY;  Service: Endoscopy;  Laterality: N/A;   SAVORY DILATION N/A 02/11/2016   Procedure: SAVORY DILATION;  Surgeon: Lamar ONEIDA Holmes, MD;  Location: [facility] ENDOSCOPY;  Service: Endoscopy;  Laterality: N/A;   SKIN CANCER EXCISION     VIDEO BRONCHOSCOPY WITH ENDOBRONCHIAL NAVIGATION N/A 04/11/2024    Procedure: VIDEO BRONCHOSCOPY WITH ENDOBRONCHIAL NAVIGATION;  Surgeon: Parris Manna, MD;  Location: [facility] ORS;  Service: Thoracic;  Laterality: N/A;   VIDEO BRONCHOSCOPY WITH ENDOBRONCHIAL ULTRASOUND N/A 04/11/2024   Procedure: BRONCHOSCOPY, WITH EBUS;  Surgeon: Parris Manna, MD;  Location: [facility] ORS;  Service: Thoracic;  Laterality: N/A;    HEMATOLOGY/ONCOLOGY HISTORY:  Oncology History   No history exists.    ALLERGIES:  is allergic to chlorzoxazone, levofloxacin, atorvastatin, hydrochlorothiazide, pollen extract, and pseudoephedrine.  MEDICATIONS:  Current Outpatient Medications  Medication Sig Dispense Refill   acyclovir  (ZOVIRAX ) 400 MG tablet Take 1 tablet (400 mg total) by mouth daily for 90 doses. 90 tablet 0   allopurinol  (ZYLOPRIM ) 300 MG tablet Take 1 tablet (300 mg total) by mouth daily for 180 doses. 90 tablet 1   benzonatate  (TESSALON ) 100 MG capsule Take 1 capsule (100 mg total) by mouth 3 (three) times daily as needed for cough. 20 capsule 0   busPIRone  (BUSPAR ) 10 MG tablet Take 4 tablets (40 mg total) by mouth as directed. TAKE 1 TABLET BY MOUTH 3 TIMES A DAY AND 1 EXTRA TABLET AS NEEDED FOR SEVERE ANXIETY 120 tablet 2   calcitRIOL  (ROCALTROL ) 0.25 MCG capsule Take 1 capsule (0.25 mcg total) by mouth daily. 90 capsule 3   escitalopram  (LEXAPRO ) 10 MG tablet Take 1.5 tablets (15 mg total) by mouth daily. (Patient taking differently: Take 15 mg by mouth every morning.) 135 tablet 0   esomeprazole (NEXIUM) 40 MG capsule Take 1 capsule by mouth every morning.     fenofibrate 160 MG tablet Take 1 tablet by mouth daily.     fluticasone-salmeterol (ADVAIR) 250-50 MCG/ACT AEPB Inhale 1 puff into the lungs in the morning and at bedtime.     gabapentin  (NEURONTIN ) 300 MG capsule Take 300 mg by mouth See admin instructions. 2 CAP AM, 1 CAP Q noon, 2 CAP pm,     ipratropium (ATROVENT ) 0.03 % nasal spray Place 1 spray into both nostrils 2 (two) times daily.     isosorbide  mononitrate (IMDUR) 30 MG 24 hr tablet Take 30 mg by mouth 2 (two) times daily.     lamoTRIgine  (LAMICTAL ) 200 MG tablet Take 0.5 tablets (100 mg total) by mouth 2 (two) times daily. 90 tablet 1   lamoTRIgine  (LAMICTAL ) 25 MG tablet Take 1 tablet (25 mg total) by mouth daily. Take along with 200 mg daily , total of 225 mg daily 30 tablet 1   levothyroxine  (SYNTHROID ) 75 MCG tablet Take 1 tablet (75 mcg total) by mouth daily. (Patient taking differently: Take 75 mcg by mouth daily before breakfast.) 90 tablet 3   lidocaine -prilocaine  (EMLA ) cream Apply 1 Application topically as needed. 30 g 1   lisinopril (ZESTRIL) 10 MG tablet Take 10 mg by mouth daily.     lovastatin (MEVACOR) 20 MG tablet Take 20 mg by mouth at bedtime.     montelukast (SINGULAIR) 10 MG tablet Take 10 mg by mouth at bedtime.     ondansetron  (ZOFRAN ) 8 MG tablet Take 1 tablet (8 mg total) by mouth every 8 (eight) hours as needed for nausea or vomiting (nausea related to iron  infusions). 60  tablet 0   prazosin  (MINIPRESS ) 2 MG capsule Take 1 capsule (2 mg total) by mouth at bedtime. (Patient taking differently: Take 2 mg by mouth at bedtime. For Nightmares) 90 capsule 1   prochlorperazine  (COMPAZINE ) 10 MG tablet Take 1 tablet (10 mg total) by mouth every 6 (six) hours as needed for nausea or vomiting. 30 tablet 1   propranolol ER (INDERAL LA) 80 MG 24 hr capsule Take 80 mg by mouth every morning.     RESTASIS 0.05 % ophthalmic emulsion Place 1 drop into both eyes 2 (two) times daily.     rOPINIRole  (REQUIP ) 0.25 MG tablet TAKE 1 TABLET BY MOUTH 3 TIMES A DAY 90 tablet 2   temazepam  (RESTORIL ) 30 MG capsule Take 1 capsule (30 mg total) by mouth at bedtime. 30 capsule 2   TRYPTYR 0.003 % SOLN Apply 1 drop to eye 2 (two) times daily.     No current facility-administered medications for this visit.    VITAL SIGNS: BP 111/66 (BP Location: Left Arm, Patient Position: Sitting, Cuff Size: Normal)   Pulse 76   Temp 99.2 F (37.3 C)  (Tympanic)   Resp 18   Ht 5' 4 (1.626 m)   Wt 153 lb (69.4 kg)   SpO2 97%   BMI 26.26 kg/m  Filed Weights   06/09/24 1147  Weight: 153 lb (69.4 kg)    Estimated body mass index is 26.26 kg/m as calculated from the following:   Height as of this encounter: 5' 4 (1.626 m).   Weight as of this encounter: 153 lb (69.4 kg).  LABS: CBC:    Component Value Date/Time   WBC 5.0 06/02/2024 0810   WBC 5.0 05/07/2024 0857   HGB 10.8 (L) 06/02/2024 0810   HGB 11.9 02/04/2019 1037   HCT 34.3 (L) 06/02/2024 0810   HCT 37.0 02/04/2019 1037   PLT 261 06/02/2024 0810   PLT 311 02/04/2019 1037   MCV 80.7 06/02/2024 0810   MCV 84 02/04/2019 1037   MCV 94 08/18/2014 1323   NEUTROABS 2.8 06/02/2024 0810   NEUTROABS 2.7 02/04/2019 1037   NEUTROABS 5.1 09/14/2011 1028   LYMPHSABS 1.2 06/02/2024 0810   LYMPHSABS 2.1 02/04/2019 1037   LYMPHSABS 1.0 09/14/2011 1028   MONOABS 0.8 06/02/2024 0810   MONOABS 0.4 09/14/2011 1028   EOSABS 0.2 06/02/2024 0810   EOSABS 0.2 02/04/2019 1037   EOSABS 0.2 09/14/2011 1028   BASOSABS 0.1 06/02/2024 0810   BASOSABS 0.1 02/04/2019 1037   BASOSABS 0.0 09/14/2011 1028   Comprehensive Metabolic Panel:    Component Value Date/Time   NA 143 06/02/2024 0810   NA 143 02/16/2021 0906   NA 138 08/18/2014 1323   K 3.8 06/02/2024 0810   K 4.1 08/18/2014 1323   CL 107 06/02/2024 0810   CL 103 08/18/2014 1323   CO2 25 06/02/2024 0810   CO2 25 08/18/2014 1323   BUN 17 06/02/2024 0810   BUN 14 02/16/2021 0906   BUN 18 08/18/2014 1323   CREATININE 1.24 (H) 06/02/2024 0810   CREATININE 1.26 (H) 07/24/2023 1539   GLUCOSE 103 (H) 06/02/2024 0810   GLUCOSE 83 08/18/2014 1323   CALCIUM 9.4 06/02/2024 0810   CALCIUM 9.4 08/18/2014 1323   AST 24 06/02/2024 0810   ALT 15 06/02/2024 0810   ALT 33 08/18/2014 1323   ALKPHOS 84 06/02/2024 0810   ALKPHOS 93 08/18/2014 1323   BILITOT 0.3 06/02/2024 0810   PROT 6.2 (L) 06/02/2024 0810  PROT 7.5 08/18/2014 1323    ALBUMIN 4.4 06/02/2024 0810   ALBUMIN 4.0 08/18/2014 1323    RADIOGRAPHIC STUDIES: IR IMAGING GUIDED PORT INSERTION Result Date: 05/22/2024 CLINICAL DATA:  Marginal zone B-cell lymphoma and need for porta cath for chemotherapy. EXAM: IMPLANTED PORT A CATH PLACEMENT WITH ULTRASOUND AND FLUOROSCOPIC GUIDANCE ANESTHESIA/SEDATION: Moderate (conscious) sedation was employed during this procedure. A total of Versed  2.0 mg and Fentanyl  100 mcg was administered intravenously. Moderate Sedation Time: 30 minutes. The patient's level of consciousness and vital signs were monitored continuously by radiology nursing throughout the procedure under my direct supervision. FLUOROSCOPY: Radiation Exposure Index: 2.0 CT mGy Kerma PROCEDURE: The procedure, risks, benefits, and alternatives were explained to the patient. Questions regarding the procedure were encouraged and answered. The patient understands and consents to the procedure. A time-out was performed prior to initiating the procedure. Ultrasound was utilized to confirm patency of the right internal jugular vein. An ultrasound image was saved and recorded. The right neck and chest were prepped with chlorhexidine  in a sterile fashion, and a sterile drape was applied covering the operative field. Maximum barrier sterile technique with sterile gowns and gloves were used for the procedure. Local anesthesia was provided with 1% lidocaine . After creating a small venotomy incision, a 21 gauge needle was advanced into the right internal jugular vein under direct, real-time ultrasound guidance. Ultrasound image documentation was performed. After securing guidewire access, an 8 Fr dilator was placed. A J-wire was kinked to measure appropriate catheter length. A subcutaneous port pocket was then created along the upper chest wall utilizing sharp and blunt dissection. Portable cautery was utilized. The pocket was irrigated with sterile saline. A single lumen power injectable port  was chosen for placement. The 8 Fr catheter was tunneled from the port pocket site to the venotomy incision. The port was placed in the pocket. External catheter was trimmed to appropriate length based on guidewire measurement. At the venotomy, an 8 Fr peel-away sheath was placed over a guidewire. The catheter was then placed through the sheath and the sheath removed. Final catheter positioning was confirmed and documented with a fluoroscopic spot image. The port was accessed with a needle and aspirated and flushed with heparinized saline. The access needle was removed. The venotomy and port pocket incisions were closed with subcutaneous 3-0 Monocryl and subcuticular 4-0 Vicryl. Dermabond was applied to both incisions. COMPLICATIONS: COMPLICATIONS None FINDINGS: After catheter placement, the tip lies at the cavo-atrial junction. The catheter aspirates normally and is ready for immediate use. IMPRESSION: Placement of single lumen port a cath via right internal jugular vein. The catheter tip lies at the cavo-atrial junction. A power injectable port a cath was placed and is ready for immediate use. Electronically Signed   By: Marcey Moan M.D.   On: 05/22/2024 14:29    PERFORMANCE STATUS (ECOG) : 1 - Symptomatic but completely ambulatory  Review of Systems Unless otherwise noted, a complete review of systems is negative.  Physical Exam General: NAD HEENT: No cervical lymphadenopathy, no exudate, ears clear Cardiovascular: regular rate and rhythm Pulmonary: clear anterior/posterior fields Abdomen: soft, nontender, + bowel sounds GU: no suprapubic tenderness Extremities: no edema, no joint deformities Skin: no rashes Neurological:  nonfocal  IMPRESSION/PLAN: B cell lymphoma - on active treatment with Bendamustine -rituximab    URI - patient afebrile and nontoxic-appearing.  Suspect viral illness the patient is at higher risk on active chemotherapy.  Will start empirically on amoxicillin  500 mg  twice daily x 5 days.  Refill  benzonatate .  Recommended adding guaifenesin.  Patient can follow-up with PCP if needed.  ED triggers reviewed in detail, particularly as it pertains to pulse oximetry, although I do not believe that her home readings were accurate.   Patient expressed understanding and was in agreement with this plan. She also understands that She can call clinic at any time with any questions, concerns, or complaints.   Thank you for allowing me to participate in the care of this very pleasant patient.   Time Total: 15 minutes  Visit consisted of counseling and education dealing with the complex and emotionally intense issues of symptom management in the setting of serious illness.Greater than 50%  of this time was spent counseling and coordinating care related to the above assessment and plan.  Signed by: Fonda Mower, PhD, NP-C

## 2024-06-10 ENCOUNTER — Telehealth (INDEPENDENT_AMBULATORY_CARE_PROVIDER_SITE_OTHER): Admitting: Psychiatry

## 2024-06-10 DIAGNOSIS — F3131 Bipolar disorder, current episode depressed, mild: Secondary | ICD-10-CM

## 2024-06-10 NOTE — Progress Notes (Signed)
 Unable to complete evaluation due to network issue.  Communicated with staff to reschedule appointment.

## 2024-06-12 ENCOUNTER — Other Ambulatory Visit: Payer: Self-pay | Admitting: Psychiatry

## 2024-06-12 DIAGNOSIS — F411 Generalized anxiety disorder: Secondary | ICD-10-CM

## 2024-06-15 ENCOUNTER — Emergency Department
Admission: EM | Admit: 2024-06-15 | Discharge: 2024-06-15 | Disposition: A | Discharge status: Home / Self Care | Attending: Emergency Medicine | Admitting: Emergency Medicine

## 2024-06-15 ENCOUNTER — Emergency Department

## 2024-06-15 ENCOUNTER — Other Ambulatory Visit: Payer: Self-pay

## 2024-06-15 DIAGNOSIS — L509 Urticaria, unspecified: Secondary | ICD-10-CM | POA: Diagnosis not present

## 2024-06-15 DIAGNOSIS — X58XXXA Exposure to other specified factors, initial encounter: Secondary | ICD-10-CM | POA: Insufficient documentation

## 2024-06-15 DIAGNOSIS — T7840XA Allergy, unspecified, initial encounter: Secondary | ICD-10-CM | POA: Insufficient documentation

## 2024-06-15 DIAGNOSIS — R509 Fever, unspecified: Secondary | ICD-10-CM | POA: Insufficient documentation

## 2024-06-15 DIAGNOSIS — C8510 Unspecified B-cell lymphoma, unspecified site: Secondary | ICD-10-CM | POA: Diagnosis not present

## 2024-06-15 DIAGNOSIS — I509 Heart failure, unspecified: Secondary | ICD-10-CM | POA: Insufficient documentation

## 2024-06-15 DIAGNOSIS — T50901A Poisoning by unspecified drugs, medicaments and biological substances, accidental (unintentional), initial encounter: Secondary | ICD-10-CM | POA: Insufficient documentation

## 2024-06-15 DIAGNOSIS — R059 Cough, unspecified: Secondary | ICD-10-CM | POA: Diagnosis not present

## 2024-06-15 DIAGNOSIS — M797 Fibromyalgia: Secondary | ICD-10-CM | POA: Diagnosis not present

## 2024-06-15 LAB — CBC WITH DIFFERENTIAL/PLATELET
Abs Immature Granulocytes: 0.05 K/uL (ref 0.00–0.07)
Basophils Absolute: 0 K/uL (ref 0.0–0.1)
Basophils Relative: 0 %
Eosinophils Absolute: 0.3 K/uL (ref 0.0–0.5)
Eosinophils Relative: 4 %
HCT: 33.7 % — ABNORMAL LOW (ref 36.0–46.0)
Hemoglobin: 10.5 g/dL — ABNORMAL LOW (ref 12.0–15.0)
Immature Granulocytes: 1 %
Lymphocytes Relative: 6 %
Lymphs Abs: 0.4 K/uL — ABNORMAL LOW (ref 0.7–4.0)
MCH: 25.5 pg — ABNORMAL LOW (ref 26.0–34.0)
MCHC: 31.2 g/dL (ref 30.0–36.0)
MCV: 81.8 fL (ref 80.0–100.0)
Monocytes Absolute: 1.1 K/uL — ABNORMAL HIGH (ref 0.1–1.0)
Monocytes Relative: 15 %
Neutro Abs: 5.1 K/uL (ref 1.7–7.7)
Neutrophils Relative %: 74 %
Platelets: 235 K/uL (ref 150–400)
RBC: 4.12 MIL/uL (ref 3.87–5.11)
RDW: 16.1 % — ABNORMAL HIGH (ref 11.5–15.5)
WBC: 7 K/uL (ref 4.0–10.5)
nRBC: 0 % (ref 0.0–0.2)

## 2024-06-15 LAB — COMPREHENSIVE METABOLIC PANEL WITH GFR
ALT: 47 U/L — ABNORMAL HIGH (ref 0–44)
AST: 56 U/L — ABNORMAL HIGH (ref 15–41)
Albumin: 4.2 g/dL (ref 3.5–5.0)
Alkaline Phosphatase: 98 U/L (ref 38–126)
Anion gap: 12 (ref 5–15)
BUN: 19 mg/dL (ref 8–23)
CO2: 22 mmol/L (ref 22–32)
Calcium: 9 mg/dL (ref 8.9–10.3)
Chloride: 105 mmol/L (ref 98–111)
Creatinine, Ser: 0.97 mg/dL (ref 0.44–1.00)
GFR, Estimated: >60 mL/min (ref >60)
Glucose, Bld: 100 mg/dL — ABNORMAL HIGH (ref 70–99)
Potassium: 3.9 mmol/L (ref 3.5–5.1)
Sodium: 139 mmol/L (ref 135–145)
Total Bilirubin: <0.2 mg/dL (ref 0.0–1.2)
Total Protein: 6.5 g/dL (ref 6.5–8.1)

## 2024-06-15 LAB — RESP PANEL BY RT-PCR (RSV, FLU A&B, COVID)  RVPGX2
Influenza A by PCR: NEGATIVE
Influenza B by PCR: NEGATIVE
Resp Syncytial Virus by PCR: NEGATIVE
SARS Coronavirus 2 by RT PCR: NEGATIVE

## 2024-06-15 MED ORDER — DIPHENHYDRAMINE HCL 50 MG/ML IJ SOLN
25.0000 mg | Freq: Once | INTRAMUSCULAR | Status: AC
  Administered 2024-06-15: 25 mg via INTRAVENOUS
  Filled 2024-06-15: qty 1

## 2024-06-15 MED ORDER — LORAZEPAM 1 MG PO TABS
1.0000 mg | ORAL_TABLET | Freq: Once | ORAL | Status: AC
  Administered 2024-06-15: 1 mg via ORAL
  Filled 2024-06-15: qty 1

## 2024-06-15 MED ORDER — SODIUM CHLORIDE 0.9 % IV BOLUS
1000.0000 mL | Freq: Once | INTRAVENOUS | Status: AC
  Administered 2024-06-15: 1000 mL via INTRAVENOUS

## 2024-06-15 MED ORDER — LORAZEPAM 2 MG/ML IJ SOLN: 1.0000 mg | Freq: Once | INTRAMUSCULAR | Status: DC

## 2024-06-15 MED ORDER — FAMOTIDINE IN NACL 20-0.9 MG/50ML-% IV SOLN
20.0000 mg | Freq: Once | INTRAVENOUS | Status: AC
  Administered 2024-06-15: 20 mg via INTRAVENOUS
  Filled 2024-06-15: qty 50

## 2024-06-15 MED ORDER — PREDNISONE 20 MG PO TABS: 60.0000 mg | ORAL_TABLET | Freq: Every day | ORAL | 0 refills | Status: AC

## 2024-06-15 MED ORDER — IPRATROPIUM-ALBUTEROL 0.5-2.5 (3) MG/3ML IN SOLN
3.0000 mL | Freq: Once | RESPIRATORY_TRACT | Status: AC
  Administered 2024-06-15: 3 mL via RESPIRATORY_TRACT
  Filled 2024-06-15: qty 3

## 2024-06-15 MED ORDER — METHYLPREDNISOLONE SODIUM SUCC 125 MG IJ SOLR
125.0000 mg | Freq: Once | INTRAMUSCULAR | Status: AC
  Administered 2024-06-15: 125 mg via INTRAVENOUS
  Filled 2024-06-15: qty 2

## 2024-06-15 NOTE — ED Provider Notes (Signed)
 [facility] Provider Note    Event Date/Time   First MD Initiated Contact with Patient 06/15/24 1439     (approximate)  History   Chief Complaint: Allergic Reaction (/)  HPI  Kaylee Gordon is a 63 y.o. female with a past Eckel history of B-cell lymphoma on chemotherapy (last infusion 2 weeks ago), CHF, fibromyalgia, presents to the emergency department for possible allergic reaction.  According to the patient for the past 7 to 10 days or so she has had some upper respiratory symptoms described as a cough with congestion.  Patient was seen by her doctor at the cancer center 06/05/2024 was diagnosed with a likely viral URI but given her immunocompromise state on chemotherapy they covered her with amoxicillin  500 mg twice daily.  Patient states she finished the amoxicillin  yesterday, but began feeling itching all over her body.  Patient states she woke up this morning and felt like she was having some swelling in her lips and throat along with the itching she was concerned she was having allergic reaction so she came to the emergency department.  Patient states she has taken amoxicillin  previously without issue.  Patient is noted to have a borderline low-grade temperature 99.9 in the emergency department.  Physical Exam   Triage Vital Signs: ED Triage Vitals [06/15/24 1433]  Encounter Vitals Group     BP 127/75     Girls Systolic BP Percentile      Girls Diastolic BP Percentile      Boys Systolic BP Percentile      Boys Diastolic BP Percentile      Pulse Rate 73     Resp (!) 26     Temp 99.9 F (37.7 C)     Temp Source Oral     SpO2 98 %     Weight      Height      Head Circumference      Peak Flow      Pain Score      Pain Loc      Pain Education      Exclude from Growth Chart     Most recent vital signs: Vitals:   06/15/24 1433  BP: 127/75  Pulse: 73  Resp: (!) 26  Temp: 99.9 F (37.7 C)  SpO2: 98%    General: Awake, moderately anxious  appearing CV:  Good peripheral perfusion.  Regular rate and rhythm  Resp:  Normal effort.  Equal breath sounds bilaterally.  Abd:  No distention.  Soft, nontender.  No rebound or guarding.  ED Results / Procedures / Treatments   MEDICATIONS ORDERED IN ED: Medications - No data to display   IMPRESSION / MDM / ASSESSMENT AND PLAN / ED COURSE  I reviewed the triage vital signs and the nursing notes.  Patient's presentation is most consistent with acute presentation with potential threat to life or bodily function.  Patient presents emergency department for itching and a sensation of swelling in her throat.  Patient is very anxious appearing.  She also has a low-grade temperature 99.9 states for the last week or so she has had congestion and a slight cough.  Given the patient's immunocompromise status we will check labs including a CBC with differential and a chemistry will obtain a COVID/flu swab will obtain a chest x-ray.  Will treat with IV Solu-Medrol, Pepcid, Benadryl  and IV fluids.  Patient did take oral Benadryl  prior to arrival.  Given the patient's very anxious appearance we  will dose small dose of oral Ativan as well while awaiting further results.  Patient care signed out to oncoming provider results pending.  FINAL CLINICAL IMPRESSION(S) / ED DIAGNOSES   Allergic reaction   Note:  This document was prepared using Dragon voice recognition software and may include unintentional dictation errors.   Dorothyann Drivers, MD 06/15/24 1452

## 2024-06-15 NOTE — ED Provider Notes (Signed)
.-----------------------------------------   5:55 PM on 06/15/2024 -----------------------------------------  Blood pressure 127/75, pulse 73, temperature 99.9 F (37.7 C), temperature source Oral, resp. rate (!) 26, SpO2 100%.  Assuming care from Dr. Dorothyann.  In short, Kaylee Gordon is a 63 y.o. female with a chief complaint of itching and throat tightness.  Refer to the original H&P for additional details.  The current plan of care is to follow-up labs, reassessment.  On reassessment patient is feeling a lot better, discussed with her about prescribing 4 more days of steroids for her hives.  Instructed her to follow-up this week to get reassessed with her primary care doctor.  Considered but no indication for inpatient admission at this time, she safe for outpatient management.  Will discharge to strict return precautions.   Clinical Course as of 06/15/24 1756  Sun Jun 15, 2024  1610 DG Chest Portable 1 View No acute cardiopulmonary disease.  [TT]  1639 Independent review of labs, no leukocytosis, electrolytes are severely deranged, AST and ALT are mildly elevated but she has no jaundice or abdominal pain at this time.  Respiratory viral panel is negative. [TT]  1732 On reassessment patient states that she still have some itching and hives but this is improved compared to prior.  She denies any shortness of breath or chest pain.  States that the throat tightness has resolved.  Will give her a dose of Solu-Medrol here and plan to discharge her with a burst dose steroid course. [TT]    Clinical Course User Index [TT] Waymond Lorelle Cummins, MD     Medications  sodium chloride  0.9 % bolus 1,000 mL (0 mLs Intravenous Stopped 06/15/24 1659)  ipratropium-albuterol  (DUONEB) 0.5-2.5 (3) MG/3ML nebulizer solution 3 mL (3 mLs Nebulization Given 06/15/24 1500)  diphenhydrAMINE  (BENADRYL ) injection 25 mg (25 mg Intravenous Given 06/15/24 1514)  famotidine (PEPCID) IVPB 20 mg premix (0 mg Intravenous  Stopped 06/15/24 1608)  LORazepam (ATIVAN) tablet 1 mg (1 mg Oral Given 06/15/24 1500)  diphenhydrAMINE  (BENADRYL ) injection 25 mg (25 mg Intravenous Given 06/15/24 1623)  methylPREDNISolone sodium succinate (SOLU-MEDROL) 125 mg/2 mL injection 125 mg (125 mg Intravenous Given 06/15/24 1744)     ED Discharge Orders          Ordered    predniSONE  (DELTASONE ) 20 MG tablet  Daily with breakfast        06/15/24 1755           Final diagnoses:  Allergic reaction, initial encounter  Urticaria      Waymond Lorelle Cummins, MD 06/15/24 1756

## 2024-06-15 NOTE — ED Triage Notes (Addendum)
 Pt to ED via POV from home. Pt reports took last dose of amoxicillin  last night. Pt reports started to have allergic rxn last night and SOB this morning. Pt also reports lip swelling. Pt hx lymphoma cancer and currently on tx. During triage pt continues to report that her oxygen level is usually 88% and that 98% is a fluke. Pt asked if she has been placed on oxygen and pt reports no. Pt very anxious in triage.

## 2024-06-17 ENCOUNTER — Other Ambulatory Visit: Payer: Self-pay | Admitting: *Deleted

## 2024-06-17 ENCOUNTER — Encounter: Payer: Self-pay | Admitting: Hospice and Palliative Medicine

## 2024-06-17 ENCOUNTER — Encounter: Payer: Self-pay | Admitting: *Deleted

## 2024-06-17 ENCOUNTER — Inpatient Hospital Stay: Attending: Internal Medicine | Admitting: Hospice and Palliative Medicine

## 2024-06-17 VITALS — BP 109/68 | HR 75 | Temp 97.0°F | Resp 17 | Wt 156.0 lb

## 2024-06-17 DIAGNOSIS — Z808 Family history of malignant neoplasm of other organs or systems: Secondary | ICD-10-CM | POA: Insufficient documentation

## 2024-06-17 DIAGNOSIS — E209 Hypoparathyroidism, unspecified: Secondary | ICD-10-CM | POA: Insufficient documentation

## 2024-06-17 DIAGNOSIS — Z9049 Acquired absence of other specified parts of digestive tract: Secondary | ICD-10-CM | POA: Insufficient documentation

## 2024-06-17 DIAGNOSIS — I110 Hypertensive heart disease with heart failure: Secondary | ICD-10-CM | POA: Diagnosis not present

## 2024-06-17 DIAGNOSIS — Z860100 Personal history of colon polyps, unspecified: Secondary | ICD-10-CM | POA: Insufficient documentation

## 2024-06-17 DIAGNOSIS — C8300 Small cell B-cell lymphoma, unspecified site: Secondary | ICD-10-CM | POA: Diagnosis present

## 2024-06-17 DIAGNOSIS — Z7951 Long term (current) use of inhaled steroids: Secondary | ICD-10-CM | POA: Insufficient documentation

## 2024-06-17 DIAGNOSIS — Z7964 Long term (current) use of myelosuppressive agent: Secondary | ICD-10-CM | POA: Insufficient documentation

## 2024-06-17 DIAGNOSIS — Z5112 Encounter for antineoplastic immunotherapy: Secondary | ICD-10-CM | POA: Insufficient documentation

## 2024-06-17 DIAGNOSIS — D47Z9 Other specified neoplasms of uncertain behavior of lymphoid, hematopoietic and related tissue: Secondary | ICD-10-CM | POA: Diagnosis not present

## 2024-06-17 DIAGNOSIS — Z880 Allergy status to penicillin: Secondary | ICD-10-CM | POA: Diagnosis not present

## 2024-06-17 DIAGNOSIS — Z8719 Personal history of other diseases of the digestive system: Secondary | ICD-10-CM | POA: Insufficient documentation

## 2024-06-17 DIAGNOSIS — K1230 Oral mucositis (ulcerative), unspecified: Secondary | ICD-10-CM

## 2024-06-17 DIAGNOSIS — D649 Anemia, unspecified: Secondary | ICD-10-CM | POA: Insufficient documentation

## 2024-06-17 DIAGNOSIS — Z79890 Hormone replacement therapy (postmenopausal): Secondary | ICD-10-CM | POA: Insufficient documentation

## 2024-06-17 DIAGNOSIS — Z79624 Long term (current) use of inhibitors of nucleotide synthesis: Secondary | ICD-10-CM | POA: Diagnosis not present

## 2024-06-17 DIAGNOSIS — Z85820 Personal history of malignant melanoma of skin: Secondary | ICD-10-CM | POA: Diagnosis not present

## 2024-06-17 DIAGNOSIS — E039 Hypothyroidism, unspecified: Secondary | ICD-10-CM | POA: Insufficient documentation

## 2024-06-17 DIAGNOSIS — Z5111 Encounter for antineoplastic chemotherapy: Secondary | ICD-10-CM | POA: Diagnosis present

## 2024-06-17 DIAGNOSIS — Z85828 Personal history of other malignant neoplasm of skin: Secondary | ICD-10-CM | POA: Insufficient documentation

## 2024-06-17 DIAGNOSIS — Z881 Allergy status to other antibiotic agents status: Secondary | ICD-10-CM | POA: Insufficient documentation

## 2024-06-17 DIAGNOSIS — Z79899 Other long term (current) drug therapy: Secondary | ICD-10-CM | POA: Diagnosis not present

## 2024-06-17 DIAGNOSIS — Z800 Family history of malignant neoplasm of digestive organs: Secondary | ICD-10-CM | POA: Diagnosis not present

## 2024-06-17 DIAGNOSIS — Z8614 Personal history of Methicillin resistant Staphylococcus aureus infection: Secondary | ICD-10-CM | POA: Diagnosis not present

## 2024-06-17 DIAGNOSIS — Z7952 Long term (current) use of systemic steroids: Secondary | ICD-10-CM | POA: Diagnosis not present

## 2024-06-17 MED ORDER — MAGIC MOUTHWASH W/LIDOCAINE: 5.0000 mL | Freq: Four times a day (QID) | ORAL | 3 refills | Status: AC

## 2024-06-17 NOTE — Progress Notes (Signed)
 Symptom Management Clinic [facility] at [facility] [phone] [phone]  Patient Care Team: Alla Amis, [name], [name], [name], [name]   NAME OF PATIENT: Kaylee Gordon  [id number]  11-01-1960   DATE OF VISIT: 06/17/24  REASON FOR CONSULT: Kaylee Gordon is a 63 y.o. female with multiple medical problems including bipolar/fibromyalgia with history of IDA, recently diagnosed low-grade B-cell lymphoma.   INTERVAL HISTORY: Patient status post cycle 1 Bendamustine -rituximab  on 06/02/2024.  Patient was seen recently in [facility] for evaluation of URI.  With started on amoxicillin .  Presented to the emergency department with hives and started on steroids.  Patient presents to [facility] today with complaint of oral sores and pain.  Says that she has been having difficulty eating due to discomfort.  Denies fever or chills.  Still endorses nasal congestion but states the cough is improving.  Denies any neurologic complaints. Denies any easy bleeding or bruising. Denies chest pain. Denies any nausea, vomiting, constipation, or diarrhea. Denies urinary complaints. Patient offers no further specific complaints today.   PAST MEDICAL HISTORY: Past Medical History:  Diagnosis Date   Anxiety    Asthma    B12 deficiency    Bipolar disorder (HCC)    Borderline diabetes    CHF (congestive heart failure) (HCC)    Colon polyps    Depression    Dyspnea    Family history of adverse reaction to anesthesia    mom and dad-delayed emergence   Fibromyalgia    Hilar adenopathy    History of hiatal hernia    History of melanoma    History of methicillin resistant staphylococcus aureus (MRSA) 2010   Hypertension    Hypoparathyroidism    Hypothyroidism    IDA (iron  deficiency anemia)     Lung nodules    Mediastinal adenopathy    PONV (postoperative nausea and vomiting)    delayed emergence   Prolonged Q-T interval on ECG    Thyroid  disease     PAST SURGICAL HISTORY:  Past Surgical History:  Procedure Laterality Date   ANKLE SURGERY Right 2012   APPENDECTOMY     AUGMENTATION MAMMAPLASTY Bilateral    CESAREAN SECTION     x2   COLONOSCOPY WITH PROPOFOL  N/A 02/22/2015   Procedure: COLONOSCOPY WITH PROPOFOL ;  Surgeon: Donnice Vaughn Manes, MD;  Location: [facility] ENDOSCOPY;  Service: Endoscopy;  Laterality: N/A;   COLONOSCOPY WITH PROPOFOL  N/A 08/08/2022   Procedure: COLONOSCOPY WITH PROPOFOL ;  Surgeon: Maryruth Ole DASEN, MD;  Location: [facility] ENDOSCOPY;  Service: Endoscopy;  Laterality: N/A;   ESOPHAGOGASTRODUODENOSCOPY (EGD) WITH PROPOFOL  N/A 02/22/2015   Procedure: ESOPHAGOGASTRODUODENOSCOPY (EGD) WITH PROPOFOL ;  Surgeon: Donnice Vaughn Manes, MD;  Location: [facility] ENDOSCOPY;  Service: Endoscopy;  Laterality: N/A;   ESOPHAGOGASTRODUODENOSCOPY (EGD) WITH PROPOFOL  N/A 02/11/2016   Procedure: ESOPHAGOGASTRODUODENOSCOPY (EGD) WITH PROPOFOL ;  Surgeon: Lamar DASEN Holmes, MD;  Location: [facility] ENDOSCOPY;  Service: Endoscopy;  Laterality: N/A;   ESOPHAGOGASTRODUODENOSCOPY (EGD) WITH PROPOFOL  N/A 08/08/2022   Procedure: ESOPHAGOGASTRODUODENOSCOPY (EGD) WITH PROPOFOL ;  Surgeon: Maryruth Ole DASEN, MD;  Location: [facility] ENDOSCOPY;  Service: Endoscopy;  Laterality: N/A;   HEMORRHOID SURGERY     IR IMAGING GUIDED PORT INSERTION  05/22/2024   NASAL SINUS SURGERY     SAVORY DILATION N/A 02/22/2015   Procedure: SAVORY DILATION;  Surgeon: Donnice Vaughn Manes, MD;  Location: [facility] ENDOSCOPY;  Service: Endoscopy;  Laterality: N/A;   SAVORY DILATION N/A 02/11/2016   Procedure: SAVORY DILATION;  Surgeon: Lamar ONEIDA Holmes, MD;  Location: [facility] ENDOSCOPY;  Service: Endoscopy;  Laterality: N/A;   SKIN CANCER EXCISION     VIDEO BRONCHOSCOPY WITH ENDOBRONCHIAL NAVIGATION N/A 04/11/2024   Procedure: VIDEO  BRONCHOSCOPY WITH ENDOBRONCHIAL NAVIGATION;  Surgeon: Parris Manna, MD;  Location: [facility] ORS;  Service: Thoracic;  Laterality: N/A;   VIDEO BRONCHOSCOPY WITH ENDOBRONCHIAL ULTRASOUND N/A 04/11/2024   Procedure: BRONCHOSCOPY, WITH EBUS;  Surgeon: Parris Manna, MD;  Location: [facility] ORS;  Service: Thoracic;  Laterality: N/A;    HEMATOLOGY/ONCOLOGY HISTORY:  Oncology History   No history exists.    ALLERGIES:  is allergic to amoxicillin , chlorzoxazone, levofloxacin, atorvastatin, hydrochlorothiazide, pollen extract, and pseudoephedrine.  MEDICATIONS:  Current Outpatient Medications  Medication Sig Dispense Refill   acyclovir  (ZOVIRAX ) 400 MG tablet Take 1 tablet (400 mg total) by mouth daily for 90 doses. 90 tablet 0   allopurinol  (ZYLOPRIM ) 300 MG tablet Take 1 tablet (300 mg total) by mouth daily for 180 doses. 90 tablet 1   benzonatate  (TESSALON ) 100 MG capsule Take 1 capsule (100 mg total) by mouth 3 (three) times daily as needed for cough. 20 capsule 0   busPIRone  (BUSPAR ) 10 MG tablet TAKE 1 TABLET BY MOUTH 3 TIMES A DAY * MAY TAKE 1 EXTRA TABLET AS NEEDED FOR SEVERE ANXIETY* 120 tablet 2   calcitRIOL  (ROCALTROL ) 0.25 MCG capsule Take 1 capsule (0.25 mcg total) by mouth daily. 90 capsule 3   escitalopram  (LEXAPRO ) 10 MG tablet Take 1.5 tablets (15 mg total) by mouth daily. 135 tablet 0   esomeprazole (NEXIUM) 40 MG capsule Take 1 capsule by mouth every morning.     fenofibrate 160 MG tablet Take 1 tablet by mouth daily.     fluticasone-salmeterol (ADVAIR) 250-50 MCG/ACT AEPB Inhale 1 puff into the lungs in the morning and at bedtime.     gabapentin  (NEURONTIN ) 300 MG capsule Take 300 mg by mouth See admin instructions. 2 CAP AM, 1 CAP Q noon, 2 CAP pm,     ipratropium (ATROVENT ) 0.03 % nasal spray Place 1 spray into both nostrils 2 (two) times daily.     isosorbide mononitrate (IMDUR) 30 MG 24 hr tablet Take 30 mg by mouth 2 (two) times daily.     lamoTRIgine  (LAMICTAL ) 200 MG tablet  Take 0.5 tablets (100 mg total) by mouth 2 (two) times daily. 90 tablet 1   lamoTRIgine  (LAMICTAL ) 25 MG tablet Take 1 tablet (25 mg total) by mouth daily. Take along with 200 mg daily , total of 225 mg daily 30 tablet 1   levothyroxine  (SYNTHROID ) 75 MCG tablet Take 1 tablet (75 mcg total) by mouth daily. 90 tablet 3   lidocaine -prilocaine  (EMLA ) cream Apply 1 Application topically as needed. 30 g 1   lisinopril (ZESTRIL) 10 MG tablet Take 10 mg by mouth daily.     lovastatin (MEVACOR) 20 MG tablet Take 20 mg by mouth at bedtime.     magic mouthwash w/lidocaine  SOLN Take 5 mLs by mouth 4 (four) times daily. 480 mL 3   montelukast (SINGULAIR) 10 MG tablet Take 10 mg by mouth at bedtime.     ondansetron  (ZOFRAN ) 8 MG tablet Take 1 tablet (8 mg total) by mouth every 8 (eight) hours as needed for nausea or vomiting (nausea related to iron  infusions). 60 tablet 0   prazosin  (MINIPRESS ) 2 MG capsule Take 1 capsule (2 mg total) by mouth  at bedtime. 90 capsule 1   predniSONE  (DELTASONE ) 20 MG tablet Take 3 tablets (60 mg total) by mouth daily with breakfast for 4 days. 12 tablet 0   prochlorperazine  (COMPAZINE ) 10 MG tablet Take 1 tablet (10 mg total) by mouth every 6 (six) hours as needed for nausea or vomiting. 30 tablet 1   propranolol ER (INDERAL LA) 80 MG 24 hr capsule Take 80 mg by mouth every morning.     RESTASIS 0.05 % ophthalmic emulsion Place 1 drop into both eyes 2 (two) times daily.     rOPINIRole  (REQUIP ) 0.25 MG tablet TAKE 1 TABLET BY MOUTH 3 TIMES A DAY 90 tablet 2   temazepam  (RESTORIL ) 30 MG capsule Take 1 capsule (30 mg total) by mouth at bedtime. 30 capsule 2   TRYPTYR 0.003 % SOLN Apply 1 drop to eye 2 (two) times daily.     No current facility-administered medications for this visit.    VITAL SIGNS: BP 109/68 (Patient Position: Sitting)   Pulse 75   Temp (!) 97 F (36.1 C) (Tympanic)   Resp 17   Wt 156 lb (70.8 kg)   SpO2 99%   BMI 26.78 kg/m  Filed Weights   06/17/24  1349  Weight: 156 lb (70.8 kg)    Estimated body mass index is 26.78 kg/m as calculated from the following:   Height as of 06/09/24: 5' 4 (1.626 m).   Weight as of this encounter: 156 lb (70.8 kg).  LABS: CBC:    Component Value Date/Time   WBC 7.0 06/15/2024 1516   HGB 10.5 (L) 06/15/2024 1516   HGB 10.8 (L) 06/02/2024 0810   HGB 11.9 02/04/2019 1037   HCT 33.7 (L) 06/15/2024 1516   HCT 37.0 02/04/2019 1037   PLT 235 06/15/2024 1516   PLT 261 06/02/2024 0810   PLT 311 02/04/2019 1037   MCV 81.8 06/15/2024 1516   MCV 84 02/04/2019 1037   MCV 94 08/18/2014 1323   NEUTROABS 5.1 06/15/2024 1516   NEUTROABS 2.7 02/04/2019 1037   NEUTROABS 5.1 09/14/2011 1028   LYMPHSABS 0.4 (L) 06/15/2024 1516   LYMPHSABS 2.1 02/04/2019 1037   LYMPHSABS 1.0 09/14/2011 1028   MONOABS 1.1 (H) 06/15/2024 1516   MONOABS 0.4 09/14/2011 1028   EOSABS 0.3 06/15/2024 1516   EOSABS 0.2 02/04/2019 1037   EOSABS 0.2 09/14/2011 1028   BASOSABS 0.0 06/15/2024 1516   BASOSABS 0.1 02/04/2019 1037   BASOSABS 0.0 09/14/2011 1028   Comprehensive Metabolic Panel:    Component Value Date/Time   NA 139 06/15/2024 1516   NA 143 02/16/2021 0906   NA 138 08/18/2014 1323   K 3.9 06/15/2024 1516   K 4.1 08/18/2014 1323   CL 105 06/15/2024 1516   CL 103 08/18/2014 1323   CO2 22 06/15/2024 1516   CO2 25 08/18/2014 1323   BUN 19 06/15/2024 1516   BUN 14 02/16/2021 0906   BUN 18 08/18/2014 1323   CREATININE 0.97 06/15/2024 1516   CREATININE 1.24 (H) 06/02/2024 0810   CREATININE 1.26 (H) 07/24/2023 1539   GLUCOSE 100 (H) 06/15/2024 1516   GLUCOSE 83 08/18/2014 1323   CALCIUM 9.0 06/15/2024 1516   CALCIUM 9.4 08/18/2014 1323   AST 56 (H) 06/15/2024 1516   AST 24 06/02/2024 0810   ALT 47 (H) 06/15/2024 1516   ALT 15 06/02/2024 0810   ALT 33 08/18/2014 1323   ALKPHOS 98 06/15/2024 1516   ALKPHOS 93 08/18/2014 1323   BILITOT <0.2  06/15/2024 1516   BILITOT 0.3 06/02/2024 0810   PROT 6.5 06/15/2024 1516    PROT 7.5 08/18/2014 1323   ALBUMIN 4.2 06/15/2024 1516   ALBUMIN 4.0 08/18/2014 1323    RADIOGRAPHIC STUDIES: DG Chest Portable 1 View Result Date: 06/15/2024 CLINICAL DATA:  Cough. EXAM: PORTABLE CHEST 1 VIEW COMPARISON:  04/11/2024 and older studies.  CT, 04/09/2024. FINDINGS: Cardiac silhouette is normal in size. No mediastinal or hilar masses. Right anterior chest wall Port-A-Cath has its tip at the caval atrial junction, new since the prior exam. Diffuse interstitial thickening, less prominent than on the most recent prior chest radiograph. No lung consolidation. No pleural effusion or pneumothorax. Skeletal structures are grossly intact IMPRESSION: No acute cardiopulmonary disease. Electronically Signed   By: Alm Parkins M.D.   On: 06/15/2024 15:51   IR IMAGING GUIDED PORT INSERTION Result Date: 05/22/2024 CLINICAL DATA:  Marginal zone B-cell lymphoma and need for porta cath for chemotherapy. EXAM: IMPLANTED PORT A CATH PLACEMENT WITH ULTRASOUND AND FLUOROSCOPIC GUIDANCE ANESTHESIA/SEDATION: Moderate (conscious) sedation was employed during this procedure. A total of Versed  2.0 mg and Fentanyl  100 mcg was administered intravenously. Moderate Sedation Time: 30 minutes. The patient's level of consciousness and vital signs were monitored continuously by radiology nursing throughout the procedure under my direct supervision. FLUOROSCOPY: Radiation Exposure Index: 2.0 CT mGy Kerma PROCEDURE: The procedure, risks, benefits, and alternatives were explained to the patient. Questions regarding the procedure were encouraged and answered. The patient understands and consents to the procedure. A time-out was performed prior to initiating the procedure. Ultrasound was utilized to confirm patency of the right internal jugular vein. An ultrasound image was saved and recorded. The right neck and chest were prepped with chlorhexidine  in a sterile fashion, and a sterile drape was applied covering the operative  field. Maximum barrier sterile technique with sterile gowns and gloves were used for the procedure. Local anesthesia was provided with 1% lidocaine . After creating a small venotomy incision, a 21 gauge needle was advanced into the right internal jugular vein under direct, real-time ultrasound guidance. Ultrasound image documentation was performed. After securing guidewire access, an 8 Fr dilator was placed. A J-wire was kinked to measure appropriate catheter length. A subcutaneous port pocket was then created along the upper chest wall utilizing sharp and blunt dissection. Portable cautery was utilized. The pocket was irrigated with sterile saline. A single lumen power injectable port was chosen for placement. The 8 Fr catheter was tunneled from the port pocket site to the venotomy incision. The port was placed in the pocket. External catheter was trimmed to appropriate length based on guidewire measurement. At the venotomy, an 8 Fr peel-away sheath was placed over a guidewire. The catheter was then placed through the sheath and the sheath removed. Final catheter positioning was confirmed and documented with a fluoroscopic spot image. The port was accessed with a needle and aspirated and flushed with heparinized saline. The access needle was removed. The venotomy and port pocket incisions were closed with subcutaneous 3-0 Monocryl and subcuticular 4-0 Vicryl. Dermabond was applied to both incisions. COMPLICATIONS: COMPLICATIONS None FINDINGS: After catheter placement, the tip lies at the cavo-atrial junction. The catheter aspirates normally and is ready for immediate use. IMPRESSION: Placement of single lumen port a cath via right internal jugular vein. The catheter tip lies at the cavo-atrial junction. A power injectable port a cath was placed and is ready for immediate use. Electronically Signed   By: Marcey Moan M.D.   On: 05/22/2024 14:29  PERFORMANCE STATUS (ECOG) : 1 - Symptomatic but completely  ambulatory  Review of Systems Unless otherwise noted, a complete review of systems is negative.  Physical Exam General: NAD HEENT: No cervical lymphadenopathy, no exudate, several small oral ulcerations Cardiovascular: regular rate and rhythm Pulmonary: clear anterior/posterior fields Abdomen: soft, nontender, + bowel sounds GU: no suprapubic tenderness Extremities: no edema, no joint deformities Skin: no rashes Neurological:  nonfocal  IMPRESSION/PLAN: B cell lymphoma - on active treatment with Bendamustine -rituximab    Mucositis -start Magic mouthwash with lidocaine .  Also discussed option of baking soda and salt rinses.  Recommend good oral hygiene.  MD follow-up 1 week.  Case and plan discussed with Dr. Rennie  Patient expressed understanding and was in agreement with this plan. She also understands that She can call clinic at any time with any questions, concerns, or complaints.   Thank you for allowing me to participate in the care of this very pleasant patient.   Time Total: 15 minutes  Visit consisted of counseling and education dealing with the complex and emotionally intense issues of symptom management in the setting of serious illness.Greater than 50%  of this time was spent counseling and coordinating care related to the above assessment and plan.  Signed by: Fonda Mower, PhD, NP-C

## 2024-06-17 NOTE — Progress Notes (Signed)
 Patient complains of lip peeling, sore mouth and difficulty eating

## 2024-06-17 NOTE — Patient Instructions (Signed)
 Use a homemade mouth rinse to rinse your mouth: To prepare it, mix one teaspoon of baking soda and one teaspoon of salt into 4 cups (1 L) of water.

## 2024-06-24 ENCOUNTER — Other Ambulatory Visit: Payer: Self-pay | Admitting: Psychiatry

## 2024-06-24 DIAGNOSIS — F411 Generalized anxiety disorder: Secondary | ICD-10-CM

## 2024-06-26 NOTE — Addendum Note (Signed)
 Encounter addended by: Janice Lynwood BROCKS on: 06/26/2024 9:34 AM  Actions taken: Imaging Exam ended

## 2024-06-27 ENCOUNTER — Telehealth: Payer: Self-pay | Admitting: Psychiatry

## 2024-06-27 ENCOUNTER — Telehealth (INDEPENDENT_AMBULATORY_CARE_PROVIDER_SITE_OTHER): Admitting: Psychiatry

## 2024-06-27 DIAGNOSIS — F411 Generalized anxiety disorder: Secondary | ICD-10-CM

## 2024-06-27 NOTE — Progress Notes (Signed)
 Patient had connection problem at the time of appointment.  Patient contacted staff, communicated with staff to reschedule this appointment.

## 2024-06-27 NOTE — Telephone Encounter (Signed)
 Received refill request for temazepam /Restoril -however per review of electronic health record a prescription was sent out on 11/10/ 2025-30 days with 2 refills hence patient is not due for a refill at this time.

## 2024-06-30 ENCOUNTER — Inpatient Hospital Stay

## 2024-06-30 ENCOUNTER — Encounter: Payer: Self-pay | Admitting: Internal Medicine

## 2024-06-30 ENCOUNTER — Inpatient Hospital Stay: Admitting: Internal Medicine

## 2024-06-30 VITALS — BP 91/63 | HR 84

## 2024-06-30 VITALS — BP 111/65 | HR 77 | Temp 97.0°F | Resp 16 | Ht 64.0 in | Wt 155.4 lb

## 2024-06-30 DIAGNOSIS — D47Z9 Other specified neoplasms of uncertain behavior of lymphoid, hematopoietic and related tissue: Secondary | ICD-10-CM

## 2024-06-30 DIAGNOSIS — Z5111 Encounter for antineoplastic chemotherapy: Secondary | ICD-10-CM | POA: Diagnosis not present

## 2024-06-30 LAB — CBC WITH DIFFERENTIAL (CANCER CENTER ONLY)
Abs Immature Granulocytes: 0.04 K/uL (ref 0.00–0.07)
Basophils Absolute: 0.1 K/uL (ref 0.0–0.1)
Basophils Relative: 2 %
Eosinophils Absolute: 0.4 K/uL (ref 0.0–0.5)
Eosinophils Relative: 7 %
HCT: 34.7 % — ABNORMAL LOW (ref 36.0–46.0)
Hemoglobin: 10.8 g/dL — ABNORMAL LOW (ref 12.0–15.0)
Immature Granulocytes: 1 %
Lymphocytes Relative: 7 %
Lymphs Abs: 0.4 K/uL — ABNORMAL LOW (ref 0.7–4.0)
MCH: 25.8 pg — ABNORMAL LOW (ref 26.0–34.0)
MCHC: 31.1 g/dL (ref 30.0–36.0)
MCV: 82.8 fL (ref 80.0–100.0)
Monocytes Absolute: 0.8 K/uL (ref 0.1–1.0)
Monocytes Relative: 14 %
Neutro Abs: 4 K/uL (ref 1.7–7.7)
Neutrophils Relative %: 69 %
Platelet Count: 215 K/uL (ref 150–400)
RBC: 4.19 MIL/uL (ref 3.87–5.11)
RDW: 17.2 % — ABNORMAL HIGH (ref 11.5–15.5)
WBC Count: 5.7 K/uL (ref 4.0–10.5)
nRBC: 0 % (ref 0.0–0.2)

## 2024-06-30 LAB — CMP (CANCER CENTER ONLY)
ALT: 17 U/L (ref 0–44)
AST: 20 U/L (ref 15–41)
Albumin: 4.4 g/dL (ref 3.5–5.0)
Alkaline Phosphatase: 74 U/L (ref 38–126)
Anion gap: 11 (ref 5–15)
BUN: 14 mg/dL (ref 8–23)
CO2: 24 mmol/L (ref 22–32)
Calcium: 9.4 mg/dL (ref 8.9–10.3)
Chloride: 106 mmol/L (ref 98–111)
Creatinine: 1.25 mg/dL — ABNORMAL HIGH (ref 0.44–1.00)
GFR, Estimated: 48 mL/min — ABNORMAL LOW (ref >60)
Glucose, Bld: 124 mg/dL — ABNORMAL HIGH (ref 70–99)
Potassium: 3.7 mmol/L (ref 3.5–5.1)
Sodium: 142 mmol/L (ref 135–145)
Total Bilirubin: 0.3 mg/dL (ref 0.0–1.2)
Total Protein: 6.7 g/dL (ref 6.5–8.1)

## 2024-06-30 MED ORDER — DIPHENHYDRAMINE HCL 25 MG PO TABS
50.0000 mg | ORAL_TABLET | Freq: Once | ORAL | Status: AC
  Administered 2024-06-30: 10:00:00 50 mg via ORAL
  Filled 2024-06-30: qty 2

## 2024-06-30 MED ORDER — SODIUM CHLORIDE 0.9 % IV SOLN
90.0000 mg/m2 | Freq: Once | INTRAVENOUS | Status: AC
  Administered 2024-06-30: 13:00:00 160 mg via INTRAVENOUS
  Filled 2024-06-30: qty 6.4

## 2024-06-30 MED ORDER — PALONOSETRON HCL INJECTION 0.25 MG/5ML
0.2500 mg | Freq: Once | INTRAVENOUS | Status: AC
  Administered 2024-06-30: 10:00:00 0.25 mg via INTRAVENOUS
  Filled 2024-06-30: qty 5

## 2024-06-30 MED ORDER — SODIUM CHLORIDE 0.9 % IV SOLN
INTRAVENOUS | Status: DC
  Filled 2024-06-30: qty 250

## 2024-06-30 MED ORDER — DEXAMETHASONE SOD PHOSPHATE PF 10 MG/ML IJ SOLN
10.0000 mg | Freq: Once | INTRAMUSCULAR | Status: AC
  Administered 2024-06-30: 10:00:00 10 mg via INTRAVENOUS

## 2024-06-30 MED ORDER — SODIUM CHLORIDE 0.9 % IV SOLN
375.0000 mg/m2 | Freq: Once | INTRAVENOUS | Status: AC
  Administered 2024-06-30: 11:00:00 700 mg via INTRAVENOUS
  Filled 2024-06-30: qty 20

## 2024-06-30 MED ORDER — ACETAMINOPHEN 325 MG PO TABS
650.0000 mg | ORAL_TABLET | Freq: Once | ORAL | Status: AC
  Administered 2024-06-30: 10:00:00 650 mg via ORAL
  Filled 2024-06-30: qty 2

## 2024-06-30 NOTE — Progress Notes (Signed)
 [facility] CONSULT NOTE  Patient Care Team: Alla Amis, [name], [name], [name], [name]  CHIEF COMPLAINTS/PURPOSE OF CONSULTATION: Lymphoma   HEMATOLOGY HISTORY  # ANEMIA[Hb; MCV-platelets- WBC; Iron  sat; ferritin;  GFR- CT/US ; EGD/colonoscopy-  Oncology History   No history exists.   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  Coexpression of CLL.  MYD 88 pending OCT 2025-PET scan: extensive hypermetabolic perilymphatic nodularity with nodular and masslike areas of consolidation in the lungs as well as borderline hypermetabolic mediastinal lymph nodes and hypermetabolic abdominal retroperitoneal and mesenteric lymph nodes.   # NOV 17th, 2025-Bendamustine -rituximab -Bendamustine  day 2 every 28-day cycle.    HISTORY OF PRESENTING ILLNESS: Patient ambulating-independently. With husband.    Kaylee Gordon 63 y.o.  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 She remains concerned about potential chemotherapy-induced alopecia but has  not experienced hair loss to date.  She reports significant improvement in oral mucositis following use of a compounded mouthwash.  She notes marked improvement in respiratory symptoms, with resolution of cough and normalization of oxygen saturation from the mid-80s to the high 90s, which she attributes in part to a prior breathing treatment.  Fatigue persists, characterized by easy fatigability, but she otherwise reports a stable week without new or worsening symptoms. Weight and blood pressure remain stable, though she notes occasional fluctuations in blood pressure.       Review of Systems  Constitutional:  Positive for malaise/fatigue. Negative for chills, diaphoresis, fever and weight loss.  HENT:  Negative for nosebleeds and sore throat.   Eyes:  Negative for double vision.  Respiratory:  Negative for cough, hemoptysis, sputum production, shortness of breath and wheezing.   Cardiovascular:  Negative for chest pain, palpitations, orthopnea and leg swelling.  Gastrointestinal:  Negative for abdominal pain, blood in stool, constipation, diarrhea, heartburn, melena, nausea and vomiting.  Genitourinary:  Negative for dysuria, frequency and urgency.  Musculoskeletal:  Positive for back pain and joint pain.  Skin: Negative.  Negative for itching and rash.  Neurological:  Negative for dizziness, tingling, focal weakness, weakness and headaches.  Endo/Heme/Allergies:  Does not bruise/bleed easily.  Psychiatric/Behavioral:  Negative for depression. The patient is not nervous/anxious and does not have insomnia.      MEDICAL HISTORY:  Past Medical History:  Diagnosis Date   Anxiety    Asthma    B12 deficiency    Bipolar disorder (HCC)    Borderline diabetes    CHF (congestive heart failure) (HCC)    Colon polyps    Depression    Dyspnea    Family history of adverse reaction to anesthesia    mom and dad-delayed emergence   Fibromyalgia    Hilar adenopathy  History of hiatal hernia     History of melanoma    History of methicillin resistant staphylococcus aureus (MRSA) 2010   Hypertension    Hypoparathyroidism    Hypothyroidism    IDA (iron  deficiency anemia)    Lung nodules    Mediastinal adenopathy    PONV (postoperative nausea and vomiting)    delayed emergence   Prolonged Q-T interval on ECG    Thyroid  disease     SURGICAL HISTORY: Past Surgical History:  Procedure Laterality Date   ANKLE SURGERY Right 2012   APPENDECTOMY     AUGMENTATION MAMMAPLASTY Bilateral    CESAREAN SECTION     x2   COLONOSCOPY WITH PROPOFOL  N/A 02/22/2015   Procedure: COLONOSCOPY WITH PROPOFOL ;  Surgeon: Donnice Vaughn Manes, MD;  Location: [facility] ENDOSCOPY;  Service: Endoscopy;  Laterality: N/A;   COLONOSCOPY WITH PROPOFOL  N/A 08/08/2022   Procedure: COLONOSCOPY WITH PROPOFOL ;  Surgeon: Maryruth Ole DASEN, MD;  Location: [facility] ENDOSCOPY;  Service: Endoscopy;  Laterality: N/A;   ESOPHAGOGASTRODUODENOSCOPY (EGD) WITH PROPOFOL  N/A 02/22/2015   Procedure: ESOPHAGOGASTRODUODENOSCOPY (EGD) WITH PROPOFOL ;  Surgeon: Donnice Vaughn Manes, MD;  Location: [facility] ENDOSCOPY;  Service: Endoscopy;  Laterality: N/A;   ESOPHAGOGASTRODUODENOSCOPY (EGD) WITH PROPOFOL  N/A 02/11/2016   Procedure: ESOPHAGOGASTRODUODENOSCOPY (EGD) WITH PROPOFOL ;  Surgeon: Lamar DASEN Holmes, MD;  Location: [facility] ENDOSCOPY;  Service: Endoscopy;  Laterality: N/A;   ESOPHAGOGASTRODUODENOSCOPY (EGD) WITH PROPOFOL  N/A 08/08/2022   Procedure: ESOPHAGOGASTRODUODENOSCOPY (EGD) WITH PROPOFOL ;  Surgeon: Maryruth Ole DASEN, MD;  Location: [facility] ENDOSCOPY;  Service: Endoscopy;  Laterality: N/A;   HEMORRHOID SURGERY     IR IMAGING GUIDED PORT INSERTION  05/22/2024   NASAL SINUS SURGERY     SAVORY DILATION N/A 02/22/2015   Procedure: SAVORY DILATION;  Surgeon: Donnice Vaughn Manes, MD;  Location: [facility] ENDOSCOPY;  Service: Endoscopy;  Laterality: N/A;   SAVORY DILATION N/A 02/11/2016   Procedure: SAVORY DILATION;  Surgeon: Lamar DASEN Holmes,  MD;  Location: [facility] ENDOSCOPY;  Service: Endoscopy;  Laterality: N/A;   SKIN CANCER EXCISION     VIDEO BRONCHOSCOPY WITH ENDOBRONCHIAL NAVIGATION N/A 04/11/2024   Procedure: VIDEO BRONCHOSCOPY WITH ENDOBRONCHIAL NAVIGATION;  Surgeon: Parris Manna, MD;  Location: [facility] ORS;  Service: Thoracic;  Laterality: N/A;   VIDEO BRONCHOSCOPY WITH ENDOBRONCHIAL ULTRASOUND N/A 04/11/2024   Procedure: BRONCHOSCOPY, WITH EBUS;  Surgeon: Parris Manna, MD;  Location: [facility] ORS;  Service: Thoracic;  Laterality: N/A;    SOCIAL HISTORY: Social History   Socioeconomic History   Marital status: Married    Spouse name: Ozell   Number of children: Not on file   Years of education: Not on file   Highest education level: Not on file  Occupational History   Not on file  Tobacco Use   Smoking status: Never   Smokeless tobacco: Never  Vaping Use   Vaping status: Never Used  Substance and Sexual Activity   Alcohol use: Yes    Comment: rare wine   Drug use: No   Sexual activity: Not Currently    Partners: Male  Other Topics Concern   Not on file  Social History Narrative   Lives in [place]; taught high school- math; retd; no smoking; social alcohol.    Social Drivers of Health   Tobacco Use: Low Risk (06/30/2024)   Patient History    Smoking Tobacco Use: Never    Smokeless Tobacco Use: Never    Passive Exposure: Not on file  Financial Resource Strain: Low Risk  (08/07/2023)   Received from [facility]   Overall Financial Resource Strain (CARDIA)    Difficulty of Paying Living Expenses: Not hard at all  Food Insecurity: No Food Insecurity (02/25/2024)   Epic    Worried About Programme Researcher, Broadcasting/film/video in the Last Year: Never true    Ran Out of Food in the Last Year: Never true  Transportation Needs: No Transportation Needs (02/25/2024)   Epic    Lack of Transportation (Medical): No    Lack of Transportation (Non-Medical): No  Physical Activity: Not on file  Stress: Not on file   Social Connections: Not on file  Intimate Partner Violence: Not At Risk (02/25/2024)   Epic    Fear of Current or Ex-Partner: No    Emotionally Abused: No    Physically Abused: No    Sexually Abused: No  Depression (PHQ2-9): Medium Risk (06/30/2024)   Depression (PHQ2-9)    PHQ-2 Score: 9  Alcohol Screen: Not on file  Housing: Low Risk  (04/16/2024)   Received from [facility]   Epic    In the last 12 months, was there a time when you were not able to pay the mortgage or rent on time?: No    In the past 12 months, how many times have you moved where you were living?: 0    At any time in the past 12 months, were you homeless or living in a shelter (including now)?: No  Utilities: Not At Risk (02/25/2024)   Epic    Threatened with loss of utilities: No  Health Literacy: Not on file    FAMILY HISTORY: Family History  Problem Relation Age of Onset   Cancer Mother        colon cancer   Depression Mother    Anxiety disorder Mother    Heart disease Father    Cancer Father        ? neck cancer- still living.    Anxiety disorder Father    Depression Father    Colon cancer Maternal Aunt        died in 41s.    Hypoparathyroidism Son     ALLERGIES:  is allergic to amoxicillin , chlorzoxazone, levofloxacin, atorvastatin, hydrochlorothiazide, pollen extract, and pseudoephedrine.  MEDICATIONS:  Current Outpatient Medications  Medication Sig Dispense Refill   acyclovir  (ZOVIRAX ) 400 MG tablet Take 1 tablet (400 mg total) by mouth daily for 90 doses. 90 tablet 0   allopurinol  (ZYLOPRIM ) 300 MG tablet Take 1 tablet (300 mg total) by mouth daily for 180 doses. 90 tablet 1   benzonatate  (TESSALON ) 100 MG capsule Take 1 capsule (100 mg total) by mouth 3 (three) times daily as needed for cough. 20 capsule 0   busPIRone  (BUSPAR ) 10 MG tablet TAKE 1 TABLET BY MOUTH 3 TIMES A DAY * MAY TAKE 1 EXTRA TABLET AS NEEDED FOR SEVERE ANXIETY* 120 tablet 2   calcitRIOL  (ROCALTROL ) 0.25  MCG capsule Take 1 capsule (0.25 mcg total) by mouth daily. 90 capsule 3   escitalopram  (LEXAPRO ) 10 MG tablet TAKE 1.5 TABLET BY MOUTH DAILY 135 tablet 1   esomeprazole (NEXIUM) 40 MG capsule Take 1 capsule by mouth every morning.     fenofibrate 160 MG tablet Take 1 tablet by mouth daily.     fluticasone-salmeterol (ADVAIR) 250-50 MCG/ACT AEPB Inhale 1 puff into the lungs in the morning and at bedtime.     gabapentin  (NEURONTIN ) 300 MG capsule Take 300 mg by mouth See admin instructions. 2 CAP AM, 1 CAP Q  noon, 2 CAP pm,     ipratropium (ATROVENT ) 0.03 % nasal spray Place 1 spray into both nostrils 2 (two) times daily.     isosorbide mononitrate (IMDUR) 30 MG 24 hr tablet Take 30 mg by mouth 2 (two) times daily.     lamoTRIgine  (LAMICTAL ) 200 MG tablet Take 0.5 tablets (100 mg total) by mouth 2 (two) times daily. 90 tablet 1   lamoTRIgine  (LAMICTAL ) 25 MG tablet Take 1 tablet (25 mg total) by mouth daily. Take along with 200 mg daily , total of 225 mg daily 30 tablet 1   levothyroxine  (SYNTHROID ) 75 MCG tablet Take 1 tablet (75 mcg total) by mouth daily. 90 tablet 3   lidocaine -prilocaine  (EMLA ) cream Apply 1 Application topically as needed. 30 g 1   lisinopril (ZESTRIL) 10 MG tablet Take 10 mg by mouth daily.     lovastatin (MEVACOR) 20 MG tablet Take 20 mg by mouth at bedtime.     magic mouthwash w/lidocaine  SOLN Take 5 mLs by mouth 4 (four) times daily. 480 mL 3   montelukast (SINGULAIR) 10 MG tablet Take 10 mg by mouth at bedtime.     ondansetron  (ZOFRAN ) 8 MG tablet Take 1 tablet (8 mg total) by mouth every 8 (eight) hours as needed for nausea or vomiting (nausea related to iron  infusions). 60 tablet 0   prazosin  (MINIPRESS ) 2 MG capsule Take 1 capsule (2 mg total) by mouth at bedtime. 90 capsule 1   prochlorperazine  (COMPAZINE ) 10 MG tablet Take 1 tablet (10 mg total) by mouth every 6 (six) hours as needed for nausea or vomiting. 30 tablet 1   propranolol ER (INDERAL LA) 80 MG 24 hr  capsule Take 80 mg by mouth every morning.     RESTASIS 0.05 % ophthalmic emulsion Place 1 drop into both eyes 2 (two) times daily.     rOPINIRole  (REQUIP ) 0.25 MG tablet TAKE 1 TABLET BY MOUTH 3 TIMES A DAY 90 tablet 2   temazepam  (RESTORIL ) 30 MG capsule Take 1 capsule (30 mg total) by mouth at bedtime. 30 capsule 2   TRYPTYR 0.003 % SOLN Apply 1 drop to eye 2 (two) times daily.     No current facility-administered medications for this visit.   Facility-Administered Medications Ordered in Other Visits  Medication Dose Route Frequency Provider Last Rate Last Admin   0.9 %  sodium chloride  infusion   Intravenous Continuous Aquila Delaughter R, MD 10 mL/hr at 06/30/24 0954 New Bag at 06/30/24 0954   bendamustine  (BENDEKA ) 160 mg in sodium chloride  0.9 % 50 mL (2.8369 mg/mL) chemo infusion  90 mg/m2 (Treatment Plan Recorded) Intravenous Once Tanmay Halteman R, MD       riTUXimab -pvvr (RUXIENCE ) 700 mg in sodium chloride  0.9 % 180 mL infusion  375 mg/m2 (Treatment Plan Recorded) Intravenous Once Zackary Sydell DEL, RN       PHYSICAL EXAMINATION:   Vitals:   06/30/24 0859 06/30/24 0908  BP: (!) 147/133 111/65  Pulse: 77   Resp: 16   Temp: (!) 97 F (36.1 C)   SpO2: 98%    Filed Weights   06/30/24 0859  Weight: 155 lb 6.4 oz (70.5 kg)     Physical Exam Vitals and nursing note reviewed.  HENT:     Head: Normocephalic and atraumatic.     Mouth/Throat:     Pharynx: Oropharynx is clear.  Eyes:     Extraocular Movements: Extraocular movements intact.     Pupils: Pupils are equal, round, and  reactive to light.  Cardiovascular:     Rate and Rhythm: Normal rate and regular rhythm.  Pulmonary:     Comments: Decreased breath sounds bilaterally.  Abdominal:     Palpations: Abdomen is soft.  Musculoskeletal:        General: Normal range of motion.     Cervical back: Normal range of motion.  Skin:    General: Skin is warm.  Neurological:     General: No focal deficit present.      Mental Status: She is alert and oriented to person, place, and time.  Psychiatric:        Behavior: Behavior normal.        Judgment: Judgment normal.      LABORATORY DATA:  I have reviewed the data as listed Lab Results  Component Value Date   WBC 5.7 06/30/2024   HGB 10.8 (L) 06/30/2024   HCT 34.7 (L) 06/30/2024   MCV 82.8 06/30/2024   PLT 215 06/30/2024   Recent Labs    06/02/24 0810 06/15/24 1516 06/30/24 0851  NA 143 139 142  K 3.8 3.9 3.7  CL 107 105 106  CO2 25 22 24   GLUCOSE 103* 100* 124*  BUN 17 19 14   CREATININE 1.24* 0.97 1.25*  CALCIUM 9.4 9.0 9.4  GFRNONAA 48* >60 48*  PROT 6.2* 6.5 6.7  ALBUMIN 4.4 4.2 4.4  AST 24 56* 20  ALT 15 47* 17  ALKPHOS 84 98 74  BILITOT 0.3 <0.2 0.3     DG Chest Portable 1 View Result Date: 06/15/2024 CLINICAL DATA:  Cough. EXAM: PORTABLE CHEST 1 VIEW COMPARISON:  04/11/2024 and older studies.  CT, 04/09/2024. FINDINGS: Cardiac silhouette is normal in size. No mediastinal or hilar masses. Right anterior chest wall Port-A-Cath has its tip at the caval atrial junction, new since the prior exam. Diffuse interstitial thickening, less prominent than on the most recent prior chest radiograph. No lung consolidation. No pleural effusion or pneumothorax. Skeletal structures are grossly intact IMPRESSION: No acute cardiopulmonary disease. Electronically Signed   By: Alm Parkins M.D.   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  Coexpression of CLL.  MYD 88 pending OCT 2025-PET scan: extensive hypermetabolic perilymphatic nodularity with nodular and masslike areas of consolidation in the lungs as well as borderline hypermetabolic mediastinal lymph nodes and hypermetabolic abdominal retroperitoneal and mesenteric lymph nodes.  Currently on Benda-Rituximab   # Proceed with cycle l #2 Bendamustine -rituximab -Bendamustine  day 2 every 28-day cycle. Will plan a PET scan after 3 cycle.  Clinically improved post cycle #1-with improvement of cough.  Tolerating fairly well.  # CKD- ? Stage III- recommend fluids-   # Oral mucositis- improved with magic mouth wash-   # Iron  deficiency anemia-?  Hiatal hernia.  Monitor for now  # Anxiety: Monitor closely on chemotherapy.  # Pt has a sheet of paper with info that her [employer] needs for her cancer policy   # IV access: sp port placement.    # DISPOSITION: #  in 4 weeks- MD; port lab- cbc/cmp; benda-Ritux; LDH;; D-2 benda-Dr.B     Cindy JONELLE Joe, MD 06/30/2024 10:10 AM

## 2024-06-30 NOTE — Progress Notes (Signed)
 Rapid Infusion Rituximab  Pharmacist Evaluation  MAYERLY KAMAN is a 63 y.o. female being treated with rituximab  for lymphoma. This patient may be considered for RIR.   A pharmacist has verified the patient tolerated rituximab  infusions per the [facility] standard infusion protocol without grade 3-4 infusion reactions. The treatment plan will be updated to reflect RIR if the patient qualifies per the checklist below:   Age > 72 years old Yes   Clinically significant cardiovascular disease No   Circulating lymphocyte count < 5000/uL prior to cycle two Yes  Lab Results  Component Value Date   LYMPHSABS 0.4 (L) 06/30/2024    Prior documented grade 3-4 infusion reaction to rituximab  No   Prior documented grade 1-2 infusion reaction to rituximab  (If YES, Pharmacist will confirm with Physician if patient is still a candidate for RIR) No   Previous rituximab  infusion within the past 6 months Yes   Treatment Plan updated orders to reflect RIR Yes    Horacio MYLEI BRACKEEN does meet the criteria for Rapid Infusion Rituximab . This patient is going to be switched to rapid infusion rituximab .   Maudie FORBES Andreas, PharmD, BCPS Clinical Pharmacist   06/30/2024 9:25 AM

## 2024-06-30 NOTE — Assessment & Plan Note (Addendum)
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  Coexpression of CLL.  MYD 88 pending OCT 2025-PET scan: extensive hypermetabolic perilymphatic nodularity with nodular and masslike areas of consolidation in the lungs as well as borderline hypermetabolic mediastinal lymph nodes and hypermetabolic abdominal retroperitoneal and mesenteric lymph nodes. Currently on Benda-Rituximab   # Proceed with cycle l #2 Bendamustine -rituximab -Bendamustine  day 2 every 28-day cycle. Will plan a PET scan after 3 cycle.  Clinically improved post cycle #1-with improvement of cough.  Tolerating fairly well.  # CKD- ? Stage III- recommend fluids-   # Oral mucositis- improved with magic mouth wash-   # Iron  deficiency anemia-?  Hiatal hernia.  Monitor for now  # Anxiety: Monitor closely on chemotherapy.  # Pt has a sheet of paper with info that her [employer] needs for her cancer policy   # IV access: sp port placement.    # DISPOSITION: #  in 4 weeks- MD; port lab- cbc/cmp; benda-Ritux; LDH;; D-2 benda-Dr.B

## 2024-06-30 NOTE — Progress Notes (Signed)
 Pt has a sheet of paper with info that her [employer] needs for her cancer policy.

## 2024-06-30 NOTE — Patient Instructions (Signed)

## 2024-07-01 ENCOUNTER — Other Ambulatory Visit: Payer: Self-pay | Admitting: Internal Medicine

## 2024-07-01 ENCOUNTER — Encounter: Payer: Self-pay | Admitting: *Deleted

## 2024-07-01 ENCOUNTER — Inpatient Hospital Stay

## 2024-07-01 VITALS — BP 90/61 | HR 74 | Temp 98.6°F

## 2024-07-01 DIAGNOSIS — Z5111 Encounter for antineoplastic chemotherapy: Secondary | ICD-10-CM | POA: Diagnosis not present

## 2024-07-01 DIAGNOSIS — D47Z9 Other specified neoplasms of uncertain behavior of lymphoid, hematopoietic and related tissue: Secondary | ICD-10-CM

## 2024-07-01 DIAGNOSIS — D508 Other iron deficiency anemias: Secondary | ICD-10-CM

## 2024-07-01 MED ORDER — SODIUM CHLORIDE 0.9 % IV SOLN
90.0000 mg/m2 | Freq: Once | INTRAVENOUS | Status: AC
  Administered 2024-07-01: 14:00:00 160 mg via INTRAVENOUS
  Filled 2024-07-01: qty 6.4

## 2024-07-01 MED ORDER — SODIUM CHLORIDE 0.9 % IV SOLN
Freq: Once | INTRAVENOUS | Status: AC
  Filled 2024-07-01: qty 250

## 2024-07-01 MED ORDER — SODIUM CHLORIDE 0.9 % IV SOLN
INTRAVENOUS | Status: DC
  Filled 2024-07-01: qty 250

## 2024-07-01 MED ORDER — DEXAMETHASONE SOD PHOSPHATE PF 10 MG/ML IJ SOLN
10.0000 mg | Freq: Once | INTRAMUSCULAR | Status: AC
  Administered 2024-07-01: 14:00:00 10 mg via INTRAVENOUS

## 2024-07-01 NOTE — Patient Instructions (Signed)
 [facility] - A DEPT OF [facility]  Discharge Instructions: Thank you for choosing [facility] to provide your oncology and hematology care.  If you have a lab appointment with the Cancer Center, please go directly to the Cancer Center and check in at the registration area.  Wear comfortable clothing and clothing appropriate for easy access to any Portacath or PICC line.   We strive to give you quality time with your provider. You may need to reschedule your appointment if you arrive late (15 or more minutes).  Arriving late affects you and other patients whose appointments are after yours.  Also, if you miss three or more appointments without notifying the office, you may be dismissed from the clinic at the providers discretion.      For prescription refill requests, have your pharmacy contact our office and allow 72 hours for refills to be completed.    Today you received the following chemotherapy and/or immunotherapy agents: bendamustine  (BENDEKA )       To help prevent nausea and vomiting after your treatment, we encourage you to take your nausea medication as directed.  BELOW ARE SYMPTOMS THAT SHOULD BE REPORTED IMMEDIATELY: *FEVER GREATER THAN 100.4 F (38 C) OR HIGHER *CHILLS OR SWEATING *NAUSEA AND VOMITING THAT IS NOT CONTROLLED WITH YOUR NAUSEA MEDICATION *UNUSUAL SHORTNESS OF BREATH *UNUSUAL BRUISING OR BLEEDING *URINARY PROBLEMS (pain or burning when urinating, or frequent urination) *BOWEL PROBLEMS (unusual diarrhea, constipation, pain near the anus) TENDERNESS IN MOUTH AND THROAT WITH OR WITHOUT PRESENCE OF ULCERS (sore throat, sores in mouth, or a toothache) UNUSUAL RASH, SWELLING OR PAIN  UNUSUAL VAGINAL DISCHARGE OR ITCHING   Items with * indicate a potential emergency and should be followed up as soon as possible or go to the Emergency Department if any problems should occur.  Please show the CHEMOTHERAPY ALERT CARD or  IMMUNOTHERAPY ALERT CARD at check-in to the Emergency Department and triage nurse.  Should you have questions after your visit or need to cancel or reschedule your appointment, please contact [facility] - A DEPT OF [facility]  [phone] and follow the prompts.  Office hours are 8:00 a.m. to 4:30 p.m. Monday - Friday. Please note that voicemails left after 4:00 p.m. may not be returned until the following business day.  We are closed weekends and major holidays. You have access to a nurse at all times for urgent questions. Please call the main number to the clinic [phone] and follow the prompts.  For any non-urgent questions, you may also contact your provider using MyChart. We now offer e-Visits for anyone 38 and older to request care online for non-urgent symptoms. For details visit [email or web address].   Also download the MyChart app! Go to the app store, search MyChart, open the app, select [facility], and log in with your MyChart username and password.

## 2024-07-02 ENCOUNTER — Telehealth: Payer: Self-pay | Admitting: *Deleted

## 2024-07-02 NOTE — Telephone Encounter (Signed)
 [employer] Cancer Claim Form Initiated and faxed to [phone].

## 2024-07-04 ENCOUNTER — Other Ambulatory Visit: Payer: Self-pay | Admitting: Psychiatry

## 2024-07-04 DIAGNOSIS — G2581 Restless legs syndrome: Secondary | ICD-10-CM

## 2024-07-04 DIAGNOSIS — G4701 Insomnia due to medical condition: Secondary | ICD-10-CM

## 2024-07-20 ENCOUNTER — Other Ambulatory Visit: Payer: Self-pay | Admitting: Psychiatry

## 2024-07-20 DIAGNOSIS — F3131 Bipolar disorder, current episode depressed, mild: Secondary | ICD-10-CM

## 2024-07-22 ENCOUNTER — Other Ambulatory Visit: Payer: Self-pay | Admitting: Psychiatry

## 2024-07-22 ENCOUNTER — Other Ambulatory Visit: Payer: Self-pay

## 2024-07-22 ENCOUNTER — Ambulatory Visit: Admitting: Psychiatry

## 2024-07-22 ENCOUNTER — Encounter: Payer: Self-pay | Admitting: Psychiatry

## 2024-07-22 VITALS — BP 110/60 | HR 81 | Temp 97.8°F | Ht 64.0 in | Wt 158.0 lb

## 2024-07-22 DIAGNOSIS — F3176 Bipolar disorder, in full remission, most recent episode depressed: Secondary | ICD-10-CM | POA: Diagnosis not present

## 2024-07-22 DIAGNOSIS — F3131 Bipolar disorder, current episode depressed, mild: Secondary | ICD-10-CM

## 2024-07-22 DIAGNOSIS — F411 Generalized anxiety disorder: Secondary | ICD-10-CM | POA: Diagnosis not present

## 2024-07-22 DIAGNOSIS — G4701 Insomnia due to medical condition: Secondary | ICD-10-CM | POA: Diagnosis not present

## 2024-07-22 MED ORDER — PRAZOSIN HCL 2 MG PO CAPS: 2.0000 mg | ORAL_CAPSULE | Freq: Every day | ORAL | 1 refills | Status: AC

## 2024-07-22 MED ORDER — LAMOTRIGINE 200 MG PO TABS: 100.0000 mg | ORAL_TABLET | Freq: Two times a day (BID) | ORAL | 1 refills | Status: AC

## 2024-07-22 NOTE — Progress Notes (Unsigned)
 [facility] MD OP Progress Note  07/22/2024 4:08 PM [name]  MRN:  [id number]  Chief Complaint:  Chief Complaint  Patient presents with   Follow-up   Depression   Anxiety   Insomnia   Discussed the use of AI scribe software for clinical note transcription with the patient, who gave verbal consent to proceed.  History of Present Illness Kaylee Gordon is a 64 year old Caucasian female, has a history of bipolar disorder, GAD, fibromyalgia, recent diagnosis of low-grade B-cell lymphoma was evaluated in office today for a follow-up appointment.  Over the past week, she has felt tired and unwell, reporting that stress and demands from the holiday season contribute to her fatigue and low energy. She has found holiday activities overwhelming, even though she participates less than in previous years, and she has needed to rest after exertion. She has scaled down her usual activities, such as decorating for Christmas, and relies on her family for support with these tasks.  She has experienced periods of low mood and decreased interest in previously enjoyable activities, such as shopping, and she no longer feels motivated to engage in these activities. She reports emotional variability, with some days being better than others. At night, she sometimes struggles with thoughts about her illness and mortality, especially when her mind races. To cope, she uses strategies such as texting with a long-time friend, watching television for distraction, and practicing breathing and stretching exercises at night to manage anxiety and facilitate sleep.  She however attributes a lot of her symptoms to her current cancer diagnosis and treatment.  She generally sleeps well, as she often falls asleep while watching television and typically sleeps through the night. She reports decreased appetite, and many foods do not taste good to her, so she has lost weight since October. She reports lack of interest and low energy,  and notes that she has not been feeling well during ongoing medical treatment.  She is currently compliant on her medications like Lamictal , Lexapro , temazepam , prazosin , Requip .  Denies side effects.  She continues to follow-up with her therapist Ms. Ellouise Hummer.  She is currently undergoing treatment for lymphoma, undergoing rituximab  infusions q. 28 days x 6 cycles.   Visit Diagnosis:    ICD-10-CM   1. Bipolar 1 disorder, depressed, full remission  F31.76 lamoTRIgine  (LAMICTAL ) 200 MG tablet    2. GAD (generalized anxiety disorder)  F41.1     3. Insomnia due to medical condition  G47.01 prazosin  (MINIPRESS ) 2 MG capsule   anxiety, RLS      Past Psychiatric History: I have reviewed past psychiatric history from progress note on 09/11/2018.  Past trials of medications like Ambien, Lunesta , Sonata , Belsomra   Past Medical History:  Past Medical History:  Diagnosis Date   Anxiety    Asthma    B12 deficiency    Bipolar disorder (HCC)    Borderline diabetes    CHF (congestive heart failure) (HCC)    Colon polyps    Depression    Dyspnea    Family history of adverse reaction to anesthesia    mom and dad-delayed emergence   Fibromyalgia    Hilar adenopathy    History of hiatal hernia    History of melanoma    History of methicillin resistant staphylococcus aureus (MRSA) 2010   Hypertension    Hypoparathyroidism    Hypothyroidism    IDA (iron  deficiency anemia)    Lung nodules    Mediastinal adenopathy    PONV (postoperative nausea  and vomiting)    delayed emergence   Prolonged Q-T interval on ECG    Thyroid  disease     Past Surgical History:  Procedure Laterality Date   ANKLE SURGERY Right 2012   APPENDECTOMY     AUGMENTATION MAMMAPLASTY Bilateral    CESAREAN SECTION     x2   COLONOSCOPY WITH PROPOFOL  N/A 02/22/2015   Procedure: COLONOSCOPY WITH PROPOFOL ;  Surgeon: Donnice Vaughn Manes, MD;  Location: [facility] ENDOSCOPY;  Service: Endoscopy;  Laterality: N/A;    COLONOSCOPY WITH PROPOFOL  N/A 08/08/2022   Procedure: COLONOSCOPY WITH PROPOFOL ;  Surgeon: Maryruth Ole DASEN, MD;  Location: [facility] ENDOSCOPY;  Service: Endoscopy;  Laterality: N/A;   ESOPHAGOGASTRODUODENOSCOPY (EGD) WITH PROPOFOL  N/A 02/22/2015   Procedure: ESOPHAGOGASTRODUODENOSCOPY (EGD) WITH PROPOFOL ;  Surgeon: Donnice Vaughn Manes, MD;  Location: [facility] ENDOSCOPY;  Service: Endoscopy;  Laterality: N/A;   ESOPHAGOGASTRODUODENOSCOPY (EGD) WITH PROPOFOL  N/A 02/11/2016   Procedure: ESOPHAGOGASTRODUODENOSCOPY (EGD) WITH PROPOFOL ;  Surgeon: Lamar DASEN Holmes, MD;  Location: [facility] ENDOSCOPY;  Service: Endoscopy;  Laterality: N/A;   ESOPHAGOGASTRODUODENOSCOPY (EGD) WITH PROPOFOL  N/A 08/08/2022   Procedure: ESOPHAGOGASTRODUODENOSCOPY (EGD) WITH PROPOFOL ;  Surgeon: Maryruth Ole DASEN, MD;  Location: [facility] ENDOSCOPY;  Service: Endoscopy;  Laterality: N/A;   HEMORRHOID SURGERY     IR IMAGING GUIDED PORT INSERTION  05/22/2024   NASAL SINUS SURGERY     SAVORY DILATION N/A 02/22/2015   Procedure: SAVORY DILATION;  Surgeon: Donnice Vaughn Manes, MD;  Location: [facility] ENDOSCOPY;  Service: Endoscopy;  Laterality: N/A;   SAVORY DILATION N/A 02/11/2016   Procedure: SAVORY DILATION;  Surgeon: Lamar DASEN Holmes, MD;  Location: [facility] ENDOSCOPY;  Service: Endoscopy;  Laterality: N/A;   SKIN CANCER EXCISION     VIDEO BRONCHOSCOPY WITH ENDOBRONCHIAL NAVIGATION N/A 04/11/2024   Procedure: VIDEO BRONCHOSCOPY WITH ENDOBRONCHIAL NAVIGATION;  Surgeon: Parris Manna, MD;  Location: [facility] ORS;  Service: Thoracic;  Laterality: N/A;   VIDEO BRONCHOSCOPY WITH ENDOBRONCHIAL ULTRASOUND N/A 04/11/2024   Procedure: BRONCHOSCOPY, WITH EBUS;  Surgeon: Parris Manna, MD;  Location: [facility] ORS;  Service: Thoracic;  Laterality: N/A;    Family Psychiatric History: I have reviewed family psychiatric history from progress note on 09/11/2018  Family History:  Family History  Problem Relation Age of Onset   Cancer Mother        colon cancer    Depression Mother    Anxiety disorder Mother    Heart disease Father    Cancer Father        ? neck cancer- still living.    Anxiety disorder Father    Depression Father    Colon cancer Maternal Aunt        died in 69s.    Hypoparathyroidism Son     Social History: I have reviewed social history from progress note on 09/11/2018 Social History   Socioeconomic History   Marital status: Married    Spouse name: Ozell   Number of children: Not on file   Years of education: Not on file   Highest education level: Not on file  Occupational History   Not on file  Tobacco Use   Smoking status: Never   Smokeless tobacco: Never  Vaping Use   Vaping status: Never Used  Substance and Sexual Activity   Alcohol use: Yes    Comment: rare wine   Drug use: No   Sexual activity: Not Currently    Partners: Male  Other Topics Concern   Not on file  Social History Narrative   Lives in [place]; taught  high school- math; retd; no smoking; social alcohol.    Social Drivers of Health   Tobacco Use: Low Risk (07/22/2024)   Patient History    Smoking Tobacco Use: Never    Smokeless Tobacco Use: Never    Passive Exposure: Not on file  Financial Resource Strain: Low Risk  (08/07/2023)   Received from [facility]   Overall Financial Resource Strain (CARDIA)    Difficulty of Paying Living Expenses: Not hard at all  Food Insecurity: No Food Insecurity (02/25/2024)   Epic    Worried About Running Out of Food in the Last Year: Never true    Ran Out of Food in the Last Year: Never true  Transportation Needs: No Transportation Needs (02/25/2024)   Epic    Lack of Transportation (Medical): No    Lack of Transportation (Non-Medical): No  Physical Activity: Not on file  Stress: Not on file  Social Connections: Not on file  Depression (PHQ2-9): High Risk (07/22/2024)   Depression (PHQ2-9)    PHQ-2 Score: 12  Alcohol Screen: Not on file  Housing: Low Risk  (04/16/2024)    Received from [facility]   Epic    In the last 12 months, was there a time when you were not able to pay the mortgage or rent on time?: No    In the past 12 months, how many times have you moved where you were living?: 0    At any time in the past 12 months, were you homeless or living in a shelter (including now)?: No  Utilities: Not At Risk (02/25/2024)   Epic    Threatened with loss of utilities: No  Health Literacy: Not on file    Allergies: Allergies[1]  Metabolic Disorder Labs: Lab Results  Component Value Date   HGBA1C 5.9 (H) 02/16/2021   Lab Results  Component Value Date   PROLACTIN 18.3 07/22/2014   Lab Results  Component Value Date   CHOL 184 02/16/2021   TRIG 179 (H) 02/16/2021   HDL 37 (L) 02/16/2021   CHOLHDL 5.0 (H) 02/16/2021   LDLCALC 115 (H) 02/16/2021   Lab Results  Component Value Date   TSH 1.94 02/12/2024   TSH 2.03 07/24/2023    Therapeutic Level Labs: No results found for: LITHIUM Lab Results  Component Value Date   VALPROATE 39 (L) 02/04/2019   No results found for: CBMZ  Current Medications: Current Outpatient Medications  Medication Sig Dispense Refill   acyclovir  (ZOVIRAX ) 400 MG tablet Take 1 tablet (400 mg total) by mouth daily for 90 doses. 90 tablet 0   allopurinol  (ZYLOPRIM ) 300 MG tablet Take 1 tablet (300 mg total) by mouth daily for 180 doses. 90 tablet 1   benzonatate  (TESSALON ) 100 MG capsule Take 1 capsule (100 mg total) by mouth 3 (three) times daily as needed for cough. 20 capsule 0   busPIRone  (BUSPAR ) 10 MG tablet TAKE 1 TABLET BY MOUTH 3 TIMES A DAY * MAY TAKE 1 EXTRA TABLET AS NEEDED FOR SEVERE ANXIETY* 120 tablet 2   calcitRIOL  (ROCALTROL ) 0.25 MCG capsule Take 1 capsule (0.25 mcg total) by mouth daily. 90 capsule 3   escitalopram  (LEXAPRO ) 10 MG tablet TAKE 1.5 TABLET BY MOUTH DAILY 135 tablet 1   esomeprazole (NEXIUM) 40 MG capsule Take 1 capsule by mouth every morning.     fenofibrate 160 MG  tablet Take 1 tablet by mouth daily.     fluticasone-salmeterol (ADVAIR) 250-50 MCG/ACT AEPB Inhale 1  puff into the lungs in the morning and at bedtime.     gabapentin  (NEURONTIN ) 300 MG capsule Take 300 mg by mouth See admin instructions. 2 CAP AM, 1 CAP Q noon, 2 CAP pm,     ipratropium (ATROVENT ) 0.03 % nasal spray Place 1 spray into both nostrils 2 (two) times daily.     isosorbide mononitrate (IMDUR) 30 MG 24 hr tablet Take 30 mg by mouth 2 (two) times daily.     lamoTRIgine  (LAMICTAL ) 200 MG tablet Take 0.5 tablets (100 mg total) by mouth 2 (two) times daily. 90 tablet 1   lamoTRIgine  (LAMICTAL ) 25 MG tablet TAKE 1 TABLET BY MOUTH DAILY *TAKE ALONG WITH 200 MG DAILY FOR A TOTAL OF 225MG * 30 tablet 0   levothyroxine  (SYNTHROID ) 75 MCG tablet Take 1 tablet (75 mcg total) by mouth daily. 90 tablet 3   lidocaine -prilocaine  (EMLA ) cream Apply 1 Application topically as needed. 30 g 1   lisinopril (ZESTRIL) 10 MG tablet Take 10 mg by mouth daily.     lovastatin (MEVACOR) 20 MG tablet Take 20 mg by mouth at bedtime.     magic mouthwash w/lidocaine  SOLN Take 5 mLs by mouth 4 (four) times daily. 480 mL 3   montelukast (SINGULAIR) 10 MG tablet Take 10 mg by mouth at bedtime.     ondansetron  (ZOFRAN ) 8 MG tablet Take 1 tablet (8 mg total) by mouth every 8 (eight) hours as needed for nausea or vomiting (nausea related to iron  infusions). 60 tablet 0   prazosin  (MINIPRESS ) 2 MG capsule Take 1 capsule (2 mg total) by mouth at bedtime. 90 capsule 1   prochlorperazine  (COMPAZINE ) 10 MG tablet Take 1 tablet (10 mg total) by mouth every 6 (six) hours as needed for nausea or vomiting. 30 tablet 1   propranolol ER (INDERAL LA) 80 MG 24 hr capsule Take 80 mg by mouth every morning.     RESTASIS 0.05 % ophthalmic emulsion Place 1 drop into both eyes 2 (two) times daily.     rOPINIRole  (REQUIP ) 0.25 MG tablet TAKE 1 TABLET BY MOUTH 3 TIMES A DAY 90 tablet 5   temazepam  (RESTORIL ) 30 MG capsule Take 1 capsule (30  mg total) by mouth at bedtime. 30 capsule 2   TRYPTYR 0.003 % SOLN Apply 1 drop to eye 2 (two) times daily.     No current facility-administered medications for this visit.     Musculoskeletal: Strength & Muscle Tone: within normal limits Gait & Station: normal Patient leans: N/A  Psychiatric Specialty Exam: Review of Systems  Psychiatric/Behavioral:  The patient is nervous/anxious.     Blood pressure 110/60, pulse 81, temperature 97.8 F (36.6 C), temperature source Temporal, height 5' 4 (1.626 m), weight 158 lb (71.7 kg).Body mass index is 27.12 kg/m.  General Appearance: Fairly Groomed  Eye Contact:  Fair  Speech:  Clear and Coherent  Volume:  Normal  Mood:  Anxious  Affect:  Appropriate  Thought Process:  Goal Directed and Descriptions of Associations: Intact  Orientation:  Full (Time, Place, and Person)  Thought Content: Logical   Suicidal Thoughts:  No  Homicidal Thoughts:  No  Memory:  Immediate;   Fair Recent;   Fair Remote;   Fair  Judgement:  Fair  Insight:  Fair  Psychomotor Activity:  Normal  Concentration:  Concentration: Fair and Attention Span: Fair  Recall:  [employer] of Knowledge: Fair  Language: Fair  Akathisia:  No  Handed:  Right  AIMS (if  indicated): not done  Assets:  Communication Skills Desire for Improvement Housing Intimacy Social Support Talents/Skills Transportation  ADL's:  Intact  Cognition: WNL  Sleep:  Fair   Screenings: AIMS    Flowsheet Row Video Visit from 12/08/2021 in [facility]  AIMS Total Score 0   GAD-7    Flowsheet Row Office Visit from 07/22/2024 in [facility] Office Visit from 03/18/2024 in [facility] Office Visit from 04/11/2023 in [facility] Office Visit from 11/21/2022 in [facility] Video Visit from 03/28/2022  in [facility]  Total GAD-7 Score 18 21 21 14 10    PHQ2-9    Flowsheet Row Office Visit from 07/22/2024 in [facility] Office Visit from 06/30/2024 in [facility] Dept Of [place]. [facility] Office Visit from 06/09/2024 in [facility] Dept Of [place]. [facility] Office Visit from 06/02/2024 in [facility] Dept Of [place]. [facility] Office Visit from 05/23/2024 in [facility] Dept Of [place]. [facility]  PHQ-2 Total Score 3 0 0 3 3  PHQ-9 Total Score 12 9 6 16  --   Flowsheet Row Office Visit from 07/22/2024 in [facility] Office Visit from 06/30/2024 in [facility] A Dept Of [place]. [facility] Office Visit from 06/17/2024 in [facility] A Dept Of . [facility]  C-SSRS RISK CATEGORY No Risk No Risk No Risk     Assessment and Plan: Kaylee Gordon is a 64 year old Caucasian female who presented for a follow-up appointment, discussed assessment and plan as noted below.  1. Bipolar 1 disorder, depressed, full remission Currently does have anxiety related to her current cancer diagnosis and treatment.  Fatigue and lack of motivation attributed to current chemotherapy.  Reports current medication is beneficial for her mood and is motivated to stay in therapy. Continue Lamotrigine  225 mg daily  2. GAD (generalized anxiety disorder)-improving Family reports improvement in anxiety, working on coping strategies. Continue CBT with Ms. Ellouise Hummer Continue Lexapro  15 mg daily Continue BuSpar  30 mg daily divided dosage with option to take an extra 10 mg as needed  3. Insomnia due to medical condition-stable Currently reports sleep is overall good. Continue Temazepam  30 mg at bedtime Continue  Prazosin  2 mg at bedtime for nightmares Continue Requip  0.25 mg 3 times a day for restless leg symptoms.  Follow-up Follow-up in clinic in 2 to 3 months or sooner if needed.  Collaboration of Care: Collaboration of Care: Referral or follow-up with counselor/therapist AEB encouraged to continue psychotherapy sessions will coordinate care with Ms. Ellouise Hummer  Patient/Guardian was advised Release of Information must be obtained prior to any record release in order to collaborate their care with an outside provider. Patient/Guardian was advised if they have not already done so to contact the registration department to sign all necessary forms in order for us  to release information regarding their care.   Consent: Patient/Guardian gives verbal consent for treatment and assignment of benefits for services provided during this visit. Patient/Guardian expressed understanding and agreed to proceed.   This note was generated in part or whole  with voice recognition software. Voice recognition is usually quite accurate but there are transcription errors that can and very often do occur. I apologize for any typographical errors that were not detected and corrected.    Tahiri Shareef, MD 07/22/2024, 4:08 PM     [1]  Allergies Allergen Reactions   Amoxicillin  Itching and Swelling   Chlorzoxazone Anaphylaxis and Hives   Levofloxacin Other (See Comments) and Nausea Only    Lethargic, Flu-like symptoms, Hot flashes    Atorvastatin    Bee Pollen Other (See Comments)   Hydrochlorothiazide Other (See Comments)    Intolerance - dry lips   Pollen Extract    Pseudoephedrine Other (See Comments)    Heart racing

## 2024-07-23 ENCOUNTER — Other Ambulatory Visit: Payer: Self-pay

## 2024-07-26 ENCOUNTER — Other Ambulatory Visit: Payer: Self-pay | Admitting: Psychiatry

## 2024-07-26 DIAGNOSIS — G4701 Insomnia due to medical condition: Secondary | ICD-10-CM

## 2024-07-28 ENCOUNTER — Inpatient Hospital Stay

## 2024-07-28 ENCOUNTER — Other Ambulatory Visit: Payer: Self-pay | Admitting: Internal Medicine

## 2024-07-28 ENCOUNTER — Inpatient Hospital Stay: Attending: Internal Medicine

## 2024-07-28 ENCOUNTER — Inpatient Hospital Stay: Attending: Internal Medicine | Admitting: Internal Medicine

## 2024-07-28 ENCOUNTER — Encounter: Payer: Self-pay | Admitting: Internal Medicine

## 2024-07-28 ENCOUNTER — Encounter: Payer: Self-pay | Admitting: *Deleted

## 2024-07-28 VITALS — BP 103/56 | HR 78 | Temp 98.5°F | Resp 18

## 2024-07-28 DIAGNOSIS — Z5111 Encounter for antineoplastic chemotherapy: Secondary | ICD-10-CM | POA: Diagnosis not present

## 2024-07-28 DIAGNOSIS — Z5112 Encounter for antineoplastic immunotherapy: Secondary | ICD-10-CM | POA: Diagnosis present

## 2024-07-28 DIAGNOSIS — C8300 Small cell B-cell lymphoma, unspecified site: Secondary | ICD-10-CM | POA: Insufficient documentation

## 2024-07-28 DIAGNOSIS — D47Z9 Other specified neoplasms of uncertain behavior of lymphoid, hematopoietic and related tissue: Secondary | ICD-10-CM

## 2024-07-28 LAB — CBC WITH DIFFERENTIAL (CANCER CENTER ONLY)
Abs Immature Granulocytes: 0.03 K/uL (ref 0.00–0.07)
Basophils Absolute: 0.1 K/uL (ref 0.0–0.1)
Basophils Relative: 1 %
Eosinophils Absolute: 0.2 K/uL (ref 0.0–0.5)
Eosinophils Relative: 4 %
HCT: 32.1 % — ABNORMAL LOW (ref 36.0–46.0)
Hemoglobin: 10.4 g/dL — ABNORMAL LOW (ref 12.0–15.0)
Immature Granulocytes: 1 %
Lymphocytes Relative: 6 %
Lymphs Abs: 0.3 K/uL — ABNORMAL LOW (ref 0.7–4.0)
MCH: 27.4 pg (ref 26.0–34.0)
MCHC: 32.4 g/dL (ref 30.0–36.0)
MCV: 84.7 fL (ref 80.0–100.0)
Monocytes Absolute: 0.8 K/uL (ref 0.1–1.0)
Monocytes Relative: 17 %
Neutro Abs: 3.2 K/uL (ref 1.7–7.7)
Neutrophils Relative %: 71 %
Platelet Count: 236 K/uL (ref 150–400)
RBC: 3.79 MIL/uL — ABNORMAL LOW (ref 3.87–5.11)
RDW: 18.8 % — ABNORMAL HIGH (ref 11.5–15.5)
WBC Count: 4.5 K/uL (ref 4.0–10.5)
nRBC: 0 % (ref 0.0–0.2)

## 2024-07-28 LAB — CMP (CANCER CENTER ONLY)
ALT: 27 U/L (ref 0–44)
AST: 28 U/L (ref 15–41)
Albumin: 4.4 g/dL (ref 3.5–5.0)
Alkaline Phosphatase: 77 U/L (ref 38–126)
Anion gap: 11 (ref 5–15)
BUN: 18 mg/dL (ref 8–23)
CO2: 25 mmol/L (ref 22–32)
Calcium: 9.4 mg/dL (ref 8.9–10.3)
Chloride: 105 mmol/L (ref 98–111)
Creatinine: 1.08 mg/dL — ABNORMAL HIGH (ref 0.44–1.00)
GFR, Estimated: 57 mL/min — ABNORMAL LOW
Glucose, Bld: 130 mg/dL — ABNORMAL HIGH (ref 70–99)
Potassium: 3.8 mmol/L (ref 3.5–5.1)
Sodium: 140 mmol/L (ref 135–145)
Total Bilirubin: <0.2 mg/dL (ref 0.0–1.2)
Total Protein: 6.6 g/dL (ref 6.5–8.1)

## 2024-07-28 MED ORDER — PALONOSETRON HCL INJECTION 0.25 MG/5ML
0.2500 mg | Freq: Once | INTRAVENOUS | Status: AC
  Administered 2024-07-28: 0.25 mg via INTRAVENOUS
  Filled 2024-07-28: qty 5

## 2024-07-28 MED ORDER — DIPHENHYDRAMINE HCL 25 MG PO TABS
50.0000 mg | ORAL_TABLET | Freq: Once | ORAL | Status: AC
  Administered 2024-07-28: 50 mg via ORAL
  Filled 2024-07-28: qty 2

## 2024-07-28 MED ORDER — SODIUM CHLORIDE 0.9 % IV SOLN
INTRAVENOUS | Status: DC
  Filled 2024-07-28: qty 250

## 2024-07-28 MED ORDER — DEXAMETHASONE SOD PHOSPHATE PF 10 MG/ML IJ SOLN
10.0000 mg | Freq: Once | INTRAMUSCULAR | Status: AC
  Administered 2024-07-28: 10 mg via INTRAVENOUS
  Filled 2024-07-28: qty 1

## 2024-07-28 MED ORDER — SODIUM CHLORIDE 0.9 % IV SOLN
375.0000 mg/m2 | Freq: Once | INTRAVENOUS | Status: AC
  Administered 2024-07-28: 700 mg via INTRAVENOUS
  Filled 2024-07-28: qty 20

## 2024-07-28 MED ORDER — SODIUM CHLORIDE 0.9 % IV SOLN
90.0000 mg/m2 | Freq: Once | INTRAVENOUS | Status: AC
  Administered 2024-07-28: 160 mg via INTRAVENOUS
  Filled 2024-07-28: qty 6.4

## 2024-07-28 MED ORDER — ACETAMINOPHEN 325 MG PO TABS
650.0000 mg | ORAL_TABLET | Freq: Once | ORAL | Status: AC
  Administered 2024-07-28: 650 mg via ORAL
  Filled 2024-07-28: qty 2

## 2024-07-28 NOTE — Assessment & Plan Note (Addendum)
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  Coexpression of CLL.  MYD 88 -??; [place]- Blood- K ras* OCT 2025-PET scan: extensive hypermetabolic perilymphatic nodularity with nodular and masslike areas of consolidation in the lungs as well as borderline hypermetabolic mediastinal lymph nodes and hypermetabolic abdominal retroperitoneal and mesenteric lymph nodes. Currently on Benda-Rituximab   # Proceed with cycle  #3 Bendamustine -rituximab -Bendamustine  day 2 every 28-day cycle.  Clinically improved post cycle #1-with improvement of cough.  Tolerating fairly well. Will plan a PET scan after 3 cycle; ordered today  # CKD- ? Stage III- recommend increase PO fluids-    # Oral mucositis- improved with magic mouth wash-   # Iron  deficiency anemia-?  Hiatal hernia.  Monitor for now  # Anxiety: Monitor closely on chemotherapy.  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   # IV access: sp port placement.    # Vaccination: Status post flu vac- dec 2025  # DISPOSITION: #  in 4 weeks- MD; port lab- cbc/cmp; benda-Ritux; LDH;; D-2 benda-; PET prior-Dr.B "

## 2024-07-28 NOTE — Progress Notes (Signed)
 [facility] CONSULT NOTE  Patient Care Team: Alla Amis, [name], [name], [name], [name]  CHIEF COMPLAINTS/PURPOSE OF CONSULTATION: Lymphoma   HEMATOLOGY HISTORY  # ANEMIA[Hb; MCV-platelets- WBC; Iron  sat; ferritin;  GFR- CT/US ; EGD/colonoscopy-  Oncology History   No history exists.   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  Coexpression of CLL.  MYD 88 pending OCT 2025-PET scan: extensive hypermetabolic perilymphatic nodularity with nodular and masslike areas of consolidation in the lungs as well as borderline hypermetabolic mediastinal lymph nodes and hypermetabolic abdominal retroperitoneal and mesenteric lymph nodes.   # NOV 17th, 2025-Bendamustine -rituximab -Bendamustine  day 2 every 28-day cycle.    HISTORY OF PRESENTING ILLNESS: Patient ambulating-independently. With husband.    Kaylee Gordon 64 y.o.  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  History of Present Illness   Kaylee Gordon is a 64 year old female with low grade B-cell lymphoproliferative disorder undergoing chemotherapy who presents for hematology/oncology follow-up to assess treatment response and manage chemotherapy-related adverse effects.  She reports gastrointestinal symptoms following each cycle, typically beginning approximately 10 days post-infusion. Symptoms include initial constipation followed by several days  of diarrhea, most recently lasting from Friday through Sunday of last week, during which she was unable to tolerate oral intake and developed dehydration. She is uncertain whether these symptoms are attributable to chemotherapy or her underlying irritable bowel syndrome. Administration of loperamide resulted in significant improvement of her diarrhea.  She reports a transient, generalized malaise following chemotherapy, which subsequently resolves. She previously had a persistent cough, which has now resolved.  She expresses ongoing anxiety and fear regarding her disease status and prognosis, including concerns about disease progression and mortality. She seeks reassurance regarding her response to treatment and expresses guilt about her worries. She is also concerned about contracting influenza while immunosuppressed, despite having received the influenza vaccine. She has not had influenza this year. Her son recently returned home with a viral illness (not influenza), prompting her to take precautions to avoid exposure.       Review of Systems  Constitutional:  Positive for malaise/fatigue. Negative for chills, diaphoresis, fever and weight loss.  HENT:  Negative for nosebleeds and sore throat.   Eyes:  Negative for double vision.  Respiratory:  Negative for cough, hemoptysis, sputum production, shortness of breath and wheezing.   Cardiovascular:  Negative for chest pain, palpitations, orthopnea and leg swelling.  Gastrointestinal:  Negative for abdominal pain, blood in stool, constipation, diarrhea, heartburn, melena, nausea and vomiting.  Genitourinary:  Negative for dysuria, frequency and urgency.  Musculoskeletal:  Positive for back pain and joint pain.  Skin: Negative.  Negative for itching and rash.  Neurological:  Negative for dizziness, tingling, focal weakness, weakness and headaches.  Endo/Heme/Allergies:  Does not bruise/bleed easily.  Psychiatric/Behavioral:  Negative for depression.  The patient is not nervous/anxious and does not have insomnia.      MEDICAL HISTORY:  Past Medical History:  Diagnosis Date   Anxiety    Asthma    B12 deficiency    Bipolar disorder (  HCC)    Borderline diabetes    CHF (congestive heart failure) (HCC)    Colon polyps    Depression    Dyspnea    Family history of adverse reaction to anesthesia    mom and dad-delayed emergence   Fibromyalgia    Hilar adenopathy    History of hiatal hernia    History of melanoma    History of methicillin resistant staphylococcus aureus (MRSA) 2010   Hypertension    Hypoparathyroidism    Hypothyroidism    IDA (iron  deficiency anemia)    Lung nodules    Mediastinal adenopathy    PONV (postoperative nausea and vomiting)    delayed emergence   Prolonged Q-T interval on ECG    Thyroid  disease     SURGICAL HISTORY: Past Surgical History:  Procedure Laterality Date   ANKLE SURGERY Right 2012   APPENDECTOMY     AUGMENTATION MAMMAPLASTY Bilateral    CESAREAN SECTION     x2   COLONOSCOPY WITH PROPOFOL  N/A 02/22/2015   Procedure: COLONOSCOPY WITH PROPOFOL ;  Surgeon: Donnice Vaughn Manes, MD;  Location: [facility] ENDOSCOPY;  Service: Endoscopy;  Laterality: N/A;   COLONOSCOPY WITH PROPOFOL  N/A 08/08/2022   Procedure: COLONOSCOPY WITH PROPOFOL ;  Surgeon: Maryruth Ole DASEN, MD;  Location: [facility] ENDOSCOPY;  Service: Endoscopy;  Laterality: N/A;   ESOPHAGOGASTRODUODENOSCOPY (EGD) WITH PROPOFOL  N/A 02/22/2015   Procedure: ESOPHAGOGASTRODUODENOSCOPY (EGD) WITH PROPOFOL ;  Surgeon: Donnice Vaughn Manes, MD;  Location: [facility] ENDOSCOPY;  Service: Endoscopy;  Laterality: N/A;   ESOPHAGOGASTRODUODENOSCOPY (EGD) WITH PROPOFOL  N/A 02/11/2016   Procedure: ESOPHAGOGASTRODUODENOSCOPY (EGD) WITH PROPOFOL ;  Surgeon: Lamar DASEN Holmes, MD;  Location: [facility] ENDOSCOPY;  Service: Endoscopy;  Laterality: N/A;   ESOPHAGOGASTRODUODENOSCOPY (EGD) WITH PROPOFOL  N/A 08/08/2022   Procedure: ESOPHAGOGASTRODUODENOSCOPY (EGD) WITH  PROPOFOL ;  Surgeon: Maryruth Ole DASEN, MD;  Location: [facility] ENDOSCOPY;  Service: Endoscopy;  Laterality: N/A;   HEMORRHOID SURGERY     IR IMAGING GUIDED PORT INSERTION  05/22/2024   NASAL SINUS SURGERY     SAVORY DILATION N/A 02/22/2015   Procedure: SAVORY DILATION;  Surgeon: Donnice Vaughn Manes, MD;  Location: [facility] ENDOSCOPY;  Service: Endoscopy;  Laterality: N/A;   SAVORY DILATION N/A 02/11/2016   Procedure: SAVORY DILATION;  Surgeon: Lamar DASEN Holmes, MD;  Location: [facility] ENDOSCOPY;  Service: Endoscopy;  Laterality: N/A;   SKIN CANCER EXCISION     VIDEO BRONCHOSCOPY WITH ENDOBRONCHIAL NAVIGATION N/A 04/11/2024   Procedure: VIDEO BRONCHOSCOPY WITH ENDOBRONCHIAL NAVIGATION;  Surgeon: Parris Manna, MD;  Location: [facility] ORS;  Service: Thoracic;  Laterality: N/A;   VIDEO BRONCHOSCOPY WITH ENDOBRONCHIAL ULTRASOUND N/A 04/11/2024   Procedure: BRONCHOSCOPY, WITH EBUS;  Surgeon: Parris Manna, MD;  Location: [facility] ORS;  Service: Thoracic;  Laterality: N/A;    SOCIAL HISTORY: Social History   Socioeconomic History   Marital status: Married    Spouse name: Ozell   Number of children: Not on file   Years of education: Not on file   Highest education level: Not on file  Occupational History   Not on file  Tobacco Use   Smoking status: Never   Smokeless tobacco: Never  Vaping Use   Vaping status: Never Used  Substance and Sexual Activity   Alcohol use: Yes    Comment: rare wine   Drug use: No   Sexual activity: Not Currently    Partners: Male  Other Topics Concern   Not on file  Social History Narrative   Lives in [place]; taught high school- math; retd; no smoking; social alcohol.  Social Drivers of Health   Tobacco Use: Low Risk (07/28/2024)   Patient History    Smoking Tobacco Use: Never    Smokeless Tobacco Use: Never    Passive Exposure: Not on file  Financial Resource Strain: Low Risk  (08/07/2023)   Received from [facility]   Overall Financial  Resource Strain (CARDIA)    Difficulty of Paying Living Expenses: Not hard at all  Food Insecurity: No Food Insecurity (02/25/2024)   Epic    Worried About Running Out of Food in the Last Year: Never true    Ran Out of Food in the Last Year: Never true  Transportation Needs: No Transportation Needs (02/25/2024)   Epic    Lack of Transportation (Medical): No    Lack of Transportation (Non-Medical): No  Physical Activity: Not on file  Stress: Not on file  Social Connections: Not on file  Intimate Partner Violence: Not At Risk (02/25/2024)   Epic    Fear of Current or Ex-Partner: No    Emotionally Abused: No    Physically Abused: No    Sexually Abused: No  Depression (PHQ2-9): High Risk (07/22/2024)   Depression (PHQ2-9)    PHQ-2 Score: 12  Alcohol Screen: Not on file  Housing: Low Risk  (04/16/2024)   Received from [facility]   Epic    In the last 12 months, was there a time when you were not able to pay the mortgage or rent on time?: No    In the past 12 months, how many times have you moved where you were living?: 0    At any time in the past 12 months, were you homeless or living in a shelter (including now)?: No  Utilities: Not At Risk (02/25/2024)   Epic    Threatened with loss of utilities: No  Health Literacy: Not on file    FAMILY HISTORY: Family History  Problem Relation Age of Onset   Cancer Mother        colon cancer   Depression Mother    Anxiety disorder Mother    Heart disease Father    Cancer Father        ? neck cancer- still living.    Anxiety disorder Father    Depression Father    Colon cancer Maternal Aunt        died in 37s.    Hypoparathyroidism Son     ALLERGIES:  is allergic to amoxicillin , chlorzoxazone, levofloxacin, atorvastatin, bee pollen, hydrochlorothiazide, pollen extract, and pseudoephedrine.  MEDICATIONS:  Current Outpatient Medications  Medication Sig Dispense Refill   acyclovir  (ZOVIRAX ) 400 MG tablet Take 1 tablet  (400 mg total) by mouth daily for 90 doses. 90 tablet 0   allopurinol  (ZYLOPRIM ) 300 MG tablet Take 1 tablet (300 mg total) by mouth daily for 180 doses. 90 tablet 1   benzonatate  (TESSALON ) 100 MG capsule Take 1 capsule (100 mg total) by mouth 3 (three) times daily as needed for cough. 20 capsule 0   busPIRone  (BUSPAR ) 10 MG tablet TAKE 1 TABLET BY MOUTH 3 TIMES A DAY * MAY TAKE 1 EXTRA TABLET AS NEEDED FOR SEVERE ANXIETY* 120 tablet 2   calcitRIOL  (ROCALTROL ) 0.25 MCG capsule Take 1 capsule (0.25 mcg total) by mouth daily. 90 capsule 3   escitalopram  (LEXAPRO ) 10 MG tablet TAKE 1.5 TABLET BY MOUTH DAILY 135 tablet 1   esomeprazole (NEXIUM) 40 MG capsule Take 1 capsule by mouth every morning.     fenofibrate  160 MG tablet Take 1 tablet by mouth daily.     fluticasone-salmeterol (ADVAIR) 250-50 MCG/ACT AEPB Inhale 1 puff into the lungs in the morning and at bedtime.     gabapentin  (NEURONTIN ) 300 MG capsule Take 300 mg by mouth See admin instructions. 2 CAP AM, 1 CAP Q noon, 2 CAP pm,     ipratropium (ATROVENT ) 0.03 % nasal spray Place 1 spray into both nostrils 2 (two) times daily.     isosorbide mononitrate (IMDUR) 30 MG 24 hr tablet Take 30 mg by mouth 2 (two) times daily.     lamoTRIgine  (LAMICTAL ) 200 MG tablet Take 0.5 tablets (100 mg total) by mouth 2 (two) times daily. 90 tablet 1   lamoTRIgine  (LAMICTAL ) 25 MG tablet TAKE 1 TABLET BY MOUTH DAILY *TAKE ALONG WITH 200 MG DAILY FOR A TOTAL OF 225MG * 30 tablet 0   levothyroxine  (SYNTHROID ) 75 MCG tablet Take 1 tablet (75 mcg total) by mouth daily. 90 tablet 3   lidocaine -prilocaine  (EMLA ) cream Apply 1 Application topically as needed. 30 g 1   lisinopril (ZESTRIL) 10 MG tablet Take 10 mg by mouth daily.     lovastatin (MEVACOR) 20 MG tablet Take 20 mg by mouth at bedtime.     magic mouthwash w/lidocaine  SOLN Take 5 mLs by mouth 4 (four) times daily. 480 mL 3   montelukast (SINGULAIR) 10 MG tablet Take 10 mg by mouth at bedtime.      ondansetron  (ZOFRAN ) 8 MG tablet Take 1 tablet (8 mg total) by mouth every 8 (eight) hours as needed for nausea or vomiting (nausea related to iron  infusions). 60 tablet 0   prazosin  (MINIPRESS ) 2 MG capsule Take 1 capsule (2 mg total) by mouth at bedtime. 90 capsule 1   prochlorperazine  (COMPAZINE ) 10 MG tablet Take 1 tablet (10 mg total) by mouth every 6 (six) hours as needed for nausea or vomiting. 30 tablet 1   propranolol ER (INDERAL LA) 80 MG 24 hr capsule Take 80 mg by mouth every morning.     RESTASIS 0.05 % ophthalmic emulsion Place 1 drop into both eyes 2 (two) times daily.     rOPINIRole  (REQUIP ) 0.25 MG tablet TAKE 1 TABLET BY MOUTH 3 TIMES A DAY 90 tablet 5   temazepam  (RESTORIL ) 30 MG capsule Take 1 capsule (30 mg total) by mouth at bedtime. 30 capsule 2   TRYPTYR 0.003 % SOLN Apply 1 drop to eye 2 (two) times daily.     No current facility-administered medications for this visit.   PHYSICAL EXAMINATION:   Vitals:   07/28/24 0849  BP: 106/65  Pulse: 72  Resp: 20  Temp: 98.6 F (37 C)  SpO2: 100%   Filed Weights   07/28/24 0849  Weight: 159 lb 8 oz (72.3 kg)     Physical Exam Vitals and nursing note reviewed.  HENT:     Head: Normocephalic and atraumatic.     Mouth/Throat:     Pharynx: Oropharynx is clear.  Eyes:     Extraocular Movements: Extraocular movements intact.     Pupils: Pupils are equal, round, and reactive to light.  Cardiovascular:     Rate and Rhythm: Normal rate and regular rhythm.  Pulmonary:     Comments: Decreased breath sounds bilaterally.  Abdominal:     Palpations: Abdomen is soft.  Musculoskeletal:        General: Normal range of motion.     Cervical back: Normal range of motion.  Skin:  General: Skin is warm.  Neurological:     General: No focal deficit present.     Mental Status: She is alert and oriented to person, place, and time.  Psychiatric:        Behavior: Behavior normal.        Judgment: Judgment normal.       LABORATORY DATA:  I have reviewed the data as listed Lab Results  Component Value Date   WBC 4.5 07/28/2024   HGB 10.4 (L) 07/28/2024   HCT 32.1 (L) 07/28/2024   MCV 84.7 07/28/2024   PLT 236 07/28/2024   Recent Labs    06/15/24 1516 06/30/24 0851 07/28/24 0847  NA 139 142 140  K 3.9 3.7 3.8  CL 105 106 105  CO2 22 24 25   GLUCOSE 100* 124* 130*  BUN 19 14 18   CREATININE 0.97 1.25* 1.08*  CALCIUM 9.0 9.4 9.4  GFRNONAA >60 48* 57*  PROT 6.5 6.7 6.6  ALBUMIN 4.2 4.4 4.4  AST 56* 20 28  ALT 47* 17 27  ALKPHOS 98 74 77  BILITOT <0.2 0.3 <0.2     No results found.   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  Coexpression of CLL.  MYD 88 -??; [place]- Blood- K ras* OCT 2025-PET scan: extensive hypermetabolic perilymphatic nodularity with nodular and masslike areas of consolidation in the lungs as well as borderline hypermetabolic mediastinal lymph nodes and hypermetabolic abdominal retroperitoneal and mesenteric lymph nodes. Currently on Benda-Rituximab   # Proceed with cycle  #3 Bendamustine -rituximab -Bendamustine  day 2 every 28-day cycle.  Clinically improved post cycle #1-with improvement of cough.  Tolerating fairly well. Will plan a PET scan after 3 cycle; ordered today  # CKD- ? Stage III- recommend increase PO fluids-    # Oral mucositis- improved with magic mouth wash-   # Iron  deficiency anemia-?  Hiatal hernia.  Monitor for now  # Anxiety: Monitor closely on chemotherapy.  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   # IV access: sp port placement.    # Vaccination: Status post flu vac- dec 2025  # DISPOSITION: #  in 4 weeks- MD; port lab- cbc/cmp; benda-Ritux; LDH;; D-2 benda-; PET prior-Dr.B      Cindy JONELLE Joe,  MD 07/28/2024 9:52 AM

## 2024-07-28 NOTE — Patient Instructions (Signed)
 [facility] - A DEPT OF [facility]  Discharge Instructions: Thank you for choosing [facility] to provide your oncology and hematology care.  If you have a lab appointment with the Cancer Center, please go directly to the Cancer Center and check in at the registration area.  Wear comfortable clothing and clothing appropriate for easy access to any Portacath or PICC line.   We strive to give you quality time with your provider. You may need to reschedule your appointment if you arrive late (15 or more minutes).  Arriving late affects you and other patients whose appointments are after yours.  Also, if you miss three or more appointments without notifying the office, you may be dismissed from the clinic at the providers discretion.      For prescription refill requests, have your pharmacy contact our office and allow 72 hours for refills to be completed.    Today you received the following chemotherapy and/or immunotherapy agents: riTUXimab -pvvr (RUXIENCE ) and bendamustine  (BENDEKA )       To help prevent nausea and vomiting after your treatment, we encourage you to take your nausea medication as directed.  BELOW ARE SYMPTOMS THAT SHOULD BE REPORTED IMMEDIATELY: *FEVER GREATER THAN 100.4 F (38 C) OR HIGHER *CHILLS OR SWEATING *NAUSEA AND VOMITING THAT IS NOT CONTROLLED WITH YOUR NAUSEA MEDICATION *UNUSUAL SHORTNESS OF BREATH *UNUSUAL BRUISING OR BLEEDING *URINARY PROBLEMS (pain or burning when urinating, or frequent urination) *BOWEL PROBLEMS (unusual diarrhea, constipation, pain near the anus) TENDERNESS IN MOUTH AND THROAT WITH OR WITHOUT PRESENCE OF ULCERS (sore throat, sores in mouth, or a toothache) UNUSUAL RASH, SWELLING OR PAIN  UNUSUAL VAGINAL DISCHARGE OR ITCHING   Items with * indicate a potential emergency and should be followed up as soon as possible or go to the Emergency Department if any problems should occur.  Please show  the CHEMOTHERAPY ALERT CARD or IMMUNOTHERAPY ALERT CARD at check-in to the Emergency Department and triage nurse.  Should you have questions after your visit or need to cancel or reschedule your appointment, please contact [facility] - A DEPT OF [facility]  [phone] and follow the prompts.  Office hours are 8:00 a.m. to 4:30 p.m. Monday - Friday. Please note that voicemails left after 4:00 p.m. may not be returned until the following business day.  We are closed weekends and major holidays. You have access to a nurse at all times for urgent questions. Please call the main number to the clinic [phone] and follow the prompts.  For any non-urgent questions, you may also contact your provider using MyChart. We now offer e-Visits for anyone 60 and older to request care online for non-urgent symptoms. For details visit [email or web address].   Also download the MyChart app! Go to the app store, search MyChart, open the app, select , and log in with your MyChart username and password.

## 2024-07-28 NOTE — Progress Notes (Signed)
 Patient has no concerns. Patient did mention that her stomach hurts her all the time. She states she does have IBS but doesn't know if its that or the chemo.

## 2024-07-29 ENCOUNTER — Inpatient Hospital Stay

## 2024-07-29 VITALS — BP 97/57 | HR 77 | Temp 97.4°F | Resp 18

## 2024-07-29 DIAGNOSIS — Z5112 Encounter for antineoplastic immunotherapy: Secondary | ICD-10-CM | POA: Diagnosis not present

## 2024-07-29 DIAGNOSIS — D47Z9 Other specified neoplasms of uncertain behavior of lymphoid, hematopoietic and related tissue: Secondary | ICD-10-CM

## 2024-07-29 MED ORDER — DEXAMETHASONE SOD PHOSPHATE PF 10 MG/ML IJ SOLN
10.0000 mg | Freq: Once | INTRAMUSCULAR | Status: AC
  Administered 2024-07-29: 10 mg via INTRAVENOUS
  Filled 2024-07-29: qty 1

## 2024-07-29 MED ORDER — SODIUM CHLORIDE 0.9 % IV SOLN
INTRAVENOUS | Status: DC
  Filled 2024-07-29: qty 250

## 2024-07-29 MED ORDER — SODIUM CHLORIDE 0.9 % IV SOLN
90.0000 mg/m2 | Freq: Once | INTRAVENOUS | Status: AC
  Administered 2024-07-29: 160 mg via INTRAVENOUS
  Filled 2024-07-29: qty 6.4

## 2024-08-16 ENCOUNTER — Other Ambulatory Visit: Payer: Self-pay | Admitting: Psychiatry

## 2024-08-16 DIAGNOSIS — F3131 Bipolar disorder, current episode depressed, mild: Secondary | ICD-10-CM

## 2024-08-18 ENCOUNTER — Telehealth: Admitting: Endocrinology

## 2024-08-19 ENCOUNTER — Encounter: Payer: Self-pay | Admitting: Endocrinology

## 2024-08-19 ENCOUNTER — Telehealth: Admitting: Endocrinology

## 2024-08-19 VITALS — Wt 155.0 lb

## 2024-08-19 DIAGNOSIS — E559 Vitamin D deficiency, unspecified: Secondary | ICD-10-CM

## 2024-08-19 DIAGNOSIS — E200 Idiopathic hypoparathyroidism: Secondary | ICD-10-CM

## 2024-08-19 DIAGNOSIS — E039 Hypothyroidism, unspecified: Secondary | ICD-10-CM

## 2024-08-20 ENCOUNTER — Ambulatory Visit: Admission: RE | Admit: 2024-08-20 | Source: Ambulatory Visit

## 2024-08-20 DIAGNOSIS — D47Z9 Other specified neoplasms of uncertain behavior of lymphoid, hematopoietic and related tissue: Secondary | ICD-10-CM

## 2024-08-20 LAB — GLUCOSE, CAPILLARY: Glucose-Capillary: 106 mg/dL — ABNORMAL HIGH (ref 70–99)

## 2024-08-20 MED ORDER — FLUDEOXYGLUCOSE F - 18 (FDG) INJECTION
8.3300 | Freq: Once | INTRAVENOUS | Status: AC | PRN
  Administered 2024-08-20: 8.33 via INTRAVENOUS

## 2024-08-25 ENCOUNTER — Inpatient Hospital Stay

## 2024-08-25 ENCOUNTER — Other Ambulatory Visit

## 2024-08-25 ENCOUNTER — Inpatient Hospital Stay: Admitting: Internal Medicine

## 2024-08-25 ENCOUNTER — Inpatient Hospital Stay: Attending: Internal Medicine

## 2024-08-26 ENCOUNTER — Inpatient Hospital Stay

## 2024-10-08 ENCOUNTER — Telehealth: Admitting: Psychiatry
